# Patient Record
Sex: Female | Born: 1960 | Race: White | Hispanic: No | Smoking: Former smoker
Health system: Southern US, Community
[De-identification: ages and names within clinical notes are randomized; demographics above are authoritative.]

## PROBLEM LIST (undated history)

## (undated) DIAGNOSIS — Z5189 Encounter for other specified aftercare: Secondary | ICD-10-CM

## (undated) DIAGNOSIS — K449 Diaphragmatic hernia without obstruction or gangrene: Secondary | ICD-10-CM

## (undated) DIAGNOSIS — IMO0002 Reserved for concepts with insufficient information to code with codable children: Secondary | ICD-10-CM

## (undated) DIAGNOSIS — F329 Major depressive disorder, single episode, unspecified: Secondary | ICD-10-CM

## (undated) DIAGNOSIS — D689 Coagulation defect, unspecified: Secondary | ICD-10-CM

## (undated) DIAGNOSIS — J90 Pleural effusion, not elsewhere classified: Secondary | ICD-10-CM

## (undated) DIAGNOSIS — K219 Gastro-esophageal reflux disease without esophagitis: Secondary | ICD-10-CM

## (undated) DIAGNOSIS — IMO0001 Reserved for inherently not codable concepts without codable children: Secondary | ICD-10-CM

## (undated) DIAGNOSIS — M1990 Unspecified osteoarthritis, unspecified site: Secondary | ICD-10-CM

## (undated) DIAGNOSIS — K7460 Unspecified cirrhosis of liver: Secondary | ICD-10-CM

## (undated) DIAGNOSIS — R188 Other ascites: Secondary | ICD-10-CM

## (undated) DIAGNOSIS — K222 Esophageal obstruction: Secondary | ICD-10-CM

## (undated) DIAGNOSIS — F0280 Dementia in other diseases classified elsewhere without behavioral disturbance: Secondary | ICD-10-CM

## (undated) DIAGNOSIS — K7290 Hepatic failure, unspecified without coma: Secondary | ICD-10-CM

## (undated) DIAGNOSIS — B182 Chronic viral hepatitis C: Secondary | ICD-10-CM

## (undated) DIAGNOSIS — F1011 Alcohol abuse, in remission: Secondary | ICD-10-CM

## (undated) DIAGNOSIS — S0990XS Unspecified injury of head, sequela: Secondary | ICD-10-CM

## (undated) DIAGNOSIS — K7682 Hepatic encephalopathy: Secondary | ICD-10-CM

## (undated) DIAGNOSIS — I8500 Esophageal varices without bleeding: Secondary | ICD-10-CM

## (undated) DIAGNOSIS — I10 Essential (primary) hypertension: Secondary | ICD-10-CM

## (undated) DIAGNOSIS — Z8781 Personal history of (healed) traumatic fracture: Secondary | ICD-10-CM

## (undated) DIAGNOSIS — F4001 Agoraphobia with panic disorder: Secondary | ICD-10-CM

## (undated) DIAGNOSIS — F419 Anxiety disorder, unspecified: Secondary | ICD-10-CM

## (undated) HISTORY — DX: Unspecified injury of head, sequela: S09.90XS

## (undated) HISTORY — DX: Unspecified osteoarthritis, unspecified site: M19.90

## (undated) HISTORY — DX: Hepatic encephalopathy: K76.82

## (undated) HISTORY — DX: Diaphragmatic hernia without obstruction or gangrene: K44.9

## (undated) HISTORY — DX: Alcohol abuse, in remission: F10.11

## (undated) HISTORY — DX: Esophageal obstruction: K22.2

## (undated) HISTORY — DX: Reserved for concepts with insufficient information to code with codable children: IMO0002

## (undated) HISTORY — PX: NASAL SINUS SURGERY: SHX719

## (undated) HISTORY — DX: Agoraphobia with panic disorder: F40.01

## (undated) HISTORY — DX: Dementia in other diseases classified elsewhere without behavioral disturbance: F02.80

## (undated) HISTORY — DX: Hepatic failure, unspecified without coma: K72.90

## (undated) HISTORY — DX: Gastro-esophageal reflux disease without esophagitis: K21.9

## (undated) HISTORY — DX: Personal history of (healed) traumatic fracture: Z87.81

## (undated) HISTORY — DX: Encounter for other specified aftercare: Z51.89

## (undated) HISTORY — DX: Reserved for inherently not codable concepts without codable children: IMO0001

## (undated) HISTORY — DX: Essential (primary) hypertension: I10

## (undated) HISTORY — PX: SPLENECTOMY: SUR1306

## (undated) HISTORY — DX: Coagulation defect, unspecified: D68.9

---

## 1998-03-18 ENCOUNTER — Inpatient Hospital Stay (HOSPITAL_COMMUNITY): Admission: EM | Admit: 1998-03-18 | Discharge: 1998-03-23 | Payer: Self-pay | Admitting: Gastroenterology

## 2002-08-24 HISTORY — PX: BURR HOLE FOR SUBDURAL HEMATOMA: SHX1275

## 2002-12-11 ENCOUNTER — Emergency Department (HOSPITAL_COMMUNITY): Admission: EM | Admit: 2002-12-11 | Discharge: 2002-12-11 | Payer: Self-pay | Admitting: Emergency Medicine

## 2002-12-11 ENCOUNTER — Encounter: Payer: Self-pay | Admitting: Emergency Medicine

## 2002-12-25 ENCOUNTER — Inpatient Hospital Stay (HOSPITAL_COMMUNITY): Admission: EM | Admit: 2002-12-25 | Discharge: 2002-12-27 | Payer: Self-pay

## 2003-01-08 ENCOUNTER — Encounter: Admission: RE | Admit: 2003-01-08 | Discharge: 2003-01-08 | Payer: Self-pay | Admitting: Internal Medicine

## 2003-02-28 ENCOUNTER — Encounter: Admission: RE | Admit: 2003-02-28 | Discharge: 2003-02-28 | Payer: Self-pay | Admitting: Internal Medicine

## 2003-03-06 ENCOUNTER — Encounter: Admission: RE | Admit: 2003-03-06 | Discharge: 2003-03-06 | Payer: Self-pay | Admitting: Internal Medicine

## 2003-09-27 ENCOUNTER — Encounter: Admission: RE | Admit: 2003-09-27 | Discharge: 2003-09-27 | Payer: Self-pay | Admitting: Sports Medicine

## 2003-09-27 ENCOUNTER — Other Ambulatory Visit: Admission: RE | Admit: 2003-09-27 | Discharge: 2003-09-27 | Payer: Self-pay | Admitting: Family Medicine

## 2003-10-10 ENCOUNTER — Encounter: Admission: RE | Admit: 2003-10-10 | Discharge: 2003-10-10 | Payer: Self-pay | Admitting: Family Medicine

## 2003-10-16 ENCOUNTER — Ambulatory Visit (HOSPITAL_COMMUNITY): Admission: RE | Admit: 2003-10-16 | Discharge: 2003-10-16 | Payer: Self-pay | Admitting: Sports Medicine

## 2003-10-16 ENCOUNTER — Encounter: Admission: RE | Admit: 2003-10-16 | Discharge: 2003-10-16 | Payer: Self-pay | Admitting: Sports Medicine

## 2003-10-18 ENCOUNTER — Encounter: Admission: RE | Admit: 2003-10-18 | Discharge: 2003-10-18 | Payer: Self-pay | Admitting: Sports Medicine

## 2004-03-07 ENCOUNTER — Emergency Department (HOSPITAL_COMMUNITY): Admission: EM | Admit: 2004-03-07 | Discharge: 2004-03-07 | Payer: Self-pay | Admitting: Emergency Medicine

## 2004-03-27 ENCOUNTER — Emergency Department (HOSPITAL_COMMUNITY): Admission: EM | Admit: 2004-03-27 | Discharge: 2004-03-27 | Payer: Self-pay | Admitting: Emergency Medicine

## 2004-12-02 ENCOUNTER — Ambulatory Visit: Payer: Self-pay | Admitting: Family Medicine

## 2004-12-02 ENCOUNTER — Ambulatory Visit (HOSPITAL_COMMUNITY): Admission: RE | Admit: 2004-12-02 | Discharge: 2004-12-02 | Payer: Self-pay | Admitting: Internal Medicine

## 2004-12-11 ENCOUNTER — Ambulatory Visit: Payer: Self-pay | Admitting: Family Medicine

## 2004-12-25 ENCOUNTER — Ambulatory Visit: Payer: Self-pay | Admitting: Family Medicine

## 2005-01-19 ENCOUNTER — Inpatient Hospital Stay (HOSPITAL_COMMUNITY): Admission: EM | Admit: 2005-01-19 | Discharge: 2005-01-22 | Payer: Self-pay | Admitting: Emergency Medicine

## 2005-01-19 ENCOUNTER — Ambulatory Visit: Payer: Self-pay | Admitting: Family Medicine

## 2005-02-18 ENCOUNTER — Ambulatory Visit: Payer: Self-pay | Admitting: Family Medicine

## 2005-02-23 ENCOUNTER — Ambulatory Visit: Payer: Self-pay | Admitting: Family Medicine

## 2005-03-20 ENCOUNTER — Ambulatory Visit: Payer: Self-pay | Admitting: Family Medicine

## 2005-03-25 ENCOUNTER — Encounter: Admission: RE | Admit: 2005-03-25 | Discharge: 2005-03-25 | Payer: Self-pay | Admitting: Family Medicine

## 2005-04-13 ENCOUNTER — Ambulatory Visit: Payer: Self-pay | Admitting: Family Medicine

## 2005-05-26 ENCOUNTER — Ambulatory Visit: Payer: Self-pay | Admitting: Sports Medicine

## 2005-06-10 ENCOUNTER — Ambulatory Visit: Payer: Self-pay | Admitting: Family Medicine

## 2005-07-14 ENCOUNTER — Ambulatory Visit: Payer: Self-pay | Admitting: Family Medicine

## 2005-08-21 ENCOUNTER — Ambulatory Visit: Payer: Self-pay | Admitting: Family Medicine

## 2005-09-30 ENCOUNTER — Encounter (INDEPENDENT_AMBULATORY_CARE_PROVIDER_SITE_OTHER): Payer: Self-pay | Admitting: *Deleted

## 2005-09-30 ENCOUNTER — Ambulatory Visit: Payer: Self-pay | Admitting: Family Medicine

## 2005-09-30 LAB — CONVERTED CEMR LAB

## 2005-10-05 ENCOUNTER — Ambulatory Visit: Payer: Self-pay | Admitting: Family Medicine

## 2005-10-20 ENCOUNTER — Ambulatory Visit: Payer: Self-pay | Admitting: Family Medicine

## 2005-10-21 ENCOUNTER — Encounter: Admission: RE | Admit: 2005-10-21 | Discharge: 2005-10-21 | Payer: Self-pay | Admitting: Sports Medicine

## 2005-11-18 ENCOUNTER — Ambulatory Visit: Payer: Self-pay | Admitting: Family Medicine

## 2005-12-18 ENCOUNTER — Ambulatory Visit: Payer: Self-pay | Admitting: Family Medicine

## 2005-12-24 ENCOUNTER — Ambulatory Visit: Payer: Self-pay | Admitting: Family Medicine

## 2005-12-31 ENCOUNTER — Ambulatory Visit: Payer: Self-pay | Admitting: Psychology

## 2006-01-15 ENCOUNTER — Ambulatory Visit: Payer: Self-pay | Admitting: Family Medicine

## 2006-02-02 ENCOUNTER — Ambulatory Visit: Payer: Self-pay | Admitting: Internal Medicine

## 2006-02-04 ENCOUNTER — Ambulatory Visit: Payer: Self-pay | Admitting: Internal Medicine

## 2006-02-16 ENCOUNTER — Ambulatory Visit: Payer: Self-pay | Admitting: Family Medicine

## 2006-03-02 ENCOUNTER — Ambulatory Visit: Payer: Self-pay | Admitting: Sports Medicine

## 2006-03-09 ENCOUNTER — Encounter (INDEPENDENT_AMBULATORY_CARE_PROVIDER_SITE_OTHER): Payer: Self-pay | Admitting: Specialist

## 2006-03-09 ENCOUNTER — Ambulatory Visit (HOSPITAL_COMMUNITY): Admission: RE | Admit: 2006-03-09 | Discharge: 2006-03-09 | Payer: Self-pay | Admitting: Internal Medicine

## 2006-03-11 ENCOUNTER — Ambulatory Visit: Payer: Self-pay | Admitting: Internal Medicine

## 2006-03-23 ENCOUNTER — Ambulatory Visit: Payer: Self-pay | Admitting: Family Medicine

## 2006-03-25 ENCOUNTER — Emergency Department (HOSPITAL_COMMUNITY): Admission: EM | Admit: 2006-03-25 | Discharge: 2006-03-25 | Payer: Self-pay | Admitting: Emergency Medicine

## 2006-03-31 ENCOUNTER — Ambulatory Visit: Payer: Self-pay | Admitting: Sports Medicine

## 2006-04-06 ENCOUNTER — Ambulatory Visit: Payer: Self-pay | Admitting: Internal Medicine

## 2006-05-14 ENCOUNTER — Ambulatory Visit: Payer: Self-pay | Admitting: Family Medicine

## 2006-06-18 ENCOUNTER — Ambulatory Visit: Payer: Self-pay | Admitting: Family Medicine

## 2006-06-23 ENCOUNTER — Ambulatory Visit: Payer: Self-pay | Admitting: Family Medicine

## 2006-07-26 ENCOUNTER — Ambulatory Visit: Payer: Self-pay | Admitting: Sports Medicine

## 2006-08-06 ENCOUNTER — Ambulatory Visit: Payer: Self-pay

## 2006-09-03 ENCOUNTER — Encounter (INDEPENDENT_AMBULATORY_CARE_PROVIDER_SITE_OTHER): Payer: Self-pay | Admitting: Family Medicine

## 2006-09-03 ENCOUNTER — Ambulatory Visit: Payer: Self-pay | Admitting: Family Medicine

## 2006-10-21 DIAGNOSIS — D696 Thrombocytopenia, unspecified: Secondary | ICD-10-CM

## 2006-10-21 DIAGNOSIS — F331 Major depressive disorder, recurrent, moderate: Secondary | ICD-10-CM

## 2006-10-21 DIAGNOSIS — I10 Essential (primary) hypertension: Secondary | ICD-10-CM | POA: Insufficient documentation

## 2006-10-21 DIAGNOSIS — L299 Pruritus, unspecified: Secondary | ICD-10-CM | POA: Insufficient documentation

## 2006-10-21 DIAGNOSIS — K7460 Unspecified cirrhosis of liver: Secondary | ICD-10-CM

## 2006-10-22 ENCOUNTER — Encounter (INDEPENDENT_AMBULATORY_CARE_PROVIDER_SITE_OTHER): Payer: Self-pay | Admitting: *Deleted

## 2006-10-29 ENCOUNTER — Telehealth (INDEPENDENT_AMBULATORY_CARE_PROVIDER_SITE_OTHER): Payer: Self-pay | Admitting: Family Medicine

## 2006-11-02 ENCOUNTER — Ambulatory Visit: Payer: Self-pay | Admitting: Family Medicine

## 2006-11-02 ENCOUNTER — Encounter (INDEPENDENT_AMBULATORY_CARE_PROVIDER_SITE_OTHER): Payer: Self-pay | Admitting: Family Medicine

## 2006-11-02 LAB — CONVERTED CEMR LAB: Alcohol, Ethyl (B): 9 mg/dL (ref 0–10)

## 2006-11-29 ENCOUNTER — Encounter: Payer: Self-pay | Admitting: Family Medicine

## 2006-12-02 ENCOUNTER — Ambulatory Visit: Payer: Self-pay | Admitting: Family Medicine

## 2006-12-02 ENCOUNTER — Encounter (INDEPENDENT_AMBULATORY_CARE_PROVIDER_SITE_OTHER): Payer: Self-pay | Admitting: Family Medicine

## 2006-12-02 LAB — CONVERTED CEMR LAB: Alcohol, Ethyl (B): 11 mg/dL — ABNORMAL HIGH (ref 0–10)

## 2006-12-07 ENCOUNTER — Ambulatory Visit: Payer: Self-pay | Admitting: Family Medicine

## 2006-12-07 ENCOUNTER — Encounter (INDEPENDENT_AMBULATORY_CARE_PROVIDER_SITE_OTHER): Payer: Self-pay | Admitting: Family Medicine

## 2006-12-07 LAB — CONVERTED CEMR LAB
Amphetamine Screen, Ur: NEGATIVE
Cocaine Metabolites: NEGATIVE
Creatinine,U: 87 mg/dL
Phencyclidine (PCP): NEGATIVE

## 2006-12-11 ENCOUNTER — Encounter (INDEPENDENT_AMBULATORY_CARE_PROVIDER_SITE_OTHER): Payer: Self-pay | Admitting: Family Medicine

## 2007-01-06 ENCOUNTER — Encounter (INDEPENDENT_AMBULATORY_CARE_PROVIDER_SITE_OTHER): Payer: Self-pay | Admitting: *Deleted

## 2007-01-11 ENCOUNTER — Ambulatory Visit: Payer: Self-pay | Admitting: Family Medicine

## 2007-01-11 ENCOUNTER — Encounter (INDEPENDENT_AMBULATORY_CARE_PROVIDER_SITE_OTHER): Payer: Self-pay | Admitting: Family Medicine

## 2007-01-11 LAB — CONVERTED CEMR LAB
Amphetamine Screen, Ur: NEGATIVE
Benzodiazepines.: NEGATIVE
Cocaine Metabolites: NEGATIVE
Phencyclidine (PCP): NEGATIVE

## 2007-02-03 ENCOUNTER — Telehealth (INDEPENDENT_AMBULATORY_CARE_PROVIDER_SITE_OTHER): Payer: Self-pay | Admitting: Family Medicine

## 2007-02-07 ENCOUNTER — Ambulatory Visit: Payer: Self-pay | Admitting: Family Medicine

## 2007-02-07 LAB — CONVERTED CEMR LAB
Bilirubin Urine: NEGATIVE
Blood in Urine, dipstick: NEGATIVE
Glucose, Urine, Semiquant: NEGATIVE
Ketones, urine, test strip: NEGATIVE
Protein, U semiquant: NEGATIVE
pH: 7

## 2007-03-04 ENCOUNTER — Telehealth: Payer: Self-pay | Admitting: *Deleted

## 2007-03-08 ENCOUNTER — Telehealth: Payer: Self-pay | Admitting: *Deleted

## 2007-03-11 ENCOUNTER — Ambulatory Visit: Payer: Self-pay | Admitting: Family Medicine

## 2007-03-11 ENCOUNTER — Encounter: Payer: Self-pay | Admitting: Family Medicine

## 2007-03-11 LAB — CONVERTED CEMR LAB
ALT: 27 units/L (ref 0–35)
Amphetamine Screen, Ur: NEGATIVE
Benzodiazepines.: NEGATIVE
CO2: 23 meq/L (ref 19–32)
Calcium: 8.8 mg/dL (ref 8.4–10.5)
Chloride: 103 meq/L (ref 96–112)
Creatinine, Ser: 0.78 mg/dL (ref 0.40–1.20)
Glucose, Bld: 45 mg/dL — ABNORMAL LOW (ref 70–99)
HCT: 36.4 % (ref 36.0–46.0)
Hemoglobin: 12.6 g/dL (ref 12.0–15.0)
Methadone: NEGATIVE
Platelets: 224 10*3/uL (ref 150–400)
Propoxyphene: NEGATIVE
RBC: 3.37 M/uL — ABNORMAL LOW (ref 3.87–5.11)
WBC: 9.7 10*3/uL (ref 4.0–10.5)

## 2007-04-07 ENCOUNTER — Telehealth (INDEPENDENT_AMBULATORY_CARE_PROVIDER_SITE_OTHER): Payer: Self-pay | Admitting: *Deleted

## 2007-04-08 ENCOUNTER — Encounter (INDEPENDENT_AMBULATORY_CARE_PROVIDER_SITE_OTHER): Payer: Self-pay | Admitting: Family Medicine

## 2007-04-08 ENCOUNTER — Ambulatory Visit: Payer: Self-pay | Admitting: Family Medicine

## 2007-04-08 LAB — CONVERTED CEMR LAB
Barbiturate Quant, Ur: NEGATIVE
Benzodiazepines.: NEGATIVE
Methadone: NEGATIVE
Opiate Screen, Urine: NEGATIVE
Phencyclidine (PCP): NEGATIVE

## 2007-04-14 ENCOUNTER — Telehealth (INDEPENDENT_AMBULATORY_CARE_PROVIDER_SITE_OTHER): Payer: Self-pay | Admitting: Family Medicine

## 2007-04-22 ENCOUNTER — Telehealth: Payer: Self-pay | Admitting: *Deleted

## 2007-05-12 ENCOUNTER — Encounter (INDEPENDENT_AMBULATORY_CARE_PROVIDER_SITE_OTHER): Payer: Self-pay | Admitting: Family Medicine

## 2007-05-12 ENCOUNTER — Ambulatory Visit: Payer: Self-pay | Admitting: Family Medicine

## 2007-05-12 LAB — CONVERTED CEMR LAB
ALT: 24 units/L (ref 0–35)
Ammonia: 25 umol/L (ref 11–35)
Amphetamine Screen, Ur: NEGATIVE
BUN: 13 mg/dL (ref 6–23)
Barbiturate Quant, Ur: NEGATIVE
Bilirubin, Direct: 0.4 mg/dL — ABNORMAL HIGH (ref 0.0–0.3)
Calcium: 8.3 mg/dL — ABNORMAL LOW (ref 8.4–10.5)
Chloride: 102 meq/L (ref 96–112)
Cocaine Metabolites: NEGATIVE
Creatinine, Ser: 0.81 mg/dL (ref 0.40–1.20)
Folate: 18.6 ng/mL
HCT: 35.9 % — ABNORMAL LOW (ref 36.0–46.0)
Indirect Bilirubin: 0.4 mg/dL (ref 0.0–0.9)
MCHC: 34.5 g/dL (ref 30.0–36.0)
MCV: 107.8 fL — ABNORMAL HIGH (ref 78.0–100.0)
Marijuana Metabolite: NEGATIVE
Methadone: NEGATIVE
Opiate Screen, Urine: NEGATIVE
Platelets: 191 10*3/uL (ref 150–400)
RDW: 15.6 % — ABNORMAL HIGH (ref 11.5–14.0)
Sed Rate: 14 mm/hr (ref 0–22)
TSH: 9.02 microintl units/mL — ABNORMAL HIGH (ref 0.350–5.50)
Vitamin B-12: 1168 pg/mL — ABNORMAL HIGH (ref 211–911)
WBC: 8.9 10*3/uL (ref 4.0–10.5)

## 2007-05-18 ENCOUNTER — Encounter (INDEPENDENT_AMBULATORY_CARE_PROVIDER_SITE_OTHER): Payer: Self-pay | Admitting: Family Medicine

## 2007-05-25 ENCOUNTER — Encounter (INDEPENDENT_AMBULATORY_CARE_PROVIDER_SITE_OTHER): Payer: Self-pay | Admitting: Family Medicine

## 2007-06-03 ENCOUNTER — Telehealth (INDEPENDENT_AMBULATORY_CARE_PROVIDER_SITE_OTHER): Payer: Self-pay | Admitting: *Deleted

## 2007-06-06 ENCOUNTER — Encounter (INDEPENDENT_AMBULATORY_CARE_PROVIDER_SITE_OTHER): Payer: Self-pay | Admitting: Family Medicine

## 2007-06-12 ENCOUNTER — Encounter (INDEPENDENT_AMBULATORY_CARE_PROVIDER_SITE_OTHER): Payer: Self-pay | Admitting: Family Medicine

## 2007-06-13 ENCOUNTER — Ambulatory Visit: Payer: Self-pay | Admitting: Family Medicine

## 2007-06-13 ENCOUNTER — Encounter (INDEPENDENT_AMBULATORY_CARE_PROVIDER_SITE_OTHER): Payer: Self-pay | Admitting: Family Medicine

## 2007-06-13 LAB — CONVERTED CEMR LAB: TSH: 3.551 microintl units/mL (ref 0.350–5.50)

## 2007-06-16 ENCOUNTER — Telehealth (INDEPENDENT_AMBULATORY_CARE_PROVIDER_SITE_OTHER): Payer: Self-pay | Admitting: Family Medicine

## 2007-06-29 ENCOUNTER — Telehealth (INDEPENDENT_AMBULATORY_CARE_PROVIDER_SITE_OTHER): Payer: Self-pay | Admitting: *Deleted

## 2007-07-06 ENCOUNTER — Encounter (INDEPENDENT_AMBULATORY_CARE_PROVIDER_SITE_OTHER): Payer: Self-pay | Admitting: Family Medicine

## 2007-07-06 ENCOUNTER — Ambulatory Visit: Payer: Self-pay | Admitting: Family Medicine

## 2007-07-12 LAB — CONVERTED CEMR LAB
ALT: 27 units/L (ref 0–35)
AST: 36 units/L (ref 0–37)
BUN: 14 mg/dL (ref 6–23)
CO2: 24 meq/L (ref 19–32)
Chloride: 109 meq/L (ref 96–112)
Creatinine, Ser: 0.76 mg/dL (ref 0.40–1.20)
Glucose, Bld: 85 mg/dL (ref 70–99)
Potassium: 4.5 meq/L (ref 3.5–5.3)
Total Protein: 6.6 g/dL (ref 6.0–8.3)

## 2007-07-20 ENCOUNTER — Telehealth (INDEPENDENT_AMBULATORY_CARE_PROVIDER_SITE_OTHER): Payer: Self-pay | Admitting: Family Medicine

## 2007-08-03 ENCOUNTER — Ambulatory Visit: Payer: Self-pay | Admitting: Internal Medicine

## 2007-08-04 ENCOUNTER — Ambulatory Visit (HOSPITAL_COMMUNITY): Admission: RE | Admit: 2007-08-04 | Discharge: 2007-08-04 | Payer: Self-pay | Admitting: Internal Medicine

## 2007-08-05 ENCOUNTER — Encounter: Payer: Self-pay | Admitting: Internal Medicine

## 2007-08-05 ENCOUNTER — Ambulatory Visit: Payer: Self-pay | Admitting: Internal Medicine

## 2007-08-05 DIAGNOSIS — K222 Esophageal obstruction: Secondary | ICD-10-CM

## 2007-08-05 HISTORY — PX: UPPER GASTROINTESTINAL ENDOSCOPY: SHX188

## 2007-08-09 ENCOUNTER — Ambulatory Visit: Payer: Self-pay | Admitting: Family Medicine

## 2007-08-10 ENCOUNTER — Telehealth (INDEPENDENT_AMBULATORY_CARE_PROVIDER_SITE_OTHER): Payer: Self-pay | Admitting: *Deleted

## 2007-08-23 ENCOUNTER — Encounter: Admission: RE | Admit: 2007-08-23 | Discharge: 2007-08-24 | Payer: Self-pay | Admitting: Family Medicine

## 2007-08-25 ENCOUNTER — Encounter: Admission: RE | Admit: 2007-08-25 | Discharge: 2007-10-05 | Payer: Self-pay | Admitting: Family Medicine

## 2007-09-02 ENCOUNTER — Encounter (INDEPENDENT_AMBULATORY_CARE_PROVIDER_SITE_OTHER): Payer: Self-pay | Admitting: Family Medicine

## 2007-09-02 ENCOUNTER — Ambulatory Visit: Payer: Self-pay | Admitting: Family Medicine

## 2007-10-11 ENCOUNTER — Ambulatory Visit: Payer: Self-pay | Admitting: Family Medicine

## 2007-10-11 ENCOUNTER — Encounter (INDEPENDENT_AMBULATORY_CARE_PROVIDER_SITE_OTHER): Payer: Self-pay | Admitting: Family Medicine

## 2007-10-11 ENCOUNTER — Other Ambulatory Visit: Admission: RE | Admit: 2007-10-11 | Discharge: 2007-10-11 | Payer: Self-pay | Admitting: Family Medicine

## 2007-10-18 ENCOUNTER — Encounter (INDEPENDENT_AMBULATORY_CARE_PROVIDER_SITE_OTHER): Payer: Self-pay | Admitting: Family Medicine

## 2007-11-14 ENCOUNTER — Encounter (INDEPENDENT_AMBULATORY_CARE_PROVIDER_SITE_OTHER): Payer: Self-pay | Admitting: *Deleted

## 2007-11-25 ENCOUNTER — Ambulatory Visit: Payer: Self-pay | Admitting: Family Medicine

## 2007-12-05 ENCOUNTER — Telehealth (INDEPENDENT_AMBULATORY_CARE_PROVIDER_SITE_OTHER): Payer: Self-pay | Admitting: Family Medicine

## 2007-12-14 ENCOUNTER — Ambulatory Visit: Payer: Self-pay | Admitting: Family Medicine

## 2007-12-20 ENCOUNTER — Encounter: Admission: RE | Admit: 2007-12-20 | Discharge: 2008-03-19 | Payer: Self-pay | Admitting: Family Medicine

## 2007-12-26 ENCOUNTER — Telehealth: Payer: Self-pay | Admitting: *Deleted

## 2007-12-29 ENCOUNTER — Encounter: Payer: Self-pay | Admitting: Internal Medicine

## 2008-01-02 ENCOUNTER — Telehealth: Payer: Self-pay | Admitting: *Deleted

## 2008-01-02 ENCOUNTER — Encounter: Admission: RE | Admit: 2008-01-02 | Discharge: 2008-01-02 | Payer: Self-pay | Admitting: Sports Medicine

## 2008-01-02 ENCOUNTER — Ambulatory Visit: Payer: Self-pay

## 2008-01-05 ENCOUNTER — Telehealth (INDEPENDENT_AMBULATORY_CARE_PROVIDER_SITE_OTHER): Payer: Self-pay | Admitting: Family Medicine

## 2008-01-06 ENCOUNTER — Telehealth: Payer: Self-pay | Admitting: *Deleted

## 2008-01-13 ENCOUNTER — Ambulatory Visit: Payer: Self-pay | Admitting: Family Medicine

## 2008-01-13 ENCOUNTER — Encounter: Payer: Self-pay | Admitting: *Deleted

## 2008-01-15 ENCOUNTER — Emergency Department (HOSPITAL_COMMUNITY): Admission: EM | Admit: 2008-01-15 | Discharge: 2008-01-15 | Payer: Self-pay | Admitting: Family Medicine

## 2008-01-31 ENCOUNTER — Encounter (INDEPENDENT_AMBULATORY_CARE_PROVIDER_SITE_OTHER): Payer: Self-pay | Admitting: Family Medicine

## 2008-01-31 ENCOUNTER — Telehealth: Payer: Self-pay | Admitting: *Deleted

## 2008-01-31 LAB — CONVERTED CEMR LAB
Cholesterol: 110 mg/dL
Glucose, Bld: 90 mg/dL
HDL: 17 mg/dL
Potassium: 4.3 meq/L
Sodium: 140 meq/L
Triglycerides: 99 mg/dL
VLDL: 20 mg/dL

## 2008-02-01 ENCOUNTER — Ambulatory Visit: Payer: Self-pay | Admitting: Family Medicine

## 2008-02-06 ENCOUNTER — Telehealth: Payer: Self-pay | Admitting: *Deleted

## 2008-02-07 ENCOUNTER — Encounter (INDEPENDENT_AMBULATORY_CARE_PROVIDER_SITE_OTHER): Payer: Self-pay | Admitting: Family Medicine

## 2008-02-09 ENCOUNTER — Telehealth: Payer: Self-pay | Admitting: *Deleted

## 2008-02-10 ENCOUNTER — Telehealth: Payer: Self-pay | Admitting: *Deleted

## 2008-02-10 ENCOUNTER — Ambulatory Visit: Payer: Self-pay | Admitting: Family Medicine

## 2008-02-10 ENCOUNTER — Encounter (INDEPENDENT_AMBULATORY_CARE_PROVIDER_SITE_OTHER): Payer: Self-pay | Admitting: Family Medicine

## 2008-02-10 LAB — CONVERTED CEMR LAB
ALT: 39 units/L — ABNORMAL HIGH (ref 0–35)
AST: 48 units/L — ABNORMAL HIGH (ref 0–37)
Albumin: 2.9 g/dL — ABNORMAL LOW (ref 3.5–5.2)
CO2: 21 meq/L (ref 19–32)
Calcium: 8.6 mg/dL (ref 8.4–10.5)
Chloride: 111 meq/L (ref 96–112)
Glucose, Urine, Semiquant: NEGATIVE
Nitrite: NEGATIVE
Potassium: 4.6 meq/L (ref 3.5–5.3)
Protein, U semiquant: NEGATIVE
Specific Gravity, Urine: 1.02
Total Protein: 6.8 g/dL (ref 6.0–8.3)
WBC Urine, dipstick: NEGATIVE
pH: 6.5

## 2008-02-13 ENCOUNTER — Telehealth: Payer: Self-pay | Admitting: *Deleted

## 2008-02-14 ENCOUNTER — Ambulatory Visit: Payer: Self-pay | Admitting: Sports Medicine

## 2008-02-16 ENCOUNTER — Ambulatory Visit: Payer: Self-pay | Admitting: Family Medicine

## 2008-02-28 ENCOUNTER — Encounter (INDEPENDENT_AMBULATORY_CARE_PROVIDER_SITE_OTHER): Payer: Self-pay | Admitting: Family Medicine

## 2008-02-29 ENCOUNTER — Telehealth (INDEPENDENT_AMBULATORY_CARE_PROVIDER_SITE_OTHER): Payer: Self-pay | Admitting: *Deleted

## 2008-03-12 ENCOUNTER — Ambulatory Visit: Payer: Self-pay | Admitting: Family Medicine

## 2008-03-12 ENCOUNTER — Encounter (INDEPENDENT_AMBULATORY_CARE_PROVIDER_SITE_OTHER): Payer: Self-pay | Admitting: Family Medicine

## 2008-03-13 ENCOUNTER — Encounter (INDEPENDENT_AMBULATORY_CARE_PROVIDER_SITE_OTHER): Payer: Self-pay | Admitting: Family Medicine

## 2008-03-13 LAB — CONVERTED CEMR LAB: Ammonia: 44 umol/L — ABNORMAL HIGH (ref 11–35)

## 2008-03-14 ENCOUNTER — Telehealth (INDEPENDENT_AMBULATORY_CARE_PROVIDER_SITE_OTHER): Payer: Self-pay | Admitting: Family Medicine

## 2008-03-19 ENCOUNTER — Telehealth (INDEPENDENT_AMBULATORY_CARE_PROVIDER_SITE_OTHER): Payer: Self-pay | Admitting: Family Medicine

## 2008-03-21 ENCOUNTER — Telehealth (INDEPENDENT_AMBULATORY_CARE_PROVIDER_SITE_OTHER): Payer: Self-pay | Admitting: Family Medicine

## 2008-03-21 ENCOUNTER — Encounter (INDEPENDENT_AMBULATORY_CARE_PROVIDER_SITE_OTHER): Payer: Self-pay | Admitting: Family Medicine

## 2008-03-25 ENCOUNTER — Inpatient Hospital Stay (HOSPITAL_COMMUNITY): Admission: EM | Admit: 2008-03-25 | Discharge: 2008-04-02 | Payer: Self-pay | Admitting: Emergency Medicine

## 2008-03-28 ENCOUNTER — Ambulatory Visit: Payer: Self-pay | Admitting: Physical Medicine & Rehabilitation

## 2008-03-28 ENCOUNTER — Telehealth (INDEPENDENT_AMBULATORY_CARE_PROVIDER_SITE_OTHER): Payer: Self-pay | Admitting: Family Medicine

## 2008-04-04 ENCOUNTER — Telehealth (INDEPENDENT_AMBULATORY_CARE_PROVIDER_SITE_OTHER): Payer: Self-pay | Admitting: *Deleted

## 2008-04-05 ENCOUNTER — Encounter: Payer: Self-pay | Admitting: Family Medicine

## 2008-04-06 ENCOUNTER — Telehealth: Payer: Self-pay | Admitting: *Deleted

## 2008-04-11 ENCOUNTER — Telehealth (INDEPENDENT_AMBULATORY_CARE_PROVIDER_SITE_OTHER): Payer: Self-pay | Admitting: *Deleted

## 2008-04-12 ENCOUNTER — Telehealth (INDEPENDENT_AMBULATORY_CARE_PROVIDER_SITE_OTHER): Payer: Self-pay | Admitting: Family Medicine

## 2008-04-18 ENCOUNTER — Telehealth: Payer: Self-pay | Admitting: *Deleted

## 2008-04-19 ENCOUNTER — Telehealth: Payer: Self-pay | Admitting: *Deleted

## 2008-04-20 ENCOUNTER — Encounter (INDEPENDENT_AMBULATORY_CARE_PROVIDER_SITE_OTHER): Payer: Self-pay | Admitting: Family Medicine

## 2008-04-20 DIAGNOSIS — S72009A Fracture of unspecified part of neck of unspecified femur, initial encounter for closed fracture: Secondary | ICD-10-CM | POA: Insufficient documentation

## 2008-04-26 ENCOUNTER — Ambulatory Visit: Payer: Self-pay | Admitting: Family Medicine

## 2008-04-26 ENCOUNTER — Encounter (INDEPENDENT_AMBULATORY_CARE_PROVIDER_SITE_OTHER): Payer: Self-pay | Admitting: Family Medicine

## 2008-04-26 LAB — CONVERTED CEMR LAB
Barbiturate Quant, Ur: NEGATIVE
Creatinine,U: 25.1 mg/dL
Ethyl Alcohol: <10 mg/dL (ref <10)
Methadone: NEGATIVE
Opiate Screen, Urine: NEGATIVE
Propoxyphene: NEGATIVE

## 2008-05-14 ENCOUNTER — Encounter (INDEPENDENT_AMBULATORY_CARE_PROVIDER_SITE_OTHER): Payer: Self-pay | Admitting: Family Medicine

## 2008-06-05 ENCOUNTER — Encounter: Admission: RE | Admit: 2008-06-05 | Discharge: 2008-07-23 | Payer: Self-pay | Admitting: Orthopedic Surgery

## 2008-06-12 ENCOUNTER — Encounter (INDEPENDENT_AMBULATORY_CARE_PROVIDER_SITE_OTHER): Payer: Self-pay | Admitting: Family Medicine

## 2008-06-12 ENCOUNTER — Ambulatory Visit: Payer: Self-pay | Admitting: Family Medicine

## 2008-07-11 ENCOUNTER — Ambulatory Visit: Payer: Self-pay | Admitting: Family Medicine

## 2008-07-13 ENCOUNTER — Telehealth (INDEPENDENT_AMBULATORY_CARE_PROVIDER_SITE_OTHER): Payer: Self-pay | Admitting: *Deleted

## 2008-07-16 ENCOUNTER — Telehealth (INDEPENDENT_AMBULATORY_CARE_PROVIDER_SITE_OTHER): Payer: Self-pay | Admitting: *Deleted

## 2008-07-27 ENCOUNTER — Ambulatory Visit: Payer: Self-pay | Admitting: Family Medicine

## 2008-07-27 ENCOUNTER — Encounter (INDEPENDENT_AMBULATORY_CARE_PROVIDER_SITE_OTHER): Payer: Self-pay | Admitting: Family Medicine

## 2008-08-13 ENCOUNTER — Telehealth (INDEPENDENT_AMBULATORY_CARE_PROVIDER_SITE_OTHER): Payer: Self-pay | Admitting: Family Medicine

## 2008-08-19 ENCOUNTER — Encounter: Payer: Self-pay | Admitting: Internal Medicine

## 2008-08-21 ENCOUNTER — Encounter (INDEPENDENT_AMBULATORY_CARE_PROVIDER_SITE_OTHER): Payer: Self-pay | Admitting: Family Medicine

## 2008-08-24 HISTORY — PX: ORIF HIP FRACTURE: SHX2125

## 2008-08-27 ENCOUNTER — Encounter (INDEPENDENT_AMBULATORY_CARE_PROVIDER_SITE_OTHER): Payer: Self-pay | Admitting: Family Medicine

## 2008-08-29 ENCOUNTER — Encounter (INDEPENDENT_AMBULATORY_CARE_PROVIDER_SITE_OTHER): Payer: Self-pay | Admitting: Family Medicine

## 2008-09-03 ENCOUNTER — Telehealth: Payer: Self-pay | Admitting: *Deleted

## 2008-09-04 ENCOUNTER — Encounter: Admission: RE | Admit: 2008-09-04 | Discharge: 2008-09-04 | Payer: Self-pay | Admitting: Orthopedic Surgery

## 2008-09-11 ENCOUNTER — Telehealth (INDEPENDENT_AMBULATORY_CARE_PROVIDER_SITE_OTHER): Payer: Self-pay | Admitting: *Deleted

## 2008-09-17 ENCOUNTER — Telehealth: Payer: Self-pay | Admitting: *Deleted

## 2008-09-19 ENCOUNTER — Telehealth: Payer: Self-pay | Admitting: *Deleted

## 2008-09-21 ENCOUNTER — Encounter: Payer: Self-pay | Admitting: *Deleted

## 2008-10-03 ENCOUNTER — Encounter (INDEPENDENT_AMBULATORY_CARE_PROVIDER_SITE_OTHER): Payer: Self-pay | Admitting: Family Medicine

## 2008-10-12 ENCOUNTER — Encounter (INDEPENDENT_AMBULATORY_CARE_PROVIDER_SITE_OTHER): Payer: Self-pay | Admitting: Family Medicine

## 2008-10-12 ENCOUNTER — Ambulatory Visit: Payer: Self-pay | Admitting: Family Medicine

## 2008-10-12 LAB — CONVERTED CEMR LAB
ALT: 28 units/L (ref 0–35)
AST: 40 units/L — ABNORMAL HIGH (ref 0–37)
Basophils Absolute: 0 10*3/uL (ref 0.0–0.1)
Basophils Relative: 0 % (ref 0–1)
Calcium: 8.8 mg/dL (ref 8.4–10.5)
Chloride: 108 meq/L (ref 96–112)
Creatinine, Ser: 0.72 mg/dL (ref 0.40–1.20)
Hemoglobin: 11.7 g/dL — ABNORMAL LOW (ref 12.0–15.0)
MCHC: 33.2 g/dL (ref 30.0–36.0)
Monocytes Absolute: 1.1 10*3/uL — ABNORMAL HIGH (ref 0.1–1.0)
Neutro Abs: 2.7 10*3/uL (ref 1.7–7.7)
Neutrophils Relative %: 37 % — ABNORMAL LOW (ref 43–77)
Platelets: 178 10*3/uL (ref 150–400)
RDW: 14.9 % (ref 11.5–15.5)
Sodium: 137 meq/L (ref 135–145)
Total Protein: 6.4 g/dL (ref 6.0–8.3)

## 2008-10-15 ENCOUNTER — Telehealth (INDEPENDENT_AMBULATORY_CARE_PROVIDER_SITE_OTHER): Payer: Self-pay | Admitting: Family Medicine

## 2008-11-01 ENCOUNTER — Encounter (INDEPENDENT_AMBULATORY_CARE_PROVIDER_SITE_OTHER): Payer: Self-pay | Admitting: Family Medicine

## 2008-11-14 ENCOUNTER — Other Ambulatory Visit: Admission: RE | Admit: 2008-11-14 | Discharge: 2008-11-14 | Payer: Self-pay | Admitting: Family Medicine

## 2008-11-14 ENCOUNTER — Ambulatory Visit: Payer: Self-pay | Admitting: Family Medicine

## 2008-11-14 ENCOUNTER — Encounter (INDEPENDENT_AMBULATORY_CARE_PROVIDER_SITE_OTHER): Payer: Self-pay | Admitting: Family Medicine

## 2008-11-15 LAB — CONVERTED CEMR LAB
Alcohol, Ethyl (B): <10 mg/dL (ref 0–10)
Free T4: 0.78 ng/dL — ABNORMAL LOW (ref 0.89–1.80)

## 2008-11-19 ENCOUNTER — Encounter (INDEPENDENT_AMBULATORY_CARE_PROVIDER_SITE_OTHER): Payer: Self-pay | Admitting: Family Medicine

## 2008-12-05 ENCOUNTER — Encounter (INDEPENDENT_AMBULATORY_CARE_PROVIDER_SITE_OTHER): Payer: Self-pay | Admitting: Family Medicine

## 2008-12-14 ENCOUNTER — Telehealth (INDEPENDENT_AMBULATORY_CARE_PROVIDER_SITE_OTHER): Payer: Self-pay | Admitting: Family Medicine

## 2008-12-17 ENCOUNTER — Telehealth (INDEPENDENT_AMBULATORY_CARE_PROVIDER_SITE_OTHER): Payer: Self-pay | Admitting: Family Medicine

## 2008-12-18 ENCOUNTER — Encounter (INDEPENDENT_AMBULATORY_CARE_PROVIDER_SITE_OTHER): Payer: Self-pay | Admitting: Family Medicine

## 2008-12-20 ENCOUNTER — Encounter (INDEPENDENT_AMBULATORY_CARE_PROVIDER_SITE_OTHER): Payer: Self-pay | Admitting: Family Medicine

## 2008-12-20 ENCOUNTER — Encounter: Payer: Self-pay | Admitting: Internal Medicine

## 2009-01-11 ENCOUNTER — Telehealth (INDEPENDENT_AMBULATORY_CARE_PROVIDER_SITE_OTHER): Payer: Self-pay | Admitting: Family Medicine

## 2009-01-23 ENCOUNTER — Encounter (INDEPENDENT_AMBULATORY_CARE_PROVIDER_SITE_OTHER): Payer: Self-pay | Admitting: Family Medicine

## 2009-01-24 ENCOUNTER — Telehealth (INDEPENDENT_AMBULATORY_CARE_PROVIDER_SITE_OTHER): Payer: Self-pay | Admitting: Family Medicine

## 2009-01-25 ENCOUNTER — Encounter: Admission: RE | Admit: 2009-01-25 | Discharge: 2009-01-25 | Payer: Self-pay | Admitting: Orthopedic Surgery

## 2009-02-04 ENCOUNTER — Encounter (INDEPENDENT_AMBULATORY_CARE_PROVIDER_SITE_OTHER): Payer: Self-pay | Admitting: Family Medicine

## 2009-02-14 ENCOUNTER — Encounter: Payer: Self-pay | Admitting: Family Medicine

## 2009-02-18 ENCOUNTER — Telehealth (INDEPENDENT_AMBULATORY_CARE_PROVIDER_SITE_OTHER): Payer: Self-pay | Admitting: Family Medicine

## 2009-02-28 ENCOUNTER — Encounter: Payer: Self-pay | Admitting: Family Medicine

## 2009-03-05 ENCOUNTER — Inpatient Hospital Stay (HOSPITAL_COMMUNITY): Admission: RE | Admit: 2009-03-05 | Discharge: 2009-03-11 | Payer: Self-pay | Admitting: Orthopedic Surgery

## 2009-03-26 ENCOUNTER — Telehealth: Payer: Self-pay | Admitting: *Deleted

## 2009-04-08 ENCOUNTER — Encounter: Payer: Self-pay | Admitting: Family Medicine

## 2009-04-08 ENCOUNTER — Telehealth: Payer: Self-pay | Admitting: Family Medicine

## 2009-04-19 ENCOUNTER — Encounter: Payer: Self-pay | Admitting: Family Medicine

## 2009-04-19 ENCOUNTER — Ambulatory Visit: Payer: Self-pay | Admitting: Family Medicine

## 2009-04-23 ENCOUNTER — Encounter: Payer: Self-pay | Admitting: Family Medicine

## 2009-05-02 ENCOUNTER — Ambulatory Visit: Payer: Self-pay | Admitting: Internal Medicine

## 2009-05-02 LAB — CONVERTED CEMR LAB
Albumin: 3.1 g/dL — ABNORMAL LOW (ref 3.5–5.2)
Ammonia: 6 umol/L — ABNORMAL LOW (ref 11–35)
Basophils Relative: 0.3 % (ref 0.0–3.0)
CO2: 31 meq/L (ref 19–32)
Calcium: 8.5 mg/dL (ref 8.4–10.5)
Eosinophils Relative: 1 % (ref 0.0–5.0)
GFR calc non Af Amer: 70.63 mL/min (ref >60)
Glucose, Bld: 85 mg/dL (ref 70–99)
Lymphocytes Relative: 43 % (ref 12.0–46.0)
MCV: 116.5 fL — ABNORMAL HIGH (ref 78.0–100.0)
Neutrophils Relative %: 38.5 % — ABNORMAL LOW (ref 43.0–77.0)
Potassium: 4.6 meq/L (ref 3.5–5.1)
RBC: 3.47 M/uL — ABNORMAL LOW (ref 3.87–5.11)
Sodium: 139 meq/L (ref 135–145)
Total Protein: 7.4 g/dL (ref 6.0–8.3)
WBC: 7 10*3/uL (ref 4.5–10.5)

## 2009-05-03 ENCOUNTER — Encounter: Payer: Self-pay | Admitting: Family Medicine

## 2009-05-06 ENCOUNTER — Encounter: Payer: Self-pay | Admitting: Family Medicine

## 2009-05-08 ENCOUNTER — Telehealth: Payer: Self-pay | Admitting: Psychology

## 2009-05-09 ENCOUNTER — Telehealth: Payer: Self-pay | Admitting: Psychology

## 2009-06-06 ENCOUNTER — Telehealth: Payer: Self-pay | Admitting: Psychology

## 2009-06-10 ENCOUNTER — Encounter: Payer: Self-pay | Admitting: Family Medicine

## 2009-06-18 ENCOUNTER — Telehealth: Payer: Self-pay | Admitting: Family Medicine

## 2009-06-25 ENCOUNTER — Ambulatory Visit: Payer: Self-pay | Admitting: Family Medicine

## 2009-06-26 ENCOUNTER — Telehealth: Payer: Self-pay | Admitting: Family Medicine

## 2009-06-30 ENCOUNTER — Encounter: Payer: Self-pay | Admitting: Family Medicine

## 2009-07-11 ENCOUNTER — Encounter: Admission: RE | Admit: 2009-07-11 | Discharge: 2009-08-19 | Payer: Self-pay | Admitting: Orthopedic Surgery

## 2009-07-17 ENCOUNTER — Telehealth: Payer: Self-pay | Admitting: Psychology

## 2009-07-29 ENCOUNTER — Telehealth: Payer: Self-pay | Admitting: Psychology

## 2009-07-30 ENCOUNTER — Telehealth: Payer: Self-pay | Admitting: Psychology

## 2009-08-07 ENCOUNTER — Ambulatory Visit: Payer: Self-pay | Admitting: Psychology

## 2009-08-09 ENCOUNTER — Telehealth: Payer: Self-pay | Admitting: Psychology

## 2009-08-20 ENCOUNTER — Telehealth: Payer: Self-pay | Admitting: Psychology

## 2009-09-30 ENCOUNTER — Telehealth: Payer: Self-pay | Admitting: Family Medicine

## 2009-10-02 ENCOUNTER — Telehealth: Payer: Self-pay | Admitting: Family Medicine

## 2009-10-04 ENCOUNTER — Ambulatory Visit: Payer: Self-pay | Admitting: Family Medicine

## 2009-10-07 ENCOUNTER — Telehealth: Payer: Self-pay | Admitting: Family Medicine

## 2009-10-16 ENCOUNTER — Telehealth: Payer: Self-pay | Admitting: Family Medicine

## 2009-10-16 ENCOUNTER — Ambulatory Visit: Payer: Self-pay | Admitting: Family Medicine

## 2009-10-21 ENCOUNTER — Telehealth: Payer: Self-pay | Admitting: Family Medicine

## 2009-10-28 ENCOUNTER — Telehealth: Payer: Self-pay | Admitting: Family Medicine

## 2009-10-29 ENCOUNTER — Ambulatory Visit: Payer: Self-pay | Admitting: Family Medicine

## 2009-11-15 ENCOUNTER — Ambulatory Visit: Payer: Self-pay | Admitting: Family Medicine

## 2009-12-16 ENCOUNTER — Ambulatory Visit: Payer: Self-pay | Admitting: Psychology

## 2010-01-13 ENCOUNTER — Ambulatory Visit: Payer: Self-pay | Admitting: Psychology

## 2010-02-12 ENCOUNTER — Encounter: Admission: RE | Admit: 2010-02-12 | Discharge: 2010-02-12 | Payer: Self-pay | Admitting: Orthopedic Surgery

## 2010-02-12 ENCOUNTER — Encounter: Payer: Self-pay | Admitting: Family Medicine

## 2010-02-20 ENCOUNTER — Telehealth: Payer: Self-pay | Admitting: Family Medicine

## 2010-03-11 ENCOUNTER — Encounter: Payer: Self-pay | Admitting: Family Medicine

## 2010-03-21 ENCOUNTER — Encounter: Admission: RE | Admit: 2010-03-21 | Discharge: 2010-03-21 | Payer: Self-pay | Admitting: Orthopedic Surgery

## 2010-03-28 ENCOUNTER — Telehealth: Payer: Self-pay | Admitting: Internal Medicine

## 2010-04-02 ENCOUNTER — Telehealth: Payer: Self-pay | Admitting: Family Medicine

## 2010-04-14 ENCOUNTER — Encounter
Admission: RE | Admit: 2010-04-14 | Discharge: 2010-07-13 | Payer: Self-pay | Admitting: Physical Medicine & Rehabilitation

## 2010-04-17 ENCOUNTER — Ambulatory Visit: Payer: Self-pay | Admitting: Family Medicine

## 2010-04-17 DIAGNOSIS — F4001 Agoraphobia with panic disorder: Secondary | ICD-10-CM

## 2010-04-17 DIAGNOSIS — F411 Generalized anxiety disorder: Secondary | ICD-10-CM | POA: Insufficient documentation

## 2010-05-12 ENCOUNTER — Ambulatory Visit: Payer: Self-pay | Admitting: Internal Medicine

## 2010-06-12 ENCOUNTER — Telehealth: Payer: Self-pay | Admitting: Internal Medicine

## 2010-06-16 ENCOUNTER — Telehealth: Payer: Self-pay | Admitting: *Deleted

## 2010-06-23 ENCOUNTER — Telehealth: Payer: Self-pay | Admitting: Internal Medicine

## 2010-08-04 ENCOUNTER — Telehealth: Payer: Self-pay | Admitting: Family Medicine

## 2010-08-27 ENCOUNTER — Telehealth: Payer: Self-pay | Admitting: Family Medicine

## 2010-09-10 ENCOUNTER — Encounter: Payer: Self-pay | Admitting: *Deleted

## 2010-09-13 ENCOUNTER — Encounter: Payer: Self-pay | Admitting: Sports Medicine

## 2010-09-14 ENCOUNTER — Encounter: Payer: Self-pay | Admitting: Orthopedic Surgery

## 2010-09-23 NOTE — Assessment & Plan Note (Signed)
Summary: depression,tcb   Vital Signs:  Patient profile:   50 year old female Height:      62 inches Weight:      183 pounds BMI:     33.59 Temp:     98.1 degrees F oral Pulse rate:   88 / minute BP sitting:   142 / 90  (left arm) Cuff size:   regular  Vitals Entered By: [name] CMA (November 15, 2009 10:55 AM) CC: discuss depreession Is Patient Diabetic? No Pain Assessment Patient in pain? no        Primary Care Provider:  Helane Rima DO  CC:  discuss depreession.  History of Present Illness: [name] comes in today to discuss her depression.  She has a longstanding history of depression.  She has taken bupropion, Effexor, Remeron, and most recently Zoloft in the past.  She stopped zoloft "in the fall" as she didn't feel it was helping.  She had an initial appointment with Dr. Pascal Lux in December but did not follow-up. SHe states she just didn't feel like coming in and states this is typical of her depression.  She doesn't want to leave the house or go out and do anything.  She doesn't find enjoyment in anything.  She has sleep disturbance - sometimes difficulty sleeping and somtiems sleeps too much.  Not happy.  Not hopeful.  Denies any thoughts of suicide or homicide.  Wants to go back on medication and restart CBT with Dr. Pascal Lux.   Habits & Providers  Alcohol-Tobacco-Diet     Tobacco Status: current     Cigarette Packs/Day: <0.25  Allergies: 1)  Codeine Phosphate (Codeine Phosphate)  Social History: Packs/Day:  <0.25  Physical Exam  General:  obese, alert, NAD, cooperative, vitals reviewed.  Psych:  Oriented X3, memory intact for recent and remote, normally interactive, good eye contact, and dysphoric affect.  No SI or HI   Impression & Recommendations:  Problem # 1:  DEPRESSIVE DISORDER, NOS (ICD-311) Assessment Deteriorated  Will try celexa.  Discussed it does not work right away.  Will f/u in 3-4 weeks for assessment and possible titration up.  Advised  to call Dr. Pascal Lux at her earliest convenience to re-establish with her.  She still has her card.  Dr. Pascal Lux was considering referral to [facility].  Discussed if mood worsens or develops SI or HI to stop it and let us known immediately and can always go to ED if she feels she is in immediate danger.  Her updated medication list for this problem includes:    Klonopin 1 Mg Tabs (Clonazepam) .Marland Kitchen... 1 by mouth three times a day as needed for anxiety    Hydroxyzine Hcl 25 Mg Tabs (Hydroxyzine hcl) .Marland Kitchen... Take 1 tablet by mouth four times a day    Celexa 20 Mg Tabs (Citalopram hydrobromide) .Marland Kitchen... 1 tab by mouth daily for depression  Orders: [facility]- Est  Level 4 (99214)  Complete Medication List: 1)  Klonopin 1 Mg Tabs (Clonazepam) .Marland Kitchen.. 1 by mouth three times a day as needed for anxiety 2)  Hydroxyzine Hcl 25 Mg Tabs (Hydroxyzine hcl) .... Take 1 tablet by mouth four times a day 3)  Cetirizine Hcl 10 Mg Tabs (Cetirizine hcl) .... Take one by mouth at bedtime 4)  Spironolactone 25 Mg Tabs (Spironolactone) .... 2 by mouth daily 5)  Pataday 0.2 % Soln (Olopatadine hcl) .Marland Kitchen.. 1 drop in both eyes once daily disp: qs x one month 6)  Ibuprofen 400 Mg Tabs (Ibuprofen) .Marland KitchenMarland KitchenMarland Kitchen  1 by mouth three times a day as needed for pain 7)  Lasix 20 Mg Tabs (Furosemide) .... One tab by mouth qday 8)  Ursodiol 300 Mg Caps (Ursodiol) .Marland Kitchen.. 1 cap by mouth three times a day 9)  Coreg 6.25 Mg Tabs (Carvedilol) .Marland Kitchen.. 1 by mouth two times a day 10)  Celexa 20 Mg Tabs (Citalopram hydrobromide) .Marland Kitchen.. 1 tab by mouth daily for depression 11)  Rifadin 300 Mg Caps (Rifampin) .Marland Kitchen.. 1 by mouth two times a day per liver md 12)  Pepcid 20 Mg Tabs (Famotidine) .... Take 1 tablet by mouth two times a day 13)  Os-cal 500 + D 500-400 Mg-unit Tabs (Calcium carbonate-vitamin d) .... As directed 14)  Boniva 150 Mg Tabs (Ibandronate sodium) .... Once monthly 15)  Oxycodone-acetaminophen 10-325 Mg Tabs (Oxycodone-acetaminophen) .... One tablet by mouth every six hours  as needed for pain 16)  Xifaxan 550 Mg Tabs (Rifaximin) .... Take 1 tablet by mouth two times a day 17)  Naltrexone Hcl 50 Mg Tabs (Naltrexone hcl) .... Take 1 tablet by mouth once a day 18)  Flonase 50 Mcg/act Susp (Fluticasone propionate) .... One puff per nostril qdaily 19)  Tessalon Perles 100 Mg Caps (Benzonatate) .Marland Kitchen.. 1-2 by mouth three times a day as needed cough 20)  Doxycycline Hyclate 100 Mg Tabs (Doxycycline hyclate) .... One tab two times a day for 7 days  Patient Instructions: 1)  It was nice to meet you today.  I'm glad you came back in to discuss your depression. 2)  I have prescribed you a medicine called Celexa, or also called citalopram generically.  Take one tablet everyday.  It will not work overnight.  By 3-4 weeks you may notice SOME improvement, but it can take 8 weeks to notice a full effect, so please don't give up on it until you have at least taken it for 2 months.  However, if it makes you feel worse or you start having thoughts of suicide then stop taking it and let us know right away.' 3)  Please make a follow-up appointment with either myself or Dr. Earlene Plater for 3-4 weeks so we can see how you are doing.  We can possibly increase the dose at that time if indicated. 4)  Please call Dr. Pascal Lux at your earliest convenience to re-establish with her as couseling and medication together work best.  5)  Have a good weekend.  Prescriptions: CELEXA 20 MG TABS (CITALOPRAM HYDROBROMIDE) 1 tab by mouth daily for depression  #30 x 2   Entered and Authorized by:   Ardeen Garland  MD   Signed by:   Ardeen Garland  MD on 11/15/2009   Method used:   Print then Give to Patient   RxID:   [id number]

## 2010-09-23 NOTE — Consult Note (Signed)
Summary: [facility]   Imported By: Clydell Hakim 02/19/2009 15:16:59  _____________________________________________________________________  External Attachment:    Type:   Image     Comment:   External Document

## 2010-09-23 NOTE — Progress Notes (Signed)
Summary: phn msg   Phone Note Call from Patient Call back at Home Phone [phone]   Caller: Patient Summary of Call: still coughing a lot during day and still has a lot of mucus Initial call taken by: De Nurse,  October 21, 2009 1:44 PM  Follow-up for Phone Call        saw Dr. Earlene Plater last wk. Kimberlee Nearing not helping. lots of mucous. wants to know what else she can take.  states it is somewhat better than last week. wants to  know if mucinex is ok with liver & HTN problems.  told her I will send to md & call her with answer. Follow-up by: Golden Circle RN,  October 21, 2009 1:48 PM  Additional Follow-up for Phone Call Additional follow up Details #1::        Per Dr. Leveda Anna, try OTC Robitussin DM. called pt & told her to try that. she agreed with plan Additional Follow-up by: Golden Circle RN,  October 21, 2009 2:03 PM

## 2010-09-23 NOTE — Progress Notes (Signed)
Summary: Triage   Phone Note Call from Patient Call back at Home Phone [phone]   Caller: Patient Call For: Dr. Leone Payor Reason for Call: Talk to Nurse Summary of Call: Requesting to speak directly to nurse Initial call taken by: Karna Christmas,  June 23, 2010 10:31 AM  Follow-up for Phone Call        phone is busy I will continue to try and reach the patient  Darcey Nora RN, [facility]  June 23, 2010 10:35 AM  I spoke with the patient, she states at the last office visit she was told that she only has 10 years to live by Dr Leone Payor.  She wants to talk to speak with Dr Leone Payor about why this was told to her and where this was coming from. She would like to know how this number "was derived".  Patient  is aware that Dr Leone Payor is out of the office until tomorrow. Follow-up by: Darcey Nora RN, CGRN,  June 23, 2010 12:12 PM  Additional Follow-up for Phone Call Additional follow up Details #1::        I don't think i said that to her though she has a chronic illness (liver disease) likely to shorten her life expectancy i do not make those kinds of predictions definitively.  Additional Follow-up by: Iva Boop MD, Clementeen Graham,  June 24, 2010 6:18 AM    Additional Follow-up for Phone Call Additional follow up Details #2::    I relayed Dr Marvell Fuller response .  She thanked me and she says she must have misunderstood.  She will schedule an office visit once she gets her coverage with the [facility] assistance. Follow-up by: Darcey Nora RN, CGRN,  June 24, 2010 8:52 AM

## 2010-09-23 NOTE — Assessment & Plan Note (Signed)
Summary: F/U BP/[facility]   Vital Signs:  Patient profile:   50 year old female Height:      62 inches Weight:      186.25 pounds Temp:     97.2 degrees F oral Pulse rate:   60 / minute BP sitting:   112 / 70  (right arm)  Vitals Entered By: Terese Door (May 03, 2009 10:44 AM) CC: F/U BP/DEPRESSION Is Patient Diabetic? No Pain Assessment Patient in pain? yes     Location: hip Intensity: 2   Primary Care Provider:  Helane Rima DO  CC:  F/U BP/DEPRESSION.  History of Present Illness: 50 yo WF with  1. HTN: Rx Rx coreg, spironolactone, lasix. bp at goal this am. patient states that it goes up at home when she gets stressed. denies CP, SOB, HA, vision changes.   2. Depression: Rx Zoloft. states that she sometimes won't get out of the bed when it is really bad. last "bad" episode was > 2 months ago and lasted for 1 month. "I feel that I would like to [facility] whatever] but I don't have the energy and the want doesn't overwhelm the no energy." previously evaluated at [facility] and they have an evaluation to give to her. she states that she worked with Dr. Pascal Lux in the past and that it helped. she denies having a therapist now. no SI/HI.  3. Anxiety: endorses panic attacks when she goes out of the house. states that it can be very difficult to make herself come to the [facility] because of this. Rx: Klonopin 1 mg three times a day as needed, usually takes 1-2 daily.   Habits & Providers  Alcohol-Tobacco-Diet     Tobacco Status: quit > 6 months  Current Medications (verified): 1)  Klonopin 1 Mg  Tabs (Clonazepam) .Marland Kitchen.. 1 By Mouth Three Times A Day As Needed For Anxiety 2)  Hydroxyzine Hcl 25 Mg Tabs (Hydroxyzine Hcl) .... Take 1 Tablet By Mouth Four Times A Day 3)  Loratadine 10 Mg Tabs (Loratadine) .... Take 1 Tablet By Mouth Once A Day 4)  Spironolactone 25 Mg  Tabs (Spironolactone) .... 2 By Mouth Daily 5)  Pataday 0.2 %  Soln (Olopatadine Hcl) .Marland Kitchen.. 1 Drop in Both Eyes Once Daily Disp: Qs X  One Month 6)  Ibuprofen 400 Mg Tabs (Ibuprofen) .Marland Kitchen.. 1 By Mouth Three Times A Day As Needed For Pain 7)  Lasix 20 Mg  Tabs (Furosemide) .... One Tab By Mouth Qday 8)  Ursodiol 300 Mg  Caps (Ursodiol) .Marland Kitchen.. 1 Cap By Mouth Three Times A Day 9)  Coreg 6.25 Mg Tabs (Carvedilol) .Marland Kitchen.. 1 By Mouth Two Times A Day 10)  Zoloft 25 Mg Tabs (Sertraline Hcl) .... Take 2 By Mouth Daily 11)  Rifadin 300 Mg Caps (Rifampin) .Marland Kitchen.. 1 By Mouth Two Times A Day Per Liver Md 12)  Pepcid 20 Mg Tabs (Famotidine) .... Take 1 Tablet By Mouth Two Times A Day 13)  Os-Cal 500 + D 500-400 Mg-Unit Tabs (Calcium Carbonate-Vitamin D) .... As Directed 14)  Boniva 150 Mg Tabs (Ibandronate Sodium) .... Once Monthly 15)  Oxycodone-Acetaminophen 10-325 Mg Tabs (Oxycodone-Acetaminophen) .... One Tablet By Mouth Every Six Hours As Needed For Pain 16)  Xifaxan 550 Mg Tabs (Rifaximin) .... Take 1 Tablet By Mouth Two Times A Day  Allergies (verified): 1)  Codeine Phosphate (Codeine Phosphate)  Social History: Smoking Status:  quit > 6 months  Review of Systems       The patient  complains of depression.  The patient denies anorexia, fever, weight loss, weight gain, chest pain, syncope, dyspnea on exertion, peripheral edema, prolonged cough, headaches, abdominal pain, severe indigestion/heartburn, and muscle weakness.    Physical Exam  General:  Well-developed, well-nourished, in no acute distress; alert, appropriate and cooperative throughout examination. Obese. Vital signs reviewed. Lungs:  Normal respiratory effort, chest expands symmetrically. Lungs are clear to auscultation, no crackles or wheezes. Heart:  Normal rate and regular rhythm. S1 and S2 normal without gallop, murmur, click, rub or other extra sounds. Pulses:  2+ DP pulses Psych:  Oriented X3, memory intact for recent and remote, normally interactive, good eye contact, not anxious appearing, and not depressed appearing.     Impression & Recommendations:  Problem #  1:  DEPRESSIVE DISORDER, NOS (ICD-311) Assessment Deteriorated Coninue Zoloft. Request evaluation from [facility]. Rec: therapy. Her updated medication list for this problem includes:    Klonopin 1 Mg Tabs (Clonazepam) .Marland Kitchen... 1 by mouth three times a day as needed for anxiety    Hydroxyzine Hcl 25 Mg Tabs (Hydroxyzine hcl) .Marland Kitchen... Take 1 tablet by mouth four times a day    Zoloft 25 Mg Tabs (Sertraline hcl) .Marland Kitchen... Take 2 by mouth daily  Orders: [facility]- Est  Level 4 (16109)  Problem # 2:  ANXIETY (ICD-300.00) Assessment: Deteriorated Continue Klonopin. See #1. Her updated medication list for this problem includes:    Klonopin 1 Mg Tabs (Clonazepam) .Marland Kitchen... 1 by mouth three times a day as needed for anxiety    Hydroxyzine Hcl 25 Mg Tabs (Hydroxyzine hcl) .Marland Kitchen... Take 1 tablet by mouth four times a day    Zoloft 25 Mg Tabs (Sertraline hcl) .Marland Kitchen... Take 2 by mouth daily  Orders: [facility]- Est  Level 4 (60454)  Problem # 3:  HYPERTENSION, BENIGN SYSTEMIC (ICD-401.1) Assessment: Unchanged Continue current treatment. Note: the patient and OT have recorded much higher pressures at home. Since her pressures are always normotensive in the office, I will not change medication at this time.  Her updated medication list for this problem includes:    Spironolactone 25 Mg Tabs (Spironolactone) .Marland Kitchen... 2 by mouth daily    Lasix 20 Mg Tabs (Furosemide) ..... One tab by mouth qday    Coreg 6.25 Mg Tabs (Carvedilol) .Marland Kitchen... 1 by mouth two times a day  Orders: [facility]- Est  Level 4 (99214)  Complete Medication List: 1)  Klonopin 1 Mg Tabs (Clonazepam) .Marland Kitchen.. 1 by mouth three times a day as needed for anxiety 2)  Hydroxyzine Hcl 25 Mg Tabs (Hydroxyzine hcl) .... Take 1 tablet by mouth four times a day 3)  Loratadine 10 Mg Tabs (Loratadine) .... Take 1 tablet by mouth once a day 4)  Spironolactone 25 Mg Tabs (Spironolactone) .... 2 by mouth daily 5)  Pataday 0.2 % Soln (Olopatadine hcl) .Marland Kitchen.. 1 drop in both eyes once daily disp: qs x  one month 6)  Ibuprofen 400 Mg Tabs (Ibuprofen) .Marland Kitchen.. 1 by mouth three times a day as needed for pain 7)  Lasix 20 Mg Tabs (Furosemide) .... One tab by mouth qday 8)  Ursodiol 300 Mg Caps (Ursodiol) .Marland Kitchen.. 1 cap by mouth three times a day 9)  Coreg 6.25 Mg Tabs (Carvedilol) .Marland Kitchen.. 1 by mouth two times a day 10)  Zoloft 25 Mg Tabs (Sertraline hcl) .... Take 2 by mouth daily 11)  Rifadin 300 Mg Caps (Rifampin) .Marland Kitchen.. 1 by mouth two times a day per liver md 12)  Pepcid 20 Mg Tabs (Famotidine) .... Take  1 tablet by mouth two times a day 13)  Os-cal 500 + D 500-400 Mg-unit Tabs (Calcium carbonate-vitamin d) .... As directed 14)  Boniva 150 Mg Tabs (Ibandronate sodium) .... Once monthly 15)  Oxycodone-acetaminophen 10-325 Mg Tabs (Oxycodone-acetaminophen) .... One tablet by mouth every six hours as needed for pain 16)  Xifaxan 550 Mg Tabs (Rifaximin) .... Take 1 tablet by mouth two times a day  Patient Instructions: 1)  It was very nice to see you today! 2)  Please make an appointment with Dr. Pascal Lux. 3)  Please have the [facility] psychologist send records to me. 4)  I will see you soon! 5)  Please let me know if you have any questions or concerns. Records release faxed to [facility]/Jessica Moore 05/03/09 to [phone] .Marland KitchenMarland KitchenMarland KitchenTerese Door  May 03, 2009 11:40 AM  Prevention & Chronic Care Immunizations   Influenza vaccine: Fluvax Non-MCR  (06/12/2008)   Influenza vaccine due: 06/12/2008    Tetanus booster: 09/25/2003: Done.   Tetanus booster due: 09/24/2013    Pneumococcal vaccine: Not documented  Other Screening   Pap smear: NEGATIVE FOR INTRAEPITHELIAL LESIONS OR MALIGNANCY.  (11/14/2008)   Pap smear due: 10/17/2008    Mammogram: Done.  (10/28/2005)   Mammogram due: 10/29/2006   Smoking status: quit > 6 months  (05/03/2009)  Lipids   Total Cholesterol: 110  (01/31/2008)   LDL: 73  (01/31/2008)   LDL Direct: Not documented   HDL: 17  (01/31/2008)   Triglycerides: 99   (01/31/2008)  Hypertension   Last Blood Pressure: 112 / 70  (05/03/2009)   Serum creatinine: 0.9  (05/02/2009)   Serum potassium 4.6  (05/02/2009)    Hypertension flowsheet reviewed?: Yes   Progress toward BP goal: At goal  Self-Management Support :   Personal Goals (by the next clinic visit) :      Personal blood pressure goal: 130/80  (05/03/2009)   Patient will work on the following items until the next clinic visit to reach self-care goals:     Medications and monitoring: take my medicines every day, check my blood pressure  (05/03/2009)    Hypertension self-management support: Written self-care plan  (05/03/2009)   Hypertension self-care plan printed.

## 2010-09-23 NOTE — Assessment & Plan Note (Signed)
Summary: discuss EGD/ sheri    History of Present Illness Visit Type: Follow-up Visit Primary GI MD: Stan Head MD [facility] Primary Provider: Helane Rima DO Requesting Provider: n/a Chief Complaint: GERD History of Present Illness:   50 yo ww with HCV Was seen by Dr. Julieta Gutting and he asked for EGD to screen for varices in context of HR of 100 and use of cervedilol, with possible dose increase. July 18 office visit .  She is having some intermitent dysphagia at times and responded to dilation of the esophagus in the past.    GI Review of Systems    Reports acid reflux, bloating, dysphagia with solids, and  nausea.      Denies abdominal pain, belching, chest pain, dysphagia with liquids, heartburn, loss of appetite, vomiting, vomiting blood, weight loss, and  weight gain.        Denies anal fissure, black tarry stools, change in bowel habit, constipation, diarrhea, diverticulosis, fecal incontinence, heme positive stool, hemorrhoids, irritable bowel syndrome, jaundice, light color stool, liver problems, rectal bleeding, and  rectal pain.    Current Medications (verified): 1)  Klonopin 1 Mg  Tabs (Clonazepam) .Marland Kitchen.. 1 By Mouth Three Times A Day As Needed For Anxiety 2)  Hydroxyzine Hcl 25 Mg Tabs (Hydroxyzine Hcl) .... Take 1 Tablet By Mouth Four Times A Day 3)  Cetirizine Hcl 10 Mg Tabs (Cetirizine Hcl) .... Take One By Mouth At Bedtime 4)  Spironolactone 25 Mg  Tabs (Spironolactone) .... 2 By Mouth Daily 5)  Lasix 20 Mg  Tabs (Furosemide) .... One Tab By Mouth Qday 6)  Coreg 6.25 Mg Tabs (Carvedilol) .Marland Kitchen.. 1 By Mouth Two Times A Day 7)  Celexa 40 Mg Tabs (Citalopram Hydrobromide) .... One By Mouth Daily For Depression 8)  Rifadin 300 Mg Caps (Rifampin) .Marland Kitchen.. 1 By Mouth Two Times A Day Per Liver Md 9)  Pepcid 20 Mg Tabs (Famotidine) .... Take 1 Tablet By Mouth Two Times A Day 10)  Os-Cal 500 + D 500-400 Mg-Unit Tabs (Calcium Carbonate-Vitamin D) .... As Directed 11)  Boniva 150 Mg Tabs  (Ibandronate Sodium) .... Once Monthly 12)  Xifaxan 550 Mg Tabs (Rifaximin) .... Take 1 Tablet By Mouth Two Times A Day 13)  Citalopram Hydrobromide 40 Mg Tabs (Citalopram Hydrobromide) .... Once Daily 14)  Tylenol 325 Mg Tabs (Acetaminophen) .... As Needed 15)  Pre-Natal Formula  Tabs (Prenatal Multivit-Min-Fe-Fa) .... Once Daily 16)  Nature's Pearl .... Take 2 Tablets By Mouth Daily  Allergies (verified): 1)  Codeine Phosphate (Codeine Phosphate)  Past History:  Past Medical History: Reviewed history from 05/02/2009 and no changes required. 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 unknown  Social History: In AA, last drink of EtOH 11/06; widowed; previously married x 16yrs; husband died of heart problems; lives alone;  2 children  (b. 1989, 56); unemployed  on disability; +tobacco  1/2ppd x 25years; ; denies illicit drugs; previously a Engineer, site prior to head trauma in 2004; 5 total sexual  partners in her life; occasionally sexually active currently; daugther with micropthalmia, visual impairment, chromosome abnormality, other daughter recently had baby at age 26, difficult relationship, getting better  Sept 2011 - lost Medicaid after  401K funds discovered and included in assets in school  Review of Systems       no chest pain, dyspnea  Vital Signs:  Patient profile:   50 year old female Height:      62 inches Weight:      187.38 pounds BMI:     34.40 Pulse rate:   68 / minute Pulse rhythm:   regular BP sitting:   128 / 90  (left arm) Cuff size:   regular  Vitals Entered By: June McMurray CMA Duncan Dull) (May 12, 2010 10:57 AM)  Physical Exam  General:  Obese, alert, NAD, cooperative, vitals reviewed.  Eyes:  anicteric Lungs:  Clear throughout to auscultation. Heart:  Regular rate and rhythm; no murmurs, rubs,  or bruits. Abdomen:  Bowel sounds positive,abdomen soft and non-tender without masses, organomegaly or hernias noted. Extremities:  no edema Neurologic:  Alert and  oriented x 3 Skin:  spider angiomata in upper chest Psych:  Alert and cooperative. Normal mood and affect.   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 2)  Please call our office before then if your symptoms worsen. 3)  Copy sent to : Dr. Julieta Gutting; Helane Rima, MD 4)  The medication list was reviewed and reconciled.  All changed / newly prescribed medications were explained.  A complete medication list was provided to the patient / caregiver.

## 2010-09-23 NOTE — Progress Notes (Signed)
Summary: refill   Phone Note Refill Request Call back at Home Phone [phone] Message from:  Patient  Refills Requested: Medication #1:  LASIX 20 MG  TABS one tab by mouth qday pt has been out for a while  Initial call taken by: De Nurse,  February 20, 2010 1:40 PM    Prescriptions: LASIX 20 MG  TABS (FUROSEMIDE) one tab by mouth qday  #31 Tablet x 11   Entered and Authorized by:   Helane Rima DO   Signed by:   Helane Rima DO on 02/20/2010   Method used:   Electronically to        [facility] [phone]* (retail)       [address]       [place], [place]  [address]       Ph: [phone] or [phone]       Fax: [phone]   RxID:   [id number]

## 2010-09-23 NOTE — Assessment & Plan Note (Signed)
Summary: wants antibiotics.see noes/[place]/wallace   Vital Signs:  Patient profile:   50 year old female Height:      62 inches Weight:      181 pounds BMI:     33.22 Temp:     97.7 degrees F oral Pulse rate:   102 / minute BP sitting:   124 / 88  (left arm) Cuff size:   regular  Vitals Entered By: [name] CMA (October 29, 2009 10:28 AM) CC: flu symptoms not any better Is Patient Diabetic? No Pain Assessment Patient in pain? yes     Location: head and chest Intensity: 5   Primary Care Provider:  Helane Rima DO  CC:  flu symptoms not any better.  History of Present Illness: Sick for 2 weeks, feels that she needs antibotics.  Seen early in course and given cough suppressant.  BIggest problem is cough.  She is a smoker.  Denies wheezing at night.  Fever is gone.  However believes she is getting sicker and sputum is darker and thicker.  Habits & Providers  Alcohol-Tobacco-Diet     Tobacco Status: current  Current Medications (verified): 1)  Klonopin 1 Mg  Tabs (Clonazepam) .Marland Kitchen.. 1 By Mouth Three Times A Day As Needed For Anxiety 2)  Hydroxyzine Hcl 25 Mg Tabs (Hydroxyzine Hcl) .... Take 1 Tablet By Mouth Four Times A Day 3)  Cetirizine Hcl 10 Mg Tabs (Cetirizine Hcl) .... Take One By Mouth At Bedtime 4)  Spironolactone 25 Mg  Tabs (Spironolactone) .... 2 By Mouth Daily 5)  Pataday 0.2 %  Soln (Olopatadine Hcl) .Marland Kitchen.. 1 Drop in Both Eyes Once Daily Disp: Qs X One Month 6)  Ibuprofen 400 Mg Tabs (Ibuprofen) .Marland Kitchen.. 1 By Mouth Three Times A Day As Needed For Pain 7)  Lasix 20 Mg  Tabs (Furosemide) .... One Tab By Mouth Qday 8)  Ursodiol 300 Mg  Caps (Ursodiol) .Marland Kitchen.. 1 Cap By Mouth Three Times A Day 9)  Coreg 6.25 Mg Tabs (Carvedilol) .Marland Kitchen.. 1 By Mouth Two Times A Day 10)  Zoloft 25 Mg Tabs (Sertraline Hcl) .... Take 2 By Mouth Daily 11)  Rifadin 300 Mg Caps (Rifampin) .Marland Kitchen.. 1 By Mouth Two Times A Day Per Liver Md 12)  Pepcid 20 Mg Tabs (Famotidine) .... Take 1 Tablet By Mouth Two  Times A Day 13)  Os-Cal 500 + D 500-400 Mg-Unit Tabs (Calcium Carbonate-Vitamin D) .... As Directed 14)  Boniva 150 Mg Tabs (Ibandronate Sodium) .... Once Monthly 15)  Oxycodone-Acetaminophen 10-325 Mg Tabs (Oxycodone-Acetaminophen) .... One Tablet By Mouth Every Six Hours As Needed For Pain 16)  Xifaxan 550 Mg Tabs (Rifaximin) .... Take 1 Tablet By Mouth Two Times A Day 17)  Naltrexone Hcl 50 Mg Tabs (Naltrexone Hcl) .... Take 1 Tablet By Mouth Once A Day 18)  Flonase 50 Mcg/act Susp (Fluticasone Propionate) .... One Puff Per Nostril Qdaily 19)  Tessalon Perles 100 Mg  Caps (Benzonatate) .Marland Kitchen.. 1-2 By Mouth Three Times A Day As Needed Cough 20)  Doxycycline Hyclate 100 Mg Tabs (Doxycycline Hyclate) .... One Tab Two Times A Day For 7 Days  Allergies (verified): 1)  Codeine Phosphate (Codeine Phosphate)  Review of Systems General:  Complains of malaise; denies fever. ENT:  Complains of postnasal drainage. Resp:  Complains of cough; denies shortness of breath.  Physical Exam  General:  in no acute distress Ears:  TM grey with normal landmarks Nose:  not inflammed Mouth:  reddened Lungs:  Course rhonchi Heart:  normal rate and regular rhythm.     Impression & Recommendations:  Problem # 1:  ACUTE BRONCHITIS (ICD-466.0)  in a smoker, sick for 2 weeks, treat with one week of doxy  Her updated medication list for this problem includes:    Rifadin 300 Mg Caps (Rifampin) .Marland Kitchen... 1 by mouth two times a day per liver md    Xifaxan 550 Mg Tabs (Rifaximin) .Marland Kitchen... Take 1 tablet by mouth two times a day    Tessalon Perles 100 Mg Caps (Benzonatate) .Marland Kitchen... 1-2 by mouth three times a day as needed cough    Doxycycline Hyclate 100 Mg Tabs (Doxycycline hyclate) ..... One tab two times a day for 7 days  Orders: [facility]- Est Level  3 ([address])  Complete Medication List: 1)  Klonopin 1 Mg Tabs (Clonazepam) .Marland Kitchen.. 1 by mouth three times a day as needed for anxiety 2)  Hydroxyzine Hcl 25 Mg Tabs (Hydroxyzine  hcl) .... Take 1 tablet by mouth four times a day 3)  Cetirizine Hcl 10 Mg Tabs (Cetirizine hcl) .... Take one by mouth at bedtime 4)  Spironolactone 25 Mg Tabs (Spironolactone) .... 2 by mouth daily 5)  Pataday 0.2 % Soln (Olopatadine hcl) .Marland Kitchen.. 1 drop in both eyes once daily disp: qs x one month 6)  Ibuprofen 400 Mg Tabs (Ibuprofen) .Marland Kitchen.. 1 by mouth three times a day as needed for pain 7)  Lasix 20 Mg Tabs (Furosemide) .... One tab by mouth qday 8)  Ursodiol 300 Mg Caps (Ursodiol) .Marland Kitchen.. 1 cap by mouth three times a day 9)  Coreg 6.25 Mg Tabs (Carvedilol) .Marland Kitchen.. 1 by mouth two times a day 10)  Zoloft 25 Mg Tabs (Sertraline hcl) .... Take 2 by mouth daily 11)  Rifadin 300 Mg Caps (Rifampin) .Marland Kitchen.. 1 by mouth two times a day per liver md 12)  Pepcid 20 Mg Tabs (Famotidine) .... Take 1 tablet by mouth two times a day 13)  Os-cal 500 + D 500-400 Mg-unit Tabs (Calcium carbonate-vitamin d) .... As directed 14)  Boniva 150 Mg Tabs (Ibandronate sodium) .... Once monthly 15)  Oxycodone-acetaminophen 10-325 Mg Tabs (Oxycodone-acetaminophen) .... One tablet by mouth every six hours as needed for pain 16)  Xifaxan 550 Mg Tabs (Rifaximin) .... Take 1 tablet by mouth two times a day 17)  Naltrexone Hcl 50 Mg Tabs (Naltrexone hcl) .... Take 1 tablet by mouth once a day 18)  Flonase 50 Mcg/act Susp (Fluticasone propionate) .... One puff per nostril qdaily 19)  Tessalon Perles 100 Mg Caps (Benzonatate) .Marland Kitchen.. 1-2 by mouth three times a day as needed cough 20)  Doxycycline Hyclate 100 Mg Tabs (Doxycycline hyclate) .... One tab two times a day for 7 days  Patient Instructions: 1)  Drink a lot of fluids 2)  Take all of antibiotic with food 3)  Begin nasal spray and use at least for 2 weeks Prescriptions: DOXYCYCLINE HYCLATE 100 MG TABS (DOXYCYCLINE HYCLATE) one tab two times a day for 7 days  #14 x 0   Entered and Authorized by:   Luretha Murphy NP   Signed by:   Luretha Murphy NP on 10/29/2009   Method used:    [facility]        [facility] [phone]* (retail)       [address]       [place], [place]  [address]       Ph: [phone] or [phone]       Fax: [phone]   RxID:  [id number]  

## 2010-09-23 NOTE — Progress Notes (Signed)
Summary: refill   Phone Note Refill Request Call back at Home Phone [phone] Message from:  Patient  Refills Requested: Medication #1:  KLONOPIN 1 MG  TABS 1 by mouth three times a day as needed for anxiety Next Appointment Scheduled: 04/10/10 Initial call taken by: De Nurse,  April 02, 2010 11:29 AM  Follow-up for Phone Call        Rx for one month until appt. Follow-up by: Helane Rima DO,  April 02, 2010 1:31 PM    Prescriptions: KLONOPIN 1 MG  TABS (CLONAZEPAM) 1 by mouth three times a day as needed for anxiety  #90 x 0   Entered and Authorized by:   Helane Rima DO   Signed by:   Helane Rima DO on 04/02/2010   Method used:   Print then Give to Patient   RxID:   [id number]

## 2010-09-23 NOTE — Miscellaneous (Signed)
Summary: ROI  ROI   Imported By: Clydell Hakim 05/08/2009 11:44:12  _____________________________________________________________________  External Attachment:    Type:   Image     Comment:   External Document

## 2010-09-23 NOTE — Consult Note (Signed)
Summary: Scientist, clinical (histocompatibility and immunogenetics) Program  [facility] Program   Imported By: Clydell Hakim 03/19/2010 12:12:01  _____________________________________________________________________  External Attachment:    Type:   Image     Comment:   External Document  Appended Document: [facility] Program she needs an REV me to arrange for EGD - varices screening she may need propofol so I want to screen her  Appended Document: [facility] Program Left message for patient to call back    Appended Document: [facility] Program patient aware.  REV scheduled for 05/12/10 10:45

## 2010-09-23 NOTE — Progress Notes (Signed)
Summary: PT NEEDS OV WITH DR.WALLACE/TS   Phone Note Call from Patient Call back at Home Phone [phone]   Caller: Patient Summary of Call: pt is needing to talk to Dr Earlene Plater about meds for her allergy behind her eye - needs it to be OTC also has another issue that she needs to talk to dr about (did not want to share) Initial call taken by: De Nurse,  June 16, 2010 11:40 AM  Follow-up for Phone Call        patient needs an appt Follow-up by: Helane Rima DO,  June 16, 2010 12:02 PM  Additional Follow-up for Phone Call Additional follow up Details #1::        called pt. no answer. no answering machine. PLEASE TELL PT TO SCHED. OV WITH DR.WALLACE. Additional Follow-up by: Arlyss Repress CMA,,  June 16, 2010 12:19 PM

## 2010-09-23 NOTE — Progress Notes (Signed)
Summary: resch   Phone Note Call from Patient Call back at [phone]   Caller: Patient Summary of Call: pt has been waiting on ride and they are not here yet and they come from [facility].  She is sorry that she had to resch.  Initial call taken by: De Nurse,  June 18, 2009 8:46 AM

## 2010-09-23 NOTE — Assessment & Plan Note (Signed)
Summary: Behavioral Medicine Follow-up   Primary Care Provider:  Helane Rima DO   History of Present Illness: Wendy [name] attended one previous appt and that was in December, 2010.  She presents because she was told it is better to get therapy with medication when treating depression.  She has several concerns that are mostly about her memory and ability to attend and concentrate.  She also says she has trouble "dwelling" on things.  She isn't too specific about this but hints at thoughts about her late husband and also about her relationships with her daughters.  She continues with what sounds like a strong social support network.  She is having significant financial difficulties and may not be getting the best advice.  Allergies: 1)  Codeine Phosphate (Codeine Phosphate)   Impression & Recommendations:  Problem # 1:  DEPRESSIVE DISORDER, NOS (ICD-311)  Wendy [name] is neatly groomed and appropriately dressed.  She maintains good eye contact and is cooperative and attentive.  Her speech is normal in  rate.  Mood is euthuymic with a consistent affect.  She laughs on occasion.  Thought process is logical and goal directed.  No evidence of suicidal or homicidal ideation.  Does not appear to be responding to any internal stimuli.  She is able to maintain train of thought and concentrate on our conversation.  Judgment and insight are within normal limits.  PHQ-9 is 22 which puts her int he severe range of depression.  She endorses most symptoms as "nearly every day" and marks "not at all" for question 9 (suicidal thoughts).  She indicates her symptoms make it very difficult for her in terms of function.  I asked Wendy [name] to consider what would need to change in her life in order to create a better experience for herself.  She plans to write some things down.  We can still consider neuropsych testing if she is interested.  I can't remember if Dr. Leonides Cave accepts Medicaid.  We agreed to follow-up on May 23rd at 11:00  or as needed.      Her updated medication list for this problem includes:    Klonopin 1 Mg Tabs (Clonazepam) .Marland Kitchen... 1 by mouth three times a day as needed for anxiety    Hydroxyzine Hcl 25 Mg Tabs (Hydroxyzine hcl) .Marland Kitchen... Take 1 tablet by mouth four times a day    Celexa 20 Mg Tabs (Citalopram hydrobromide) .Marland Kitchen... 1 tab by mouth daily for depression  Orders: Therapy 40-50- min- [facility] ([address])  Complete Medication List: 1)  Klonopin 1 Mg Tabs (Clonazepam) .Marland Kitchen.. 1 by mouth three times a day as needed for anxiety 2)  Hydroxyzine Hcl 25 Mg Tabs (Hydroxyzine hcl) .... Take 1 tablet by mouth four times a day 3)  Cetirizine Hcl 10 Mg Tabs (Cetirizine hcl) .... Take one by mouth at bedtime 4)  Spironolactone 25 Mg Tabs (Spironolactone) .... 2 by mouth daily 5)  Pataday 0.2 % Soln (Olopatadine hcl) .Marland Kitchen.. 1 drop in both eyes once daily disp: qs x one month 6)  Ibuprofen 400 Mg Tabs (Ibuprofen) .Marland Kitchen.. 1 by mouth three times a day as needed for pain 7)  Lasix 20 Mg Tabs (Furosemide) .... One tab by mouth qday 8)  Ursodiol 300 Mg Caps (Ursodiol) .Marland Kitchen.. 1 cap by mouth three times a day 9)  Coreg 6.25 Mg Tabs (Carvedilol) .Marland Kitchen.. 1 by mouth two times a day 10)  Celexa 20 Mg Tabs (Citalopram hydrobromide) .Marland Kitchen.. 1 tab by mouth daily for depression 11)  Rifadin 300 Mg  Caps (Rifampin) .Marland Kitchen.. 1 by mouth two times a day per liver md 12)  Pepcid 20 Mg Tabs (Famotidine) .... Take 1 tablet by mouth two times a day 13)  Os-cal 500 + D 500-400 Mg-unit Tabs (Calcium carbonate-vitamin d) .... As directed 14)  Boniva 150 Mg Tabs (Ibandronate sodium) .... Once monthly 15)  Oxycodone-acetaminophen 10-325 Mg Tabs (Oxycodone-acetaminophen) .... One tablet by mouth every six hours as needed for pain 16)  Xifaxan 550 Mg Tabs (Rifaximin) .... Take 1 tablet by mouth two times a day 17)  Naltrexone Hcl 50 Mg Tabs (Naltrexone hcl) .... Take 1 tablet by mouth once a day 18)  Flonase 50 Mcg/act Susp (Fluticasone propionate) .... One puff per  nostril qdaily 19)  Tessalon Perles 100 Mg Caps (Benzonatate) .Marland Kitchen.. 1-2 by mouth three times a day as needed cough 20)  Doxycycline Hyclate 100 Mg Tabs (Doxycycline hyclate) .... One tab two times a day for 7 days

## 2010-09-23 NOTE — Assessment & Plan Note (Signed)
Summary: nausea/vomiting/diarrhea/cough   Vital Signs:  Patient profile:   50 year old female Height:      62 inches Weight:      178 pounds BMI:     32.67 Temp:     97.7 degrees F oral Pulse rate:   109 / minute BP sitting:   151 / 114  (right arm) Cuff size:   regular  Vitals Entered By: [name] CMA (October 16, 2009 8:38 AM)  Serial Vital Signs/Assessments:  Time      Position  BP       Pulse  Resp  Temp     By                     150/108                        Helane Rima DO  CC: nausea, vomiting, diarrhea, cough and sore throat x 1 week Is Patient Diabetic? No Pain Assessment Patient in pain? yes     Location: right leg Intensity: 6   Primary Care Provider:  Helane Rima DO  CC:  nausea, vomiting, diarrhea, and cough and sore throat x 1 week.  History of Present Illness: 50 yo F with:  1. Viral Illness: started with nausea and vomiting x 3 (NBNB) 1 week ago, vomiting lessened quickly, no vomiting since yesterday am. she has been able to eat bland food and drink plenty of fluids. she endorses intermittent loose stools throughout the past week (no blood). she endorses runny nose, PND, sore throat, ear fullness both ears, productive cough, and body aches. she denies fever/chills, HA, dizziness, neck pain, trouble swallowing, dyspnea, chest pain, abdominal pain, LE edema. she recently began taking Zyrtec and Flonase without significant change. + smoker.  2. HTN: uncontrolled today. patient states that she has not taken her medications today.  Habits & Providers  Alcohol-Tobacco-Diet     Tobacco Status: current  Current Medications (verified): 1)  Klonopin 1 Mg  Tabs (Clonazepam) .Marland Kitchen.. 1 By Mouth Three Times A Day As Needed For Anxiety 2)  Hydroxyzine Hcl 25 Mg Tabs (Hydroxyzine Hcl) .... Take 1 Tablet By Mouth Four Times A Day 3)  Cetirizine Hcl 10 Mg Tabs (Cetirizine Hcl) .... Take One By Mouth At Bedtime 4)  Spironolactone 25 Mg  Tabs (Spironolactone)  .... 2 By Mouth Daily 5)  Pataday 0.2 %  Soln (Olopatadine Hcl) .Marland Kitchen.. 1 Drop in Both Eyes Once Daily Disp: Qs X One Month 6)  Ibuprofen 400 Mg Tabs (Ibuprofen) .Marland Kitchen.. 1 By Mouth Three Times A Day As Needed For Pain 7)  Lasix 20 Mg  Tabs (Furosemide) .... One Tab By Mouth Qday 8)  Ursodiol 300 Mg  Caps (Ursodiol) .Marland Kitchen.. 1 Cap By Mouth Three Times A Day 9)  Coreg 6.25 Mg Tabs (Carvedilol) .Marland Kitchen.. 1 By Mouth Two Times A Day 10)  Zoloft 25 Mg Tabs (Sertraline Hcl) .... Take 2 By Mouth Daily 11)  Rifadin 300 Mg Caps (Rifampin) .Marland Kitchen.. 1 By Mouth Two Times A Day Per Liver Md 12)  Pepcid 20 Mg Tabs (Famotidine) .... Take 1 Tablet By Mouth Two Times A Day 13)  Os-Cal 500 + D 500-400 Mg-Unit Tabs (Calcium Carbonate-Vitamin D) .... As Directed 14)  Boniva 150 Mg Tabs (Ibandronate Sodium) .... Once Monthly 15)  Oxycodone-Acetaminophen 10-325 Mg Tabs (Oxycodone-Acetaminophen) .... One Tablet By Mouth Every Six Hours As Needed For Pain 16)  Xifaxan 550 Mg Tabs (Rifaximin) .Marland KitchenMarland KitchenMarland Kitchen  Take 1 Tablet By Mouth Two Times A Day 17)  Naltrexone Hcl 50 Mg Tabs (Naltrexone Hcl) .... Take 1 Tablet By Mouth Once A Day 18)  Flonase 50 Mcg/act Susp (Fluticasone Propionate) .... One Puff Per Nostril Qdaily 19)  Tessalon Perles 100 Mg  Caps (Benzonatate) .Marland Kitchen.. 1-2 By Mouth Three Times A Day As Needed Cough  Allergies (verified): 1)  Codeine Phosphate (Codeine Phosphate)  Past History:  Past Medical History: Reviewed history from 05/02/2009 and no changes required. 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 Derm:  Complains of lesion(s).  Physical Exam  General:  Well-developed, well-nourished, in no acute distress; alert, appropriate and cooperative throughout examination. Obese. Vital signs reviewed, hypertensive. Head:  Normocephalic and atraumatic.  no sinus tenderness, no TMJ tenderness. Eyes:  No scleral icterus. PERRLA, EOMI vision grossly intact.   Ears:  External ear exam shows no significant lesions or deformities.  Otoscopic examination reveals clear canals, tympanic membranes are intact bilaterally without bulging, retraction, inflammation or discharge.  Nose:  Clear rhinorrhea, pale dry turbinates. Mouth:  Pharynx pink and moist, no exudates, no lesions, no petechiae, pharyngeal erythema, and postnasal drip.   Neck:  No deformities, masses, or tenderness noted.  no LAD. Lungs:  Normal respiratory effort, chest expands symmetrically. Lungs are clear to auscultation, no crackles or wheezes. Heart:  Tachycardic. Reg Rhythm. No m/r/g. Abdomen:  Bowel sounds positive,abdomen soft and non-tender without masses, organomegaly or hernias noted. Pulses:  2+ DP pulses Extremities:  No clubbing, cyanosis, edema.   Impression & Recommendations:  Problem # 1:  VIRAL INFECTION, ACUTE (ICD-079.99) Assessment New  Discussed symptomatic treatment. Rx Tessalon for cough. Patient to call in 4-7 days if not improving. Her updated medication list for this problem includes:    Ibuprofen 400 Mg Tabs (Ibuprofen) .Marland Kitchen... 1 by mouth three times a day as needed for pain    Tessalon Perles 100 Mg Caps (Benzonatate) .Marland Kitchen... 1-2 by mouth three times a day as needed cough  Orders: [facility]- Est  Level 4 ([address])  Problem # 2:  HYPERTENSION, BENIGN SYSTEMIC (ICD-401.1) Assessment: Deteriorated  Advised patient to take  medications as soon as she gets home and to continue to take them as prescribed. Her updated medication list for this problem includes:    Spironolactone 25 Mg Tabs (Spironolactone) .Marland Kitchen... 2 by mouth daily    Lasix 20 Mg Tabs (Furosemide) ..... One tab by mouth qday    Coreg 6.25 Mg Tabs (Carvedilol) .Marland Kitchen... 1 by mouth two times a day  Orders: [facility]- Est  Level 4 ([address])  Problem # 3:  TOBACCO DEPENDENCE (ICD-305.1) Assessment: Unchanged  Advised to QUIT.  Orders: [facility]- Est  Level 4 ([address])  Complete Medication List: 1)  Klonopin 1 Mg Tabs (Clonazepam) .Marland Kitchen.. 1 by mouth three times a day as needed for anxiety 2)  Hydroxyzine Hcl 25 Mg Tabs (Hydroxyzine hcl) .... Take 1 tablet by mouth four times a day 3)  Cetirizine Hcl 10 Mg Tabs (Cetirizine hcl) .... Take one by mouth at bedtime 4)  Spironolactone 25 Mg Tabs (Spironolactone) .Marland KitchenMarland KitchenMarland Kitchen  2 by mouth daily 5)  Pataday 0.2 % Soln (Olopatadine hcl) .Marland Kitchen.. 1 drop in both eyes once daily disp: qs x one month 6)  Ibuprofen 400 Mg Tabs (Ibuprofen) .Marland Kitchen.. 1 by mouth three times a day as needed for pain 7)  Lasix 20 Mg Tabs (Furosemide) .... One tab by mouth qday 8)  Ursodiol 300 Mg Caps (Ursodiol) .Marland Kitchen.. 1 cap by mouth three times a day 9)  Coreg 6.25 Mg Tabs (Carvedilol) .Marland Kitchen.. 1 by mouth two times a day 10)  Zoloft 25 Mg Tabs (Sertraline hcl) .... Take 2 by mouth daily 11)  Rifadin 300 Mg Caps (Rifampin) .Marland Kitchen.. 1 by mouth two times a day per liver md 12)  Pepcid 20 Mg Tabs (Famotidine) .... Take 1 tablet by mouth two times a day 13)  Os-cal 500 + D 500-400 Mg-unit Tabs (Calcium carbonate-vitamin d) .... As directed 14)  Boniva 150 Mg Tabs (Ibandronate sodium) .... Once monthly 15)  Oxycodone-acetaminophen 10-325 Mg Tabs (Oxycodone-acetaminophen) .... One tablet by mouth every six hours as needed for pain 16)  Xifaxan 550 Mg Tabs (Rifaximin) .... Take 1 tablet by mouth two times a day 17)  Naltrexone Hcl 50 Mg Tabs (Naltrexone hcl) .... Take 1 tablet by mouth once  a day 18)  Flonase 50 Mcg/act Susp (Fluticasone propionate) .... One puff per nostril qdaily 19)  Tessalon Perles 100 Mg Caps (Benzonatate) .Marland Kitchen.. 1-2 by mouth three times a day as needed cough  Patient Instructions: 1)  It was so nice to see you today! 2)  You have a viral illness that should improve over the next week. If it does not, or if it worsens, let me know. 3)  Continue the symptomatic treatment that we discussed. 4)  I sent your prescription for Tessalon to the pharmacy. Prescriptions: TESSALON PERLES 100 MG  CAPS (BENZONATATE) 1-2 by mouth three times a day as needed cough  #60 x 0   Entered and Authorized by:   Helane Rima DO   Signed by:   Helane Rima DO on 10/16/2009   Method used:   [facility]        [facility] [phone][facility] (retail[facility]       [address]       [place], [place]  [address]       Ph: [phone] or [phone]       Fax: [phone]   RxID:   [id number]   Prevention & Chronic Care Immunizations   Influenza vaccine: Fluvax Non-MCR  (06/25/2009)   Influenza vaccine due: 06/12/2008    Tetanus booster: 09/25/2003: Done.   Tetanus booster due: 09/24/2013    Pneumococcal vaccine: Not documented  Other Screening   Pap smear: NEGATIVE FOR INTRAEPITHELIAL LESIONS OR MALIGNANCY.  (11/14/2008)   Pap smear due: 10/17/2008    Mammogram: Done.  (10/28/2005)   Mammogram due: 10/29/2006   Smoking status: current  (10/16/2009)   Smoking cessation counseling: yes  (07/27/2008)  Lipids   Total Cholesterol: 110  (01/31/2008)   LDL: 73  (01/31/2008)   LDL Direct: Not documented   HDL: 17  (01/31/2008)   Triglycerides: 99  (01/31/2008)  Hypertension   Last Blood Pressure: 151 / 114  (10/16/2009)   Serum creatinine: 0.9  (05/02/2009)   Serum potassium 4.6  (05/02/2009)    Hypertension flowsheet reviewed?: Yes   Progress toward BP goal: Unchanged  Self-Management Support :   Personal Goals (by the next clinic visit) :      Personal blood  pressure goal: 130/80  (05/03/2009)   Patient will work on the following items until the next clinic visit to reach self-care goals:     Medications and monitoring: take my medicines every day, bring all of my medications to every visit  (10/16/2009)     Eating: drink diet soda or water instead of juice or soda, eat more vegetables, use fresh or frozen vegetables, eat foods that are low in salt, eat baked foods instead of fried foods, eat fruit for snacks and desserts  (10/16/2009)    Hypertension self-management support: Written self-care plan  (10/16/2009)   Hypertension self-care plan printed.

## 2010-09-23 NOTE — Consult Note (Signed)
Summary: [facility]   Imported By: De Nurse 03/01/2009 16:45:48  _____________________________________________________________________  External Attachment:    Type:   Image     Comment:   External Document

## 2010-09-23 NOTE — Assessment & Plan Note (Signed)
Summary: sinus problems x "weeks"/[place]/[facility]   Vital Signs:  Patient profile:   50 year old female Height:      62 inches Weight:      180 pounds BMI:     33.04 Temp:     98.7 degrees F oral Pulse rate:   89 / minute BP sitting:   150 / 108  (right arm) Cuff size:   regular  Vitals Entered By: [name] CMA (October 04, 2009 8:52 AM) CC: sinus pressure x 1 month Is Patient Diabetic? No   Primary Care Provider:  Helane Rima DO  CC:  sinus pressure x 1 month.  History of Present Illness: CC:54mo h/o sinus pressure and rash  1. h/o sinus pressure and pain in eyes, went to eye doctor and told eyes looked fine.  Pressure started 1 mo ago.  In AM very congested, when blows nose, normally white mucous.  No fevers/chills, + ST.  + PND worse in AM.  + h/o allergies.  Takes loratadine for itching from liver.  Not worse pain with bending forward.  + HA with pressure in front.  + cough in am.  2. rash - bumps that started a few weeks ago, itchy, scratches at night and notes bleeding in am. no new meds, lotions, shampoos, creams, soaps, detergents.  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  Habits & Providers  Alcohol-Tobacco-Diet     Tobacco Status: current     Cigarette Packs/Day: 0.25  Current Medications (verified): 1)  Klonopin 1 Mg  Tabs (Clonazepam) .Marland Kitchen.. 1 By Mouth Three Times A Day As Needed For Anxiety 2)  Hydroxyzine Hcl 25 Mg Tabs (Hydroxyzine Hcl) .... Take 1 Tablet By Mouth Four Times A Day 3)  Cetirizine Hcl 10 Mg Tabs (Cetirizine Hcl) .... Take One By Mouth At Bedtime 4)  Spironolactone 25 Mg  Tabs (Spironolactone) .... 2 By Mouth Daily 5)  Pataday 0.2 %  Soln (Olopatadine Hcl) .Marland Kitchen.. 1 Drop in Both Eyes Once Daily Disp: Qs X One Month 6)  Ibuprofen 400 Mg Tabs (Ibuprofen) .Marland Kitchen.. 1 By Mouth Three Times A Day As Needed For Pain 7)  Lasix 20 Mg  Tabs (Furosemide) .... One Tab By Mouth  Qday 8)  Ursodiol 300 Mg  Caps (Ursodiol) .Marland Kitchen.. 1 Cap By Mouth Three Times A Day 9)  Coreg 6.25 Mg Tabs (Carvedilol) .Marland Kitchen.. 1 By Mouth Two Times A Day 10)  Zoloft 25 Mg Tabs (Sertraline Hcl) .... Take 2 By Mouth Daily 11)  Rifadin 300 Mg Caps (Rifampin) .Marland Kitchen.. 1 By Mouth Two Times A Day Per Liver Md 12)  Pepcid 20 Mg Tabs (Famotidine) .... Take 1 Tablet By Mouth Two Times A Day 13)  Os-Cal 500 + D 500-400 Mg-Unit Tabs (Calcium Carbonate-Vitamin D) .... As Directed 14)  Boniva 150 Mg Tabs (Ibandronate Sodium) .... Once Monthly 15)  Oxycodone-Acetaminophen 10-325 Mg Tabs (Oxycodone-Acetaminophen) .... One Tablet By Mouth Every Six Hours As Needed For Pain 16)  Xifaxan 550 Mg Tabs (Rifaximin) .... Take 1 Tablet By Mouth Two Times A Day 17)  Nystatin 100000 Unit/gm Powd (Nystatin) .... Apply To Affected Area Bid 18)  Naltrexone Hcl 50 Mg Tabs (Naltrexone Hcl) .... Take 1 Tablet By Mouth Once A Day 19)  Flonase 50 Mcg/act Susp (Fluticasone Propionate) .... One Puff Per Nostril Qdaily  Allergies (verified): 1)  Codeine Phosphate (Codeine Phosphate)  Past History:  Past medical history reviewed for relevance  to current acute and chronic problems.  Past Medical History: Reviewed history from 05/02/2009 and no changes required. 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 Obese. Vital signs reviewed, hypertensive Head:  normocephalic and atraumatic.  no sinus tenderness, no TMJ tenderness Eyes:  no scleral icterus. PERRLA, EOMI vision grossly intact.   Ears:  External ear exam shows no significant lesions or deformities.  Otoscopic examination reveals clear canals, tympanic membranes are intact bilaterally without bulging, retraction, inflammation or discharge.  failed hearing screen Nose:  clear rhinorrhea, pale dry turbinates Mouth:  Oral mucosa and oropharynx without lesions or exudates.  Teeth in good repair. Neck:  No deformities, masses, or tenderness noted.  no LAD Lungs:  Normal respiratory effort, chest expands symmetrically. Lungs are clear to auscultation, no crackles or wheezes. Heart:  Normal rate and regular rhythm. S1 and S2 normal without gallop, murmur, click, rub or other extra sounds. Skin:  + spider angiomas throughout chest/back.  + dry skin, and erythematous papular diffuse rash throughout UE, LE and trunk.  Very pruritic.   Impression & Recommendations:  Problem # 1:  OTHER DISEASES OF NASAL CAVITY AND SINUSES (ICD-478.19) chronic sinusitis vs allergies.  Pt states previously on flonase, has been on loratadine long term.  Will treat as allergies and have pt f/u with PCP.  Not impressed for acute sinusitis - no fevers, no sig sinus tenderness on exam.  Change loratadine to cetirizine and use nightly as sedating and worst part is in am so hopefully will help with this as well. Orders: [facility]- Est Level  3 ([id number])  Problem # 2:  SKIN RASH (ICD-782.1) winter itch vs dry skin vs scabies.  discussed to have dog checked by vet to see if he has mites, if so call us and we will treat with permetrhin.  otherwise just try to encourage hydration of skin and discussed dry skin care.  To use humidifier to help skin and sinuses.  if continued complaint when returns with PCP, consider CMP and lab work up to r/o liver  causing this.  The following medications were removed from the medication list:    Nystatin 100000 Unit/gm Powd (Nystatin) .Marland Kitchen... Apply to affected area bid  Orders: [facility]- Est Level  3 ([id number])  Complete Medication List: 1)  Klonopin 1 Mg Tabs (Clonazepam) .Marland Kitchen.. 1 by mouth three times a day as needed for anxiety 2)  Hydroxyzine Hcl 25 Mg Tabs (Hydroxyzine hcl) .... Take 1 tablet by mouth four times a day 3)  Cetirizine Hcl 10 Mg Tabs (Cetirizine hcl) .... Take one by mouth at bedtime 4)  Spironolactone 25 Mg Tabs (Spironolactone) .... 2 by mouth daily 5)  Pataday 0.2 % Soln (Olopatadine hcl) .Marland Kitchen.. 1 drop in both eyes once daily disp: qs x one month 6)  Ibuprofen 400 Mg Tabs (Ibuprofen) .Marland Kitchen.. 1 by mouth three times a day as needed for pain 7)  Lasix 20 Mg Tabs (Furosemide) .... One tab by mouth qday 8)  Ursodiol 300 Mg Caps (Ursodiol) .Marland Kitchen.. 1 cap by mouth three times a day 9)  Coreg 6.25 Mg Tabs (Carvedilol) .Marland Kitchen.. 1 by mouth two times a day 10)  Zoloft 25 Mg Tabs (Sertraline hcl) .... Take 2 by mouth daily 11)  Rifadin 300 Mg Caps (Rifampin) .Marland Kitchen.. 1 by mouth two times a day per liver md 12)  Pepcid 20 Mg Tabs (Famotidine) .... Take 1 tablet by mouth two times a day 13)  Os-cal 500 + D 500-400 Mg-unit Tabs (Calcium carbonate-vitamin d) .... As directed 14)  Boniva 150 Mg Tabs (Ibandronate sodium) .... Once monthly 15)  Oxycodone-acetaminophen 10-325 Mg Tabs (Oxycodone-acetaminophen) .... One tablet by mouth every six hours as needed for pain 16)  Xifaxan 550 Mg Tabs (Rifaximin) .... Take 1 tablet by mouth two times a day 17)  Naltrexone Hcl 50 Mg Tabs (Naltrexone hcl) .... Take 1 tablet by mouth once a day 18)  Flonase 50 Mcg/act Susp (Fluticasone propionate) .... One puff per nostril qdaily  Patient Instructions: 1)  Take your dog to the vet to see if she could have mites.  If so, call us back and we will treat you at the same time.  Liikely this is just rashing from itching from dry skin.  Keep  working on moisturizing your skin - hydrating lotions, turn on humidifier, moisturize skin as much as you can. 2)  STOP loratadine 3)  START cetirizine in am and flonase for allergies. 4)  If not improving, return in 2 wks. Prescriptions: FLONASE 50 MCG/ACT SUSP (FLUTICASONE PROPIONATE) one puff per nostril qdaily  #1 x 1   Entered and Authorized by:   Eustaquio Boyden  MD   Signed by:   Eustaquio Boyden  MD on 10/04/2009   Method used:   Electronically to        [facility] [phone][facility] (retail)       [address]       [place], [place]  [address]       Ph: [phone] or [phone]       Fax: [phone]   RxID:   [id number] CETIRIZINE HCL 10 MG TABS (CETIRIZINE HCL) take one by mouth at bedtime  #30 x 3   Entered and Authorized by:   Eustaquio Boyden  MD   Signed by:   Eustaquio Boyden  MD on 10/04/2009   Method used:   Electronically to        [facility] [phone][facility] (retail)       [address]       [place], [place]  [address]       Ph: [phone] or [phone]       Fax: [phone]   RxID:   [id number]

## 2010-09-23 NOTE — Progress Notes (Signed)
Summary: phn msg   Phone Note Call from Patient Call back at [facility] Phone [phone]   Caller: Patient Summary of Call: Pt is returning Dr. Philis Pique phone call.  Would also like something called in something for sinues.  Her eye told her that the pressure behind her eyes was sinuses. Initial call taken by: Clydell Hakim,  October 02, 2009 2:05 PM  Follow-up for Phone Call        Please call patient and tell her that she must be seen if she thinks that she needs meds. It is our policy - we do not call in medications. Follow-up by: Helane Rima DO,  October 02, 2009 3:01 PM  Additional Follow-up for Phone Call Additional follow up Details #1::        states she has an appt on 23rd. states she has had this for "months" her eye dr told her sudafed. pharmacy told her no due to HTN.  she accepted an appt fri pm with [place]. unable to come tomorrow Additional Follow-up by: Golden Circle RN,  October 02, 2009 4:40 PM    Additional Follow-up for Phone Call Additional follow up Details #2::    wants to know what she can take for sinus pressure - has high BP and cannot come tomorrow - wants something she can get OTC Follow-up by: De Nurse,  October 03, 2009 12:08 PM  Additional Follow-up for Phone Call Additional follow up Details #3:: Details for Additional Follow-up Action Taken: to pcp. will call pt when md responds Additional Follow-up by: Golden Circle RN,  October 03, 2009 12:13 PM  Continue Ibuprofen and Claritin. Nasal irrigation may also help. Coricidin is the only decongestant recommended for people with HTN BUT it may worsen her liver, so I don't recommend any OTC meds for her. She must be seen so that we can evaluate her in order to recommend other medications. Helane Rima DO  October 03, 2009 4:17 PM   she did not want to come in & had cancelled her appt. told her she should be evaluated as it has been weeks & she is miserable with this. recheduled for 8:30 work  in.Golden Circle RN  October 03, 2009 4:31 PM

## 2010-09-23 NOTE — Consult Note (Signed)
Summary: [facility]   Imported By: Clydell Hakim 05/08/2009 11:43:25  _____________________________________________________________________  External Attachment:    Type:   Image     Comment:   External Document

## 2010-09-23 NOTE — Letter (Signed)
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 All vaccines (including live vaccines) can be given safely to children or adults with an absent or dysfunctional spleen due to trauma.   Sincerely,   Helane Rima DO   Appended Document: Vaccination Letter mailed.

## 2010-09-23 NOTE — Progress Notes (Signed)
Summary: Triage   Phone Note Call from Patient Call back at Home Phone [phone]   Caller: Patient Call For: Dr. Leone Payor Reason for Call: Talk to Nurse Summary of Call: Requesting to speak directly to nurse....wants to know if Dr. Leone Payor would take over her care so she will not have to go to [facility] Initial call taken by: Karna Christmas,  June 12, 2010 12:56 PM  Follow-up for Phone Call        Dr Leone Payor please advise Darcey Nora RN, [facility]  June 12, 2010 1:02 PM  Additional Follow-up for Phone Call Additional follow up Details #1::        I am her [facility] GI doctor and will and do see her. If she needs hepatitis C Tx would be from Dr. Julieta Gutting - other [facility] doctors but there is a clinic here in [place] for that. She can likely follow with me and see them if needed. She should find out what intentions there are for treating HCV as last note indicated the new therapies might be appropriate for her - she needs to find this out but I will see her for her liver problems otherwise. Additional Follow-up by: Iva Boop MD, Clementeen Graham,  June 13, 2010 9:07 AM    Additional Follow-up for Phone Call Additional follow up Details #2::    Patient  aware.  She is waiting for her [facility] assistance decision.  She is advised that we will see her for her GI care, but can't treat for HCV.  I have asked her to contact [facility] about transfereing to the [facility].  She will call back to schedule an office visit once she has heard from [facility] about her assistance.  Patient  needs a EGD/colon. Follow-up by: Darcey Nora RN, CGRN,  June 13, 2010 9:24 AM

## 2010-09-23 NOTE — Assessment & Plan Note (Signed)
Summary: Behavioral Medicine Followup   Primary Care Provider:  Helane Rima DO   History of Present Illness: Wendy [name] presented with nothing for the agenda.  She did not write anything down per my request last visit.  I gave her feedback on the PHQ-9 (indicating significant symptoms of depression nearly every day of the week but no SI) and asked the question again, "what needs to happen to craft a better life for herself?"  She named the following:  1) Financial issues resolved. 2) Health issues resolved. 3) More stable relationships with her children. 4) A social life of some sort.  These are issues discussed before and so we looked more at things that she can and can't control.  Allergies: 1)  Codeine Phosphate (Codeine Phosphate)   Impression & Recommendations:  Problem # 1:  DEPRESSIVE DISORDER, NOS (ICD-311)  Dressed appropriately.  Mood is reported as depressed.  Affect is wnl.  She laughs on occasion.  No SI / HI reported.  Of note:  She said the celexa seemed to work after about 2-3 weeks but she is no longer noticing a benefit.  Reviewed record.  Would likely recommend a referral to [facility] for thorough review of her medication.  As far as I can tell, she has not taking anything that has consistently improved her mood.  Also of note:  Wendy [name] reported to me today that she is NOT SMOKING.  This is not reflected in the medical record and I did not follow-up on it.  Would recommend PCP follow-up to determine smoking status.    The financial issues are complex and very stressful.  It sounds as though she is working actively on it but has yet to break down the bureaucratic red tape to get the answers she needs.  Not sure how to help with this.  The Facilities manager at [facility] does NOT sound like a good match in this regard.  Determined that the easiest way to impact her mental health in a positive fashion may be getting her up out of bed (where she  lays and watches television most days) and interacting with people.  She is engaging and laughs regularly in our appts.  She has tried to volunteer in the past and has an interest in this.  She committed to reconnecting with this.  Additionally, as she was walking out, she identified that getting more involved in the church "that has never turned their back on [her]" would be helpful.  Offered a follow-up appt given the severity of her depression.  She elected to call to schedule as needed.   Her updated medication list for this problem includes:    Klonopin 1 Mg Tabs (Clonazepam) .Marland Kitchen... 1 by mouth three times a day as needed for anxiety    Hydroxyzine Hcl 25 Mg Tabs (Hydroxyzine hcl) .Marland Kitchen... Take 1 tablet by mouth four times a day    Celexa 20 Mg Tabs (Citalopram hydrobromide) .Marland Kitchen... 1 tab by mouth daily for depression  Orders: Therapy 40-50- min- [facility] ([address])  Complete Medication List: 1)  Klonopin 1 Mg Tabs (Clonazepam) .Marland Kitchen.. 1 by mouth three times a day as needed for anxiety 2)  Hydroxyzine Hcl 25 Mg Tabs (Hydroxyzine hcl) .... Take 1 tablet by mouth four times a day 3)  Cetirizine Hcl 10 Mg Tabs (Cetirizine hcl) .... Take one by mouth at bedtime 4)  Spironolactone 25 Mg Tabs (Spironolactone) .... 2 by mouth daily 5)  Pataday 0.2 % Soln (Olopatadine  hcl) .... 1 drop in both eyes once daily disp: qs x one month 6)  Ibuprofen 400 Mg Tabs (Ibuprofen) .Marland Kitchen.. 1 by mouth three times a day as needed for pain 7)  Lasix 20 Mg Tabs (Furosemide) .... One tab by mouth qday 8)  Ursodiol 300 Mg Caps (Ursodiol) .Marland Kitchen.. 1 cap by mouth three times a day 9)  Coreg 6.25 Mg Tabs (Carvedilol) .Marland Kitchen.. 1 by mouth two times a day 10)  Celexa 20 Mg Tabs (Citalopram hydrobromide) .Marland Kitchen.. 1 tab by mouth daily for depression 11)  Rifadin 300 Mg Caps (Rifampin) .Marland Kitchen.. 1 by mouth two times a day per liver md 12)  Pepcid 20 Mg Tabs (Famotidine) .... Take 1 tablet by mouth two times a day 13)  Os-cal 500 + D 500-400 Mg-unit Tabs (Calcium  carbonate-vitamin d) .... As directed 14)  Boniva 150 Mg Tabs (Ibandronate sodium) .... Once monthly 15)  Oxycodone-acetaminophen 10-325 Mg Tabs (Oxycodone-acetaminophen) .... One tablet by mouth every six hours as needed for pain 16)  Xifaxan 550 Mg Tabs (Rifaximin) .... Take 1 tablet by mouth two times a day 17)  Naltrexone Hcl 50 Mg Tabs (Naltrexone hcl) .... Take 1 tablet by mouth once a day 18)  Flonase 50 Mcg/act Susp (Fluticasone propionate) .... One puff per nostril qdaily 19)  Tessalon Perles 100 Mg Caps (Benzonatate) .Marland Kitchen.. 1-2 by mouth three times a day as needed cough 20)  Doxycycline Hyclate 100 Mg Tabs (Doxycycline hyclate) .... One tab two times a day for 7 days

## 2010-09-23 NOTE — Progress Notes (Signed)
Summary: triage   Phone Note Call from Patient Call back at Home Phone [phone]   Caller: Patient Summary of Call: nothing has worked so far and would like antibiotic [facility] in - [facility] Initial call taken by: De Nurse,  October 28, 2009 11:55 AM  Follow-up for Phone Call        explained it is the clinic's policy that no antibiotics are called in. must be seen. she wanted an appt tomorrow am. will see Saxon at 10:30 Follow-up by: Golden Circle RN,  October 28, 2009 11:57 AM

## 2010-09-23 NOTE — Progress Notes (Signed)
Summary: refill  Medications Added CHANTIX STARTING MONTH PAK 0.5 MG X 11 & 1 MG X 42  MISC (VARENICLINE TARTRATE) 0.5mg  by mouth once daily for 3 days, then twice daily for 4 days and then 1mg  by mouth 2 times daily       Phone Note Refill Request Call back at Home Phone [phone] Message from:  Patient  Chantix  Initial call taken by: De Nurse,  October 07, 2009 1:49 PM  Follow-up for Phone Call        to pcp Follow-up by: Golden Circle RN,  October 07, 2009 1:52 PM    New/Updated Medications: CHANTIX STARTING MONTH PAK 0.5 MG X 11 & 1 MG X 42  MISC (VARENICLINE TARTRATE) 0.5mg  by mouth once daily for 3 days, then twice daily for 4 days and then 1mg  by mouth 2 times daily Prescriptions: CHANTIX STARTING MONTH PAK 0.5 MG X 11 & 1 MG X 42  MISC (VARENICLINE TARTRATE) 0.5mg  by mouth once daily for 3 days, then twice daily for 4 days and then 1mg  by mouth 2 times daily  #1 pack x 0   Entered and Authorized by:   Helane Rima DO   Signed by:   Helane Rima DO on 10/07/2009   Method used:   [facility]        [facility] [phone][facility] (retail[facility]       [address]       [place], [place]  [address]       Ph: [phone] or [phone]       Fax: [phone]   RxID:   [id number]

## 2010-09-23 NOTE — Progress Notes (Signed)
Summary: pls call   Phone Note Call from Patient Call back at Home Phone [phone]   Caller: Patient Summary of Call: needs to talk to Dr Earlene Plater about a personal matter Initial call taken by: De Nurse,  September 30, 2009 1:34 PM  Follow-up for Phone Call        called patient. no answer. left message. Follow-up by: Helane Rima DO,  October 02, 2009 11:48 AM

## 2010-09-23 NOTE — Consult Note (Signed)
Summary: [name]   Imported By: De Nurse 02/17/2010 14:13:17  _____________________________________________________________________  External Attachment:    Type:   Image     Comment:   External Document

## 2010-09-23 NOTE — Progress Notes (Signed)
Summary: phn msg   Phone Note Call from Patient Call back at Home Phone [phone]   Caller: Patient Summary of Call: Wendy [name] - antioxident wants to make sure it's OK to  start taking is vitamin again.  made from grape seeds  Initial call taken by: De Nurse,  April 08, 2009 11:44 AM  Follow-up for Phone Call        fwd. to dr.Terrisa Curfman for review. Follow-up by: Arlyss Repress CMA,,  April 08, 2009 11:46 AM  Additional Follow-up for Phone Call Additional follow up Details #1::        grape seed extract can increase bleeding time and this patient has a history of low platelets which also can increase bleeding. i would prefer that she okay this with her liver doctor.

## 2010-09-23 NOTE — Assessment & Plan Note (Signed)
Summary: f/u meds,df   Vital Signs:  Patient profile:   50 year old female Height:      62 inches Weight:      188 pounds BMI:     34.51 Temp:     98.3 degrees F oral BP sitting:   130 / 80  (left arm) Cuff size:   regular  Vitals Entered By: Jimmy Footman, CMA (April 17, 2010 10:35 AM) CC: med f/u Is Patient Diabetic? No   Primary Care Provider:  Helane Rima DO  CC:  med f/u.  History of Present Illness: 50 yo F:  1. Depression/Anxiety/Panic Attacks: Rx Celexa and Klonopin. She did see Dr. Pascal Lux a few times since our last visit, but feels that Dr. Pascal Lux did not think that they needed to meet anymore. She is still having issues with anhedonia. Her anxiety is still severe but controlled fairly well with the Rx Klonopin. She states that she missed her last visit [facility] last week) because she become so nervous as she was walking out of the door that she vomited once. No SI/HI. She has had many appointments lately, including Research officer, trade union, Dentist, Optho.   Habits & Providers  Alcohol-Tobacco-Diet     Tobacco Status: quit  Allergies (verified): 1)  Codeine Phosphate (Codeine Phosphate) PMH-FH-SH reviewed for relevance  Social History: Smoking Status:  quit  Review of Systems General:  Denies sleep disorder. Psych:  Complains of anxiety, depression, and panic attacks; denies suicidal thoughts/plans and thoughts /plans of harming others.  Physical Exam  General:  Obese, alert, NAD, cooperative, vitals reviewed.  Psych:  Oriented X3, memory intact for recent and remote, normally interactive, good eye contact, and dysphoric affect.  No SI or HI.   Impression & Recommendations:  Problem # 1:  DEPRESSIVE DISORDER, NOS (ICD-311) Assessment Unchanged  Increased Celexa. Rec f/u with Dr. Pascal Lux (she must call) or therapist as she seems to want more therapy and because Dr. Carola Rhine OV note did not state that the patient was not in need of further sessions. Her updated medication  list for this problem includes:    Klonopin 1 Mg Tabs (Clonazepam) .Marland Kitchen... 1 by mouth three times a day as needed for anxiety    Hydroxyzine Hcl 25 Mg Tabs (Hydroxyzine hcl) .Marland Kitchen... Take 1 tablet by mouth four times a day    Celexa 40 Mg Tabs (Citalopram hydrobromide) ..... One by mouth daily for depression  Orders: [facility]- Est Level  3 (95188)  Problem # 2:  ANXIETY (ICD-300.00) Assessment: Unchanged  Continue current treatment. See #1. Her updated medication list for this problem includes:    Klonopin 1 Mg Tabs (Clonazepam) .Marland Kitchen... 1 by mouth three times a day as needed for anxiety    Hydroxyzine Hcl 25 Mg Tabs (Hydroxyzine hcl) .Marland Kitchen... Take 1 tablet by mouth four times a day    Celexa 40 Mg Tabs (Citalopram hydrobromide) ..... One by mouth daily for depression  Orders: [facility]- Est Level  3 (41660)  Complete Medication List: 1)  Klonopin 1 Mg Tabs (Clonazepam) .Marland Kitchen.. 1 by mouth three times a day as needed for anxiety 2)  Hydroxyzine Hcl 25 Mg Tabs (Hydroxyzine hcl) .... Take 1 tablet by mouth four times a day 3)  Cetirizine Hcl 10 Mg Tabs (Cetirizine hcl) .... Take one by mouth at bedtime 4)  Spironolactone 25 Mg Tabs (Spironolactone) .... 2 by mouth daily 5)  Lasix 20 Mg Tabs (Furosemide) .... One tab by mouth qday 6)  Coreg 6.25 Mg Tabs (  Carvedilol) .Marland Kitchen.. 1 by mouth two times a day 7)  Celexa 40 Mg Tabs (Citalopram hydrobromide) .... One by mouth daily for depression 8)  Rifadin 300 Mg Caps (Rifampin) .Marland Kitchen.. 1 by mouth two times a day per liver md 9)  Pepcid 20 Mg Tabs (Famotidine) .... Take 1 tablet by mouth two times a day 10)  Os-cal 500 + D 500-400 Mg-unit Tabs (Calcium carbonate-vitamin d) .... As directed 11)  Boniva 150 Mg Tabs (Ibandronate sodium) .... Once monthly 12)  Oxycodone-acetaminophen 10-325 Mg Tabs (Oxycodone-acetaminophen) .... One tablet by mouth every six hours as needed for pain 13)  Xifaxan 550 Mg Tabs (Rifaximin) .... Take 1 tablet by mouth two times a day  Patient  Instructions: 1)  It was nice to see you again.  2)  I am increasing your Celexa and refilling your Klonopin. Prescriptions: KLONOPIN 1 MG  TABS (CLONAZEPAM) 1 by mouth three times a day as needed for anxiety  #90 x 0   Entered and Authorized by:   Helane Rima DO   Signed by:   Helane Rima DO on 04/17/2010   Method used:   Print then Give to Patient   RxID:   [id number] CELEXA 40 MG TABS (CITALOPRAM HYDROBROMIDE) one by mouth daily for depression  #30 x 3   Entered and Authorized by:   Helane Rima DO   Signed by:   Helane Rima DO on 04/17/2010   Method used:   Electronically to        [facility] [phone]* (retail)       [address]       [place], [place]  [address]       Ph: [phone] or [phone]       Fax: [phone]   RxID:   [id number]

## 2010-09-23 NOTE — Assessment & Plan Note (Signed)
Vital Signs:  Patient profile:   50 year old female Weight:      185.1 pounds Temp:     97.2 degrees F oral Pulse rate:   72 / minute BP sitting:   110 / 72  (left arm)  Vitals Entered By: Alphia Kava (April 19, 2009 8:55 AM) CC: f/u BP Is Patient Diabetic? No   Primary Care Provider:  Helane Rima DO  CC:  f/u BP.  History of Present Illness: 50 year old female with:  1. HTN: Rx coreg, spironolactone, lasix. admits that she has not been taking the lasix since it makes her urinate too much. today, bp at goal. patient states that it goes up at home when she gets stressed. denies CP, SOB, HA, vision changes. Endoreses LE edema x 1 day last week that resolved with lasix.  2. AK: on face, above lip, was frozen 07/27/08 but now back. wants frozen again.  3. Cirrhosis: will see specialist in September. no concerns re: this issue today.  Habits & Providers  Alcohol-Tobacco-Diet     Tobacco Status: never  Allergies: 1)  Codeine Phosphate (Codeine Phosphate)  Social History: Smoking Status:  never  Review of Systems       per HPI, otherwise negative  Physical Exam  General:  Well-developed, well-nourished, in no acute distress; alert, appropriate and cooperative throughout examination. Obese. Vital signs reviewed. Lungs:  Normal respiratory effort, chest expands symmetrically. Lungs are clear to auscultation, no crackles or wheezes. Heart:  Normal rate and regular rhythm. S1 and S2 normal without gallop, murmur, click, rub or other extra sounds. Pulses:  2+ DP pulses Extremities:  No clubbing, cyanosis, edema, or deformity noted  Psych:  Oriented X3, memory intact for recent and remote, normally interactive, good eye contact, not anxious appearing, and not depressed appearing.     Impression & Recommendations:  Problem # 1:  HYPERTENSION, BENIGN SYSTEMIC (ICD-401.1) Assessment Unchanged Controlled today. Educated patient re: use of LASIX for (1) BP, (2) fluid  overload, (3) intended use in conjunction with spironolactone in setting of cirrhosis, and (4) potassium balance with these medications. Will check BMP today since she has not been taking LAsix regularly. Encourage patient to continue to check BP at home. Will have nurses check her BP cuff today.  Her updated medication list for this problem includes:    Spironolactone 25 Mg Tabs (Spironolactone) .Marland Kitchen... 2 by mouth daily    Lasix 20 Mg Tabs (Furosemide) ..... One tab by mouth qday    Coreg 6.25 Mg Tabs (Carvedilol) .Marland Kitchen... 1 by mouth two times a day  Orders: [facility]- Est Level  3 (99213)Future Orders: Basic Met-[facility] ([id number]) ... 05/16/2009  Problem # 2:  ACTINIC KERATOSIS (ICD-702.0) Assessment: Deteriorated  Lesion on upper lip consistent with actinic keratosis.  Lesion frozen with liquid nitrogen x 3 without complications.  Orders: Cryo (1st lesion) benign - [facility] (17000)  Complete Medication List: 1)  Klonopin 1 Mg Tabs (Clonazepam) .Marland Kitchen.. 1 by mouth three times a day as needed for anxiety 2)  Hydroxyzine Hcl 25 Mg Tabs (Hydroxyzine hcl) .... Take 1 tablet by mouth four times a day 3)  Lactulose 10 Gm/62ml Soln (Lactulose) .... Take one to two teaspoonfuls up to five times daily to produce 2-3 soft stools a day disp: 2000 ml 4)  Loratadine 10 Mg Tabs (Loratadine) .... Take 1 tablet by mouth once a day 5)  Spironolactone 25 Mg Tabs (Spironolactone) .... 2 by mouth daily 6)  Remeron 15 Mg  Tabs (Mirtazapine) .... Take 3 tablets by mouth nightly 7)  Pataday 0.2 % Soln (Olopatadine hcl) .Marland Kitchen.. 1 drop in both eyes once daily disp: qs x one month 8)  Ibuprofen 400 Mg Tabs (Ibuprofen) .Marland Kitchen.. 1 by mouth three times a day as needed for pain 9)  Lasix 20 Mg Tabs (Furosemide) .... One tab by mouth qday 10)  Ursodiol 300 Mg Caps (Ursodiol) .Marland Kitchen.. 1 cap by mouth three times a day 11)  Coreg 6.25 Mg Tabs (Carvedilol) .Marland Kitchen.. 1 by mouth two times a day 12)  Zoloft 25 Mg Tabs (Sertraline hcl) .... Take 2 by mouth  daily 13)  Rifadin 300 Mg Caps (Rifampin) .Marland Kitchen.. 1 by mouth two times a day per liver md 14)  Pepcid 20 Mg Tabs (Famotidine) .... Take 1 tablet by mouth two times a day 15)  Os-cal 500 + D 500-400 Mg-unit Tabs (Calcium carbonate-vitamin d) .... As directed 16)  Ferrous Sulfate 325 (65 Fe) Mg Tbec (Ferrous sulfate) .... Per liver md 17)  Boniva 150 Mg Tabs (Ibandronate sodium) .... Once monthly 18)  Xifaxan 200 Mg Tabs (Rifaximin) .... Take 2 tablets by mouth three times a day  Patient Instructions: 1)  It was very nice to meet you today! 2)  I will let you know if your labs are abnormal. 3)  I look forward to seeing you in September!  Appended Document:  Patient stated that she did not have time to wait for labs to be drawn and left. Made future order.

## 2010-09-23 NOTE — Progress Notes (Signed)
Summary: triage   Phone Note Call from Patient Call back at Home Phone [phone]   Caller: Patient Summary of Call: Wondering if she can take ibophren and pain med at the same time for headache. Initial call taken by: Clydell Hakim,  October 16, 2009 2:42 PM  Follow-up for Phone Call        told her ibu is on her med list so ok to take. told her to make sure she takes it with food as it can be very irritating to stomach. she agreed. states she has a HA behind her eyes. advised to take & go lay down. call for another pappt if worse or ibu does not stop the HA. states her pain meds do not have tylenol in them due to liver issues Follow-up by: Golden Circle RN,  October 16, 2009 3:52 PM

## 2010-09-23 NOTE — Progress Notes (Signed)
Summary: TAlk to nurse   Phone Note Call from Patient Call back at Home Phone [phone]   Call For: Dr Leone Payor Summary of Call: Wants to talk to you about a call you made to her in the past. Initial call taken by: Leanor Kail [facility],  March 28, 2010 12:25 PM  Follow-up for Phone Call        Patient  has an appointment coming up in September to set up EGD, she would like to move it up because her Medicaid will run out August 31.  I have provided her the number to [facility] billing to set up discount billing through [facility].  We will leave her appointment as it is and she will contact me is she has any other questions or problems. Follow-up by: Darcey Nora RN, CGRN,  March 28, 2010 1:18 PM

## 2010-09-23 NOTE — Progress Notes (Signed)
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 needs a letter saying this mailed to her home. told her I will send this to her pcp Follow-up by: Golden Circle RN,  June 26, 2009 2:39 PM  Additional Follow-up for Phone Call Additional follow up Details #1::        I assume this question is due to her asplenia. I will precept this before sending the letter on Tuesday when I am in clinic. Additional Follow-up by: Helane Rima DO,  June 30, 2009 7:58 PM

## 2010-09-23 NOTE — Assessment & Plan Note (Signed)
Summary: f/u & flu shot & immunization,df   Vital Signs:  Patient profile:   50 year old female Height:      62 inches Weight:      188.31 pounds Temp:     97.6 degrees F oral Pulse rate:   96 / minute BP sitting:   170 / 105  (left arm)  Vitals Entered By: Terese Door (June 25, 2009 3:06 PM)  Serial Vital Signs/Assessments:  Time      Position  BP       Pulse  Resp  Temp     By 3:57 PM             162/110                        Helane Rima DO  Comments: BP taken with large cuff. Terese Door, CMA  June 25, 2009 4:04 PM   By: Helane Rima DO   CC: HTN, edema, depression, anxiety, rash Is Patient Diabetic? No Pain Assessment Patient in pain? yes     Location: hip Intensity: 4 Type: dull   Primary Care Provider:  Helane Rima DO  CC:  HTN, edema, depression, anxiety, and rash.  History of Present Illness: 50 yo WF with  1. HTN: Rx Rx coreg, spironolactone, lasix. bp elevated today. patient admits that she has not taken lasix in 4 days, usually take twice weekly. patient states that BP goes up at home when she gets stressed and when she is in pain. she took her BP at home last night and states that it was "much lower" but cannot give me any numbers. denies CP, SOB, HA, vision changes.   2. R LE Edema: increased x 1 week, hip surgery < 3 months ago. denies leg pain, skin changes, dyspnea, cough, CP.   3. Depression: Rx Zoloft. brought [facility] evaluation in today for review. will reschedule with Dr. Pascal Lux. still experiencing lack of interest in most activities. no SI/HI.  4. Anxiety: endorses panic attacks when she goes out of the house. states that it can be very difficult to make herself come to the [facility] because of this. Rx: Klonopin 1 mg three times a day as needed, usually takes 1-2 daily.  5. Rash: left groin, intermittent x several months, itchy, odor.   Habits & Providers  Alcohol-Tobacco-Diet     Tobacco Status: quit > 6 months  Current  Medications (verified): 1)  Klonopin 1 Mg  Tabs (Clonazepam) .Marland Kitchen.. 1 By Mouth Three Times A Day As Needed For Anxiety 2)  Hydroxyzine Hcl 25 Mg Tabs (Hydroxyzine Hcl) .... Take 1 Tablet By Mouth Four Times A Day 3)  Loratadine 10 Mg Tabs (Loratadine) .... Take 1 Tablet By Mouth Once A Day 4)  Spironolactone 25 Mg  Tabs (Spironolactone) .... 2 By Mouth Daily 5)  Pataday 0.2 %  Soln (Olopatadine Hcl) .Marland Kitchen.. 1 Drop in Both Eyes Once Daily Disp: Qs X One Month 6)  Ibuprofen 400 Mg Tabs (Ibuprofen) .Marland Kitchen.. 1 By Mouth Three Times A Day As Needed For Pain 7)  Lasix 20 Mg  Tabs (Furosemide) .... One Tab By Mouth Qday 8)  Ursodiol 300 Mg  Caps (Ursodiol) .Marland Kitchen.. 1 Cap By Mouth Three Times A Day 9)  Coreg 6.25 Mg Tabs (Carvedilol) .Marland Kitchen.. 1 By Mouth Two Times A Day 10)  Zoloft 25 Mg Tabs (Sertraline Hcl) .... Take 2 By Mouth Daily 11)  Rifadin 300 Mg Caps (Rifampin) .Marland KitchenMarland KitchenMarland Kitchen  1 By Mouth Two Times A Day Per Liver Md 12)  Pepcid 20 Mg Tabs (Famotidine) .... Take 1 Tablet By Mouth Two Times A Day 13)  Os-Cal 500 + D 500-400 Mg-Unit Tabs (Calcium Carbonate-Vitamin D) .... As Directed 14)  Boniva 150 Mg Tabs (Ibandronate Sodium) .... Once Monthly 15)  Oxycodone-Acetaminophen 10-325 Mg Tabs (Oxycodone-Acetaminophen) .... One Tablet By Mouth Every Six Hours As Needed For Pain 16)  Xifaxan 550 Mg Tabs (Rifaximin) .... Take 1 Tablet By Mouth Two Times A Day 17)  Nystatin 100000 Unit/gm Powd (Nystatin) .... Apply To Affected Area Bid  Allergies (verified): 1)  Codeine Phosphate (Codeine Phosphate)  Past History:  Past medical, surgical, family and social histories (including risk factors) reviewed, and no changes noted (except as noted below).  Past Medical History: Reviewed history from 05/02/2009 and no changes required. 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 unknown  Social History: Reviewed history from 02/04/2009 and no changes required. In AA, last drink of EtOH 11/06; widowed; previously married x 64yrs; husband died of heart problems; lives alone;  2 children  (b. 1989, 45); unemployed  on disability; +tobacco  1/2ppd x 25years; ; denies ellicit drugs; previously a Engineer, site prior to head trauma in 2004; 5 total sexual partners in her life; occasionally sexually active currently; daugther with micropthalmia, visual impairment, chromosome abnormality, other daughter recently had baby at age 82, difficult relationship, getting better in school  Review of Systems       The patient complains of peripheral edema, suspicious skin lesions, and depression.  The patient denies fever, weight loss, weight gain, chest pain, dyspnea on exertion, prolonged cough, headaches, and abdominal pain.    Physical Exam  General:  Well-developed, well-nourished, in no acute distress; alert, appropriate and cooperative throughout examination. Obese. Vital signs reviewed. Lungs:  Normal respiratory effort, chest expands symmetrically. Lungs are clear to auscultation, no crackles or wheezes. Heart:  Normal rate and regular rhythm. S1 and S2 normal without gallop, murmur, click, rub or other extra sounds. Pulses:  2+ DP pulses Extremities:  trace right pedal edema.   Neurologic:  alert & oriented X3.   Skin:   erythematous area with satellite lesions left groin. no open areas. Psych:  Oriented X3, memory intact for recent and remote, normally interactive, good eye contact, and not anxious appearing.     Impression & Recommendations:  Problem # 1:  HYPERTENSION, BENIGN SYSTEMIC (ICD-401.1) Assessment Deteriorated  Unusually high today. No signs of end-organ damage. Advised patient to restart Lasix daily. I hesitate to add or increase any medications today since she is usually 110s/70s. She will continue to monitor at home and call with high pressures. Her updated medication list for this problem includes:    Spironolactone 25 Mg Tabs (Spironolactone) .Marland Kitchen... 2 by mouth daily    Lasix 20 Mg Tabs (Furosemide) ..... One tab by mouth qday    Coreg 6.25 Mg Tabs (Carvedilol) .Marland Kitchen... 1 by mouth two times a day  Orders: [facility]- Est  Level 4 (16109)  Problem # 2:  LEG EDEMA, RIGHT (ICD-782.3) Assessment: New  No red flags. Likely due to relatively recent right hip replacement. Will monitor. Her updated medication list for this problem includes:    Spironolactone 25 Mg Tabs (Spironolactone) .Marland Kitchen... 2 by mouth daily    Lasix 20 Mg Tabs (Furosemide) ..... One tab by mouth qday  Orders: [facility]- Est  Level 4 (16109)  Problem # 3:  DEPRESSIVE DISORDER, NOS (ICD-311) Assessment: Unchanged  Patient to make appointment with Dr. Pascal Lux. Will review psychiatric evaluation from [facility].  Her updated medication list for this problem includes:    Klonopin 1 Mg Tabs (Clonazepam) .Marland Kitchen... 1 by mouth three times a day as needed for anxiety    Hydroxyzine Hcl 25 Mg Tabs (Hydroxyzine hcl) .Marland Kitchen... Take 1 tablet by mouth four times a day    Zoloft 25 Mg Tabs (Sertraline hcl) .Marland Kitchen... Take 2 by mouth daily  Orders: [facility]- Est  Level 4 (60454)  Problem # 4:  ANXIETY (ICD-300.00) Assessment: Unchanged  See #3.  Her updated medication list for this problem includes:    Klonopin 1 Mg Tabs (Clonazepam) .Marland Kitchen... 1 by mouth three times a day as  needed for anxiety    Hydroxyzine Hcl 25 Mg Tabs (Hydroxyzine hcl) .Marland Kitchen... Take 1 tablet by mouth four times a day    Zoloft 25 Mg Tabs (Sertraline hcl) .Marland Kitchen... Take 2 by mouth daily  Orders: [facility]- Est  Level 4 (09811)  Problem # 5:  INTERTRIGO, CANDIDAL (BJY-782.95) Assessment: New  Rx Nystatin.  Orders: [facility]- Est  Level 4 (99214)  Complete Medication List: 1)  Klonopin 1 Mg Tabs (Clonazepam) .Marland Kitchen.. 1 by mouth three times a day as needed for anxiety 2)  Hydroxyzine Hcl 25 Mg Tabs (Hydroxyzine hcl) .... Take 1 tablet by mouth four times a day 3)  Loratadine 10 Mg Tabs (Loratadine) .... Take 1 tablet by mouth once a day 4)  Spironolactone 25 Mg Tabs (Spironolactone) .... 2 by mouth daily 5)  Pataday 0.2 % Soln (Olopatadine hcl) .Marland Kitchen.. 1 drop in both eyes once daily disp: qs x one month 6)  Ibuprofen 400 Mg Tabs (Ibuprofen) .Marland Kitchen.. 1 by mouth three times a day as needed for pain 7)  Lasix 20 Mg Tabs (Furosemide) .... One tab by mouth qday 8)  Ursodiol 300 Mg Caps (Ursodiol) .Marland Kitchen.. 1 cap by mouth three times a day 9)  Coreg 6.25 Mg Tabs (Carvedilol) .Marland Kitchen.. 1 by mouth two times a day 10)  Zoloft 25 Mg Tabs (Sertraline hcl) .... Take 2 by mouth daily 11)  Rifadin 300 Mg Caps (Rifampin) .Marland Kitchen.. 1 by mouth two times a day per liver md 12)  Pepcid 20 Mg Tabs (Famotidine) .... Take 1 tablet by mouth two times a day 13)  Os-cal 500 + D 500-400 Mg-unit Tabs (Calcium carbonate-vitamin d) .... As directed 14)  Boniva 150 Mg Tabs (Ibandronate sodium) .... Once monthly 15)  Oxycodone-acetaminophen 10-325 Mg Tabs (Oxycodone-acetaminophen) .... One tablet by mouth every six hours as needed for pain 16)  Xifaxan 550 Mg Tabs (Rifaximin) .... Take 1 tablet by mouth two times a day 17)  Nystatin 100000 Unit/gm Powd (Nystatin) .... Apply to affected area bid  Other Orders: Influenza Vaccine NON MCR (62130)  Patient Instructions: 1)  It was very nice to see you today! 2)  I will give Dr. Pascal Lux the [facility] evaluation.  3)   I have sent your Nystatin to your pharmacy. 4)  Restart the Lasix and take it every  day! Continue to check your blood pressure at home. 5)  Please let me know if you have any questions or concerns. Prescriptions: NYSTATIN 100000 UNIT/GM POWD (NYSTATIN) apply to affected area bid  #1 bottle x 1   Entered and Authorized by:   Helane Rima DO   Signed by:   Helane Rima DO on 06/25/2009   Method used:   Electronically to        [facility] [phone]* (retail)       [address]       [place], [place]  [address]       Ph: [phone] or [phone]       Fax: [phone]   RxID:   [id number]   Prevention & Chronic Care Immunizations   Influenza vaccine: Fluvax Non-MCR  (06/25/2009)   Influenza vaccine due: 06/12/2008    Tetanus booster: 09/25/2003: Done.   Tetanus booster due: 09/24/2013    Pneumococcal vaccine: Not documented  Other Screening   Pap smear: NEGATIVE FOR INTRAEPITHELIAL LESIONS OR MALIGNANCY.  (11/14/2008)   Pap smear due: 10/17/2008    Mammogram: Done.  (10/28/2005)   Mammogram due: 10/29/2006   Smoking status: quit > 6 months  (06/25/2009)  Lipids   Total Cholesterol: 110  (01/31/2008)   LDL: 73  (01/31/2008)   LDL Direct: Not documented   HDL: 17  (01/31/2008)   Triglycerides: 99  (01/31/2008)  Hypertension   Last Blood Pressure: 170 / 105  (06/25/2009)   Serum creatinine: 0.9  (05/02/2009)   Serum potassium 4.6  (05/02/2009)    Hypertension flowsheet reviewed?: Yes   Progress toward BP goal: Deteriorated  Self-Management Support :   Personal Goals (by the next clinic visit) :      Personal blood pressure goal: 130/80  (05/03/2009)   Patient will work on the following items until the next clinic visit to reach self-care goals:     Medications and monitoring: take my medicines every day, bring all of my medications to every visit  (06/25/2009)     Eating: drink diet soda or water instead of juice or soda, eat more vegetables, use fresh or  frozen vegetables, eat foods that are low in salt, eat baked foods instead of fried foods, eat fruit for snacks and desserts, limit or avoid alcohol  (06/25/2009)    Hypertension self-management support: Written self-care plan  (06/25/2009)   Hypertension self-care plan printed.   Nursing Instructions: Give Flu vaccine today    Influenza Vaccine    Vaccine Type: Fluvax Non-MCR    Site: left deltoid    Mfr: GlaxoSmithKline    Dose: 0.5 ml    Route: IM    Given by: Terese Door    Exp. Date: 06/302/011    Lot #: [id number]    VIS given: 04/02/2009  Flu Vaccine Consent Questions    Do you have a history of severe allergic reactions to this vaccine? no    Any prior history of allergic reactions to egg and/or gelatin? no    Do you have a sensitivity to the preservative Thimersol? no    Do you have a past history of Guillan-Barre Syndrome? no    Do you currently have an acute febrile illness? no    Have you ever had a severe reaction to latex? no    Vaccine information given and explained to patient? yes    Are you currently pregnant? no

## 2010-09-23 NOTE — Consult Note (Signed)
Summary: [facility]   Imported By: Clydell Hakim 04/09/2009 12:20:10  _____________________________________________________________________  External Attachment:    Type:   Image     Comment:   External Document

## 2010-09-23 NOTE — Consult Note (Signed)
Summary: [facility]   Imported By: Clydell Hakim 06/12/2009 12:17:13  _____________________________________________________________________  External Attachment:    Type:   Image     Comment:   External Document

## 2010-09-25 NOTE — Progress Notes (Signed)
   Phone Note Call from Patient   Caller: Patient Call For: [phone] Summary of Call: Pt have been terminated off disability and need carvediol 5.25 mg.  Was change to Neomyacin at [facility].  Want to know if provider can tell her if there is a way she can get a discount for these.  Pt will contact Jaynee Eagles to get assistance to obtain meds from [facility].  Want to know if [facility] will be able to fill rx for those meds.   Initial call taken by: Abundio Miu,  August 27, 2010 3:20 PM  Follow-up for Phone Call        Unfortunately, patient must contact [facility] about those questions. Carvedilol is generic. I don't know of another way to obtain medications. Follow-up by: Helane Rima DO,  August 27, 2010 3:47 PM  Additional Follow-up for Phone Call Additional follow up Details #1::        LVM for patient to call back regarding meds Additional Follow-up by: Abundio Miu,  September 03, 2010 4:45 PM

## 2010-09-25 NOTE — Progress Notes (Signed)
Summary: UPDATE PROBLEM LIST   

## 2010-09-26 NOTE — Consult Note (Signed)
Summary: [facility] Psychology Assessment  Diagnostic Impressions   Imported By: De Nurse 06/27/2009 11:37:07  _____________________________________________________________________  External Attachment:    Type:   Image     Comment:   External Document

## 2010-10-02 ENCOUNTER — Other Ambulatory Visit: Payer: Self-pay | Admitting: Family Medicine

## 2010-10-02 ENCOUNTER — Telehealth (INDEPENDENT_AMBULATORY_CARE_PROVIDER_SITE_OTHER): Payer: Self-pay | Admitting: *Deleted

## 2010-10-02 DIAGNOSIS — F419 Anxiety disorder, unspecified: Secondary | ICD-10-CM

## 2010-10-02 MED ORDER — CLONAZEPAM 1 MG PO TABS: 1.0000 mg | ORAL_TABLET | Freq: Three times a day (TID) | ORAL | Status: DC | PRN

## 2010-10-06 ENCOUNTER — Other Ambulatory Visit: Payer: Self-pay | Admitting: Family Medicine

## 2010-10-06 DIAGNOSIS — L299 Pruritus, unspecified: Secondary | ICD-10-CM

## 2010-10-06 MED ORDER — HYDROXYZINE HCL 25 MG PO TABS
25.0000 mg | ORAL_TABLET | Freq: Four times a day (QID) | ORAL | Status: DC
Start: 2010-10-06 — End: 2011-05-25

## 2010-10-09 NOTE — Progress Notes (Signed)
  Phone Note Other Incoming   Request: Send information Summary of Call: Request for records received from [facility]. Request forwarded to [facility].  08/2007-Gessner

## 2010-11-30 LAB — COMPREHENSIVE METABOLIC PANEL
ALT: 46 U/L — ABNORMAL HIGH (ref 0–35)
AST: 41 U/L — ABNORMAL HIGH (ref 0–37)
AST: 67 U/L — ABNORMAL HIGH (ref 0–37)
AST: 80 U/L — ABNORMAL HIGH (ref 0–37)
Albumin: 1.7 g/dL — ABNORMAL LOW (ref 3.5–5.2)
Albumin: 1.7 g/dL — ABNORMAL LOW (ref 3.5–5.2)
Albumin: 2.9 g/dL — ABNORMAL LOW (ref 3.5–5.2)
Alkaline Phosphatase: 120 U/L — ABNORMAL HIGH (ref 39–117)
Alkaline Phosphatase: 170 U/L — ABNORMAL HIGH (ref 39–117)
BUN: 13 mg/dL (ref 6–23)
BUN: 16 mg/dL (ref 6–23)
BUN: 18 mg/dL (ref 6–23)
CO2: 30 mEq/L (ref 19–32)
CO2: 27 mEq/L (ref 19–32)
Calcium: 7.5 mg/dL — ABNORMAL LOW (ref 8.4–10.5)
Chloride: 105 mEq/L (ref 96–112)
Chloride: 106 mEq/L (ref 96–112)
Chloride: 107 mEq/L (ref 96–112)
Creatinine, Ser: 0.8 mg/dL (ref 0.4–1.2)
Creatinine, Ser: 0.83 mg/dL (ref 0.4–1.2)
Creatinine, Ser: 0.87 mg/dL (ref 0.4–1.2)
Creatinine, Ser: 0.74 mg/dL (ref 0.4–1.2)
GFR calc Af Amer: >60 mL/min (ref >60)
GFR calc Af Amer: >60 mL/min (ref >60)
GFR calc Af Amer: >60 mL/min (ref >60)
GFR calc non Af Amer: >60 mL/min (ref >60)
GFR calc non Af Amer: >60 mL/min (ref >60)
Glucose, Bld: 104 mg/dL — ABNORMAL HIGH (ref 70–99)
Potassium: 4.2 mEq/L (ref 3.5–5.1)
Potassium: 4.4 mEq/L (ref 3.5–5.1)
Potassium: 3.9 mEq/L (ref 3.5–5.1)
Sodium: 138 mEq/L (ref 135–145)
Total Bilirubin: 0.8 mg/dL (ref 0.3–1.2)
Total Bilirubin: 0.9 mg/dL (ref 0.3–1.2)
Total Bilirubin: 1.1 mg/dL (ref 0.3–1.2)
Total Protein: 4.4 g/dL — ABNORMAL LOW (ref 6.0–8.3)
Total Protein: 4.4 g/dL — ABNORMAL LOW (ref 6.0–8.3)

## 2010-11-30 LAB — CULTURE, BLOOD (ROUTINE X 2): Culture: NO GROWTH

## 2010-11-30 LAB — BASIC METABOLIC PANEL
BUN: 17 mg/dL (ref 6–23)
CO2: 30 mEq/L (ref 19–32)
Calcium: 7.7 mg/dL — ABNORMAL LOW (ref 8.4–10.5)
Chloride: 110 mEq/L (ref 96–112)
Chloride: 103 mEq/L (ref 96–112)
Chloride: 110 mEq/L (ref 96–112)
Creatinine, Ser: 0.94 mg/dL (ref 0.4–1.2)
Creatinine, Ser: 1.02 mg/dL (ref 0.4–1.2)
GFR calc Af Amer: >60 mL/min (ref >60)
GFR calc non Af Amer: >60 mL/min (ref >60)
GFR calc non Af Amer: >60 mL/min (ref >60)
Glucose, Bld: 108 mg/dL — ABNORMAL HIGH (ref 70–99)
Glucose, Bld: 126 mg/dL — ABNORMAL HIGH (ref 70–99)
Potassium: 4.4 mEq/L (ref 3.5–5.1)
Sodium: 134 mEq/L — ABNORMAL LOW (ref 135–145)

## 2010-11-30 LAB — PROTIME-INR
INR: 1.3 (ref 0.00–1.49)
INR: 1.6 — ABNORMAL HIGH (ref 0.00–1.49)
INR: 1.8 — ABNORMAL HIGH (ref 0.00–1.49)
INR: 5.3 (ref 0.00–1.49)
INR: 6 (ref 0.00–1.49)
INR: 1 (ref 0.00–1.49)
Prothrombin Time: 21.6 seconds — ABNORMAL HIGH (ref 11.6–15.2)
Prothrombin Time: 54.1 seconds — ABNORMAL HIGH (ref 11.6–15.2)
Prothrombin Time: 59.4 seconds — ABNORMAL HIGH (ref 11.6–15.2)
Prothrombin Time: 32.6 seconds — ABNORMAL HIGH (ref 11.6–15.2)

## 2010-11-30 LAB — CBC
HCT: 28.1 % — ABNORMAL LOW (ref 36.0–46.0)
HCT: 28.9 % — ABNORMAL LOW (ref 36.0–46.0)
HCT: 21.7 % — ABNORMAL LOW (ref 36.0–46.0)
Hemoglobin: 10 g/dL — ABNORMAL LOW (ref 12.0–15.0)
Hemoglobin: 7.5 g/dL — CL (ref 12.0–15.0)
MCHC: 34.9 g/dL (ref 30.0–36.0)
MCV: 106.1 fL — ABNORMAL HIGH (ref 78.0–100.0)
MCV: 107.2 fL — ABNORMAL HIGH (ref 78.0–100.0)
MCV: 108.3 fL — ABNORMAL HIGH (ref 78.0–100.0)
MCV: 108.3 fL — ABNORMAL HIGH (ref 78.0–100.0)
MCV: 105.1 fL — ABNORMAL HIGH (ref 78.0–100.0)
MCV: 118.7 fL — ABNORMAL HIGH (ref 78.0–100.0)
Platelets: 112 10*3/uL — ABNORMAL LOW (ref 150–400)
Platelets: 162 10*3/uL (ref 150–400)
Platelets: 130 10*3/uL — ABNORMAL LOW (ref 150–400)
RBC: 2.59 MIL/uL — ABNORMAL LOW (ref 3.87–5.11)
RBC: 2.67 MIL/uL — ABNORMAL LOW (ref 3.87–5.11)
RBC: 2.68 MIL/uL — ABNORMAL LOW (ref 3.87–5.11)
RBC: 2.68 MIL/uL — ABNORMAL LOW (ref 3.87–5.11)
RBC: 3.49 MIL/uL — ABNORMAL LOW (ref 3.87–5.11)
RDW: 24.7 % — ABNORMAL HIGH (ref 11.5–15.5)
RDW: 15.8 % — ABNORMAL HIGH (ref 11.5–15.5)
RDW: 25.8 % — ABNORMAL HIGH (ref 11.5–15.5)
WBC: 10.6 10*3/uL — ABNORMAL HIGH (ref 4.0–10.5)
WBC: 11.7 10*3/uL — ABNORMAL HIGH (ref 4.0–10.5)
WBC: 13.2 10*3/uL — ABNORMAL HIGH (ref 4.0–10.5)
WBC: 9.5 10*3/uL (ref 4.0–10.5)
WBC: 5.7 10*3/uL (ref 4.0–10.5)
WBC: 9.5 10*3/uL (ref 4.0–10.5)

## 2010-11-30 LAB — URINALYSIS, ROUTINE W REFLEX MICROSCOPIC
Glucose, UA: NEGATIVE mg/dL
Nitrite: NEGATIVE
Specific Gravity, Urine: 1.002 — ABNORMAL LOW (ref 1.005–1.030)
pH: 6.5 (ref 5.0–8.0)

## 2010-11-30 LAB — TYPE AND SCREEN: ABO/RH(D): B POS

## 2010-11-30 LAB — ABO/RH: ABO/RH(D): B POS

## 2010-11-30 LAB — URINE MICROSCOPIC-ADD ON

## 2010-11-30 LAB — PREPARE RBC (CROSSMATCH)

## 2010-12-22 ENCOUNTER — Other Ambulatory Visit: Payer: Self-pay | Admitting: Family Medicine

## 2010-12-22 DIAGNOSIS — F419 Anxiety disorder, unspecified: Secondary | ICD-10-CM

## 2010-12-22 MED ORDER — CLONAZEPAM 1 MG PO TABS: 1.0000 mg | ORAL_TABLET | Freq: Three times a day (TID) | ORAL | Status: DC | PRN

## 2011-01-06 NOTE — H&P (Signed)
Wendy [name], Wendy [name]            ACCOUNT NO.:  [id number]   MEDICAL RECORD NO.:  [id number]          PATIENT TYPE:  INP   LOCATION:  1613                         FACILITY:  [facility]   PHYSICIAN:  Madlyn Frankel. Charlann Boxer, M.D.  DATE OF BIRTH:  March 10, 1961   DATE OF ADMISSION:  03/25/2008  DATE OF DISCHARGE:                              HISTORY & PHYSICAL   CHIEF COMPLAINTS:  Right hip pain.   HISTORY OF PRESENT ILLNESS:  This is a 50 year old female who fell in a  grocery store and had immediate moderate right hip pain.  Evaluation and  x-ray in the emergency department revealed a right basocervical  fracture.  Care was transferred to orthopedic services to Dr. Charlann Boxer for  definitive treatment.   PAST MEDICAL HISTORY:  Includes hypertension, hepatitis C cirrhosis.   FAMILY HISTORY:  Noncontributory.   SOCIAL HISTORY:  Nonsmoker and nondrinker.   DRUG ALLERGIES:  CODEINE.   MEDICATIONS:  1. Klonopin 1 mg p.o. t.i.d. p.r.n.  2. Hydroxyzine 25 mg p.o. q.i.d.  3. Lactulose 10 grams per 15 mL one to two teaspoons up to five times      a day.  4. Loratadine 10 mg p.o. daily.  5. Naltrexone 50 mg p.o. daily.  6. Spironolactone 50 mg one p.o. b.i.d.  7. Remeron 50 mg two tabs p.o. q.h.s.  8. Ibuprofen 800 mg p.o. t.i.d. p.r.n.  9. Lasix 20 mg p.o. daily.  10.Pantoprazole 40 mg p.o. every 30 minutes before a meal.   REVIEW OF SYSTEMS:  RESPIRATORY:  A recent bout of pneumonia which has  cleared.  Otherwise see HPI.   PHYSICAL EXAMINATION:  VITAL SIGNS:  Pulse 84, respirations 16, blood  pressure 110/73.  GENERAL:  Awake, alert, and oriented, well developed, well nourished.  NECK:  Supple.  No carotid bruits.  CHEST:  Lungs are clear to auscultation bilaterally.  BREASTS:  Deferred.  HEART:  Regular rate and rhythm.  S1-S2 distinct.  ABDOMEN:  Soft.  Bowel sounds present.  GENITOURINARY:  Deferred.  EXTREMITIES:  Dorsalis pedis pulse positive.  Right hip has increased  pain with range of  motion.  SKIN:  No cellulitis.  No skin breakdown.  NEUROLOGIC:  Intact distal sensibilities.   LABORATORY DATA:  Labs:  CBC:  White blood cells 9.3, hemoglobin 12.5,  hematocrit 36.9, platelets 168.  Metabolic:  Sodium 141, potassium 4,  creatinine 0.79, glucose 79.  All others pending.   RADIOLOGY:  X-rays:  1. Right femur shows a basocervical fracture.  2. Chest x-ray:  No active disease.   IMPRESSION:  Right basocervical right femur fracture.   PLAN OF ACTION:  Percutaneous screw versus open reduction internal  fixation by surgeon, Dr. Durene Romans.  The risks and complications were  discussed.   PLANNED COURSE OF STAY:  Two to three days.     ______________________________  Yetta Glassman [address], [place]      Madlyn Frankel. Charlann Boxer, M.D.  Electronically Signed    BLM/MEDQ  D:  03/26/2008  T:  03/26/2008  Job:  [id number]

## 2011-01-06 NOTE — Discharge Summary (Signed)
Wendy [name], [name]            ACCOUNT NO.:  [id number]   MEDICAL RECORD NO.:  [id number]          PATIENT TYPE:  INP   LOCATION:  1613                         [facility]ACILITY:  [facility]   PHYSICIAN:  Madlyn Frankel. Charlann Boxer, M.D.  DATE OF BIRTH:  1961-06-27   DATE OF ADMISSION:  03/25/2008  DATE OF DISCHARGE:  03/30/2008                               DISCHARGE SUMMARY   ADMISSION DIAGNOSES:  1. Hypertension.  2. Hepatitis C with cirrhosis.   DISCHARGE DIAGNOSES:  1. Osteopenia status post right femur fracture.  2. Hypertension.  3. Hepatitis C cirrhosis.   HISTORY OF PRESENT ILLNESS:  A 50 year old female who fell in a grocery  store and immediate to moderate right hip pain. X-ray in the emergency  department revealed right basocervical fracture, transferred to  orthopedic service for definitive surgical management.   CONSULTS:  None.   PROCEDURE:  Open reduction internal fixation of right femur basocervical  neck fracture.   SURGEON:  Madlyn Frankel. Charlann Boxer, M.D.   ASSISTANT:  Yetta Glassman. Mann, P.A.-C.   LABORATORY DATA:  Upon admission, her CBC was:  Hematocrit 36.9,  platelets 168; at the time of discharge, hematocrit 29.5 and platelets  140. Routine chemistry upon admission:  Sodium 141, potassium 4,  creatinine 0.79, glucose 79; at the time of discharge after  replenishment of potassium, her potassium was 3.3, sodium 141,  creatinine 0.81, glucose 109. UA performed in the emergency department  negative for infection.   Cardiology:  EKG - normal sinus rhythm.   Radiology:  Chest, 1 view - no active cardiopulmonary disease.  Postoperative right hip showed satisfactory appearance of right  intertrochanteric femur fracture with compression screw.   HOSPITAL COURSE:  The patient admitted to the hospital through the  emergency department to orthopedic surgery. She underwent surgery on  August 3. She made limited progress with physical therapy in the  hospital. She does not have any help  at home. Did not qualify for [facility]  rehabilitation. Her dressing was changed on a daily basis. No  significant drainage from the wound. She is neurovascularly intact in  the right lower extremity. She was partial weight bearing 50%. She  remained afebrile, hemodynamically stable throughout. When we saw her on  Thursday, there was potential she would go home. However, she did not  have help. Therefore, skilled nursing facility was necessary. She did  have a little bit of low potassium on August 6. I gave her some K-Dur to  replenish. She again made limited progress. No further events. When I  saw her Friday morning, changed her dressing, discussed SNF placement  which she was agreeable to either in [facility] or [place].   DISCHARGE DISPOSITION:  Discharged to skilled nursing facility rehab  stable in improved condition.   DISCHARGE DIET:  Regular as tolerated.   DISCHARGE WOUND CARE:  Keep wound dry.   DISCHARGE MEDICATIONS:  1. Klonopin 1 mg p.o. t.i.d. p.r.n.  2. Hydroxyzine 25 mg p.o. q.i.d. with actually 1 tablet in a.m., 1 in      the afternoon, and 2 tablets at bedtime.  3. Lactulose in  a solution of 10 g/15 mL. She takes 1 to 2 teaspoons      up to 5 times daily.  4. Loratadine 10 mg p.o. daily.  5. Naltrexone 50 mg p.o. daily.  6. Spironolactone 25 mg p.o. b.i.d.  7. Remeron 50 mg 2 tablets at bedtime.  8. Lasix 20 mg p.o. daily.  9. Pantoprazole 40 mg p.o. 30 minutes before meal.  10.Enteric-coated aspirin 325 mg p.o. daily for 6 weeks.  11.Robaxin 500 mg p.o. q.6h. muscle spasm pain.  12.Colace 100 mg p.o. daily while on narcotics.  13.MiraLax 17 g p.o. daily while on narcotics.  14.Iron 325 mg p.o. up to t.i.d. for 2 weeks.  15.Vicodin 7.5/325 one to two p.o. q.4-6h. p.r.n. pain.   DISCHARGE PHYSICAL THERAPY:  She is partial weight bearing 50% with the  use of a rolling walker. Want to increase strength and balance and  encouraged independence of activities  of daily living.   DISCHARGE FOLLOWUP:  Follow up with Dr. Charlann Boxer at phone number [phone] in  2 weeks for wound check and staple removal.     ______________________________  Yetta Glassman. [address], [place]      Madlyn Frankel. Charlann Boxer, M.D.  Electronically Signed    BLM/MEDQ  D:  03/30/2008  T:  03/30/2008  Job:  [id number]

## 2011-01-06 NOTE — Op Note (Signed)
NAMEMARCUS, GROLL            ACCOUNT NO.:  [id number]   MEDICAL RECORD NO.:  [id number]          PATIENT TYPE:  INP   LOCATION:  1613                         FACILITY:  [facility]   PHYSICIAN:  Madlyn Frankel. Charlann Boxer, M.D.  DATE OF BIRTH:  02/12/1961   DATE OF PROCEDURE:  03/26/2008  DATE OF DISCHARGE:                               OPERATIVE REPORT   PREOPERATIVE DIAGNOSIS:  Comminuted right basocervical/intertrochanteric  femur fracture.   POSTOPERATIVE DIAGNOSIS:  Comminuted right  basocervical/intertrochanteric femur fracture.   FINDINGS:  Hip fracture with findings more consistent with an  intertrochanteric femur fracture with comminution to the inferior aspect  of the femoral neck into the lesser trochanteric region as well as some  comminution of the greater trochanter. Very unstable fracture pattern  with a tendency to slide with the apex posterior.   PROCEDURE:  Open reduction internal fixation of right intertrochanteric  femur fracture utilizing a DePuy TK-2 plate, 4-hole side plate at 469  degrees with a 90-mm lag screw.   SURGEON:  Madlyn Frankel. Charlann Boxer, M.D.   ASSISTANT:  Yetta Glassman. Mann, P.A.-C.   ANESTHESIA:  General.   BLOOD LOSS:  200.   COMPLICATIONS:  None.   INDICATIONS FOR PROCEDURE:  [name] is a 50 year old female who  unfortunately fell in the grocery store  on March 25, 2008. She was  admitted to the hospital for fracture. She was admitted to the hospital,  medical history reviewed, and consent obtained for open reduction  internal fixation.   Based on her age, initial evaluation had raised some concern about a  basocervical fracture. The preoperative plan consisted of a discussion  of percutaneous screw fixation versus side plate as I did not want to  use an intramedullary device in this young female.   Consent was obtained for the benefit of pain relief and fracture healing  with the risks including nonunion, malunion, infection, DVT. Consent was  obtained.   PROCEDURE IN DETAIL:  The patient was brought to the operative theater.  Once adequate anesthesia and preoperative antibiotics, Ancef, were  administered, 2 g, the patient was positioned supine on the fracture  table. The left lower extremity, the nonaffected extremity, was flexed  and abducted and all bony prominences padded. The right foot was placed  in traction. Under fluoroscopic imaging, I identified fracture reduction  maneuvers, identifying the true nature of the fracture. It did require  the use of a constant pressure from the posterior buttock area to get  her out of this apex posterior. I used a nonsterile crutch outside the  shower curtain technique to accomplish this.   At this point, the right hip was prepped and draped in a sterile fashion  with shower curtain technique. Fluoroscopic images were used to identify  landmarks. Lateral incision was made basically from the vastus lateralis  to slightly proximal extending distally along the shaft of the femur.  The iliotibial band was opened, and the vastus lateralis elevated from  the posterior aspect of the femur intermuscular septum, and a retractor  was placed anterior to the femur. With the lateral femur exposed, the  guide was positioned into the extent of the shaft and into the center of  the femoral head in the AP and lateral planes.   I then measured the depth and determined to use a 90-mm lag screw,  drilled and then tapped this area. I then chose a 135-degree angle 4-  hole plate. The 90-mm lag screw with a4-hole side plate was then  inserted and tapped into position on the bone. I confirmed the location  of the plate using a turkey-claw clamp and found it was directly along  the shaft of the femur. At this point, after tapping the plate into the  bone, I placed 3 bicortical screws from distal to proximal. I then used  a compression screw to tighten down the lag screw.   With the fracture reduced nearly  anatomically, the comminution  immediately noted, and appreciated that there was gap present. The  remainder of the bone appeared to be anatomic in position.   Final radiographs in AP and lateral planes showed confirmation of  reduction. The wound was irrigated. I then reapproximated the iliotibial  band over the vastus lateralis with a #1 Vicryl in a running fashion.  The remainder of the wound was closed with 2-0 Vicryl and staples in the  skin. The skin was cleaned, dried and dressed sterilely with a sterile  dressing. She was brought to the recovery room in stable condition,  tolerated the procedure well.      Madlyn Frankel Charlann Boxer, M.D.  Electronically Signed     MDO/MEDQ  D:  03/26/2008  T:  03/26/2008  Job:  [id number]

## 2011-01-06 NOTE — Op Note (Signed)
NAMECHASELYNN, KEPPLE            ACCOUNT NO.:  [id number]   MEDICAL RECORD NO.:  [id number]          PATIENT TYPE:  INP   LOCATION:  2550                         FACILITY:  [facility]   PHYSICIAN:  Eulas Post, MD    DATE OF BIRTH:  1960/12/25   DATE OF PROCEDURE:  03/05/2009  DATE OF DISCHARGE:                               OPERATIVE REPORT   PREOPERATIVE DIAGNOSIS:  Right basicervical femoral neck nonunion.   POSTOPERATIVE DIAGNOSES:  1. Right basicervical femoral neck nonunion.  2. 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   OPERATIVE PROCEDURE:  1. Treatment of right basicervical femoral neck nonunion with      hemiarthroplasty.  2. Removal of hardware, deep.   FIRST ASSISTANT:  Erskine Squibb B. Su Hilt, PA-C   PREOPERATIVE INDICATIONS:  Ms. Karelly Dewalt is a 50 year old woman  who has hepatitis C and cirrhosis and had a hip fracture approximately 1  year ago.  She has had ongoing pain, disability, and inability to walk  without severe groin pain as well as pain located laterally.  She had a  CAT scan, which demonstrated evidence for ongoing nonunion.  She also  had prominent hardware.  She elected to undergo the above-named  procedures.  The risks, benefits, and alternatives to operative  intervention were discussed with her preoperatively including but not  limited to the risks of infection, bleeding, nerve injury,  periprosthetic fracture, need for conversion to total hip arthroplasty,  leg length discrepancy, cardiopulmonary complications, blood clots,  stiffness, among others, and she was willing to proceed.  She had a  history of a tibia fracture that healed with a leg length discrepancy,  and she also wished to have her right leg lengthened as much as  possible.   OPERATIVE IMPLANTS:  We removed a DePuy TK2 sliding hip screw with a  total of 3 interlocking screws.  We implanted a DePuy noncemented AML 6-  inch small stature size 12 mm with a -3 tapered spacer and a size  44-mm  head hip ball.   OPERATIVE PROCEDURE:  The patient was brought to the operating room  placed in supine position.  General anesthesia was administered.  Intravenous Ancef 1 g was given.  She was turned into lateral decubitus  position, and the right lower extremity was prepped and draped in the  usual sterile fashion.  Incision was made through her previous incision  and also somewhat proximally.  Posterior lateral approach to the hip was  performed.  The external rotators along with the capsules was tagged  with fiber wire.  The hip was dislocated.  The distal interlocking  screws for the TK2 device were removed.  The proximal screw was not able  to be removed easily, and was stripping.  Therefore, the plate was  actually used as the wrench to remove the screw.  This was achieved with  some difficulty.  The overall bone quality was extremely sclerotic, and  quite hard.   Once the hardware had been removed, we then examined the femoral neck.  The site of nonunion was not immediately obvious.  The femoral head had  area that  appeared to be avascular necrosis at the apex of the femoral  head.  I actually performed a femoral neck cut in the standard fashion,  given that I could not appreciate the nonunion.  Nevertheless during the  preparation of the proximal femur, the nonunion site became clear.  The  nonunion extended directly to the level of the lesser troch, just  superiorly.  Thus the calcar was removed.  A sequential lateralizing  reamers were utilized in order to ensure lateralization of the broach.  This was extremely difficult, and took multiple rounds of effort, given  the severe sclerotic bone.  In fact I also used a bur to open the  proximal femur because the broaches were not effective given the severe  sclerosis.  Once I had achieved lateralization and opening of the  proximal femur, we sequentially broached.  I gave consideration to using  a cemented stem, but I was  able to get the AML broach into an  appropriate position and ultimately get it seated.  I had excellent  distal fixation.  The degree of difficulty with which it took to get the  broach down caused me to decide to ream line-to-line in order to  accommodate and ensure that I was not going to be too proud.   Therefore we elected to use the AML System, and the overall bone quality  was actually reasonably good, and was fairly difficult to broach, and  therefore I was more confident with a press-fit technique.  The broach  was seated and we initially trailed with a -3.  This was fairly tight,  and so this was removed, and we then re-broached and tried to seat the  prosthesis somewhat further down.  Ultimately, we were able to get it  further down, and the -3 was easily reducible.  This provided excellent  restoration of length.  I was hesitant to want to overlengthen her,  given that as I placed the -3 into position, the hip was fairly tight in  extension.  I did try once with the 0, but this was even more tight.  I  did also take an intraoperative x-ray to make sure that I was not  getting hung up either proximally or distally on cortical bone, and to  confirm the overall alignment and length.  This appeared to be  appropriate.  Therefore, we malleted the real prosthesis  into place.  Again, we trailed with the 0 as well as the -3, and 0 was exceedingly  tight.  Therefore, the -3 was selected.  This had excellent reduction  and excellent stability throughout functional range of motion.  I was  also concerned not to place undue stretch on the sciatic nerve.  I  suspected I did not achieve all of the restoration of leg length  overcoming her previous tibial malunion, but nevertheless her hip  musculature balance was appropriate.  Therefore, the -3 was impacted and  we then prepared a small wedge bone graft to be placed into the defect  on the greater trochanter from the cephalo-medullary  screw.  This was  impacted with the tamp and fit quite well.   We then irrigated the wound copiously and passed the FiberWire through  the greater trochanter for repair of the capsule and external rotators,  and Ethibond stitches also used in a capsule to close the T.   We then irrigated copiously again and closed the fascia with 0 Vicryl  followed by 2-0 Vicryl for the  subcutaneous tissue and 4-0 Monocryl for  the skin.  Steri-Strips and sterile gauze were applied.  Sterile  dressing was applied and she was turned supine and returned to the PACU  in stable and satisfactory condition.  Estimated blood loss is  approximately at 1100 mL.  There were no complications, and she  tolerated the procedure well.      Eulas Post, MD  Electronically Signed     JPL/MEDQ  D:  03/05/2009  T:  03/06/2009  Job:  [id number]

## 2011-01-06 NOTE — Discharge Summary (Signed)
Wendy [name], Wendy [name]            ACCOUNT NO.:  [id number]   MEDICAL RECORD NO.:  [id number]          PATIENT TYPE:  INP   LOCATION:  5031                         FACILITY:  [facility]   PHYSICIAN:  Eulas Post, MD    DATE OF BIRTH:  December 19, 1960   DATE OF ADMISSION:  03/05/2009  DATE OF DISCHARGE:  03/11/2009                               DISCHARGE SUMMARY   ADMISSION DIAGNOSES:  1. 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  2. Hepatitis C with cirrhosis.   DISCHARGE DIAGNOSES:  1. 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  2. Hepatitis C with cirrhosis.  3. Acute postoperative blood loss.  4. Hypersensitivity to Coumadin.   DISCHARGE MEDICATIONS:  1. Lovenox 40 mg subcutaneously daily until April 05, 2009.  2. Percocet 10/325 one to two tablets p.o. q.6 h. p.r.n. pain,      dispensing 100 tablets.  3. Valium 5 mg p.o. q.6 h. p.r.n. spasm, dispensing 50 tablets.  4. Phenergan as needed.  5. Resume home medications with the exception of the hydrocodone and      oxycodone.   HOSPITAL COURSE:  [name] is a 50 year old woman who has  hepatitis C and cirrhosis who previously had a basicervical femoral neck  fracture who had symptomatic hardware on the lateral side of her hip as  well as groin pain with a CT scan demonstrated nonunion of her beta  cervical femoral neck fracture.  She elected to undergo the above-named  procedures.  She went to the operating room on March 05, 2009, and  tolerated the procedure well.  She was placed initially on Coumadin, but  after only 2 days her INR was up in the 6 level, and was given vitamin  K.  She was subsequently started on Lovenox.  She was also given  sequential compression devices.  She had difficulty with ambulating  early on, but did finally make adequate progress.  She is planning to be  discharged home with followup with me in approximately 2 weeks.  Her  wounds were  clean and dry throughout her hospital stay.  She did have  acute postoperative blood loss anemia and was given 2 units of packed  red blood cells.  Her pulse was 111 and her hematocrit in approximately  be 21 range.  She was instructed on posterior hip precautions and  planned followup with me in 2 weeks.      Eulas Post, MD  Electronically Signed     JPL/MEDQ  D:  03/11/2009  T:  03/11/2009  Job:  [id number]

## 2011-01-06 NOTE — Assessment & Plan Note (Signed)
[facility]                         GASTROENTEROLOGY OFFICE NOTE   Wendy [name]                   MRN:          [id number]  DATE:08/03/2007                            DOB:          May 30, 1961    CHIEF COMPLAINT:  Dysphagia and reflux.   PROBLEMS:  1. Hepatitis C and alcoholism leading to cirrhosis, followed at [facility] by Dr. Julieta Gutting.  She has had problems with pruritus.  2. Prior splenectomy.  3. Hypertension.  4. Depression.  5. Allergic rhinosinusitis.  6. 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  She had benefit.      Question some dysmotility as well.  7. Erosive gastritis seen at same EGD, negative for H. pylori.   MEDICATIONS:  1. Cholestyramine daily.  2. Ursodiol 300 mg three times a day.  3. Hydroxyzine 25 mg daily.  4. Lactulose 10 mg daily.  5. Naltrexone 50 mg daily.  6. Protonix 40 mg daily.  7. Famotidine 20 mg b.i.d.  8. Loratadine 10 mg daily.  9. Tramadol 50 mg daily.  10.Chantix daily.  11.Rifampin 150 mg b.i.d.   DRUG ALLERGIES:  CODEINE.   HISTORY:  Wendy [name] is experiencing some intermittent solid food dysphagia  that is getting somewhat worse.  She did seem to have dilation effect at  her last EGD.  She is taking Famotidine on top of her Protonix.  She has  not had an EGD in a year at this time.  Her Meld score is only 7  fortunately, so she is not listed for transplant.  She is having  persistent pruritus which is tolerable but not resolved on the  medication she is on.  She is having a flare of low back pain as well.  Her weight has increased with time.  She is up 10 pounds from when I saw  her in August 2007.   PHYSICAL EXAMINATION:  VITAL SIGNS:  Height 5 feet 1 inches, weight 176  pounds, pulse 80, blood pressure 102/70.  EYES:  Appear anicteric at this time.  LUNGS:  Clear.  BACK:  There is no significant CVA  tenderness, though she is tender over  the lower thoracic and upper lumbar spine area.  This is mild.  HEART:  S1 S2.  No rubs, murmurs, or gallops.  ABDOMEN:  Obese, soft, nontender.  I do not detect hepatosplenomegaly.  NEUROLOGIC:  She is alert and oriented  x3.   ASSESSMENT:  1. Recurrent dysphagia, question from reflux or dysmotility or both,      i.e., question stenosis or stricture.  2. Hepatitis C, history of alcoholism with apparent cirrhosis followed      at [facility], main problem has been pruritus.   PLAN:  1. Barium swallow to evaluate motility.  2. EGD with possible repeat Maloney dilation as it helped her.  3. We will also screen for varices at that time.     Iva Boop, MD,FACG  Electronically Signed    CEG/MedQ  DD: 08/03/2007  DT: 08/03/2007  Job #:  [id number]   cc:   Ursula Beath, MD  Jason Coop, Dr.

## 2011-01-28 ENCOUNTER — Ambulatory Visit: Payer: Self-pay | Admitting: Family Medicine

## 2011-03-05 ENCOUNTER — Other Ambulatory Visit: Payer: Self-pay | Admitting: Family Medicine

## 2011-03-05 ENCOUNTER — Telehealth: Payer: Self-pay | Admitting: Family Medicine

## 2011-03-05 DIAGNOSIS — F419 Anxiety disorder, unspecified: Secondary | ICD-10-CM

## 2011-03-05 MED ORDER — CLONAZEPAM 1 MG PO TABS: 1.0000 mg | ORAL_TABLET | Freq: Three times a day (TID) | ORAL | Status: DC | PRN

## 2011-03-05 NOTE — Telephone Encounter (Signed)
Called and left a message. Pt will need a visit with me to get more than 1 refill of Clonazepam. Will f/u as needed.

## 2011-03-20 ENCOUNTER — Other Ambulatory Visit: Payer: Self-pay | Admitting: Family Medicine

## 2011-03-20 NOTE — Telephone Encounter (Signed)
Refill request

## 2011-04-07 ENCOUNTER — Other Ambulatory Visit: Payer: Self-pay | Admitting: Family Medicine

## 2011-04-07 ENCOUNTER — Encounter: Payer: Self-pay | Admitting: Family Medicine

## 2011-04-07 ENCOUNTER — Ambulatory Visit (INDEPENDENT_AMBULATORY_CARE_PROVIDER_SITE_OTHER): Payer: Self-pay | Admitting: Family Medicine

## 2011-04-07 VITALS — BP 144/82 | HR 84 | Temp 98.2°F | Ht 62.0 in | Wt 220.0 lb

## 2011-04-07 DIAGNOSIS — R609 Edema, unspecified: Secondary | ICD-10-CM

## 2011-04-07 DIAGNOSIS — F4001 Agoraphobia with panic disorder: Secondary | ICD-10-CM

## 2011-04-07 DIAGNOSIS — R600 Localized edema: Secondary | ICD-10-CM

## 2011-04-07 DIAGNOSIS — M549 Dorsalgia, unspecified: Secondary | ICD-10-CM

## 2011-04-07 DIAGNOSIS — F419 Anxiety disorder, unspecified: Secondary | ICD-10-CM

## 2011-04-07 MED ORDER — CLONAZEPAM 1 MG PO TABS: 1.0000 mg | ORAL_TABLET | Freq: Three times a day (TID) | ORAL | Status: DC | PRN

## 2011-04-07 MED ORDER — OXYCODONE HCL 5 MG PO TABS: 5.0000 mg | ORAL_TABLET | Freq: Three times a day (TID) | ORAL | Status: DC | PRN

## 2011-04-07 NOTE — Progress Notes (Signed)
1) Back Pain: Chronic x years. Notes pain in back with walking. Notes that when she goes to the grocery store she can walk further when draped over the cart. Additionally she has less pain when going up stairs than going down the stairs.  She denies any radicular pain, leg weakness, numbness or bowel or bladder dysfunction. She has been through PT for this pain. Additionally her hepatologist states that she cannot take tylenol or NSAIDS and she notes that tramadol does not work well.   2) Lymphedema: Chronic in both legs. This started after a hip surgery. She has been advised to keep her feet up and use compression stockings. She does not like the compression stockings as she thinks they are too tight. She is also seeing Dr. Dion Saucier for this issue. She denies any fevers or chills. Is also taking lasix for this problem.   3) Anxiety: Taking klonopin 0.5 up to 3x daily as needed and Celexa 40. She takes the klonopin when she feels like she is having a panic attack. Has been on this protocol for years now.   PMH reviewed.  ROS as above otherwise neg  Exam:  BP 144/82  Pulse 84  Temp(Src) 98.2 F (36.8 C) (Oral)  Ht 5\' 2"  (1.575 m)  Wt 220 lb (99.791 kg)  BMI 40.24 kg/m2 Gen: Well NAD HEENT: EOMI,  MMM Lungs: CTABL Nl WOB Heart: RRR no MRG Abd: NABS, NT, ND Exts: 2-3+ edema Bl legs. Mild erythremia on right leg. Not hot or ulcerated or painful to the touch. PT/DP intact BL. Sensation intact.  MSK: No TTP over spinal midline or parapsinal groups. Straight leg raise and FABER WNL BL. Pain on pack extension. Normal back flexion.  Neuro: Reflex 1+ BL LEs knees and ankles = BL Gait: Normal.

## 2011-04-07 NOTE — Patient Instructions (Signed)
Come back before then 14th of next month for more pain medicine refills.  We will be stopping klonipin in the next year and dealing with your anxiety in a different manner.  Wear compression stockings. Dr. Dion Saucier will prescribe them.

## 2011-04-08 ENCOUNTER — Encounter: Payer: Self-pay | Admitting: Family Medicine

## 2011-04-08 NOTE — Assessment & Plan Note (Addendum)
Has had multiple therapies to treat this issue. I suspect that she has lumbar spinal stenosis based on her history and exam.  Not much therapy alternatives at this point. She is unable to take tylenol or NSAIDS per her liver failure. Additionally she

## 2011-04-08 NOTE — Assessment & Plan Note (Signed)
On celexa and klonipin.  I plan to change this anxiety regimen.  I plan to switch to zoloft +/- an additional agent (perhaps buspar) for control. I also plan to wean klonipin down to <30 a month. If her panic disorder is so poorly controlled that she needs to take 90 klonipin a month we need better control.  Patient's goals: Over the next month she will wean to 60 pills a month ( 2 a day). We will f/u in 1 month, and at that time we will change SSRIs.  Will follow.

## 2011-04-09 ENCOUNTER — Encounter: Payer: Self-pay | Admitting: Family Medicine

## 2011-04-09 DIAGNOSIS — R600 Localized edema: Secondary | ICD-10-CM | POA: Insufficient documentation

## 2011-04-09 NOTE — Assessment & Plan Note (Signed)
Seem to be a chronic issue that is not much changed today.  I advised to use compression stockings, keep feet elevated and to continue oral diuretics.  Will f/u and increase diuresis if not improving.

## 2011-04-09 NOTE — Assessment & Plan Note (Signed)
Difficult to adequately control on less than narcotics as she cannot take tylenol or NSAIDS. She has been to PT and had injections in the past.  Plan to start on oxycodone with out APAP #60 a month and follow.  Will try to get into pain management clinic for the possibility of facet injections.

## 2011-05-06 ENCOUNTER — Ambulatory Visit: Payer: Self-pay | Admitting: Family Medicine

## 2011-05-07 ENCOUNTER — Ambulatory Visit: Payer: Self-pay | Admitting: Family Medicine

## 2011-05-08 ENCOUNTER — Telehealth: Payer: Self-pay | Admitting: Family Medicine

## 2011-05-08 NOTE — Telephone Encounter (Signed)
Fwd. To PCP to address .Wendy [name]  

## 2011-05-08 NOTE — Telephone Encounter (Signed)
Pt has an appt on 9/20 and needs to know if she can get enough pain meds to last her until that appt.  Has an appt for her lymphodema on Tues., that is the reason she couldn't get to see him at last appt. pls call when ready

## 2011-05-13 NOTE — Telephone Encounter (Signed)
Our policy is to no leave Rx at the front desk for patients.  She will need a doctor visit.

## 2011-05-14 ENCOUNTER — Ambulatory Visit (INDEPENDENT_AMBULATORY_CARE_PROVIDER_SITE_OTHER): Payer: Self-pay | Admitting: Family Medicine

## 2011-05-14 ENCOUNTER — Telehealth: Payer: Self-pay | Admitting: Family Medicine

## 2011-05-14 ENCOUNTER — Encounter: Payer: Self-pay | Admitting: Family Medicine

## 2011-05-14 VITALS — BP 170/108 | HR 75 | Temp 98.0°F | Ht 62.0 in | Wt 213.0 lb

## 2011-05-14 DIAGNOSIS — K7460 Unspecified cirrhosis of liver: Secondary | ICD-10-CM

## 2011-05-14 DIAGNOSIS — R600 Localized edema: Secondary | ICD-10-CM

## 2011-05-14 DIAGNOSIS — F4001 Agoraphobia with panic disorder: Secondary | ICD-10-CM

## 2011-05-14 DIAGNOSIS — M549 Dorsalgia, unspecified: Secondary | ICD-10-CM

## 2011-05-14 DIAGNOSIS — I10 Essential (primary) hypertension: Secondary | ICD-10-CM

## 2011-05-14 DIAGNOSIS — R609 Edema, unspecified: Secondary | ICD-10-CM

## 2011-05-14 LAB — COMPLETE METABOLIC PANEL WITH GFR
AST: 85 U/L — ABNORMAL HIGH (ref 0–37)
BUN: 9 mg/dL (ref 6–23)
Calcium: 8.4 mg/dL (ref 8.4–10.5)
Chloride: 107 mEq/L (ref 96–112)
Creat: 0.71 mg/dL (ref 0.50–1.10)
GFR, Est African American: >60 mL/min (ref >60)
Total Bilirubin: 0.7 mg/dL (ref 0.3–1.2)

## 2011-05-14 LAB — CBC
HCT: 38.8 % (ref 36.0–46.0)
Platelets: 133 10*3/uL — ABNORMAL LOW (ref 150–400)
RBC: 3.47 MIL/uL — ABNORMAL LOW (ref 3.87–5.11)
RDW: 15.7 % — ABNORMAL HIGH (ref 11.5–15.5)
WBC: 5.9 10*3/uL (ref 4.0–10.5)

## 2011-05-14 MED ORDER — OXYCODONE HCL 5 MG PO TABS: 5.0000 mg | ORAL_TABLET | Freq: Three times a day (TID) | ORAL | Status: DC | PRN

## 2011-05-14 NOTE — Telephone Encounter (Signed)
Sensa is the dietary supplement she is taking.  She said Dr. Denyse Amass wanted her to call back and let him know what it was to determine whether or not she should continue taking it.

## 2011-05-14 NOTE — Assessment & Plan Note (Signed)
Chronic in nature. No real medical alternatives than opiates at this time. We'll refill oxycodone 60 tablets.  Followup in one month

## 2011-05-14 NOTE — Patient Instructions (Signed)
Thank you for coming in today. I will call you with the results of her lab. If her potassium is okay we are going to restart Aldactone. If we restarted it we are going to have to check her labs pretty frequently.

## 2011-05-14 NOTE — Assessment & Plan Note (Signed)
Blood pressures not in range today even on recheck. As noted in edema section I would like to resume spironolactone. We'll check basic metabolic panel as above restart spironolactone and potassium okay if not we'll increase Coreg. Followup in one month

## 2011-05-14 NOTE — Assessment & Plan Note (Signed)
Anxiety is well-controlled last month. As patient is hardly using her Klonopin and I don't feel that changing her therapy drastically is necessary at this time. However if she is using her Klonopin frequently will need to have better baseline control. followup in one month

## 2011-05-14 NOTE — Telephone Encounter (Signed)
Forward to Dr Denyse Amass for review

## 2011-05-14 NOTE — Assessment & Plan Note (Signed)
Being managed with limb wrapping. Doing pretty well. Also on Lasix, not on spironolactone. I would like to resume spironolactone, however I don't know what her potassium is. Plan to obtain a basic metabolic panel today. If potassium is within range we'll restart spironolactone and have patient come in for followup.

## 2011-05-14 NOTE — Progress Notes (Signed)
1) Leg edema: Due to lymphedema.  Is seeing a physical therapist who is rapid the legs.  This is working and it has reduced swelling.  [name] still has some swelling, but is overall better. She continues to experience pain in her legs.  She seen Dr. Dion Saucier for this.  2) back pain: Doing better last month with oxycodone without Tylenol.  She ran out in time for her previously scheduled visit that she was unable to keep.  She denies any fatigue or nausea with oxycodone.  No new weakness or numbness.   3) blood pressure.  Not checking at home. Not been taken spironolactone for several months as it was stopped she thinks by her liver doctor.  She is not 100% sure. She denies any chest pain syncope palpitations or dyspnea on exertion.  4) anxiety: Doing well only had to take very few Klonopin this month.  She thinks her anxiety is pretty well controlled her pill bottle confirms her story.   PMH reviewed.  ROS as above otherwise neg Medications reviewed.  Exam:  BP 170/108  Pulse 75  Temp(Src) 98 F (36.7 C) (Oral)  Ht 5\' 2"  (1.575 m)  Wt 213 lb (96.616 kg)  BMI 38.96 kg/m2 Gen: Well NAD HEENT: EOMI,  MMM Lungs: CTABL Nl WOB Heart: RRR no MRG Abd: NABS, NT, ND Exts: Chronic venous stasis evidence of bilateral lower extremity area and one to trace pitting edema bilateral pretibial area. MSK: No midline point tenderness over the spine. Bilateral lumbar paraspinal tenderness. Gait is painful, the patient is able to get up on the exam table but can walk in and out of the office.

## 2011-05-15 LAB — VITAMIN D 25 HYDROXY (VIT D DEFICIENCY, FRACTURES): Vit D, 25-Hydroxy: 31 ng/mL (ref 30–89)

## 2011-05-15 MED ORDER — SPIRONOLACTONE 25 MG PO TABS: 25.0000 mg | ORAL_TABLET | Freq: Every day | ORAL | Status: DC

## 2011-05-15 NOTE — Telephone Encounter (Signed)
Called patient back about #1 potassium level is 4.3 we'll start spironolactone at 25 mg we will check potassium on Monday for lab visit BMP.  #2 called patient back about weight loss medication it is Sensa.  I advised that it probably wouldn't cause harm but would likely not work very well. If her kidneys don't work well I would not recommend this product.  I advised her to try to get a refund

## 2011-05-18 ENCOUNTER — Other Ambulatory Visit: Payer: Self-pay

## 2011-05-18 ENCOUNTER — Telehealth: Payer: Self-pay | Admitting: Family Medicine

## 2011-05-18 NOTE — Telephone Encounter (Signed)
Fwd. To Dr.Corey to address. .Wendy [name]  

## 2011-05-18 NOTE — Telephone Encounter (Signed)
Wendy [name] is canceling her appt for labs today because the pharmacy was not able to fill the medication that she needed to take before that appt, but the directions on the meds were confusing and they needed more direction.  She will wait to reschedule her lab appt until she hears back from Dr. Denyse Amass.  She also is wanting to know what her Vitamin D level is.

## 2011-05-21 NOTE — Telephone Encounter (Signed)
Fwd. To Dr.Corey. .Wendy [name]  

## 2011-05-21 NOTE — Telephone Encounter (Signed)
Wendy [name] is calling back because she has not heard back from Dr. Denyse Amass just yet.  She also want the lab results sent to Dr. Laurene Footman in Hepatology in [place].

## 2011-05-22 LAB — BASIC METABOLIC PANEL
BUN: 10
BUN: 9
Chloride: 110
GFR calc Af Amer: >60
GFR calc non Af Amer: >60
Potassium: 3.3 — ABNORMAL LOW
Potassium: 3.5

## 2011-05-22 LAB — CBC
HCT: 29.5 — ABNORMAL LOW
HCT: 31 — ABNORMAL LOW
Hemoglobin: 12.5
Platelets: 140 — ABNORMAL LOW
Platelets: 149 — ABNORMAL LOW
RBC: 3.11 — ABNORMAL LOW
RDW: 15.2
WBC: 14.8 — ABNORMAL HIGH

## 2011-05-22 LAB — COMPREHENSIVE METABOLIC PANEL
ALT: 52 — ABNORMAL HIGH
Alkaline Phosphatase: 320 — ABNORMAL HIGH
CO2: 27
GFR calc non Af Amer: >60
Glucose, Bld: 79
Potassium: 4
Sodium: 141
Total Bilirubin: 1

## 2011-05-22 LAB — DIFFERENTIAL
Basophils Absolute: 0.1
Eosinophils Absolute: 0.2
Lymphs Abs: 4.4 — ABNORMAL HIGH
Neutro Abs: 3.4

## 2011-05-22 LAB — APTT: aPTT: 30

## 2011-05-22 LAB — PROTIME-INR
INR: 1.1
Prothrombin Time: 14.9

## 2011-05-23 ENCOUNTER — Encounter: Payer: Self-pay | Admitting: Family Medicine

## 2011-05-23 NOTE — Telephone Encounter (Signed)
Letter sent.

## 2011-05-24 ENCOUNTER — Other Ambulatory Visit: Payer: Self-pay | Admitting: Family Medicine

## 2011-05-25 ENCOUNTER — Other Ambulatory Visit: Payer: Self-pay | Admitting: Family Medicine

## 2011-05-25 NOTE — Telephone Encounter (Signed)
Refill request

## 2011-05-28 ENCOUNTER — Telehealth: Payer: Self-pay | Admitting: Family Medicine

## 2011-05-28 DIAGNOSIS — K7460 Unspecified cirrhosis of liver: Secondary | ICD-10-CM

## 2011-05-28 NOTE — Telephone Encounter (Addendum)
Ms. Bicknell is confused about what she need to do regarding having labwork.  Have not taken new med just prescribed because she want confirmation of whether she need to have labs after starting meds or before.  Please call her back with this.  Also wanted to know if refill was sent for her hydroxyZine for her liver.

## 2011-05-29 MED ORDER — HYDROXYZINE HCL 25 MG PO TABS: 25.0000 mg | ORAL_TABLET | Freq: Four times a day (QID) | ORAL | Status: DC | PRN

## 2011-05-29 NOTE — Telephone Encounter (Signed)
Called Ms. Deangelo back. She has filled but not yet started taking her spironolactone. Plan to start it today and come in Monday for lab visit.  Refilled hydroxyzine due to itching from cirrhosis.

## 2011-06-05 ENCOUNTER — Other Ambulatory Visit: Payer: Self-pay

## 2011-06-05 ENCOUNTER — Telehealth: Payer: Self-pay | Admitting: Family Medicine

## 2011-06-05 NOTE — Telephone Encounter (Signed)
[name] was unable to keep her lab appt due to transportation.   Want to let you know also that she has taken 3 spironolactone and if you needed to discuss with her what she should do next(?).  Will keep appt next Friday with you.

## 2011-06-08 NOTE — Telephone Encounter (Signed)
Advise coming to clinic today to get that lab checked

## 2011-06-09 ENCOUNTER — Other Ambulatory Visit: Payer: Self-pay

## 2011-06-09 ENCOUNTER — Telehealth: Payer: Self-pay | Admitting: *Deleted

## 2011-06-09 NOTE — Telephone Encounter (Signed)
Pt unable to make appt for labs today. I told her that the labs that he placed were the ones that he was wanting to discuss with her on Friday. Pt stated that she is accumulating too many bills and will have to come in on Friday to see him and have her labs drawn then.Wendy [name]

## 2011-06-09 NOTE — Telephone Encounter (Signed)
Told patient to keep appointment on Friday with Dr Denyse Amass and we will draw labs at that time since she cannot make it before then.Wendy [name], [name]

## 2011-06-12 ENCOUNTER — Encounter: Payer: Self-pay | Admitting: Family Medicine

## 2011-06-12 ENCOUNTER — Ambulatory Visit (INDEPENDENT_AMBULATORY_CARE_PROVIDER_SITE_OTHER): Payer: Self-pay | Admitting: Family Medicine

## 2011-06-12 VITALS — BP 124/80 | Temp 98.3°F | Ht 62.0 in | Wt 197.0 lb

## 2011-06-12 DIAGNOSIS — R600 Localized edema: Secondary | ICD-10-CM

## 2011-06-12 DIAGNOSIS — R609 Edema, unspecified: Secondary | ICD-10-CM

## 2011-06-12 DIAGNOSIS — I10 Essential (primary) hypertension: Secondary | ICD-10-CM

## 2011-06-12 DIAGNOSIS — M549 Dorsalgia, unspecified: Secondary | ICD-10-CM

## 2011-06-12 LAB — BASIC METABOLIC PANEL
Chloride: 104 mEq/L (ref 96–112)
Potassium: 4.3 mEq/L (ref 3.5–5.3)
Sodium: 137 mEq/L (ref 135–145)

## 2011-06-12 MED ORDER — OXYCODONE HCL 5 MG PO TABS: 5.0000 mg | ORAL_TABLET | Freq: Three times a day (TID) | ORAL | Status: DC | PRN

## 2011-06-12 NOTE — Assessment & Plan Note (Signed)
Stable today. No red flag signs or symptoms. Plan to refill oxycodone and to check urine drug screen. Followup in one month

## 2011-06-12 NOTE — Patient Instructions (Signed)
Thank you for coming in today. I will call you with your lab results.  Make sure to get that [employer] Card: Please see Rudell Cobb to qualify for reduced or free medical services within the [facility].  Call her at [phone] today. Come back and see me in 1 month.  Stay active.

## 2011-06-12 NOTE — Progress Notes (Signed)
Wendy [name] presents to clinic today to followup her lower extremity swelling and low back pain.  1) swelling: Started taking spironolactone one pill a day 2 weeks ago. She did not come in for her labs as per scheduled. She feels well. Her swelling has gone down a lot. No pain fevers chills.  2) low back pain: Continues to be quite bothersome. Is taking oxycodone as needed. She estimates that she has about 10 pills left. She last felt oxycodone this morning.  No weakness numbness bowel or bladder dysfunction.    PMH reviewed.  ROS as above otherwise neg Medications reviewed.  Exam:  BP 124/80  Temp(Src) 98.3 F (36.8 C) (Oral)  Ht 5\' 2"  (1.575 m)  Wt 197 lb (89.359 kg)  BMI 36.03 kg/m2 Gen: Well NAD HEENT: EOMI,  MMM Lungs: CTABL Nl WOB Heart: RRR no MRG Abd: NABS, NT, ND Exts: Non edematous BL  LE, warm and well perfused.  MSK: Entire spine nontender over midline. Bilateral lumbar paraspinal tenderness. Normal gait normal reflexes normal sensation and strength

## 2011-06-12 NOTE — Assessment & Plan Note (Signed)
Much improved on spironolactone. Plan to continue at current dose and recheck BMP today. Followup in one month

## 2011-06-13 LAB — DRUG SCREEN, URINE
Benzodiazepines.: NEGATIVE
Creatinine,U: 136.8 mg/dL
Phencyclidine (PCP): NEGATIVE
Propoxyphene: NEGATIVE

## 2011-06-16 LAB — OPIATE, QUANTITATIVE, URINE
Hydrocodone: NEGATIVE NG/ML
Morphine Urine: NEGATIVE NG/ML
Oxycodone - Total: 4983 NG/ML — ABNORMAL HIGH
Oxymorphone: 165 NG/ML — ABNORMAL HIGH

## 2011-06-17 ENCOUNTER — Ambulatory Visit: Payer: Self-pay | Admitting: Family Medicine

## 2011-06-24 ENCOUNTER — Other Ambulatory Visit: Payer: Self-pay | Admitting: Family Medicine

## 2011-06-24 DIAGNOSIS — F419 Anxiety disorder, unspecified: Secondary | ICD-10-CM

## 2011-06-24 MED ORDER — CLONAZEPAM 1 MG PO TABS: 1.0000 mg | ORAL_TABLET | Freq: Three times a day (TID) | ORAL | Status: DC | PRN

## 2011-07-03 ENCOUNTER — Other Ambulatory Visit: Payer: Self-pay | Admitting: *Deleted

## 2011-07-03 ENCOUNTER — Telehealth: Payer: Self-pay | Admitting: Family Medicine

## 2011-07-03 NOTE — Telephone Encounter (Signed)
Wendy [name] now has the [name] and she doesn't know how her prescriptions need to be handled.  She has several questions for Dr. Denyse Amass as well.  She is out of several of her mediciations.

## 2011-07-03 NOTE — Telephone Encounter (Signed)
Wendy [name] has called back because she thought someone tried to call her back.  She would like to speak to someone so that she can get her Rx's by Monday.

## 2011-07-03 NOTE — Telephone Encounter (Signed)
Called pt. Advised that her meds won't be filled today. I will send message to Dr.Corey with the refill request. Pt needs: Aldactone, Coreg and Neomycin refilled. Changed pt's pharmacy. Fwd. To Dr.Corey.Arlyss Repress

## 2011-07-04 MED ORDER — CARVEDILOL 6.25 MG PO TABS: 6.2500 mg | ORAL_TABLET | Freq: Two times a day (BID) | ORAL | Status: DC

## 2011-07-04 MED ORDER — NEOMYCIN SULFATE 500 MG PO TABS: 500.0000 mg | ORAL_TABLET | Freq: Two times a day (BID) | ORAL | Status: DC

## 2011-07-04 MED ORDER — SPIRONOLACTONE 25 MG PO TABS: 25.0000 mg | ORAL_TABLET | Freq: Every day | ORAL | Status: DC

## 2011-07-04 NOTE — Telephone Encounter (Signed)
Addended by: Rodolph Bong on: 07/04/2011 07:50 AM   Modules accepted: Orders

## 2011-07-04 NOTE — Telephone Encounter (Signed)
Called and will refill medications and see pt on the 19th

## 2011-07-06 ENCOUNTER — Telehealth: Payer: Self-pay | Admitting: Family Medicine

## 2011-07-06 NOTE — Telephone Encounter (Signed)
Called. Pt in bath. She will call back.

## 2011-07-06 NOTE — Telephone Encounter (Signed)
States that the    neomycin (MYCIFRADIN) 500 MG tablet   Cost over $200 at [facility] and thinks she needs to try going to the [facility].  Not sure what she should do.  Needs it down to $4 plan.

## 2011-07-06 NOTE — Telephone Encounter (Signed)
Wendy [name] called Dr. Denyse Amass back.  She will be home the rest of the day so he can call her back at his own convenience.

## 2011-07-06 NOTE — Telephone Encounter (Signed)
Fwd. To Dr.Corey 

## 2011-07-06 NOTE — Telephone Encounter (Signed)
Will forward to dr Denyse Amass

## 2011-07-08 NOTE — Telephone Encounter (Signed)
Patient calling back, says the health dept does not carry it and needs a cheaper alternate med called in.

## 2011-07-08 NOTE — Telephone Encounter (Signed)
Called back. Asked her to get [facility] to transfer Rx to the health department.

## 2011-07-08 NOTE — Telephone Encounter (Signed)
Forward to PCP for alternative Rx

## 2011-07-10 NOTE — Telephone Encounter (Signed)
Called back. She will investigate which exactly medicine she was taking that was cheaper. Will fill that one. She will call back with the specifics.

## 2011-07-10 NOTE — Telephone Encounter (Signed)
Pt is calling back about needing meds - still hasn't heard anything Also asking about checking with pharmaceutical about this

## 2011-07-13 ENCOUNTER — Ambulatory Visit: Payer: Self-pay | Admitting: Family Medicine

## 2011-07-13 ENCOUNTER — Telehealth: Payer: Self-pay | Admitting: Family Medicine

## 2011-07-13 DIAGNOSIS — G8929 Other chronic pain: Secondary | ICD-10-CM

## 2011-07-13 NOTE — Telephone Encounter (Signed)
Fwd. To PCP .Wendy [name]  

## 2011-07-13 NOTE — Telephone Encounter (Signed)
Wendy [name] canceled her appt for this morning but rescheduled for 12/3.  She also faxed over the list of medications and would like for the Referral for the Pain Clinic to be set up.

## 2011-07-15 NOTE — Telephone Encounter (Signed)
Referral in and routed to red team.

## 2011-07-22 ENCOUNTER — Telehealth: Payer: Self-pay | Admitting: Family Medicine

## 2011-07-22 NOTE — Telephone Encounter (Signed)
LMOM for pt to rt call [id number]. Appt is sched for 11/23/2011 at 9:00. Dr Wynn Banker [address]. Pain Clinic.

## 2011-07-23 ENCOUNTER — Encounter: Payer: Self-pay | Admitting: Family Medicine

## 2011-07-23 NOTE — Telephone Encounter (Signed)
Gave pt info regarding appt with Pain clinic.  Pt says that is too far off.  Need to have something earlier.  Also want to let Dr. Denyse Amass know that she is still having back pain.  Patient want to know what she's suppose to take instead while she's waiting for that appt.

## 2011-07-27 ENCOUNTER — Ambulatory Visit: Payer: Self-pay | Admitting: Family Medicine

## 2011-07-27 NOTE — Telephone Encounter (Signed)
This encounter was created in error - please disregard.

## 2011-07-27 NOTE — Telephone Encounter (Signed)
Error

## 2011-07-28 ENCOUNTER — Ambulatory Visit (INDEPENDENT_AMBULATORY_CARE_PROVIDER_SITE_OTHER): Payer: Self-pay | Admitting: Family Medicine

## 2011-07-28 ENCOUNTER — Encounter: Payer: Self-pay | Admitting: Family Medicine

## 2011-07-28 VITALS — BP 130/90 | HR 85 | Ht 62.0 in | Wt 196.2 lb

## 2011-07-28 DIAGNOSIS — K7682 Hepatic encephalopathy: Secondary | ICD-10-CM

## 2011-07-28 DIAGNOSIS — Z23 Encounter for immunization: Secondary | ICD-10-CM

## 2011-07-28 DIAGNOSIS — M549 Dorsalgia, unspecified: Secondary | ICD-10-CM

## 2011-07-28 DIAGNOSIS — K7290 Hepatic failure, unspecified without coma: Secondary | ICD-10-CM | POA: Insufficient documentation

## 2011-07-28 MED ORDER — METHOCARBAMOL 500 MG PO TABS: 500.0000 mg | ORAL_TABLET | Freq: Three times a day (TID) | ORAL | Status: AC

## 2011-07-28 NOTE — Patient Instructions (Signed)
Thank you for coming in today. Call you GI doctor about alternative to Neomycin.  We will switch to Methocarbamol from Oxycodone.  I provided 90 tablets with 1 refil.  See me in 1-2 months.  Take care and avoid the flu If you can.

## 2011-07-28 NOTE — Assessment & Plan Note (Signed)
Managed with lactulose and neomycin. I recommend that she contact her gastroenterologist to see if there's any alternatives to neomycin.  I suspect she'll have to increase her lactulose dose. Followup in one month

## 2011-07-28 NOTE — Progress Notes (Signed)
Wendy [name] presents to clinic today to followup her back pain and to discuss her hepatic encephalopathy.  1) chronic back pain: Currently on oxycodone 5 mg 60 tablets a month. She states that this does not control her pain very well but does cause significant constipation. She notes in the past that she had been on Robaxin and which did seem to control her pain pretty well. She wonders if she can switch to that today. She denies any weakness numbness bladder or bowel dysfunction.  2)  hepatic encephalopathy: Being managed by her gastroenterologist with neomycin and lactulose. However neomycin constantly $70 a month and she is having trouble affording it. Previously she was on rifampin but that was also extremely expensive.  She wonders if there is any alternative besides lactulose.   PMH reviewed.  ROS as above otherwise neg Medications reviewed. Current Outpatient Prescriptions  Medication Sig Dispense Refill  . calcium-vitamin D (OSCAL-500) 500-400 MG-UNIT per tablet as directed.       . carvedilol (COREG) 6.25 MG tablet Take 1 tablet (6.25 mg total) by mouth 2 (two) times daily with a meal.  60 tablet  6  . citalopram (CELEXA) 40 MG tablet TAKE 1 TABLET EVERY DAY  30 tablet  3  . clonazePAM (KLONOPIN) 1 MG tablet Take 1 tablet (1 mg total) by mouth 3 (three) times daily as needed.  90 tablet  2  . famotidine (PEPCID) 20 MG tablet Take 20 mg by mouth 2 (two) times daily.        . furosemide (LASIX) 20 MG tablet Take 20 mg by mouth daily.        Marland Kitchen lactulose (CHRONULAC) 10 GM/15ML solution Take 20 g by mouth 3 (three) times daily. Cannot remember dose managed by gastroenterology       . neomycin (MYCIFRADIN) 500 MG tablet Take 1 tablet (500 mg total) by mouth 2 (two) times daily.  60 tablet  3  . Prenatal Vit-Fe Fumarate-FA (PRENATAL FORMULA) 28-0.8 MG TABS Take 1 tablet by mouth daily.        Marland Kitchen spironolactone (ALDACTONE) 25 MG tablet Take 1 tablet (25 mg total) by mouth daily.  30 tablet  1  .  methocarbamol (ROBAXIN) 500 MG tablet Take 1 tablet (500 mg total) by mouth 3 (three) times daily.  90 tablet  1    Exam:  BP 130/90  Pulse 85  Ht 5\' 2"  (1.575 m)  Wt 196 lb 3.2 oz (88.996 kg)  BMI 35.89 kg/m2 Gen: Well NAD HEENT: EOMI,  MMM Lungs: CTABL Nl WOB Heart: RRR no MRG Abd: NABS, NT, mild distention Exts: Non edematous BL  LE, warm and well perfused. MSK: Nontender spinal midline. Bilateral lumbar paraspinal tenderness to palpation. Normal gait able to get on exam table by herself.

## 2011-07-28 NOTE — Assessment & Plan Note (Signed)
It seems that oxycodone is not a great medication for her back pain, as it never really seemed to control her pain well he did start causing side effects.  She is interested in transitioning to Robaxin, which I agree with. Plan to write for 90 tablets of 500 mg of Robaxin and watch for worsening mental status changes with this medication. Plan to followup in one month.

## 2011-08-13 ENCOUNTER — Telehealth: Payer: Self-pay | Admitting: Family Medicine

## 2011-08-13 NOTE — Telephone Encounter (Signed)
Pt asking to speak with MD re: her dental appt yesterday, dentist is wanting to talk with MD about prescribing pain meds but needs to discuss with Dr. Denyse Amass first.

## 2011-08-13 NOTE — Telephone Encounter (Signed)
Dr Denyse Amass called and left message with dental office saying it is ok for patient to have pain meds without tylenol because of her liver condition. Called patient and informed her that Dr Denyse Amass was calling to speak with the Dentist.Busick, [name]

## 2011-08-14 NOTE — Telephone Encounter (Signed)
Forward to Dr Corey 

## 2011-08-14 NOTE — Telephone Encounter (Signed)
Oxycodone without tylenol OK.

## 2011-08-14 NOTE — Telephone Encounter (Signed)
Pt called to say that the dentist hesitates to give her anything because of the liver disease and wants Dr Denyse Amass to prescribe something for her.

## 2011-08-19 ENCOUNTER — Telehealth: Payer: Self-pay | Admitting: Family Medicine

## 2011-08-19 ENCOUNTER — Other Ambulatory Visit: Payer: Self-pay | Admitting: Family Medicine

## 2011-08-19 NOTE — Telephone Encounter (Signed)
Will fwd. To Dr.Corey for advise. Lorenda Hatchet, Renato Battles

## 2011-08-19 NOTE — Telephone Encounter (Signed)
Ms. Vanamburg is calling because she said her dentist feels that Dr. Denyse Amass should prescribe pain medicine for the tooth pain she has.  Please give her a call.

## 2011-08-20 ENCOUNTER — Ambulatory Visit: Payer: Self-pay | Admitting: Family Medicine

## 2011-08-20 NOTE — Telephone Encounter (Signed)
Refill request

## 2011-08-20 NOTE — Telephone Encounter (Signed)
She will need to come in for a visit as her class of medications cannot be called in. Please call patient and let her know.

## 2011-08-20 NOTE — Telephone Encounter (Signed)
Pt cried on the phone. Double booked at 1:45 pm with Dr.Corey. One problem visit. Lorenda Hatchet, Renato Battles

## 2011-08-28 ENCOUNTER — Telehealth: Payer: Self-pay | Admitting: Internal Medicine

## 2011-08-28 ENCOUNTER — Ambulatory Visit: Payer: Self-pay | Admitting: Internal Medicine

## 2011-08-28 NOTE — Telephone Encounter (Signed)
Per Dr. Radene Knee charge

## 2011-09-20 ENCOUNTER — Other Ambulatory Visit: Payer: Self-pay | Admitting: Family Medicine

## 2011-09-20 NOTE — Telephone Encounter (Signed)
Refill request

## 2011-09-22 ENCOUNTER — Ambulatory Visit: Payer: Self-pay | Admitting: Internal Medicine

## 2011-09-22 ENCOUNTER — Encounter: Payer: Self-pay | Admitting: Internal Medicine

## 2011-10-06 ENCOUNTER — Other Ambulatory Visit (INDEPENDENT_AMBULATORY_CARE_PROVIDER_SITE_OTHER): Payer: Medicaid Other

## 2011-10-06 ENCOUNTER — Encounter: Payer: Self-pay | Admitting: Internal Medicine

## 2011-10-06 ENCOUNTER — Ambulatory Visit (INDEPENDENT_AMBULATORY_CARE_PROVIDER_SITE_OTHER): Payer: Medicaid Other | Admitting: Internal Medicine

## 2011-10-06 VITALS — BP 126/80 | HR 70 | Ht 62.0 in | Wt 198.0 lb

## 2011-10-06 DIAGNOSIS — R1310 Dysphagia, unspecified: Secondary | ICD-10-CM

## 2011-10-06 DIAGNOSIS — B182 Chronic viral hepatitis C: Secondary | ICD-10-CM | POA: Diagnosis not present

## 2011-10-06 DIAGNOSIS — K219 Gastro-esophageal reflux disease without esophagitis: Secondary | ICD-10-CM

## 2011-10-06 DIAGNOSIS — K7682 Hepatic encephalopathy: Secondary | ICD-10-CM

## 2011-10-06 DIAGNOSIS — K7460 Unspecified cirrhosis of liver: Secondary | ICD-10-CM

## 2011-10-06 DIAGNOSIS — K7290 Hepatic failure, unspecified without coma: Secondary | ICD-10-CM

## 2011-10-06 DIAGNOSIS — R1314 Dysphagia, pharyngoesophageal phase: Secondary | ICD-10-CM

## 2011-10-06 LAB — CBC WITH DIFFERENTIAL/PLATELET
Basophils Relative: 0.9 % (ref 0.0–3.0)
Eosinophils Absolute: 0.1 10*3/uL (ref 0.0–0.7)
MCHC: 32.8 g/dL (ref 30.0–36.0)
MCV: 120 fl — ABNORMAL HIGH (ref 78.0–100.0)
Monocytes Absolute: 0.9 10*3/uL (ref 0.1–1.0)
Neutrophils Relative %: 55.8 % (ref 43.0–77.0)
Platelets: 141 10*3/uL — ABNORMAL LOW (ref 150.0–400.0)

## 2011-10-06 LAB — PROTIME-INR: Prothrombin Time: 12.9 s — ABNORMAL HIGH (ref 10.2–12.4)

## 2011-10-06 MED ORDER — RIFAXIMIN 550 MG PO TABS: 550.0000 mg | ORAL_TABLET | Freq: Two times a day (BID) | ORAL | Status: DC

## 2011-10-06 MED ORDER — OMEPRAZOLE 20 MG PO CPDR: 20.0000 mg | DELAYED_RELEASE_CAPSULE | Freq: Every day | ORAL | Status: DC

## 2011-10-06 NOTE — Patient Instructions (Signed)
Please go to the basement upon leaving today to have your labs done. You have been scheduled for an Endoscopy with separate instructions given. Your prescription(s) has(have) been sent to your pharmacy for you to pick up (Omeprazole, Xifaxin).

## 2011-10-06 NOTE — Progress Notes (Signed)
Subjective:    Patient ID: Wendy [name], female    DOB: 26-Sep-1960, 51 y.o.   MRN: [id number]  HPI This middle-aged white woman presents today for followup of cirrhosis. There is a history of chronic hepatitis C, untreated and alcohol. She is abstinent from alcohol at this time. She also has heartburn and dysphagia. She was last seen in 2011. At that point her hepatologist had asked me to see her about arranging for a repeat upper endoscopy to look for varices in the setting of cirrhosis. She was on ensured and did not follow through. She has regained insurance, she now has Medicaid.  She is having heartburn problems, and some intermittent vague dysphagia to solid foods. This is a chronic recurrent situation. She is on Pepcid 20 mg twice a day at this time and not a PPI.  She has been on lactulose and neomycin for hepatic encephalopathy. She feels like she is forgetful at times but has not been confused. She has not gotten lost. She is tired and sleeps a fair amount. She was on Xifaxan at 1.1 she was insurance coverage moved over to lactulose and neomycin. He is not currently driving.  She has been obese, but has been losing some weight. She is not certain whether this is related to fluid losses, on diuretics, or other causes. It sounds like she has been trying to lose weight somewhat but is frustrated by her inability to exercise due to chronic low back pain and right hip pain. She is due to see her hepatologist, Dr. Julieta Gutting in March, 2013.  Allergies  Allergen Reactions  . Codeine Phosphate     REACTION: unspecified   Outpatient Prescriptions Prior to Visit  Medication Sig Dispense Refill  . calcium-vitamin D (OSCAL-500) 500-400 MG-UNIT per tablet 2 tablets 2 (two) times daily.       . carvedilol (COREG) 6.25 MG tablet Take 1 tablet (6.25 mg total) by mouth 2 (two) times daily with a meal.  60 tablet  6  . citalopram (CELEXA) 40 MG tablet TAKE 1 TABLET EVERY DAY  30 tablet  3  .  clonazePAM (KLONOPIN) 1 MG tablet Take 1 tablet (1 mg total) by mouth 3 (three) times daily as needed.  90 tablet  2  . furosemide (LASIX) 20 MG tablet Take 20 mg by mouth daily.        Marland Kitchen lactulose (CHRONULAC) 10 GM/15ML solution Take 20 g by mouth 3 (three) times daily. Cannot remember dose managed by gastroenterology       . Prenatal Vit-Fe Fumarate-FA (PRENATAL FORMULA) 28-0.8 MG TABS Take 1 tablet by mouth daily.        Marland Kitchen spironolactone (ALDACTONE) 25 MG tablet Take 1 tablet (25 mg total) by mouth daily.  30 tablet  3  . famotidine (PEPCID) 20 MG tablet Take 20 mg by mouth 2 (two) times daily.       Marland Kitchen neomycin (MYCIFRADIN) 500 MG tablet Take 1 tablet (500 mg total) by mouth 2 (two) times daily.  60 tablet  3   Past Medical History  Diagnosis Date  . Cirrhosis   . Hiatal hernia   . Esophageal stricture   . Anxiety disorder   . 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 Hepatitis c, chronic   . History of alcohol abuse   . Depression   . History of hip fracture   . Allergic rhinitis   . HTN (hypertension)    Past Surgical History  Procedure Date  . Orif hip fracture 2010    right  . Upper gastrointestinal endoscopy 08/05/2007    esophageal ring, hiatal hernia, portal gastropathy  . Splenectomy     age 25   History   Social History  . Marital Status: Widowed            .     Social History Main Topics  . Smoking status: Former Games developer  . Smokeless tobacco: None  . Alcohol Use: No  . Drug Use: No  . Sexually Active: No          Social History Narrative   Just before her husband died, she had a miscarriage and started to drink beer heavily.  She quit secondary to her liver disease and has been quit for several years.  She denies the use of drugs - prescription or otherwise.     Family History  Problem Relation Age of Onset  . Heart disease Mother   . Colon cancer Neg Hx        Review of Systems As per history of present illness. All other  systems negative at this time.    Objective:   Physical Exam General:  Well-developed, well-nourished and in no acute distress - obese Eyes:  anicteric. ENT:   Mouth and posterior pharynx free of lesions.  Neck:   supple w/o thyromegaly or mass.  Lungs: Clear to auscultation bilaterally. Heart:  S1S2, no rubs, murmurs, gallops. Abdomen:  soft, non-tender, no hepatosplenomegaly, hernia, or mass and BS+. Obese. Some lower abdominal striae, no prominent veins. No obvious fluid wave or ascites. Lymph:  no cervical or supraclavicular adenopathy. Extremities:   no edema Skin   no rash. + spider angiomata on upper anterior trunk Neuro:  A&O x 3. No asterixis Psych:  appropriate mood and  Affect.   Data Reviewed:  2011 hepatology note. Primary care notes. 2012 laboratory testing. As flowed in the EMR.  Lab Results  Component Value Date   WBC 5.9 05/14/2011   HGB 13.6 05/14/2011   HCT 38.8 05/14/2011   MCV 111.8* 05/14/2011   PLT 133* 05/14/2011     Chemistry      Component Value Date/Time   NA 137 06/12/2011 1029   K 4.3 06/12/2011 1029   CL 104 06/12/2011 1029   CO2 26 06/12/2011 1029   BUN 16 06/12/2011 1029   CREATININE 0.94 06/12/2011 1029   CREATININE 0.9 05/02/2009 1436      Component Value Date/Time   CALCIUM 9.1 06/12/2011 1029   ALKPHOS 155* 05/14/2011 1028   AST 85* 05/14/2011 1028   ALT 45* 05/14/2011 1028   BILITOT 0.7 05/14/2011 1028          Assessment & Plan:   1. Cirrhosis   2. Chronic hepatitis C   3. Hepatic encephalopathy   4. GERD (gastroesophageal reflux disease)   5. Esophageal dysphagia    Her cirrhosis from chronic hepatitis C and prior alcohol appears stable as best I can tell. Will reassess with labs including CBC, metabolic panel, alpha-fetoprotein to screen for liver cancer, and a ProTime INR. She will follow up with Dr. Julieta Gutting in March. I will perform an upper endoscopy to screen for varices, she has not had these in the past though it looked like  she's had portal gastropathy. She does not appear to be volume overloaded. Dr. Julieta Gutting has been performing MRI of the liver in [facility], and I will defer to him for further imaging of  the abdomen and liver.  Her hepatic encephalopathy seems reasonably controlled to me at this time. She may have some mild manifestations that could be uncovered more formal testing, we'll defer to hepatology if needed. I'm going to change her back as Xifaxan since that to say for drug and neomycin, she may be a what her reduce lactulose over time as well. I did not think an ammonia level would change anything so I did not order 1 today.  She has GERD and dysphagia. This is been chronic and recurrent. She's had a relatively patent ring in the past. I will discontinue Pepcid and start omeprazole 20 mg daily. Upper endoscopy will assess this as well, she could required dilation depending upon what is seen, versus disruption of the ring. She may respond to PPI alone with respect to dysphagia.  We have discussed colorectal cancer screening with colonoscopy. I have elected to defer that at this time given her chronic liver disease. As long as her followup with Dr. Julieta Gutting is okay and there were no concerning problems related to the liver with respect to cancer screening, I think it could be reasonable to proceed with a colonoscopy later this year.

## 2011-10-07 LAB — COMPREHENSIVE METABOLIC PANEL
AST: 67 U/L — ABNORMAL HIGH (ref 0–37)
Albumin: 3 g/dL — ABNORMAL LOW (ref 3.5–5.2)
Alkaline Phosphatase: 129 U/L — ABNORMAL HIGH (ref 39–117)
BUN: 15 mg/dL (ref 6–23)
Potassium: 4.9 mEq/L (ref 3.5–5.1)
Sodium: 138 mEq/L (ref 135–145)

## 2011-10-14 ENCOUNTER — Ambulatory Visit (AMBULATORY_SURGERY_CENTER): Payer: Medicaid Other | Admitting: Internal Medicine

## 2011-10-14 ENCOUNTER — Encounter: Payer: Self-pay | Admitting: Internal Medicine

## 2011-10-14 VITALS — BP 128/75 | HR 69 | Temp 96.7°F | Resp 14 | Ht 62.0 in | Wt 198.0 lb

## 2011-10-14 DIAGNOSIS — K219 Gastro-esophageal reflux disease without esophagitis: Secondary | ICD-10-CM

## 2011-10-14 DIAGNOSIS — K7460 Unspecified cirrhosis of liver: Secondary | ICD-10-CM

## 2011-10-14 DIAGNOSIS — R1314 Dysphagia, pharyngoesophageal phase: Secondary | ICD-10-CM

## 2011-10-14 DIAGNOSIS — K449 Diaphragmatic hernia without obstruction or gangrene: Secondary | ICD-10-CM

## 2011-10-14 HISTORY — PX: UPPER GASTROINTESTINAL ENDOSCOPY: SHX188

## 2011-10-14 MED ORDER — SODIUM CHLORIDE 0.9 % IV SOLN: 500.0000 mL | INTRAVENOUS | Status: DC

## 2011-10-14 NOTE — Progress Notes (Signed)
Patient did not experience any of the following events: a burn prior to discharge; a fall within the facility; wrong site/side/patient/procedure/implant event; or a hospital transfer or hospital admission upon discharge from the facility. (G8907) Patient did not have preoperative order for IV antibiotic SSI prophylaxis. (G8918)  

## 2011-10-14 NOTE — Op Note (Signed)
[facility] [address] [place], [place]  [address]  ENDOSCOPY PROCEDURE REPORT  PATIENT:  Wendy, [name]  MR#:  [id number] BIRTHDATE:  1961/08/22, 50 yrs. old  GENDER:  female  ENDOSCOPIST:  Iva Boop, MD, [facility]  PROCEDURE DATE:  10/14/2011 PROCEDURE:  EGD with balloon dilatation ASA CLASS:  Class III INDICATIONS:  cirrhosis screen for varices, dysphagia  MEDICATIONS:   These medications were titrated to patient response per physician's verbal order, Fentanyl 75 mcg IV, Versed 9 mg IV, Benadryl 25 mg IV TOPICAL ANESTHETIC:  Cetacaine Spray  DESCRIPTION OF PROCEDURE:   After the risks benefits and alternatives of the procedure were thoroughly explained, informed consent was obtained.  The LB [id number] endoscope was introduced through the mouth and advanced to the second portion of the duodenum, without limitations.  The instrument was slowly withdrawn as the mucosa was fully examined. <<PROCEDUREIMAGES>>  A stricture was found in the distal esophagus. Ring-like stricture dilated to 18 mm with 15-16.6-18 mm balloon. No heme seen. Tortuous in the total esophagus. Looks like dysmotility.  A hiatal hernia was found. 5 cm - 35-40 cm.  Abnormal appearing mucosa in the total stomach. Erythema and mosaic mucosal pattern suggestive of portal gastropathy vs. gastritis.  Otherwise the examination was normal.    Retroflexed views revealed a hiatal hernia.    The scope was then withdrawn from the patient and the procedure completed.  COMPLICATIONS:  None  ENDOSCOPIC IMPRESSION: 1) Stricture in the distal esophagus - dilated to 18 mm 2) Tortuous in the total esophagus - suggestive of dysmotility - spasm seen 3) Hiatal hernia - 5 cm 4) Abnormal mucosa in the total stomach - portal gastropathy suspected 5) Otherwise normal examination RECOMMENDATIONS: 1) post-dilation diet today 2) start omeprazole 3) consider repeat EGD in 2-3 years (per Dr. Julieta Gutting - to  screen for varices)  Iva Boop, MD, Clement[facility] Graham  CCJason Coop MD and The Patient  n. eSIGNED:   Iva Boop at 10/14/2011 03:37 PM  Dan Maker, [id number]

## 2011-10-14 NOTE — Patient Instructions (Addendum)
The esophagus was dilated today. There was a narrow area where it joins the stomach and it was also spastic. Please start your omeprazole. Iva Boop, MD, FACG  YOU HAD AN ENDOSCOPIC PROCEDURE TODAY AT THE [facility]: Refer to the procedure report that was given to you for any specific questions about what was found during the examination.  If the procedure report does not answer your questions, please call your gastroenterologist to clarify.  If you requested that your care partner not be given the details of your procedure findings, then the procedure report has been included in a sealed envelope for you to review at your convenience later.  YOU SHOULD EXPECT: Some feelings of bloating in the abdomen. Passage of more gas than usual.  Walking can help get rid of the air that was put into your GI tract during the procedure and reduce the bloating. If you had a lower endoscopy (such as a colonoscopy or flexible sigmoidoscopy) you may notice spotting of blood in your stool or on the toilet paper. If you underwent a bowel prep for your procedure, then you may not have a normal bowel movement for a few days.   Drink plenty FLUIDS Move slowly & carefully for the rest of the day.  You can resume normal activity the day after the proc: Mon Friday, call [phone].  After hours and on weekends, please call the GI answering service at [phone] who will take a message and have the physician on call contact you. :        Following upper endoscopy (EGD)  Vomiting of blood or coffee ground material  New chest pain or pain under the shoulder blades  Painful or persistently difficult swallowing  New shortness of breath  Fever of 100F or higher  Black, tarry-looking stools  FOLLOW UP: If any biopsies were taken you will be contacted by phone or by letter within the next 1-3 weeks.  Call your gastroenterologist if you have not heard about the biopsies in 3 weeks.  Our staff will  call the home number listed on your records the next business day following your procedure to check on you and address any questions or concerns that you may have at that time regarding the information given to you following your procedure. This is a courtesy call and so if there is no answer at the home number and we have not heard from you through the emergency physician on call, we will assume that you have returned to your regular daily activities without incident.  SIGNATURES/CONFIDENTIALITY: You and/or your care partner have signed paperwork which will be entered into your electronic medical record.  These signatures attest to the fact that that the information above on your After Visit Summary has been reviewed and is understood.  Full responsibility of the confidentiality of this discharge information lies with you and/or your care-partner.    FOLLOW POST DILATATION DIET TODAY ( GIVEN TO YOU) LIQUIDS ONLY UNTIL 4: 30 PM THEN SOFT FOODS REST OF TODAY .

## 2011-10-15 ENCOUNTER — Telehealth: Payer: Self-pay | Admitting: *Deleted

## 2011-10-15 NOTE — Telephone Encounter (Signed)
  Follow up Call-  Call back number 10/14/2011  Post procedure Call Back phone  # [phone]  Permission to leave phone message Yes     Patient questions:  Phone machine picked up, but then hung up on me immediately.  No message left.

## 2011-10-16 ENCOUNTER — Telehealth: Payer: Self-pay | Admitting: *Deleted

## 2011-10-16 DIAGNOSIS — F419 Anxiety disorder, unspecified: Secondary | ICD-10-CM

## 2011-10-16 MED ORDER — CLONAZEPAM 1 MG PO TABS: 1.0000 mg | ORAL_TABLET | Freq: Three times a day (TID) | ORAL | Status: DC | PRN

## 2011-10-16 NOTE — Telephone Encounter (Signed)
[facility] calling.  Patient's refill request for Klonopin was denied.  Patient has made an appt for 10/28/11 and wants to know if she can have enough med refilled until her appt.  Will route note to Dr. Denyse Amass and call pharmacy back.  Gaylene Brooks, RN

## 2011-10-16 NOTE — Telephone Encounter (Signed)
Called in Rx for 36 tablets which should be enough until March 6th.

## 2011-10-20 NOTE — Telephone Encounter (Signed)
Returned call to patient and informed Rx refilled to her pharmacy.  Gaylene Brooks, RN

## 2011-10-28 ENCOUNTER — Encounter: Payer: Self-pay | Admitting: Family Medicine

## 2011-10-28 ENCOUNTER — Ambulatory Visit (INDEPENDENT_AMBULATORY_CARE_PROVIDER_SITE_OTHER): Payer: Medicaid Other | Admitting: Family Medicine

## 2011-10-28 VITALS — BP 138/88 | HR 76 | Ht 62.0 in | Wt 197.0 lb

## 2011-10-28 DIAGNOSIS — B372 Candidiasis of skin and nail: Secondary | ICD-10-CM

## 2011-10-28 DIAGNOSIS — F419 Anxiety disorder, unspecified: Secondary | ICD-10-CM

## 2011-10-28 DIAGNOSIS — F4001 Agoraphobia with panic disorder: Secondary | ICD-10-CM

## 2011-10-28 DIAGNOSIS — F411 Generalized anxiety disorder: Secondary | ICD-10-CM

## 2011-10-28 DIAGNOSIS — M549 Dorsalgia, unspecified: Secondary | ICD-10-CM

## 2011-10-28 MED ORDER — CLONAZEPAM 1 MG PO TABS: 1.0000 mg | ORAL_TABLET | Freq: Three times a day (TID) | ORAL | Status: DC | PRN

## 2011-10-28 MED ORDER — OXYCODONE HCL 5 MG PO TABS: 5.0000 mg | ORAL_TABLET | ORAL | Status: DC | PRN

## 2011-10-28 MED ORDER — MICONAZOLE NITRATE 2 % EX CREA: TOPICAL_CREAM | Freq: Two times a day (BID) | CUTANEOUS | Status: DC

## 2011-10-28 NOTE — Assessment & Plan Note (Signed)
Based on appearance.  Will treat with miconazole cream as nystatin powder not very effective currently.  Return in one month or sooner if needed.

## 2011-10-28 NOTE — Assessment & Plan Note (Signed)
Anxiety treated with clonazepam. Ideally I would much with her switch to SSRI therapy. Initially she was using this medicine very infrequently however she has increased recently. Plan to refill for one month. If continuing to use it 3 tablets a day will initiate benzodiazepine wean and initiate SSRI therapy.

## 2011-10-28 NOTE — Progress Notes (Signed)
Patient ID: Wendy [name], female   DOB: 1961/06/26, 51 y.o.   MRN: [id number] Wendy [name] is a 51 y.o. female who presents to [facility] today for   1) followup back and hip pain. In the past has been treated with oxycodone without Tylenol which did not work well. However one of the medicines that was prescribed by the [facility] hepatologist nalaxone which was used to prevent itching. This would explain why her pain was not well controlled.  Robaxin and also did not help the pain.  She has her first appointment with the pain management doctors April 15.  2) anxiety: Treated with clonazepam 1 mg up to 3 times a day. We've discussed this in the past that slowly benzodiazepines is not an ideal regimen for anxiety and however she was using them very infrequently.  She has run out of them.   She feels well without any hallucinations or delusions confusion or falls.    3) rash in groin:  Off and on for years. Previously has been treated with nystatin powder however this is not working currently.  No fevers or chills  PMH reviewed. Significant for cirrhosis and liver failure ROS as above otherwise neg Medications reviewed. Current Outpatient Prescriptions  Medication Sig Dispense Refill  . calcium-vitamin D (OSCAL-500) 500-400 MG-UNIT per tablet 2 tablets 2 (two) times daily.       . carvedilol (COREG) 6.25 MG tablet Take 1 tablet (6.25 mg total) by mouth 2 (two) times daily with a meal.  60 tablet  6  . citalopram (CELEXA) 40 MG tablet TAKE 1 TABLET EVERY DAY  30 tablet  3  . clonazePAM (KLONOPIN) 1 MG tablet Take 1 tablet (1 mg total) by mouth 3 (three) times daily as needed.  90 tablet  0  . furosemide (LASIX) 20 MG tablet Take 20 mg by mouth daily.        Marland Kitchen lactulose (CHRONULAC) 10 GM/15ML solution Take 20 g by mouth 3 (three) times daily. Cannot remember dose managed by gastroenterology       . omeprazole (PRILOSEC) 20 MG capsule Take 1 capsule (20 mg total) by mouth daily. Take 30-60 minutes before  breakfast  30 capsule  11  . Prenatal Vit-Fe Fumarate-FA (PRENATAL FORMULA) 28-0.8 MG TABS Take 1 tablet by mouth daily.        . rifaximin (XIFAXAN) 550 MG TABS Take 1 tablet (550 mg total) by mouth 2 (two) times daily.  60 tablet  11  . spironolactone (ALDACTONE) 25 MG tablet Take 1 tablet (25 mg total) by mouth daily.  30 tablet  3  . DISCONTD: clonazePAM (KLONOPIN) 1 MG tablet Take 1 tablet (1 mg total) by mouth 3 (three) times daily as needed.  36 tablet  0  . miconazole (MICATIN) 2 % cream Apply topically 2 (two) times daily.  198 g  1  . oxyCODONE (ROXICODONE) 5 MG immediate release tablet Take 1 tablet (5 mg total) by mouth every 4 (four) hours as needed for pain.  30 tablet  0    Exam:  BP 138/88  Pulse 76  Ht 5\' 2"  (1.575 m)  Wt 197 lb (89.359 kg)  BMI 36.03 kg/m2 Gen: Well NAD, obese HEENT: EOMI,  MMM Lungs: CTABL Nl WOB Heart: RRR no MRG Abd: NABS, NT, mildly distended Exts: Non edematous BL  LE, warm and well perfused.  Skin: Beefy red intertriginous areas in the groin bilateral.

## 2011-10-28 NOTE — Patient Instructions (Signed)
Thank you for coming in today. Please see me in 1 month.  We will re-evaluate your pain and your anxiety then.  If you have to use 90 pills of klonopin in 1 month that is too much.  Lets see how your pain goes.  Follow up with the liver doctor.  The pain clinic should take over your pain needs.

## 2011-10-28 NOTE — Assessment & Plan Note (Signed)
Chronic back pain, patient unable to take Tylenol or NSAIDs at high doses.  Has an appointment with pain management April 15.  Will prescribe oxycodone without Tylenol and followup in one month.

## 2011-10-29 ENCOUNTER — Telehealth: Payer: Self-pay | Admitting: Family Medicine

## 2011-10-29 NOTE — Telephone Encounter (Signed)
Will fwd. To Dr.Corey .Asir Bingley  

## 2011-10-29 NOTE — Telephone Encounter (Signed)
Patient is calling to let Dr. Denyse Amass know the name of some medications that she is on Omeprazole DR 20 mg  1 tab twice a day, Boniva these need to be added to her med list.  Also, the Micatin cream is not covered so she needs something that is covered.  The pain medication is not working.  She is going to try them through the weekend and see if they work.  She will call back on Monday about the pain meds.

## 2011-10-29 NOTE — Telephone Encounter (Signed)
Called patient back about the miconazole cream. It is available over the counter. She may use that cream or any athlete's foot cream. Will follow in 1 month.

## 2011-11-05 ENCOUNTER — Telehealth: Payer: Self-pay | Admitting: Family Medicine

## 2011-11-05 NOTE — Telephone Encounter (Signed)
Called pt and informed, that Dr.Corey will call her tomorrow. Wendy [name], Renato Battles

## 2011-11-05 NOTE — Telephone Encounter (Signed)
Pt is asking to speak with Dr Denyse Amass about her visit to doctor in [place] today

## 2011-11-09 ENCOUNTER — Telehealth: Payer: Self-pay

## 2011-11-09 NOTE — Telephone Encounter (Signed)
Message copied by Annett Fabian on Mon Nov 09, 2011  3:28 PM ------      Message from: Iva Boop      Created: Sun Nov 08, 2011 11:21 AM      Regarding: testing needs       I have reviewed Dr. Luisa Dago note            1) Schedule ultrasound of liver re: cirrhosis - he wants done every 6 months      2) schedule a screening colonoscopy - I would like her to do with propofol given cirrhosis, anxiety meds            Have copied her pcp

## 2011-11-09 NOTE — Telephone Encounter (Signed)
Left message for patient to call back  

## 2011-11-10 NOTE — Telephone Encounter (Signed)
Called Ms Samad back. Left a message.

## 2011-11-10 NOTE — Telephone Encounter (Signed)
Patient advised.  She is scheduled for 11/17/11

## 2011-11-10 NOTE — Telephone Encounter (Signed)
Wendy [name] called back to ask that she get a call back today because she doesn't want to wait until her appt in April before to discuss her visit to [facility].

## 2011-11-10 NOTE — Telephone Encounter (Signed)
Patient returned Dr. Truitt Leep call.

## 2011-11-10 NOTE — Telephone Encounter (Signed)
Patient was upset by the visit with Dr Brunilda Payor.  He told her that she will be in total liver failure in 2 years.  He asked her how her faith was and to get her stuff in order.  To her he seemed very ill prepared for the appt and didn't have her records.  He did find them while she was there.  She wants to wait to schedule any appts until she can speak with Dr Leone Payor.  She would like you to call her.  Do you want to call or have me schedule an office visit?

## 2011-11-10 NOTE — Telephone Encounter (Signed)
Office visit

## 2011-11-11 ENCOUNTER — Telehealth: Payer: Self-pay | Admitting: Family Medicine

## 2011-11-11 NOTE — Telephone Encounter (Signed)
Will forward to Dr Corey 

## 2011-11-11 NOTE — Telephone Encounter (Signed)
Patient is calling because she said she was told by Dr. Denyse Amass that [facility] would [facility] [address], but it isn't there yet.

## 2011-11-11 NOTE — Telephone Encounter (Signed)
Had long discussion with Wendy [name] about her recent visit to her hepatologist  Regarding her liver failure. He said that she will die in a year or two unless she takes better care of herself. Specifically 1) Low Salt 2) Off Klonipin 3) Off opiates.  She is feeling shocked about her visit and motivitated to change. She will start tapering klonipin and follow up with me on April 5th.

## 2011-11-14 MED ORDER — TRAMADOL HCL 50 MG PO TABS: 50.0000 mg | ORAL_TABLET | Freq: Three times a day (TID) | ORAL | Status: AC | PRN

## 2011-11-14 NOTE — Telephone Encounter (Signed)
Rx sent in.  Please let pt know.

## 2011-11-16 ENCOUNTER — Encounter: Payer: Self-pay | Admitting: *Deleted

## 2011-11-17 ENCOUNTER — Ambulatory Visit: Payer: Medicaid Other | Admitting: Internal Medicine

## 2011-11-23 ENCOUNTER — Ambulatory Visit: Payer: Self-pay | Admitting: Physical Medicine & Rehabilitation

## 2011-11-27 ENCOUNTER — Telehealth: Payer: Self-pay | Admitting: Family Medicine

## 2011-11-27 ENCOUNTER — Encounter: Payer: Self-pay | Admitting: Family Medicine

## 2011-11-27 ENCOUNTER — Ambulatory Visit (INDEPENDENT_AMBULATORY_CARE_PROVIDER_SITE_OTHER): Payer: Medicaid Other | Admitting: Family Medicine

## 2011-11-27 VITALS — BP 126/86 | HR 65 | Ht 62.0 in | Wt 193.0 lb

## 2011-11-27 DIAGNOSIS — L918 Other hypertrophic disorders of the skin: Secondary | ICD-10-CM

## 2011-11-27 DIAGNOSIS — L909 Atrophic disorder of skin, unspecified: Secondary | ICD-10-CM

## 2011-11-27 DIAGNOSIS — K7460 Unspecified cirrhosis of liver: Secondary | ICD-10-CM

## 2011-11-27 DIAGNOSIS — F4001 Agoraphobia with panic disorder: Secondary | ICD-10-CM

## 2011-11-27 MED ORDER — SERTRALINE HCL 50 MG PO TABS: 50.0000 mg | ORAL_TABLET | Freq: Every day | ORAL | Status: DC

## 2011-11-27 NOTE — Patient Instructions (Signed)
Thank you for coming in today. Please continue to taper off the klonipin.  Keep a daily log with how bad your anxiety is and how much klonipin you are taking.  We will start zoloft.  Take 1/2 pill of celexa daily for 3 days. Then stop. The same day start 1/2 pill of Zoloft. (Sertraline) Take 1/2 pill of zoloft daily for 3 days then increase to a full pill.  See me in 1 month.  Follow up with Dr. Gerilyn Pilgrim (nutrition). Make an appointment at the front.

## 2011-11-27 NOTE — Telephone Encounter (Signed)
Pt stated that she forgot to tell Dr. Denyse Amass that she was taking melatonin, and a "little yellow pill" she stated that the information for this can be looked up @ little yellow [employer]. Milk thistle with dandelion. Laureen Ochs, Viann Shove

## 2011-11-29 ENCOUNTER — Encounter: Payer: Self-pay | Admitting: Family Medicine

## 2011-11-29 DIAGNOSIS — L918 Other hypertrophic disorders of the skin: Secondary | ICD-10-CM | POA: Insufficient documentation

## 2011-11-29 NOTE — Assessment & Plan Note (Signed)
Anxiety currently treated with celexa and klonopin. Will switch to Zoloft with 6 day 1/2 dose crossover. Continue to wean klonopin. F/u in 1 month.

## 2011-11-29 NOTE — Progress Notes (Signed)
Wendy [name] is a 51 y.o. female who presents to [facility] today for   1) Discuss recent visit with hepatologist at [facility]. Was recently seen by Dr. Brunilda Payor [facility] see scanned notes). He discussed that her current period of good health will likely end in 2 years or so and she should be getting prepared for the possibility for a liver transplant. Specifically she was asked to Stop klonopin. She is anxious about this news but very motivated to be healthier to prepare for liver failure in the future.   2) Anxiety: Has weaned Klonopin to 0-2 tabs per day. Is now taking 1/2 or 1/4 tabs at a time. Remains mildly anxious. Also on Celexa. Feels well otherwise. No SIHI.   3) Bumps on external genitalia: Present for months. No discharge, abd pain, or pain in genitals.   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  ROS as above otherwise neg. No Chest pain, palpitations, SOB, Fever, Chills, Abd pain, N/V/D.  Medications reviewed. Current Outpatient Prescriptions  Medication Sig Dispense Refill  . calcium-vitamin D (OSCAL-500) 500-400 MG-UNIT per tablet 2 tablets 2 (two) times daily.       . carvedilol (COREG) 6.25 MG tablet Take 1 tablet (6.25 mg total) by mouth 2 (two) times daily with a meal.  60 tablet  6  . citalopram (CELEXA) 40 MG tablet TAKE 1 TABLET EVERY DAY  30 tablet  3  . clonazePAM (KLONOPIN) 1 MG tablet Take 1 tablet (1 mg total) by mouth 3 (three) times daily as needed.  90 tablet  0  . furosemide (LASIX) 20 MG tablet Take 20 mg by mouth daily.        Marland Kitchen ibandronate (BONIVA) 150 MG tablet Take 150 mg by mouth every 30 (thirty) days. Take in the morning with a full glass of water, on an empty stomach, and do not take anything else by mouth or lie down for the next 30 min.      . lactulose (CHRONULAC) 10 GM/15ML solution Take 20 g by mouth 3 (three) times daily. Cannot remember dose managed by gastroenterology       . miconazole (MICATIN) 2 % cream Apply topically 2 (two) times daily.   198 g  1  . omeprazole (PRILOSEC) 20 MG capsule Take 1 capsule (20 mg total) by mouth daily. Take 30-60 minutes before breakfast  30 capsule  11  . Prenatal Vit-Fe Fumarate-FA (PRENATAL FORMULA) 28-0.8 MG TABS Take 1 tablet by mouth daily.        . rifaximin (XIFAXAN) 550 MG TABS Take 1 tablet (550 mg total) by mouth 2 (two) times daily.  60 tablet  11  . sertraline (ZOLOFT) 50 MG tablet Take 1 tablet (50 mg total) by mouth daily.  30 tablet  3  . spironolactone (ALDACTONE) 25 MG tablet Take 1 tablet (25 mg total) by mouth daily.  30 tablet  3  . oxyCODONE (ROXICODONE) 5 MG immediate release tablet Take 1 tablet (5 mg total) by mouth every 4 (four) hours as needed for pain.  30 tablet  0    Exam:  BP 126/86  Pulse 65  Ht 5\' 2"  (1.575 m)  Wt 193 lb (87.544 kg)  BMI 35.30 kg/m2 Gen: Well NAD HEENT: EOMI,  MMM Lungs: CTABL Nl WOB Heart: RRR no MRG Abd: NABS, NT, ND Exts: Non edematous BL  LE, warm and well perfused.  Genitals: Small skin tab on BL labia. Otherwise normal.  Psych: Not anxious appearing. Normal  speech affect and though process.   No results found for this or any previous visit (from the past 72 hour(s)).

## 2011-11-29 NOTE — Assessment & Plan Note (Signed)
Currently well with MELD score <8. Will likely need transplant in the future. Plan to wean off Benzos and adhere to 2000mg  salt diet and <1800 calorie diet. Will refer to nutrition for help with this in the near future. F/u in 1 month.

## 2011-11-29 NOTE — Assessment & Plan Note (Signed)
Near the vagina. Reassured pt. F/u PRN.

## 2011-11-30 ENCOUNTER — Telehealth: Payer: Self-pay | Admitting: Family Medicine

## 2011-11-30 NOTE — Telephone Encounter (Signed)
Need to have this medicine refilled.  Dr. Denyse Amass is out of office until 4/15.  Refill request from pharmacy placed in your box.  Please fill for patient. Out of medicine altogether.

## 2011-12-01 ENCOUNTER — Ambulatory Visit (HOSPITAL_BASED_OUTPATIENT_CLINIC_OR_DEPARTMENT_OTHER): Payer: Medicaid Other | Admitting: Physical Medicine & Rehabilitation

## 2011-12-01 ENCOUNTER — Other Ambulatory Visit: Payer: Self-pay | Admitting: Sports Medicine

## 2011-12-01 ENCOUNTER — Encounter: Payer: Self-pay | Admitting: Physical Medicine & Rehabilitation

## 2011-12-01 ENCOUNTER — Encounter: Payer: Medicaid Other | Attending: Physical Medicine & Rehabilitation

## 2011-12-01 VITALS — BP 115/72 | HR 71 | Resp 18 | Ht 62.0 in | Wt 188.0 lb

## 2011-12-01 DIAGNOSIS — M7061 Trochanteric bursitis, right hip: Secondary | ICD-10-CM

## 2011-12-01 DIAGNOSIS — M545 Low back pain, unspecified: Secondary | ICD-10-CM | POA: Insufficient documentation

## 2011-12-01 DIAGNOSIS — M76899 Other specified enthesopathies of unspecified lower limb, excluding foot: Secondary | ICD-10-CM

## 2011-12-01 DIAGNOSIS — M79609 Pain in unspecified limb: Secondary | ICD-10-CM | POA: Insufficient documentation

## 2011-12-01 DIAGNOSIS — M25551 Pain in right hip: Secondary | ICD-10-CM | POA: Insufficient documentation

## 2011-12-01 MED ORDER — METHOCARBAMOL 500 MG PO TABS: 500.0000 mg | ORAL_TABLET | Freq: Three times a day (TID) | ORAL | Status: DC | PRN

## 2011-12-01 NOTE — Progress Notes (Signed)
Subjective:    Patient ID: Wendy [name], female    DOB: 1960-09-18, 51 y.o.   MRN: [id number]  HPI    2 year history of low back pain that does not go into the legs. Was told by primary care physician that she has arthritis and spinal stenosis. No x-rays in the [facility] system. Also has right-sided groin pain. Has had hemiarthroplasty following a right hip fracture in 2009. Has tried many different medications for her pain including oxycodone, tramadol, hydrocodone , none of which have had a positive effect. Has not tried injections for her back yet. Pain Inventory Average Pain 8 Pain Right Now 10 My pain is sharp, burning and aching  In the last 24 hours, has pain interfered with the following? General activity 9 Relation with others 10 Enjoyment of life 10 What TIME of day is your pain at its worst? All Day Sleep (in general) Fair  Pain is worse with: walking, bending, sitting, inactivity and standing Pain improves with: rest and medication Relief from Meds: 4  Mobility walk with assistance use a cane ability to climb steps?  no do you drive?  no Do you have any goals in this area?  no  Function disabled: date disabled  I need assistance with the following:  dressing, bathing, meal prep, household duties and shopping  Neuro/Psych bowel control problems weakness trouble walking spasms dizziness confusion depression anxiety  Prior Studies Any changes since last visit?  no  Physicians involved in your care Any changes since last visit?  no  Review of Systems  HENT: Negative.   Eyes: Negative.   Respiratory: Negative.   Cardiovascular: Negative.   Gastrointestinal: Negative.   Genitourinary: Negative.   Musculoskeletal: Negative.   Skin: Negative.   Neurological: Positive for dizziness and weakness.  Hematological: Negative.   Psychiatric/Behavioral: Negative.        Objective:   Physical Exam  Constitutional: She is oriented to person, place,  and time. She appears well-developed.  Musculoskeletal:       Right hip: She exhibits tenderness.       Lumbar back: She exhibits decreased range of motion. She exhibits no tenderness, no swelling, no deformity and no spasm.        Right hip tenderness over trochanteric bursa  Neurological: She is alert and oriented to person, place, and time. She has normal strength and normal reflexes. No sensory deficit.  Skin: Skin is warm and dry.  Psychiatric: She has a normal mood and affect. Her behavior is normal. Judgment and thought content normal.       Assessment & Plan:   1. Lumbar axial back pain which is mainly in the L4-L5 area. She has her best position of comfort leaning forward. This suggests lumbar facet pain.Will order x-rays and scheduled for lumbar medial branch blocks  Patient feels she has not had good relief with opioids. I really don't think another problem opioid would be helpful in this situation. We discussed other nonnarcotic options.  2. Right trochanteric bursitis Will inject today. Stretching program with physical therapy   right trochanteric bursa injection   informed consent was obtained after describing risks and benefits of the procedure the patient she  Would like to proceed with this.   patient placed in the left lateral decubitus position. Area marked and prepped with Betadine. A  21-gauge 2 inch needle was inserted to bone contact. 1 cc of  Depo-Medrol 40 mg per cc and 4 cc of 1% lidocaine  were injected. Patient tolerated procedure well.  Post procedure instructions given.

## 2011-12-01 NOTE — Telephone Encounter (Signed)
meds have been refilled.Wendy [name] [place]

## 2011-12-01 NOTE — Patient Instructions (Signed)
Facet Block A facet block is an injection procedure used to numb nerves near a spinal joint (facet). The injection usually includes a medicine like Novacaine (anesthetic) and a steroid medicine (similar to cortisone). The injections are made directly into the facet joint of the back. They are used for patients with several types of neck or back pain problems (such as worsening arthritis or persistent pain after surgery) that have not been helped with anti-inflammatory medications, exercise programs, physical therapy, and other forms of pain management. Multiple injections may be needed depending on how many joints are involved.  A facet block procedure can be helpful with diagnosis as well as providing therapeutic pain relief. One of three things may happen after the procedure:  The pain does not go away. This can mean that the pain is probably not coming from blocked facet joints. This information is helpful with diagnosis.   The pain goes away and stays away for a few hours but the original pain comes back and does not get better again. This information is also helpful with diagnosis. It can mean that pain is probably coming from the joints; but the steroid was not helpful for longer term pain control.   The pain goes away after the block, then returns later that day, and then gets better again over the next few days. This can mean that the block was helpful for pain control and the steroid had a longer lasting effect.  If there is good, lasting benefit from the injections, the block may be repeated from 3 to 5 times. If there is good relief but it is only of short-term benefit, other procedures (such as radiofrequency lesioning) may be considered.  Note: The procedure cannot be performed if you have an active infection, a lesion on or near the area of injection, flu, cold, fever, very high blood pressure or if you are on blood thinners. Please make your doctor aware of any of these conditions. This is  for your safety!  LET YOUR CAREGIVER KNOW ABOUT:   Allergies.   Medications taken including herbs, eye drops, over the counter medications, and creams   Use of steroids (by mouth or creams).   Possible pregnancy, if applicable.   Previous problems with anesthetics or Novocaine.   History of blood clots.   History of bleeding or blood problems.   Previous surgery, particularly of the neck and/or back   Other health problems.  RISKS AND COMPLICATIONS These are very uncommon but include:  Bleeding.   Injury to a nerve near the injection site.   Weakness or numbness in areas controlled by nerves near the injection site.   Infection.   Pain at the site of the injection.   Temporary fluid retention in those who are prone to this problem.   Allergic reaction to anesthetics or medicines used during the procedure.  Diabetics may have a temporary increase in their blood sugar after any surgical procedure, especially if steroids are used. Stinging/burning of the numbing medicine is the most uncomfortable part of the procedure; however every person's response to any procedure is individual.  BEFORE THE PROCEDURE   Your caregiver will provide instructions about stopping any medication before the procedure.   Unless advised otherwise, if the injections are in your neck, you may take your medications as usual with a sip of water but do not eat or drink for 6 hours before the procedure.   Unless advised otherwise, you may eat, drink and take your medications as   usual on the day of the procedure (both before and after) if the injections are to be in your lower back.   There is no other specific preparation necessary unless advised otherwise.  PROCEDURE After checking your blood pressure, the procedure will be done in the x-ray (fluoroscopy) room while lying on your stomach. For procedures in the neck, an intravenous line is usually started. The back is then cleansed with an antiseptic  soap. Sterile drapes are placed in this area. The skin is numbed with a local anesthetic. This is felt as a stinging or burning sensation. Using x-ray guidance, needles are then advanced to the appropriate locations. Once the needles are in the proper location, the anesthetic and steroid is injected through the needles and the needles are removed. The skin is then cleansed and bandages are applied. Blood pressure will be checked again, and you will be discharged to leave with your ride after your caregiver says it is okay to go.  AFTER THE PROCEDURE  You may not drive for the remainder of the day after your procedure. An adult must be present to drive you home or to go with you in a taxi or on public transportation. The procedure will be canceled if you do not have a responsible adult with you! This is for your safety.  HOME CARE INSTRUCTIONS   The bandages noted above can be removed on the morning after the procedure.   Resume medications according to your caregiver's instructions.   No heat is to be used near or over the injected area(s) for the remainder of the day.   No tub bath or soaking in water (such as a pool, jacuzzi, etc.) for the remainder of the day.   Some local tenderness may be experienced for a couple of days after the injection. Using an ice pack three or four times a day will help this.   Keep track of the amount of pain relief as well as how long the pain relief lasted.  SEEK MEDICAL CARE IF:   There is drainage from the injection site.   Pain is not controlled with medications prescribed.   There is significant bleeding or swelling.  SEEK IMMEDIATE MEDICAL CARE IF:   You develop a fever of 101 F (38.3 C) or greater.   Worsening pain, swelling, and/or red streaking develops in the skin around the injection site.   Severe pain develops and cannot be controlled with medications prescribed.   You develop any headache, stiff neck, nausea, vomiting, or your eyes become  very sensitive to light.   Weakness or paralysis develops in arms or legs not present before the procedure.   You develop difficulty urinating or difficulty breathing.  Document Released: 12/30/2006 Document Revised: 07/30/2011 Document Reviewed: 12/20/2008 Exit[employer] Patient Information 2012 [employer]. 

## 2011-12-08 ENCOUNTER — Ambulatory Visit (INDEPENDENT_AMBULATORY_CARE_PROVIDER_SITE_OTHER): Payer: Medicaid Other | Admitting: Family Medicine

## 2011-12-08 ENCOUNTER — Encounter: Payer: Self-pay | Admitting: Family Medicine

## 2011-12-08 VITALS — Ht 62.0 in | Wt 193.9 lb

## 2011-12-08 DIAGNOSIS — K7460 Unspecified cirrhosis of liver: Secondary | ICD-10-CM

## 2011-12-08 DIAGNOSIS — E669 Obesity, unspecified: Secondary | ICD-10-CM

## 2011-12-08 NOTE — Patient Instructions (Addendum)
-   Continue eating at least 3 meals and 1-2 snacks per day.    - Aim for no more than 5 hours between eating.  - If you want to lose weight, you want to limit the amount of sugar and fat in your diet.   - Breakfast:    - Include protein, i.e., eggs, yogurt, milk.  Include at least a full cup of milk with your cereal.    - Include a lot of fiber.  If you use cereal, choose one with at least 5 grams of fiber per serving.  Consider adding a high-fiber cereal to other cereals, i.e., All Bran or Bran Buds or Shredded Wheat 'n Bran. - Limit fat intake to 35 grams a day.   - This means reading labels for total fat.    - If you don't have a label, check out Calorieking.com for information on nutrient contents.    - Switch to 1% milk, then eventually to fat-free (skim) milk. - Taste preferences are learned:  You can learn to PREFER fat-free milk and WATER! - Google "Lafonda Mosses and sugar." - Google "Salt Sugar Fat" by Joan Flores.  - AT YOUR FOLLOW-UP APPT, WE'LL ADDRESS GETTING ADEQUATE PROTEIN, AND MORE FRUITS AND VEGETABLES.

## 2011-12-08 NOTE — Progress Notes (Signed)
Medical Nutrition Therapy:  Appt start time: 1000 end time:  1100.  Assessment:  Primary concerns today: cirrhosis.  Ms. Rosales has made many dietary changes including cutting back on salt and fat, but she has been unable to lose weight.  Usual eating pattern includes 3 meals and 3 snacks per day. Everyday foods include 48 oz diet decaf Dr Reino Kent, Special K cereal.  Avoided foods include high-Na & high-fat foods, most restaurant foods, and water.   Ms. Novinger seems very motivated to make recommended changes, but needs some guidance, i.e., she stopped eating Kashi GoLean cereal b/c a friend told her it was full of preservatives.  She would like to learn to like water, which she currently does not drink.    24-hr recall suggests an intake of ~1400 kcal: B (8 AM)-   1 1/2 c Raisin Bran Crunch, 1 c 2% milk, 1 banana, 6 oz orange juice   (470 kcal) Snk ( AM)-   none L (12 PM)-  1 c frzn veg's, 2 small wedges cantaloupe, diet Dr Reino Kent   (110 kcal) Snk (2 PM)-  1/2 c Lucendia Herrlich, Ovaltine in 1 c 2% milk, 1 tangerine   (330 kcal) D (8 PM)-  6" sub:  Chx, let, tom, onion, cheese, diet Dr Reino Kent   (320 kcal)  Snk (9 PM)-  1/2 c Lucendia Herrlich, Ovaltine in 1 c 2% milk    (170 kcal)  Usual physical activity includes none.  Progress Towards Goal(s):  In progress.   Nutritional Diagnosis:  NB-2.1 Physical inactivity As related to back pain and low energy.  As evidenced by no regular exercise of any kind. NI-5.8.5 Inadeqate fiber intake As related to breakfast, fruits, & veg's.  As evidenced by low-fiber cereal usually consumed, and fruits & veg's each only once a day.    Intervention:  Nutrition education.  Monitoring/Evaluation:  Dietary intake, exercise, and body weight in 1 month.

## 2011-12-10 ENCOUNTER — Encounter: Payer: Self-pay | Admitting: Internal Medicine

## 2011-12-10 ENCOUNTER — Ambulatory Visit (INDEPENDENT_AMBULATORY_CARE_PROVIDER_SITE_OTHER): Payer: Medicaid Other | Admitting: Internal Medicine

## 2011-12-10 VITALS — BP 132/74 | HR 64 | Ht 62.0 in | Wt 193.0 lb

## 2011-12-10 DIAGNOSIS — Z9283 Personal history of failed moderate sedation: Secondary | ICD-10-CM

## 2011-12-10 DIAGNOSIS — K7460 Unspecified cirrhosis of liver: Secondary | ICD-10-CM

## 2011-12-10 DIAGNOSIS — Z1211 Encounter for screening for malignant neoplasm of colon: Secondary | ICD-10-CM

## 2011-12-10 MED ORDER — PEG-KCL-NACL-NASULF-NA ASC-C 100 G PO SOLR: 1.0000 | Freq: Once | ORAL | Status: DC

## 2011-12-10 NOTE — Assessment & Plan Note (Signed)
Stable Nodules on MR at [facility] - for repeat MRI 04/2012

## 2011-12-10 NOTE — Patient Instructions (Signed)
You have been scheduled for a colonoscopy with propofol. Please follow written instructions given to you at your visit today.  Please pick up your prep kit at the pharmacy within the next 1-3 days.  

## 2011-12-10 NOTE — Progress Notes (Signed)
  Subjective:    Patient ID: Wendy [name], female    DOB: Jul 06, 1961, 51 y.o.   MRN: [id number]  HPI Patient returns for followup. She has hepatitis C and cirrhosis. She was recently seen at [facility]. She tells me that she had an MRI which demonstrated 2 nodules in the liver and she needs a followup MRI in September. Otherwise she has no active complaints at this time. We had to for screening colonoscopy until her visit to [facility]. Dr. Julieta Gutting has indicated that it is reasonable to pursue screening colonoscopy.  Medications, allergies, past medical history, past surgical history, family history and social history are reviewed and updated in the EMR.   Review of Systems As above    Objective:   Physical Exam Well-developed well-nourished, overweight or obese, no acute distress       Assessment & Plan:   1. Special screening for malignant neoplasms, colon   2. HEPATIC CIRRHOSIS WITH HCV AND HX OF ALCOHOL   3.  Personal history of failed moderate sedation  Overall she seems stable. Question whether she has regenerative nodules or something more sinister on her MRI, deferred to Dr. Julieta Gutting for followup of this.  Will proceed with a screening colonoscopy, propofol sedation we used due to previous intolerance to moderate sedation.The risks and benefits as well as alternatives of endoscopic procedure(s) have been discussed and reviewed. All questions answered. The patient agrees to proceed.

## 2011-12-14 ENCOUNTER — Telehealth: Payer: Self-pay | Admitting: Family Medicine

## 2011-12-14 NOTE — Telephone Encounter (Signed)
Fwd. To Dr.Corey. Please see messages. Thanks .Arlyss Repress

## 2011-12-14 NOTE — Telephone Encounter (Signed)
Patient is calling to let Dr. Denyse Amass know that her hair is coming out.  Not in huge clumps and has a bald spot.  She has checked with the pharmacy and they did not see any medications that could cause that.  She needs some direction for that.  Also, she is constipated more often than not, she also called the pharmacy about that and he said the Coreg or Tramadol could be causing that.  The Pharmacist said that Tramadol is most likely the cause.  Also, she had gone to the dentist and the dentist wanted her to take Vicodin or some kind of pain medication that she can take.  She also wants to know if she needs to be premedicated before going to the dentist.

## 2011-12-14 NOTE — Telephone Encounter (Signed)
Pt took Lodine for itching for her liver disease and allergies.  Wants to know if she can get this called in. [facility] [address] - [phone]

## 2011-12-14 NOTE — Telephone Encounter (Signed)
Patient is calling back because she is scheduled to do [employer].  She has missed the 10 day mark for getting out of it.  So, if she is to be picked, she would like a note saying that she is not able to do the [employer] do to all of her appt that she has and because of health in general.  Please call her when it is ready to be picked up.

## 2011-12-15 ENCOUNTER — Telehealth: Payer: Self-pay | Admitting: Family Medicine

## 2011-12-15 MED ORDER — AMOXICILLIN 500 MG PO CAPS: ORAL_CAPSULE | ORAL | Status: DC

## 2011-12-15 MED ORDER — POLYETHYLENE GLYCOL 3350 17 GM/SCOOP PO POWD: 17.0000 g | Freq: Every day | ORAL | Status: AC

## 2011-12-15 MED ORDER — LORATADINE 10 MG PO TABS: 10.0000 mg | ORAL_TABLET | Freq: Every day | ORAL | Status: DC

## 2011-12-15 NOTE — Telephone Encounter (Signed)
Will forward message to Dr. Corey 

## 2011-12-15 NOTE — Telephone Encounter (Signed)
Needs amox for dental work tomorrow - needs to be premeditated for this [phone]

## 2011-12-15 NOTE — Telephone Encounter (Signed)
Called back: 1) Hair loss: Not sure of cause. Could be poor health or alopecia aerata. Will need visit  2) Jury Duty: Will write letter.  3) Bowel Movement: Constipation. Decreased tramadol dose 4) Itching: Calling in claritin.  Will follow up this on the 29th.

## 2011-12-15 NOTE — Telephone Encounter (Signed)
Paged Dr. Denyse Amass and he verbally orders Amoxicillin 500 mg , 4 capsules at once 30-60 minutes prior to procedure.Marland Kitchen RX sent to pharmacy electronically.

## 2011-12-22 ENCOUNTER — Telehealth: Payer: Self-pay | Admitting: Physical Medicine & Rehabilitation

## 2011-12-22 NOTE — Telephone Encounter (Signed)
Wendy [name] was wanting to know if Dr Dion Saucier had talked to Dr Wynn Banker and was he going to do a procedure he wanted him to do at the same time as the back injection.  Dr Wynn Banker has not spoken with Dr Dion Saucier. I reviewed the upcoming MBB instructions and she said she would talk with Dr Wynn Banker on Thursday at her appt.

## 2011-12-22 NOTE — Telephone Encounter (Signed)
Please call concerning upcoming procedure

## 2011-12-24 ENCOUNTER — Ambulatory Visit (HOSPITAL_BASED_OUTPATIENT_CLINIC_OR_DEPARTMENT_OTHER): Payer: Medicaid Other | Admitting: Physical Medicine & Rehabilitation

## 2011-12-24 ENCOUNTER — Encounter: Payer: Medicare Other | Attending: Physical Medicine & Rehabilitation

## 2011-12-24 ENCOUNTER — Encounter: Payer: Self-pay | Admitting: Physical Medicine & Rehabilitation

## 2011-12-24 VITALS — BP 148/71 | HR 63 | Resp 16 | Ht 62.0 in | Wt 190.0 lb

## 2011-12-24 DIAGNOSIS — M545 Low back pain, unspecified: Secondary | ICD-10-CM | POA: Insufficient documentation

## 2011-12-24 DIAGNOSIS — M76899 Other specified enthesopathies of unspecified lower limb, excluding foot: Secondary | ICD-10-CM | POA: Diagnosis not present

## 2011-12-24 DIAGNOSIS — M47816 Spondylosis without myelopathy or radiculopathy, lumbar region: Secondary | ICD-10-CM

## 2011-12-24 DIAGNOSIS — M25559 Pain in unspecified hip: Secondary | ICD-10-CM

## 2011-12-24 DIAGNOSIS — M47817 Spondylosis without myelopathy or radiculopathy, lumbosacral region: Secondary | ICD-10-CM

## 2011-12-24 DIAGNOSIS — M79609 Pain in unspecified limb: Secondary | ICD-10-CM | POA: Insufficient documentation

## 2011-12-24 DIAGNOSIS — G8929 Other chronic pain: Secondary | ICD-10-CM

## 2011-12-24 NOTE — Progress Notes (Signed)
  PROCEDURE RECORD [facility]   Name: Wendy [name] DOB:05/19/1961 MRN: [id number]  Date:12/24/2011  Physician: Claudette Laws, MD    Nurse/CMA:Latana Colin RN  Allergies:  Allergies  Allergen Reactions  . Codeine Phosphate Itching    REACTION: unspecified    Consent Signed: yes  Is patient diabetic? no  CBG today?  Pregnant: no LMP: No LMP recorded. Patient is postmenopausal. (age [age])  Anticoagulants: no Anti-inflammatory: toradol po 2-3 days ago Antibiotics: yes (Amoxil for dental infection-has been on it over a week)  Procedure: Bilateral MBB L3-4-5 Position: Prone Start Time: 11:40 AM End Time: 11:49 Fluoro Time: 27 sec  RN/CMA Oleva Koo Rudi Bunyard    Time 10:29 11:54    BP 148/71 151/98    Pulse 63 68    Respirations 16 16    O2 Sat 98% 96%    S/S 6 6    Pain Level 8/10 6/10     D/C home with Ma Rings, patient A & O X 3, D/C instructions reviewed, and sits independently.

## 2011-12-24 NOTE — Progress Notes (Signed)
Bilateral Lumbar L3, L4  medial branch blocks and L 5 dorsal ramus injection under fluoroscopic guidance  Indication: Lumbar pain which is not relieved by medication management or other conservative care and interfering with self-care and mobility.  Informed consent was obtained after describing risks and benefits of the procedure with the patient, this includes bleeding, infection, paralysis and medication side effects.  The patient wishes to proceed and has given written consent.  The patient was placed in prone position.  The lumbar area was marked and prepped with Betadine.  One mL of 1% lidocaine was injected into each of 6 areas into the skin and subcutaneous tissue.  Then a 22-gauge 5in spinal needle was inserted targeting the junction of the left S1 superior articular process and sacral ala junction. Needle was advanced under fluoroscopic guidance.  Bone contact was made.  Omnipaque 180 was injected x 0.5 mL demonstrating no intravascular uptake.  Then a solution containing one mL of 4 mg per mL dexamethasone and 3 mL of 2% MPF lidocaine was injected x 0.5 mL.  Then the left L5 superior articular process in transverse process junction was targeted.  Bone contact was made.  Omnipaque 180 was injected x 0.5 mL demonstrating no intravascular uptake. Then a solution containing one mL of 4 mg per mL dexamethasone and 3 mL of 2% MPF lidocaine was injected x 0.5 mL.  Then the left L4 superior articular process in transverse process junction was targeted.  Bone contact was made.  Omnipaque 180 was injected x 0.5 mL demonstrating no intravascular uptake.  Then a solution containing one mL of 4 mg per mL dexamethasone and 3 mL if 2% MPF lidocaine was injected x 0.5 mL.  This same procedure was performed on the right side using the same needle, technique and injectate.  Patient tolerated procedure well.  Post procedure instructions were given. 

## 2011-12-24 NOTE — Patient Instructions (Addendum)
Facet Block A facet block is an injection procedure used to numb nerves near a spinal joint (facet). The injection usually includes a medicine like Novacaine (anesthetic) and a steroid medicine (similar to cortisone). The injections are made directly into the facet joint of the back. They are used for patients with several types of neck or back pain problems (such as worsening arthritis or persistent pain after surgery) that have not been helped with anti-inflammatory medications, exercise programs, physical therapy, and other forms of pain management. Multiple injections may be needed depending on how many joints are involved.  A facet block procedure can be helpful with diagnosis as well as providing therapeutic pain relief. One of three things may happen after the procedure:  The pain does not go away. This can mean that the pain is probably not coming from blocked facet joints. This information is helpful with diagnosis.   The pain goes away and stays away for a few hours but the original pain comes back and does not get better again. This information is also helpful with diagnosis. It can mean that pain is probably coming from the joints; but the steroid was not helpful for longer term pain control.   The pain goes away after the block, then returns later that day, and then gets better again over the next few days. This can mean that the block was helpful for pain control and the steroid had a longer lasting effect.  If there is good, lasting benefit from the injections, the block may be repeated from 3 to 5 times. If there is good relief but it is only of short-term benefit, other procedures (such as radiofrequency lesioning) may be considered.  Note: The procedure cannot be performed if you have an active infection, a lesion on or near the area of injection, flu, cold, fever, very high blood pressure or if you are on blood thinners. Please make your doctor aware of any of these conditions. This is  for your safety!  LET YOUR CAREGIVER KNOW ABOUT:   Allergies.   Medications taken including herbs, eye drops, over the counter medications, and creams   Use of steroids (by mouth or creams).   Possible pregnancy, if applicable.   Previous problems with anesthetics or Novocaine.   History of blood clots.   History of bleeding or blood problems.   Previous surgery, particularly of the neck and/or back   Other health problems.  RISKS AND COMPLICATIONS These are very uncommon but include:  Bleeding.   Injury to a nerve near the injection site.   Weakness or numbness in areas controlled by nerves near the injection site.   Infection.   Pain at the site of the injection.   Temporary fluid retention in those who are prone to this problem.   Allergic reaction to anesthetics or medicines used during the procedure.  Diabetics may have a temporary increase in their blood sugar after any surgical procedure, especially if steroids are used. Stinging/burning of the numbing medicine is the most uncomfortable part of the procedure; however every person's response to any procedure is individual.  BEFORE THE PROCEDURE   Your caregiver will provide instructions about stopping any medication before the procedure.   Unless advised otherwise, if the injections are in your neck, you may take your medications as usual with a sip of water but do not eat or drink for 6 hours before the procedure.   Unless advised otherwise, you may eat, drink and take your medications as   usual on the day of the procedure (both before and after) if the injections are to be in your lower back.   There is no other specific preparation necessary unless advised otherwise.  PROCEDURE After checking your blood pressure, the procedure will be done in the x-ray (fluoroscopy) room while lying on your stomach. For procedures in the neck, an intravenous line is usually started. The back is then cleansed with an antiseptic  soap. Sterile drapes are placed in this area. The skin is numbed with a local anesthetic. This is felt as a stinging or burning sensation. Using x-ray guidance, needles are then advanced to the appropriate locations. Once the needles are in the proper location, the anesthetic and steroid is injected through the needles and the needles are removed. The skin is then cleansed and bandages are applied. Blood pressure will be checked again, and you will be discharged to leave with your ride after your caregiver says it is okay to go.  AFTER THE PROCEDURE  You may not drive for the remainder of the day after your procedure. An adult must be present to drive you home or to go with you in a taxi or on public transportation. The procedure will be canceled if you do not have a responsible adult with you! This is for your safety.  HOME CARE INSTRUCTIONS   The bandages noted above can be removed on the morning after the procedure.   Resume medications according to your caregiver's instructions.   No heat is to be used near or over the injected area(s) for the remainder of the day.   No tub bath or soaking in water (such as a pool, jacuzzi, etc.) for the remainder of the day.   Some local tenderness may be experienced for a couple of days after the injection. Using an ice pack three or four times a day will help this.   Keep track of the amount of pain relief as well as how long the pain relief lasted.  SEEK MEDICAL CARE IF:   There is drainage from the injection site.   Pain is not controlled with medications prescribed.   There is significant bleeding or swelling.  SEEK IMMEDIATE MEDICAL CARE IF:   You develop a fever of 101 F (38.3 C) or greater.   Worsening pain, swelling, and/or red streaking develops in the skin around the injection site.   Severe pain develops and cannot be controlled with medications prescribed.   You develop any headache, stiff neck, nausea, vomiting, or your eyes become  very sensitive to light.   Weakness or paralysis develops in arms or legs not present before the procedure.   You develop difficulty urinating or difficulty breathing.  Document Released: 12/30/2006 Document Revised: 07/30/2011 Document Reviewed: 12/20/2008 [employer] Patient Information 2012 [employer]. 

## 2011-12-25 ENCOUNTER — Encounter: Payer: Self-pay | Admitting: Family Medicine

## 2011-12-25 ENCOUNTER — Ambulatory Visit (INDEPENDENT_AMBULATORY_CARE_PROVIDER_SITE_OTHER): Payer: Medicaid Other | Admitting: Family Medicine

## 2011-12-25 VITALS — BP 170/108 | HR 77 | Ht 62.0 in | Wt 190.0 lb

## 2011-12-25 DIAGNOSIS — F4001 Agoraphobia with panic disorder: Secondary | ICD-10-CM

## 2011-12-25 DIAGNOSIS — I10 Essential (primary) hypertension: Secondary | ICD-10-CM | POA: Diagnosis not present

## 2011-12-25 DIAGNOSIS — L659 Nonscarring hair loss, unspecified: Secondary | ICD-10-CM | POA: Diagnosis not present

## 2011-12-25 MED ORDER — CLONAZEPAM 0.25 MG PO TBDP: 0.2500 mg | ORAL_TABLET | Freq: Three times a day (TID) | ORAL | Status: DC | PRN

## 2011-12-25 MED ORDER — CARVEDILOL 12.5 MG PO TABS: 12.5000 mg | ORAL_TABLET | Freq: Two times a day (BID) | ORAL | Status: DC

## 2011-12-25 MED ORDER — SERTRALINE HCL 100 MG PO TABS: 100.0000 mg | ORAL_TABLET | Freq: Every day | ORAL | Status: DC

## 2011-12-25 NOTE — Patient Instructions (Signed)
Thank you for coming in today. 1) Blood pressure: Increase coreg to 12.5 mg twice a day.  2) Hair loss: Checking thyroid today.  3) Anxiety: Increasing zoloft to 100mg  daily.   Decrease klonipin to 0.25mg  tabs 1-2 every 8 hours as needed. Goal to use only 1 or 2 pills a day over the next month.  4) Return in about 1 month.  5) Go to water aerobics.  Call or go to the emergency room if you get worse, have trouble breathing, have chest pains, or palpitations.

## 2011-12-25 NOTE — Progress Notes (Signed)
Wendy [name] is a 51 y.o. female who presents to [facility] today for   1) hypertension: Currently using Coreg, Lasix, spironolactone.  Denies any chest pains or palpitations. Additionally denies any lower extremity edema.  2) anxiety: Has weaned herself down to half a Klonopin daily or every other day. Is currently taking Zoloft. Feels better. No SI/HI.  3) Hair: Patient has noted hair loss recently.  She's not sure what could be causing it. She feels well otherwise. No fevers chills dry skin feeling hot or cold  4) weight: Has been nutrition therapy. Is currently starting water aerobics.   PMH: Reviewed significant for cirrhosis secondary to hepatitis C and former alcohol use. History  Substance Use Topics  . Smoking status: Former Smoker    Quit date: 12/23/2008  . Smokeless tobacco: Never Used  . Alcohol Use: No     goes to AA   ROS as above  Medications reviewed. Current Outpatient Prescriptions  Medication Sig Dispense Refill  . alendronate (FOSAMAX) 70 MG tablet Take 70 mg by mouth every 7 (seven) days. Take with a full glass of water on an empty stomach.      . calcium-vitamin D (OSCAL-500) 500-400 MG-UNIT per tablet 2 tablets 2 (two) times daily.       . furosemide (LASIX) 20 MG tablet Take 20 mg by mouth daily.        Marland Kitchen lactulose (CHRONULAC) 10 GM/15ML solution Take 20 g by mouth 3 (three) times daily. Cannot remember dose managed by gastroenterology       . loratadine (CLARITIN) 10 MG tablet Take 1 tablet (10 mg total) by mouth daily.  30 tablet  11  . omeprazole (PRILOSEC) 20 MG capsule Take 1 capsule (20 mg total) by mouth daily. Take 30-60 minutes before breakfast  30 capsule  11  . Prenatal Vit-Fe Fumarate-FA (PRENATAL FORMULA) 28-0.8 MG TABS Take 1 tablet by mouth daily.        . rifaximin (XIFAXAN) 550 MG TABS Take 1 tablet (550 mg total) by mouth 2 (two) times daily.  60 tablet  11  . spironolactone (ALDACTONE) 25 MG tablet Take 1 tablet (25 mg total) by mouth daily.   30 tablet  3  . DISCONTD: carvedilol (COREG) 6.25 MG tablet Take 1 tablet (6.25 mg total) by mouth 2 (two) times daily with a meal.  60 tablet  6  . DISCONTD: clonazePAM (KLONOPIN) 1 MG tablet Take 1 tablet (1 mg total) by mouth 3 (three) times daily as needed.  90 tablet  0  . DISCONTD: sertraline (ZOLOFT) 50 MG tablet Take 50 mg by mouth daily. Will start Zoloft tomorrow after finishing celexa today.      Marland Kitchen amoxicillin (AMOXIL) 500 MG capsule Take 4 capsules once  30-60 minutes prior to procedure.  4 capsule  0  . carvedilol (COREG) 12.5 MG tablet Take 1 tablet (12.5 mg total) by mouth 2 (two) times daily with a meal.  60 tablet  3  . clonazePAM (KLONOPIN) 0.25 MG disintegrating tablet Take 1-2 tablets (0.25-0.5 mg total) by mouth 3 (three) times daily as needed.  120 tablet  0  . oxyCODONE (ROXICODONE) 5 MG immediate release tablet Take 1 tablet (5 mg total) by mouth every 4 (four) hours as needed for pain.  30 tablet  0  . peg 3350 powder (MOVIPREP) SOLR Take 1 kit (100 g total) by mouth once.  1 kit  0  . sertraline (ZOLOFT) 100 MG tablet Take 1 tablet (100  mg total) by mouth daily.  30 tablet  3    Exam:  BP 170/108  Pulse 77  Ht 5\' 2"  (1.575 m)  Wt 190 lb (86.183 kg)  BMI 34.75 kg/m2 Gen: Well NAD HEENT: EOMI,  MMM Lungs: CTABL Nl WOB Heart: RRR no MRG Abd: NABS, NT, ND Exts: Non edematous BL  LE, warm and well perfused.  Hair: Small scar on the back left corner of the scalp otherwise normal appearing scalp.  No results found for this or any previous visit (from the past 72 hour(s)).

## 2011-12-28 ENCOUNTER — Telehealth: Payer: Self-pay

## 2011-12-28 ENCOUNTER — Encounter: Payer: Self-pay | Admitting: Family Medicine

## 2011-12-28 NOTE — Assessment & Plan Note (Signed)
Anxiety seems to be doing well with Zoloft and intermittent Klonopin. Goal to wean off Klonopin completely.  Patient currently doing well.  We'll decrease to 0.25 mg tablets and followup in one month. Additional increase Zoloft to 100 mg daily

## 2011-12-28 NOTE — Telephone Encounter (Signed)
Pt had a missed call from [place] and was wondering if it was our office.

## 2011-12-28 NOTE — Telephone Encounter (Signed)
LM with pt letting her know that from looking in her chart I don't see where anyone from this office may have called her.

## 2011-12-28 NOTE — Assessment & Plan Note (Signed)
Unsure of the cause of her hair loss.  Seems to be occurring all at once without any patches.  I think the scar discovered on the scalp is an old wound and not related to hair loss.   Will investigate with TSH. Followup in one month

## 2011-12-28 NOTE — Assessment & Plan Note (Signed)
Blood pressure is elevated today. Patient is asymptomatic and her heart rate is in the 70s. I feel that she has room to increase her Coreg dose. Plan increase from 6.25 to 12.5 and followup in one month. Patient expresses understanding

## 2011-12-29 ENCOUNTER — Telehealth: Payer: Self-pay | Admitting: Internal Medicine

## 2011-12-29 NOTE — Telephone Encounter (Signed)
Patient advised to take her meds when she gets up in the morning.  Advised to take at least 30 minutes prior to the appt

## 2012-01-03 ENCOUNTER — Ambulatory Visit (HOSPITAL_COMMUNITY)
Admission: RE | Admit: 2012-01-03 | Discharge: 2012-01-03 | Disposition: A | Discharge status: Home / Self Care | Payer: Medicare Other | Source: Ambulatory Visit | Attending: Physical Medicine & Rehabilitation | Admitting: Physical Medicine & Rehabilitation

## 2012-01-03 DIAGNOSIS — M1610 Unilateral primary osteoarthritis, unspecified hip: Secondary | ICD-10-CM | POA: Insufficient documentation

## 2012-01-03 DIAGNOSIS — Z96649 Presence of unspecified artificial hip joint: Secondary | ICD-10-CM | POA: Diagnosis not present

## 2012-01-03 DIAGNOSIS — G8929 Other chronic pain: Secondary | ICD-10-CM | POA: Insufficient documentation

## 2012-01-03 DIAGNOSIS — M25551 Pain in right hip: Secondary | ICD-10-CM

## 2012-01-03 DIAGNOSIS — M169 Osteoarthritis of hip, unspecified: Secondary | ICD-10-CM | POA: Insufficient documentation

## 2012-01-03 DIAGNOSIS — M25559 Pain in unspecified hip: Secondary | ICD-10-CM | POA: Insufficient documentation

## 2012-01-04 ENCOUNTER — Ambulatory Visit: Payer: Medicaid Other | Admitting: Physical Medicine & Rehabilitation

## 2012-01-05 ENCOUNTER — Ambulatory Visit: Payer: Medicaid Other | Admitting: Family Medicine

## 2012-01-07 ENCOUNTER — Ambulatory Visit: Payer: Medicaid Other | Admitting: Physical Medicine & Rehabilitation

## 2012-01-07 ENCOUNTER — Telehealth: Payer: Self-pay | Admitting: *Deleted

## 2012-01-07 ENCOUNTER — Ambulatory Visit: Payer: Medicaid Other | Admitting: Family Medicine

## 2012-01-07 ENCOUNTER — Telehealth: Payer: Self-pay | Admitting: Family Medicine

## 2012-01-07 NOTE — Telephone Encounter (Signed)
Pt aware of normal xray results. 

## 2012-01-07 NOTE — Telephone Encounter (Signed)
Patient is calling to find out if she can take RKG Extreme - Raspberry Ketone and Green Coffee Bean.

## 2012-01-07 NOTE — Telephone Encounter (Signed)
Message copied by Caryl Ada on Thu Jan 07, 2012  3:56 PM ------      Message from: Ricarda Frame E      Created: Thu Jan 07, 2012  3:52 PM       Looks ok

## 2012-01-08 NOTE — Telephone Encounter (Signed)
Called and discussed with pt. Advised against.

## 2012-01-11 ENCOUNTER — Telehealth: Payer: Self-pay

## 2012-01-11 NOTE — Telephone Encounter (Signed)
Pt request Hip xray results.

## 2012-01-11 NOTE — Telephone Encounter (Signed)
Hip Xray without new problems, hardware is in good position.   Can discuss further at next visit.  No ortho referral needed

## 2012-01-11 NOTE — Telephone Encounter (Signed)
Please advise 

## 2012-01-11 NOTE — Telephone Encounter (Signed)
Pt informed of results.

## 2012-01-12 ENCOUNTER — Telehealth: Payer: Self-pay | Admitting: Internal Medicine

## 2012-01-12 NOTE — Telephone Encounter (Signed)
No charge. 

## 2012-01-14 ENCOUNTER — Encounter: Payer: Medicaid Other | Admitting: Internal Medicine

## 2012-01-25 ENCOUNTER — Other Ambulatory Visit: Payer: Self-pay | Admitting: Family Medicine

## 2012-01-25 ENCOUNTER — Encounter: Payer: Medicare Other | Attending: Physical Medicine & Rehabilitation

## 2012-01-25 ENCOUNTER — Ambulatory Visit (HOSPITAL_BASED_OUTPATIENT_CLINIC_OR_DEPARTMENT_OTHER): Payer: Medicaid Other | Admitting: Physical Medicine & Rehabilitation

## 2012-01-25 ENCOUNTER — Encounter: Payer: Self-pay | Admitting: Physical Medicine & Rehabilitation

## 2012-01-25 VITALS — BP 114/64 | HR 68 | Ht 62.0 in | Wt 192.0 lb

## 2012-01-25 DIAGNOSIS — M545 Low back pain, unspecified: Secondary | ICD-10-CM | POA: Insufficient documentation

## 2012-01-25 DIAGNOSIS — M76899 Other specified enthesopathies of unspecified lower limb, excluding foot: Secondary | ICD-10-CM

## 2012-01-25 DIAGNOSIS — M47817 Spondylosis without myelopathy or radiculopathy, lumbosacral region: Secondary | ICD-10-CM

## 2012-01-25 DIAGNOSIS — M79609 Pain in unspecified limb: Secondary | ICD-10-CM | POA: Diagnosis not present

## 2012-01-25 DIAGNOSIS — M47816 Spondylosis without myelopathy or radiculopathy, lumbar region: Secondary | ICD-10-CM

## 2012-01-25 DIAGNOSIS — M7061 Trochanteric bursitis, right hip: Secondary | ICD-10-CM

## 2012-01-25 NOTE — Patient Instructions (Signed)
You will have another injection in the lumbar area. You will need a driver If you respond favorably to this injection we will do a radiofrequency procedure You will need to see Dr. Dion Saucier to look at the right hip once again.

## 2012-01-25 NOTE — Progress Notes (Signed)
Subjective:    Patient ID: RAELEE [name], female    DOB: 12-28-1960, 51 y.o.   MRN: [id number]  HPI More than half the patient's pain went away for one or 2 days after the lumbar medial branch block at the L3-L4-L5 levels. Pain Inventory Average Pain 9 Pain Right Now 8 My pain is burning, stabbing and aching  In the last 24 hours, has pain interfered with the following? General activity 9 Relation with others 9 Enjoyment of life 10 What TIME of day is your pain at its worst? morning, daytime, evening and night Sleep (in general) Fair  Pain is worse with: walking, bending, sitting, inactivity and standing Pain improves with: rest Relief from Meds: 10  Mobility walk without assistance walk with assistance do you drive?  no Do you have any goals in this area?  yes  Function disabled: date disabled  I need assistance with the following:  bathing, meal prep, household duties and shopping  Neuro/Psych weakness trouble walking dizziness confusion depression anxiety  Prior Studies CT/MRI  Physicians involved in your care Roe Coombs, [place], Bethanie Dicker   Family History  Problem Relation Age of Onset  . Heart disease Mother   . Colon cancer Neg Hx    History   Social History  . Marital Status: Widowed    Spouse Name: N/A    Number of Children: N/A  . Years of Education: N/A   Social History Main Topics  . Smoking status: Current Some Day Smoker    Last Attempt to Quit: 12/23/2008  . Smokeless tobacco: Never Used  . Alcohol Use: No     goes to AA  . Drug Use: No  . Sexually Active: No   Other Topics Concern  . None   Social History Narrative   Just before her husband died, she had a miscarriage and started to drink beer heavily.  She quit secondary to her liver disease and has been quit for several years.  She denies the use of drugs - prescription or otherwise.     Past Surgical History  Procedure Date  . Orif hip fracture 2010    right  .  Upper gastrointestinal endoscopy 08/05/2007    esophageal ring, hiatal hernia, portal gastropathy  . Splenectomy     age 73  . Burr hole for subdural hematoma   . Upper gastrointestinal endoscopy 10/14/2011   Past Medical History  Diagnosis Date  . Cirrhosis   . Hiatal hernia   . Esophageal stricture   . Anxiety disorder   . 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 Hepatitis C, chronic   . History of alcohol abuse   . Depression   . History of hip fracture   . Allergic rhinitis   . HTN (hypertension)   . Anxiety   . Blood transfusion   . Cataract     MILD  . GERD (gastroesophageal reflux disease)   . Osteoporosis   . Ulcer    BP 114/64  Pulse 68  Ht 5\' 2"  (1.575 m)  Wt 192 lb (87.091 kg)  BMI 35.12 kg/m2  SpO2 97%     Review of Systems  Constitutional: Positive for diaphoresis and unexpected weight change.  Gastrointestinal: Positive for constipation.  Musculoskeletal: Positive for joint swelling.  Neurological: Positive for dizziness and weakness.  All other systems reviewed and are negative.       Objective:   Physical Exam  Musculoskeletal:       Lumbar  back: She exhibits decreased range of motion and tenderness.       Pain at the L5-S1 region in the paraspinal muscles.  Neurological: She has normal strength. No sensory deficit. Gait abnormal.          Assessment & Plan:  1. Lumbar spondylosis which responded for one to 2 days to medial branch blocks at L3-L4-L5 . We'll repeat this injection and if we did a similar response of greater than 50% relief, we will then schedule for lumbar radiofrequency procedure. I will give the patient a pamphlet on this. We spoke about the spine and used a spine model to demonstrate anatomy. Questions were answered.

## 2012-01-26 ENCOUNTER — Ambulatory Visit: Payer: Medicaid Other | Admitting: Family Medicine

## 2012-01-27 ENCOUNTER — Other Ambulatory Visit: Payer: Self-pay | Admitting: Physical Medicine & Rehabilitation

## 2012-01-27 DIAGNOSIS — R1030 Lower abdominal pain, unspecified: Secondary | ICD-10-CM

## 2012-01-27 DIAGNOSIS — M545 Low back pain: Secondary | ICD-10-CM

## 2012-01-27 DIAGNOSIS — M5416 Radiculopathy, lumbar region: Secondary | ICD-10-CM

## 2012-01-28 DIAGNOSIS — M545 Low back pain: Secondary | ICD-10-CM | POA: Diagnosis not present

## 2012-02-01 ENCOUNTER — Ambulatory Visit: Payer: Medicaid Other | Admitting: Family Medicine

## 2012-02-04 DIAGNOSIS — M5137 Other intervertebral disc degeneration, lumbosacral region: Secondary | ICD-10-CM | POA: Diagnosis not present

## 2012-02-05 ENCOUNTER — Ambulatory Visit (INDEPENDENT_AMBULATORY_CARE_PROVIDER_SITE_OTHER): Payer: Medicare Other | Admitting: Family Medicine

## 2012-02-05 ENCOUNTER — Encounter: Payer: Self-pay | Admitting: Family Medicine

## 2012-02-05 VITALS — BP 129/86 | HR 78 | Ht 62.0 in | Wt 191.0 lb

## 2012-02-05 DIAGNOSIS — L659 Nonscarring hair loss, unspecified: Secondary | ICD-10-CM

## 2012-02-05 DIAGNOSIS — R350 Frequency of micturition: Secondary | ICD-10-CM | POA: Diagnosis not present

## 2012-02-05 DIAGNOSIS — R300 Dysuria: Secondary | ICD-10-CM | POA: Diagnosis not present

## 2012-02-05 DIAGNOSIS — M549 Dorsalgia, unspecified: Secondary | ICD-10-CM

## 2012-02-05 LAB — POCT URINALYSIS DIPSTICK
Bilirubin, UA: NEGATIVE
Blood, UA: NEGATIVE
Ketones, UA: NEGATIVE
Leukocytes, UA: NEGATIVE
pH, UA: 6.5

## 2012-02-05 MED ORDER — OXYCODONE HCL 5 MG PO TABS: 5.0000 mg | ORAL_TABLET | ORAL | Status: DC | PRN

## 2012-02-05 NOTE — Patient Instructions (Addendum)
Thank you for coming in today. We will follow up the urine and I will call you if we need any antibiotics.  Please try Rogaine for hair loss.  For pain use oxycodone 1 pill a day to allow you to exercise.  Let me know if any other doctors want to prescribe pain medicine.  Return in 1-2 months.

## 2012-02-05 NOTE — Assessment & Plan Note (Signed)
Will start low-dose oxycodone so that patient may exercise and lose weight. Followup in one month

## 2012-02-05 NOTE — Addendum Note (Signed)
Addended by: Jennette Bill on: 02/05/2012 12:02 PM   Modules accepted: Orders

## 2012-02-05 NOTE — Assessment & Plan Note (Signed)
I'm not sure the cause of her leg or loss. It may be sex hormone imbalance because of liver failure, or some other cause. It is not appear to be scarring alopecia or alopecia areata.  Recommend trying over-the-counter Rogaine

## 2012-02-05 NOTE — Progress Notes (Signed)
Wendy [name] is a 51 y.o. female who presents to [facility] today for   1) back pain: Patient continues to have lumbar back and bilateral hip pain. She is under the management of pain management doctor who is doing facet injections and considering nerve ablation.  She is having difficulty exercising because of pain. She would like to resume pain medications to lower exercising continue losing weight. She denies any radiating pain numbness weakness bowel or bladder difficulty.  2) hair loss: Patient continues to have hair loss.  She wonders what she can do. She denies any hot or cold intolerance fevers or chills.   3) patient notes about one month of urinary frequency without pain fevers chills abdominal pain back pain nausea vomiting or diarrhea.     PMH: Reviewed significant for liver failure History  Substance Use Topics  . Smoking status: Current Everyday Smoker -- 0.5 packs/day    Types: Cigarettes    Last Attempt to Quit: 12/23/2008  . Smokeless tobacco: Never Used  . Alcohol Use: No     goes to AA   ROS as above  Medications reviewed. Current Outpatient Prescriptions  Medication Sig Dispense Refill  . alendronate (FOSAMAX) 70 MG tablet Take 70 mg by mouth every 7 (seven) days. Take with a full glass of water on an empty stomach.      Marland Kitchen amoxicillin (AMOXIL) 500 MG capsule Take 4 capsules once  30-60 minutes prior to procedure.  4 capsule  0  . calcium-vitamin D (OSCAL-500) 500-400 MG-UNIT per tablet 2 tablets 2 (two) times daily.       . carvedilol (COREG) 12.5 MG tablet Take 1 tablet (12.5 mg total) by mouth 2 (two) times daily with a meal.  60 tablet  3  . clonazePAM (KLONOPIN) 0.25 MG disintegrating tablet Take 1-2 tablets (0.25-0.5 mg total) by mouth 3 (three) times daily as needed.  120 tablet  0  . furosemide (LASIX) 20 MG tablet Take 20 mg by mouth daily.        Marland Kitchen lactulose (CHRONULAC) 10 GM/15ML solution Take 20 g by mouth 3 (three) times daily. Cannot remember dose managed  by gastroenterology       . loratadine (CLARITIN) 10 MG tablet Take 1 tablet (10 mg total) by mouth daily.  30 tablet  11  . omeprazole (PRILOSEC) 20 MG capsule Take 1 capsule (20 mg total) by mouth daily. Take 30-60 minutes before breakfast  30 capsule  11  . oxyCODONE (ROXICODONE) 5 MG immediate release tablet Take 1 tablet (5 mg total) by mouth every 4 (four) hours as needed for pain.  30 tablet  0  . peg 3350 powder (MOVIPREP) SOLR Take 1 kit (100 g total) by mouth once.  1 kit  0  . Prenatal Vit-Fe Fumarate-FA (PRENATAL FORMULA) 28-0.8 MG TABS Take 1 tablet by mouth daily.        . rifaximin (XIFAXAN) 550 MG TABS Take 1 tablet (550 mg total) by mouth 2 (two) times daily.  60 tablet  11  . sertraline (ZOLOFT) 100 MG tablet Take 1 tablet (100 mg total) by mouth daily.  30 tablet  3  . spironolactone (ALDACTONE) 25 MG tablet TAKE 1 TABLET BY MOUTH EVERY DAY  30 tablet  3  . DISCONTD: oxyCODONE (ROXICODONE) 5 MG immediate release tablet Take 1 tablet (5 mg total) by mouth every 4 (four) hours as needed for pain.  30 tablet  0    Exam:  BP 129/86  Pulse  78  Ht 5\' 2"  (1.575 m)  Wt 191 lb (86.637 kg)  BMI 34.93 kg/m2 Gen: Well NAD HEENT: EOMI,  MMM Lungs: CTABL Nl WOB Heart: RRR no MRG Abd: NABS, NT, ND Exts: Non edematous BL  LE, warm and well perfused.  Scalp: No scarring. Thinning hair but no patches of hair loss.  No results found for this or any previous visit (from the past 72 hour(s)).

## 2012-02-05 NOTE — Assessment & Plan Note (Signed)
Attempted to obtain a urinalysis today. Patient was unable to void. I gave her urine cup which she can bring back for urinalysis some point in the next few days.

## 2012-02-06 ENCOUNTER — Other Ambulatory Visit: Payer: Self-pay | Admitting: Internal Medicine

## 2012-02-06 ENCOUNTER — Other Ambulatory Visit: Payer: Self-pay | Admitting: Family Medicine

## 2012-02-09 ENCOUNTER — Telehealth: Payer: Self-pay | Admitting: Family Medicine

## 2012-02-09 DIAGNOSIS — K7460 Unspecified cirrhosis of liver: Secondary | ICD-10-CM

## 2012-02-09 NOTE — Telephone Encounter (Signed)
Patient is calling wanting to speak to Dr. Denyse Amass about an upcoming surgery.  She wanted to schedule to see Dr. Denyse Amass one last time, but he did not have any openings so she asked to leave him a message.

## 2012-02-09 NOTE — Telephone Encounter (Signed)
Wendy [name] also calling for results of last labs taken.  Need to have this info before her surgery.  She really need to talk to provider.

## 2012-02-10 ENCOUNTER — Telehealth: Payer: Self-pay | Admitting: Physical Medicine & Rehabilitation

## 2012-02-10 NOTE — Telephone Encounter (Signed)
Pt is in process of having another procedure done and is not able to make it in the office.  She will have all records sent.

## 2012-02-10 NOTE — Telephone Encounter (Signed)
Pt is calling again today and is asking to speak with Dr Denyse Amass

## 2012-02-10 NOTE — Telephone Encounter (Signed)
Called patient back about labs.   Urine looks good.   Will need surgery for hip pain. Will get CBC, CMP. Will route results to [facility] at [facility] orth[facility]ics.   Orders in.   F/u in 1 month.

## 2012-02-10 NOTE — Telephone Encounter (Signed)
Having to cx 02/11/12 appt.  Please call so she can explain why, has to do with Dr Dion Saucier as well

## 2012-02-11 ENCOUNTER — Ambulatory Visit: Payer: Medicaid Other | Admitting: Physical Medicine & Rehabilitation

## 2012-02-17 ENCOUNTER — Other Ambulatory Visit: Payer: Medicare Other

## 2012-02-17 DIAGNOSIS — M5137 Other intervertebral disc degeneration, lumbosacral region: Secondary | ICD-10-CM | POA: Diagnosis not present

## 2012-02-18 ENCOUNTER — Other Ambulatory Visit: Payer: Medicare Other

## 2012-02-18 ENCOUNTER — Other Ambulatory Visit: Payer: Self-pay | Admitting: Internal Medicine

## 2012-02-18 ENCOUNTER — Telehealth: Payer: Self-pay | Admitting: Family Medicine

## 2012-02-18 DIAGNOSIS — M25559 Pain in unspecified hip: Secondary | ICD-10-CM | POA: Diagnosis not present

## 2012-02-18 NOTE — Telephone Encounter (Signed)
Pain meds are not working and needs to increase amount.  Needs to talk to nurse about what she can do.

## 2012-02-18 NOTE — Telephone Encounter (Signed)
No controlled medication management can be done unless pt is in the office at an appt.  Please have pt make appt.

## 2012-02-18 NOTE — Telephone Encounter (Signed)
Returned call to patient.  Wants to know if she can have enough Klonopin refilled to last until her appt with new PCP (Dr. Gwenlyn Saran) on 03/01/12.  Also, c/o current dose of oxycodone not helping with pain.  Patient states pain is worsening and she can hardly sit up.   Would like to see if oxycodone dose can be increased--had discussed this with Dr. Denyse Amass at last office visit.  Has appt today with Dr. Dion Saucier to discuss possible surgery and may get injection.  Dr. Denyse Amass out of office and will route note to Dr. Hulen Luster.  Gaylene Brooks, RN

## 2012-02-18 NOTE — Telephone Encounter (Signed)
Returned call to patient and informed she will need office visit to discuss pain medication.  Patient has appt on 03/01/12 and will discuss at that time.  Gaylene Brooks, RN

## 2012-02-22 ENCOUNTER — Telehealth: Payer: Self-pay | Admitting: Family Medicine

## 2012-02-22 NOTE — Telephone Encounter (Signed)
Pt need rx refill on her clonazepam.  Patient will keep her appt on 7/9.  Will be able to wait till her appt for her pain med, but the clonazepam is needed now.  Please contact patient to inform when she get it.

## 2012-02-23 ENCOUNTER — Ambulatory Visit: Payer: Medicaid Other | Admitting: Family Medicine

## 2012-02-24 ENCOUNTER — Other Ambulatory Visit: Payer: Self-pay | Admitting: Orthopedic Surgery

## 2012-02-24 ENCOUNTER — Other Ambulatory Visit: Payer: Self-pay | Admitting: Family Medicine

## 2012-02-24 DIAGNOSIS — T8484XA Pain due to internal orthopedic prosthetic devices, implants and grafts, initial encounter: Secondary | ICD-10-CM

## 2012-02-24 NOTE — Telephone Encounter (Signed)
Patient is calling for a refill on her [facility]opin [facility] to [facility] [address].  She has an appt on 7/9 and would like enough to last until that appt at least.  She only has enough for today.

## 2012-02-24 NOTE — Telephone Encounter (Signed)
Called the pharmacy and clonazepam Rx was ready for patient to pick up.

## 2012-02-26 ENCOUNTER — Other Ambulatory Visit: Payer: Self-pay | Admitting: *Deleted

## 2012-02-26 DIAGNOSIS — F4001 Agoraphobia with panic disorder: Secondary | ICD-10-CM

## 2012-02-29 ENCOUNTER — Other Ambulatory Visit: Payer: Medicare Other

## 2012-03-01 ENCOUNTER — Ambulatory Visit: Payer: Medicare Other | Admitting: Family Medicine

## 2012-03-01 ENCOUNTER — Inpatient Hospital Stay
Admission: RE | Admit: 2012-03-01 | Discharge: 2012-03-01 | Payer: Medicare Other | Source: Ambulatory Visit | Attending: Orthopedic Surgery | Admitting: Orthopedic Surgery

## 2012-03-03 ENCOUNTER — Other Ambulatory Visit: Payer: Medicare Other

## 2012-03-04 ENCOUNTER — Other Ambulatory Visit: Payer: Self-pay | Admitting: Family Medicine

## 2012-03-04 ENCOUNTER — Encounter: Payer: Medicaid Other | Admitting: Internal Medicine

## 2012-03-04 DIAGNOSIS — M25551 Pain in right hip: Secondary | ICD-10-CM

## 2012-03-07 ENCOUNTER — Other Ambulatory Visit: Payer: Self-pay | Admitting: Internal Medicine

## 2012-03-07 NOTE — Telephone Encounter (Signed)
Would you like me to refill her Robaxin Sir?

## 2012-03-08 ENCOUNTER — Ambulatory Visit
Admission: RE | Admit: 2012-03-08 | Discharge: 2012-03-08 | Disposition: A | Discharge status: Home / Self Care | Payer: Medicare Other | Source: Ambulatory Visit | Attending: Orthopedic Surgery | Admitting: Orthopedic Surgery

## 2012-03-08 DIAGNOSIS — M25559 Pain in unspecified hip: Secondary | ICD-10-CM | POA: Diagnosis not present

## 2012-03-08 DIAGNOSIS — K7460 Unspecified cirrhosis of liver: Secondary | ICD-10-CM | POA: Diagnosis not present

## 2012-03-08 DIAGNOSIS — T8484XA Pain due to internal orthopedic prosthetic devices, implants and grafts, initial encounter: Secondary | ICD-10-CM

## 2012-03-08 DIAGNOSIS — K5730 Diverticulosis of large intestine without perforation or abscess without bleeding: Secondary | ICD-10-CM | POA: Diagnosis not present

## 2012-03-08 MED ORDER — IOHEXOL 300 MG/ML  SOLN
100.0000 mL | Freq: Once | INTRAMUSCULAR | Status: AC | PRN
  Administered 2012-03-08: 100 mL via INTRAVENOUS

## 2012-03-08 NOTE — Telephone Encounter (Signed)
I believe she should be obtaining this from PCP or other MD

## 2012-03-09 ENCOUNTER — Telehealth: Payer: Self-pay | Admitting: Internal Medicine

## 2012-03-09 ENCOUNTER — Telehealth: Payer: Self-pay | Admitting: Family Medicine

## 2012-03-09 NOTE — Telephone Encounter (Signed)
Is needing to talk to nurse about getting pain meds before her appt with Dr Gwenlyn Saran on 7/24

## 2012-03-09 NOTE — Telephone Encounter (Signed)
Patient has been referred to [facility] for recurrent RLQ pain and hip pain, she has an appt with a orthopedic surgeon on 03/23/12.  She did have a CT scan yesterday and was told to follow up with GI.  She was instructed to call us about the diverticulosis that was noted on the CT and see if this could be a cause for her pain.  I did explain that the sigmoid is on the left side, but I will have Dr. Leone Payor review and advise.

## 2012-03-09 NOTE — Telephone Encounter (Signed)
Patient takes oxycodone and got last rx from Dr. Denyse Amass on 02/05/12.  Is due to get med refilled, but does not have an appt until 03/16/12.  Will route note to Dr. Aviva Signs since Dr. Gwenlyn Saran not in office and call patient back.  Gaylene Brooks, RN

## 2012-03-10 NOTE — Telephone Encounter (Signed)
Pt called today to find out what the doctor was going to do,

## 2012-03-10 NOTE — Telephone Encounter (Signed)
CT reviewed - T 12 compression fracture and spine problems could be cause from pinched nerves  Do not think diverticulosis is cause  She may schedule an office visit

## 2012-03-10 NOTE — Telephone Encounter (Signed)
Patient advised of Dr. Gessner's recommendations 

## 2012-03-10 NOTE — Telephone Encounter (Signed)
Left message for patient to call back  

## 2012-03-11 ENCOUNTER — Other Ambulatory Visit: Payer: Self-pay | Admitting: Family Medicine

## 2012-03-11 DIAGNOSIS — M549 Dorsalgia, unspecified: Secondary | ICD-10-CM

## 2012-03-11 MED ORDER — OXYCODONE HCL 5 MG PO TABS: 5.0000 mg | ORAL_TABLET | ORAL | Status: DC | PRN

## 2012-03-11 NOTE — Telephone Encounter (Signed)
Pt still waiting for call regarding pain medication.  Have not heard anything back since original call made.  Please call her before end of business day.

## 2012-03-11 NOTE — Telephone Encounter (Signed)
I haven't received pharmacy request. Will await for it.

## 2012-03-11 NOTE — Telephone Encounter (Signed)
Will send message to Dr. Aviva Signs

## 2012-03-11 NOTE — Telephone Encounter (Signed)
Called patient to advise that RX is ready to pick up. States she is unable to ride in car. Her housemate  Lowella Dandy will pick up.   Now states she needs Klonopin refilled . States pharmacy  was suppose to have sent [place] request.  Unable to find a fax from pharmacy . Advised her to contact pharmacy again .  Will forward back to Dr. Aviva Signs.

## 2012-03-14 NOTE — Telephone Encounter (Signed)
Request received and placed in  Dr. Willaim Rayas box.

## 2012-03-15 ENCOUNTER — Other Ambulatory Visit: Payer: Self-pay | Admitting: Family Medicine

## 2012-03-15 DIAGNOSIS — F4001 Agoraphobia with panic disorder: Secondary | ICD-10-CM

## 2012-03-15 MED ORDER — CLONAZEPAM 0.25 MG PO TBDP: 0.2500 mg | ORAL_TABLET | Freq: Three times a day (TID) | ORAL | Status: DC | PRN

## 2012-03-16 MED ORDER — CLONAZEPAM 0.25 MG PO TBDP: 0.2500 mg | ORAL_TABLET | Freq: Three times a day (TID) | ORAL | Status: DC | PRN

## 2012-03-16 NOTE — Telephone Encounter (Signed)
I will place the order today in the afternoon since I am in a rotation outside of the office and I don't have capability to print the prescription.

## 2012-03-16 NOTE — Telephone Encounter (Signed)
Dr. Aviva Signs printed out RX for klonopin and I faxed to pharmacy.

## 2012-03-17 ENCOUNTER — Ambulatory Visit (INDEPENDENT_AMBULATORY_CARE_PROVIDER_SITE_OTHER): Payer: Medicare Other | Admitting: Family Medicine

## 2012-03-17 ENCOUNTER — Encounter: Payer: Self-pay | Admitting: Family Medicine

## 2012-03-17 VITALS — BP 122/80 | HR 68 | Temp 98.4°F | Ht 62.0 in | Wt 181.0 lb

## 2012-03-17 DIAGNOSIS — M549 Dorsalgia, unspecified: Secondary | ICD-10-CM

## 2012-03-17 DIAGNOSIS — M76899 Other specified enthesopathies of unspecified lower limb, excluding foot: Secondary | ICD-10-CM | POA: Diagnosis not present

## 2012-03-17 DIAGNOSIS — F4001 Agoraphobia with panic disorder: Secondary | ICD-10-CM

## 2012-03-17 DIAGNOSIS — M7061 Trochanteric bursitis, right hip: Secondary | ICD-10-CM

## 2012-03-17 MED ORDER — METHOCARBAMOL 500 MG PO TABS: 500.0000 mg | ORAL_TABLET | Freq: Three times a day (TID) | ORAL | Status: DC | PRN

## 2012-03-17 MED ORDER — OXYCODONE HCL 5 MG PO TABS: 10.0000 mg | ORAL_TABLET | ORAL | Status: DC | PRN

## 2012-03-17 MED ORDER — TRAMADOL HCL 50 MG PO TABS: 50.0000 mg | ORAL_TABLET | Freq: Two times a day (BID) | ORAL | Status: DC

## 2012-03-17 MED ORDER — CLONAZEPAM 0.5 MG PO TABS: 0.5000 mg | ORAL_TABLET | Freq: Three times a day (TID) | ORAL | Status: DC | PRN

## 2012-03-17 NOTE — Assessment & Plan Note (Signed)
Currently on zoloft 100mg  daily. At next visit will increase to zoloft 200mg  daily. Also will adjust klonopin to 0.5mg  tid, with hope to decrease to bid at next visit. Patient agreed with this.

## 2012-03-17 NOTE — Patient Instructions (Addendum)
It was great meeting you! Please let us know who you will be seeing for the second referral for the hip. I'll see you back in 1 month.

## 2012-03-17 NOTE — Telephone Encounter (Signed)
Patient is calling back with info for Dr. Gwenlyn Saran.  Her appt is with Dr. Cleda Clarks at [facility].

## 2012-03-17 NOTE — Progress Notes (Signed)
Patient ID: Wendy [name], female   DOB: 08-30-1960, 51 y.o.   MRN: [id number] Patient ID: Wendy [name]    DOB: September 27, 1960, 51 y.o.   MRN: [id number] --- Subjective:  Wendy [name] is a 51 y.o.female who presents to meet new PCP and to talk about medication issue. Medications:  Was started in June on low dose oxycodone to help with right hip and groin pain to allow her to move and exercise. This has only mildly helped. 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 Orthopedist has recommended second opinion for possible hip replacement.  Pain starts in right hip and radiates to right groin.   Anxiety: on zoloft 100mg  daily, which was started last month. Also on klonopin for anxiety attacks associated with shortness of breath and feeling of doom.    ROS: see HPI Past Medical History: reviewed and updated medications and allergies. Social History: Tobacco: current smoker Objective: Filed Vitals:   03/17/12 1113  BP: 122/80  Pulse: 68  Temp: 98.4 F (36.9 C)    Physical Examination:   General appearance - alert, well appearing, and in no distress Chest - clear to auscultation, no wheezes, rales or rhonchi, symmetric air entry Heart - normal rate, regular rhythm, normal S1, S2, no murmurs, rubs, clicks or gallops Abdomen - soft, nontender, nondistended, no masses or organomegaly Right hip: difficult range of motion secondary to pain, normal knee extension bilaterally, tenderness to palpation along hip joint.

## 2012-03-17 NOTE — Assessment & Plan Note (Addendum)
Plan for second opinion by ortho for hip repair. Will increase to oxycodone 10 as needed for pain. Patient signed contract with understanding that this would not be a permanent medication and that she will be weaned after possible surgical intervention.  refilled ribaxin for muscle spasms

## 2012-03-18 NOTE — Telephone Encounter (Signed)
Patient is calling because she was supposed to have labs drawn, but Dr. Truitt Leep orders had expired so she needs them renewed so she can have the labs drawn when she comes on 8/6.

## 2012-03-20 ENCOUNTER — Other Ambulatory Visit: Payer: Self-pay | Admitting: Family Medicine

## 2012-03-28 ENCOUNTER — Telehealth: Payer: Self-pay | Admitting: Family Medicine

## 2012-03-28 NOTE — Telephone Encounter (Signed)
Patient is calling because she thought Methocarbamol instead of [facility]ad[facility] [address].

## 2012-03-29 ENCOUNTER — Encounter: Payer: Medicare Other | Admitting: Home Health Services

## 2012-03-29 ENCOUNTER — Other Ambulatory Visit: Payer: Self-pay | Admitting: Family Medicine

## 2012-03-29 MED ORDER — TRAMADOL HCL 50 MG PO TABS: 50.0000 mg | ORAL_TABLET | Freq: Three times a day (TID) | ORAL | Status: AC | PRN

## 2012-03-29 NOTE — Telephone Encounter (Signed)
Sent Rx for tramadol 50mg  q6/prn pain

## 2012-03-29 NOTE — Telephone Encounter (Signed)
Fwd. To Dr. Gwenlyn Saran for review. Lorenda Hatchet, Renato Battles

## 2012-04-01 NOTE — Telephone Encounter (Signed)
Patient is calling because she says it isn't the Tramadol she needs, because she has never taken it, but what she needs is Methocarbomol.  She said that she wasn't given an Rx for Methocarbamol when she last came in.

## 2012-04-01 NOTE — Telephone Encounter (Signed)
Fwd. To Dr.Losq. .Tokiko Diefenderfer  

## 2012-04-02 ENCOUNTER — Other Ambulatory Visit: Payer: Self-pay | Admitting: Family Medicine

## 2012-04-02 MED ORDER — METHOCARBAMOL 500 MG PO TABS: 500.0000 mg | ORAL_TABLET | Freq: Four times a day (QID) | ORAL | Status: DC

## 2012-04-02 NOTE — Telephone Encounter (Signed)
Sent Rx for robaxin 500mg  #30 pills.

## 2012-04-04 ENCOUNTER — Telehealth: Payer: Self-pay | Admitting: *Deleted

## 2012-04-04 DIAGNOSIS — F329 Major depressive disorder, single episode, unspecified: Secondary | ICD-10-CM | POA: Diagnosis not present

## 2012-04-04 DIAGNOSIS — M25559 Pain in unspecified hip: Secondary | ICD-10-CM | POA: Diagnosis not present

## 2012-04-04 DIAGNOSIS — K7460 Unspecified cirrhosis of liver: Secondary | ICD-10-CM | POA: Diagnosis not present

## 2012-04-04 DIAGNOSIS — K219 Gastro-esophageal reflux disease without esophagitis: Secondary | ICD-10-CM | POA: Diagnosis not present

## 2012-04-04 DIAGNOSIS — M1610 Unilateral primary osteoarthritis, unspecified hip: Secondary | ICD-10-CM | POA: Diagnosis not present

## 2012-04-04 DIAGNOSIS — Z79899 Other long term (current) drug therapy: Secondary | ICD-10-CM | POA: Diagnosis not present

## 2012-04-04 DIAGNOSIS — I10 Essential (primary) hypertension: Secondary | ICD-10-CM | POA: Diagnosis not present

## 2012-04-04 DIAGNOSIS — B1920 Unspecified viral hepatitis C without hepatic coma: Secondary | ICD-10-CM | POA: Diagnosis not present

## 2012-04-04 NOTE — Telephone Encounter (Signed)
Pt will run out of pain meds today and doesn't have an appt until 8.27 would like to have something called in or Rx left for her to p/u.Wendy [name] Dwight

## 2012-04-05 ENCOUNTER — Telehealth: Payer: Self-pay | Admitting: Family Medicine

## 2012-04-05 DIAGNOSIS — M549 Dorsalgia, unspecified: Secondary | ICD-10-CM

## 2012-04-05 MED ORDER — OXYCODONE HCL 5 MG PO TABS: 10.0000 mg | ORAL_TABLET | ORAL | Status: DC | PRN

## 2012-04-05 NOTE — Telephone Encounter (Signed)
Called patient and left message on phone. Will refill medicine just this once. I was clear on the phone that this is not something I will do again and that I need to see my patients before they run out. This will be a one time occasion. Wrote for oxycodone 5mg  #40. Rx at front for pick up.

## 2012-04-06 ENCOUNTER — Telehealth: Payer: Self-pay | Admitting: Family Medicine

## 2012-04-06 NOTE — Telephone Encounter (Signed)
Patient calls because she is concerned about the message that Dr. Gwenlyn Saran left on her voicemail.  She doesn't want MD to think she is angry but she does not feel she has taken medication more than prescribed.  She is concerned because the RX given on 08/13 for # 40 tabs will only last her about 6 days. Directions are 2 tabs every four hours as needed for pain. States she takes 6-8 tabs a day . Her appointment is August 27 and she will run out before then.   Advised will send message to MD .

## 2012-04-06 NOTE — Telephone Encounter (Signed)
Pt is wanting to speak w/ nurse about the pain meds that she rec'd

## 2012-04-07 ENCOUNTER — Other Ambulatory Visit: Payer: Self-pay | Admitting: Family Medicine

## 2012-04-07 DIAGNOSIS — R1310 Dysphagia, unspecified: Secondary | ICD-10-CM

## 2012-04-07 DIAGNOSIS — M549 Dorsalgia, unspecified: Secondary | ICD-10-CM

## 2012-04-07 DIAGNOSIS — K219 Gastro-esophageal reflux disease without esophagitis: Secondary | ICD-10-CM

## 2012-04-07 MED ORDER — OXYCODONE HCL 5 MG PO TABS: 10.0000 mg | ORAL_TABLET | ORAL | Status: DC | PRN

## 2012-04-07 NOTE — Telephone Encounter (Signed)
Dr. Gwenlyn Saran has printed out an Rx. Patient notified may pick up.

## 2012-04-07 NOTE — Telephone Encounter (Signed)
Refilled oxycodone 5mg  enough to hold patient over to next visit. SHe states she takes 6-8 pills per day. Will reevaluate dose and amount at next visit. Rx placed at front office for pickup.

## 2012-04-11 ENCOUNTER — Ambulatory Visit: Payer: Medicaid Other | Admitting: Family Medicine

## 2012-04-11 ENCOUNTER — Telehealth: Payer: Self-pay | Admitting: Family Medicine

## 2012-04-11 NOTE — Telephone Encounter (Signed)
Had to resch appt w/ [facility] for 9/5 due to her having to be in [place] at an appt there.  She is concerned about her meds and wants to know if she should see someone else sooner or can she get her meds?  Would like to speak with Dr Gwenlyn Saran

## 2012-04-11 NOTE — Telephone Encounter (Signed)
Called patient she is unable to keep her original appointment due to appointments at [facility] but has rescheduled for 04/28/12 and is wanting to know if Dr Gwenlyn Saran will fill enough of the oxycodone to make it to that appointment.Wendy [name], [name]

## 2012-04-14 ENCOUNTER — Other Ambulatory Visit: Payer: Self-pay | Admitting: Family Medicine

## 2012-04-14 DIAGNOSIS — M549 Dorsalgia, unspecified: Secondary | ICD-10-CM

## 2012-04-14 MED ORDER — OXYCODONE HCL 5 MG PO TABS: 10.0000 mg | ORAL_TABLET | ORAL | Status: DC | PRN

## 2012-04-14 NOTE — Telephone Encounter (Signed)
Will not refill medication over the phone after this time.  Asked [name] to call patient to let her know that this is the very last time.

## 2012-04-15 NOTE — Telephone Encounter (Signed)
Called patient and told her that Rx was at front desk ready for her to pick up. Patient said she would come pick it up some time next week. Also told her that this is the last time the pain med would be refilled without being seen in the office.Busick, [name]

## 2012-04-19 ENCOUNTER — Telehealth: Payer: Self-pay | Admitting: Physical Medicine & Rehabilitation

## 2012-04-19 ENCOUNTER — Ambulatory Visit: Payer: Medicare Other | Admitting: Family Medicine

## 2012-04-19 DIAGNOSIS — M25559 Pain in unspecified hip: Secondary | ICD-10-CM | POA: Diagnosis not present

## 2012-04-19 DIAGNOSIS — Z79899 Other long term (current) drug therapy: Secondary | ICD-10-CM | POA: Diagnosis not present

## 2012-04-19 DIAGNOSIS — K7460 Unspecified cirrhosis of liver: Secondary | ICD-10-CM | POA: Diagnosis not present

## 2012-04-19 DIAGNOSIS — M169 Osteoarthritis of hip, unspecified: Secondary | ICD-10-CM | POA: Diagnosis not present

## 2012-04-19 DIAGNOSIS — M545 Low back pain, unspecified: Secondary | ICD-10-CM | POA: Diagnosis not present

## 2012-04-19 NOTE — Telephone Encounter (Signed)
Please call about her pain.

## 2012-04-19 NOTE — Telephone Encounter (Signed)
LM for pt to call back.

## 2012-04-20 NOTE — Telephone Encounter (Signed)
I spoke to pt and she didn't make any sense. She started talking about pocketbooks and shopping for her granddaughter. I asked her if she called [place] about her pain and she had no clue what I was talking about. She then started talking about going to aquatic therapy. I'm not sure if she was just letting us know that she was going or what.

## 2012-04-28 ENCOUNTER — Ambulatory Visit: Payer: Medicare Other | Admitting: Family Medicine

## 2012-05-02 ENCOUNTER — Telehealth: Payer: Self-pay | Admitting: Family Medicine

## 2012-05-02 NOTE — Telephone Encounter (Signed)
Spoke with patient for several minutes most of which did not make any sense but basically she wanted to let Dr Gwenlyn Saran know that per patient Dr Dion Saucier suggest she have a complete check up done to try and determine what is causing her leg and back pain. I told patient we would try and get the records from their office but she may have to sign a ROI when she comes to her appointment tomorrow. Also told patient that Dr Gwenlyn Saran would most likely not have time to call her before her appointment today as she is seeing patients.Synia Douglass, [name]

## 2012-05-02 NOTE — Telephone Encounter (Signed)
Needs to talk to Dr Gwenlyn Saran prior to her appt tomorrow - has something to do with her appt at [facility]

## 2012-05-03 ENCOUNTER — Telehealth: Payer: Self-pay | Admitting: Family Medicine

## 2012-05-03 ENCOUNTER — Ambulatory Visit: Payer: Medicare Other | Admitting: Family Medicine

## 2012-05-03 NOTE — Telephone Encounter (Signed)
Patient is calling to let Dr. Gwenlyn Saran know that the Medical Records should be on there way.  She called yesterday and found out that the Medical Records have not been sent yet, so she got that ball rolling.  She just wants to make sure that they get here before her extensive Pap Smear on 9/24.  Dr. Dion Saucier has said that he thinks the pain she is having is a female issue.  She wants to know if the Medical Records do not get here before her in time for that appt.

## 2012-05-03 NOTE — Telephone Encounter (Signed)
Please call Ms. Jessop back.  Not able to keep appt today.

## 2012-05-03 NOTE — Telephone Encounter (Signed)
Will notify Dr Gwenlyn Saran.Shalen Petrak, [name]

## 2012-05-10 DIAGNOSIS — M545 Low back pain: Secondary | ICD-10-CM | POA: Diagnosis not present

## 2012-05-10 DIAGNOSIS — K7460 Unspecified cirrhosis of liver: Secondary | ICD-10-CM | POA: Diagnosis not present

## 2012-05-10 DIAGNOSIS — M25559 Pain in unspecified hip: Secondary | ICD-10-CM | POA: Diagnosis not present

## 2012-05-10 DIAGNOSIS — I10 Essential (primary) hypertension: Secondary | ICD-10-CM | POA: Diagnosis not present

## 2012-05-10 DIAGNOSIS — IMO0001 Reserved for inherently not codable concepts without codable children: Secondary | ICD-10-CM | POA: Diagnosis not present

## 2012-05-13 ENCOUNTER — Ambulatory Visit: Payer: Medicare Other | Admitting: Internal Medicine

## 2012-05-17 ENCOUNTER — Other Ambulatory Visit: Payer: Self-pay | Admitting: Family Medicine

## 2012-05-17 ENCOUNTER — Ambulatory Visit: Payer: Medicare Other | Admitting: Family Medicine

## 2012-05-23 ENCOUNTER — Other Ambulatory Visit (HOSPITAL_COMMUNITY)
Admission: RE | Admit: 2012-05-23 | Discharge: 2012-05-23 | Disposition: A | Discharge status: Home / Self Care | Payer: Medicare Other | Source: Ambulatory Visit | Attending: Family Medicine | Admitting: Family Medicine

## 2012-05-23 ENCOUNTER — Encounter: Payer: Self-pay | Admitting: Family Medicine

## 2012-05-23 ENCOUNTER — Ambulatory Visit (INDEPENDENT_AMBULATORY_CARE_PROVIDER_SITE_OTHER): Payer: Medicare Other | Admitting: Family Medicine

## 2012-05-23 VITALS — BP 114/76 | HR 89 | Temp 98.2°F | Ht 62.0 in | Wt 180.7 lb

## 2012-05-23 DIAGNOSIS — Z124 Encounter for screening for malignant neoplasm of cervix: Secondary | ICD-10-CM | POA: Insufficient documentation

## 2012-05-23 DIAGNOSIS — M549 Dorsalgia, unspecified: Secondary | ICD-10-CM | POA: Diagnosis not present

## 2012-05-23 DIAGNOSIS — F411 Generalized anxiety disorder: Secondary | ICD-10-CM | POA: Diagnosis not present

## 2012-05-23 DIAGNOSIS — M25559 Pain in unspecified hip: Secondary | ICD-10-CM | POA: Diagnosis not present

## 2012-05-23 DIAGNOSIS — N898 Other specified noninflammatory disorders of vagina: Secondary | ICD-10-CM | POA: Diagnosis not present

## 2012-05-23 DIAGNOSIS — F419 Anxiety disorder, unspecified: Secondary | ICD-10-CM

## 2012-05-23 DIAGNOSIS — R300 Dysuria: Secondary | ICD-10-CM | POA: Diagnosis not present

## 2012-05-23 DIAGNOSIS — M25551 Pain in right hip: Secondary | ICD-10-CM

## 2012-05-23 LAB — POCT WET PREP (WET MOUNT)

## 2012-05-23 MED ORDER — OXYCODONE HCL 5 MG PO TABS: 10.0000 mg | ORAL_TABLET | Freq: Three times a day (TID) | ORAL | Status: DC | PRN

## 2012-05-23 MED ORDER — METHOCARBAMOL 500 MG PO TABS: 500.0000 mg | ORAL_TABLET | Freq: Three times a day (TID) | ORAL | Status: DC | PRN

## 2012-05-23 MED ORDER — CLONAZEPAM 1 MG PO TABS: 1.0000 mg | ORAL_TABLET | Freq: Three times a day (TID) | ORAL | Status: DC | PRN

## 2012-05-23 NOTE — Progress Notes (Signed)
Patient ID: Wendy [name], female   DOB: 13-Mar-1961, 51 y.o.   MRN: [id number] Patient ID: Wendy [name]    DOB: 04-18-1961, 51 y.o.   MRN: [id number] --- Subjective:  Wendy [name] is a 51 y.o.female who presents for follow up on right hip pain. Patient has been seen by Dr. Dion Saucier with orthopedics, who doesn't think this is necessarily related to her hip. Right hip injection had not significantly reduced the pain. It appears that a second opinion at [facility] also confirms Dr. 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 Dr. Dion Saucier had recommended that patient be seen for possible GYN etiology.    Right hip Pain: occurs in the groin area and hip, feels like muscle twisting feeling in inner thigh (groin) and lateral hip. Trouble getting out of bed from pain. 9/10. Constant. Severity comes and goes. Worst at night. Able to walk some with the cane up the hall and back. Grocery shopping is difficult. Worst with sitting for a long time.   Had physical therapy but could not continue because of increased pain. She thinks physical therapy might have helped a little bit. She denies any leg weakness or numbness.   Gyn History:  No h/o abnormal PAP, no h/o STD's. No pelvic surgery. No gyn cancers in the family.  2 vaginal births. No history of pelvic surgery. Last intercourse: 10 or so years ago  ROS: see HPI Past Medical History: reviewed and updated medications and allergies. Social History: Tobacco: denies   Objective: Filed Vitals:   05/23/12 1601  BP: 114/76  Pulse: 89  Temp: 98.2 F (36.8 C)    Physical Examination:   General appearance - alert, well appearing, and in no distress Abdomen - soft, nontender, nondistended, no masses or organomegaly Extremities - peripheral pulses normal, no pedal edema, no clubbing or cyanosis Right hip - mild tenderness to palpation along right greater trochanter. 4+/5 strength in hip flexion bilaterally, 4+/5  strength knee extension and flexion bilaterally. Negative straight leg.  Pelvic - normal appearing external genitalia,   Pelvic exam: VULVA: normal appearing vulva with no masses, tenderness or lesions, VAGINA: atrophic, mild white discharge present, CERVIX: normal appearing cervix without discharge, UTERUS: uterus is normal size, shape, consistency and nontender, ADNEXA: adnexa difficult to palpate secondary to patient's habitus, no cervical motion tenderness.

## 2012-05-23 NOTE — Patient Instructions (Signed)
It is ok for you take the volteran gel

## 2012-05-24 ENCOUNTER — Telehealth: Payer: Self-pay | Admitting: Family Medicine

## 2012-05-24 NOTE — Assessment & Plan Note (Signed)
Etiology of right hip unclear at the time. Patient has been seen by Dr. Dion Saucier with orthopedics, who doesn't think this is necessarily related to her hip. Right hip injection had not significantly reduced the pain. It appears that a second opinion at [facility] also confirms Dr. 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 Dr. Dion Saucier had recommended that patient be seen for possible GYN etiology.  Pelvic exam was unremarkable. Patient did not show any discomfort with pelvic and abdominal exams. Follow up on PAP and wet prep. Would consider pelvic US to rule out any possible uterine etiology that was missed on physical exam.  Will also refer patient back to [facility] for re-evaluation and management of her pain. Also encouraged patient to continue going to PT. Refilled oxycodone for acute pain, volteran gel (OK on creatinine given local action) and robaxin. PAtient knows to come back in 1 month for refills.

## 2012-05-24 NOTE — Telephone Encounter (Signed)
Returned call to patient.  Patient has some questions about yesterday's visit:  1.  Wants to know what exam showed 2.  Need U/S or GYN referral? 3.  Labs needed? 4.  [employer] paperwork for back brace 5.  Pain clinic referral?  Informed patient that Dr. Gwenlyn Saran will be in office tomorrow.  Message routed to Dr. Gwenlyn Saran.  Gaylene Brooks, RN

## 2012-05-24 NOTE — Telephone Encounter (Signed)
Pt is asking to speak to nurse about her visit yesterday - has several questions

## 2012-05-25 ENCOUNTER — Telehealth: Payer: Self-pay | Admitting: Family Medicine

## 2012-05-25 DIAGNOSIS — E669 Obesity, unspecified: Secondary | ICD-10-CM

## 2012-05-25 NOTE — Telephone Encounter (Signed)
Calling back about the following and talked directly with patient:  1. Wants to know what exam showed:  - reviewed normal wet prep results 2. Need U/S or GYN referral?  - will hold on gyn referral for now. Since I could not fully assess any ovarian enlargement on exam due to body habitus, will get transvaginal and pelvic US.  3. Labs needed? - will order fasting lipid, CBC and CMP  4. [employer] paperwork for back brace: - will not send for back brace because I would like for her to strengthen her core muscles and work with physical therapy. She says that she will be seeing PT next week.  5. Pain clinic referral? - sent in referral with Dr. Tyron Russell

## 2012-05-27 DIAGNOSIS — I10 Essential (primary) hypertension: Secondary | ICD-10-CM | POA: Diagnosis not present

## 2012-05-27 DIAGNOSIS — K7460 Unspecified cirrhosis of liver: Secondary | ICD-10-CM | POA: Diagnosis not present

## 2012-05-27 DIAGNOSIS — IMO0001 Reserved for inherently not codable concepts without codable children: Secondary | ICD-10-CM | POA: Diagnosis not present

## 2012-05-27 DIAGNOSIS — M25559 Pain in unspecified hip: Secondary | ICD-10-CM | POA: Diagnosis not present

## 2012-05-27 DIAGNOSIS — M545 Low back pain: Secondary | ICD-10-CM | POA: Diagnosis not present

## 2012-05-27 LAB — POCT URINALYSIS DIPSTICK
Bilirubin, UA: NEGATIVE
Leukocytes, UA: NEGATIVE
Nitrite, UA: NEGATIVE
pH, UA: 7

## 2012-05-27 NOTE — Addendum Note (Signed)
Addended by: Jennette Bill on: 05/27/2012 12:05 PM   Modules accepted: Orders

## 2012-05-29 ENCOUNTER — Other Ambulatory Visit: Payer: Self-pay | Admitting: Family Medicine

## 2012-05-31 ENCOUNTER — Encounter: Payer: Self-pay | Admitting: Family Medicine

## 2012-06-01 ENCOUNTER — Ambulatory Visit (HOSPITAL_COMMUNITY): Admission: RE | Admit: 2012-06-01 | Payer: Medicare Other | Source: Ambulatory Visit

## 2012-06-01 ENCOUNTER — Telehealth: Payer: Self-pay | Admitting: Family Medicine

## 2012-06-01 NOTE — Telephone Encounter (Signed)
Called patient to let her know that she can make the appointment with Dr. Ronney Lion on her own. Also let her know that her pap smear and ua were normal.

## 2012-06-02 DIAGNOSIS — M545 Low back pain: Secondary | ICD-10-CM | POA: Diagnosis not present

## 2012-06-02 DIAGNOSIS — M25559 Pain in unspecified hip: Secondary | ICD-10-CM | POA: Diagnosis not present

## 2012-06-02 DIAGNOSIS — IMO0001 Reserved for inherently not codable concepts without codable children: Secondary | ICD-10-CM | POA: Diagnosis not present

## 2012-06-02 DIAGNOSIS — K7460 Unspecified cirrhosis of liver: Secondary | ICD-10-CM | POA: Diagnosis not present

## 2012-06-02 DIAGNOSIS — I10 Essential (primary) hypertension: Secondary | ICD-10-CM | POA: Diagnosis not present

## 2012-06-03 ENCOUNTER — Ambulatory Visit (HOSPITAL_COMMUNITY)
Admission: RE | Admit: 2012-06-03 | Discharge: 2012-06-03 | Disposition: A | Discharge status: Home / Self Care | Payer: Medicare Other | Source: Ambulatory Visit | Attending: Family Medicine | Admitting: Family Medicine

## 2012-06-03 DIAGNOSIS — R109 Unspecified abdominal pain: Secondary | ICD-10-CM | POA: Insufficient documentation

## 2012-06-03 DIAGNOSIS — N949 Unspecified condition associated with female genital organs and menstrual cycle: Secondary | ICD-10-CM | POA: Diagnosis not present

## 2012-06-03 DIAGNOSIS — E669 Obesity, unspecified: Secondary | ICD-10-CM

## 2012-06-08 ENCOUNTER — Telehealth: Payer: Self-pay | Admitting: Family Medicine

## 2012-06-08 NOTE — Telephone Encounter (Signed)
Patient is calling because [facility] [address] did not receive the refill on her Methocarbamol.  Also, she would like the results from her test from her last appt.

## 2012-06-08 NOTE — Telephone Encounter (Signed)
Called and gave normal results of [place] to patient.Busick, [name]

## 2012-06-09 ENCOUNTER — Telehealth: Payer: Self-pay | Admitting: *Deleted

## 2012-06-09 NOTE — Telephone Encounter (Signed)
PA required for xifaxan. Form placed in MD box.

## 2012-06-09 NOTE — Telephone Encounter (Signed)
Called pt and informed of normal [place] results. Lorenda Hatchet, Renato Battles

## 2012-06-10 ENCOUNTER — Telehealth: Payer: Self-pay | Admitting: Family Medicine

## 2012-06-10 NOTE — Telephone Encounter (Signed)
They are calling for her to be referred to another place - they will be faxing referral (it's pretty long) today

## 2012-06-10 NOTE — Telephone Encounter (Signed)
Approval received from insurance company. Pharmacy notified. 

## 2012-06-12 ENCOUNTER — Other Ambulatory Visit: Payer: Self-pay | Admitting: Family Medicine

## 2012-06-13 ENCOUNTER — Telehealth: Payer: Self-pay | Admitting: *Deleted

## 2012-06-13 NOTE — Telephone Encounter (Signed)
[facility] from [facility] called. Atarax (Hydroxyzine) is not covered under pt's insurance plan anymore. [facility] request for doctor to call for PA [phone]. Will fwd. To PCP. Marland KitchenArlyss Repress

## 2012-06-15 ENCOUNTER — Telehealth: Payer: Self-pay | Admitting: *Deleted

## 2012-06-15 ENCOUNTER — Ambulatory Visit (INDEPENDENT_AMBULATORY_CARE_PROVIDER_SITE_OTHER): Payer: Medicare Other | Admitting: Family Medicine

## 2012-06-15 ENCOUNTER — Encounter: Payer: Self-pay | Admitting: Family Medicine

## 2012-06-15 VITALS — BP 116/78 | HR 80 | Ht 62.0 in | Wt 188.0 lb

## 2012-06-15 DIAGNOSIS — Z23 Encounter for immunization: Secondary | ICD-10-CM | POA: Diagnosis not present

## 2012-06-15 DIAGNOSIS — M549 Dorsalgia, unspecified: Secondary | ICD-10-CM

## 2012-06-15 DIAGNOSIS — F411 Generalized anxiety disorder: Secondary | ICD-10-CM | POA: Diagnosis not present

## 2012-06-15 DIAGNOSIS — M25551 Pain in right hip: Secondary | ICD-10-CM

## 2012-06-15 DIAGNOSIS — H1045 Other chronic allergic conjunctivitis: Secondary | ICD-10-CM

## 2012-06-15 DIAGNOSIS — F4001 Agoraphobia with panic disorder: Secondary | ICD-10-CM | POA: Diagnosis not present

## 2012-06-15 DIAGNOSIS — L738 Other specified follicular disorders: Secondary | ICD-10-CM

## 2012-06-15 DIAGNOSIS — M25559 Pain in unspecified hip: Secondary | ICD-10-CM | POA: Diagnosis not present

## 2012-06-15 DIAGNOSIS — L853 Xerosis cutis: Secondary | ICD-10-CM

## 2012-06-15 DIAGNOSIS — L989 Disorder of the skin and subcutaneous tissue, unspecified: Secondary | ICD-10-CM | POA: Diagnosis not present

## 2012-06-15 DIAGNOSIS — F419 Anxiety disorder, unspecified: Secondary | ICD-10-CM

## 2012-06-15 DIAGNOSIS — H1010 Acute atopic conjunctivitis, unspecified eye: Secondary | ICD-10-CM

## 2012-06-15 MED ORDER — CLONAZEPAM 1 MG PO TABS: 1.0000 mg | ORAL_TABLET | Freq: Three times a day (TID) | ORAL | Status: DC | PRN

## 2012-06-15 MED ORDER — OXYCODONE HCL 5 MG PO TABS: 10.0000 mg | ORAL_TABLET | Freq: Three times a day (TID) | ORAL | Status: DC | PRN

## 2012-06-15 MED ORDER — OLOPATADINE HCL 0.2 % OP SOLN: 1.0000 [drp] | Freq: Every day | OPHTHALMIC | Status: DC

## 2012-06-15 NOTE — Telephone Encounter (Signed)
[facility] calling to request refill of Klonopin 1mg  #90---last refilled on 05/23/12.  Will route request to Dr. Gwenlyn Saran.  Gaylene Brooks, RN

## 2012-06-16 NOTE — Progress Notes (Signed)
Patient ID: Wendy [name]    DOB: 02/03/1961, 51 y.o.   MRN: [id number] --- Subjective:  Wendy [name] is a 51 y.o.female who presents for follow up on HTN and right hip/groin pain. She has the following concerns: - left eye, clear film that occurs mostly in the morning, has been present for weeks, not associated with change in vision, no pain, some itching. She has been using clear eyes and saline which has not really helped.  - right hip pain: 6/10 with pain medicine. Pain is the same in nature: located in the groin, deep pain, not worst with palpation. No leg weakness, no loss of sensation, no tingling. She has been to physical therapy which has helped some but sessions are now over. She has exercises at home that she is trying to do.  - skin spot on right eye: present for several months, has not changed in color, size or shape. She states that she had a basal cell carcinoma removed but cannot remember where it was located. She is concerned that this is a new basal cell. No itching, no pain.    ROS: see HPI Past Medical History: reviewed and updated medications and allergies. Social History: Tobacco: denies  Objective: Filed Vitals:   06/15/12 1349  BP: 116/78  Pulse: 80    Physical Examination:   General appearance - alert, well appearing, and in no distress Eyes - no erythema, no exudate or drainage bilaterally Skin - right eyelid: 0.5cm diameter dry plaque, similar in color to patient's skin, along eye crease Chest - clear to auscultation, no wheezes, rales or rhonchi, symmetric air entry Heart - normal rate, regular rhythm, normal S1, S2, no murmurs, rubs, clicks or gallops Abdomen - soft, nontender, nondistended, no masses or organomegaly Extremities - peripheral pulses normal, no pedal edema, minimal tenderness to palpation along greater trochanter, 4+/5 strength in lower extremities bilaterally.

## 2012-06-19 DIAGNOSIS — L989 Disorder of the skin and subcutaneous tissue, unspecified: Secondary | ICD-10-CM | POA: Insufficient documentation

## 2012-06-19 DIAGNOSIS — H1010 Acute atopic conjunctivitis, unspecified eye: Secondary | ICD-10-CM | POA: Insufficient documentation

## 2012-06-19 DIAGNOSIS — L853 Xerosis cutis: Secondary | ICD-10-CM | POA: Insufficient documentation

## 2012-06-19 NOTE — Assessment & Plan Note (Signed)
No evidence of bacterial infection, no evidence of change in vision. Symptoms likely from allergic conjunctivitis. Will prescribe trial of pataday eye drops.

## 2012-06-19 NOTE — Assessment & Plan Note (Signed)
Has had further evaluation to rule out GYN etiology: pelvic US did not show any acute abnormality. Patient's orthopedist Dr. Dion Saucier would like referral sent to Dr. Turner Daniels at [facility] for consideration of conversion of hemiarthoscopy of total hip. Will send referral. In the meantime, refilled oxycodone. Patient to return in 1 month.

## 2012-06-19 NOTE — Assessment & Plan Note (Signed)
Refilled klonopin. Will work on weaning dose.

## 2012-06-19 NOTE — Assessment & Plan Note (Signed)
Appears benign on exam: likely dry skin. No evidence of discoloration, ulceration, change in shape or contour. Will continue to observe. Patient knows to return for evaluation if change in shape, color, size. 0.5cm in diameter today. Will measure at next visit to monitor size.

## 2012-06-20 ENCOUNTER — Telehealth: Payer: Self-pay | Admitting: *Deleted

## 2012-06-20 DIAGNOSIS — K7290 Hepatic failure, unspecified without coma: Secondary | ICD-10-CM | POA: Diagnosis not present

## 2012-06-20 DIAGNOSIS — M549 Dorsalgia, unspecified: Secondary | ICD-10-CM | POA: Diagnosis not present

## 2012-06-20 DIAGNOSIS — R945 Abnormal results of liver function studies: Secondary | ICD-10-CM | POA: Diagnosis not present

## 2012-06-20 DIAGNOSIS — B182 Chronic viral hepatitis C: Secondary | ICD-10-CM | POA: Diagnosis not present

## 2012-06-20 DIAGNOSIS — I10 Essential (primary) hypertension: Secondary | ICD-10-CM | POA: Diagnosis not present

## 2012-06-20 DIAGNOSIS — K766 Portal hypertension: Secondary | ICD-10-CM | POA: Diagnosis not present

## 2012-06-20 DIAGNOSIS — M25559 Pain in unspecified hip: Secondary | ICD-10-CM | POA: Diagnosis not present

## 2012-06-20 DIAGNOSIS — G8929 Other chronic pain: Secondary | ICD-10-CM | POA: Diagnosis not present

## 2012-06-20 DIAGNOSIS — K769 Liver disease, unspecified: Secondary | ICD-10-CM | POA: Diagnosis not present

## 2012-06-20 DIAGNOSIS — M810 Age-related osteoporosis without current pathological fracture: Secondary | ICD-10-CM | POA: Diagnosis not present

## 2012-06-20 DIAGNOSIS — K7460 Unspecified cirrhosis of liver: Secondary | ICD-10-CM | POA: Diagnosis not present

## 2012-06-20 NOTE — Telephone Encounter (Signed)
Attempted to call patient but number listed is a fax number. Patient has an appointment scheduled with Dr frank Turner Daniels on 06/23/2012 at 11:00.Busick, [name]

## 2012-06-23 DIAGNOSIS — M25559 Pain in unspecified hip: Secondary | ICD-10-CM | POA: Diagnosis not present

## 2012-06-24 ENCOUNTER — Other Ambulatory Visit: Payer: Self-pay | Admitting: Family Medicine

## 2012-06-27 ENCOUNTER — Encounter: Payer: Self-pay | Admitting: Internal Medicine

## 2012-06-27 ENCOUNTER — Ambulatory Visit (INDEPENDENT_AMBULATORY_CARE_PROVIDER_SITE_OTHER): Payer: Medicare Other | Admitting: Internal Medicine

## 2012-06-27 VITALS — BP 100/60 | HR 64 | Ht 62.0 in | Wt 193.0 lb

## 2012-06-27 DIAGNOSIS — B182 Chronic viral hepatitis C: Secondary | ICD-10-CM

## 2012-06-27 DIAGNOSIS — Z1211 Encounter for screening for malignant neoplasm of colon: Secondary | ICD-10-CM | POA: Diagnosis not present

## 2012-06-27 DIAGNOSIS — K7460 Unspecified cirrhosis of liver: Secondary | ICD-10-CM | POA: Diagnosis not present

## 2012-06-27 NOTE — Progress Notes (Signed)
Subjective:    Patient ID: Wendy [name], female    DOB: 05/05/1961, 51 y.o.   MRN: [id number]  HPI Patient returns today with her daughter, for followup. She has hepatitis C and cirrhosis and is followed at [facility]. She has had stable disease and a low MELD more. She was to have a screening colonoscopy last spring but was unable to schedule that is here to do that today. Her main problems are a lot of right hip pain. She is status post prior surgery in this area but is having persistent problems and was referred up to [facility] by her local orthopedist as well. She is waiting to hear from her liver specialist about whether or not she could have the surgery or least be cleared for that from a liver standpoint I suppose. She tells me that Dr. Dion Saucier, her local orthopedist, has wondered if the diverticulosis she has as seen on previous CT could be related to her pain. Her pain is really in the right groin area and in her thigh. It is not abdominal. Allergies  Allergen Reactions  . Codeine Phosphate Itching    REACTION: unspecified   Outpatient Prescriptions Prior to Visit  Medication Sig Dispense Refill  . alendronate (FOSAMAX) 70 MG tablet Take 70 mg by mouth every 7 (seven) days. Take with a full glass of water on an empty stomach.      . calcium-vitamin D (OSCAL-500) 500-400 MG-UNIT per tablet 2 tablets 2 (two) times daily.       . carvedilol (COREG) 12.5 MG tablet Take 1 tablet (12.5 mg total) by mouth 2 (two) times daily with a meal.  60 tablet  3  . clonazePAM (KLONOPIN) 1 MG tablet Take 1 tablet (1 mg total) by mouth 3 (three) times daily as needed.  90 tablet  0  . hydrOXYzine (ATARAX/VISTARIL) 25 MG tablet TAKE 1 TABLET BY MOUTH 4 TIMES A DAY  120 tablet  3  . lactulose (CHRONULAC) 10 GM/15ML solution Take 20 g by mouth 3 (three) times daily. Cannot remember dose managed by gastroenterology       . loratadine (CLARITIN) 10 MG tablet Take 1 tablet (10 mg total) by mouth daily.  30 tablet  11  .  methocarbamol (ROBAXIN) 500 MG tablet Take 1 tablet (500 mg total) by mouth 3 (three) times daily as needed.  1 tablet  0  . methocarbamol (ROBAXIN) 500 MG tablet TAKE 1 TABLET BY MOUTH 4 TIMES A DAY  30 tablet  0  . Olopatadine HCl (PATADAY) 0.2 % SOLN Apply 1 drop to eye daily.  1 Bottle  5  . omeprazole (PRILOSEC) 20 MG capsule Take 1 capsule (20 mg total) by mouth daily. Take 30-60 minutes before breakfast  30 capsule  11  . peg 3350 powder (MOVIPREP) SOLR Take 1 kit (100 g total) by mouth once.  1 kit  0  . Prenatal Vit-Fe Fumarate-FA (PRENATAL FORMULA) 28-0.8 MG TABS Take 1 tablet by mouth daily.        . rifaximin (XIFAXAN) 550 MG TABS Take 1 tablet (550 mg total) by mouth 2 (two) times daily.  60 tablet  11  . sertraline (ZOLOFT) 100 MG tablet Take 1 tablet (100 mg total) by mouth daily.  30 tablet  3  . spironolactone (ALDACTONE) 25 MG tablet TAKE 1 TABLET BY MOUTH EVERY DAY  30 tablet  3  . clonazePAM (KLONOPIN) 0.25 MG disintegrating tablet Take 1-2 tablets (0.25-0.5 mg total) by mouth 3 (three)  times daily as needed.  60 tablet  0  . clonazePAM (KLONOPIN) 0.5 MG tablet Take 1 tablet (0.5 mg total) by mouth 3 (three) times daily as needed for anxiety.  90 tablet  1  . oxyCODONE (ROXICODONE) 5 MG immediate release tablet Take 2 tablets (10 mg total) by mouth every 8 (eight) hours as needed for pain.  90 tablet  0   Last reviewed on 06/27/2012  2:43 PM by Iva Boop, MD Past Medical History  Diagnosis Date  . Cirrhosis   . Hiatal hernia   . Esophageal stricture   . Anxiety disorder   . 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 Hepatitis C, chronic   . History of alcohol abuse   . Depression   . History of hip fracture   . Allergic rhinitis   . HTN (hypertension)   . Anxiety   . Blood transfusion   . Cataract     MILD  . GERD (gastroesophageal reflux disease)   . Osteoporosis   . Ulcer    Past Surgical History  Procedure Date  . Orif hip fracture  2010    right  . Upper gastrointestinal endoscopy 08/05/2007    esophageal ring, hiatal hernia, portal gastropathy  . Splenectomy     age 76  . Burr hole for subdural hematoma   . Upper gastrointestinal endoscopy 10/14/2011   History   Social History  . Marital Status: Widowed    Spouse Name: N/A    Number of Children: N/A  . Years of Education: N/A   Social History Main Topics  . Smoking status: Former Smoker -- 0.5 packs/day    Types: Cigarettes    Quit date: 12/23/2008  . Smokeless tobacco: Never Used  . Alcohol Use: No     Comment: goes to AA  . Drug Use: No  . Sexually Active: No   Other Topics Concern  . None   Social History Narrative   Just before her husband died, she had a miscarriage and started to drink beer heavily.  She quit secondary to her liver disease and has been quit for several years.  She denies the use of drugs - prescription or otherwise.     Family History  Problem Relation Age of Onset  . Heart disease Mother   . Colon cancer Neg Hx   . Other Daughter     chromosome abnormalit  . Other Daughter     micropthalmia    Review of Systems As above.    Objective:   Physical Exam General:  NAD Eyes:   anicteric Lungs:  clear Heart:  S1S2 no rubs, murmurs or gallops Abdomen:  soft and nontender, BS+ Ext:   no edema     Assessment & Plan:   1. Cirrhosis of liver without mention of alcohol   2. Chronic hepatitis C   3. Special screening for malignant neoplasms, colon    1. Numbers 1 and 2 are stable and followed by [facility] liver clinic. 2. Go ahead and schedule a screening colonoscopy The risks and benefits as well as alternatives of endoscopic procedure(s) have been discussed and reviewed. All questions answered. The patient agrees to proceed. I do not think diverticulosis is causing any of her right groin symptoms. I suspect that is from her hip.  CC: Marena Chancy, MD and Teryl Lucy, MD

## 2012-06-27 NOTE — Patient Instructions (Addendum)
You have been scheduled for a colonoscopy with propofol. Please follow written instructions given to you at your visit today.  Please use the moviprep kit you have at home to prep. If you use inhalers (even only as needed) or a CPAP machine, please bring them with you on the day of your procedure.  Thank you for choosing me and [facility].  Iva Boop, M.D., [facility]

## 2012-06-28 ENCOUNTER — Other Ambulatory Visit: Payer: Self-pay | Admitting: *Deleted

## 2012-06-28 ENCOUNTER — Ambulatory Visit: Payer: Medicare Other | Admitting: Physical Medicine & Rehabilitation

## 2012-06-30 ENCOUNTER — Telehealth: Payer: Self-pay

## 2012-06-30 ENCOUNTER — Encounter: Payer: Medicare Other | Admitting: Internal Medicine

## 2012-06-30 NOTE — Telephone Encounter (Signed)
Patient is calling about her appt.

## 2012-07-01 ENCOUNTER — Encounter: Payer: Medicare Other | Attending: Physical Medicine & Rehabilitation

## 2012-07-01 ENCOUNTER — Ambulatory Visit: Payer: Medicare Other | Admitting: Physical Medicine & Rehabilitation

## 2012-07-01 ENCOUNTER — Other Ambulatory Visit: Payer: Self-pay

## 2012-07-01 DIAGNOSIS — K219 Gastro-esophageal reflux disease without esophagitis: Secondary | ICD-10-CM

## 2012-07-01 DIAGNOSIS — R1310 Dysphagia, unspecified: Secondary | ICD-10-CM

## 2012-07-01 MED ORDER — OMEPRAZOLE 20 MG PO CPDR: 20.0000 mg | DELAYED_RELEASE_CAPSULE | Freq: Every day | ORAL | Status: DC

## 2012-07-01 NOTE — Telephone Encounter (Signed)
Sent omeprazole refill to [facility].

## 2012-07-05 ENCOUNTER — Encounter: Payer: Self-pay | Admitting: Physical Medicine and Rehabilitation

## 2012-07-05 ENCOUNTER — Ambulatory Visit: Payer: Medicare Other | Admitting: Physical Medicine & Rehabilitation

## 2012-07-05 ENCOUNTER — Encounter
Payer: Medicare Other | Attending: Physical Medicine and Rehabilitation | Admitting: Physical Medicine and Rehabilitation

## 2012-07-05 VITALS — BP 113/56 | HR 71 | Resp 14 | Ht 62.0 in | Wt 194.0 lb

## 2012-07-05 DIAGNOSIS — M47817 Spondylosis without myelopathy or radiculopathy, lumbosacral region: Secondary | ICD-10-CM | POA: Insufficient documentation

## 2012-07-05 DIAGNOSIS — K219 Gastro-esophageal reflux disease without esophagitis: Secondary | ICD-10-CM | POA: Insufficient documentation

## 2012-07-05 DIAGNOSIS — M545 Low back pain, unspecified: Secondary | ICD-10-CM | POA: Insufficient documentation

## 2012-07-05 DIAGNOSIS — I10 Essential (primary) hypertension: Secondary | ICD-10-CM | POA: Diagnosis not present

## 2012-07-05 DIAGNOSIS — G8929 Other chronic pain: Secondary | ICD-10-CM | POA: Insufficient documentation

## 2012-07-05 DIAGNOSIS — M810 Age-related osteoporosis without current pathological fracture: Secondary | ICD-10-CM | POA: Diagnosis not present

## 2012-07-05 NOTE — Patient Instructions (Addendum)
Continue with exercises as pain allows. You could try Arnica cream for joint and muscle cream.

## 2012-07-05 NOTE — Progress Notes (Signed)
Subjective:    Patient ID: Wendy [name], female    DOB: 1960-11-03, 51 y.o.   MRN: [id number]  HPI The patient complains about chronic low back pain which radiates into her sacrum. The patient denies any further radiation.  The problem has gotten a little worse.  Pain Inventory Average Pain 8 Pain Right Now 8 My pain is sharp, dull, stabbing and aching  In the last 24 hours, has pain interfered with the following? General activity 10 Relation with others 8 Enjoyment of life 10 What TIME of day is your pain at its worst? all of the time Sleep (in general) Fair  Pain is worse with: walking, bending, sitting, standing and some activites Pain improves with: rest, heat/ice, pacing activities and medication Relief from Meds: 5  Mobility walk with assistance ability to climb steps?  no do you drive?  no  Function I need assistance with the following:  dressing, bathing, meal prep, household duties and shopping  Neuro/Psych bladder control problems weakness numbness tingling trouble walking spasms dizziness confusion depression anxiety  Prior Studies Any changes since last visit?  no  Physicians involved in your care Any changes since last visit?  no   Family History  Problem Relation Age of Onset  . Heart disease Mother   . Colon cancer Neg Hx   . Other Daughter     chromosome abnormalit  . Other Daughter     micropthalmia   History   Social History  . Marital Status: Widowed    Spouse Name: N/A    Number of Children: N/A  . Years of Education: N/A   Social History Main Topics  . Smoking status: Former Smoker -- 0.5 packs/day    Types: Cigarettes    Quit date: 12/23/2008  . Smokeless tobacco: Never Used  . Alcohol Use: No     Comment: goes to AA  . Drug Use: No  . Sexually Active: No   Other Topics Concern  . None   Social History Narrative   Just before her husband died, she had a miscarriage and started to drink beer heavily.  She quit  secondary to her liver disease and has been quit for several years.  She denies the use of drugs - prescription or otherwise.     Past Surgical History  Procedure Date  . Orif hip fracture 2010    right  . Upper gastrointestinal endoscopy 08/05/2007    esophageal ring, hiatal hernia, portal gastropathy  . Splenectomy     age 7  . Burr hole for subdural hematoma   . Upper gastrointestinal endoscopy 10/14/2011   Past Medical History  Diagnosis Date  . Cirrhosis   . Hiatal hernia   . Esophageal stricture   . Anxiety disorder   . 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 Hepatitis C, chronic   . History of alcohol abuse   . Depression   . History of hip fracture   . Allergic rhinitis   . HTN (hypertension)   . Anxiety   . Blood transfusion   . Cataract     MILD  . GERD (gastroesophageal reflux disease)   . Osteoporosis   . Ulcer    BP 113/56  Pulse 71  Resp 14  Ht 5\' 2"  (1.575 m)  Wt 194 lb (87.998 kg)  BMI 35.48 kg/m2  SpO2 94%    Review of Systems  Constitutional: Positive for diaphoresis and unexpected weight change.  Gastrointestinal: Positive for  abdominal pain.  Genitourinary:       Bladder control issues  Musculoskeletal: Positive for back pain and gait problem.       Spasms  Neurological: Positive for weakness and numbness.       Tingling  Hematological: Bruises/bleeds easily.  Psychiatric/Behavioral: Positive for confusion and dysphoric mood. The patient is nervous/anxious.   All other systems reviewed and are negative.       Objective:   Physical Exam  Constitutional: She is oriented to person, place, and time. She appears well-developed and well-nourished.       obese  HENT:  Head: Normocephalic.  Neck: Neck supple.  Musculoskeletal: She exhibits tenderness.  Neurological: She is alert and oriented to person, place, and time.  Skin: Skin is warm and dry.  Psychiatric: She has a normal mood and affect.    Symmetric  normal motor tone is noted throughout. Normal muscle bulk. Muscle testing reveals 5/5 muscle strength of the upper extremity, and 5/5 of the lower extremity. Full range of motion in upper and lower extremities. ROM of spine is restricted. Fine motor movements are normal in both hands. Sensory is intact and symmetric to light touch, pinprick and proprioception. DTR in the upper and lower extremity are present and symmetric 3+. No clonus is noted.  Patient arises from chair with difficulty. Wide based gait with normal arm swing bilateral , able to stand on heels and toes .    MRI of L-spine did not show any significant acute findings, image of right hip, did not show any new significant findings.      Assessment & Plan:  1. Lumbar spondylosis which responded for one to 2 days to medial branch blocks at L3-L4-L5 .  Ordered aquatic PT,consider repeating  injections and if we did a similar response of greater than 50% relief, we will then schedule for lumbar radiofrequency procedure, if patient does not get relief from PT.  Questions were answered. Follow up in one month.

## 2012-07-07 ENCOUNTER — Encounter: Payer: Medicare Other | Admitting: Internal Medicine

## 2012-07-07 ENCOUNTER — Ambulatory Visit: Payer: Medicare Other | Admitting: Family Medicine

## 2012-07-08 ENCOUNTER — Ambulatory Visit (INDEPENDENT_AMBULATORY_CARE_PROVIDER_SITE_OTHER): Payer: Medicare Other | Admitting: Family Medicine

## 2012-07-08 VITALS — BP 144/101 | HR 84 | Temp 98.8°F | Ht 62.0 in | Wt 189.0 lb

## 2012-07-08 DIAGNOSIS — E669 Obesity, unspecified: Secondary | ICD-10-CM | POA: Diagnosis not present

## 2012-07-08 DIAGNOSIS — M25559 Pain in unspecified hip: Secondary | ICD-10-CM | POA: Diagnosis not present

## 2012-07-08 DIAGNOSIS — M549 Dorsalgia, unspecified: Secondary | ICD-10-CM | POA: Diagnosis not present

## 2012-07-08 DIAGNOSIS — M25551 Pain in right hip: Secondary | ICD-10-CM

## 2012-07-08 LAB — LIPID PANEL
HDL: 12 mg/dL — ABNORMAL LOW (ref >39)
LDL Cholesterol: 74 mg/dL (ref 0–99)
Total CHOL/HDL Ratio: 9.2 Ratio
Triglycerides: 121 mg/dL (ref <150)

## 2012-07-08 LAB — CBC WITH DIFFERENTIAL/PLATELET
Basophils Relative: 1 % (ref 0–1)
HCT: 41.3 % (ref 36.0–46.0)
Hemoglobin: 14.4 g/dL (ref 12.0–15.0)
Lymphs Abs: 3.5 10*3/uL (ref 0.7–4.0)
MCH: 38.6 pg — ABNORMAL HIGH (ref 26.0–34.0)
MCHC: 34.9 g/dL (ref 30.0–36.0)
Monocytes Absolute: 1.1 10*3/uL — ABNORMAL HIGH (ref 0.1–1.0)
Monocytes Relative: 14 % — ABNORMAL HIGH (ref 3–12)
Neutro Abs: 3.4 10*3/uL (ref 1.7–7.7)
Neutrophils Relative %: 40 % — ABNORMAL LOW (ref 43–77)
RBC: 3.73 MIL/uL — ABNORMAL LOW (ref 3.87–5.11)

## 2012-07-08 MED ORDER — OXYCODONE HCL 5 MG PO TABS: 10.0000 mg | ORAL_TABLET | Freq: Three times a day (TID) | ORAL | Status: DC | PRN

## 2012-07-08 NOTE — Progress Notes (Signed)
  Subjective:    Patient ID: Wendy [name], female    DOB: 22-Jul-1961, 51 y.o.   MRN: [id number]  HPI  Patient presents to same day appointment for medication refill.  She has run out of Oxycodone.  Patient says she takes 2 tablets three times per day.  Last Rx she was given 90 tablets which was not enough to last for one month.  Patient has a hx of chronic hip pain and back pain.  Plan was to be seen at [facility].  She states that surgeon told her she was a poor candidate for surgery.  Patient says pain is unchanged.  Worse with ambulation.  Denies any numbness/tingling or extremities or loss of bladder or bowel function.  Addendum: It appears that patient was seen at Pain Clinic, but I was not aware of this prior to the appointment.  It will be up to the discretion of PCP if she wants to continue prescribing narcotics or will defer to Pain Clinic.  Review of Systems Per HPI    Objective:   Physical Exam  Constitutional: She appears well-nourished. No distress.  Musculoskeletal:       Patient walks slowly, wide-stance, and limps; she is unable to flex or bend over to touch toes; non-tender on palpation of lumbar spine or paraspinal muscles; tenderness on palpation of bilateral hip bone; no skin changes          Assessment & Plan:

## 2012-07-08 NOTE — Assessment & Plan Note (Signed)
Patient ran out of Oxycodone.  Last Rx was for #90 tablets.  Patient has been taking 2 tablets every 8 hours for chronic pain.  Discussed with patient that I can give her enough tablets to last until next appointment with PCP.  Patient understood. - Gave 5 day supply (30 tablets) today - Next appointment with PCP on Wednesday

## 2012-07-09 LAB — COMPLETE METABOLIC PANEL WITH GFR
ALT: 54 U/L — ABNORMAL HIGH (ref 0–35)
Alkaline Phosphatase: 144 U/L — ABNORMAL HIGH (ref 39–117)
Creat: 0.64 mg/dL (ref 0.50–1.10)
GFR, Est African American: >89 mL/min
GFR, Est Non African American: >89 mL/min
Sodium: 140 mEq/L (ref 135–145)
Total Bilirubin: 0.9 mg/dL (ref 0.3–1.2)
Total Protein: 6.6 g/dL (ref 6.0–8.3)

## 2012-07-13 ENCOUNTER — Ambulatory Visit (INDEPENDENT_AMBULATORY_CARE_PROVIDER_SITE_OTHER): Payer: Medicare Other | Admitting: Family Medicine

## 2012-07-13 ENCOUNTER — Ambulatory Visit: Payer: Medicare Other | Admitting: Family Medicine

## 2012-07-13 ENCOUNTER — Encounter: Payer: Self-pay | Admitting: Family Medicine

## 2012-07-13 VITALS — BP 116/76 | HR 77 | Temp 98.7°F | Ht 62.0 in | Wt 192.0 lb

## 2012-07-13 DIAGNOSIS — F4001 Agoraphobia with panic disorder: Secondary | ICD-10-CM | POA: Diagnosis not present

## 2012-07-13 DIAGNOSIS — F411 Generalized anxiety disorder: Secondary | ICD-10-CM

## 2012-07-13 DIAGNOSIS — M549 Dorsalgia, unspecified: Secondary | ICD-10-CM | POA: Diagnosis not present

## 2012-07-13 DIAGNOSIS — M25559 Pain in unspecified hip: Secondary | ICD-10-CM | POA: Diagnosis not present

## 2012-07-13 DIAGNOSIS — F419 Anxiety disorder, unspecified: Secondary | ICD-10-CM

## 2012-07-13 DIAGNOSIS — M25551 Pain in right hip: Secondary | ICD-10-CM

## 2012-07-13 MED ORDER — OXYCODONE HCL 10 MG PO TABS: 10.0000 mg | ORAL_TABLET | Freq: Three times a day (TID) | ORAL | Status: DC | PRN

## 2012-07-13 MED ORDER — CLONAZEPAM 1 MG PO TABS: 1.0000 mg | ORAL_TABLET | Freq: Three times a day (TID) | ORAL | Status: DC | PRN

## 2012-07-13 NOTE — Progress Notes (Signed)
Patient ID: MATISEN SARVER    DOB: 08-12-1961, 51 y.o.   MRN: [id number] --- Subjective:  Jaquette is a 51 y.o.female who presents for follow up on hip pain and need for medication refills.  - right hip and groin pain: not candidate for revision of hip surgery based on second opinions. She has recently been seen at the pain clinic where it was considered to repeat injections to medial branch blocks at L3-L4-L5. Also possible consideration for lumbar radiofrequency procedure.  Patient states that she walked through [place] which was painful and very tirying and that she slept all through to the next day. She reports worsening pain with walking up flight of stairs.   ROS: see HPI Past Medical History: reviewed and updated medications and allergies. Social History: Tobacco: not current  Objective: Filed Vitals:   07/13/12 1538  BP: 116/76  Pulse: 77  Temp: 98.7 F (37.1 C)    Physical Examination:   General appearance - alert, well appearing, and in no distress Chest - clear to auscultation, no wheezes, rales or rhonchi, symmetric air entry Heart - normal rate, regular rhythm, normal S1, S2, no murmurs, rubs, clicks or gallops Abdomen - soft, nontender, nondistended, no masses or organomegaly Right hip - limited range of motion with hip internal and external rotation. Tenderness to palpation along right hip. 4/5 strength of hip flexion bilaterally.

## 2012-07-13 NOTE — Patient Instructions (Addendum)
Call the pain clinic and let them know that Su Monks referred you to aquatic therapy.   For the pain, continue with the physical therapy which should help with the pain.  For the pain medicine, the oxycodone, since you're taking 2 tablets of the 5mg , I'm just going to prescribe a 10mg  tablet, that way you only take one tablet three times a day.

## 2012-07-14 ENCOUNTER — Telehealth: Payer: Self-pay | Admitting: Internal Medicine

## 2012-07-14 ENCOUNTER — Encounter: Payer: Medicare Other | Admitting: Internal Medicine

## 2012-07-14 NOTE — Assessment & Plan Note (Signed)
Refilled clonazepam 1mg  tid/prn

## 2012-07-14 NOTE — Telephone Encounter (Signed)
We can schedule EGD dili in [facility] but she also needs screening colonoscopy

## 2012-07-14 NOTE — Assessment & Plan Note (Signed)
Patient requesting back brace for support. I do not have that paperwork anymore and patient will need to bring me another form.

## 2012-07-14 NOTE — Telephone Encounter (Signed)
Patient reports "stomach doesn't feel right".  She is also having dysphagia and feels that her food is sticking.  She had EGD with dil in 09/2011 and was found to have a stricture.  Dr. Leone Payor she is asking if she needs to increase her PPI to BID, or a repeat dil.  Please advise

## 2012-07-14 NOTE — Assessment & Plan Note (Addendum)
No plan for surgery at this point. Continue plans from pain clinic with aquatic PT and possibly injections and/or radiofrequency procedure.  Refilled oxycodone at 10mg  since patient was already taking 2 pills of 5mg  every 8 hours. Follow up in 1 month.

## 2012-07-15 NOTE — Telephone Encounter (Signed)
Patient advised of Dr. Jamelle Haring recommendations.  She is scheduled for endo colon 08/26/12 and pre-visit 08/22/12

## 2012-07-18 ENCOUNTER — Ambulatory Visit: Payer: Medicare Other | Admitting: Family Medicine

## 2012-07-18 ENCOUNTER — Telehealth: Payer: Self-pay | Admitting: Family Medicine

## 2012-07-18 ENCOUNTER — Telehealth: Payer: Self-pay | Admitting: *Deleted

## 2012-07-18 NOTE — Telephone Encounter (Signed)
I will follow up on the aquatic therapy with [facility], she can use Arnica cream OTC for muscle and joint pain, this does not have an effect on the kidneys.

## 2012-07-18 NOTE — Telephone Encounter (Signed)
Patient called stating she saw Clydie Braun about one week ago. She was going to refer her for water Therapy. Has not heard anything about this. Patient would also like to know if she can use Voltaren Gel. She has been told by one physician it is ok and No by another. They say it can affect her kidneys. Please advise.

## 2012-07-18 NOTE — Telephone Encounter (Signed)
Patient is scheduled to see Dr. Gwenlyn Saran on 12/17 and she wants to be sure that she can get the shot for her Bursitis.

## 2012-07-18 NOTE — Telephone Encounter (Signed)
Patient is calling because she needs help at her home for Physical Therapy and assistance with cleaning up.

## 2012-07-18 NOTE — Telephone Encounter (Signed)
Filled out [place] mobility and medical form for back brace.

## 2012-07-18 NOTE — Telephone Encounter (Signed)
PT tried to call her several times, Wendy [name] will call her again.

## 2012-07-25 ENCOUNTER — Other Ambulatory Visit: Payer: Self-pay | Admitting: *Deleted

## 2012-07-25 MED ORDER — METHOCARBAMOL 500 MG PO TABS: 500.0000 mg | ORAL_TABLET | Freq: Three times a day (TID) | ORAL | Status: DC

## 2012-07-26 ENCOUNTER — Telehealth: Payer: Self-pay | Admitting: Family Medicine

## 2012-07-26 MED ORDER — METHOCARBAMOL 500 MG PO TABS: 500.0000 mg | ORAL_TABLET | Freq: Three times a day (TID) | ORAL | Status: DC | PRN

## 2012-07-26 NOTE — Telephone Encounter (Signed)
Patient would like for Dr. Gwenlyn Saran to call her to discuss some errors in the way her meds are prescribed.  She is only getting a 10 day supply of Methocarbamol and she would like a 30 day or 90 day supply.  Also, she would like a referral to an Allergist.  She was very kind and did not want this to sound like a complaint, at any rate, this isn't the first time there has been an error and she feels like this is probably happening with other patients as well and she would like Dr. Gwenlyn Saran to have some extra guidance.  She did not want Dr. Gwenlyn Saran to get into trouble but since this is a teaching program, this could be a teachable issue.

## 2012-07-26 NOTE — Telephone Encounter (Signed)
(   see info for brace). Pt may need OV for allergist referral. Fwd. To PCP for review. Arlyss Repress

## 2012-07-26 NOTE — Telephone Encounter (Signed)
Patient is calling with the measurements for the back brace, 52, 12.  She is just making the office aware of her measurements, there isn't anything else that needs to be done by [facility], she just needs to send her measurements to the company that is going to make it.  She does still want a referral for an allergist.  She saw someone a couple of years ago, but doesn't remember who it was.

## 2012-07-26 NOTE — Telephone Encounter (Signed)
Refilling methocarbamol for 1 month supply.

## 2012-07-26 NOTE — Telephone Encounter (Signed)
Methocarbamol refilled yesterday for #30 tabs.  Patient usually takes TID and wanting 30 to 90 day supply.  Will route phone note to Dr. Gwenlyn Saran and call patient back.  Gaylene Brooks, RN

## 2012-07-27 ENCOUNTER — Telehealth: Payer: Self-pay | Admitting: Family Medicine

## 2012-07-27 NOTE — Telephone Encounter (Signed)
Forward to PCP to review labs.

## 2012-07-27 NOTE — Telephone Encounter (Signed)
Patient is calling for her lab results from 11/15.

## 2012-07-28 ENCOUNTER — Telehealth: Payer: Self-pay | Admitting: Family Medicine

## 2012-07-28 NOTE — Telephone Encounter (Signed)
Pt is asking to speak to Dr Gwenlyn Saran today.  States that it is urgent.  RE: referral

## 2012-07-28 NOTE — Telephone Encounter (Signed)
Called pt. Talked for 12 min with pt about her medication issue. Explained, that the robaxin was sent #90, as she requested. Also explained, that Dr.Losq gave her #30 before, because pt should have used it prn. Pt said, that she needs it tid and she hope, that it will be delivered today.  Pt's speech sounded very slurry. Pt had many complaints about referrals and appt's. I went over appt's with her: 12/10-13 with [facility], 08-09-12 with Losq, 08-22-12 and 08-26-12 with Leafy Ro). Pt said, that she was referred to [facility] to Orthopedic Doctor, but she wants to be referred to [facility]. She said, that she had requested this several times before and does not understand WHY it was not done. Her orthopedist recommended it. She also said, that she does not understand why her medications were not sent with the 'right instructions'. Again, I explained, that Robaxin was prescribed prn. Also, explained to the pt, that Dr.Losq is in clinic to see pt and is unable to discuss all these issues on the phone with her. She

## 2012-07-28 NOTE — Telephone Encounter (Signed)
Called pt. No connection. Will try later. See previous note from Dr.Losq .Arlyss Repress

## 2012-07-28 NOTE — Telephone Encounter (Signed)
Agreed and will keep appt with Dr.Losq. She also understands, that Dr.Losq is not able to call her. She thanked me for my help. I looked under referrals and it was stated that [facility] would contact the pt, if she is accepted as a pt for possible hip replacement. Lorenda Hatchet, Renato Battles

## 2012-08-02 ENCOUNTER — Encounter
Payer: Medicare Other | Attending: Physical Medicine and Rehabilitation | Admitting: Physical Medicine and Rehabilitation

## 2012-08-09 ENCOUNTER — Ambulatory Visit: Payer: Medicare Other | Admitting: Family Medicine

## 2012-08-15 NOTE — Telephone Encounter (Signed)
[facility] called to obtain NPI for a self referral - appt is 12/26 for 2nd opinion - npi was given

## 2012-08-19 ENCOUNTER — Telehealth: Payer: Self-pay | Admitting: *Deleted

## 2012-08-19 NOTE — Telephone Encounter (Signed)
Patient is having an increase in reflux, she is asked to increase her Prilosec to BID and keep the appt for 08/25/12

## 2012-08-22 ENCOUNTER — Ambulatory Visit (AMBULATORY_SURGERY_CENTER): Payer: Medicare Other | Admitting: *Deleted

## 2012-08-22 ENCOUNTER — Telehealth: Payer: Self-pay

## 2012-08-22 ENCOUNTER — Ambulatory Visit: Payer: Medicare Other | Admitting: Family Medicine

## 2012-08-22 ENCOUNTER — Telehealth: Payer: Self-pay | Admitting: Family Medicine

## 2012-08-22 VITALS — Ht 62.0 in | Wt 205.0 lb

## 2012-08-22 DIAGNOSIS — R1310 Dysphagia, unspecified: Secondary | ICD-10-CM

## 2012-08-22 DIAGNOSIS — K219 Gastro-esophageal reflux disease without esophagitis: Secondary | ICD-10-CM

## 2012-08-22 DIAGNOSIS — Z1211 Encounter for screening for malignant neoplasm of colon: Secondary | ICD-10-CM

## 2012-08-22 MED ORDER — OMEPRAZOLE 20 MG PO CPDR: DELAYED_RELEASE_CAPSULE | ORAL | Status: DC

## 2012-08-22 NOTE — Telephone Encounter (Signed)
This is an Financial planner for Dr. Gwenlyn Saran.  Wendy [name] has called and rescheduled her med refill appt again.  This will make 5 No Shows/Cancels in 2 months.  She said that she has an appt at [facility] this morning and doesn't want to have to be out too much.  She rescheduled for Friday 1/3, which she has an appt with [facility] that day too.  I explained that to her, and she said that 2 appts on Friday is ok.

## 2012-08-22 NOTE — Telephone Encounter (Signed)
Patient came downstairs from her pre-visit for colon/endo set for 08/26/2012 with Dr. Stan Head.  Requested samples of Prilosec.  Given to patient and documented.

## 2012-08-22 NOTE — Telephone Encounter (Signed)
Fwd. To Dr.Losq for info. Wendy [name], Wendy [name]

## 2012-08-24 HISTORY — PX: COLONOSCOPY: SHX174

## 2012-08-25 ENCOUNTER — Telehealth: Payer: Self-pay | Admitting: Internal Medicine

## 2012-08-25 NOTE — Telephone Encounter (Signed)
Pt is concerned that she cannot take Coreg without food.  I talked with Rosanna, pharmacist at [facility] and she says pt is okay to take Coreg.  Pt instructed to take meds as instructed day before and day of procedure.

## 2012-08-26 ENCOUNTER — Other Ambulatory Visit: Payer: Self-pay | Admitting: Family Medicine

## 2012-08-26 ENCOUNTER — Telehealth: Payer: Self-pay | Admitting: Family Medicine

## 2012-08-26 ENCOUNTER — Telehealth: Payer: Self-pay | Admitting: Internal Medicine

## 2012-08-26 ENCOUNTER — Encounter: Payer: Self-pay | Admitting: Internal Medicine

## 2012-08-26 ENCOUNTER — Ambulatory Visit (AMBULATORY_SURGERY_CENTER): Payer: Medicare Other | Admitting: Internal Medicine

## 2012-08-26 ENCOUNTER — Ambulatory Visit: Payer: Medicare Other | Admitting: Family Medicine

## 2012-08-26 VITALS — BP 145/96 | HR 71 | Temp 97.8°F | Resp 13 | Ht 62.0 in | Wt 205.0 lb

## 2012-08-26 DIAGNOSIS — R1310 Dysphagia, unspecified: Secondary | ICD-10-CM | POA: Diagnosis not present

## 2012-08-26 DIAGNOSIS — Z1211 Encounter for screening for malignant neoplasm of colon: Secondary | ICD-10-CM | POA: Diagnosis not present

## 2012-08-26 DIAGNOSIS — K5730 Diverticulosis of large intestine without perforation or abscess without bleeding: Secondary | ICD-10-CM

## 2012-08-26 DIAGNOSIS — K449 Diaphragmatic hernia without obstruction or gangrene: Secondary | ICD-10-CM

## 2012-08-26 DIAGNOSIS — K222 Esophageal obstruction: Secondary | ICD-10-CM | POA: Diagnosis not present

## 2012-08-26 DIAGNOSIS — I10 Essential (primary) hypertension: Secondary | ICD-10-CM | POA: Diagnosis not present

## 2012-08-26 MED ORDER — SODIUM CHLORIDE 0.9 % IV SOLN: 500.0000 mL | INTRAVENOUS | Status: DC

## 2012-08-26 NOTE — Telephone Encounter (Signed)
Pt had 18 no shows and cancellations. Has been informed several times in the past that she needs appt. Fwd. To Dr.Losq for review.  Wendy [name], Wendy [name]

## 2012-08-26 NOTE — Telephone Encounter (Signed)
Patient is calling to cancel her appt again.  She would appreciate if her Klonopin can be called in.  She is rescheduled for 1/8.  She took her last pain pill today and needs more if it as well.  She said this is all her fault and that she should have listened when she was told it might not be a good idea to plan on coming here after having a colonoscopy.

## 2012-08-26 NOTE — Op Note (Addendum)
 [facility] [address] [place], [address]   ENDOSCOPY PROCEDURE REPORT  PATIENT: Wendy [name], Wendy [name]  MR#: [id number] BIRTHDATE: Mar 28, 1961 , 51  yrs. old GENDER: Female ENDOSCOPIST: Iva Boop, MD, [facility] PROCEDURE DATE:  08/26/2012 PROCEDURE:  Esophagoscopy  with balloon dilation < 30 mm ASA CLASS:     Class III INDICATIONS:  Dysphagia. MEDICATIONS: propofol (Diprivan) 250mg  IV, MAC sedation, administered by CRNA, and These medications were titrated to patient response per physician's verbal order TOPICAL ANESTHETIC: Cetacaine Spray  DESCRIPTION OF PROCEDURE: After the risks benefits and alternatives of the procedure were thoroughly explained, informed consent was obtained.  The [facility] [id number] endoscope was introduced through the mouth and advanced to the stomach body. Without limitations. The instrument was slowly withdrawn as the mucosa was fully examined.        ESOPHAGUS: A stricture was found at the gastroesophageal junction. The stenosis was traversable with the endoscope.   A 5 cm hiatal hernia was noted.   Balloon dilation of strcture performed 15-16.5-18 and 20 mm. No heme seen. She had reported improvement after dilation to 18 mm in 09/2011.  STOMACH: Moderate portal hypertensive gastropathy was found. Retroflexed views revealed a hiatal hernia.     The scope was then withdrawn from the patient and the procedure completed.  COMPLICATIONS: There were no complications. ENDOSCOPIC IMPRESSION: 1.   Stricture was found at the gastroesophageal junction- dilated to 20 mm - no heme. 2.   5 cm hiatal hernia 3.   Portal hypertensive gastropathy was found 4.   Exam only to mid-body of stomach.  RECOMMENDATIONS: 1.  Clear liquids until 1000, then soft foods rest of day.  Resume prior diet tomorrow. 2.  consider barium study/manometry if persistent dysphagia    eSigned:  Iva Boop, MD, [facility] 08/26/2012 11:17 AM Revised:  08/26/2012 11:17 AM  CC:The Patient and Jason Coop MD

## 2012-08-26 NOTE — Progress Notes (Signed)
Patient did not experience any of the following events: a burn prior to discharge; a fall within the facility; wrong site/side/patient/procedure/implant event; or a hospital transfer or hospital admission upon discharge from the facility. (G8907) Patient did not have preoperative order for IV antibiotic SSI prophylaxis. (G8918)  

## 2012-08-26 NOTE — Patient Instructions (Addendum)
I dilated the esophagus again - more than in February 2013. Let me know if you continue to have trouble swallowing.  The colonoscopy did not show any polyps - just diverticulosis. Do not worry about that - it is common and not usually a problem. Next routine colonoscopy in 10 years 2024.  Thank you for choosing me and [facility].  Iva Boop, MD, FACG YOU HAD AN ENDOSCOPIC PROCEDURE TODAY AT THE [facility]: Refer to the procedure report that was given to you for any specific questions about what was found during the examination.  If the procedure report does not answer your questions, please call your gastroenterologist to clarify.  If you requested that your care partner not be given the details of your procedure findings, then the procedure report has been included in a sealed envelope for you to review at your convenience later.  YOU SHOULD EXPECT: Some feelings of bloating in the abdomen. Passage of more gas than usual.  Walking can help get rid of the air that was put into your GI tract during the procedure and reduce the bloating. If you had a lower endoscopy (such as a colonoscopy or flexible sigmoidoscopy) you may notice spotting of blood in your stool or on the toilet paper. If you underwent a bowel prep for your procedure, then you may not have a normal bowel movement for a few days.  DIET: Your first meal following the procedure should be a light meal and then it is ok to progress to your normal diet.  A half-sandwich or bowl of soup is an example of a good first meal.  Heavy or fried foods are harder to digest and may make you feel nauseous or bloated.  Likewise meals heavy in dairy and vegetables can cause extra gas to form and this can also increase the bloating.  Drink plenty of fluids but you should avoid alcoholic beverages for 24 hours.  ACTIVITY: Your care partner should take you home directly after the procedure.  You should plan to take it easy,  moving slowly for the rest of the day.  You can resume normal activity the day after the procedure however you should NOT DRIVE or use heavy machinery for 24 hours (because of the sedation medicines used during the test).    SYMPTOMS TO REPORT IMMEDIATELY: A gastroenterologist can be reached at any hour.  During normal business hours, 8:30 AM to 5:00 PM Monday through Friday, call [phone].  After hours and on weekends, please call the GI answering service at [phone] who will take a message and have the physician on call contact you.   Following lower endoscopy (colonoscopy or flexible sigmoidoscopy):  Excessive amounts of blood in the stool  Significant tenderness or worsening of abdominal pains  Swelling of the abdomen that is new, acute  Fever of 100F or higher  Following upper endoscopy (EGD)  Vomiting of blood or coffee ground material  New chest pain or pain under the shoulder blades  Painful or persistently difficult swallowing  New shortness of breath  Fever of 100F or higher  Black, tarry-looking stools  FOLLOW UP: If any biopsies were taken you will be contacted by phone or by letter within the next 1-3 weeks.  Call your gastroenterologist if you have not heard about the biopsies in 3 weeks.  Our staff will call the home number listed on your records the next business day following your procedure to check on you and address any  questions or concerns that you may have at that time regarding the information given to you following your procedure. This is a courtesy call and so if there is no answer at the home number and we have not heard from you through the emergency physician on call, we will assume that you have returned to your regular daily activities without incident.  SIGNATURES/CONFIDENTIALITY: You and/or your care partner have signed paperwork which will be entered into your electronic medical record.  These signatures attest to the fact that that the  information above on your After Visit Summary has been reviewed and is understood.  Full responsibility of the confidentiality of this discharge information lies with you and/or your care-partner.   Diverticulosis, high fiber diet, post dilation diet information reviewed and given to pt, stricture information and hiatal hernia information Also given to pt. prior to discharge.  Next colonoscopy in 10 years-2024.

## 2012-08-26 NOTE — Telephone Encounter (Signed)
I advised the pt the name of the exercise program was 7 minute workout.  However, Dr. Leone Payor did not recommend she start this regime without checking with her orthopedic surgeon being she needs a hip replacement.  The pt said she understood, but really wanted to give the name of the exercise program to her friend.  Maw

## 2012-08-26 NOTE — Op Note (Signed)
[facility] [address] [place], [address]   COLONOSCOPY PROCEDURE REPORT  PATIENT: Wendy, [name]  MR#: [id number] BIRTHDATE: Jun 06, 1961 , 51  yrs. old GENDER: Female ENDOSCOPIST: Iva Boop, MD, [facility] PROCEDURE DATE:  08/26/2012 PROCEDURE:   Colonoscopy, screening ASA CLASS:   Class III INDICATIONS:average risk screening. MEDICATIONS: There was residual sedation effect present from prior procedure, MAC sedation, administered by CRNA, These medications were titrated to patient response per physician's verbal order, and propofol (Diprivan) 150mg  IV  DESCRIPTION OF PROCEDURE:   After the risks benefits and alternatives of the procedure were thoroughly explained, informed consent was obtained.  A digital rectal exam revealed no abnormalities of the rectum.   The LB CF-[id number]  endoscope was introduced through the anus and advanced to the cecum, which was identified by both the appendix and ileocecal valve. No adverse events experienced.   The quality of the prep was excellent, using MoviPrep  The instrument was then slowly withdrawn as the colon was fully examined.      COLON FINDINGS: Moderate diverticulosis was noted The finding was in the left colon.   There was mild scattered diverticulosis noted in the right colon.   The colon mucosa was otherwise normal. Retroflexed views revealed no abnormalities. The time to cecum=3 minutes 09 seconds.  Withdrawal time=10 minutes 37 seconds.  The scope was withdrawn and the procedure completed. COMPLICATIONS: There were no complications.  ENDOSCOPIC IMPRESSION: 1.   Moderate diverticulosis was noted in the left colon 2.   There was mild diverticulosis noted in the left colon 3.   The colon mucosa was otherwise normal - excellent prep  RECOMMENDATIONS: Repeat colonoscopy 10 years.   eSigned:  Iva Boop, MD, [facility] 08/26/2012 9:27 AM   cc: The Patient and Hayashi,Paul MD

## 2012-08-29 ENCOUNTER — Telehealth: Payer: Self-pay | Admitting: Internal Medicine

## 2012-08-29 ENCOUNTER — Telehealth: Payer: Self-pay | Admitting: *Deleted

## 2012-08-29 NOTE — Telephone Encounter (Signed)
Spoke with patient about 4:55pm this evening.   She did state that she "feels better.", but she still has a bowel movement immediately after she eats.   She is worried.  There is no pain, no bleeding and just some mucous with a little stool.  I told her to go to the ED if it really gets bad, and that I or Dr. Leone Payor would get in touch with her tomorrow.

## 2012-08-29 NOTE — Telephone Encounter (Signed)
Patient states that everything she eats goes through her, and she has clear leakage.  Patient called over the weekend and she states that she has eaten fiber as ordered.  States it's like it's mucous and a little stool.  States has had oatmeal, beans, grits and eggs.  States that she has a raw hind end.   Patient states that she is leaking the mucous and some stool.  Even leaks when she doesn't eat.

## 2012-08-29 NOTE — Telephone Encounter (Signed)
  Follow up Call-  Call back number 08/26/2012 10/14/2011  Post procedure Call Back phone  # [phone]  Permission to leave phone message Yes Yes     Patient questions:  Message left to call if necessary.

## 2012-08-29 NOTE — Telephone Encounter (Signed)
Attempted to call patient to tell her that I hadn't heard from the doctor yet, but for her to keep eating her high fiber diet.    There was no answer at her home.   No message left as it did not go to voice mail.  Called Dr. Leone Payor and told him to watch for the message from me as a phone call.  This was at 4:40pm.

## 2012-08-30 ENCOUNTER — Telehealth: Payer: Self-pay | Admitting: *Deleted

## 2012-08-30 ENCOUNTER — Telehealth: Payer: Self-pay | Admitting: Internal Medicine

## 2012-08-30 NOTE — Telephone Encounter (Signed)
Left a message for patient to discontinue the high fiber diet per Dr. Leone Payor.  She was instructed to stay on a bland diet for a few days.   I also gave her the phone # if she needs to contact us again.   Dr. Leone Payor does not think that this is anything serious.

## 2012-08-30 NOTE — Telephone Encounter (Signed)
Patient was sleeping when I called back.  He stated that he just wanted to know what bland foods were.  I explained what a bland diet was to him with good feedback.

## 2012-08-31 ENCOUNTER — Ambulatory Visit (INDEPENDENT_AMBULATORY_CARE_PROVIDER_SITE_OTHER): Payer: Medicare Other | Admitting: Family Medicine

## 2012-08-31 VITALS — BP 108/73 | HR 63 | Ht 62.0 in | Wt 186.0 lb

## 2012-08-31 DIAGNOSIS — F411 Generalized anxiety disorder: Secondary | ICD-10-CM | POA: Diagnosis not present

## 2012-08-31 DIAGNOSIS — F419 Anxiety disorder, unspecified: Secondary | ICD-10-CM

## 2012-08-31 DIAGNOSIS — M25559 Pain in unspecified hip: Secondary | ICD-10-CM | POA: Diagnosis not present

## 2012-08-31 DIAGNOSIS — F329 Major depressive disorder, single episode, unspecified: Secondary | ICD-10-CM | POA: Diagnosis not present

## 2012-08-31 DIAGNOSIS — M25551 Pain in right hip: Secondary | ICD-10-CM

## 2012-08-31 DIAGNOSIS — M169 Osteoarthritis of hip, unspecified: Secondary | ICD-10-CM | POA: Diagnosis not present

## 2012-08-31 DIAGNOSIS — F4001 Agoraphobia with panic disorder: Secondary | ICD-10-CM | POA: Diagnosis not present

## 2012-08-31 DIAGNOSIS — F3289 Other specified depressive episodes: Secondary | ICD-10-CM

## 2012-08-31 MED ORDER — OXYCODONE HCL 10 MG PO TABS: 10.0000 mg | ORAL_TABLET | Freq: Three times a day (TID) | ORAL | Status: DC | PRN

## 2012-08-31 MED ORDER — METHOCARBAMOL 500 MG PO TABS: 500.0000 mg | ORAL_TABLET | Freq: Three times a day (TID) | ORAL | Status: DC | PRN

## 2012-08-31 MED ORDER — CLONAZEPAM 1 MG PO TABS: 1.0000 mg | ORAL_TABLET | Freq: Three times a day (TID) | ORAL | Status: DC | PRN

## 2012-08-31 NOTE — Progress Notes (Signed)
Patient ID: Wendy [name]    DOB: 28-Mar-1961, 52 y.o.   MRN: [id number] --- Subjective:  Wendy [name] is a 52 y.o.female who presents for follow up. Concerns include the following: - right hip pain: was seen by Dr. Charlann Boxer who offered a revision of right hemiarthroplasty and likely total hip replacement. Patient continues to have persistent pain, using oxycodone 10mg  three times daily. She has not been able to go out or walk around due to pain. Pain is located in right groin and hip along with lower back. No new onset lower extremity weakness.  - anxiety and depression: patient reports feeling more anxious lately. She has lost contact with one of her daughters which is very upsetting to her. She reports episodes of anxiety attacks to the point of vomiting from it. She has been having sleep disturbances, decreased appetite, staying in bed, crying episodes and decreased energy. She denies SI/HI. She was on zoloft 2-3 months ago which she topped taking because she could not see any true improvement on it. She asks if it would be possible for her to talk with our clinical psychologist, Dr. Pascal Lux in order to try to sort out through her anxiety. 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 She takes it anywhere between 1 time to 3 times daily, before going out for appointments or before phone calls.     ROS: see HPI Past Medical History: reviewed and updated medications and allergies. Social History: Tobacco: 0.25 packs per day  Objective: Filed Vitals:   08/31/12 1544  BP: 108/73  Pulse: 63    Physical Examination:   General appearance - alert, well appearing, and tearful when talking about her anxiety. Otherwise, coherent with normal affect and speech pattern MSK - tender to palpation along right lateral aspect of right hip and thigh. 4/5 strength with knee flexion and extension bilaterally.

## 2012-08-31 NOTE — Assessment & Plan Note (Signed)
Plan for possible surgery on right hip. Patient in significant pain currently with poor mobility. Refill oxycodone 10mg  tid/prn #90 tabs.

## 2012-08-31 NOTE — Assessment & Plan Note (Signed)
Refilled clonazepam 1mg  tid/prn 90 tabs.

## 2012-08-31 NOTE — Patient Instructions (Signed)
I recommend that you meet with our psychologist, Dr. Spero Geralds, for help dealing with your anxiety and depression.  You can schedule an appointment with her by calling her directly at [phone].  IF you can't get an appointment with her in the next 2 weeks or so, call the office and we can start you back on zoloft or medicine for depression and anxiety.

## 2012-08-31 NOTE — Assessment & Plan Note (Signed)
Depression with component of anxiety. PHQ-9 not obtained but patient positive for sleep disturbance, low appetite, tearful episodes and lack of interest. No red flags such as SI/HI. Patient would like to see a psychologist to talk about possible non pharmaceutical strategies to cope with her anxiety. She is not opposed to eventually trying going back n zoloft or other SSRI, but she would like to try counseling first. Gave Dr. Carola Rhine phone number for patient to contact.

## 2012-09-02 ENCOUNTER — Telehealth: Payer: Self-pay | Admitting: Psychology

## 2012-09-02 NOTE — Telephone Encounter (Signed)
Wendy [name] called to schedule an appointment.  Reviewed Dr. Whitney Muse notes.  Scheduled for 1/17 at 10:00.    I told her the following: -  If she is unable to make the appointment she needs to call me. -  If she misses the appointment without a phone call, I won't be able to schedule her back in my clinic. She voiced an understanding and was able to repeat the appointment date and time back to me.

## 2012-09-08 ENCOUNTER — Telehealth: Payer: Self-pay | Admitting: Psychology

## 2012-09-08 ENCOUNTER — Telehealth: Payer: Self-pay | Admitting: Family Medicine

## 2012-09-08 MED ORDER — TRAZODONE HCL 50 MG PO TABS: 25.0000 mg | ORAL_TABLET | Freq: Every evening | ORAL | Status: DC | PRN

## 2012-09-08 NOTE — Telephone Encounter (Signed)
Patient called triage line asking for something to sleep since her daughter just died and she doesn't think she is going to be able to sleep.  Will Rx for trazadone 5 tablets.

## 2012-09-08 NOTE — Telephone Encounter (Signed)
Pt called asking for sleep meds - her daughter died last night and is asking for something to help her sleep

## 2012-09-08 NOTE — Telephone Encounter (Signed)
Patient informed to check with pharmacy.  FYI.Marland KitchenMarland KitchenPatient states she has an appt with PCP on 09/20/12 and will need surgical clearance for hip surgery with Dr. Charlann Boxer on 10/10/12.  Will discuss with Dr. Gwenlyn Saran at follow-up appt.  Gaylene Brooks, RN
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
## 2012-09-08 NOTE — Telephone Encounter (Signed)
Paged Dr. Gwenlyn Saran.  Gaylene Brooks, RN

## 2012-09-15 ENCOUNTER — Telehealth: Payer: Self-pay | Admitting: Psychology

## 2012-09-15 ENCOUNTER — Other Ambulatory Visit: Payer: Self-pay | Admitting: Family Medicine

## 2012-09-15 NOTE — Telephone Encounter (Signed)
Patient called to reschedule.  First available was 1/31 at 10:00.  She took that but may call to cancel.  Wanted to come on the same day as her PCP appointment but didn't have availability on my end.

## 2012-09-20 ENCOUNTER — Ambulatory Visit: Payer: Medicare Other | Admitting: Family Medicine

## 2012-09-22 ENCOUNTER — Telehealth: Payer: Self-pay | Admitting: Family Medicine

## 2012-09-22 NOTE — Telephone Encounter (Signed)
Filled out form for TENS device.  Length of need: 6 months.  Diagnosis: 721.3 (lumbosacral spondylosis), 719.45 (pain in joint and pelvic region).

## 2012-09-22 NOTE — Telephone Encounter (Signed)
Patient is calling because a request for supplies to go with the MPI machine that she uses on her back was sent over a week ago and they haven't gotten a reply yet and she is calling to check the status.

## 2012-09-22 NOTE — Telephone Encounter (Signed)
Spoke with patient, says she needs supplies for her empi machine for her back pain. Judeth Cornfield, do you know anything about the request form, I did not see anything in your box.Busick, [name]

## 2012-09-23 ENCOUNTER — Telehealth: Payer: Self-pay | Admitting: *Deleted

## 2012-09-23 ENCOUNTER — Telehealth: Payer: Self-pay | Admitting: Psychology

## 2012-09-23 NOTE — Telephone Encounter (Signed)
Left message with a friend of the patient to return call. Please tell her the paperwork she requested was faxed this morning.Addelyn Alleman, [name]

## 2012-09-23 NOTE — Telephone Encounter (Signed)
Wendy [name] called at 1:08 this morning and left a VM to state she would not be able to attend her 10:00 beh med appointment.  She would like to reschedule and said she would call back on Monday.

## 2012-09-26 NOTE — Telephone Encounter (Signed)
Pt never called back. .Wendy [name]  

## 2012-09-27 ENCOUNTER — Encounter: Payer: Self-pay | Admitting: Family Medicine

## 2012-09-27 ENCOUNTER — Ambulatory Visit (INDEPENDENT_AMBULATORY_CARE_PROVIDER_SITE_OTHER): Payer: Medicare Other | Admitting: Family Medicine

## 2012-09-27 ENCOUNTER — Ambulatory Visit (HOSPITAL_COMMUNITY)
Admission: RE | Admit: 2012-09-27 | Discharge: 2012-09-27 | Disposition: A | Discharge status: Home / Self Care | Payer: Medicare Other | Source: Ambulatory Visit | Attending: Family Medicine | Admitting: Family Medicine

## 2012-09-27 VITALS — BP 135/82 | HR 74 | Ht 62.0 in | Wt 198.0 lb

## 2012-09-27 DIAGNOSIS — F329 Major depressive disorder, single episode, unspecified: Secondary | ICD-10-CM

## 2012-09-27 DIAGNOSIS — I10 Essential (primary) hypertension: Secondary | ICD-10-CM | POA: Diagnosis not present

## 2012-09-27 DIAGNOSIS — F419 Anxiety disorder, unspecified: Secondary | ICD-10-CM

## 2012-09-27 DIAGNOSIS — Z01818 Encounter for other preprocedural examination: Secondary | ICD-10-CM | POA: Diagnosis not present

## 2012-09-27 DIAGNOSIS — F411 Generalized anxiety disorder: Secondary | ICD-10-CM | POA: Diagnosis not present

## 2012-09-27 DIAGNOSIS — M25559 Pain in unspecified hip: Secondary | ICD-10-CM | POA: Diagnosis not present

## 2012-09-27 DIAGNOSIS — Z01810 Encounter for preprocedural cardiovascular examination: Secondary | ICD-10-CM | POA: Insufficient documentation

## 2012-09-27 DIAGNOSIS — M25551 Pain in right hip: Secondary | ICD-10-CM

## 2012-09-27 MED ORDER — OXYCODONE HCL 10 MG PO TABS: 10.0000 mg | ORAL_TABLET | Freq: Three times a day (TID) | ORAL | Status: DC | PRN

## 2012-09-27 MED ORDER — CLONAZEPAM 1 MG PO TABS: 1.0000 mg | ORAL_TABLET | Freq: Three times a day (TID) | ORAL | Status: DC | PRN

## 2012-09-27 NOTE — Patient Instructions (Signed)
I will send in the clearance once I have the results of the stress test.   We will hold on the zoloft for now and start it after surgery.

## 2012-09-27 NOTE — Progress Notes (Signed)
Patient ID: Wendy [name]    DOB: Jun 22, 1961, 52 y.o.   MRN: [id number] --- Subjective:  Wendy [name] is a 52 y.o.female with h/o hepatitis C cirrhosis, panic disorder, anxiety and chronic right hip pain s/p ORIF hip fracture and subsequent hemiarthroplasty who presents for medical clearance for upcoming conversion of previous hip surgery to total hip arthroplasty. She is also here for a medication refill on oxycodone and clonazepam.   - a good part of the visit was spent talking about the recent death of Wendy [name]'s 53 yo daughter from an apparent drug overdose.  She is still interested in talking with Dr. Pascal Lux, but given her upcoming surgery and appointments for the surgery, she doesn't want to commit to an appointment with Dr. Pascal Lux that she may end up cancelling.  Denies SI/HI.   - medical clearance: she reports not being able to go up a flight of stairs due to her hip pain and is not able to walk in the super market for groceries due to hip pain either. Some shortness of breath associated with the pain. No shortness of breath at rest. Reports an indigestion feeling in the epigastric region but no chest pain. Pain lasts a minute and is not exercise induced. Denies paroxysmal nocturnal dyspnea or 2 pillow orthopnea.  No cardiac history. Was on furosemide and spironolactone for leg swelling and ascites, but she has quit taking them a few months ago.   - hip pain and back pain: 6/10 today. She takes 0 to 3 oxycodone per day depending on her activity level. No new weakness or numbness.   ROS: see HPI Past Medical History: reviewed and updated medications and allergies. Social History: Tobacco: 0.25cigs per day  Objective: Filed Vitals:   09/27/12 1441  BP: 135/82  Pulse: 74    Physical Examination:   General appearance - alert, well appearing, and in no distress Neck - supple, no significant adenopathy Chest - clear to auscultation, no wheezes, rales or rhonchi, symmetric air  entry Heart - normal rate, regular rhythm, normal S1, S2, no murmurs, rubs, clicks or gallops Extremities - trace pitting edema in lower extremities bilaterally Back - tenderness to palpation along paralumbar muscles bilaterally

## 2012-09-27 NOTE — Assessment & Plan Note (Addendum)
Scheduled for surgery on 10/10/12 for total right hip arthroplasty.Has been cleared by her hepatologist: Dr. Julieta Gutting, regarding her hep C cirrhosis and associated encephalopathy. Refilled oxycodone 10mg  tid/prn 90tabs, no refills.

## 2012-09-27 NOTE — Assessment & Plan Note (Signed)
Patient undergoing stressful and emotional time at this time both with upcoming surgery but mostly with recent death of her daughter. Will not start her on SSRI at this time since close follow up would not be possible with upcoming surgery. WIll start it after surgery. Patient to contact Dr. Pascal Lux as well.

## 2012-09-27 NOTE — Assessment & Plan Note (Addendum)
Assessment of patient's METs is difficult given that mobility is limited due to pain. At this stage, patient has an overall poor functional status. Moreover, total hip arthroplasty is considered intermediate to high risk, especially with patient's underlying co-morbidities of cirrhosis.  Will obtain a dobutamine stress test prior to surgery to rule out any cardiovascular process. EKG obtained in office did not show any acute findings.  Continue coreg 12.5mg  bid. BP controled.

## 2012-09-28 ENCOUNTER — Telehealth: Payer: Self-pay | Admitting: Family Medicine

## 2012-09-28 ENCOUNTER — Telehealth: Payer: Self-pay | Admitting: *Deleted

## 2012-09-28 NOTE — Telephone Encounter (Signed)
Patient is calling because she misplaced her appt card for the stress test.  I let her know when it was scheduled for and she said that she can't do it on that day so she wants it rescheduled.

## 2012-09-28 NOTE — Telephone Encounter (Addendum)
Clydie Braun calling because patient has an appt for stress test on 10/07/12 (Friday) and is having surgery with Dr. Lajoyce Corners [facility]) on 10/10/12 (Monday).  Office calling because patient is concerned her stress test results will not be back in time and her surgery will have to be rescheduled.  Office wants to know if we can get stress test rescheduled for sooner than 10/07/12.  Will route note to Dr. Gwenlyn Saran and Red team and will call Clydie Braun back.  Gaylene Brooks, RN

## 2012-09-28 NOTE — Telephone Encounter (Signed)
Left message for patient to return call. Please tell her if she wants to reschedule the appointment she will need to call [facility] at [phone].Elias Bordner, [name]

## 2012-09-28 NOTE — Telephone Encounter (Signed)
Patient returned call to [name] and was given the message below.

## 2012-09-29 DIAGNOSIS — H3530 Unspecified macular degeneration: Secondary | ICD-10-CM | POA: Diagnosis not present

## 2012-09-29 NOTE — Telephone Encounter (Signed)
[facility] and they are unable to reschedule her appointment for stress test to an earlier date. Called and gave this information to Clydie Braun at Dr Nilsa Nutting office and she is going to attempt to move her pre-op appointment. Clydie Braun is going to call and give this information to the patient.Busick, [name]

## 2012-10-03 ENCOUNTER — Encounter (HOSPITAL_COMMUNITY): Payer: Self-pay | Admitting: Pharmacy Technician

## 2012-10-03 NOTE — Telephone Encounter (Signed)
Another phone note was opened on 08/29/12 related to this

## 2012-10-04 ENCOUNTER — Ambulatory Visit: Payer: Medicare Other | Admitting: Family Medicine

## 2012-10-05 ENCOUNTER — Other Ambulatory Visit (HOSPITAL_COMMUNITY): Payer: Medicare Other

## 2012-10-05 ENCOUNTER — Inpatient Hospital Stay (HOSPITAL_COMMUNITY): Admission: RE | Admit: 2012-10-05 | Payer: Medicare Other | Source: Ambulatory Visit

## 2012-10-05 ENCOUNTER — Telehealth: Payer: Self-pay | Admitting: Family Medicine

## 2012-10-05 NOTE — Telephone Encounter (Signed)
Pt has started smoking again and is asking for Chantix to help her [facility] [address]  Wanted also to compliment Dr Gwenlyn Saran from the hospital - stating that she has done a great job!

## 2012-10-06 ENCOUNTER — Inpatient Hospital Stay (HOSPITAL_COMMUNITY): Admission: RE | Admit: 2012-10-06 | Payer: Medicare Other | Source: Ambulatory Visit

## 2012-10-07 ENCOUNTER — Telehealth: Payer: Self-pay | Admitting: Family Medicine
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
## 2012-10-10 ENCOUNTER — Other Ambulatory Visit: Payer: Self-pay | Admitting: Internal Medicine

## 2012-10-10 ENCOUNTER — Encounter: Payer: Self-pay | Admitting: Family Medicine

## 2012-10-10 ENCOUNTER — Telehealth: Payer: Self-pay | Admitting: Psychology

## 2012-10-10 NOTE — Telephone Encounter (Signed)
Will fwd. To Dr.Losq for info/review. Lorenda Hatchet, Renato Battles
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
## 2012-10-13 ENCOUNTER — Other Ambulatory Visit (HOSPITAL_COMMUNITY): Payer: Medicare Other

## 2012-10-19 ENCOUNTER — Other Ambulatory Visit (HOSPITAL_COMMUNITY): Payer: Medicare Other

## 2012-10-20 ENCOUNTER — Ambulatory Visit (INDEPENDENT_AMBULATORY_CARE_PROVIDER_SITE_OTHER): Payer: Medicare Other | Admitting: Family Medicine

## 2012-10-20 ENCOUNTER — Encounter: Payer: Self-pay | Admitting: Family Medicine

## 2012-10-20 VITALS — BP 122/64 | HR 90 | Wt 194.0 lb

## 2012-10-20 DIAGNOSIS — F4001 Agoraphobia with panic disorder: Secondary | ICD-10-CM

## 2012-10-20 DIAGNOSIS — F329 Major depressive disorder, single episode, unspecified: Secondary | ICD-10-CM

## 2012-10-20 DIAGNOSIS — F17200 Nicotine dependence, unspecified, uncomplicated: Secondary | ICD-10-CM

## 2012-10-20 DIAGNOSIS — Z720 Tobacco use: Secondary | ICD-10-CM

## 2012-10-20 DIAGNOSIS — M25551 Pain in right hip: Secondary | ICD-10-CM

## 2012-10-20 DIAGNOSIS — M25559 Pain in unspecified hip: Secondary | ICD-10-CM | POA: Diagnosis not present

## 2012-10-20 DIAGNOSIS — F3289 Other specified depressive episodes: Secondary | ICD-10-CM

## 2012-10-20 MED ORDER — OXYCODONE HCL 10 MG PO TABS: 10.0000 mg | ORAL_TABLET | Freq: Three times a day (TID) | ORAL | Status: DC | PRN

## 2012-10-20 MED ORDER — CYCLOSPORINE 0.05 % OP EMUL: 1.0000 [drp] | Freq: Two times a day (BID) | OPHTHALMIC | Status: DC

## 2012-10-20 MED ORDER — VARENICLINE TARTRATE 0.5 MG PO TABS: 0.5000 mg | ORAL_TABLET | Freq: Two times a day (BID) | ORAL | Status: DC

## 2012-10-20 MED ORDER — VARENICLINE TARTRATE 1 MG PO TABS: 1.0000 mg | ORAL_TABLET | Freq: Two times a day (BID) | ORAL | Status: DC

## 2012-10-20 MED ORDER — CLONAZEPAM 1 MG PO TABS: 1.0000 mg | ORAL_TABLET | Freq: Three times a day (TID) | ORAL | Status: DC | PRN

## 2012-10-20 MED ORDER — QUETIAPINE FUMARATE 50 MG PO TABS: 50.0000 mg | ORAL_TABLET | Freq: Every day | ORAL | Status: DC

## 2012-10-20 MED ORDER — QUETIAPINE FUMARATE 25 MG PO TABS: 25.0000 mg | ORAL_TABLET | Freq: Every day | ORAL | Status: DC
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
## 2012-10-20 NOTE — Patient Instructions (Addendum)
Chantix: Take as follows: Days 1-3: 0.5 mg once daily Days 4-7: 0.5 mg twice daily Then, 1mg  twice daily for 11 weeks.   For the seroquel:  Take 25mg  on day 1, then increase to 50mg  on day 2, then increase by 50mg  each day until you reach 200mg .  Follow up in 2 weeks.

## 2012-10-22 ENCOUNTER — Other Ambulatory Visit: Payer: Self-pay | Admitting: Internal Medicine

## 2012-10-22 ENCOUNTER — Other Ambulatory Visit: Payer: Self-pay | Admitting: Family Medicine

## 2012-10-22 DIAGNOSIS — Z720 Tobacco use: Secondary | ICD-10-CM | POA: Insufficient documentation

## 2012-10-22 NOTE — Assessment & Plan Note (Signed)
Mood disorder appears consistent with bipolar depression. High MDQ score, family history and failure of antidepressant therapy in the past points to that. Will start seroquel. Patient with liver disease, so start at 25mg  and increase by daily increments as described in AVS.  Follow up in 2 weeks.

## 2012-10-22 NOTE — Assessment & Plan Note (Signed)
Patient recently picked up smoking again after over a year of cessation. Had success with chantix. Will send Rx for chantix.

## 2012-10-22 NOTE — Assessment & Plan Note (Signed)
Surgery indefinitely postponed by patient. Refilled oxycodone 10mg  tid/prn 90tabs no refills.

## 2012-10-22 NOTE — Assessment & Plan Note (Signed)
Refill clonazepam

## 2012-10-24 NOTE — Telephone Encounter (Signed)
LM to call back so I can see why she is requesting the xifaxin refill.

## 2012-10-25 ENCOUNTER — Telehealth: Payer: Self-pay | Admitting: Internal Medicine

## 2012-10-25 NOTE — Telephone Encounter (Signed)
Ok to refill x 4 

## 2012-10-25 NOTE — Telephone Encounter (Signed)
Medication sent to Rx by CMA

## 2012-10-25 NOTE — Telephone Encounter (Signed)
Spoke to patient and she is requesting omeprazole 20mg  BID, is this ok Sir? Sheri, RN explained why she takes the xifaxin long term.

## 2012-10-27 ENCOUNTER — Telehealth: Payer: Self-pay | Admitting: Family Medicine

## 2012-10-27 MED ORDER — QUETIAPINE FUMARATE 25 MG PO TABS: 25.0000 mg | ORAL_TABLET | Freq: Every day | ORAL | Status: DC

## 2012-10-27 NOTE — Telephone Encounter (Signed)
Pt is asking to speak with nurse about the seroquil - needs better instructions

## 2012-10-27 NOTE — Telephone Encounter (Signed)
Called pt and explained instructions below several times. Pt reports, that she only took the Seroquel 25 mg one tablet one week ago. That's it. She wants to know what to do. (Take 25mg  on day 1, then increase to 50mg  on day 2, then increase by 50mg  each day until you reach 200mg .  Follow up in 2 weeks. ) I told the pt, that I will ask Dr.Losq to call her today after clinic. Pt has many questions ... Also wants to know, if Dr.Losq will call in more Seroquel for her and if she should take Seroquel 25 mg again to start. She can not break the 50 mg into half. Fwd. To Dr.Losq .Arlyss Repress

## 2012-10-27 NOTE — Telephone Encounter (Signed)
Called patient back to go over seroquel dosing again with her. Patient has cirrhosis and therefore needs modified dosing. Start with 25mg  on day 1, then 50mg  on day2, 100mg  day 3, 150mg  on day 4 and 200mg  on day 5. Patient took 25mg  seroquel a week ago and didn't take the rest. Will send another rx for seroquel 25mg  tablet. Patient expressed understanding.

## 2012-11-01 ENCOUNTER — Telehealth: Payer: Self-pay | Admitting: *Deleted

## 2012-11-01 NOTE — Telephone Encounter (Addendum)
PA required for hydroxyzine.  Contacted insurance and they will fax form and will place in MD box. Plaecd in Dr. Sherron Flemings Cruz's  box for Dr. Gwenlyn Saran.

## 2012-11-03 ENCOUNTER — Telehealth: Payer: Self-pay | Admitting: Family Medicine

## 2012-11-03 ENCOUNTER — Inpatient Hospital Stay (HOSPITAL_COMMUNITY): Admission: RE | Admit: 2012-11-03 | Payer: Medicare Other | Source: Ambulatory Visit | Admitting: Orthopedic Surgery

## 2012-11-03 ENCOUNTER — Ambulatory Visit: Payer: Medicare Other | Admitting: Family Medicine

## 2012-11-03 ENCOUNTER — Encounter (HOSPITAL_COMMUNITY): Admission: RE | Payer: Self-pay | Source: Ambulatory Visit

## 2012-11-03 SURGERY — CONVERSION, PREVIOUS HIP SURGERY, TO TOTAL HIP ARTHROPLASTY
Anesthesia: Choice | Site: Hip | Laterality: Right

## 2012-11-03 NOTE — Telephone Encounter (Signed)
I am caring for Dr. Gwenlyn Saran box as of 3/13. I have filled out form and given to Myrlene Broker, RN>

## 2012-11-03 NOTE — Telephone Encounter (Signed)
Entered in error

## 2012-11-04 NOTE — Telephone Encounter (Signed)
Faxed to insurance

## 2012-11-15 ENCOUNTER — Other Ambulatory Visit: Payer: Self-pay | Admitting: Family Medicine

## 2012-11-18 ENCOUNTER — Other Ambulatory Visit (HOSPITAL_COMMUNITY): Payer: Medicare Other

## 2012-11-21 ENCOUNTER — Other Ambulatory Visit: Payer: Self-pay | Admitting: Family Medicine

## 2012-12-01 ENCOUNTER — Ambulatory Visit: Payer: Medicare Other | Admitting: Family Medicine

## 2012-12-06 ENCOUNTER — Encounter: Payer: Self-pay | Admitting: Family Medicine

## 2012-12-06 ENCOUNTER — Ambulatory Visit: Payer: Medicare Other | Admitting: Family Medicine

## 2012-12-07 NOTE — Telephone Encounter (Signed)
error 

## 2012-12-08 ENCOUNTER — Ambulatory Visit: Payer: Medicare Other | Admitting: Family Medicine

## 2012-12-14 ENCOUNTER — Telehealth: Payer: Self-pay | Admitting: *Deleted

## 2012-12-14 NOTE — Telephone Encounter (Signed)
If Referral approved please call Pam at [facility] @ [phone].Senaya Dicenso, Virgel Bouquet

## 2012-12-14 NOTE — Telephone Encounter (Signed)
Pt repeat ly goes to this office for second opinions.She was seen by Dr Charlann Boxer in January from [facility] again for a 2nd opinion now requesting a ref to Dr Shon Baton at [facility]  the same office for a second opinion.Since patient feels more comfortable with this office if we could ref her here first if needed in future.Thank You. Wendy [name], Wendy [name]

## 2012-12-15 ENCOUNTER — Other Ambulatory Visit (HOSPITAL_COMMUNITY): Payer: Medicare Other

## 2012-12-15 ENCOUNTER — Ambulatory Visit: Payer: Medicare Other | Admitting: Family Medicine

## 2012-12-16 ENCOUNTER — Encounter: Payer: Self-pay | Admitting: Family Medicine

## 2012-12-16 ENCOUNTER — Other Ambulatory Visit: Payer: Self-pay | Admitting: *Deleted

## 2012-12-16 ENCOUNTER — Ambulatory Visit (HOSPITAL_BASED_OUTPATIENT_CLINIC_OR_DEPARTMENT_OTHER): Payer: Medicare Other | Admitting: Family Medicine

## 2012-12-16 VITALS — BP 130/84 | HR 93 | Temp 99.1°F | Ht 62.0 in | Wt 199.0 lb

## 2012-12-16 DIAGNOSIS — M25559 Pain in unspecified hip: Secondary | ICD-10-CM | POA: Diagnosis not present

## 2012-12-16 DIAGNOSIS — M549 Dorsalgia, unspecified: Secondary | ICD-10-CM | POA: Diagnosis not present

## 2012-12-16 DIAGNOSIS — I10 Essential (primary) hypertension: Secondary | ICD-10-CM

## 2012-12-16 DIAGNOSIS — M25551 Pain in right hip: Secondary | ICD-10-CM

## 2012-12-16 MED ORDER — CLONAZEPAM 1 MG PO TABS: 1.0000 mg | ORAL_TABLET | Freq: Three times a day (TID) | ORAL | Status: DC | PRN

## 2012-12-16 MED ORDER — OXYCODONE HCL 10 MG PO TABS: 10.0000 mg | ORAL_TABLET | Freq: Three times a day (TID) | ORAL | Status: DC | PRN

## 2012-12-16 MED ORDER — HYDROXYZINE HCL 25 MG PO TABS: 25.0000 mg | ORAL_TABLET | Freq: Four times a day (QID) | ORAL | Status: DC | PRN

## 2012-12-16 MED ORDER — CARVEDILOL 12.5 MG PO TABS: 12.5000 mg | ORAL_TABLET | Freq: Two times a day (BID) | ORAL | Status: DC

## 2012-12-16 NOTE — Assessment & Plan Note (Signed)
No acute issues for this. She has appointment scheduled in May to attempt to have surgery again. Refill OxyContin.

## 2012-12-16 NOTE — Assessment & Plan Note (Addendum)
Refill for oxycodone today. No red flags or acute issues with this. She has followup next month with her PCP. Recommended weight loss and continued exercise as well. She is planning on following up with orthopedics for her back as well as for her hip. I updated her overview with findings from CT scan of pelvis in October 2013 which showed compression fracture of T12

## 2012-12-16 NOTE — Patient Instructions (Signed)
It was good to see you today.  Med refills provided.

## 2012-12-16 NOTE — Progress Notes (Signed)
Subjective:    Wendy [name] is a 52 y.o. female who presents to [facility] today with complaints of back pain and hip pain:  1.  Back pain:  Describes aching pain in lumbar region of back, worse when standing or doing any activities for prolonged period of time.  Not worse on left versus right side. Pain is 9 / 10 at worst, currently 6/10 not relieved with anything except Oxycodone, which just diminishes but does not totally alleviate pain.  Does little for exercise.  Extensive history of back pain including compression fractures.  No acute exacerbations.  No numbness or paresthesias to bilateral lower extremities.  No LE weakness or changes in gait.  No fevers or chills.  No incontinence of bladder or bowel.     2.  Hip pain:  Present for one to 2 years. Patient has previously been scheduled for hip surgery but this was put on hold secondary to accidental death of her daughter recently. She has a followup appointment with orthopedics in May and plans to schedule her surgery at that time. She describes pain medicine right groin which is worse with ambulation and prolonged standing. This is also mostly relieved with oxycodone usage. No acute exacerbations or changes in his pain.  Also needs refills for several of her other chronic medications   The following portions of the patient's history were reviewed and updated as appropriate: allergies, current medications, past medical history, family and social history, and problem list. Patient is a nonsmoker.    PMH reviewed.  Past Medical History  Diagnosis Date  . Cirrhosis   . Hiatal hernia   . Esophageal stricture   . Anxiety disorder   . 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 Hepatitis C, chronic   . History of alcohol abuse   . Depression   . History of hip fracture   . Allergic rhinitis   . HTN (hypertension)   . Anxiety   . Blood transfusion   . Cataract     MILD  . GERD (gastroesophageal reflux disease)   .  Osteoporosis   . Ulcer   . Arthritis   . Blood transfusion without reported diagnosis 2010 & 1976  . 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 Orif hip fracture  2010    right x2  . Upper gastrointestinal endoscopy  08/05/2007    esophageal ring, hiatal hernia, portal gastropathy  . Splenectomy      age 89  . Burr hole for subdural hematoma    . Upper gastrointestinal endoscopy  10/14/2011    Medications reviewed. Current Outpatient Prescriptions  Medication Sig Dispense Refill  . alendronate (FOSAMAX) 70 MG tablet Take 70 mg by mouth every Thursday. Take with a full glass of water on an empty stomach.      . calcium-vitamin D (OSCAL-500) 500-400 MG-UNIT per tablet 2 tablets 2 (two) times daily.       . carboxymethylcellulose (REFRESH) 1 % ophthalmic solution Apply 1 drop to eye 3 (three) times daily.      . carvedilol (COREG) 12.5 MG tablet Take 1 tablet (12.5 mg total) by mouth 2 (two) times daily with a meal.  60 tablet  3  . clonazePAM (KLONOPIN) 1 MG tablet Take 1 tablet (1 mg total) by mouth 3 (three) times daily as needed for anxiety.  90 tablet  0  . cycloSPORINE (  RESTASIS) 0.05 % ophthalmic emulsion Place 1 drop into both eyes 2 (two) times daily.  0.4 mL  2  . hydrOXYzine (ATARAX/VISTARIL) 25 MG tablet Take 25 mg by mouth 4 (four) times daily as needed for itching.      . hydrOXYzine (ATARAX/VISTARIL) 25 MG tablet TAKE 1 TABLET BY MOUTH 4 TIMES A DAY  120 tablet  3  . hydrOXYzine (ATARAX/VISTARIL) 25 MG tablet TAKE 1 TABLET BY MOUTH 4 TIMES A DAY  120 tablet  3  . lactulose (CHRONULAC) 10 GM/15ML solution Take 10 g by mouth 2 (two) times daily as needed (for constipation).      Marland Kitchen loratadine (CLARITIN) 10 MG tablet Take 1 tablet (10 mg total) by mouth daily.  30 tablet  11  . methocarbamol (ROBAXIN) 500 MG tablet TAKE 1 TABLET 3 TIMES A DAY AS NEEDED FOR MUSCLE SPASM  90 tablet  0  . Olopatadine  HCl (PATADAY) 0.2 % SOLN Apply 1 drop to eye daily.  1 Bottle  5  . omeprazole (PRILOSEC) 20 MG capsule Take one tablet 30-60 minutes before breakfast and supper  20 capsule  0  . omeprazole (PRILOSEC) 20 MG capsule Take 1 capsule by mouth twice daily.  60 capsule  3  . Oxycodone HCl 10 MG TABS Take 1 tablet (10 mg total) by mouth 3 (three) times daily as needed (for pain).  90 tablet  0  . Prenatal 28-0.8 MG TABS Take 1 tablet by mouth daily.      . QUEtiapine (SEROQUEL) 25 MG tablet Take 1 tablet (25 mg total) by mouth at bedtime.  1 tablet  0  . QUEtiapine (SEROQUEL) 25 MG tablet Take 1 tablet (25 mg total) by mouth at bedtime. Take 1 tablet on day 1. 50mg  on day 2, 100mg  on day 3, 150mg  on day 4, 200mg  on day5  10 tablet  0  . QUEtiapine (SEROQUEL) 50 MG tablet Take 1 tablet (50 mg total) by mouth at bedtime.  60 tablet  1  . varenicline (CHANTIX CONTINUING MONTH PAK) 1 MG tablet Take 1 tablet (1 mg total) by mouth 2 (two) times daily.  60 tablet  3  . varenicline (CHANTIX) 0.5 MG tablet Take 1 tablet (0.5 mg total) by mouth 2 (two) times daily. Days 1-3: 0.5 mg once daily; Days 4-7: 0.5 mg twice daily  10 tablet  0  . XIFAXAN 550 MG TABS TAKE 1 TABLET (550 MG TOTAL) BY MOUTH 2 (TWO) TIMES DAILY.  60 tablet  3   No current facility-administered medications for this visit.    ROS as above otherwise neg.  No chest pain, palpitations, SOB, Fever, Chills, Abd pain, N/V/D.   Objective:   Physical Exam BP 130/84  Pulse 93  Temp(Src) 99.1 F (37.3 C) (Oral)  Ht 5\' 2"  (1.575 m)  Wt 199 lb (90.266 kg)  BMI 36.39 kg/m2 Gen:  Alert, cooperative patient who appears stated age in no acute distress.  Vital signs reviewed. HEENT: EOMI,  MMM Cardiac:  Regular rate and rhythm without murmur auscultated.  Good S1/S2. Pulm:  Clear to auscultation bilaterally with good air movement.  No wheezes or rales noted.   Back:  Normal skin, Spine with normal alignment and no deformity.  No tenderness to  vertebral process palpation.  Paraspinous muscles are tender bilateral lumbar region with minimal spasm noted.   Range of motion is full at neck and decreased in lumbar sacral regions.  Straight leg raise is difficult to assess  due to poor cooperation secondary to back pain and patient body habitus. Left hip with fair range of motion internal and external rotation. Right hip with pain with upon internal rotation of the hip. Neuro:  Sensation and motor function 5/5 bilateral lower extremities.  Patellar and Achilles  DTR's +2 patellar BL    No results found for this or any previous visit (from the past 72 hour(s)).

## 2012-12-19 MED ORDER — QUETIAPINE FUMARATE 50 MG PO TABS: 200.0000 mg | ORAL_TABLET | Freq: Every day | ORAL | Status: DC

## 2012-12-23 NOTE — Telephone Encounter (Addendum)
Called Pam at [facility] on 12/20/12 and left message on voicemail giving referral for second opinion with Dr. Shon Baton at [facility] if patient wants this.   Marena Chancy, PGY-2 Family Medicine Resident

## 2012-12-29 ENCOUNTER — Ambulatory Visit (HOSPITAL_BASED_OUTPATIENT_CLINIC_OR_DEPARTMENT_OTHER): Payer: Medicare Other

## 2012-12-29 ENCOUNTER — Ambulatory Visit (HOSPITAL_COMMUNITY): Payer: Medicare Other | Attending: Family Medicine

## 2012-12-29 ENCOUNTER — Encounter: Payer: Self-pay | Admitting: Family Medicine

## 2012-12-29 VITALS — BP 131/92 | Ht 62.0 in | Wt 201.0 lb

## 2012-12-29 DIAGNOSIS — I10 Essential (primary) hypertension: Secondary | ICD-10-CM

## 2012-12-29 DIAGNOSIS — Z01818 Encounter for other preprocedural examination: Secondary | ICD-10-CM | POA: Diagnosis not present

## 2012-12-29 DIAGNOSIS — K7460 Unspecified cirrhosis of liver: Secondary | ICD-10-CM | POA: Diagnosis not present

## 2012-12-29 DIAGNOSIS — Z01810 Encounter for preprocedural cardiovascular examination: Secondary | ICD-10-CM | POA: Diagnosis not present

## 2012-12-29 DIAGNOSIS — R0989 Other specified symptoms and signs involving the circulatory and respiratory systems: Secondary | ICD-10-CM

## 2012-12-29 MED ORDER — ATROPINE SULFATE 0.1 MG/ML IJ SOLN
0.5000 mg | Freq: Once | INTRAMUSCULAR | Status: AC
  Administered 2012-12-29: 0.5 mg via INTRAVENOUS

## 2012-12-29 MED ORDER — DOBUTAMINE HCL 250 MG/20ML IV SOLN
40.0000 ug/kg | Freq: Once | INTRAVENOUS | Status: AC
  Administered 2012-12-29: 40 ug/kg/min via INTRAVENOUS

## 2012-12-29 NOTE — Progress Notes (Signed)
Echocardiogram performed.  

## 2013-01-03 ENCOUNTER — Telehealth: Payer: Self-pay | Admitting: Family Medicine

## 2013-01-03 NOTE — Telephone Encounter (Signed)
Patient is calling to find out what is going on with her stress test while Dr. Gwenlyn Saran is out.

## 2013-01-03 NOTE — Telephone Encounter (Signed)
Called pt and advised to call [facility] in re: stress test (because it was done there). Pt agreed and said, that she has called them already and the doctor who did the test at [facility] will be back on Thursday. Lorenda Hatchet, Renato Battles

## 2013-01-03 NOTE — Telephone Encounter (Signed)
Called pt. She is concerned about her stress test. I told her, that I would ask Dr.Neal about her results. She has an upcoming appt with Dr.Walden on 01/17/13,  Because her PCP is not available. I told the pt that I would call her back, after Dr.Neal reviews stress test. Pt agreed. Lorenda Hatchet, Renato Battles

## 2013-01-10 ENCOUNTER — Telehealth: Payer: Self-pay | Admitting: Family Medicine

## 2013-01-10 NOTE — Telephone Encounter (Signed)
Pt called and had numerous concerns regarding her stress test.  I instructed patient to make sure she keeps her stress test follow up appt with Dr. Gwendolyn Grant next week and that the appt is to discuss any further steps if necessary.  Pt. Seemed anxious and was very calm after talking with her and verbalized understanding of keeping appt to discuss,. Lenardo Westwood L

## 2013-01-10 NOTE — Telephone Encounter (Signed)
Pt had stress test last week and she said that she "didn't pass or didn't fail" and that there was another test that she is going to need - she needs to know what to do at this point.  pls advise

## 2013-01-10 NOTE — Telephone Encounter (Signed)
Will discuss at visit.  This is too in-depth a topic to do over the phone.  Thanks for the help, Trey Paula

## 2013-01-17 ENCOUNTER — Ambulatory Visit: Payer: Medicare Other | Admitting: Family Medicine

## 2013-01-19 ENCOUNTER — Encounter: Payer: Self-pay | Admitting: Family Medicine

## 2013-01-19 ENCOUNTER — Ambulatory Visit (INDEPENDENT_AMBULATORY_CARE_PROVIDER_SITE_OTHER): Payer: Medicare Other | Admitting: Family Medicine

## 2013-01-19 VITALS — BP 152/103 | HR 119 | Temp 98.2°F | Ht 62.0 in | Wt 204.0 lb

## 2013-01-19 DIAGNOSIS — M549 Dorsalgia, unspecified: Secondary | ICD-10-CM

## 2013-01-19 DIAGNOSIS — R9439 Abnormal result of other cardiovascular function study: Secondary | ICD-10-CM | POA: Diagnosis not present

## 2013-01-19 DIAGNOSIS — F4001 Agoraphobia with panic disorder: Secondary | ICD-10-CM

## 2013-01-19 MED ORDER — CLONAZEPAM 1 MG PO TABS: 1.0000 mg | ORAL_TABLET | Freq: Three times a day (TID) | ORAL | Status: DC | PRN

## 2013-01-19 MED ORDER — OXYCODONE HCL 10 MG PO TABS: 10.0000 mg | ORAL_TABLET | Freq: Three times a day (TID) | ORAL | Status: DC | PRN

## 2013-01-19 NOTE — Patient Instructions (Signed)
We will get you in to see a cardiologist. I have refilled your pain and anxiety medications. Please go back to taking your carvedilol both morning and evening.  Schedule a nurse visit the beginning of next week for a blood pressure check.

## 2013-01-26 ENCOUNTER — Encounter: Payer: Self-pay | Admitting: Family Medicine

## 2013-01-26 NOTE — Assessment & Plan Note (Signed)
Refill provided

## 2013-01-26 NOTE — Assessment & Plan Note (Signed)
Given atypical chest pain that was concerning enough to merit a stress echo, feel that, in the presence of a non-diagnostic echo, consultation with cardiology is recommended.  Have discussed with patient that she does not need to make any specific activity modifications but should not start an intense exercise program or increase her daily exercises any until seen by cards.

## 2013-01-26 NOTE — Assessment & Plan Note (Signed)
Refill provided, f/u PCP in 1 month

## 2013-01-26 NOTE — Progress Notes (Signed)
Patient ID: CHANETTA MOOSMAN, female   DOB: Dec 25, 1960, 52 y.o.   MRN: [id number] Subjective: The patient is a 52 y.o. year old female who presents today for f/u and chronic med refills.  1. Anxiety: Pt reports that anxiety is moderately well controlled.  No recent panic attacks although she does have some increased anxiety related to stress echo.  Taking Klonopin TID per PCP's rx.  Needs refill.  2. Chronic pain:  Chronic back pain, PCP has started oxycodon to help with exercise.  Pt reports is working [employer].  Getting some exercise.  Still has some chronic pain but better than before starting.  Needs refill.  3. Stress echo f/u: Pt has had stress echo that was non-diagnostic.  No obvious signs of ischemia but unable to complete due to elevated bp and not reaching target HR.  Denies any current cp/sob/doe, new leg swelling  Patient's past medical, social, and family history were reviewed and updated as appropriate. History  Substance Use Topics  . Smoking status: Current Some Day Smoker -- 0.25 packs/day    Types: Cigarettes    Last Attempt to Quit: 12/23/2008  . Smokeless tobacco: Never Used  . Alcohol Use: No     Comment: goes to AA   Objective:  Filed Vitals:   01/19/13 0906  BP: 152/103  Pulse: 119  Temp: 98.2 F (36.8 C)   Gen: NAD CV: RRR Resp: CTABL Ext: <2 sec cap refill  Assessment/Plan:  Please also see individual problems in problem list for problem-specific plans.

## 2013-01-27 ENCOUNTER — Other Ambulatory Visit: Payer: Self-pay | Admitting: Family Medicine

## 2013-02-01 ENCOUNTER — Encounter: Payer: Self-pay | Admitting: Cardiology

## 2013-02-01 ENCOUNTER — Ambulatory Visit (INDEPENDENT_AMBULATORY_CARE_PROVIDER_SITE_OTHER): Payer: Medicare Other | Admitting: Cardiology

## 2013-02-01 VITALS — BP 114/70 | HR 100 | Ht 62.0 in | Wt 205.0 lb

## 2013-02-01 DIAGNOSIS — R0600 Dyspnea, unspecified: Secondary | ICD-10-CM

## 2013-02-01 DIAGNOSIS — F17200 Nicotine dependence, unspecified, uncomplicated: Secondary | ICD-10-CM | POA: Diagnosis not present

## 2013-02-01 DIAGNOSIS — Z720 Tobacco use: Secondary | ICD-10-CM

## 2013-02-01 DIAGNOSIS — R0609 Other forms of dyspnea: Secondary | ICD-10-CM

## 2013-02-01 DIAGNOSIS — R9439 Abnormal result of other cardiovascular function study: Secondary | ICD-10-CM | POA: Diagnosis not present

## 2013-02-01 DIAGNOSIS — R0989 Other specified symptoms and signs involving the circulatory and respiratory systems: Secondary | ICD-10-CM

## 2013-02-01 NOTE — Patient Instructions (Addendum)
Your physician has requested that you have a lexiscan myoview. For further information please visit [email or web address] Please follow instruction sheet, as given.  Your physician recommends that you schedule a follow-up appointment as needed with Dr Shirlee Latch.

## 2013-02-03 DIAGNOSIS — R0600 Dyspnea, unspecified: Secondary | ICD-10-CM | POA: Insufficient documentation

## 2013-02-03 NOTE — Progress Notes (Signed)
Patient ID: Wendy [name], female   DOB: [birth year], 52 y.o.   MRN: [id number] PCP: Dr. Louanne Belton  52 yo with history of osteoarthritis needing a right hip replacement presents for cardiology evaluation.  She is not very mobile due to her hip pain.  She uses a cane or a walker to get around her house slowly.  She could not climb up a flight of stairs. She also says that she is short of breath after walking a short distance.  She thinks this is because of the effort required for her to walk given her orthopedic problems. No chest pain.  No cardiac history.  She does not take ASA because of her cirrhosis.  A hepatologist at [facility] said to avoid aspirin.  She was set up for a dobutamine stress echo prior to hip surgery, but this study was submaximal.  ECG: NSR, inferior T wave inversions  PMH: 1. OA  2. Anxiety 3. Chronic low back pain 4. HTN 5. HCV with cirrhosis. 6. DSE (5/14): Patient only reached 71% of MPHR but wall motion was normal at stress.   SH: Smokes 1/4 ppd.  Lives in [place].    FH: Mother with MIs, 1st in her 100s.    ROS: All systems reviewed and negative except as per HPI.   Current Outpatient Prescriptions  Medication Sig Dispense Refill  . alendronate (FOSAMAX) 70 MG tablet Take 70 mg by mouth every Thursday. Take with a full glass of water on an empty stomach.      . calcium-vitamin D (OSCAL-500) 500-400 MG-UNIT per tablet 2 tablets 2 (two) times daily.       . carboxymethylcellulose (REFRESH) 1 % ophthalmic solution Apply 1 drop to eye 3 (three) times daily.      . carvedilol (COREG) 12.5 MG tablet Take 1 tablet (12.5 mg total) by mouth 2 (two) times daily with a meal.  60 tablet  3  . clonazePAM (KLONOPIN) 1 MG tablet Take 1 tablet (1 mg total) by mouth 3 (three) times daily as needed for anxiety.  90 tablet  0  . cycloSPORINE (RESTASIS) 0.05 % ophthalmic emulsion Place 1 drop into both eyes 2 (two) times daily.  0.4 mL  2  . hydrOXYzine (ATARAX/VISTARIL) 25 MG tablet  Take 1 tablet (25 mg total) by mouth 4 (four) times daily as needed for itching.  30 tablet  3  . lactulose (CHRONULAC) 10 GM/15ML solution Take 10 g by mouth 2 (two) times daily as needed (for constipation).      Marland Kitchen loratadine (CLARITIN) 10 MG tablet Take 1 tablet (10 mg total) by mouth daily.  30 tablet  11  . methocarbamol (ROBAXIN) 500 MG tablet TAKE 1 TABLET 3 TIMES A DAY AS NEEDED FOR MUSCLE SPASM  90 tablet  0  . Olopatadine HCl (PATADAY) 0.2 % SOLN Apply 1 drop to eye daily.  1 Bottle  5  . Oxycodone HCl 10 MG TABS Take 1 tablet (10 mg total) by mouth 3 (three) times daily as needed (for pain).  90 tablet  0  . Prenatal 28-0.8 MG TABS Take 1 tablet by mouth daily.      . QUEtiapine (SEROQUEL) 25 MG tablet Take 1 tablet (25 mg total) by mouth at bedtime. Take 1 tablet on day 1. 50mg  on day 2, 100mg  on day 3, 150mg  on day 4, 200mg  on day5  10 tablet  0  . QUEtiapine (SEROQUEL) 50 MG tablet Take 4 tablets (200 mg total) by mouth at  bedtime.  120 tablet  1  . XIFAXAN 550 MG TABS TAKE 1 TABLET (550 MG TOTAL) BY MOUTH 2 (TWO) TIMES DAILY.  60 tablet  3   No current facility-administered medications for this visit.    BP 114/70  Pulse 100  Ht 5\' 2"  (1.575 m)  Wt 205 lb (92.987 kg)  BMI 37.49 kg/m2 General: NAD, obese.  Neck: No JVD, no thyromegaly or thyroid nodule.  Lungs: Clear to auscultation bilaterally with normal respiratory effort. CV: Nondisplaced PMI.  Heart regular S1/S2, no S3/S4, no murmur.  1+ ankle edema.  No carotid bruit.  Normal pedal pulses.  Abdomen: Soft, nontender, no hepatosplenomegaly, no distention.  Skin: Intact without lesions or rashes.  Neurologic: Alert and oriented x 3.  Psych: Normal affect. Extremities: No clubbing or cyanosis.  HEENT: Normal.   Assessment/Plan:  1. Pre-operative evaluation: Given the patient's poor exercise tolerance and exertional dyspnea, I think that it would be worthwhile doing a pre-operative Lexiscan-Sestamibi.  She had a DSE but  the study was submaximal (only 71% of his MPHR).   2. Coronary disease risk: She will need lipids followed.  For now, would hold off on aspirin given hepatologist's recommendations.  3. Smoking: I urged her to quit.   Marca Ancona 02/03/2013

## 2013-02-09 ENCOUNTER — Encounter (HOSPITAL_COMMUNITY): Payer: Medicare Other

## 2013-02-15 ENCOUNTER — Ambulatory Visit: Payer: Medicare Other | Admitting: Family Medicine

## 2013-02-22 ENCOUNTER — Other Ambulatory Visit: Payer: Self-pay | Admitting: Family Medicine

## 2013-02-22 MED ORDER — QUETIAPINE FUMARATE 50 MG PO TABS: 200.0000 mg | ORAL_TABLET | Freq: Every day | ORAL | Status: DC

## 2013-02-22 NOTE — Telephone Encounter (Signed)
Patient is calling for a refill on Seroquel.  [facility] [address] was to send a refill request yesterday and today, but I see not record of that.  The patient is out of this medication.

## 2013-02-22 NOTE — Telephone Encounter (Signed)
Refilled rx for seroquel 200mg  [facility] [address].   Marena Chancy, PGY-2 Family Medicine Resident

## 2013-02-22 NOTE — Telephone Encounter (Signed)
Will forward to Dr Losq 

## 2013-02-27 ENCOUNTER — Telehealth: Payer: Self-pay | Admitting: Psychology

## 2013-02-27 NOTE — Telephone Encounter (Signed)
Wendy [name] left a VM canceling her appointment today secondary to health and personal issues.  Called her back and she said she would call me when she was able to reschedule.

## 2013-03-01 ENCOUNTER — Ambulatory Visit: Payer: Medicare Other | Admitting: Family Medicine

## 2013-03-03 ENCOUNTER — Ambulatory Visit: Payer: Medicare Other | Admitting: Family Medicine

## 2013-03-03 NOTE — Progress Notes (Signed)
  Subjective:    Patient ID: Wendy [name], female    DOB: 08/17/1961, 52 y.o.   MRN: [id number]  HPI  Chart opened in error. JB  Review of Systems     Objective:   Physical Exam        Assessment & Plan:

## 2013-03-03 NOTE — Progress Notes (Deleted)
Subjective:     Patient ID: Wendy [name], female   DOB: 12/07/1960, 52 y.o.   MRN: [id number]  HPI   Review of Systems     Objective:   Physical Exam     Assessment:     ***    Plan:     ***

## 2013-03-06 ENCOUNTER — Telehealth: Payer: Self-pay | Admitting: Family Medicine

## 2013-03-06 MED ORDER — CLONAZEPAM 1 MG PO TABS: 1.0000 mg | ORAL_TABLET | Freq: Three times a day (TID) | ORAL | Status: DC | PRN

## 2013-03-06 NOTE — Telephone Encounter (Signed)
Will fwd to MD.  Emery Dupuy L, CMA  

## 2013-03-06 NOTE — Telephone Encounter (Signed)
Pt has an appointment on 7/23 for FU and meds refill. She is will be out of clonazepam 1 mg before then and would like enough medication to hold her until her appointment JW

## 2013-03-06 NOTE — Telephone Encounter (Signed)
Rx for 8 days worth of klonopin 1mg  tid/prn until next appointment on 03/15/13: 24 tablets, no refills.  Marena Chancy, PGY-3 Family Medicine Resident

## 2013-03-07 NOTE — Telephone Encounter (Signed)
Pt notified of Rx up front and of MD message.  Dillen Belmontes, Darlyne Russian, CMA

## 2013-03-14 DIAGNOSIS — S32009A Unspecified fracture of unspecified lumbar vertebra, initial encounter for closed fracture: Secondary | ICD-10-CM | POA: Diagnosis not present

## 2013-03-14 DIAGNOSIS — S22009A Unspecified fracture of unspecified thoracic vertebra, initial encounter for closed fracture: Secondary | ICD-10-CM | POA: Diagnosis not present

## 2013-03-15 ENCOUNTER — Telehealth: Payer: Self-pay | Admitting: Family Medicine

## 2013-03-15 ENCOUNTER — Ambulatory Visit (INDEPENDENT_AMBULATORY_CARE_PROVIDER_SITE_OTHER): Payer: Medicare Other | Admitting: Family Medicine

## 2013-03-15 VITALS — BP 105/67 | HR 81 | Ht 62.0 in | Wt 214.0 lb

## 2013-03-15 DIAGNOSIS — M25559 Pain in unspecified hip: Secondary | ICD-10-CM

## 2013-03-15 DIAGNOSIS — Z01818 Encounter for other preprocedural examination: Secondary | ICD-10-CM | POA: Diagnosis not present

## 2013-03-15 DIAGNOSIS — M25551 Pain in right hip: Secondary | ICD-10-CM

## 2013-03-15 DIAGNOSIS — F329 Major depressive disorder, single episode, unspecified: Secondary | ICD-10-CM

## 2013-03-15 DIAGNOSIS — M549 Dorsalgia, unspecified: Secondary | ICD-10-CM

## 2013-03-15 DIAGNOSIS — I10 Essential (primary) hypertension: Secondary | ICD-10-CM | POA: Diagnosis not present

## 2013-03-15 MED ORDER — CLONAZEPAM 1 MG PO TABS: 1.0000 mg | ORAL_TABLET | Freq: Three times a day (TID) | ORAL | Status: DC | PRN

## 2013-03-15 MED ORDER — CYCLOBENZAPRINE HCL 10 MG PO TABS: 10.0000 mg | ORAL_TABLET | Freq: Three times a day (TID) | ORAL | Status: DC | PRN

## 2013-03-15 MED ORDER — QUETIAPINE FUMARATE 200 MG PO TABS: 200.0000 mg | ORAL_TABLET | Freq: Every day | ORAL | Status: DC

## 2013-03-15 MED ORDER — OXYCODONE HCL 10 MG PO TABS: 10.0000 mg | ORAL_TABLET | Freq: Three times a day (TID) | ORAL | Status: DC | PRN

## 2013-03-15 MED ORDER — LORATADINE 10 MG PO TABS: 10.0000 mg | ORAL_TABLET | Freq: Every day | ORAL | Status: DC

## 2013-03-15 NOTE — Patient Instructions (Addendum)
See you back in 1 month. Try and get appointment with Dr. Pascal Lux at [facility].

## 2013-03-15 NOTE — Telephone Encounter (Signed)
Patient was seen today, states that her & Dr. Gwenlyn Saran began to discussed her getting a stress test but the conversation got sidetracked. Would like to know about getting the stress test done.

## 2013-03-16 ENCOUNTER — Encounter: Payer: Self-pay | Admitting: Family Medicine

## 2013-03-16 NOTE — Assessment & Plan Note (Signed)
Depression is significant with phq9 of 22. No SI/HI. Thought that there could be a component of bipolar disorder based on high MDQ and h/o bipolar in the family. Seroquel however, doesn't appear to be helping mood. It is helping her for sleep. Encouraged her to follow up with Dr. Pascal Lux and the mood clinic to get further evaluated. Zyprexa and prozac combination may be helpful in setting of depression as well as anxiety, but if zyprexa is started, this will have to be closely monitored in the setting of underlying liver disease, as zyprexa has been reported to cause cases of hepatitis and liver injury.

## 2013-03-16 NOTE — Assessment & Plan Note (Addendum)
According to patient's account, she has a new compression fracture which likely accounts for her low back pain. She has MRI scheduled for next week.  Will refill oxycodone 10 tid/prn 90 tabs no refills. Continue fosamax in setting of osteoporosis. Will need to obtain vitamin D level at next visit.  Patient feeling that methocarbamol is not working as well and would like to try something different. Will try switching to flexeril 10 tid/prn.

## 2013-03-16 NOTE — Telephone Encounter (Signed)
Will fwd to MD.  Aspen Deterding L, CMA  

## 2013-03-16 NOTE — Progress Notes (Signed)
Patient ID: JA PISTOLE    DOB: March 25, 1961, 52 y.o.   MRN: [id number] --- Subjective:  [place] is a 52 y.o.female who presents for follow up.  Concerns include: - follow up on hip/back pain: day prior to office visit, had severe low back pain, unable to bend or walk more than a few feet. She was seen by Dr. Shon Baton at [facility] orthopedics and was found to have new compression fracture in the lower back. He prescribed an "intranasal medicine".  Hip pain appears to be better compared to before.   - follow up on mood: takes seroquel 200mg  nightly. Helps her sleep, but she has not seen any change in her mood or anxiety. She still reports trouble going to her doctors' appointments due to the anxiety. She does have a white board where she writes encouraging thoughts to help her make her appointments.  She is still grieving the death of her daughter and sad that she has been unable to see her grand-daughter. She saves keepsakes from her daughter's childhood in hopes to share them with her grand-daughter at some point.  Phq9: 22 with very difficult  ROS: see HPI Past Medical History: reviewed and updated medications and allergies. Social History: Tobacco: 1/4 pack per day  Objective: Filed Vitals:   03/15/13 1406  BP: 105/67  Pulse: 81    Physical Examination:   General appearance - alert, well appearing, and in no distress Chest - clear to auscultation, no wheezes, rales or rhonchi, symmetric air entry Heart - normal rate, regular rhythm, normal S1, S2, no murmurs, rubs, clicks or gallops MSK - no tenderness along lumbar spine or paraspinal muscles, no tenderness along thigh, 4+/5 strength with hip flexion, knee flexion and extension bilaterally.

## 2013-03-16 NOTE — Telephone Encounter (Signed)
Called patient and left message stating that I thought it would be a good idea for her to get the lexiscan sestamibi that was recommended by her cardiologist. The dobutamine stress test did not achieve the expected maximum predicted heart rate and Dr. Shirlee Latch therefore recommends lexiscan-sestamibi. Will send her info about the test through the mail.  She can call back the office if she has any further questions.   Marena Chancy, PGY-3 Family Medicine Resident

## 2013-03-16 NOTE — Assessment & Plan Note (Signed)
Patient to follow up with hip surgeon to assess possibility for surgery. Continue pain control with oxycodone

## 2013-03-16 NOTE — Assessment & Plan Note (Signed)
Patient hesitant to habe lexiscan sestamibi scan that is recommended by cardiology for preop assessment. Did not get a chance to discuss this at the office visit. Patient called back asking about this. I left message on her phone stating that I would recommend following Dr. Alford Highland recommendations.  Sent her additional info about lexisca through mail.

## 2013-03-17 ENCOUNTER — Telehealth: Payer: Self-pay | Admitting: Psychology

## 2013-03-17 NOTE — Telephone Encounter (Signed)
Kim called to talk about scheduling [facility].  She continues to struggle with agoraphobia and attending doctor's appointments are hard.  She was hoping to tag team her medical appointment with an appointment with me.  Able to schedule her for August 26th at 2:00 - right after her appointment with Dr. Gwenlyn Saran.

## 2013-03-21 DIAGNOSIS — S32009A Unspecified fracture of unspecified lumbar vertebra, initial encounter for closed fracture: Secondary | ICD-10-CM | POA: Diagnosis not present

## 2013-03-24 ENCOUNTER — Other Ambulatory Visit: Payer: Self-pay | Admitting: *Deleted

## 2013-03-25 MED ORDER — HYDROXYZINE HCL 25 MG PO TABS: 25.0000 mg | ORAL_TABLET | Freq: Four times a day (QID) | ORAL | Status: DC | PRN

## 2013-03-27 DIAGNOSIS — S32009A Unspecified fracture of unspecified lumbar vertebra, initial encounter for closed fracture: Secondary | ICD-10-CM | POA: Diagnosis not present

## 2013-04-03 ENCOUNTER — Other Ambulatory Visit: Payer: Self-pay | Admitting: Internal Medicine

## 2013-04-18 ENCOUNTER — Telehealth: Payer: Self-pay | Admitting: Psychology

## 2013-04-18 ENCOUNTER — Ambulatory Visit: Payer: Medicare Other | Admitting: Psychology

## 2013-04-18 ENCOUNTER — Ambulatory Visit: Payer: Medicare Other | Admitting: Family Medicine

## 2013-04-18 NOTE — Telephone Encounter (Signed)
Wendy [name] left a VM at 11:05 to cancel her 2:00 appointment secondary to not feeling well enough to come in.  I called her back and left a VM to discuss.

## 2013-04-27 ENCOUNTER — Encounter: Payer: Self-pay | Admitting: Family Medicine

## 2013-04-27 ENCOUNTER — Ambulatory Visit (INDEPENDENT_AMBULATORY_CARE_PROVIDER_SITE_OTHER): Payer: Medicare Other | Admitting: Family Medicine

## 2013-04-27 DIAGNOSIS — F4001 Agoraphobia with panic disorder: Secondary | ICD-10-CM

## 2013-04-27 DIAGNOSIS — M25551 Pain in right hip: Secondary | ICD-10-CM

## 2013-04-27 DIAGNOSIS — IMO0002 Reserved for concepts with insufficient information to code with codable children: Secondary | ICD-10-CM

## 2013-04-27 DIAGNOSIS — M899 Disorder of bone, unspecified: Secondary | ICD-10-CM | POA: Diagnosis not present

## 2013-04-27 DIAGNOSIS — M25559 Pain in unspecified hip: Secondary | ICD-10-CM | POA: Diagnosis not present

## 2013-04-27 DIAGNOSIS — M549 Dorsalgia, unspecified: Secondary | ICD-10-CM

## 2013-04-27 DIAGNOSIS — M4846XS Fatigue fracture of vertebra, lumbar region, sequela of fracture: Secondary | ICD-10-CM

## 2013-04-27 LAB — COMPREHENSIVE METABOLIC PANEL
ALT: 29 U/L (ref 0–35)
AST: 45 U/L — ABNORMAL HIGH (ref 0–37)
Alkaline Phosphatase: 183 U/L — ABNORMAL HIGH (ref 39–117)
Sodium: 137 mEq/L (ref 135–145)
Total Bilirubin: 1.2 mg/dL (ref 0.3–1.2)
Total Protein: 6.7 g/dL (ref 6.0–8.3)

## 2013-04-27 MED ORDER — CLONAZEPAM 1 MG PO TABS: 1.0000 mg | ORAL_TABLET | Freq: Three times a day (TID) | ORAL | Status: DC | PRN

## 2013-04-27 MED ORDER — OXYCODONE HCL 10 MG PO TABS: 10.0000 mg | ORAL_TABLET | Freq: Three times a day (TID) | ORAL | Status: DC | PRN

## 2013-04-27 NOTE — Patient Instructions (Addendum)
Follow up in 1 month with me.  For the lexiscan stress test, please call the cardiologist's office to get it set up.

## 2013-04-28 LAB — VITAMIN D 25 HYDROXY (VIT D DEFICIENCY, FRACTURES): Vit D, 25-Hydroxy: 35 ng/mL (ref 30–89)

## 2013-04-28 LAB — CBC
MCH: 39.9 pg — ABNORMAL HIGH (ref 26.0–34.0)
MCHC: 35.2 g/dL (ref 30.0–36.0)
Platelets: 170 10*3/uL (ref 150–400)
RDW: 15.7 % — ABNORMAL HIGH (ref 11.5–15.5)

## 2013-04-30 NOTE — Assessment & Plan Note (Signed)
Refilled clonazepam.

## 2013-04-30 NOTE — Progress Notes (Signed)
Patient ID: TANIEKA POWNALL    DOB: [birth year], 52 y.o.   MRN: [id number] --- Subjective:  Sunday is a 52 y.o.female who presents for follow up on chronic right hip pain, low back ain from compression fracture and medical evaluation for upcoming vertebroplasty and possible hip replacement.   - low back pain: acute from recently diagnosed compression fracture. She has been taking fosamax and percocet without significant help. She has days where she barely gets out of bed due to the pain. She denies any radiation to the legs. It is located in low mid back area. No new lower extremity weakness, urinary or fecal incontinence. Has been taking oxycodone 10mg  sometimes not at all during a day, sometimes 2-3 times daily.  Dr. Shon Baton plans on vertebroplasty and requests medical clearance. Patient has no exercise tolerance due to musculoskeletal pain. Difficult to assess functional capacity.  - right hip pain: stable, unchanged in nature. No new weakness.   ROS: see HPI Past Medical History: reviewed and updated medications and allergies. Social History: Tobacco: 1/4 pack per day  Objective: Filed Vitals:   04/27/13 1032  BP: 130/85  Pulse: 93    Physical Examination:   General appearance - alert, well appearing, and in no distress Chest - clear to auscultation, no wheezes, rales or rhonchi, symmetric air entry Heart - normal rate, regular rhythm, normal S1, S2, no murmurs Abdomen - soft, nontender, mildly distended MSK - tenderness to palpation along lumbar spine, no tenderness along thigh, 4/5 strength with hip flexion, knee flexion and extension bilaterally.

## 2013-04-30 NOTE — Assessment & Plan Note (Signed)
Patient thinking of right hip replacement. Patient has multiple co morbidities including: hepatitis C cirrhosis.  Patient needing lexiscan stress test since HR was suboptimal during dobutamine stress echo.  Patient to call [facility] cardiology to arrange lexiscan stress test.
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
## 2013-05-04 ENCOUNTER — Telehealth: Payer: Self-pay | Admitting: Family Medicine

## 2013-05-04 NOTE — Telephone Encounter (Signed)
Will fwd to Dr.Losq for review. .Wendy [name]  

## 2013-05-04 NOTE — Telephone Encounter (Signed)
Pt called because she said that Dr. Gwenlyn Saran was going to give her an referral for a MRI and she wanted to know if this was done and when the appointment will be. JW

## 2013-05-05 ENCOUNTER — Telehealth: Payer: Self-pay | Admitting: Family Medicine

## 2013-05-05 NOTE — Telephone Encounter (Signed)
Called patient back regarding referral for MRI. I left a message on her phone. I explained that she had an MRI done by ortho in July and so I don't think she needs another one at this point.  She can call the clinic back if she has anymore questions.   Marena Chancy, PGY-3 Family Medicine Resident

## 2013-05-08 ENCOUNTER — Telehealth: Payer: Self-pay | Admitting: Family Medicine

## 2013-05-08 NOTE — Telephone Encounter (Signed)
Called patient back and she was talking about the stress test for the cardiology clearance. I asked her if she could call the cardiology office to set it up, but she did not seem to think that she would be able to do that.  I will call the cardiology office tomorrow and see how we can arrange a lexiscan stress test for cardiac clearance for vertebroplasty as well as right hip replacement.  She expressed understanding and agreed with plan.  Marena Chancy, PGY-3 Family Medicine Resident

## 2013-05-08 NOTE — Telephone Encounter (Signed)
Called pt and I read Dr.Losq's message to her (see previous message) She reports, that another doctor wanted a stress test and mri for her before surgery. I told the pt, that these doctors should order their tests. Pt said, that it is confusing and she thought, that Dr.Losq discussed a stress test with her. I advised the pt to schedule an OV with Dr.Losq to discuss. Pt agreed. Lorenda Hatchet, Renato Battles

## 2013-05-08 NOTE — Telephone Encounter (Signed)
Pt return call and now wants a stress test and possibly other test. She is confused about what doctor needed what. She would like a call from Dr. Gwenlyn Saran to address these issues since she is have two risky surgeries. JW

## 2013-05-11 ENCOUNTER — Telehealth: Payer: Self-pay | Admitting: Family Medicine

## 2013-05-11 NOTE — Telephone Encounter (Signed)
[facility] cardiology to arrange lexiscan-sestamibi test. Was able to get appointment for September 25th at noon.  Called patient to let her know and she said that she would be able to make the appointment next week. Gave patient the number to call  to confirm where she needs to go for the test and what she needs to do prior to the test.  She expressed understanding and agreed with plan.   Marena Chancy, PGY-3 Family Medicine Resident

## 2013-05-17 ENCOUNTER — Telehealth: Payer: Self-pay | Admitting: Family Medicine

## 2013-05-17 NOTE — Telephone Encounter (Signed)
Patient states she is completely out of her flexeril rx. She states that she has called the pharmacy twice and has not heard anything from [place].

## 2013-05-17 NOTE — Telephone Encounter (Signed)
Will forward to Dr Losq 

## 2013-05-18 ENCOUNTER — Encounter (HOSPITAL_COMMUNITY): Payer: Medicare Other

## 2013-05-18 ENCOUNTER — Other Ambulatory Visit: Payer: Self-pay | Admitting: Family Medicine

## 2013-05-18 MED ORDER — CYCLOBENZAPRINE HCL 10 MG PO TABS: 10.0000 mg | ORAL_TABLET | Freq: Three times a day (TID) | ORAL | Status: DC | PRN

## 2013-05-18 NOTE — Telephone Encounter (Signed)
I am sorry [name] has had a difficult time with her medications. This seems to be an issue lately.   I have refilled #90 with 0 refills for her while Dr. Gwenlyn Saran is away. She can discuss further refills with Dr. Gwenlyn Saran.  Please let her know this has been sent in.  Thanks! Elaiza Shoberg M. Torrell Krutz, M.D.

## 2013-05-18 NOTE — Telephone Encounter (Signed)
LMOM advising pt of med at pharmacy.

## 2013-05-25 ENCOUNTER — Telehealth: Payer: Self-pay | Admitting: Family Medicine

## 2013-05-25 ENCOUNTER — Telehealth: Payer: Self-pay | Admitting: Psychology

## 2013-05-25 NOTE — Telephone Encounter (Signed)
Patient wants Dr. Gwenlyn Saran to be advised that her stress test is scheduled for tomorrow 10/3.

## 2013-05-25 NOTE — Telephone Encounter (Signed)
Kim called to schedule an appointment.  Left a VM.  I returned the call and left a VM.

## 2013-05-26 ENCOUNTER — Ambulatory Visit: Payer: Medicare Other | Admitting: Family Medicine

## 2013-05-29 ENCOUNTER — Ambulatory Visit (HOSPITAL_COMMUNITY): Payer: Medicare Other | Attending: Cardiology | Admitting: Radiology

## 2013-05-29 VITALS — BP 106/77 | HR 78 | Ht 62.0 in | Wt 209.0 lb

## 2013-05-29 DIAGNOSIS — R0789 Other chest pain: Secondary | ICD-10-CM | POA: Diagnosis not present

## 2013-05-29 DIAGNOSIS — Z87891 Personal history of nicotine dependence: Secondary | ICD-10-CM | POA: Diagnosis not present

## 2013-05-29 DIAGNOSIS — R55 Syncope and collapse: Secondary | ICD-10-CM | POA: Insufficient documentation

## 2013-05-29 DIAGNOSIS — R42 Dizziness and giddiness: Secondary | ICD-10-CM | POA: Insufficient documentation

## 2013-05-29 DIAGNOSIS — R0609 Other forms of dyspnea: Secondary | ICD-10-CM | POA: Insufficient documentation

## 2013-05-29 DIAGNOSIS — R0602 Shortness of breath: Secondary | ICD-10-CM

## 2013-05-29 DIAGNOSIS — Z01810 Encounter for preprocedural cardiovascular examination: Secondary | ICD-10-CM | POA: Insufficient documentation

## 2013-05-29 DIAGNOSIS — I10 Essential (primary) hypertension: Secondary | ICD-10-CM | POA: Insufficient documentation

## 2013-05-29 DIAGNOSIS — Z8249 Family history of ischemic heart disease and other diseases of the circulatory system: Secondary | ICD-10-CM | POA: Insufficient documentation

## 2013-05-29 DIAGNOSIS — R5381 Other malaise: Secondary | ICD-10-CM | POA: Diagnosis not present

## 2013-05-29 DIAGNOSIS — R0989 Other specified symptoms and signs involving the circulatory and respiratory systems: Secondary | ICD-10-CM | POA: Insufficient documentation

## 2013-05-29 DIAGNOSIS — R079 Chest pain, unspecified: Secondary | ICD-10-CM

## 2013-05-29 MED ORDER — TECHNETIUM TC 99M SESTAMIBI GENERIC - CARDIOLITE
30.0000 | Freq: Once | INTRAVENOUS | Status: AC | PRN
  Administered 2013-05-29: 30 via INTRAVENOUS

## 2013-05-29 MED ORDER — REGADENOSON 0.4 MG/5ML IV SOLN
0.4000 mg | Freq: Once | INTRAVENOUS | Status: AC
  Administered 2013-05-29: 0.4 mg via INTRAVENOUS

## 2013-05-29 MED ORDER — TECHNETIUM TC 99M SESTAMIBI GENERIC - CARDIOLITE
10.0000 | Freq: Once | INTRAVENOUS | Status: AC | PRN
  Administered 2013-05-29: 10 via INTRAVENOUS

## 2013-05-29 NOTE — Progress Notes (Addendum)
[facility] SITE 3 NUCLEAR MED [address] [place], [place] [address] [phone]    Cardiology Nuclear Med Study  Wendy [name] is a 52 y.o. female     MRN : [id number]     DOB: [birth year]  Procedure Date: 05/29/2013  Nuclear Med Background Indication for Stress Test:  Evaluation for Ischemia, and Pending Surgical Clearance for  (R) THR by Venita Lick, MD History:  No prior known history of CAD, and 12-2012 Dobutamine Echo: Nondiagnostic Cardiac Risk Factors: Family History - CAD, History of Smoking and Hypertension  Symptoms:  Chest Pain with and without Exertion (last date of chest discomfort 2 days ago), Dizziness, DOE, Fatigue, Light-Headedness and Near Syncope   Nuclear Pre-Procedure Caffeine/Decaff Intake:  None > 12 hrs NPO After: 8:00pm   Lungs:  clear O2 Sat: 96% on room air. IV 0.9% NS with Angio Cath:  22g  IV Site: R Antecubital x 1, tolerated well IV Started by:  Irean Hong, RN  Chest Size (in):  36 Cup Size: D  Height: 5\' 2"  (1.575 m)  Weight:  209 lb (94.802 kg)  BMI:  Body mass index is 38.22 kg/(m^2). Tech Comments:  Coreg this am    Nuclear Med Study 1 or 2 day study: 1 day  Stress Test Type:  Lexiscan  Reading MD: Olga Millers, MD  Order Authorizing Provider:  Marca Ancona, MD  Resting Radionuclide: Technetium 52m Sestamibi  Resting Radionuclide Dose: 10.5 mCi   Stress Radionuclide:  Technetium 48m Sestamibi  Stress Radionuclide Dose: 33.0 mCi           Stress Protocol Rest HR: 78 Stress HR: 85  Rest BP: 106/77 Stress BP: 105/72  Exercise Time (min): n/a METS: n/a   Predicted Max HR: 167 bpm % Max HR: 50.9 bpm Rate Pressure Product: 8925   Dose of Adenosine (mg):  n/a Dose of Lexiscan: 0.4 mg  Dose of Atropine (mg): n/a Dose of Dobutamine: n/a mcg/kg/min (at max HR)  Stress Test Technologist: Irean Hong, RN  Nuclear Technologist:  Doyne Keel, CNMT     Rest Procedure:  Myocardial perfusion imaging was performed at  rest 45 minutes following the intravenous administration of Technetium 35m Sestamibi. Rest ECG: NSR - Normal EKG  Stress Procedure:  The patient received IV Lexiscan 0.4 mg over 15-seconds.  Technetium 34m Sestamibi injected at 30-seconds. The patient complained of nausea, mild dizziness, but denied chest pain with Lexiscan. There was a hypotensive response to Lexiscan, BP 73/50, that improved with trendelenburg position and 0.9% NACL 250 ml bag IV fluid. Quantitative spect images were obtained after a 45 minute delay. Stress ECG: No significant ST segment change suggestive of ischemia.  QPS Raw Data Images:  Acquisition technically good; normal left ventricular size. Stress Images:  There is decreased uptake in the apex. Rest Images:  There is decreased uptake in the apex. Subtraction (SDS):  No evidence of ischemia. Transient Ischemic Dilatation (Normal <1.22):  NA Lung/Heart Ratio (Normal <0.45):  0.49  Quantitative Gated Spect Images QGS EDV:  55 ml QGS ESV:  12 ml  Impression Exercise Capacity:  Lexiscan with no exercise. BP Response:  Hypotensive response to lexiscan. Clinical Symptoms:  Patient complained of nausea. ECG Impression:  No significant ST segment change suggestive of ischemia. Comparison with Prior Nuclear Study: No previous nuclear study performed  Overall Impression:  Normal stress nuclear study with a small, mild, fixed apical defect consistent with thinning; no ischemia.  LV Ejection Fraction: 79%.  LV Wall Motion:  NL LV Function; NL Wall Motion   Brian Crenshaw  Normal study. May proceed to surgery without further testing. Please inform patient.   Marca Ancona 05/30/2013

## 2013-05-31 ENCOUNTER — Telehealth: Payer: Self-pay | Admitting: Cardiology

## 2013-05-31 ENCOUNTER — Telehealth: Payer: Self-pay | Admitting: Family Medicine

## 2013-05-31 MED ORDER — CLONAZEPAM 1 MG PO TABS
1.0000 mg | ORAL_TABLET | Freq: Three times a day (TID) | ORAL | Status: DC | PRN
Start: 2013-05-31 — End: 2013-07-04

## 2013-05-31 MED ORDER — HYDROXYZINE HCL 25 MG PO TABS: 25.0000 mg | ORAL_TABLET | Freq: Four times a day (QID) | ORAL | Status: DC | PRN

## 2013-05-31 NOTE — Progress Notes (Signed)
LMTCB

## 2013-05-31 NOTE — Telephone Encounter (Signed)
Follow up  Returned call.Marland Kitchen

## 2013-05-31 NOTE — Telephone Encounter (Signed)
New Problem:  Pt states she needs to speak to [facility]. Pt states Thurston Hole told her Dr. Shirlee Latch is not a doctor here. Pt seems a little confused. I couldn't make out the rest the patient was stating. Please call back and advise.

## 2013-05-31 NOTE — Telephone Encounter (Signed)
Sent Rx for hydroxyzine to [facility] [address]. Faxed Rx for clonazepam 1mg  tid/prn 90 tabs no refills.   Marena Chancy, PGY-3 Family Medicine Resident

## 2013-05-31 NOTE — Telephone Encounter (Signed)
Will fwd to MD.  Rayelynn Loyal L, CMA  

## 2013-05-31 NOTE — Telephone Encounter (Signed)
Spoke with patient , questions answered

## 2013-05-31 NOTE — Progress Notes (Signed)
Pt.notified

## 2013-05-31 NOTE — Telephone Encounter (Signed)
Pt.notified

## 2013-05-31 NOTE — Telephone Encounter (Signed)
Pt called and would like refills on clonazepam and hydroxyzine. JW

## 2013-06-05 ENCOUNTER — Other Ambulatory Visit: Payer: Self-pay | Admitting: Internal Medicine

## 2013-06-06 NOTE — Telephone Encounter (Signed)
Please advise regarding refills Sir, thank you. 

## 2013-06-06 NOTE — Telephone Encounter (Signed)
Refill x 4 She will need to see me in Dec or Jan

## 2013-06-08 ENCOUNTER — Ambulatory Visit: Payer: Medicare Other | Admitting: Family Medicine

## 2013-06-14 ENCOUNTER — Telehealth: Payer: Self-pay | Admitting: Family Medicine

## 2013-06-14 NOTE — Telephone Encounter (Signed)
Pt called and would like DR. Losq to call her concerning the surgeries that are coming up. She listened to the message that Dr. Gwenlyn Saran left but is confused on who is performing which surgeries JW

## 2013-06-14 NOTE — Telephone Encounter (Signed)
Will fwd. To Dr.Losq for review. .Keeghan Bialy  

## 2013-06-14 NOTE — Telephone Encounter (Signed)
I don't remember leaving a message recently. Maybe another doctor called. I will be sending the forms for both her surgeries to the orthopedic office, since I saw that she had a normal stress test.  Please let her know that I will be filling the paperwork out and sending it to her orthopedists this week. Thank you.  Marena Chancy, PGY-3 Family Medicine Resident

## 2013-06-16 ENCOUNTER — Encounter: Payer: Self-pay | Admitting: Family Medicine

## 2013-06-16 ENCOUNTER — Telehealth: Payer: Self-pay | Admitting: Family Medicine

## 2013-06-16 NOTE — Telephone Encounter (Signed)
Instructed pt needs OV before we can prescribe anything.  Pt verbalized understanding and will possibly make an appt.  Wendy [name], Darlyne Russian, CMA

## 2013-06-16 NOTE — Telephone Encounter (Signed)
Reviewed and agree that Ms. Volpi needs to be evaluated for this before anything can be prescribed.   Marena Chancy, PGY-3 Family Medicine Resident
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
## 2013-06-19 ENCOUNTER — Ambulatory Visit: Payer: Medicare Other | Admitting: Family Medicine

## 2013-06-29 ENCOUNTER — Ambulatory Visit (INDEPENDENT_AMBULATORY_CARE_PROVIDER_SITE_OTHER): Payer: Medicare Other | Admitting: Family Medicine

## 2013-06-29 ENCOUNTER — Encounter: Payer: Self-pay | Admitting: Family Medicine

## 2013-06-29 VITALS — BP 112/70 | HR 72 | Ht 62.0 in | Wt 210.0 lb

## 2013-06-29 DIAGNOSIS — M25559 Pain in unspecified hip: Secondary | ICD-10-CM | POA: Diagnosis not present

## 2013-06-29 DIAGNOSIS — T300 Burn of unspecified body region, unspecified degree: Secondary | ICD-10-CM | POA: Diagnosis not present

## 2013-06-29 DIAGNOSIS — Z23 Encounter for immunization: Secondary | ICD-10-CM

## 2013-06-29 DIAGNOSIS — M549 Dorsalgia, unspecified: Secondary | ICD-10-CM | POA: Diagnosis not present

## 2013-06-29 DIAGNOSIS — M25551 Pain in right hip: Secondary | ICD-10-CM
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
## 2013-06-30 ENCOUNTER — Telehealth: Payer: Self-pay | Admitting: Family Medicine

## 2013-06-30 NOTE — Telephone Encounter (Signed)
Ms. Happ calling regarding her rx for the Clonazepam.  Wanted to have it written today so someone could pick up before clinic closing.  Discussed with provider at yesterday's visit and thought one was written at that time.  Please call patient back for any further concerns regarding this.

## 2013-07-03 DIAGNOSIS — T300 Burn of unspecified body region, unspecified degree: Secondary | ICD-10-CM | POA: Insufficient documentation

## 2013-07-03 NOTE — Assessment & Plan Note (Signed)
Will resend letter to Dr. Charlann Boxer with results of normal stress test

## 2013-07-03 NOTE — Assessment & Plan Note (Signed)
Scheduled for kyphoplasty for lumbar spine compression
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
## 2013-07-04 ENCOUNTER — Telehealth: Payer: Self-pay | Admitting: Psychology

## 2013-07-04 MED ORDER — CLONAZEPAM 1 MG PO TABS: 1.0000 mg | ORAL_TABLET | Freq: Three times a day (TID) | ORAL | Status: DC | PRN

## 2013-07-04 NOTE — Telephone Encounter (Signed)
Please let Ms. Popescu know that there is an Rx for clonazepam at the front desk for her to pick up.   Marena Chancy, PGY-3 Family Medicine Resident

## 2013-07-04 NOTE — Telephone Encounter (Signed)
Pt notified.  Takashi Korol L, CMA  

## 2013-07-04 NOTE — Telephone Encounter (Signed)
Kim left a VM yesterday requesting a beh med appointment.  I called her back today and left a VM.

## 2013-07-17 ENCOUNTER — Telehealth: Payer: Self-pay | Admitting: Family Medicine

## 2013-07-17 NOTE — Telephone Encounter (Signed)
Pt called and would like someone to call her concerning medications and also about a place on her stomach. JW

## 2013-07-18 NOTE — Telephone Encounter (Signed)
Called pt. She reports, that she feels like she has ascitis. Advised pt to schedule OV. She agreed. Lorenda Hatchet, Renato Battles

## 2013-07-19 ENCOUNTER — Ambulatory Visit (INDEPENDENT_AMBULATORY_CARE_PROVIDER_SITE_OTHER): Payer: Medicare Other | Admitting: Family Medicine

## 2013-07-19 ENCOUNTER — Encounter: Payer: Self-pay | Admitting: Family Medicine

## 2013-07-19 VITALS — BP 140/99 | HR 89 | Temp 97.9°F | Ht 62.0 in | Wt 235.0 lb

## 2013-07-19 DIAGNOSIS — K219 Gastro-esophageal reflux disease without esophagitis: Secondary | ICD-10-CM

## 2013-07-19 DIAGNOSIS — R188 Other ascites: Secondary | ICD-10-CM | POA: Diagnosis not present

## 2013-07-19 LAB — CBC WITH DIFFERENTIAL/PLATELET
Basophils Absolute: 0 10*3/uL (ref 0.0–0.1)
Basophils Relative: 1 % (ref 0–1)
Eosinophils Absolute: 0.1 10*3/uL (ref 0.0–0.7)
Eosinophils Relative: 2 % (ref 0–5)
MCH: 40.3 pg — ABNORMAL HIGH (ref 26.0–34.0)
MCHC: 35.4 g/dL (ref 30.0–36.0)
MCV: 114 fL — ABNORMAL HIGH (ref 78.0–100.0)
Neutrophils Relative %: 49 % (ref 43–77)
Platelets: 195 10*3/uL (ref 150–400)
RBC: 3.15 MIL/uL — ABNORMAL LOW (ref 3.87–5.11)
RDW: 14.7 % (ref 11.5–15.5)

## 2013-07-19 LAB — PROTIME-INR
INR: 1.33 (ref <1.50)
Prothrombin Time: 16.3 seconds — ABNORMAL HIGH (ref 11.6–15.2)

## 2013-07-19 MED ORDER — SPIRONOLACTONE 100 MG PO TABS: 100.0000 mg | ORAL_TABLET | Freq: Every day | ORAL | Status: DC

## 2013-07-19 MED ORDER — OMEPRAZOLE 40 MG PO CPDR: 40.0000 mg | DELAYED_RELEASE_CAPSULE | Freq: Every day | ORAL | Status: DC

## 2013-07-19 MED ORDER — FUROSEMIDE 40 MG PO TABS: 40.0000 mg | ORAL_TABLET | Freq: Every day | ORAL | Status: DC

## 2013-07-19 NOTE — Progress Notes (Signed)
   Subjective:    Patient ID: Wendy [name], female    DOB: Jan 04, 1961, 52 y.o.   MRN: [id number]  HPI  52 year old female here for weight gain and send ascites. She states that over the last few weeks she's gained a lot of weight, and review her records she is increased 25 pounds over the last 20 days. He notes increased tightness in her abdomen and mild shortness of breath that has gradually increased in the last few weeks.  Denies increased salt intake. She also denies chest pain, increased work of breathing, and fever.   She's also complaining of a cough States that her cost been worse for about 2 weeks described as a dry cough. Also stating that her GERD symptoms have been worse than usual for about the same time. Again she denies fever, chills, sweats. He states she is told her GI doctor about her increased GERD symptoms who told her to increase her PPI.   Review of Systems Per HPI    Objective:   Physical Exam  Gen: NAD, alert, cooperative with exam HEENT: NCAT, EOMI, no scleral icterus or jaundice under tongue CV: RRR, good S1/S2, no murmur Resp: CTABL, no wheezes, non-labored Abd: Tense, non tender, distended, + fluid wave, + BS. Unable to palate organs.  Ext: 1+ pitting edema on BL LE Neuro: Alert and oriented, No gross deficits    Assessment & Plan:  See problem specific assessment and plan

## 2013-07-19 NOTE — Patient Instructions (Signed)
It was great to meet you today!  Be sure to limit your salt intake to 2 g or less daily  Come back in 1 week for repeat labs and to make sure you are losing some of this fluid.   Ascites Ascites is a gathering of fluid in the belly (abdomen). This is most often caused by liver disease. It may also be caused by a number of other less common problems. It causes a ballooning out (distension) of the abdomen. CAUSES  Scarring of the liver (cirrhosis) is the most common cause of ascites. Other causes include:  Infection or inflammation in the abdomen.  Cancer in the abdomen.  Heart failure.  Certain forms of kidney failure (nephritic syndrome).  Inflammation of the pancreas.  Clots in the veins of the liver. SYMPTOMS  In the early stages of ascites, you may not have any symptoms. The main symptom of ascites is a sense of abdominal bloating. This is due to the presence of fluid. This may also cause an increase in abdominal or waist size. People with this condition can develop swelling in the legs, and men can develop a swollen scrotum. When there is a lot of fluid, it may be hard to breath. Stretching of the abdomen by fluid can be painful. DIAGNOSIS  Certain features of your medical history, such as a history of liver disease and of an enlarging abdomen, can suggest the presence of ascites. The diagnosis of ascites can be made on physical exam by your caregiver. An abdominal ultrasound examination can confirm that ascites is present, and estimate the amount of fluid. Once ascites is confirmed, it is important to determine its cause. Again, a history of one of the conditions listed in "CAUSES" provides a strong clue. A physical exam is important, and blood and X-ray tests may be needed. During a procedure called paracentesis, a sample of fluid is removed from the abdomen. This can determine certain key features about the fluid, such as whether or not infection or cancer is present. Your caregiver  will determine if a paracentesis is necessary. They will describe the procedure to you. PREVENTION  Ascites is a complication of other conditions. Therefore to prevent ascites, you must seek treatment for any significant health conditions you have. Once ascites is present, careful attention to fluid and salt intake may help prevent it from getting worse. If you have ascites, you should not drink alcohol. PROGNOSIS  The prognosis of ascites depends on the underlying disease. If the disease is reversible, such as with certain infections or with heart failure, then ascites may improve or disappear. When ascites is caused by cirrhosis, then it indicates that the liver disease has worsened, and further evaluation and treatment of the liver disease is needed. If your ascites is caused by cancer, then the success or failure of the cancer treatment will determine whether your ascites will improve or worsen. RISKS AND COMPLICATIONS  Ascites is likely to worsen if it is not properly diagnosed and treated. A large amount of ascites can cause pain and difficulty breathing. The main complication, besides worsening, is infection (called spontaneous bacterial peritonitis). This requires prompt treatment. TREATMENT  The treatment of ascites depends on its cause. When liver disease is your cause, medical management using water pills (diuretics) and decreasing salt intake is often effective. Ascites due to peritoneal inflammation or malignancy (cancer) alone does not respond to salt restriction and diuretics. Hospitalization is sometimes required. If the treatment of ascites cannot be managed with medications,  a number of other treatments are available. Your caregivers will help you decide which will work best for you. Some of these are:  Removal of fluid from the abdomen (paracentesis).  Fluid from the abdomen is passed into a vein (peritoneovenous shunting).  Liver transplantation.  Transjugular intrahepatic  portosystemic stent shunt. HOME CARE INSTRUCTIONS  It is important to monitor body weight and the intake and output of fluids. Weigh yourself at the same time every day. Record your weights. Fluid restriction may be necessary. It is also important to know your salt intake. The more salt you take in, the more fluid you will retain. Ninety percent of people with ascites respond to this approach.  Follow any directions for medicines carefully.  Follow up with your caregiver, as directed.  Report any changes in your health, especially any new or worsening symptoms.  If your ascites is from liver disease, avoid alcohol and other substances toxic to the liver. SEEK MEDICAL CARE IF:   Your weight increases more than a few pounds in a few days.  Your abdominal or waist size increases.  You develop swelling in your legs.  You had swelling and it worsens. SEEK IMMEDIATE MEDICAL CARE IF:   You develop a fever.  You develop new abdominal pain.  You develop difficulty breathing.  You develop confusion.  You have bleeding from the mouth, stomach, or rectum. MAKE SURE YOU:   Understand these instructions.  Will watch your condition.  Will get help right away if you are not doing well or get worse. Document Released: 08/10/2005 Document Revised: 11/02/2011 Document Reviewed: 03/11/2007 [facility] Patient Information 2014 [place], [place].

## 2013-07-19 NOTE — Assessment & Plan Note (Signed)
Definite ascites after listeneing to her story, reviewing her weight change, and considering her physical exam.  Discussed possibility of needing a therapeutic tap. Offered options and patient would like diuretic therapy and salt restriction to start with.  Will start 100 mg spirono daily and 40 mg lasix, her BP is elevated above normal indicating she is fluid overloaded as well.  Discussed red flags at length, advised return in 1 week for OV for weight check and repeat labs with new spirono.  Will likely pursue tap then if she has not lost a decent amount of weight.

## 2013-07-19 NOTE — Assessment & Plan Note (Signed)
Dry cough, from her story is likely related to reflux Agree with her GI doctor's reccs per her, will increase prilosec to 40 daily, rx given F/u as needed

## 2013-07-20 LAB — COMPREHENSIVE METABOLIC PANEL
ALT: 28 U/L (ref 0–35)
Albumin: 2.2 g/dL — ABNORMAL LOW (ref 3.5–5.2)
CO2: 28 mEq/L (ref 19–32)
Calcium: 8.2 mg/dL — ABNORMAL LOW (ref 8.4–10.5)
Chloride: 108 mEq/L (ref 96–112)
Creat: 0.66 mg/dL (ref 0.50–1.10)
Glucose, Bld: 190 mg/dL — ABNORMAL HIGH (ref 70–99)
Potassium: 4.1 mEq/L (ref 3.5–5.3)
Total Protein: 6.7 g/dL (ref 6.0–8.3)

## 2013-07-24 ENCOUNTER — Telehealth: Payer: Self-pay | Admitting: Family Medicine

## 2013-07-24 DIAGNOSIS — S32009A Unspecified fracture of unspecified lumbar vertebra, initial encounter for closed fracture: Secondary | ICD-10-CM | POA: Diagnosis not present

## 2013-07-24 NOTE — Telephone Encounter (Signed)
Pt states her medicine for the ascities is not helping. Seems to be worse today Please advise

## 2013-07-24 NOTE — Telephone Encounter (Signed)
Called pt. Does not have scale. Does not know her weight. Advised to schedule appt for tomorrow morning. Pt agreed. Lorenda Hatchet, Renato Battles

## 2013-07-25 ENCOUNTER — Ambulatory Visit (INDEPENDENT_AMBULATORY_CARE_PROVIDER_SITE_OTHER): Payer: Medicare Other | Admitting: Family Medicine

## 2013-07-25 ENCOUNTER — Telehealth: Payer: Self-pay | Admitting: Psychology

## 2013-07-25 VITALS — BP 132/85 | HR 85 | Ht 62.0 in | Wt 225.8 lb

## 2013-07-25 DIAGNOSIS — R0789 Other chest pain: Secondary | ICD-10-CM | POA: Diagnosis not present

## 2013-07-25 DIAGNOSIS — M549 Dorsalgia, unspecified: Secondary | ICD-10-CM

## 2013-07-25 DIAGNOSIS — R188 Other ascites: Secondary | ICD-10-CM | POA: Diagnosis not present

## 2013-07-25 DIAGNOSIS — K7460 Unspecified cirrhosis of liver: Secondary | ICD-10-CM | POA: Diagnosis not present

## 2013-07-25 DIAGNOSIS — I10 Essential (primary) hypertension: Secondary | ICD-10-CM | POA: Diagnosis not present

## 2013-07-25 LAB — COMPREHENSIVE METABOLIC PANEL
ALT: 25 U/L (ref 0–35)
Albumin: 2.1 g/dL — ABNORMAL LOW (ref 3.5–5.2)
Alkaline Phosphatase: 130 U/L — ABNORMAL HIGH (ref 39–117)
BUN: 17 mg/dL (ref 6–23)
Chloride: 104 mEq/L (ref 96–112)
Creat: 0.86 mg/dL (ref 0.50–1.10)
Sodium: 138 mEq/L (ref 135–145)
Total Bilirubin: 1 mg/dL (ref 0.3–1.2)
Total Protein: 6.9 g/dL (ref 6.0–8.3)

## 2013-07-25 MED ORDER — OXYCODONE HCL 10 MG PO TABS: 10.0000 mg | ORAL_TABLET | Freq: Three times a day (TID) | ORAL | Status: DC | PRN

## 2013-07-25 NOTE — Patient Instructions (Signed)
Liver Cirrhosis - continue Laxis and Spironolactone as prescribed, please make an appointment with your GI/liver physician in [place] ([place]) for discussion on possible peritoneal tap, check lab work today

## 2013-07-25 NOTE — Telephone Encounter (Signed)
Kim called and left a VM requesting an appointment for tomorrow around the time she sees her PCP.  We have been exchanging VMs for a long time.  As far as I can tell, I haven't seen her since prior to 2010 but have multiple phone notes logged.  I called back and left a VM stating that I don't have a full appointment slot available but I could touch base with her tomorrow around 3:00 of she thought it would be helpful.  Will wait to hear back.

## 2013-07-26 ENCOUNTER — Telehealth: Payer: Self-pay | Admitting: Family Medicine

## 2013-07-26 ENCOUNTER — Other Ambulatory Visit: Payer: Medicare Other

## 2013-07-26 DIAGNOSIS — R188 Other ascites: Secondary | ICD-10-CM

## 2013-07-26 DIAGNOSIS — R0789 Other chest pain: Secondary | ICD-10-CM | POA: Insufficient documentation

## 2013-07-26 NOTE — Assessment & Plan Note (Signed)
Patient reports atypical chest pain earlier today that is now resolved.  -In the setting of normal stress test last month and resolution of symptoms no further workup will be pursued at this time -Patient to call EMS/go to ED if symptoms recur.

## 2013-07-26 NOTE — Assessment & Plan Note (Signed)
Back Pain stable. Refill of oxycodone provided.

## 2013-07-26 NOTE — Progress Notes (Signed)
   Subjective:    Patient ID: Wendy [name], female    DOB: 1960/12/12, 52 y.o.   MRN: [id number]  HPI 52 y/o female with PMH Hepatitis C, Liver Cirrhosis, Ascites presents for follow up of volume overload/ascites. She was recently see in office by Dr. Ermalinda Memos for worsening abdominal distension/ascites, she was started on Lasix and Spironolactone, patient has noted some weight loss since last visit however continues to have significant ascites, she follows with Dr. Julieta Gutting in [facility] for her cirrhosis however has not been seen there for over a year, she has need paracentesis once previously when hospitalized due to her ascites, denies nausea or vomiting, does have some constipation however still passing stools and having flatus  Patient reports one episode of chest discomfort this morning, last approx. two hours, was present in the substernal area, pressure sensation, no radiation, no associated diaphoresis, mild sob, patient underwent stress test last month that was negative for ischemia, currently has no chest pain   Patient requests refill of oxycodone for her chronic back pain, pain is well controlled with as needed oxycodone   Review of Systems  Constitutional: Negative for chills and diaphoresis.  Respiratory: Negative for chest tightness and shortness of breath.   Cardiovascular: Positive for leg swelling. Negative for chest pain.  Gastrointestinal: Positive for abdominal pain, constipation and abdominal distention. Negative for nausea, vomiting and diarrhea.       Objective:   Physical Exam Vitals: reviewed, patient has had 10 pound weight loss since last visit Gen: pleasant female, NAD Cardiac: RRR, S1 and S2 present, no murmurs, no heaves/thrills Resp: CTAB, normal effort Abd: distended, soft, normal bowel sounds, unable to evaluate for hepatomegaly due due to ascites, no rebound, no guarding Ext: 2+ edema present       Assessment & Plan:  Please see problem  specific assessment and plan.

## 2013-07-26 NOTE — Telephone Encounter (Signed)
Will FWD to PCP.  Veola Cafaro L, CMA  

## 2013-07-26 NOTE — Telephone Encounter (Signed)
Pt called and would like Dr. Gwenlyn Saran to send a referral to [name] to have her stomach drained. The office number is [phone] and fax [phone]. Myriam Jacobson

## 2013-07-26 NOTE — Assessment & Plan Note (Signed)
Mild improvement with diuretics.  -Patient to make appointment with GI physician for possible therapeutic paracentesis -check renal function/potassium due to new lasix/spironolactone

## 2013-07-26 NOTE — Assessment & Plan Note (Signed)
Patient presents for evaluation of cirrhosis/ascites. She has had a 10 pound weight loss since starting Lasix and Spironolactone at last visit.  -Patient counseled to continue diuretics -She was counseled to follow up with her GI Physician Dr. Julieta Gutting in regards to her liver disease, she may require therapeutic Paracentesis

## 2013-07-27 NOTE — Telephone Encounter (Signed)
Called patient back. She tells me that Dr. Leone Payor needs a referral for her to see him for the evaluation for paracentesis. She also tells me that her orthopedist, Dr. Shon Baton needs a medical clearance for her kyphoplasty. I have already sent a letter stating that she is medically optimized on a cardiac standpoint (normal stress test). If she needs further clearance regarding her liver, I asked her to ask Dr. Leone Payor since he knows her hepatitis history better than I do.  She expressed understanding and agreed to plan.   Marena Chancy, PGY-3 Family Medicine Resident

## 2013-07-31 NOTE — Telephone Encounter (Signed)
Kim left a VM on Saturday and I called her today and we actually talked!  We scheduled for December 19th at 10:00.  She is to call if she can't make the appointment.

## 2013-08-09 ENCOUNTER — Telehealth: Payer: Self-pay | Admitting: Internal Medicine

## 2013-08-09 NOTE — Telephone Encounter (Signed)
Dr. Leone Payor I spoke with the patient.  She needs "clearance for a procedure"  She is not sure what it is, but reading office note from 07/25/13 looks like she needs paracentesis.  I have scheduled her to see Mike Gip PA next week (she can't get here sooner).  Ok to schedule here or should she go see Dr. Julieta Gutting?

## 2013-08-10 ENCOUNTER — Telehealth: Payer: Self-pay | Admitting: Psychology

## 2013-08-10 NOTE — Telephone Encounter (Signed)
I would tell her that unless she is having breathing problems or sig pain, vomiting from ascites she should continue with diuretics and avoid paracentesis

## 2013-08-10 NOTE — Telephone Encounter (Signed)
I called Wendy [name] to check if she would be attending her appointment tomorrow since she often cancels.  She is feeling under the weather and wants to reschedule.  She said she will call me back after the holidays.  She does need to follow up with Dr. Gwenlyn Saran.  I scheduled her an appointment at the first available - January 5th at 10:30.  She was able to repeat the date and time back to me.

## 2013-08-11 NOTE — Telephone Encounter (Signed)
I have left a message for the patient with Dr. Marvell Fuller recommendations.  I asked that she keep the office visit for next week

## 2013-08-14 ENCOUNTER — Other Ambulatory Visit: Payer: Self-pay | Admitting: Family Medicine

## 2013-08-15 ENCOUNTER — Ambulatory Visit (INDEPENDENT_AMBULATORY_CARE_PROVIDER_SITE_OTHER): Payer: Medicare Other | Admitting: Physician Assistant

## 2013-08-15 ENCOUNTER — Encounter: Payer: Self-pay | Admitting: Physician Assistant

## 2013-08-15 ENCOUNTER — Encounter: Payer: Self-pay | Admitting: Family Medicine

## 2013-08-15 ENCOUNTER — Ambulatory Visit (INDEPENDENT_AMBULATORY_CARE_PROVIDER_SITE_OTHER): Payer: Medicare Other | Admitting: Family Medicine

## 2013-08-15 ENCOUNTER — Other Ambulatory Visit (INDEPENDENT_AMBULATORY_CARE_PROVIDER_SITE_OTHER): Payer: Medicare Other

## 2013-08-15 VITALS — BP 110/82 | HR 76 | Ht 62.0 in | Wt 203.8 lb

## 2013-08-15 VITALS — BP 124/85 | HR 80 | Temp 98.1°F | Ht 62.0 in | Wt 203.0 lb

## 2013-08-15 DIAGNOSIS — K7460 Unspecified cirrhosis of liver: Secondary | ICD-10-CM

## 2013-08-15 DIAGNOSIS — R188 Other ascites: Secondary | ICD-10-CM

## 2013-08-15 DIAGNOSIS — Z01818 Encounter for other preprocedural examination: Secondary | ICD-10-CM

## 2013-08-15 DIAGNOSIS — F4001 Agoraphobia with panic disorder: Secondary | ICD-10-CM

## 2013-08-15 LAB — BASIC METABOLIC PANEL
CO2: 27 mEq/L (ref 19–32)
GFR: 84.41 mL/min (ref >60.00)
Sodium: 137 mEq/L (ref 135–145)

## 2013-08-15 LAB — AMMONIA: Ammonia: 103 umol/L — ABNORMAL HIGH (ref 11–35)

## 2013-08-15 MED ORDER — CLONAZEPAM 1 MG PO TABS: 1.0000 mg | ORAL_TABLET | Freq: Three times a day (TID) | ORAL | Status: DC | PRN

## 2013-08-15 MED ORDER — CYCLOBENZAPRINE HCL 10 MG PO TABS: 10.0000 mg | ORAL_TABLET | Freq: Three times a day (TID) | ORAL | Status: DC | PRN

## 2013-08-15 NOTE — Progress Notes (Signed)
Subjective:    Patient ID: Wendy [name], female    DOB: Nov 07, 1960, 52 y.o.   MRN: [id number]  HPI  Wendy [name] is a 52 year old white female known to Dr. 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 She also has significant psychiatric history with panic disorder, or phobia and depression. She was last seen in the office in January of 2014 and is referred back today for clearance prior to a vertebroplasty and also for consideration of paracentesis. She has been managed in the interim by family practice. Last EGD January 2014 no varices she did have a distal esophageal stricture which was dilated and evidence of portal gastropathy as well as hiatal hernia. Colonoscopy January 2013 negative with the exception of moderate diverticulosis.  Patient had been started on diuretics earlier this fall and is currently on Lasix 40 mg by mouth every morning and Aldactone 100 mg every morning. When she was seen and family practice on 07/25/2013 her weight was 225 however today her weight is down to 203. She says she's been good about taking her medicines over the past few weeks but prior to that had not been taking the diuretics on a regular basis. She has been having a lot of back pain has a compression fracture and is awaiting a vertebroplasty with Dr. Shon Baton of [facility]. She says her abdomen has gone down in size quite a bit over the past couple of weeks and she's not having any abdominal discomfort or shortness of breath. She says she's been feeling a bit better. She has been abstinent from alcohol for at least the past year. She has not been treated for her hepatitis C due to previous history of EtOH abuse.    Review of Systems  Constitutional: Positive for fatigue.  HENT: Negative.   Eyes: Negative.   Respiratory: Negative.   Gastrointestinal: Positive for abdominal distention.  Endocrine: Negative.   Genitourinary: Negative.   Musculoskeletal: Positive  for back pain.  Skin: Negative.   Allergic/Immunologic: Negative.   Neurological: Negative.   Hematological: Negative.   Psychiatric/Behavioral: Negative.    Outpatient Prescriptions Prior to Visit  Medication Sig Dispense Refill  . alendronate (FOSAMAX) 70 MG tablet Take 70 mg by mouth every Thursday. Take with a full glass of water on an empty stomach.      . calcium-vitamin D (OSCAL-500) 500-400 MG-UNIT per tablet 2 tablets 2 (two) times daily.       . carboxymethylcellulose (REFRESH) 1 % ophthalmic solution Apply 1 drop to eye 3 (three) times daily.      . carvedilol (COREG) 12.5 MG tablet Take 1 tablet (12.5 mg total) by mouth 2 (two) times daily with a meal.  60 tablet  3  . clonazePAM (KLONOPIN) 1 MG tablet Take 1 tablet (1 mg total) by mouth 3 (three) times daily as needed for anxiety.  90 tablet  0  . cycloSPORINE (RESTASIS) 0.05 % ophthalmic emulsion Place 1 drop into both eyes 2 (two) times daily.  0.4 mL  2  . furosemide (LASIX) 40 MG tablet Take 1 tablet (40 mg total) by mouth daily.  30 tablet  3  . hydrOXYzine (ATARAX/VISTARIL) 25 MG tablet TAKE 1 TABLET BY MOUTH 4 TIMES A DAY AS NEEDED FOR ITCHING  30 tablet  3  . lactulose (CHRONULAC) 10 GM/15ML solution Take 10 g by mouth 2 (two) times daily as needed (for constipation).      Marland Kitchen loratadine (CLARITIN) 10 MG tablet Take  1 tablet (10 mg total) by mouth daily.  30 tablet  11  . Olopatadine HCl (PATADAY) 0.2 % SOLN Apply 1 drop to eye daily.  1 Bottle  5  . omeprazole (PRILOSEC) 40 MG capsule Take 1 capsule (40 mg total) by mouth daily.  30 capsule  5  . Oxycodone HCl 10 MG TABS Take 1 tablet (10 mg total) by mouth 3 (three) times daily as needed (for pain).  90 tablet  0  . Prenatal 28-0.8 MG TABS Take 1 tablet by mouth daily.      . QUEtiapine (SEROQUEL) 200 MG tablet TAKE 1 TABLET BY MOUTH AT BEDTIME  30 tablet  3  . spironolactone (ALDACTONE) 100 MG tablet Take 1 tablet (100 mg total) by mouth daily.  30 tablet  3  . XIFAXAN  550 MG TABS tablet TAKE 1 TABLET TWICE A DAY  60 tablet  3  . cyclobenzaprine (FLEXERIL) 10 MG tablet Take 1 tablet (10 mg total) by mouth 3 (three) times daily as needed for muscle spasms.  90 tablet  0  . hydrOXYzine (ATARAX/VISTARIL) 25 MG tablet Take 1 tablet (25 mg total) by mouth 4 (four) times daily as needed for itching.  30 tablet  3  . QUEtiapine (SEROQUEL) 200 MG tablet Take 1 tablet (200 mg total) by mouth at bedtime.  30 tablet  3   No facility-administered medications prior to visit.   Allergies  Allergen Reactions  . Codeine Phosphate Itching    REACTION: unspecified   Patient Active Problem List   Diagnosis Date Noted  . Atypical chest pain 07/26/2013  . Ascites 07/19/2013  . Burn 07/03/2013  . Dyspnea 02/03/2013  . Abnormal stress echo 01/19/2013  . Tobacco abuse 10/22/2012  . Preoperative clearance 09/27/2012  . Allergic conjunctivitis 06/19/2012  . Skin lesion 06/19/2012  . Lumbar spondylosis 12/24/2011  . Obesity (BMI 35.0-39.9 without comorbidity) 12/08/2011  . Right hip pain 12/01/2011  . Cutaneous skin tags 11/29/2011  . Chronic hepatitis C 10/06/2011  . GERD (gastroesophageal reflux disease) 10/06/2011  . Hepatic encephalopathy syndrome 07/28/2011  . Back pain 04/07/2011  . 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 THROMBOCYTOPENIA 10/21/2006  . DEPRESSIVE DISORDER, NOS 10/21/2006  . HYPERTENSION, BENIGN SYSTEMIC 10/21/2006  . HEPATIC CIRRHOSIS WITH HCV AND HX OF ALCOHOL 10/21/2006   History  Substance Use Topics  . Smoking status: Current Some Day Smoker -- 0.25 packs/day    Types: Cigarettes    Last Attempt to Quit: 12/23/2008  . Smokeless tobacco: Never Used  . Alcohol Use: No     Comment: goes to AA   family history includes Heart disease in her mother; Other in her daughter and daughter. There is no history of Colon cancer.     Objective:   Physical Exam  is well-developed older white female in no acute distress, pleasant blood  pressure 110/82 pulse 76 height 5 foot 2 weight 203. HEENT; nontraumatic normocephalic EOMI PERRLA sclera anicteric,Neck; Supple no JVD, Cardiovascular ;regular rate and rhythm with S1-S2, Pulmonary; clear bilaterally, Abdomen; large soft nondistended left lobe of the liver is palpable in the epigastrium is nontender no guarding or rebound no definite fluid wave, Rectal; not done, Extremities; no clubbing cyanosis or edema skin warm and dry, Psych; mood and affect appropriate        Assessment & Plan:  #39  52 year old female with cirrhosis secondary to EtOH and hepatitis C. Parameters have been stable and currently no coagulopathy or thrombocytopenia. She does not  have esophageal varices.  #2 ascites/volume overload-weight is down 22 pounds over the past 3 weeks with compliance with diuretic regimen. I do not think she needs paracentesis at this time #3 history of hepatic encephalopathy currently on Xifaxan/ Chronulac doing well #4 diverticulosis  Plan; #1 Patient is stable to undergo a vertebroplasty for compression fracture. With her history of advanced cirrhosis she is at somewhat increased risk for any medical procedures or anesthesia.  #2 no paracentesis at this time she will continue Aldactone 100 mg by mouth every morning and Lasix 40 mg every morning #3 check BMET, venous ammonia ,and alpha-fetoprotein level today Patient will followup with Dr. Leone Payor in 4-5 weeks

## 2013-08-15 NOTE — Patient Instructions (Signed)
Continue Lasix 40mg  every morning and Aldactone 100mg  every morning. Stay on a low sodium diet.  Please go to the lab today for the following test: BMET, VENUS AMMONIA, AFP Follow up with Dr. Leone Payor in 4-6- weeks We are sending a letter of clearance to Dr. Shon Baton @ [facility].

## 2013-08-15 NOTE — Patient Instructions (Signed)
I will send a fax to Dr. Shon Baton with the information from the cardiologists and gastroenterologist.

## 2013-08-20 NOTE — Progress Notes (Signed)
Patient ID: Wendy [name]    DOB: Apr 21, 1961, 52 y.o.   MRN: [id number] --- Subjective:  Wendy [name] is a 52 y.o.female with h/o cirrhosis from hep C and EtOH abuse, and h/o chronic right hip pain who presents to discuss medical clearance for future kyphoplasty for vertebral compression fracture. She also needs a refill of clonazepam and flexeril. She went to [facility] and saw Eliott Nine, who did not recommend a paracentesis at this time. Per GI, she is stable to undergo vertebroplasty.  On a cardiac standpoint, she was evaluated by cardiology with a Lexiscan stress test which was normal, with a small, mild, fixed atypical defect consistent with thinning, no ischemia with instructions to follow with surgery without further testing.  Patient currently denies any chest pain, shortness of breath at rest, lower extremity swelling. She denies any abdominal pain or worsening abdominal swelling.   ROS: see HPI Past Medical History: reviewed and updated medications and allergies. Social History: Tobacco: some day user  Objective: Filed Vitals:   08/15/13 1528  BP: 124/85  Pulse: 80  Temp: 98.1 F (36.7 C)    Physical Examination:   General appearance - alert, well appearing, and in no distress Chest - clear to auscultation, no wheezes, rales or rhonchi, symmetric air entry Heart - normal rate, regular rhythm, normal S1, S2, no murmurs Abdomen - soft, nontender, non distended Extremities - no pedal edema

## 2013-08-20 NOTE — Assessment & Plan Note (Signed)
Refilled clonazepam.

## 2013-08-20 NOTE — Assessment & Plan Note (Signed)
Stable on a cardiac and a GI standpoint for kyphoplasty. Will send fax to Dr. Shon Baton with specialists' notes for medical clearance.

## 2013-08-21 ENCOUNTER — Telehealth: Payer: Self-pay | Admitting: Family Medicine

## 2013-08-21 ENCOUNTER — Encounter: Payer: Self-pay | Admitting: Family Medicine

## 2013-08-21 NOTE — Telephone Encounter (Signed)
Faxed office notes from GI and cardiology regarding medical clearance for patient's vertebroplasty.  Faxed it to Dr. Shon Baton: fax: [phone]  Marena Chancy, PGY-3 Family Medicine Resident

## 2013-08-23 NOTE — Progress Notes (Signed)
Agree with Ms. Esterwood's assessment and plan. Carl E. Gessner, MD, FACG   

## 2013-08-28 ENCOUNTER — Ambulatory Visit: Payer: Medicare Other | Admitting: Family Medicine

## 2013-08-30 ENCOUNTER — Telehealth: Payer: Self-pay | Admitting: Family Medicine

## 2013-08-30 ENCOUNTER — Telehealth: Payer: Self-pay | Admitting: Internal Medicine

## 2013-08-30 NOTE — Telephone Encounter (Signed)
Amy Esterwood PA's note from 08/15/13 faxed to Dr. Rolena Infante

## 2013-08-30 NOTE — Telephone Encounter (Signed)
Faxed clearance letter and office note again to Dr Rolena Infante at [phone].Keyshun Elpers, Kevin Fenton

## 2013-08-30 NOTE — Telephone Encounter (Signed)
Pt states dr Rolena Infante has not received surgery clearance letter. Notes shows it was faxed 08-21-13 Please call dr brooks to let him know about clearance

## 2013-09-05 ENCOUNTER — Telehealth: Payer: Self-pay | Admitting: Family Medicine

## 2013-09-05 NOTE — Telephone Encounter (Signed)
Pt called and wanted to know if Dr. Otis Dials sent the paper work to [employer]. JW

## 2013-09-05 NOTE — Telephone Encounter (Signed)
Pt notified it was faxed on 08/30/2008.   Jood Retana, Loralyn Freshwater, [place]

## 2013-09-11 DIAGNOSIS — S32009A Unspecified fracture of unspecified lumbar vertebra, initial encounter for closed fracture: Secondary | ICD-10-CM | POA: Diagnosis not present

## 2013-09-11 DIAGNOSIS — S22009A Unspecified fracture of unspecified thoracic vertebra, initial encounter for closed fracture: Secondary | ICD-10-CM | POA: Diagnosis not present

## 2013-09-12 ENCOUNTER — Telehealth: Payer: Self-pay | Admitting: Psychology

## 2013-09-12 ENCOUNTER — Ambulatory Visit: Payer: Medicare Other | Admitting: Family Medicine

## 2013-09-12 ENCOUNTER — Telehealth: Payer: Self-pay | Admitting: Family Medicine

## 2013-09-12 MED ORDER — LACTULOSE 10 GM/15ML PO SOLN: 10.0000 g | Freq: Two times a day (BID) | ORAL | Status: DC | PRN

## 2013-09-12 MED ORDER — OXYCODONE HCL 10 MG PO TABS: 10.0000 mg | ORAL_TABLET | Freq: Three times a day (TID) | ORAL | Status: DC | PRN

## 2013-09-12 NOTE — Telephone Encounter (Signed)
Pt is waiting for a call back from Dr Rolena Infante and cannot keep her appt today She wants her ocycodone and lactoulose called in She will reschedule the appt later

## 2013-09-12 NOTE — Telephone Encounter (Signed)
Wendy [name] called requesting an appointment.  Scheduled for the 29th at 10:00.  She asked that I understand if something comes up (we have all telephone calls and no actual meetings).  I asked her to call in advance and she agreed.

## 2013-09-12 NOTE — Telephone Encounter (Signed)
Oxycodone cannot be filled over the phone because it is a controled substance. She can come to the clinic and pick up the prescription at the front desk. Please let patient that in the future she will need to be seen in the clinic to get a refill on narcotics. This is our clinic policy.  I will add a coupon for flexeril with the Rx for oxycodone.  Please let patient know. Thank you!  Liam Graham, PGY-3 Family Medicine Resident

## 2013-09-12 NOTE — Telephone Encounter (Signed)
Message given to patient.  Dustie Brittle L, CMA  

## 2013-09-12 NOTE — Telephone Encounter (Signed)
Patient also would like a "coupon" for Flexeril. She states she has received it from Dr. Otis Dials before.

## 2013-09-14 NOTE — Telephone Encounter (Signed)
Wendy [name] called to cancel her appointment on 1/29 because of a surgery.  She thought her appointment was after the surgery but it is the day before.  She still elected to cancel it and stated she would call sometime after her surgery.  I have yet to see her in clinic.

## 2013-09-18 DIAGNOSIS — K7460 Unspecified cirrhosis of liver: Secondary | ICD-10-CM | POA: Diagnosis not present

## 2013-09-18 DIAGNOSIS — B1920 Unspecified viral hepatitis C without hepatic coma: Secondary | ICD-10-CM | POA: Diagnosis not present

## 2013-09-18 NOTE — H&P (Signed)
History of Present Illness The patient is a 53 year old female who presents today for follow up of their back. The patient is being followed for their back pain (T12-L1 compression fractures that began in July of 2014). They are now 6 month(s) out. Symptoms reported today include: pain, aching, stiffness, pain with weightbearing, difficulty ambulating, difficulty arising from chair, weakness, numbness, leg pain, foot pain, pain with lying, pain with lifting, pain with sitting and pain with standing. The patient states that they are doing poorly. The following medication has been used for pain control: none. The patient reports their current pain level to be 8 / 10. The patient presents today following She states I should have all of the medical clearance that Dr Rolena Infante needs". Note for "Follow-up back": Patient is in for surgical discussion     Allergies Codeine Phosphate *ANALGESICS - OPIOID*    Social History Alcohol use. current drinker; drinks beer Children. 2 Current work status. disabled Drug/Alcohol Rehab (Currently). no Drug/Alcohol Rehab (Previously). yes Exercise. Exercises rarely Illicit drug use. no Living situation. live with partner Marital status. widowed Number of flights of stairs before winded. less than 1 Pain Contract. yes Tobacco / smoke exposure. no Tobacco use. Former smoker. former smoker; smoke(d) 1 pack(s) per day    Medication History Calcitonin ([place]) (200UNIT/ACT Solution, 1 (one) Solution Nasal 1 spray in nostril a day, Taken starting 03/14/2013) Active. ClonazePAM (1MG  Tablet, Oral) Active. Furosemide (40MG  Tablet, Oral) Active. HydrOXYzine HCl (25MG  Tablet, Oral) Active. Loratadine (10MG  Tablet, Oral) Active. Omeprazole (40MG  Capsule DR, Oral) Active. QUEtiapine Fumarate (200MG  Tablet, Oral) Active. Spironolactone (100MG  Tablet, Oral) Active. Xifaxan (550MG  Tablet, Oral) Active. Medications  Reconciled.  Objective Transcription  She is a pleasant woman who appears younger than her stated age. She is alert and oriented times three.  Respiratory, no shortness of breath or chest pain.  Abdomen, soft and nontender.  On examination, she has significant right groin/hip pain which is chronic. No knee or ankle pain. She ambulates without an assistive device. EHL, tibialis anterior, gastrocnemius is intact bilaterally. Compartments are soft and nontender. One plus peripheral pulses.  RADIOGRAPHS:  At this point in time, repeat x-rays show the T12-L1 compression fractures doe not appear to be changed. There is about 25 degrees of local kyphosis at those levels.  I have reviewed the March 21, 2013, which shows the two T12-L1 compression deformities.     Assessment & Plan Compression deformity of vertebra (738.5)   At this point in time, I have given the patient the kyphoplasty book and we discussed the kyphoplasty procedure. I think she is still having significant pain that does localize to these chronic appearing T12-L1 compression fractures. Even though they are chronic they can still cause significant pain and loss of quality of life. Of course, the patient has some mild degenerative changes in the lower lumbar spine and she also has a chronic right hip issue for which she has already had two hip operations. At this point, if we were to obtain clearance from her liver specialist as well as her primary care physician, then I would be happy to proceed with the kyphoplasty. We discussed the risks of the surgery which include infection, bleeding, nerve damage, death, stroke, paralysis, cement leak, need for further surgery, ongoing or worse pain. All of the patient's questions were encouraged and addressed. We will get preoperative medical clearance and then once we have that clearance from the two physicians then we will plan on proceeding with the  surgery.

## 2013-09-19 ENCOUNTER — Encounter (HOSPITAL_COMMUNITY): Payer: Self-pay

## 2013-09-19 ENCOUNTER — Ambulatory Visit (HOSPITAL_COMMUNITY)
Admission: RE | Admit: 2013-09-19 | Discharge: 2013-09-19 | Disposition: A | Discharge status: Home / Self Care | Payer: Medicare Other | Source: Ambulatory Visit | Attending: Orthopedic Surgery | Admitting: Orthopedic Surgery

## 2013-09-19 ENCOUNTER — Encounter (HOSPITAL_COMMUNITY)
Admission: RE | Admit: 2013-09-19 | Discharge: 2013-09-19 | Disposition: A | Discharge status: Home / Self Care | Payer: Medicare Other | Source: Ambulatory Visit | Attending: Orthopedic Surgery | Admitting: Orthopedic Surgery

## 2013-09-19 DIAGNOSIS — J9819 Other pulmonary collapse: Secondary | ICD-10-CM | POA: Diagnosis not present

## 2013-09-19 DIAGNOSIS — M539 Dorsopathy, unspecified: Secondary | ICD-10-CM | POA: Diagnosis not present

## 2013-09-19 DIAGNOSIS — S22009A Unspecified fracture of unspecified thoracic vertebra, initial encounter for closed fracture: Secondary | ICD-10-CM | POA: Diagnosis not present

## 2013-09-19 DIAGNOSIS — Z01818 Encounter for other preprocedural examination: Secondary | ICD-10-CM | POA: Diagnosis not present

## 2013-09-19 DIAGNOSIS — K449 Diaphragmatic hernia without obstruction or gangrene: Secondary | ICD-10-CM | POA: Insufficient documentation

## 2013-09-19 LAB — SURGICAL PCR SCREEN
MRSA, PCR: NEGATIVE
Staphylococcus aureus: NEGATIVE

## 2013-09-19 LAB — COMPREHENSIVE METABOLIC PANEL
ALT: 40 U/L — AB (ref 0–35)
AST: 77 U/L — ABNORMAL HIGH (ref 0–37)
Albumin: 2.4 g/dL — ABNORMAL LOW (ref 3.5–5.2)
Alkaline Phosphatase: 135 U/L — ABNORMAL HIGH (ref 39–117)
BUN: 18 mg/dL (ref 6–23)
CALCIUM: 8.9 mg/dL (ref 8.4–10.5)
CO2: 25 meq/L (ref 19–32)
Chloride: 99 mEq/L (ref 96–112)
Creatinine, Ser: 0.95 mg/dL (ref 0.50–1.10)
GFR calc Af Amer: 78 mL/min — ABNORMAL LOW (ref >90)
GFR, EST NON AFRICAN AMERICAN: 67 mL/min — AB (ref >90)
Glucose, Bld: 113 mg/dL — ABNORMAL HIGH (ref 70–99)
Potassium: 5.4 mEq/L — ABNORMAL HIGH (ref 3.7–5.3)
SODIUM: 135 meq/L — AB (ref 137–147)
Total Bilirubin: 1 mg/dL (ref 0.3–1.2)
Total Protein: 7.9 g/dL (ref 6.0–8.3)

## 2013-09-19 LAB — CBC
HCT: 36.4 % (ref 36.0–46.0)
Hemoglobin: 13.4 g/dL (ref 12.0–15.0)
MCH: 41.2 pg — ABNORMAL HIGH (ref 26.0–34.0)
MCHC: 36.8 g/dL — AB (ref 30.0–36.0)
MCV: 112 fL — ABNORMAL HIGH (ref 78.0–100.0)
Platelets: 177 10*3/uL (ref 150–400)
RBC: 3.25 MIL/uL — ABNORMAL LOW (ref 3.87–5.11)
RDW: 15.5 % (ref 11.5–15.5)
WBC: 10.1 10*3/uL (ref 4.0–10.5)

## 2013-09-19 NOTE — Pre-Procedure Instructions (Signed)
Wendy [name]  09/19/2013   Your procedure is scheduled on:  Thursday, September 21, 2013 @ 2:20 PM  Report to [facility] Short Stay (use Main Entrance "A'') at 12:20 PM.  Call this number if you have problems the morning of surgery: [phone]   Remember:   Do not eat food or drink liquids after midnight.   Take these medicines the morning of surgery with A SIP OF WATER: carvedilol (COREG) 12.5 MG tabletloratadine (CLARITIN) 10 MG tablet, omeprazole (PRILOSEC) 40 MG capsule, XIFAXAN 550 MG TABS tablet Olopatadine HCl (PATADAY) 0.2 % SOLN, cycloSPORINE (RESTASIS) 0.05 % ophthalmic emulsion,  If needed:clonazePAM (KLONOPIN) 1 MG tablet for anxiety, Oxycodone HCl 10 MG TABS for pain Stop taking Aspirin and herbal medications. Do not take any NSAIDs ie: Ibuprofen, Advil, Naproxen or any medication containing Aspirin.  Do not wear jewelry, make-up or nail polish.  Do not wear lotions, powders, or perfumes. You may wear deodorant.  Do not shave 48 hours prior to surgery.   Do not bring valuables to the hospital.  [facility] is not responsible for any belongings or valuables.   Contacts, dentures or bridgework may not be worn into surgery.  Leave suitcase in the car. After surgery it may be brought to your room.  For patients admitted to the hospital, discharge time is determined by your treatment team.               Patients discharged the day of surgery will not be allowed to drive home.  Name and phone number of your driver:   Special Instructions: Shower using CHG 2 nights before surgery and the night before surgery.  If you shower the day of surgery use CHG.  Use special wash - you have one bottle of CHG for all showers.  You should use approximately 1/3 of the bottle for each shower.  BRING BRACE ON DAY OF SURGERY  Please read over the following fact sheets that you were given: Pain Booklet, Coughing and Deep Breathing, MRSA Information and Surgical Site Infection Prevention

## 2013-09-20 ENCOUNTER — Telehealth: Payer: Self-pay | Admitting: Family Medicine

## 2013-09-20 NOTE — Telephone Encounter (Signed)
Pt called and would like Dr. Otis Dials to call her so that they can discuss her visit with the other doctor. jw

## 2013-09-20 NOTE — Progress Notes (Signed)
Anesthesia Chart Review:  Patient is a 53 year old female scheduled for kyphoplasty T12, L1 on 09/21/13 by Dr. Rolena Infante. History includes smoking, obesity, hepatitis C with cirrhosis, anxiety/panic disorder, ETOH abuse (now sober), depression, HTN, GERD, osteoporosis, splenectomy at age 85, SDH s/p burr hole, right hip fracture repair '09.  PCP is with [facility]. She was seen by Dr. Otis Dials on 08/21/13 for medical clearance. GI is Dr. Carlean Purl.  She was seen by Nicoletta Ba on 08/15/13 for GI preoperative clearance, and felt to be stable but at somewhat increased risk for any medical procedures due to her advanced cirrhosis.  She was seen by cardiologist Dr. Aundra Dubin for an abnormal EKG in June 2014 for evaluation prior to a planned right THA.  She ultimately had a and cleared from a cardiac standpoint following a non-ischemic stress test.  EKG on 02/01/13 showed SR, T wave abnormality, consider inferior ischemia.  She had a non-diagnostic stress echo on 12/29/12 due to failure to achieve an adequate HR.  Nuclear stress test on 05/30/13 showed: Normal stress nuclear study with a small, mild, fixed apical defect consistent with thinning; no ischemia. LV Ejection Fraction: 79%. LV Wall Motion: NL LV Function; NL Wall Motion. Normal study. May proceed to surgery without further testing.  CXR on 09/19/13 showed: Mild left basilar atelectasis. Otherwise, no acute chest findings. Hiatal hernia. Compression deformities at T12 and L1.  Preoperative labs noted.  Alk Phos, ALT, AST are elevated but at the higher end of her baseline since at least 04/2011. PLT is WNL. With cirrhosis history will add PT/PTT for the day of surgery. If results acceptable and otherwise no acute changes then I would anticipate that she could proceed as planned.  George Hugh [facility] Short Stay Center/Anesthesiology Phone [phone] 09/20/2013 10:11 AM

## 2013-09-20 NOTE — Telephone Encounter (Signed)
Spoke with patient.  She saw a hepatologist from [place], in a satellite office in [place] yesterday.  Pt cannot recall name.  Pt wanted Dr. Otis Dials to know and to make sure the MD's are all up to date on what is going on with her.  I instructed patient to make a F/U appt with Dr. Otis Dials and she can sign a ROI during that [place].   Pt verbalized understanding.  Kyley Solow, Loralyn [place], [place]

## 2013-09-21 ENCOUNTER — Encounter (HOSPITAL_COMMUNITY): Payer: Medicare Other | Admitting: Vascular Surgery

## 2013-09-21 ENCOUNTER — Ambulatory Visit (HOSPITAL_COMMUNITY): Payer: Medicare Other | Admitting: Anesthesiology

## 2013-09-21 ENCOUNTER — Encounter (HOSPITAL_COMMUNITY): Payer: Self-pay | Admitting: *Deleted

## 2013-09-21 ENCOUNTER — Encounter (HOSPITAL_COMMUNITY)
Admission: RE | Disposition: A | Discharge status: Home / Self Care | Payer: Self-pay | Source: Ambulatory Visit | Attending: Orthopedic Surgery

## 2013-09-21 ENCOUNTER — Observation Stay (HOSPITAL_COMMUNITY)
Admission: RE | Admit: 2013-09-21 | Discharge: 2013-09-22 | Disposition: A | Discharge status: Home / Self Care | Payer: Medicare Other | Source: Ambulatory Visit | Attending: Orthopedic Surgery | Admitting: Orthopedic Surgery

## 2013-09-21 ENCOUNTER — Ambulatory Visit (HOSPITAL_COMMUNITY): Payer: Medicare Other

## 2013-09-21 DIAGNOSIS — K449 Diaphragmatic hernia without obstruction or gangrene: Secondary | ICD-10-CM | POA: Insufficient documentation

## 2013-09-21 DIAGNOSIS — K222 Esophageal obstruction: Secondary | ICD-10-CM | POA: Insufficient documentation

## 2013-09-21 DIAGNOSIS — F4001 Agoraphobia with panic disorder: Secondary | ICD-10-CM | POA: Insufficient documentation

## 2013-09-21 DIAGNOSIS — J309 Allergic rhinitis, unspecified: Secondary | ICD-10-CM | POA: Insufficient documentation

## 2013-09-21 DIAGNOSIS — F1011 Alcohol abuse, in remission: Secondary | ICD-10-CM | POA: Insufficient documentation

## 2013-09-21 DIAGNOSIS — M810 Age-related osteoporosis without current pathological fracture: Secondary | ICD-10-CM | POA: Insufficient documentation

## 2013-09-21 DIAGNOSIS — F3289 Other specified depressive episodes: Secondary | ICD-10-CM | POA: Insufficient documentation

## 2013-09-21 DIAGNOSIS — M519 Unspecified thoracic, thoracolumbar and lumbosacral intervertebral disc disorder: Secondary | ICD-10-CM | POA: Diagnosis not present

## 2013-09-21 DIAGNOSIS — S32009A Unspecified fracture of unspecified lumbar vertebra, initial encounter for closed fracture: Secondary | ICD-10-CM | POA: Diagnosis not present

## 2013-09-21 DIAGNOSIS — M8448XA Pathological fracture, other site, initial encounter for fracture: Principal | ICD-10-CM | POA: Insufficient documentation

## 2013-09-21 DIAGNOSIS — K219 Gastro-esophageal reflux disease without esophagitis: Secondary | ICD-10-CM | POA: Insufficient documentation

## 2013-09-21 DIAGNOSIS — I10 Essential (primary) hypertension: Secondary | ICD-10-CM | POA: Insufficient documentation

## 2013-09-21 DIAGNOSIS — Z79899 Other long term (current) drug therapy: Secondary | ICD-10-CM | POA: Insufficient documentation

## 2013-09-21 DIAGNOSIS — D684 Acquired coagulation factor deficiency: Secondary | ICD-10-CM | POA: Insufficient documentation

## 2013-09-21 DIAGNOSIS — IMO0002 Reserved for concepts with insufficient information to code with codable children: Secondary | ICD-10-CM

## 2013-09-21 DIAGNOSIS — K7460 Unspecified cirrhosis of liver: Secondary | ICD-10-CM | POA: Insufficient documentation

## 2013-09-21 DIAGNOSIS — Z87891 Personal history of nicotine dependence: Secondary | ICD-10-CM | POA: Insufficient documentation

## 2013-09-21 DIAGNOSIS — B182 Chronic viral hepatitis C: Secondary | ICD-10-CM | POA: Insufficient documentation

## 2013-09-21 DIAGNOSIS — F329 Major depressive disorder, single episode, unspecified: Secondary | ICD-10-CM | POA: Insufficient documentation

## 2013-09-21 DIAGNOSIS — M79609 Pain in unspecified limb: Secondary | ICD-10-CM | POA: Insufficient documentation

## 2013-09-21 DIAGNOSIS — Z9889 Other specified postprocedural states: Secondary | ICD-10-CM

## 2013-09-21 DIAGNOSIS — S22009A Unspecified fracture of unspecified thoracic vertebra, initial encounter for closed fracture: Secondary | ICD-10-CM | POA: Diagnosis not present

## 2013-09-21 DIAGNOSIS — K759 Inflammatory liver disease, unspecified: Secondary | ICD-10-CM | POA: Diagnosis not present

## 2013-09-21 DIAGNOSIS — H269 Unspecified cataract: Secondary | ICD-10-CM | POA: Insufficient documentation

## 2013-09-21 DIAGNOSIS — R262 Difficulty in walking, not elsewhere classified: Secondary | ICD-10-CM | POA: Insufficient documentation

## 2013-09-21 HISTORY — DX: Reserved for concepts with insufficient information to code with codable children: IMO0002

## 2013-09-21 HISTORY — PX: KYPHOPLASTY: SHX5884

## 2013-09-21 LAB — PROTIME-INR
INR: 1.28 (ref 0.00–1.49)
Prothrombin Time: 15.7 seconds — ABNORMAL HIGH (ref 11.6–15.2)

## 2013-09-21 LAB — APTT: APTT: 32 s (ref 24–37)

## 2013-09-21 SURGERY — KYPHOPLASTY
Anesthesia: General | Laterality: Bilateral

## 2013-09-21 MED ORDER — PHENYLEPHRINE HCL 10 MG/ML IJ SOLN
10.0000 mg | INTRAVENOUS | Status: DC | PRN
  Administered 2013-09-21: 25 ug/min via INTRAVENOUS

## 2013-09-21 MED ORDER — PHENYLEPHRINE HCL 10 MG/ML IJ SOLN
INTRAMUSCULAR | Status: DC | PRN
  Administered 2013-09-21 (×3): 80 ug via INTRAVENOUS
  Administered 2013-09-21: 160 ug via INTRAVENOUS

## 2013-09-21 MED ORDER — PROPOFOL 10 MG/ML IV BOLUS
INTRAVENOUS | Status: DC | PRN
  Administered 2013-09-21: 200 mg via INTRAVENOUS

## 2013-09-21 MED ORDER — RIFAXIMIN 550 MG PO TABS
550.0000 mg | ORAL_TABLET | Freq: Two times a day (BID) | ORAL | Status: DC
  Administered 2013-09-21 – 2013-09-22 (×2): 550 mg via ORAL
  Filled 2013-09-21 (×3): qty 1

## 2013-09-21 MED ORDER — IOHEXOL 300 MG/ML  SOLN
INTRAMUSCULAR | Status: DC | PRN
  Administered 2013-09-21: 40 mL

## 2013-09-21 MED ORDER — OXYCODONE HCL 5 MG/5ML PO SOLN: 5.0000 mg | Freq: Once | ORAL | Status: AC | PRN

## 2013-09-21 MED ORDER — CLONAZEPAM 1 MG PO TABS
1.0000 mg | ORAL_TABLET | Freq: Three times a day (TID) | ORAL | Status: DC | PRN
  Administered 2013-09-21 – 2013-09-22 (×2): 1 mg via ORAL
  Filled 2013-09-21 (×2): qty 1

## 2013-09-21 MED ORDER — 0.9 % SODIUM CHLORIDE (POUR BTL) OPTIME
TOPICAL | Status: DC | PRN
  Administered 2013-09-21: 1000 mL

## 2013-09-21 MED ORDER — SODIUM CHLORIDE 0.9 % IJ SOLN
3.0000 mL | Freq: Two times a day (BID) | INTRAMUSCULAR | Status: DC
  Administered 2013-09-22: 3 mL via INTRAVENOUS

## 2013-09-21 MED ORDER — SODIUM CHLORIDE 0.9 % IJ SOLN: 3.0000 mL | INTRAMUSCULAR | Status: DC | PRN

## 2013-09-21 MED ORDER — MENTHOL 3 MG MT LOZG: 1.0000 | LOZENGE | OROMUCOSAL | Status: DC | PRN

## 2013-09-21 MED ORDER — LACTATED RINGERS IV SOLN
INTRAVENOUS | Status: DC
  Administered 2013-09-21: 50 mL/h via INTRAVENOUS

## 2013-09-21 MED ORDER — FENTANYL CITRATE 0.05 MG/ML IJ SOLN
INTRAMUSCULAR | Status: DC | PRN
  Administered 2013-09-21: 100 ug via INTRAVENOUS

## 2013-09-21 MED ORDER — DEXAMETHASONE SODIUM PHOSPHATE 4 MG/ML IJ SOLN
INTRAMUSCULAR | Status: DC | PRN
  Administered 2013-09-21: 4 mg via INTRAVENOUS

## 2013-09-21 MED ORDER — OXYCODONE HCL 5 MG PO TABS
ORAL_TABLET | ORAL | Status: AC
Start: 2013-09-21 — End: 2013-09-22
  Filled 2013-09-21: qty 1

## 2013-09-21 MED ORDER — OXYCODONE HCL 5 MG PO TABS: 10.0000 mg | ORAL_TABLET | ORAL | Status: DC | PRN

## 2013-09-21 MED ORDER — CEFAZOLIN SODIUM-DEXTROSE 2-3 GM-% IV SOLR
INTRAVENOUS | Status: DC | PRN
Start: 2013-09-21 — End: 2013-09-22
  Administered 2013-09-21: 2 g via INTRAVENOUS

## 2013-09-21 MED ORDER — LIDOCAINE HCL (CARDIAC) 20 MG/ML IV SOLN
INTRAVENOUS | Status: AC
  Filled 2013-09-21: qty 5

## 2013-09-21 MED ORDER — FUROSEMIDE 40 MG PO TABS
40.0000 mg | ORAL_TABLET | Freq: Every day | ORAL | Status: DC
  Administered 2013-09-21 – 2013-09-22 (×2): 40 mg via ORAL
  Filled 2013-09-21 (×2): qty 1

## 2013-09-21 MED ORDER — CARBOXYMETHYLCELLULOSE SODIUM 1 % OP SOLN: 1.0000 [drp] | Freq: Two times a day (BID) | OPHTHALMIC | Status: DC

## 2013-09-21 MED ORDER — SPIRONOLACTONE 100 MG PO TABS
100.0000 mg | ORAL_TABLET | Freq: Every day | ORAL | Status: DC
  Administered 2013-09-21 – 2013-09-22 (×2): 100 mg via ORAL
  Filled 2013-09-21 (×2): qty 1

## 2013-09-21 MED ORDER — CYCLOSPORINE 0.05 % OP EMUL
1.0000 [drp] | Freq: Two times a day (BID) | OPHTHALMIC | Status: DC
  Administered 2013-09-21 – 2013-09-22 (×2): 1 [drp] via OPHTHALMIC
  Filled 2013-09-21 (×3): qty 1

## 2013-09-21 MED ORDER — CARVEDILOL 12.5 MG PO TABS
12.5000 mg | ORAL_TABLET | Freq: Two times a day (BID) | ORAL | Status: DC
  Administered 2013-09-22: 12.5 mg via ORAL
  Filled 2013-09-21 (×3): qty 1

## 2013-09-21 MED ORDER — FENTANYL CITRATE 0.05 MG/ML IJ SOLN
INTRAMUSCULAR | Status: AC
  Filled 2013-09-21: qty 5

## 2013-09-21 MED ORDER — POLYVINYL ALCOHOL 1.4 % OP SOLN
1.0000 [drp] | Freq: Two times a day (BID) | OPHTHALMIC | Status: DC
  Administered 2013-09-21 – 2013-09-22 (×2): 1 [drp] via OPHTHALMIC
  Filled 2013-09-21: qty 15

## 2013-09-21 MED ORDER — LORATADINE 10 MG PO TABS
10.0000 mg | ORAL_TABLET | Freq: Every day | ORAL | Status: DC
  Administered 2013-09-21 – 2013-09-22 (×2): 10 mg via ORAL
  Filled 2013-09-21 (×2): qty 1

## 2013-09-21 MED ORDER — BUPIVACAINE-EPINEPHRINE 0.25% -1:200000 IJ SOLN
INTRAMUSCULAR | Status: DC | PRN
  Administered 2013-09-21: 10 mL
  Administered 2013-09-21: 8 mL

## 2013-09-21 MED ORDER — MIDAZOLAM HCL 5 MG/5ML IJ SOLN
INTRAMUSCULAR | Status: DC | PRN
  Administered 2013-09-21: 2 mg via INTRAVENOUS

## 2013-09-21 MED ORDER — PROPOFOL 10 MG/ML IV BOLUS
INTRAVENOUS | Status: AC
  Filled 2013-09-21: qty 20

## 2013-09-21 MED ORDER — ZOLPIDEM TARTRATE 5 MG PO TABS: 5.0000 mg | ORAL_TABLET | Freq: Every evening | ORAL | Status: DC | PRN

## 2013-09-21 MED ORDER — METHOCARBAMOL 100 MG/ML IJ SOLN
500.0000 mg | Freq: Four times a day (QID) | INTRAVENOUS | Status: DC | PRN
  Filled 2013-09-21: qty 5

## 2013-09-21 MED ORDER — ONDANSETRON HCL 4 MG/2ML IJ SOLN
4.0000 mg | INTRAMUSCULAR | Status: DC | PRN
Start: 2013-09-21 — End: 2013-09-22

## 2013-09-21 MED ORDER — DEXAMETHASONE SODIUM PHOSPHATE 4 MG/ML IJ SOLN
4.0000 mg | Freq: Four times a day (QID) | INTRAMUSCULAR | Status: DC
  Filled 2013-09-21 (×7): qty 1

## 2013-09-21 MED ORDER — CEFAZOLIN SODIUM-DEXTROSE 2-3 GM-% IV SOLR
INTRAVENOUS | Status: AC
  Filled 2013-09-21: qty 50

## 2013-09-21 MED ORDER — DEXAMETHASONE SODIUM PHOSPHATE 4 MG/ML IJ SOLN
INTRAMUSCULAR | Status: AC
  Filled 2013-09-21: qty 1

## 2013-09-21 MED ORDER — LACTATED RINGERS IV SOLN
INTRAVENOUS | Status: DC | PRN
  Administered 2013-09-21 (×2): via INTRAVENOUS

## 2013-09-21 MED ORDER — SUCCINYLCHOLINE CHLORIDE 20 MG/ML IJ SOLN
INTRAMUSCULAR | Status: DC | PRN
  Administered 2013-09-21: 140 mg via INTRAVENOUS

## 2013-09-21 MED ORDER — ACETAMINOPHEN 10 MG/ML IV SOLN: 1000.0000 mg | Freq: Four times a day (QID) | INTRAVENOUS | Status: DC

## 2013-09-21 MED ORDER — MIDAZOLAM HCL 2 MG/2ML IJ SOLN
INTRAMUSCULAR | Status: AC
  Filled 2013-09-21: qty 2

## 2013-09-21 MED ORDER — LIDOCAINE HCL (CARDIAC) 20 MG/ML IV SOLN
INTRAVENOUS | Status: DC | PRN
  Administered 2013-09-21: 60 mg via INTRAVENOUS

## 2013-09-21 MED ORDER — HYDROMORPHONE HCL PF 1 MG/ML IJ SOLN
INTRAMUSCULAR | Status: AC
  Filled 2013-09-21: qty 1

## 2013-09-21 MED ORDER — QUETIAPINE FUMARATE 200 MG PO TABS
200.0000 mg | ORAL_TABLET | Freq: Every day | ORAL | Status: DC
Start: 2013-09-21 — End: 2013-09-22
  Administered 2013-09-21: 200 mg via ORAL
  Filled 2013-09-21 (×2): qty 1

## 2013-09-21 MED ORDER — SODIUM CHLORIDE 0.9 % IV SOLN: 250.0000 mL | INTRAVENOUS | Status: DC

## 2013-09-21 MED ORDER — PHENYLEPHRINE HCL 10 MG/ML IJ SOLN
INTRAMUSCULAR | Status: AC
  Filled 2013-09-21: qty 1

## 2013-09-21 MED ORDER — HYDROMORPHONE HCL PF 1 MG/ML IJ SOLN
0.2500 mg | INTRAMUSCULAR | Status: DC | PRN
  Administered 2013-09-21 (×2): 0.5 mg via INTRAVENOUS

## 2013-09-21 MED ORDER — METOCLOPRAMIDE HCL 5 MG/ML IJ SOLN: 10.0000 mg | Freq: Once | INTRAMUSCULAR | Status: DC | PRN

## 2013-09-21 MED ORDER — OLOPATADINE HCL 0.1 % OP SOLN
1.0000 [drp] | Freq: Two times a day (BID) | OPHTHALMIC | Status: DC
  Administered 2013-09-21 – 2013-09-22 (×2): 1 [drp] via OPHTHALMIC
  Filled 2013-09-21: qty 5

## 2013-09-21 MED ORDER — PHENOL 1.4 % MT LIQD: 1.0000 | OROMUCOSAL | Status: DC | PRN

## 2013-09-21 MED ORDER — METHOCARBAMOL 500 MG PO TABS
500.0000 mg | ORAL_TABLET | Freq: Four times a day (QID) | ORAL | Status: DC | PRN
  Administered 2013-09-21: 500 mg via ORAL
  Filled 2013-09-21: qty 1

## 2013-09-21 MED ORDER — LACTULOSE 10 GM/15ML PO SOLN
10.0000 g | Freq: Two times a day (BID) | ORAL | Status: DC | PRN
  Filled 2013-09-21: qty 15

## 2013-09-21 MED ORDER — METHOCARBAMOL 500 MG PO TABS
ORAL_TABLET | ORAL | Status: AC
  Filled 2013-09-21: qty 1

## 2013-09-21 MED ORDER — OXYCODONE HCL 5 MG PO TABS
5.0000 mg | ORAL_TABLET | Freq: Once | ORAL | Status: AC | PRN
  Administered 2013-09-21: 5 mg via ORAL

## 2013-09-21 MED ORDER — DEXAMETHASONE 4 MG PO TABS
4.0000 mg | ORAL_TABLET | Freq: Four times a day (QID) | ORAL | Status: DC
  Administered 2013-09-21 – 2013-09-22 (×3): 4 mg via ORAL
  Filled 2013-09-21 (×7): qty 1

## 2013-09-21 MED ORDER — CEFAZOLIN SODIUM 1-5 GM-% IV SOLN
1.0000 g | Freq: Three times a day (TID) | INTRAVENOUS | Status: AC
  Administered 2013-09-21 – 2013-09-22 (×2): 1 g via INTRAVENOUS
  Filled 2013-09-21 (×2): qty 50

## 2013-09-21 MED ORDER — HYDROXYZINE HCL 25 MG PO TABS: 25.0000 mg | ORAL_TABLET | Freq: Four times a day (QID) | ORAL | Status: DC | PRN

## 2013-09-21 MED ORDER — LACTATED RINGERS IV SOLN: INTRAVENOUS | Status: DC

## 2013-09-21 MED ORDER — BUPIVACAINE-EPINEPHRINE (PF) 0.25% -1:200000 IJ SOLN
INTRAMUSCULAR | Status: AC
  Filled 2013-09-21: qty 30

## 2013-09-21 SURGICAL SUPPLY — 50 items
BANDAGE ADHESIVE 1X3 (GAUZE/BANDAGES/DRESSINGS) ×3 IMPLANT
BLADE SURG 15 STRL LF DISP TIS (BLADE) ×1 IMPLANT
BLADE SURG 15 STRL SS (BLADE) ×2
CEMENT BONE KYPHX HV R (Orthopedic Implant) ×6 IMPLANT
CEMENT KYPHON C01A KIT/MIXER (Cement) ×6 IMPLANT
CLOTH BEACON ORANGE TIMEOUT ST (SAFETY) IMPLANT
COVER MAYO STAND STRL (DRAPES) ×3 IMPLANT
CURETTE WEDGE 8.5MM KYPHX (MISCELLANEOUS) ×3 IMPLANT
DERMABOND ADVANCED (GAUZE/BANDAGES/DRESSINGS) ×2
DERMABOND ADVANCED .7 DNX12 (GAUZE/BANDAGES/DRESSINGS) ×1 IMPLANT
DRAPE C-ARM 42X72 X-RAY (DRAPES) ×6 IMPLANT
DRAPE INCISE IOBAN 66X45 STRL (DRAPES) ×3 IMPLANT
DRAPE LAPAROTOMY T 102X78X121 (DRAPES) ×3 IMPLANT
DRAPE PROXIMA HALF (DRAPES) IMPLANT
DRSG MEPILEX BORDER 4X4 (GAUZE/BANDAGES/DRESSINGS) ×6 IMPLANT
DRSG MEPILEX BORDER 4X8 (GAUZE/BANDAGES/DRESSINGS) IMPLANT
DURAPREP 26ML APPLICATOR (WOUND CARE) ×3 IMPLANT
GAUZE SPONGE 4X4 16PLY XRAY LF (GAUZE/BANDAGES/DRESSINGS) ×3 IMPLANT
GLOVE BIOGEL PI IND STRL 6.5 (GLOVE) ×1 IMPLANT
GLOVE BIOGEL PI IND STRL 7.0 (GLOVE) ×1 IMPLANT
GLOVE BIOGEL PI IND STRL 8 (GLOVE) ×1 IMPLANT
GLOVE BIOGEL PI INDICATOR 6.5 (GLOVE) ×2
GLOVE BIOGEL PI INDICATOR 7.0 (GLOVE) ×2
GLOVE BIOGEL PI INDICATOR 8 (GLOVE) ×2
GLOVE ECLIPSE 6.5 STRL STRAW (GLOVE) ×6 IMPLANT
GLOVE ECLIPSE 8.5 STRL (GLOVE) ×3 IMPLANT
GLOVE ORTHO TXT STRL SZ7.5 (GLOVE) IMPLANT
GOWN STRL NON-REIN LRG LVL3 (GOWN DISPOSABLE) IMPLANT
GOWN STRL REIN 2XL XLG LVL4 (GOWN DISPOSABLE) IMPLANT
GOWN STRL REUS W/ TWL LRG LVL3 (GOWN DISPOSABLE) ×3 IMPLANT
GOWN STRL REUS W/TWL 2XL LVL3 (GOWN DISPOSABLE) ×3 IMPLANT
GOWN STRL REUS W/TWL LRG LVL3 (GOWN DISPOSABLE) ×6
KIT BASIN OR (CUSTOM PROCEDURE TRAY) ×3 IMPLANT
KIT ROOM TURNOVER OR (KITS) ×3 IMPLANT
NEEDLE HYPO 25X1 1.5 SAFETY (NEEDLE) ×3 IMPLANT
NEEDLE SPNL 18GX3.5 QUINCKE PK (NEEDLE) ×6 IMPLANT
NS IRRIG 1000ML POUR BTL (IV SOLUTION) ×3 IMPLANT
PACK SURGICAL SETUP 50X90 (CUSTOM PROCEDURE TRAY) ×3 IMPLANT
PACK UNIVERSAL I (CUSTOM PROCEDURE TRAY) ×3 IMPLANT
PAD ARMBOARD 7.5X6 YLW CONV (MISCELLANEOUS) ×6 IMPLANT
SURGIFLO TRUKIT (HEMOSTASIS) IMPLANT
SUT MON AB 3-0 SH 27 (SUTURE) ×2
SUT MON AB 3-0 SH27 (SUTURE) ×1 IMPLANT
SYR BULB IRRIGATION 50ML (SYRINGE) ×3 IMPLANT
SYR CONTROL 10ML LL (SYRINGE) ×3 IMPLANT
TOWEL OR 17X24 6PK STRL BLUE (TOWEL DISPOSABLE) ×3 IMPLANT
TOWEL OR 17X26 10 PK STRL BLUE (TOWEL DISPOSABLE) ×3 IMPLANT
TRAY KYPHOPAK 15/3 ONESTEP 1ST (MISCELLANEOUS) IMPLANT
TRAY KYPHOPAK 20/3 ONESTEP 1ST (MISCELLANEOUS) ×3 IMPLANT
WATER STERILE IRR 1000ML POUR (IV SOLUTION) IMPLANT

## 2013-09-21 NOTE — Brief Op Note (Signed)
09/21/2013  4:09 PM  PATIENT:  Wendy [name]  53 y.o. female  PRE-OPERATIVE DIAGNOSIS:  T-12 and L1 Compression fracture  POST-OPERATIVE DIAGNOSIS:  T-12 and L1 Compression fracture  PROCEDURE:  Procedure(s): T12 - L1 KYPHOPLASTY (Bilateral)  SURGEON:  Surgeon(s) and Role:    * Melina Schools, MD - Primary  PHYSICIAN ASSISTANT:   ASSISTANTS: none   ANESTHESIA:   general  EBL:  Total I/O In: 1000 [I.V.:1000] Out: -   BLOOD ADMINISTERED: none   DRAINS: none   LOCAL MEDICATIONS USED:  MARCAINE     SPECIMEN:  No Specimen  DISPOSITION OF SPECIMEN:  N/A  COUNTS:  YES  TOURNIQUET:  * No tourniquets in log *  DICTATION: .Other Dictation: Dictation Number [id number]  PLAN OF CARE: Admit for overnight observation  PATIENT DISPOSITION:  PACU - hemodynamically stable.

## 2013-09-21 NOTE — Anesthesia Procedure Notes (Signed)
Procedure Name: Intubation Date/Time: 09/21/2013 2:14 PM Performed by: Daisy Lazar Pre-anesthesia Checklist: Patient identified, Emergency Drugs available, Suction available and Patient being monitored Patient Re-evaluated:Patient Re-evaluated prior to inductionOxygen Delivery Method: Circle system utilized Preoxygenation: Pre-oxygenation with 100% oxygen Intubation Type: IV induction Ventilation: Mask ventilation without difficulty Laryngoscope Size: Mac and 3 Grade View: Grade I Tube type: Oral Tube size: 7.5 mm Number of attempts: 1 Airway Equipment and Method: Patient positioned with wedge pillow Placement Confirmation: ETT inserted through vocal cords under direct vision,  positive ETCO2 and breath sounds checked- equal and bilateral Secured at: 21 cm Tube secured with: Tape Dental Injury: Teeth and Oropharynx as per pre-operative assessment

## 2013-09-21 NOTE — Transfer of Care (Signed)
Immediate Anesthesia Transfer of Care Note  Patient: Wendy [name]  Procedure(s) Performed: Procedure(s): T12 - L1 KYPHOPLASTY (Bilateral)  Patient Location: PACU  Anesthesia Type:General  Level of Consciousness: awake, alert  and oriented  Airway & Oxygen Therapy: Patient Spontanous Breathing and Patient connected to nasal cannula oxygen  Post-op Assessment: Report given to PACU RN and Post -op Vital signs reviewed and stable  Post vital signs: Reviewed and stable  Complications: No apparent anesthesia complications

## 2013-09-21 NOTE — Anesthesia Preprocedure Evaluation (Signed)
Anesthesia Evaluation  Patient identified by MRN, date of birth, ID band Patient awake    Reviewed: Allergy & Precautions, H&P , NPO status , Patient's Chart, lab work & pertinent test results, reviewed documented beta blocker date and time   Airway Mallampati: II TM Distance: >3 FB Neck ROM: full    Dental   Pulmonary shortness of breath, Current Smoker,  breath sounds clear to auscultation        Cardiovascular hypertension, On Medications and On Home Beta Blockers Rhythm:regular     Neuro/Psych PSYCHIATRIC DISORDERS Anxiety Depression  Neuromuscular disease    GI/Hepatic hiatal hernia, GERD-  Medicated and Controlled,(+)     substance abuse  alcohol use, Hepatitis -, C  Endo/Other  negative endocrine ROS  Renal/GU negative Renal ROS  negative genitourinary   Musculoskeletal   Abdominal   Peds  Hematology negative hematology ROS (+)   Anesthesia Other Findings See surgeon's H&P   Reproductive/Obstetrics negative OB ROS                           Anesthesia Physical Anesthesia Plan  ASA: III  Anesthesia Plan: General   Post-op Pain Management:    Induction: Intravenous  Airway Management Planned: Oral ETT  Additional Equipment:   Intra-op Plan:   Post-operative Plan: Extubation in OR  Informed Consent: I have reviewed the patients History and Physical, chart, labs and discussed the procedure including the risks, benefits and alternatives for the proposed anesthesia with the patient or authorized representative who has indicated his/her understanding and acceptance.   Dental Advisory Given  Plan Discussed with: CRNA and Surgeon  Anesthesia Plan Comments:         Anesthesia Quick Evaluation

## 2013-09-21 NOTE — Progress Notes (Signed)
Patient ambulated from bed to [facility], voided x2, ate 75% of dinner, tolerated all well.  Nsg to continue to monitor for status changes.

## 2013-09-21 NOTE — H&P (Signed)
No change in clinical exam H+P reviewed  

## 2013-09-21 NOTE — Preoperative (Signed)
Beta Blockers   Reason not to administer Beta Blockers:Not Applicable 

## 2013-09-21 NOTE — Anesthesia Postprocedure Evaluation (Signed)
Anesthesia Post Note  Patient: Wendy [name]  Procedure(s) Performed: Procedure(s) (LRB): T12 - L1 KYPHOPLASTY (Bilateral)  Anesthesia type: General  Patient location: PACU  Post pain: Pain level controlled  Post assessment: Patient's Cardiovascular Status Stable  Last Vitals:  Filed Vitals:   09/21/13 1614  BP:   Pulse:   Temp: 36 C  Resp:     Post vital signs: Reviewed and stable  Level of consciousness: alert  Complications: No apparent anesthesia complications

## 2013-09-22 ENCOUNTER — Encounter (HOSPITAL_COMMUNITY): Payer: Self-pay | Admitting: Orthopedic Surgery

## 2013-09-22 MED ORDER — ONDANSETRON 4 MG PO TBDP: 4.0000 mg | ORAL_TABLET | Freq: Three times a day (TID) | ORAL | Status: DC | PRN

## 2013-09-22 MED ORDER — DOCUSATE SODIUM 100 MG PO CAPS: 100.0000 mg | ORAL_CAPSULE | Freq: Two times a day (BID) | ORAL | Status: DC

## 2013-09-22 MED ORDER — OXYCODONE HCL 10 MG PO TABS: 10.0000 mg | ORAL_TABLET | Freq: Three times a day (TID) | ORAL | Status: DC | PRN

## 2013-09-22 NOTE — Evaluation (Signed)
Physical Therapy Evaluation Patient Details Name: Wendy [name] MRN: [id number] DOB: 11-Mar-1961 Today's Date: 09/22/2013 Time:  -     PT Assessment / Plan / Recommendation History of Present Illness  T12 - L1 KYPHOPLASTY (Bilateral  Clinical Impression  Pt. Presents to PT s/p kyphoplasty with decreased independence and with need of RW for maximum stability in her mobility.  She has 24 hour assist at home and is mobiliizing at supervision level currently.  All education is completed and Dc orders for home have been written.  Will sign off     PT Assessment  All further PT needs can be met in the next venue of care    Follow Up Recommendations  Home health PT;Supervision/Assistance - 24 hour (HHPT for safety eval)    Does the patient have the potential to tolerate intense rehabilitation      Barriers to Discharge        Equipment Recommendations  None recommended by PT    Recommendations for Other Services     Frequency      Precautions / Restrictions Precautions Precautions: Fall;Back Precaution Booklet Issued: Yes (comment) Precaution Comments: educated pt. and her friend Sandrea Matte on back precautions and provided handout Required Braces or Orthoses: Spinal Brace Spinal Brace: Lumbar corset;Applied in sitting position Restrictions Weight Bearing Restrictions: No   Pertinent Vitals/Pain See vitals tab       Mobility  Bed Mobility Overal bed mobility: Needs Assistance Bed Mobility: Rolling;Sidelying to Sit Rolling: Supervision Sidelying to sit: Supervision General bed mobility comments: VCs for sequence Transfers Overall transfer level: Needs assistance Equipment used: Rolling walker (2 wheeled) Transfers: Sit to/from Stand Sit to Stand: Supervision General transfer comment: for safety and safe hand placement Ambulation/Gait Ambulation/Gait assistance: Supervision Ambulation Distance (Feet): 125 Feet Assistive device: Rolling walker (2 wheeled) Gait  Pattern/deviations: Step-to pattern;Decreased step length - right;Decreased step length - left    Exercises     PT Diagnosis: Difficulty walking  PT Problem List: Decreased activity tolerance;Decreased mobility;Decreased knowledge of use of DME;Decreased knowledge of precautions;Pain PT Treatment Interventions:       PT Goals(Current goals can be found in the care plan section) Acute Rehab PT Goals Patient Stated Goal: Home today  Visit Information  Last PT Received On: 09/22/13 Assistance Needed: +1 PT/OT/SLP Co-Evaluation/Treatment: Yes Reason for Co-Treatment: Necessary to address cognition/behavior during functional activity PT goals addressed during session: Mobility/safety with mobility;Proper use of DME History of Present Illness: T12 - L1 KYPHOPLASTY (Bilateral       [place] expects to be discharged to:: Private residence Living Arrangements: Non-relatives/Friends Available Help at Discharge: Friend(s);Available 24 hours/day Type of Home: House Home Access: Level entry Home Layout: One level Home Equipment: Walker - 2 wheels;Cane - single point;Bedside commode;Shower seat Prior Function Level of Independence: Needs assistance Gait / Transfers Assistance Needed: recent difficulty mobilizing needing min assist ADL's / Homemaking Assistance Needed: LBADLs Communication Communication: No difficulties Dominant Hand: Right    Cognition  Cognition Arousal/Alertness: Awake/alert Behavior During Therapy: WFL for tasks assessed/performed Overall Cognitive Status: History of cognitive impairments - at baseline (pt. looses train of thought and goes on tangents) Memory: Decreased recall of precautions;Decreased short-term memory    Extremity/Trunk Assessment Upper Extremity Assessment Upper Extremity Assessment: Overall WFL for tasks assessed Lower Extremity Assessment Lower Extremity Assessment: Overall WFL for tasks assessed    Balance    End of Session PT - End of Session Equipment Utilized During Treatment: Gait belt Activity Tolerance:  Patient tolerated treatment well Patient left: in chair;with call bell/phone within reach Nurse Communication: Mobility status  GP     Ladona Ridgel 09/22/2013, 2:21 PM Gerlean Ren PT Acute Rehab Services [place] [phone]

## 2013-09-22 NOTE — Care Management Note (Signed)
CARE MANAGEMENT NOTE 09/22/2013  Patient:  Wendy [name], Wendy [name]   Account Number:  [id number]  Date Initiated:  09/22/2013  Documentation initiated by:  Ricki Miller  Subjective/Objective Assessment:   53 yr old female s/p T12-L1 kyphoplasty     Action/Plan:   Case Manager spoke with patient concerning home health needs.Patient states she uses [facility]. Contacted Debbie Barnes,RN [facility] . Patient needs PT safety eval.   Anticipated DC Date:  09/22/2013   Anticipated DC Plan:  [place]  CM consult      [facility] Choice  HOME HEALTH   Choice offered to / List presented to:  C-1 Patient   DME arranged  NA        [place] arranged  NA      Status of service:  Completed, signed off Medicare Important Message given?   (If response is "NO", the following Medicare IM given date fields will be blank) Date Medicare IM given:   Date Additional Medicare IM given:    Discharge Disposition:  [place]

## 2013-09-22 NOTE — Discharge Summary (Signed)
Patient ID: Wendy [name] MRN: [id number] DOB/AGE: 1961-01-07 53 y.o.  Admit date: 09/21/2013 Discharge date: 09/22/2013  Admission Diagnoses:  Active Problems:   Compression fracture   Discharge Diagnoses:  Active Problems:   Compression fracture  status post Procedure(s): T12 - L1 KYPHOPLASTY  Past Medical History  Diagnosis Date  . Cirrhosis   . Hiatal hernia   . Esophageal stricture   . Anxiety disorder   . 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 Hepatitis C, chronic   . History of alcohol abuse   . Depression   . History of hip fracture   . Allergic rhinitis   . HTN (hypertension)   . Anxiety   . Blood transfusion   . Cataract     MILD  . GERD (gastroesophageal reflux disease)   . Osteoporosis   . Ulcer   . Arthritis   . Blood transfusion without reported diagnosis 2010 & 1976  . 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 female who was admitted 09/21/2013 for operative treatment of <principal problem not specified>. Patient failed conservative treatments (please see the history and physical for the specifics) and had severe unremitting pain that affects sleep, daily activities and work/hobbies. After pre-op clearance, the patient was taken to the operating room on 09/21/2013 and underwent  Procedure(s): T12 - L1 KYPHOPLASTY.    Patient was given perioperative antibiotics: Anti-infectives   Start     Dose/Rate Route Frequency Ordered Stop   09/21/13 2200  rifaximin (XIFAXAN) tablet 550 mg     550 mg Oral 2 times daily 09/21/13 1750     09/21/13 2200  ceFAZolin (ANCEF) IVPB 1 g/50 mL premix     1 g 100 mL/hr over 30 Minutes Intravenous Every 8 hours 09/21/13 1750 09/22/13 0527       Patient was given sequential compression devices  and early ambulation to prevent DVT.   Patient benefited maximally from hospital stay and there were no complications. At the time of discharge, the patient was urinating/moving their bowels without difficulty, tolerating a regular diet, pain is controlled with oral pain medications and they have been cleared by PT/OT.   Recent vital signs: Patient Vitals for the past 24 hrs:  BP Temp Temp src Pulse Resp SpO2  09/22/13 0826 124/76 mmHg - - 90 - -  09/22/13 0538 112/66 mmHg 98.6 F (37 C) Oral 88 18 97 %  09/21/13 2027 104/69 mmHg 97.6 F (36.4 C) Oral 91 - 98 %  09/21/13 1735 146/96 mmHg 97.5 F (36.4 C) - 87 16 100 %  09/21/13 1715 142/83 mmHg 97 F (36.1 C) - 89 14 99 %  09/21/13 1714 125/70 mmHg - - 88 13 100 %  09/21/13 1713 - - - 87 12 100 %  09/21/13 1647 117/60 mmHg - - 87 12 100 %  09/21/13 1630 150/80 mmHg - - 90 19 99 %  09/21/13 1620 - - - 87 11 100 %  09/21/13 1615 142/118 mmHg - - 87 15 99 %  09/21/13 1614 - 96.8 F (36 C) - - - -  09/21/13 1231 128/83 mmHg - - - - -  09/21/13 1201 - 98.7 F (37.1 C) Oral 93 20 95 %     Recent  laboratory studies:  Recent Labs  09/19/13 1153 09/21/13 1206  WBC 10.1  --   HGB 13.4  --   HCT 36.4  --   PLT 177  --   NA 135*  --   K 5.4*  --   CL 99  --   CO2 25  --   BUN 18  --   CREATININE 0.95  --   GLUCOSE 113*  --   INR  --  1.28  CALCIUM 8.9  --      Discharge Medications:     Medication List         calcium-vitamin D 500-400 MG-UNIT per tablet  Commonly known as:  OSCAL-500  2 tablets 2 (two) times daily.     carvedilol 12.5 MG tablet  Commonly known as:  COREG  Take 1 tablet (12.5 mg total) by mouth 2 (two) times daily with a meal.     clonazePAM 1 MG tablet  Commonly known as:  KLONOPIN  Take 1 tablet (1 mg total) by mouth 3 (three) times daily as needed for anxiety.     cyclobenzaprine 10 MG tablet  Commonly known as:  FLEXERIL  Take 1 tablet (10 mg total) by mouth 3 (three) times daily as needed  for muscle spasms.     cycloSPORINE 0.05 % ophthalmic emulsion  Commonly known as:  RESTASIS  Place 1 drop into both eyes 2 (two) times daily.     docusate sodium 100 MG capsule  Commonly known as:  COLACE  Take 1 capsule (100 mg total) by mouth 2 (two) times daily.     FOSAMAX 70 MG tablet  Generic drug:  alendronate  Take 70 mg by mouth every Thursday. Take with a full glass of water on an empty stomach.     furosemide 40 MG tablet  Commonly known as:  LASIX  Take 1 tablet (40 mg total) by mouth daily.     hydrOXYzine 25 MG tablet  Commonly known as:  ATARAX/VISTARIL  Take 25 mg by mouth 4 (four) times daily as needed for itching.     lactulose 10 GM/15ML solution  Commonly known as:  CHRONULAC  Take 15 mLs (10 g total) by mouth 2 (two) times daily as needed (for constipation).     loratadine 10 MG tablet  Commonly known as:  CLARITIN  Take 1 tablet (10 mg total) by mouth daily.     Olopatadine HCl 0.2 % Soln  Place 1 drop into both eyes daily.     omeprazole 40 MG capsule  Commonly known as:  PRILOSEC  Take 1 capsule (40 mg total) by mouth daily.     ondansetron 4 MG disintegrating tablet  Commonly known as:  ZOFRAN ODT  Take 1 tablet (4 mg total) by mouth every 8 (eight) hours as needed for nausea or vomiting.     Oxycodone HCl 10 MG Tabs  Take 1 tablet (10 mg total) by mouth 3 (three) times daily as needed (for pain).     PRENATAL 28-0.8 MG Tabs  Take 1 tablet by mouth daily.     QUEtiapine 200 MG tablet  Commonly known as:  SEROQUEL  Take 200 mg by mouth at bedtime.     REFRESH 1 % ophthalmic solution  Generic drug:  carboxymethylcellulose  Apply 1 drop to eye 2 (two) times daily.     rifaximin 550 MG Tabs tablet  Commonly known as:  XIFAXAN  Take 550 mg by mouth 2 (two)  times daily.     spironolactone 100 MG tablet  Commonly known as:  ALDACTONE  Take 1 tablet (100 mg total) by mouth daily.        Diagnostic Studies: Dg Chest 2  View  09/19/2013   CLINICAL DATA:  Preop for kyphoplasty.  EXAM: CHEST  2 VIEW  COMPARISON:  03/06/2009  FINDINGS: Two views of the chest demonstrate clear lungs except for a few densities at the left lung base. Left basilar densities could represent atelectasis. Retrocardiac lucency is suggestive for a hiatal hernia. No evidence for airspace disease or pleural fluid. Heart size is normal. Compression deformities involving T12 and L1.  IMPRESSION: Mild left basilar atelectasis.  Otherwise, no acute chest findings.  Hiatal hernia.  Compression deformities at T12 and L1.   Electronically Signed   By: Markus Daft M.D.   On: 09/19/2013 17:15   Dg Thoracolumabar Spine  09/21/2013   CLINICAL DATA:  T12 and L1 kyphoplasty.  EXAM: THORACOLUMBAR SPINE - 2 VIEW; DG C-ARM 61-120 MIN  COMPARISON:  None.  FINDINGS: We are provided with 2 fluoroscopic intraoperative spot views of the thoracolumbar junction. Images demonstrate compression fractures at T12 and L1 with methylmethacrylate within the vertebral bodies.  IMPRESSION: T12-L1 kyphoplasty.   Electronically Signed   By: Inge Rise M.D.   On: 09/21/2013 16:38   Dg Lumbar Spine 2-3 Views  09/19/2013   CLINICAL DATA:  Preop evaluation for kyphoplasty  EXAM: LUMBAR SPINE - 2-3 VIEW  COMPARISON:  None.  FINDINGS: Five non-rib-bearing lumbar vertebral bodies identified. A wedge compression fullness appreciated involving the L1 vertebral body appears bili magnitude approximately 25% vertebral body height loss. And weighs compression deformity is identified involving the T12 vertebral body with approximately 30% vertebral body height loss. The age of these findings are indeterminate. Mild disc space narrowing is appreciated at the L1-2 level. The disc spaces otherwise maintained. Atherosclerotic calcifications identified within the aorta.  IMPRESSION: Wedge compression deformities involving T12 and L1 as described above.   Electronically Signed   By: Margaree Mackintosh M.D.    On: 09/19/2013 14:14   Dg C-arm 61-120 Min  09/21/2013   CLINICAL DATA:  T12 and L1 kyphoplasty.  EXAM: THORACOLUMBAR SPINE - 2 VIEW; DG C-ARM 61-120 MIN  COMPARISON:  None.  FINDINGS: We are provided with 2 fluoroscopic intraoperative spot views of the thoracolumbar junction. Images demonstrate compression fractures at T12 and L1 with methylmethacrylate within the vertebral bodies.  IMPRESSION: T12-L1 kyphoplasty.   Electronically Signed   By: Inge Rise M.D.   On: 09/21/2013 16:38         Future Appointments Provider Department Dept Phone   09/26/2013 11:15 AM Kandis Nab, MD [place] [phone]      Follow-up Information   Schedule an appointment as soon as possible for a visit with Dahlia Bailiff, MD. (need return office visit 2 weeks postop)    Specialty:  Orthopedic Surgery   Contact information:   [address] [place] [address] [address] [phone]       Discharge Plan:  discharge to home  Disposition: stable    Signed: Lanae Crumbly for Dr. Melina Schools [facility] [phone] 09/22/2013, 11:08 AM

## 2013-09-22 NOTE — Evaluation (Signed)
Occupational Therapy Evaluation and Discharge Patient Details Name: Wendy [name] MRN: [id number] DOB: September 10, 1960 Today's Date: 09/22/2013 Time: 5188-4166 OT Time Calculation (min): 32 min  OT Assessment / Plan / Recommendation History of present illness T12 - L1 KYPHOPLASTY (Bilateral   Clinical Impression   This 53 yo female admitted and underwent above presents to acute OT with all education completed and per pt with also follow up with Sandrea Matte (her friend that lives with her) he will help her if needed. No further OT needs, will sign off.    OT Assessment  Patient does not need any further OT services    Follow Up Recommendations  No OT follow up       Equipment Recommendations  None recommended by OT          Precautions / Restrictions Precautions Precautions: Fall;Back Precaution Booklet Issued: Yes (comment) (to pt and friend that she lives with) Required Braces or Orthoses: Spinal Brace Spinal Brace: Lumbar corset;Applied in sitting position Restrictions Weight Bearing Restrictions: No       ADL  Eating/Feeding: Supervision/safety;Set up Where Assessed - Eating/Feeding: Chair Grooming: Supervision/safety;Set up Where Assessed - Grooming: Unsupported standing Upper Body Bathing: Set up;Supervision/safety Where Assessed - Upper Body Bathing: Unsupported sitting Lower Body Bathing: Moderate assistance Where Assessed - Lower Body Bathing: Unsupported sit to stand Upper Body Dressing: Set up;Supervision/safety Where Assessed - Upper Body Dressing: Unsupported sitting Lower Body Dressing: Maximal assistance Toilet Transfer: Supervision/safety Toilet Transfer Method: Sit to stand Toilet Transfer Equipment: Regular height toilet (the shower seat in the bathroom is the height of a standard toilet) Toileting - Clothing Manipulation and Hygiene: Supervision/safety Where Assessed - Toileting Clothing Manipulation and Hygiene: Sit to stand from 3-in-1 or  toilet Equipment Used: Gait belt;Rolling walker;Back brace Transfers/Ambulation Related to ADLs: S for all ADL Comments: Pt reports that at home she mainly only wears night shirt/gown and nothing else. If she does go out that her friend Sandrea Matte helps her get her LB dressed.        Acute Rehab OT Goals Patient Stated Goal: Home today  Visit Information  Last OT Received On: 09/22/13 Assistance Needed: +1 PT/OT/SLP Co-Evaluation/Treatment: Yes Reason for Co-Treatment: Necessary to address cognition/behavior during functional activity OT goals addressed during session: ADL's and self-care;Proper use of Adaptive equipment and DME History of Present Illness: T12 - L1 KYPHOPLASTY (Bilateral       Prior [place] expects to be discharged to:: Private residence Living Arrangements: Non-relatives/Friends Available Help at Discharge: Friend(s);Available 24 hours/day Type of Home: House Home Access: Level entry Home Layout: One level Home Equipment: Walker - 2 wheels;Cane - single point;Bedside commode;Shower seat (friend that lives with her confirms this) Prior Function Level of Independence: Needs assistance ADL's / Homemaking Assistance Needed: LBADLs Communication Communication: No difficulties Dominant Hand: Right         Vision/Perception Vision - History Patient Visual Report: No change from baseline   Cognition  Cognition Arousal/Alertness: Awake/alert Behavior During Therapy: WFL for tasks assessed/performed Overall Cognitive Status: History of cognitive impairments - at baseline (goes off on tangents at times when asking her questions) Memory: Decreased recall of precautions;Decreased short-term memory    Extremity/Trunk Assessment Upper Extremity Assessment Upper Extremity Assessment: Overall WFL for tasks assessed     Mobility Bed Mobility Overal bed mobility: Needs Assistance Bed Mobility: Rolling;Sidelying to Sit Rolling:  Supervision Sidelying to sit: Supervision General bed mobility comments: VCs for sequence Transfers Overall transfer level: Needs  assistance Equipment used: Rolling walker (2 wheeled) Transfers: Sit to/from Stand Sit to Stand: Supervision General transfer comment: for safety and safe hand placement           End of Session OT - End of Session Equipment Utilized During Treatment: Gait belt;Rolling walker;Back brace Activity Tolerance: Patient tolerated treatment well Patient left: in chair;with call bell/phone within reach Nurse Communication: Mobility status (need for HHPT safety eval)   Functional Assessment Tool Used: Clinical Observation Functional Limitation: Self care Self Care Current Status (W4132): At least 40 percent but less than 60 percent impaired, limited or restricted Self Care Goal Status (G4010): At least 40 percent but less than 60 percent impaired, limited or restricted Self Care Discharge Status [phone]): At least 40 percent but less than 60 percent impaired, limited or restricted   Almon Register [id number] 09/22/2013, 11:05 AM

## 2013-09-22 NOTE — Op Note (Signed)
Wendy [name], Wendy [name]            ACCOUNT NO.:  [id number]  MEDICAL RECORD NO.:  [id number]  LOCATION:  5N21C                        FACILITY:  [facility]  PHYSICIAN:  Dahlia Bailiff, MD    DATE OF BIRTH:  06/12/1961  DATE OF PROCEDURE:  09/21/2013 DATE OF DISCHARGE:                              OPERATIVE REPORT   PREOPERATIVE DIAGNOSIS:  Compression fracture, L1, T12.  POSTOPERATIVE DIAGNOSIS:  Compression fracture, L1, T12.  OPERATIVE PROCEDURE:  Kyphoplasty of thoracic 12 and lumbar 1.  COMPLICATIONS:  None.  CONDITION:  Stable.  HISTORY:  This is a very pleasant 53 year old woman who has been having progressive debilitating back pain.  Despite appropriate conservative management, her quality of life continued to deteriorate.  As a result, we elected to proceed with surgery.  All appropriate risks, benefits, and alternatives were discussed with the patient.  Consent was obtained.  OPERATIVE NOTE:  The patient was brought to the operating room, placed supine on the operating table.  After successful induction of general anesthesia and endotracheal intubation, the back was prepped and draped in usual standard fashion.  Once this was done, time-out was taken to confirm patient, procedure, and all other pertinent important data. Once this was completed, 2 x-rays were brought sterilely into the wound, 1 at the AP and the other into the lateral.  Identified by counting up from L5, the T12 and L1 vertebral bodies.  Once this was done, I infiltrated the starting position and made a small stab incision.  I advanced Jamshidi needle to the lateral aspect of the pedicle of T12.  I then went along the pedicle through extrapedicular approach.  I advanced it into the vertebral body.  I confirmed satisfactory position in both planes.  I repeated this on the contralateral side.  Once I had both sides cannulated, I then drilled, then placed the curette, and then placed the balloons.  I  inflated each balloon sequentially until I had a good amount.  I then mixed the cement appropriately.  Once it was at the appropriate viscosity, I deflated 1 balloon and placed the cementing on one side and then repeated on the contralateral side.  Once I had an adequate volume so that the cement was in the __________ and was in the medial space, I then stopped.  There was no significant posterolateral, superior, inferior leaks.  Once the cement was allowed to cure, I then removed the trocars and made 2 new stab incisions at the L1 level.  I repeated the procedure cannulating the L1 pedicle and then inflating and placing the cement. There was slight lateral leak on left thigh, but there was no posterior component.  Once it started to leak, I stopped inserting the cement.  Final x-rays once I removed the trocars were satisfactory, had good cement fill at the AP and the lateral planes at L1 and T12.  At this point, each of the incisions I made were closed with a simple Monocryl.  A dry dressing was applied.  The patient was then extubated, transferred to PACU without incident.  At the end of the case, all needle and sponge counts were correct.     Dahlia Bailiff, MD  DDB/MEDQ  D:  09/21/2013  T:  09/22/2013  Job:  [id number]

## 2013-09-23 NOTE — Discharge Summary (Signed)
Agree with above 

## 2013-09-25 DIAGNOSIS — F3289 Other specified depressive episodes: Secondary | ICD-10-CM | POA: Diagnosis not present

## 2013-09-25 DIAGNOSIS — K7460 Unspecified cirrhosis of liver: Secondary | ICD-10-CM | POA: Diagnosis not present

## 2013-09-25 DIAGNOSIS — K739 Chronic hepatitis, unspecified: Secondary | ICD-10-CM | POA: Diagnosis not present

## 2013-09-25 DIAGNOSIS — M545 Low back pain, unspecified: Secondary | ICD-10-CM | POA: Diagnosis not present

## 2013-09-25 DIAGNOSIS — R269 Unspecified abnormalities of gait and mobility: Secondary | ICD-10-CM | POA: Diagnosis not present

## 2013-09-25 DIAGNOSIS — IMO0002 Reserved for concepts with insufficient information to code with codable children: Secondary | ICD-10-CM | POA: Diagnosis not present

## 2013-09-25 DIAGNOSIS — F329 Major depressive disorder, single episode, unspecified: Secondary | ICD-10-CM | POA: Diagnosis not present

## 2013-09-25 NOTE — Progress Notes (Signed)
Late entry for missed G-code. Based on review of PT evaluation by Gerlean Ren, PT.  10/06/13 1430  PT G-Codes **NOT FOR INPATIENT CLASS**  Functional Assessment Tool Used clinical judgment based on chart review  Functional Limitation Mobility: Walking and moving around  Mobility: Walking and Moving Around Current Status (N8676) CI  Mobility: Walking and Moving Around Goal Status [phone]) CI  Mobility: Walking and Moving Around Discharge Status [phone]) CI  Lavonia Dana, [place]  [phone] 09/25/2013

## 2013-09-26 ENCOUNTER — Ambulatory Visit: Payer: Medicare Other | Admitting: Family Medicine

## 2013-09-28 DIAGNOSIS — M545 Low back pain, unspecified: Secondary | ICD-10-CM | POA: Diagnosis not present

## 2013-09-28 DIAGNOSIS — F329 Major depressive disorder, single episode, unspecified: Secondary | ICD-10-CM | POA: Diagnosis not present

## 2013-09-28 DIAGNOSIS — F3289 Other specified depressive episodes: Secondary | ICD-10-CM | POA: Diagnosis not present

## 2013-09-28 DIAGNOSIS — IMO0002 Reserved for concepts with insufficient information to code with codable children: Secondary | ICD-10-CM | POA: Diagnosis not present

## 2013-09-28 DIAGNOSIS — R269 Unspecified abnormalities of gait and mobility: Secondary | ICD-10-CM | POA: Diagnosis not present

## 2013-09-28 DIAGNOSIS — K7460 Unspecified cirrhosis of liver: Secondary | ICD-10-CM | POA: Diagnosis not present

## 2013-09-28 DIAGNOSIS — K739 Chronic hepatitis, unspecified: Secondary | ICD-10-CM | POA: Diagnosis not present

## 2013-10-02 ENCOUNTER — Ambulatory Visit (INDEPENDENT_AMBULATORY_CARE_PROVIDER_SITE_OTHER): Payer: Medicare Other | Admitting: Family Medicine

## 2013-10-02 ENCOUNTER — Encounter: Payer: Self-pay | Admitting: Family Medicine

## 2013-10-02 VITALS — BP 129/83 | HR 90 | Temp 98.2°F | Ht 62.0 in | Wt 205.0 lb

## 2013-10-02 DIAGNOSIS — F4001 Agoraphobia with panic disorder: Secondary | ICD-10-CM | POA: Diagnosis not present

## 2013-10-02 DIAGNOSIS — H02409 Unspecified ptosis of unspecified eyelid: Secondary | ICD-10-CM | POA: Diagnosis not present

## 2013-10-02 DIAGNOSIS — M549 Dorsalgia, unspecified: Secondary | ICD-10-CM | POA: Diagnosis not present

## 2013-10-02 MED ORDER — CLONAZEPAM 1 MG PO TABS: 1.0000 mg | ORAL_TABLET | Freq: Three times a day (TID) | ORAL | Status: DC | PRN

## 2013-10-02 MED ORDER — ALENDRONATE SODIUM 70 MG PO TABS: 70.0000 mg | ORAL_TABLET | ORAL | Status: DC

## 2013-10-02 MED ORDER — OXYCODONE HCL 10 MG PO TABS: 10.0000 mg | ORAL_TABLET | Freq: Three times a day (TID) | ORAL | Status: DC | PRN

## 2013-10-02 NOTE — Patient Instructions (Signed)
Follow up with me in 1 month. Bring back a log of when you take the clonazepam.   For the constipation, increase the lactulose to three times a day. If that doesn't work, use the miralax.

## 2013-10-03 NOTE — Progress Notes (Signed)
Patient ID: Wendy [name]    DOB: August 01, 1961, 53 y.o.   MRN: [id number] --- Subjective:  Wendy [name] is a 53 y.o.female who presents for medication refill.  - patient had kyphoplasty of T12 and L1 on 09/21/13. Has been seen by home PT. Continues to have pain. Using oxycodone 10mg  tid. She denies any new lower extremity weakness or falls.  Has had some constipation and has 1-2 hard bowel movements per day. Has taken lactulose once a day and colace.  - recently saw her hepatologist, Dr. Monica Martinez who recommends every 51months, increase coreg and possibly initiate hepatitis C therapy.   - anxiety: continues to struggle with anxiety. Takes clonazepam 1mg  between once and three times a day. She has 5 left from her previous prescription written on 08/31/13. Denies any SI/HI.   ROS: patient otherwise denies any chest pain, shortness of breath, increased lower extremity swelling or weight gain.   ROS: see HPI Past Medical History: reviewed and updated medications and allergies. Social History: Tobacco:  Objective: Filed Vitals:   10/02/13 1526  BP: 129/83  Pulse: 90  Temp: 98.2 F (36.8 C)    Physical Examination:   General appearance - alert, well appearing, and in no distress, normal speech and affect.  Chest - clear to auscultation, no wheezes, rales or rhonchi, symmetric air entry Heart - normal rate, regular rhythm, normal S1, S2, no murmurs Abdomen - soft, distended, non tender Back - surgical site healing well with sutures intact. No surrounding erythema or warmth.  Tenderness along lumbar spine.  4+/5 strength with knee flexion, extension and foot dorsiflexion and plantarflexion bilaterally.

## 2013-10-04 DIAGNOSIS — F329 Major depressive disorder, single episode, unspecified: Secondary | ICD-10-CM | POA: Diagnosis not present

## 2013-10-04 DIAGNOSIS — F3289 Other specified depressive episodes: Secondary | ICD-10-CM | POA: Diagnosis not present

## 2013-10-04 DIAGNOSIS — IMO0002 Reserved for concepts with insufficient information to code with codable children: Secondary | ICD-10-CM | POA: Diagnosis not present

## 2013-10-04 DIAGNOSIS — K7460 Unspecified cirrhosis of liver: Secondary | ICD-10-CM | POA: Diagnosis not present

## 2013-10-04 DIAGNOSIS — R269 Unspecified abnormalities of gait and mobility: Secondary | ICD-10-CM | POA: Diagnosis not present

## 2013-10-04 DIAGNOSIS — K739 Chronic hepatitis, unspecified: Secondary | ICD-10-CM | POA: Diagnosis not present

## 2013-10-04 DIAGNOSIS — M545 Low back pain, unspecified: Secondary | ICD-10-CM | POA: Diagnosis not present

## 2013-10-04 NOTE — Assessment & Plan Note (Addendum)
Patient gave me Rx from Dr. Rolena Infante for oxycodone which she did not fill for me to shred, in order for her to honor her pain contract.  Refilled oxycodone.  Recommended increased lactulose to avoid constipation and to avoid worsening encephalopathy.  Surgical site appears dry, intact and healing well. Patient to follow up with Dr. Rolena Infante in 6 weeks.

## 2013-10-04 NOTE — Assessment & Plan Note (Addendum)
Reviewed Dr. Hanley Seamen recommendations to avoid clonazepam in patient with hepatic encephalopathy. Discussed this with patient and she feels like she is not ready to do this given recent stressors of back surgery. Urged her to make appointment with our psychologist, Dr. Gwenlyn Saran, so that she can find alternative solutions to cope with her fears and anxiety.  - patient will record the number of tablets she takes in a day and we will try to wean her according to this at next visit.

## 2013-10-05 ENCOUNTER — Telehealth: Payer: Self-pay | Admitting: Family Medicine

## 2013-10-05 NOTE — Telephone Encounter (Signed)
Will fwd to MD for advice.  Leandro Berkowitz L, CMA  

## 2013-10-05 NOTE — Telephone Encounter (Signed)
Having stomach cramps. No vomiting diarrhea. Wants to have something to take OTC> Please advise

## 2013-10-06 NOTE — Telephone Encounter (Signed)
Called patient back but no answer. Left message on voicemail with instructions that over the counter medicine like ibuprofen for stomach cramping are not particularly safe in her case (cirrhosis). Warm pad on abdomen. If pain continues to persist, recommended her to be evaluated in clinic. Recommended patient to call us back if she has any questions.   Liam Graham, PGY-3 Family Medicine Resident

## 2013-10-09 ENCOUNTER — Telehealth: Payer: Self-pay | Admitting: Psychology

## 2013-10-09 NOTE — Telephone Encounter (Signed)
Kim left a VM to schedule a beh med appointment.  I called her back and left a VM.

## 2013-10-11 ENCOUNTER — Telehealth: Payer: Self-pay | Admitting: Family Medicine

## 2013-10-11 NOTE — Telephone Encounter (Signed)
Pt states [employer] left her a message regarding her referral to opthamologist.  Will fwd message to [employer] that pt called back.  Pervis Macintyre, Loralyn Freshwater, [place]

## 2013-10-11 NOTE — Telephone Encounter (Signed)
Pt called and said that someone from our office saying that she need to set an appointment to have a eye exam. I didn't see any notes stating that. jw

## 2013-10-16 ENCOUNTER — Telehealth: Payer: Self-pay | Admitting: Family Medicine

## 2013-10-16 NOTE — Telephone Encounter (Signed)
Will fwd to MD.  Tannis Burstein L, CMA  

## 2013-10-16 NOTE — Telephone Encounter (Signed)
I prescribed a 1 month supply of oxycodone 10mg  tid/prn, 90 tablets on 10/02/13. I will not be able to refill the medication prior to 10/30/13. She needs an office visit for pain medication refills. If she is in so much pain that she took all of her medications and has run out, she will need to be re-evaluated.  Please let patient know.  Thank you!  Liam Graham, PGY-3 Family Medicine Resident

## 2013-10-16 NOTE — Telephone Encounter (Signed)
Pt called and would like a refill on her Oxycodone left up front. Jw

## 2013-10-16 NOTE — Telephone Encounter (Signed)
Left message that patient needs to schedule an office visit.  Ladeidra Borys, Loralyn Freshwater, [place]

## 2013-10-16 NOTE — Telephone Encounter (Signed)
Called pt. LMVM  To call back. Please see message. Thanks. Javier Glazier, Gerrit Heck

## 2013-10-18 ENCOUNTER — Telehealth: Payer: Self-pay | Admitting: Family Medicine

## 2013-10-18 ENCOUNTER — Telehealth: Payer: Self-pay | Admitting: Psychology

## 2013-10-18 NOTE — Telephone Encounter (Signed)
Kim left three VMs while I was on vacation.  I called her back today at 4:14 and she did not come to the phone because she was sleeping.  I left my name and phone number with the person that answered the phone and asked that she call me.

## 2013-10-18 NOTE — Telephone Encounter (Signed)
Called pt and her friend answered phone and stated patient had found her lost pain meds Rx.  She does not need anything.  Lalo Tromp, Loralyn Freshwater, [place]

## 2013-10-18 NOTE — Telephone Encounter (Signed)
Pt called and would like Dr. Otis Dials to call her to straighten out some confusion. She feels that you has mis-understood what she wanted. jw

## 2013-10-20 DIAGNOSIS — Z9889 Other specified postprocedural states: Secondary | ICD-10-CM | POA: Diagnosis not present

## 2013-10-20 DIAGNOSIS — S32009A Unspecified fracture of unspecified lumbar vertebra, initial encounter for closed fracture: Secondary | ICD-10-CM | POA: Diagnosis not present

## 2013-10-26 DIAGNOSIS — M545 Low back pain, unspecified: Secondary | ICD-10-CM | POA: Diagnosis not present

## 2013-10-26 DIAGNOSIS — K739 Chronic hepatitis, unspecified: Secondary | ICD-10-CM | POA: Diagnosis not present

## 2013-10-26 DIAGNOSIS — F3289 Other specified depressive episodes: Secondary | ICD-10-CM | POA: Diagnosis not present

## 2013-10-26 DIAGNOSIS — IMO0002 Reserved for concepts with insufficient information to code with codable children: Secondary | ICD-10-CM | POA: Diagnosis not present

## 2013-10-26 DIAGNOSIS — K7460 Unspecified cirrhosis of liver: Secondary | ICD-10-CM | POA: Diagnosis not present

## 2013-10-26 DIAGNOSIS — R269 Unspecified abnormalities of gait and mobility: Secondary | ICD-10-CM | POA: Diagnosis not present

## 2013-10-26 DIAGNOSIS — F329 Major depressive disorder, single episode, unspecified: Secondary | ICD-10-CM | POA: Diagnosis not present

## 2013-10-30 ENCOUNTER — Ambulatory Visit: Payer: Medicare Other | Admitting: Family Medicine

## 2013-10-30 ENCOUNTER — Telehealth: Payer: Self-pay | Admitting: Family Medicine

## 2013-10-30 MED ORDER — AMOXICILLIN 500 MG PO CAPS
ORAL_CAPSULE | ORAL | Status: DC
Start: 2013-10-30 — End: 2014-01-02

## 2013-10-30 NOTE — Telephone Encounter (Signed)
Will fwd. To PCP for review .Seaton Hofmann  

## 2013-10-30 NOTE — Telephone Encounter (Signed)
Refilled amoxicillin and sent it to the pharmacy.  Please let patient know. Thank you!  Liam Graham, PGY-3 Family Medicine Resident

## 2013-10-30 NOTE — Telephone Encounter (Signed)
Called pt. Informed. .Wendy [name]  

## 2013-10-30 NOTE — Telephone Encounter (Signed)
Pt called and needs a refill on her amoxicillin so that she can go to the dentist. Wendy [name]

## 2013-10-31 ENCOUNTER — Telehealth: Payer: Self-pay | Admitting: Psychology

## 2013-10-31 NOTE — Telephone Encounter (Signed)
Kim called and left a VM again.  We have been exchanging VMs.  She stated on hers that she has an appointment with Dr. Otis Dials on the 17th at 2:45.  I scheduled her for 3:00 and will see her when she is finished with Dr. Otis Dials.

## 2013-11-01 ENCOUNTER — Telehealth: Payer: Self-pay | Admitting: Family Medicine

## 2013-11-01 DIAGNOSIS — H538 Other visual disturbances: Secondary | ICD-10-CM | POA: Diagnosis not present

## 2013-11-01 DIAGNOSIS — H02429 Myogenic ptosis of unspecified eyelid: Secondary | ICD-10-CM | POA: Diagnosis not present

## 2013-11-01 NOTE — Telephone Encounter (Signed)
Will fwd to Dr. Otis Dials for advice.  I will call patient with suggestions.  Gionni Freese, Loralyn Freshwater, [place]

## 2013-11-01 NOTE — Telephone Encounter (Signed)
Called patient and left message stating that if she has not had a bowel movement in 5 days, she needs to be seen in clinic tomorrow morning. In the setting of her chronic liver disease, I am concerned about encephalopathy.   If patient calls back, please make an appointment for her to be seen. Also let her know that she needs to take the lactulose 4 times a day until she has a soft bowel movement per day.   Liam Graham, PGY-3 Family Medicine Resident

## 2013-11-01 NOTE — Telephone Encounter (Signed)
Pt called and would like some advice on what to. She has not had a bowel movement in 5 days. She said that she was given two types of medication to help her but the medication is not helping. She would like someone to call her to advise. jw

## 2013-11-02 MED ORDER — LACTULOSE 10 GM/15ML PO SOLN: 10.0000 g | Freq: Two times a day (BID) | ORAL | Status: DC | PRN

## 2013-11-02 NOTE — Telephone Encounter (Signed)
Refilled lactulose and sent to [facility] on [address].   Liam Graham, PGY-3 Family Medicine Resident

## 2013-11-02 NOTE — Telephone Encounter (Signed)
Fwd to PCP for refill. Thanks. .Wendy [name]  

## 2013-11-02 NOTE — Addendum Note (Signed)
Addended by: Kandis Nab on: 11/02/2013 11:53 AM   Modules accepted: Orders

## 2013-11-02 NOTE — Telephone Encounter (Signed)
She has an appt tomorrow but she doesn't have enough lactulose to take 4 times a day

## 2013-11-03 ENCOUNTER — Ambulatory Visit: Payer: Medicare Other | Admitting: Emergency Medicine

## 2013-11-03 NOTE — Telephone Encounter (Signed)
LMVM that pt should just continue doing what she is doing, intake plenty of water/fluid, and to increase her fiber.  Xian Apostol, Loralyn Freshwater, [place]

## 2013-11-03 NOTE — Telephone Encounter (Signed)
She cancelled her appt for today because she had a movement yesterday. Wants to know if there is something else she should be doing to help today Please advise

## 2013-11-07 ENCOUNTER — Telehealth: Payer: Self-pay | Admitting: Psychology

## 2013-11-07 ENCOUNTER — Ambulatory Visit: Payer: Medicare Other | Admitting: Psychology

## 2013-11-07 ENCOUNTER — Ambulatory Visit: Payer: Medicare Other | Admitting: Family Medicine

## 2013-11-07 ENCOUNTER — Other Ambulatory Visit: Payer: Self-pay | Admitting: Family Medicine

## 2013-11-07 NOTE — Telephone Encounter (Signed)
Wendy [name] called at 2:28 to cancel her 3:00 appointment secondary to "an accident of sorts."  She said she would call back tomorrow to discuss rescheduling.

## 2013-11-08 NOTE — Telephone Encounter (Signed)
Is she homebound and not able to physically (or psych related) make it to counseling appointments? Trying to see if she would even qualify for home services.Marland KitchenMarland Kitchen

## 2013-11-08 NOTE — Telephone Encounter (Signed)
Patient left a VM requesting an appointment the same day she sees Dr. Otis Dials (3/25).  I don't have anything that day.  I left her a VM and asked her to call me back to discuss.  I have 25 phone notes with her and not one visit.  They are usually day of cancellations.  Will need to discuss feasibility of her coming to see me.  Case management / in home counseling might be a more appropriate option.

## 2013-11-13 ENCOUNTER — Telehealth: Payer: Self-pay | Admitting: Psychology

## 2013-11-13 NOTE — Telephone Encounter (Signed)
She would definitely benefit from [place] for her chronic liver disease which she has trouble managing on her own. She has had difficulty with encephalopathy at times and often misses her appointments. I think [facility] involvement would be very helpful. Let me know what I need to do to make this happen. Thank you!  Liam Graham, PGY-3 Family Medicine Resident

## 2013-11-13 NOTE — Telephone Encounter (Signed)
CSW placed referral with [facility], RN- Erenest Rasher. CSW to await an update regarding whether pt qualifies for RN/CSW services.  Hunt Oris, MSW, [place]

## 2013-11-13 NOTE — Telephone Encounter (Signed)
I just spoke with [facility] and was told she would not qualify based on the information provided. They see individuals with severe mood disorders, and psychotic features/on the schizophrenia spectrum disorder. They currently have 20 referrals and her case does not appear severe... I'm wondering if a [facility] referral might be helpful. I'm wondering if she has anything medically that they could assist with & then they could also have a Education officer, museum come out... However, the social worker would work on goals/getting her to her appts etc vs long term treatment at home.Thoughts?

## 2013-11-13 NOTE — Telephone Encounter (Signed)
Kim left a VM over the weekend trying to connect again for an appointment.  I contacted our social worker to determine whether in-house services might be available given her difficulty attending appointments.  I am not sure how good of a candidate she is for traditional psychotherapy.  I called her back and left a VM asking her to call me to discuss options.

## 2013-11-13 NOTE — Telephone Encounter (Signed)
Wendy [name] called back and we talked.  She is open to having a counselor (Education officer, museum) visit her at home.  I told her I would let her know when I hear something back from our in-house social worker.

## 2013-11-14 ENCOUNTER — Telehealth: Payer: Self-pay | Admitting: Clinical

## 2013-11-14 NOTE — Telephone Encounter (Signed)
Called patient to discuss information below that I received from Hunt Oris, LCSW to close the loop from my end.  She is not familiar with [facility], does not think a Education officer, museum has been working with her, did not know the name [facility] and stated that she is not applying for Section 8 housing.  She said the only person that has worked with her since her last hospitalization was someone to show her some rehab exercises.    I am not sure where the disconnect is (perhaps I don't understand where these services are delivered).  Will consult with Normal Wilson again.

## 2013-11-14 NOTE — Telephone Encounter (Signed)
I contacted Erenest Rasher, [facility]- RN to confirm the information provided was indeed correct (receiving assistance for EBT/section 8). She apologized as the information was not regarding this patient. Erenest Rasher will review referral and follow up to determine whether pt qualifies for services.  Hunt Oris, MSW, [place]

## 2013-11-14 NOTE — Telephone Encounter (Signed)
Clinical Education officer, museum (CSW) informed that pt is currently receiving services through [facility]. CSW also made aware that pt is receiving assistance filling out an EBT application and Section 8 housing. The pt was also assessed for an In Home Aide service but was denied, currently pt is waiting for the certified letter to appeal. CSW also informed that pt's agoraphobia has made it difficult for her to make her appointments with her psychiatrist however a [facility] Social Worker is working with pt to decrease symptoms.  Hunt Oris, MSW, [place]

## 2013-11-15 ENCOUNTER — Ambulatory Visit (INDEPENDENT_AMBULATORY_CARE_PROVIDER_SITE_OTHER): Payer: Medicare Other | Admitting: Family Medicine

## 2013-11-15 ENCOUNTER — Encounter: Payer: Self-pay | Admitting: Family Medicine

## 2013-11-15 VITALS — BP 116/78 | HR 86 | Temp 97.8°F | Ht 62.0 in | Wt 204.0 lb

## 2013-11-15 DIAGNOSIS — K5900 Constipation, unspecified: Secondary | ICD-10-CM

## 2013-11-15 DIAGNOSIS — M549 Dorsalgia, unspecified: Secondary | ICD-10-CM

## 2013-11-15 DIAGNOSIS — F4001 Agoraphobia with panic disorder: Secondary | ICD-10-CM | POA: Diagnosis not present

## 2013-11-15 MED ORDER — LACTULOSE 10 GM/15ML PO SOLN: 30.0000 g | Freq: Four times a day (QID) | ORAL | Status: DC | PRN

## 2013-11-15 MED ORDER — POLYETHYLENE GLYCOL 3350 17 GM/SCOOP PO POWD
17.0000 g | Freq: Once | ORAL | Status: DC
Start: 2013-11-15 — End: 2014-01-10

## 2013-11-15 MED ORDER — CLONAZEPAM 1 MG PO TABS: 1.0000 mg | ORAL_TABLET | Freq: Three times a day (TID) | ORAL | Status: DC | PRN

## 2013-11-15 MED ORDER — OXYCODONE HCL 10 MG PO TABS: 10.0000 mg | ORAL_TABLET | Freq: Three times a day (TID) | ORAL | Status: DC | PRN

## 2013-11-15 NOTE — Patient Instructions (Signed)
Take the lactulose 4 times a day. I have increased the dose of lactulose. If you do not have a soft bowel movement daily after one to 2 days of the lactulose start taking the MiraLAX. Followup with me in one month

## 2013-11-18 DIAGNOSIS — K5900 Constipation, unspecified: Secondary | ICD-10-CM | POA: Insufficient documentation

## 2013-11-18 NOTE — Assessment & Plan Note (Signed)
Keeping her from making her appointments on a regular basis. Setting up home visit to see if she would benefit from home care.

## 2013-11-18 NOTE — Assessment & Plan Note (Signed)
Refilled oxycodone.

## 2013-11-18 NOTE — Progress Notes (Signed)
Patient ID: Wendy [name]    DOB: 1960/11/02, 53 y.o.   MRN: [id number] --- Subjective:  Georgian is a 53 y.o.female who presents for pain medication refill and for follow up on constipation.  - pain medication for chronic low back and right hip pain: patient underwent recent kyphoplasty of T12-L1. SHe continues to have pain associated with it. Worst with movement, better with rest. Pain medicine helps. No new lower extremity weakness.  - constipation: bowel movement every other day. started a few weeks ago. Has been taking lactulose 10g 4 times a day. She has not been taking miralax. No blood in the stool. No abdominal pain. No nausea, no vomiting.  - agoraphobia: difficulty with getting out of the house for appointments. Reports that she is often ready to go out, but at the last minute, once at the door, feels overwhelmed and cannot exit.   ROS: see HPI Past Medical History: reviewed and updated medications and allergies. Social History: Tobacco: some day smoker  Objective: Filed Vitals:   11/15/13 1456  BP: 116/78  Pulse: 86  Temp: 97.8 F (36.6 C)    Physical Examination:   General appearance - alert, well appearing, and in no distress  Chest - clear to auscultation, no wheezes, rales or rhonchi, symmetric air entry Back - tenderness to palpation along right paralumbar muscle and lumbar spine. 4+/5 srength in lower extremities bilaterally Abdomen - nontender, mildly distended but soft, no masses appreciated Neuro - speech fluent, normal thought process, no asterixis, CN2-12 grossly intact

## 2013-11-18 NOTE — Assessment & Plan Note (Signed)
Suspect pain medicine is contributing to this. No evidence of encephalopathy on exam.  Increase lactulose dose to 30 g 4 times a day, until 2-3 soft bowel movements daily.

## 2013-11-22 ENCOUNTER — Other Ambulatory Visit: Payer: Self-pay | Admitting: Internal Medicine

## 2013-11-22 NOTE — Telephone Encounter (Signed)
Refill xifaxan for 1 year  Ask her if she has spoken to anyone about hepatitis C treatment (new treatments out now)

## 2013-11-22 NOTE — Telephone Encounter (Signed)
Refill sent in Sir and Patient informed me that Dr. Claybon Jabs had spoken to her about Hep C treatments.  She is going to call them back and see what the next steps are.

## 2013-11-22 NOTE — Telephone Encounter (Signed)
Please advise if ok Sir, thank you. 

## 2013-11-27 DIAGNOSIS — S32009A Unspecified fracture of unspecified lumbar vertebra, initial encounter for closed fracture: Secondary | ICD-10-CM | POA: Diagnosis not present

## 2013-11-30 ENCOUNTER — Other Ambulatory Visit: Payer: Self-pay | Admitting: Family Medicine

## 2013-11-30 ENCOUNTER — Other Ambulatory Visit: Payer: Self-pay | Admitting: *Deleted

## 2013-11-30 DIAGNOSIS — I10 Essential (primary) hypertension: Secondary | ICD-10-CM

## 2013-11-30 MED ORDER — CARVEDILOL 12.5 MG PO TABS: 12.5000 mg | ORAL_TABLET | Freq: Two times a day (BID) | ORAL | Status: DC

## 2013-12-05 DIAGNOSIS — M438X9 Other specified deforming dorsopathies, site unspecified: Secondary | ICD-10-CM | POA: Diagnosis not present

## 2013-12-08 ENCOUNTER — Telehealth: Payer: Self-pay | Admitting: Family Medicine

## 2013-12-08 NOTE — Telephone Encounter (Signed)
Pt calling to ask if she can get something for pain from a tooth that's bothering her.  Please advise

## 2013-12-08 NOTE — Telephone Encounter (Signed)
Will fwd to Md for review.  Khristine Verno L Tomorrow Dehaas, CMA  

## 2013-12-08 NOTE — Telephone Encounter (Signed)
Please let patient know that she needs to contact her dentist about this. Thank you!  Liam Graham, PGY-3 Family Medicine Resident

## 2013-12-11 NOTE — Telephone Encounter (Signed)
Pt notified and is agreeable.  Javien Tesch L Rufus Beske, CMA  

## 2013-12-24 ENCOUNTER — Other Ambulatory Visit: Payer: Self-pay | Admitting: Family Medicine

## 2013-12-27 ENCOUNTER — Other Ambulatory Visit: Payer: Self-pay | Admitting: *Deleted

## 2013-12-27 MED ORDER — CYCLOBENZAPRINE HCL 10 MG PO TABS: 10.0000 mg | ORAL_TABLET | Freq: Three times a day (TID) | ORAL | Status: DC | PRN

## 2014-01-02 ENCOUNTER — Other Ambulatory Visit: Payer: Self-pay | Admitting: Family Medicine

## 2014-01-05 NOTE — Telephone Encounter (Signed)
Reason for Call     Advice Only                Call Documentation     Irene Limbo, RN at 08/30/2012 2:53 PM     Status: Signed        Patient was sleeping when I called back. He stated that he just wanted to know what bland foods were. I explained what a bland diet was to him with good feedback.

## 2014-01-10 ENCOUNTER — Ambulatory Visit (INDEPENDENT_AMBULATORY_CARE_PROVIDER_SITE_OTHER): Payer: Medicare Other | Admitting: Family Medicine

## 2014-01-10 ENCOUNTER — Emergency Department (HOSPITAL_COMMUNITY)
Admission: EM | Admit: 2014-01-10 | Discharge: 2014-01-10 | Disposition: A | Discharge status: Home / Self Care | Payer: Medicare Other | Attending: Emergency Medicine | Admitting: Emergency Medicine

## 2014-01-10 ENCOUNTER — Other Ambulatory Visit: Payer: Self-pay

## 2014-01-10 ENCOUNTER — Encounter (HOSPITAL_COMMUNITY): Payer: Self-pay | Admitting: Emergency Medicine

## 2014-01-10 DIAGNOSIS — F17200 Nicotine dependence, unspecified, uncomplicated: Secondary | ICD-10-CM | POA: Insufficient documentation

## 2014-01-10 DIAGNOSIS — I10 Essential (primary) hypertension: Secondary | ICD-10-CM | POA: Diagnosis not present

## 2014-01-10 DIAGNOSIS — M129 Arthropathy, unspecified: Secondary | ICD-10-CM | POA: Diagnosis not present

## 2014-01-10 DIAGNOSIS — Z8781 Personal history of (healed) traumatic fracture: Secondary | ICD-10-CM | POA: Insufficient documentation

## 2014-01-10 DIAGNOSIS — K7460 Unspecified cirrhosis of liver: Secondary | ICD-10-CM

## 2014-01-10 DIAGNOSIS — R55 Syncope and collapse: Secondary | ICD-10-CM

## 2014-01-10 DIAGNOSIS — Z5189 Encounter for other specified aftercare: Secondary | ICD-10-CM | POA: Diagnosis not present

## 2014-01-10 DIAGNOSIS — Z872 Personal history of diseases of the skin and subcutaneous tissue: Secondary | ICD-10-CM | POA: Diagnosis not present

## 2014-01-10 DIAGNOSIS — F1021 Alcohol dependence, in remission: Secondary | ICD-10-CM | POA: Insufficient documentation

## 2014-01-10 DIAGNOSIS — F4001 Agoraphobia with panic disorder: Secondary | ICD-10-CM | POA: Insufficient documentation

## 2014-01-10 DIAGNOSIS — F329 Major depressive disorder, single episode, unspecified: Secondary | ICD-10-CM | POA: Diagnosis not present

## 2014-01-10 DIAGNOSIS — F3289 Other specified depressive episodes: Secondary | ICD-10-CM | POA: Insufficient documentation

## 2014-01-10 DIAGNOSIS — Z792 Long term (current) use of antibiotics: Secondary | ICD-10-CM | POA: Diagnosis not present

## 2014-01-10 DIAGNOSIS — Z79899 Other long term (current) drug therapy: Secondary | ICD-10-CM | POA: Insufficient documentation

## 2014-01-10 DIAGNOSIS — M810 Age-related osteoporosis without current pathological fracture: Secondary | ICD-10-CM | POA: Insufficient documentation

## 2014-01-10 DIAGNOSIS — Z862 Personal history of diseases of the blood and blood-forming organs and certain disorders involving the immune mechanism: Secondary | ICD-10-CM | POA: Insufficient documentation

## 2014-01-10 DIAGNOSIS — F411 Generalized anxiety disorder: Secondary | ICD-10-CM | POA: Insufficient documentation

## 2014-01-10 DIAGNOSIS — I951 Orthostatic hypotension: Secondary | ICD-10-CM | POA: Insufficient documentation

## 2014-01-10 DIAGNOSIS — Z9849 Cataract extraction status, unspecified eye: Secondary | ICD-10-CM | POA: Diagnosis not present

## 2014-01-10 LAB — CBC WITH DIFFERENTIAL/PLATELET
BASOS PCT: 0 % (ref 0–1)
Basophils Absolute: 0 10*3/uL (ref 0.0–0.1)
EOS PCT: 2 % (ref 0–5)
Eosinophils Absolute: 0.2 10*3/uL (ref 0.0–0.7)
HCT: 37.4 % (ref 36.0–46.0)
Hemoglobin: 13.2 g/dL (ref 12.0–15.0)
LYMPHS PCT: 27 % (ref 12–46)
Lymphs Abs: 2.8 10*3/uL (ref 0.7–4.0)
MCH: 39.4 pg — AB (ref 26.0–34.0)
MCHC: 35.3 g/dL (ref 30.0–36.0)
MCV: 111.6 fL — ABNORMAL HIGH (ref 78.0–100.0)
MONO ABS: 1.9 10*3/uL — AB (ref 0.1–1.0)
Monocytes Relative: 18 % — ABNORMAL HIGH (ref 3–12)
NEUTROS PCT: 53 % (ref 43–77)
Neutro Abs: 5.4 10*3/uL (ref 1.7–7.7)
PLATELETS: 123 10*3/uL — AB (ref 150–400)
RBC: 3.35 MIL/uL — AB (ref 3.87–5.11)
RDW: 14 % (ref 11.5–15.5)
WBC: 10.3 10*3/uL (ref 4.0–10.5)

## 2014-01-10 LAB — URINALYSIS, ROUTINE W REFLEX MICROSCOPIC
BILIRUBIN URINE: NEGATIVE
GLUCOSE, UA: NEGATIVE mg/dL
HGB URINE DIPSTICK: NEGATIVE
Ketones, ur: NEGATIVE mg/dL
Nitrite: NEGATIVE
Protein, ur: NEGATIVE mg/dL
Specific Gravity, Urine: 1.013 (ref 1.005–1.030)
Urobilinogen, UA: 1 mg/dL (ref 0.0–1.0)
pH: 6.5 (ref 5.0–8.0)

## 2014-01-10 LAB — COMPREHENSIVE METABOLIC PANEL
ALBUMIN: 2.7 g/dL — AB (ref 3.5–5.2)
ALT: 49 U/L — AB (ref 0–35)
AST: 70 U/L — AB (ref 0–37)
Alkaline Phosphatase: 154 U/L — ABNORMAL HIGH (ref 39–117)
BILIRUBIN TOTAL: 1.2 mg/dL (ref 0.3–1.2)
BUN: 20 mg/dL (ref 6–23)
CO2: 22 meq/L (ref 19–32)
CREATININE: 1.1 mg/dL (ref 0.50–1.10)
Calcium: 9.5 mg/dL (ref 8.4–10.5)
Chloride: 100 mEq/L (ref 96–112)
GFR calc Af Amer: 65 mL/min — ABNORMAL LOW (ref >90)
GFR calc non Af Amer: 56 mL/min — ABNORMAL LOW (ref >90)
Glucose, Bld: 90 mg/dL (ref 70–99)
Potassium: 4.3 mEq/L (ref 3.7–5.3)
SODIUM: 136 meq/L — AB (ref 137–147)
Total Protein: 7.8 g/dL (ref 6.0–8.3)

## 2014-01-10 LAB — URINE MICROSCOPIC-ADD ON

## 2014-01-10 LAB — POCT HEMOGLOBIN: HEMOGLOBIN: 11.8 g/dL — AB (ref 12.2–16.2)

## 2014-01-10 MED ORDER — LACTULOSE 10 GM/15ML PO SOLN: 30.0000 g | Freq: Four times a day (QID) | ORAL | Status: DC | PRN

## 2014-01-10 MED ORDER — SODIUM CHLORIDE 0.9 % IV BOLUS (SEPSIS)
1000.0000 mL | Freq: Once | INTRAVENOUS | Status: AC
  Administered 2014-01-10: 1000 mL via INTRAVENOUS

## 2014-01-10 NOTE — ED Notes (Signed)
States has been passing out  For no reason x 1 week  Saw dr today and states that they sent her over for further test

## 2014-01-10 NOTE — Discharge Instructions (Signed)
Keep your self well hydrated. Do not take your Lasix for the next 48 hours. Follow up with your primary care physician if you're not improving.  Cirrhosis Cirrhosis is a condition of scarring of the liver which is caused when the liver has tried repairing itself following damage. This damage may come from a previous infection such as one of the forms of hepatitis (usually hepatitis C), or the damage may come from being injured by toxins. The main toxin that causes this damage is alcohol. The scarring of the liver from use of alcohol is irreversible. That means the liver cannot return to normal even though alcohol is not used any more. The main danger of hepatitis C infection is that it may cause long-lasting (chronic) liver disease, and this also may lead to cirrhosis. This complication is progressive and irreversible. CAUSES  Prior to available blood tests, hepatitis C could be contracted by blood transfusions. Since testing of blood has improved, this is now unlikely. This infection can also be contracted through intravenous drug use and the sharing of needles. It can also be contracted through sexual relationships. The injury caused by alcohol comes from too much use. It is not a few drinks that poison the liver, but years of misuse. Usually there will be some signs and symptoms early with scarring of the liver that suggest the development of better habits. Alcohol should never be used while using acetaminophen. A small dose of both taken together may cause irreversible damage to the liver. HOME CARE INSTRUCTIONS  There is no specific treatment for cirrhosis. However, there are things you can do to avoid making the condition worse.  Rest as needed.  Eat a well-balanced diet. Your caregiver can help you with suggestions.  Vitamin supplements including vitamins A, K, D, and thiamine can help.  A low-salt diet, water restriction, or diuretic medicine may be needed to reduce fluid retention.  Avoid  alcohol. This can be extremely toxic if combined with acetaminophen.  Avoid drugs which are toxic to the liver. Some of these include isoniazid, methyldopa, acetaminophen, anabolic steroids (muscle-building drugs), erythromycin, and oral contraceptives (birth control pills). Check with your caregiver to make sure medicines you are presently taking will not be harmful.  Periodic blood tests may be required. Follow your caregiver's advice regarding the timing of these.  Milk thistle is an herbal remedy which does protect the liver against toxins. However, it will not help once the liver has been scarred. SEEK MEDICAL CARE IF:  You have increasing fatigue or weakness.  You develop swelling of the hands, feet, legs, or face.  You vomit bright red blood, or a coffee ground appearing material.  You have blood in your stools, or the stools turn black and tarry.  You have a fever.  You develop loss of appetite, or have nausea and vomiting.  You develop jaundice.  You develop easy bruising or bleeding.  You have worsening of any of the problems you are concerned about. Document Released: 08/10/2005 Document Revised: 11/02/2011 Document Reviewed: 03/28/2008 [facility] Patient Information 2014 [place].

## 2014-01-10 NOTE — Progress Notes (Addendum)
Subjective:     Patient ID: Wendy [name], female   DOB: 1961-02-01, 53 y.o.   MRN: [id number]  HPI 53 y.o. F with MMP including cirrhosis hep C/Alcohol abuse presents with history of "loosing a day" 3 days ago. Pt is having difficulty standing. Starts with shaking and feels like she is going to pass out. Reports passing out 6-7x in last 3 days with several near misses. Room feels like room spinning and feels weak. Prior to pass out pt usually lays down. Has not hit head.   Pt reports that she is constipated and has not been going to the restroom, used a fleet enema 3 days ago. Pt is only going to the restroom once a day. Pt is not taking lactulose or miralax.  Review of Systems No headaches, no fevers, occ chills, pt is feeling sleepy, but unable sleep, unsure of weight gain, no rectal bleeding. No emesis but some nausea. 1   Objective:   Physical Exam VSS Neg orthostatics, NAD pt reports mild light headedness with orthostatics RRR no mgt CTAB no wrc Nontender Abdomen, +Fluid wave No c/c/e, cold lower extremitie CN2-12 intact, horizontal nystagmus Neg asterixis HGB: 11.8     Assessment:     53 y.o. F here with recurrent syncope and hx of hep C and cirhosis. Unsure of etiology but concerning for possible electrolyte imbalance, medication interaction, or psychogenic.  - not anemic. - pt in need of additional stat labs for further work up concerning for possible hepatic encephalopathy. - pt needs to restart lactulose - send to ER for additional work up and stat labs - f/u 1 week or sooner if needed. Return  To ED PRN for worsening symptoms.  Reports klonapin 2mg  daily may be contributing, reports not taking oxycodone reassuring. Reports not drinking at this time.  Fredrik Rigger, MD OB Fellow   Addendum: pt sent to ED pt called and reported that she may be leaving AMA prior to evaluation.  Fredrik Rigger, MD OB Fellow

## 2014-01-10 NOTE — ED Notes (Signed)
Pt undressed, in gown, on monitor, continuous pulse oximetry and blood pressure cuff; family at bedside 

## 2014-01-10 NOTE — ED Notes (Signed)
Patient assisted to bedside commode.

## 2014-01-10 NOTE — ED Notes (Signed)
Jeneen Rinks, MD notified of abnormal orthostatic vital signs

## 2014-01-11 ENCOUNTER — Ambulatory Visit (INDEPENDENT_AMBULATORY_CARE_PROVIDER_SITE_OTHER): Payer: Medicare Other | Admitting: Family Medicine

## 2014-01-11 ENCOUNTER — Telehealth: Payer: Self-pay | Admitting: Family Medicine

## 2014-01-11 VITALS — BP 119/80 | HR 85 | Temp 98.2°F | Wt 178.0 lb

## 2014-01-11 DIAGNOSIS — R634 Abnormal weight loss: Secondary | ICD-10-CM

## 2014-01-11 DIAGNOSIS — I10 Essential (primary) hypertension: Secondary | ICD-10-CM | POA: Diagnosis not present

## 2014-01-11 DIAGNOSIS — R55 Syncope and collapse: Secondary | ICD-10-CM | POA: Diagnosis not present

## 2014-01-11 DIAGNOSIS — K7460 Unspecified cirrhosis of liver: Secondary | ICD-10-CM

## 2014-01-11 MED ORDER — FUROSEMIDE 40 MG PO TABS: 20.0000 mg | ORAL_TABLET | Freq: Every day | ORAL | Status: DC

## 2014-01-11 MED ORDER — CARVEDILOL 6.25 MG PO TABS: 6.2500 mg | ORAL_TABLET | Freq: Two times a day (BID) | ORAL | Status: DC

## 2014-01-11 NOTE — Telephone Encounter (Signed)
Patient was seen by Wendy [name] yesterday and transferred to the ED. She was was released around 12 am and when she woke up this morning she passed out getting something to drink. She is wanting to know what she needs to do from here. Please advise.

## 2014-01-11 NOTE — Telephone Encounter (Signed)
Pt informed.  She is not happy with this request but she is compliant.  Appt made this afternoon with Dr. Raeford Razor.  Pt request that we "check her labs and test done at the hospital so nothing is repeated". Laban Emperor Fleeger

## 2014-01-11 NOTE — Telephone Encounter (Signed)
Please let patient know that she needs to be evaluated in clinic today. If not in clinic, she can go to the urgent care.   Liam Graham, PGY-3 Family Medicine Resident

## 2014-01-11 NOTE — Telephone Encounter (Signed)
To PCP as Dr. Leslie Andrea is not in clinic again until 01/17/2014. Wendy [name]

## 2014-01-11 NOTE — Patient Instructions (Addendum)
Thank you for coming in,   Stop taking the loratidine. We are decreasing your Coreg to 6.25 mg twice a day.   I made a referral to Dr. Carlean Purl for you liver disease.   Please follow up with Dr. Otis Dials in 1 week to see how you are doing after changing the medication.    Please feel free to call with any questions or concerns at any time, at [phone]. --Dr. Raeford Razor  Near-Syncope Near-syncope (commonly known as near fainting) is sudden weakness, dizziness, or feeling like you might pass out. During an episode of near-syncope, you may also develop pale skin, have tunnel vision, or feel sick to your stomach (nauseous). Near-syncope may occur when getting up after sitting or while standing for a long time. It is caused by a sudden decrease in blood flow to the brain. This decrease can result from various causes or triggers, most of which are not serious. However, because near-syncope can sometimes be a sign of something serious, a medical evaluation is required. The specific cause is often not determined. HOME CARE INSTRUCTIONS  Monitor your condition for any changes. The following actions may help to alleviate any discomfort you are experiencing:  Have someone stay with you until you feel stable.  Lie down right away if you start feeling like you might faint. Breathe deeply and steadily. Wait until all the symptoms have passed. Most of these episodes last only a few minutes. You may feel tired for several hours.   Drink enough fluids to keep your urine clear or pale yellow.   If you are taking blood pressure or heart medicine, get up slowly when seated or lying down. Take several minutes to sit and then stand. This can reduce dizziness.  Follow up with your health care provider as directed. SEEK IMMEDIATE MEDICAL CARE IF:   You have a severe headache.   You have unusual pain in the chest, abdomen, or back.   You are bleeding from the mouth or rectum, or you have black or tarry stool.    You have an irregular or very fast heartbeat.   You have repeated fainting or have seizure-like jerking during an episode.   You faint when sitting or lying down.   You have confusion.   You have difficulty walking.   You have severe weakness.   You have vision problems.  MAKE SURE YOU:   Understand these instructions.  Will watch your condition.  Will get help right away if you are not doing well or get worse. Document Released: 08/10/2005 Document Revised: 04/12/2013 Document Reviewed: 01/13/2013 [facility] Patient Information 2014 [place].

## 2014-01-11 NOTE — ED Provider Notes (Signed)
CSN: [id number]     Arrival date & time 01/10/14  1421 History   First MD Initiated Contact with Patient 01/10/14 1847     Chief Complaint  Patient presents with  . Near Syncope      HPI  Patient presents with near syncopal episodes. States that last week he is fine if she is sitting or laying down. Whenever she stands she starts to feel lightheaded. She tries to walk she becomes progressively lightheaded. She said it suddenly said a few times but is not frankly syncope. Essentially this is one time yesterday where she was tending to quickly get to her bathroom. She fell she struck the top of her head against a cabinet. She did not strike it hard. No loss of consciousness. She has no chest pain. No swelling of her extremities. Has a history of chronic hep C and cirrhosis. Her mental score had increased and she was temporarily on a transplant list and she is no longer has her "liver settled down".  Past Medical History  Diagnosis Date  . Cirrhosis   . Hiatal hernia   . Esophageal stricture   . Anxiety disorder   . 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 Hepatitis C, chronic   . History of alcohol abuse   . Depression   . History of hip fracture   . Allergic rhinitis   . HTN (hypertension)   . Anxiety   . Blood transfusion   . Cataract     MILD  . GERD (gastroesophageal reflux disease)   . Osteoporosis   . Ulcer   . Arthritis   . Blood transfusion without reported diagnosis 2010 & 1976  . 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 Orif hip fracture  2010    right x2  . Upper gastrointestinal endoscopy  08/05/2007    esophageal ring, hiatal hernia, portal gastropathy  . Splenectomy      age 58  . Burr hole for subdural hematoma    . Upper gastrointestinal endoscopy  10/14/2011  . Colonoscopy    . Kyphoplasty Bilateral 09/21/2013    Procedure: T12 - L1 KYPHOPLASTY;   Surgeon: Melina Schools, MD;  Location: [facility];  Service: Orthopedics;  Laterality: Bilateral;   Family History  Problem Relation Age of Onset  . Heart disease Mother   . Colon cancer Neg Hx   . Other Daughter     chromosome abnormalit  . Other Daughter     micropthalmia   History  Substance Use Topics  . Smoking status: Current Some Day Smoker -- 0.25 packs/day for 10 years    Types: Cigarettes    Last Attempt to Quit: 12/23/2008  . Smokeless tobacco: Never Used  . Alcohol Use: No     Comment: goes to AA   OB History   Grav Para Term Preterm Abortions TAB SAB Ect Mult Living                 Review of Systems  Constitutional: Negative for fever, chills, diaphoresis, appetite change and fatigue.  HENT: Negative for mouth sores, sore throat and trouble swallowing.   Eyes: Negative for visual disturbance.  Respiratory: Negative for cough, chest tightness, shortness of breath and wheezing.   Cardiovascular: Negative for chest pain.  Gastrointestinal: Negative for nausea, vomiting, abdominal pain, diarrhea and abdominal distention.       No  increasing abdominal girth. Her pants are getting tight. She has no change in the amount of edema in her lower extremities.  Endocrine: Negative for polydipsia, polyphagia and polyuria.  Genitourinary: Negative for dysuria, frequency and hematuria.  Musculoskeletal: Negative for gait problem.  Skin: Negative for color change, pallor and rash.  Neurological: Negative for dizziness, syncope, light-headedness and headaches.  Hematological: Does not bruise/bleed easily.  Psychiatric/Behavioral: Negative for behavioral problems and confusion.      Allergies  Codeine phosphate  Home Medications   Prior to Admission medications   Medication Sig Start Date End Date Taking? Authorizing Provider  alendronate (FOSAMAX) 70 MG tablet Take 1 tablet (70 mg total) by mouth every Thursday. Take with a full glass of water on an empty stomach. 10/02/13  Yes  Kandis Nab, MD  calcium-vitamin D (OSCAL-500) 500-400 MG-UNIT per tablet 2 tablets 2 (two) times daily.    Yes Historical Provider, MD  carvedilol (COREG) 12.5 MG tablet Take 1 tablet (12.5 mg total) by mouth 2 (two) times daily with a meal. 11/30/13 11/30/14 Yes Kandis Nab, MD  clonazePAM (KLONOPIN) 1 MG tablet Take 1 tablet (1 mg total) by mouth 3 (three) times daily as needed for anxiety. 11/15/13  Yes Kandis Nab, MD  cyclobenzaprine (FLEXERIL) 10 MG tablet Take 1 tablet (10 mg total) by mouth 3 (three) times daily as needed for muscle spasms. 12/27/13  Yes Kandis Nab, MD  cycloSPORINE (RESTASIS) 0.05 % ophthalmic emulsion Place 1 drop into both eyes 2 (two) times daily. 10/20/12  Yes Kandis Nab, MD  docusate sodium (COLACE) 100 MG capsule Take 1 capsule (100 mg total) by mouth 2 (two) times daily. 09/22/13  Yes Benjiman Core, PA-C  furosemide (LASIX) 40 MG tablet Take 40 mg by mouth daily.   Yes Historical Provider, MD  hydrOXYzine (ATARAX/VISTARIL) 25 MG tablet Take 25 mg by mouth 4 (four) times daily as needed for itching.   Yes Historical Provider, MD  lactulose (CHRONULAC) 10 GM/15ML solution Take 45 mLs (30 g total) by mouth 4 (four) times daily as needed (for constipation). 01/10/14  Yes Allen Norris, MD  loratadine (CLARITIN) 10 MG tablet Take 1 tablet (10 mg total) by mouth daily. 03/15/13  Yes Kandis Nab, MD  Olopatadine HCl 0.2 % SOLN Place 1 drop into both eyes daily. 06/15/12  Yes Kandis Nab, MD  omeprazole (PRILOSEC) 40 MG capsule Take 1 capsule (40 mg total) by mouth daily. 07/19/13  Yes Timmothy Euler, MD  ondansetron (ZOFRAN ODT) 4 MG disintegrating tablet Take 1 tablet (4 mg total) by mouth every 8 (eight) hours as needed for nausea or vomiting. 09/22/13  Yes Benjiman Core, PA-C  polyethylene glycol (MIRALAX / GLYCOLAX) packet Take 17 g by mouth daily as needed for mild constipation.   Yes Historical Provider, MD  Prenatal 28-0.8 MG TABS Take 1  tablet by mouth daily.   Yes Historical Provider, MD  QUEtiapine (SEROQUEL) 200 MG tablet Take 200 mg by mouth at bedtime.   Yes Historical Provider, MD  rifaximin (XIFAXAN) 550 MG TABS tablet Take 550 mg by mouth 2 (two) times daily.   Yes Historical Provider, MD  spironolactone (ALDACTONE) 100 MG tablet Take 100 mg by mouth daily.   Yes Historical Provider, MD   BP 109/84  Pulse 79  Temp(Src) 98.6 F (37 C) (Oral)  Resp 10  SpO2 98% Physical Exam  Constitutional: She is oriented to person, place, and time. She appears well-developed  and well-nourished. No distress.  HENT:  Head: Normocephalic.  Eyes: Conjunctivae are normal. Pupils are equal, round, and reactive to light. No scleral icterus.  Neck: Normal range of motion. Neck supple. No thyromegaly present.  Cardiovascular: Normal rate and regular rhythm.  Exam reveals no gallop and no friction rub.   No murmur heard. Pulmonary/Chest: Effort normal and breath sounds normal. No respiratory distress. She has no wheezes. She has no rales.  Abdominal: Soft. Bowel sounds are normal. She exhibits no distension. There is no tenderness. There is no rebound.  Abdomen is moderately distended. No shifting dullness. Bedside ultrasound does show fluid in the abdomen consistent with her ascites and cirrhosis.  Musculoskeletal: Normal range of motion.  Neurological: She is alert and oriented to person, place, and time.  Skin: Skin is warm and dry. No rash noted.  Psychiatric: She has a normal mood and affect. Her behavior is normal.    ED Course  Procedures (including critical care time) Labs Review Labs Reviewed  CBC WITH DIFFERENTIAL - Abnormal; Notable for the following:    RBC 3.35 (*)    MCV 111.6 (*)    MCH 39.4 (*)    Platelets 123 (*)    Monocytes Relative 18 (*)    Monocytes Absolute 1.9 (*)    All other components within normal limits  COMPREHENSIVE METABOLIC PANEL - Abnormal; Notable for the following:    Sodium 136 (*)     Albumin 2.7 (*)    AST 70 (*)    ALT 49 (*)    Alkaline Phosphatase 154 (*)    GFR calc non Af Amer 56 (*)    GFR calc Af Amer 65 (*)    All other components within normal limits  URINALYSIS, ROUTINE W REFLEX MICROSCOPIC - Abnormal; Notable for the following:    APPearance CLOUDY (*)    Leukocytes, UA TRACE (*)    All other components within normal limits  URINE MICROSCOPIC-ADD ON - Abnormal; Notable for the following:    Squamous Epithelial / LPF FEW (*)    Bacteria, UA FEW (*)    Casts HYALINE CASTS (*)    All other components within normal limits    Imaging Review No results found.   EKG Interpretation None      MDM   Final diagnoses:  Orthostasis  Cirrhosis    Do not think this is acute cardiac process. I think is simply third spacing due to her cirrhosis. She was hydrated her symptoms resolve. I've ambulated her. She is not orthostatic. Plan continue to hydrate her self. Ask her to hold her Lasix for the next 48 hours. Further medication changes only as per her followup with her primary care physician.    Tanna Furry, MD 01/11/14 [phone]

## 2014-01-12 ENCOUNTER — Telehealth: Payer: Self-pay | Admitting: *Deleted

## 2014-01-12 ENCOUNTER — Encounter: Payer: Self-pay | Admitting: Family Medicine

## 2014-01-12 DIAGNOSIS — R634 Abnormal weight loss: Secondary | ICD-10-CM | POA: Insufficient documentation

## 2014-01-12 DIAGNOSIS — R55 Syncope and collapse: Secondary | ICD-10-CM | POA: Insufficient documentation

## 2014-01-12 NOTE — Progress Notes (Signed)
Subjective:    Patient ID: Wendy [name], female    DOB: [birth year], 53 y.o.   MRN: [id number]  HPI Wendy [name] is here for ED f/u.   She was seen yesterday in clinic and sent to the ED based on her symptoms. She was evaluated and no cardiac source and not orthostatic. Her labs were within her baseline. An EKG was unchanged and NSR.  They advised her to hold her lasix for the next couple of days and discharged her home.    She had another episode this morning where she was walking to the bathroom and had near syncope. She didn't lose her bowels or bladder. She didn't lose consciousness.  She describes being foggy prior to having the near syncope.  They last for roughly for 5 minutes. She hasn't taken her Seroquel in 2 weeks. She take klonopin 1-2 times per day. She takes atarax at night for sleep and loratadine. She has a different number of episodes in one day.   She has a history of Hep c and cirrhosis.  She hasn't started taking her lactulose recently.  She does have a 30 pound weight loss since March. She doesn't know the last time she followed up with her GI doctor. She doesn't report any leg swelling and no increased abdominal distention.  She denies any fever, nausea or vomiting.    Current Outpatient Prescriptions on File Prior to Visit  Medication Sig Dispense Refill  . alendronate (FOSAMAX) 70 MG tablet Take 1 tablet (70 mg total) by mouth every Thursday. Take with a full glass of water on an empty stomach.  60 tablet  6  . calcium-vitamin D (OSCAL-500) 500-400 MG-UNIT per tablet 2 tablets 2 (two) times daily.       . clonazePAM (KLONOPIN) 1 MG tablet Take 1 tablet (1 mg total) by mouth 3 (three) times daily as needed for anxiety.  90 tablet  0  . cycloSPORINE (RESTASIS) 0.05 % ophthalmic emulsion Place 1 drop into both eyes 2 (two) times daily.  0.4 mL  2  . docusate sodium (COLACE) 100 MG capsule Take 1 capsule (100 mg total) by mouth 2 (two) times daily.  50 capsule  0    . hydrOXYzine (ATARAX/VISTARIL) 25 MG tablet Take 25 mg by mouth 4 (four) times daily as needed for itching.      . lactulose (CHRONULAC) 10 GM/15ML solution Take 45 mLs (30 g total) by mouth 4 (four) times daily as needed (for constipation).  960 mL  6  . loratadine (CLARITIN) 10 MG tablet Take 1 tablet (10 mg total) by mouth daily.  30 tablet  11  . Olopatadine HCl 0.2 % SOLN Place 1 drop into both eyes daily.      Marland Kitchen omeprazole (PRILOSEC) 40 MG capsule Take 1 capsule (40 mg total) by mouth daily.  30 capsule  5  . polyethylene glycol (MIRALAX / GLYCOLAX) packet Take 17 g by mouth daily as needed for mild constipation.      . Prenatal 28-0.8 MG TABS Take 1 tablet by mouth daily.      . QUEtiapine (SEROQUEL) 200 MG tablet Take 200 mg by mouth at bedtime.      . rifaximin (XIFAXAN) 550 MG TABS tablet Take 550 mg by mouth 2 (two) times daily.      Marland Kitchen spironolactone (ALDACTONE) 100 MG tablet Take 100 mg by mouth daily.      . cyclobenzaprine (FLEXERIL) 10 MG tablet Take 1 tablet (10  mg total) by mouth 3 (three) times daily as needed for muscle spasms.  30 tablet  3  . ondansetron (ZOFRAN ODT) 4 MG disintegrating tablet Take 1 tablet (4 mg total) by mouth every 8 (eight) hours as needed for nausea or vomiting.  50 tablet  0   No current facility-administered medications on file prior to visit.    Review of Systems See HPI    Objective:   Physical Exam BP 119/80  Pulse 85  Temp(Src) 98.2 F (36.8 C) (Oral)  Wt 178 lb (80.74 kg) Gen: NAD, alert, cooperative with exam, well-appearing, caucasian female CV: RRR, good S1/S2, no murmur, no edema, capillary refill brisk  Resp: CTABL, no wheezes, non-labored Abd: SNTND, BS present, no guarding or organomegaly, no fluid wave,  Extremities: no edema   Positive Orthostatics upon measuring.         Assessment & Plan:

## 2014-01-12 NOTE — Assessment & Plan Note (Signed)
Patient with 30 pound weight loss since March. Doesn't report trying to lose weight. Hx of Hep C and cirrhosis. Doesn't know the last time she followed up with GI.  - referral to GI, Dr. Carlean Purl

## 2014-01-12 NOTE — Assessment & Plan Note (Addendum)
Review of chart revealed a normal ECHO. ED with normal EKG. Likely contributing from her medications and some dehydration with positive orthostatics in clinic.  - decrease Coreg to 6.25 mg BID  - Decrease lasix 20 mg daily  - stopping loratadine but still taking atarax QHS  - stopped taking seroquel 2 weeks ago.  - not reporting any drinking.  - encouraged to start taking lactulose, referral to GI for recent weight loss.  - f/u with PCP in one week to determine if changes are helping  - discussed with Dr. Ree Kida and Dr. Loma Messing.

## 2014-01-12 NOTE — Telephone Encounter (Signed)
Pt called this AM stating she had another episode of passing out yesterday when she returned home from her appt at [facility] and didn't what to do.  Pt denied any headache, abdominal pain, chest pain, dizziness, blurred vision today.  Pt stated the only medication that she took this AM was her lactulose.  Precepted with Dr. Kendra Opitz; have pt hold Lasix, hydroxyzine and loratadine.  When sitting down, do slow or getting up from bed sit on side of bed for a minute or two before raising.  Don't make any sudden movements.  Don't drive or bend down to quickly.  Pt stated understanding.  Pt to keep appt with PCP.  If she gets worse over the weekend have her go to urgent care or emergency room.

## 2014-01-17 ENCOUNTER — Telehealth: Payer: Self-pay | Admitting: Family Medicine

## 2014-01-17 NOTE — Telephone Encounter (Signed)
Called and spoke with patient and she says she is having abdominal pain, distention, and has not had a bowel movement in 7 days. I spoke with Dr Nori Riis and she suggested given the patient's symptoms that she needs to either to an UC or ER to be checked out tonight, she should not wait for her appointment on Friday. I relayed this to the patient and she said she will head on over to UC.[name]

## 2014-01-17 NOTE — Telephone Encounter (Signed)
Has not had a bowel movement in several days. She is very bloated and has some cramping. She has even tried the stuff you take when you have a colonoscopy. She has an appt with dr Otis Dials on Friday Should she come in before?

## 2014-01-18 ENCOUNTER — Encounter: Payer: Self-pay | Admitting: Family Medicine

## 2014-01-18 ENCOUNTER — Ambulatory Visit (INDEPENDENT_AMBULATORY_CARE_PROVIDER_SITE_OTHER): Payer: Medicare Other | Admitting: Family Medicine

## 2014-01-18 VITALS — BP 134/75 | HR 73 | Temp 99.2°F | Ht 62.0 in | Wt 191.0 lb

## 2014-01-18 DIAGNOSIS — K5900 Constipation, unspecified: Secondary | ICD-10-CM | POA: Diagnosis not present

## 2014-01-18 MED ORDER — GLYCERIN (LAXATIVE) 2 G RE SUPP: 1.0000 | Freq: Every day | RECTAL | Status: DC | PRN

## 2014-01-18 NOTE — Assessment & Plan Note (Signed)
A: Uncertain etiology; possible contribution of poor compliance with lactulose for cirrhosis, though pt states she has been taking it, possible viral ileus or other cause. Apparently had had constipation with pain medications in the past, as well. Only minimal steps taken at home, so far. Exam is otherwise relatively benign.  P: Continue lactulose, Colace. Increase MiraLAX to BID. Rx given for glycerin suppositories, as well. Instructed pt to use all three of these medications, as well as to increase fluid intake. If no BM in two days, instructed pt to retry enema, as this definitely helped before, per her report. Reviewed red flags that would prompt need to re-present to clinic or to proceed to the ED. Will route note to Dr. Otis Dials as well -- pt had appt scheduled with her tomorrow 5/29, but instructed pt to try these steps first, then f/u with Dr. Otis Dials next week.

## 2014-01-18 NOTE — Patient Instructions (Addendum)
Thank you for coming in, today!  I think your pain and swelling is definitely from constipation. Your blood pressure is good and your exam is okay. I don't think you need to go to the emergency room right now.  Keep taking lactulose as before. Keep taking the Colace (stool softener) as well. Start taking the Miralax (mixed with water) twice a day. You have a prescription for suppositories at your pharmacy. Take one suppository per day. If they don't help in 1-2 days try an enema like you did before.  If you get worse instead of better or if you try stuff like above and it doesn't work or if you have new fevers, worse pain, and so on, go to the emergency room. Come back to see Dr. Otis Dials in about 1 week. Please feel free to call with any questions or concerns at any time, at [phone]. --Dr. Venetia Maxon

## 2014-01-18 NOTE — Progress Notes (Signed)
   Subjective:    Patient ID: Wendy [name], female    DOB: [birth year], 53 y.o.   MRN: [id number]  HPI: Pt presents to clinic for SDA for constipation. She reports she has not had a BM since 5/20; she was sent to the ED from clinic for orthostasis and was given IVF. She was seen in the clinic for ED f/u the next day with reduction in her BP meds and was referred to Dr. Carlean Purl for hx of weight loss but has not been scheduled with him, yet. Of note, pt called the clinic 5/27 and was instructed to go to urgent care or the ED; pt reports she went to urgent care late in the evening but was told there was a 2-hour wait, and so went home.  She reports she takes lactulose for cirrhosis (she has not been taking it previously, but states she started after the clinic visit on 5/21) but she did not take it this morning. She has also been taking Miralax; she took a glass full this morning but she has not taken it and lactulose together. She reports she also has not had flatulence for around the same amount of time. She reports abdominal swelling and "bad" abdominal pain but no cramps. She states she had constipation earlier this year, with a very hard stool passed after taking lactulose one time, and with a Fleet's enema a second time. She reports a normal intake of fluids and food but does have reduced appetite currently. She has taken no medications other than ibuprofen for pain, which didn't help much; she reports she is not supposed to take due to liver disease.  Review of Systems: As above. Also reports she has had reduced urination. Pt has had no fevers but some vaguely-described chills. She does have chronic SOB.      Objective:   Physical Exam BP 134/75  Pulse 73  Temp(Src) 99.2 F (37.3 C) (Oral)  Ht 5\' 2"  (1.575 m)  Wt 191 lb (86.637 kg)  BMI 34.93 kg/m2 Gen: uncomfortable but nontoxic appearing adult female in NAD HEENT: [place]/AT, EOMI, PERRLA, MMM Cardio: RRR, no murmur appreciated Pulm: CTAB,  no wheezes Abd: markedly distended, but only mildly tender to palpation  BS faint but active, no masses appreciated Ext: warm, well-perfused, no LE edema     Assessment & Plan:

## 2014-01-19 ENCOUNTER — Ambulatory Visit: Payer: Medicare Other | Admitting: Family Medicine

## 2014-01-19 ENCOUNTER — Telehealth: Payer: Self-pay | Admitting: Emergency Medicine

## 2014-01-19 NOTE — Telephone Encounter (Signed)
Emergency Line Call Patient calling regarding constipation.  She is taking colace, lactulose, and miralax.  She has also used glycerin suppositories the last 2 days.  She was seen in the office yesterday for this issue as well.  Last BM was about 10 days ago.  No nausea or vomiting.  Recommended doing an OTC FLEET enema tonight.  If no BM by tomorrow morning, recommended going to [facility] for additional evaluation.  She expressed understanding and agreement with this plan.  All questions were answered.

## 2014-01-20 ENCOUNTER — Telehealth: Payer: Self-pay | Admitting: Emergency Medicine

## 2014-01-20 NOTE — Telephone Encounter (Signed)
Emergency Line Call Patient called to let me know the enema has not worked.  She also reports some burning pain in the upper half of her body and states she cannot walk more than a few steps without getting dizzy.  I encouraged her to go the ED for additional evaluation as discussed last night.  She will give it another hour, then call a friend or EMS to take her to the ED if no improvement.

## 2014-01-23 ENCOUNTER — Ambulatory Visit: Payer: Medicare Other | Admitting: Family Medicine

## 2014-01-24 ENCOUNTER — Other Ambulatory Visit: Payer: Self-pay

## 2014-01-24 ENCOUNTER — Inpatient Hospital Stay (HOSPITAL_COMMUNITY)
Admission: EM | Admit: 2014-01-24 | Discharge: 2014-01-26 | DRG: 433 | Disposition: A | Discharge status: Home / Self Care | Payer: Medicare Other | Attending: Family Medicine | Admitting: Family Medicine

## 2014-01-24 ENCOUNTER — Encounter: Payer: Self-pay | Admitting: Family Medicine

## 2014-01-24 ENCOUNTER — Ambulatory Visit (INDEPENDENT_AMBULATORY_CARE_PROVIDER_SITE_OTHER): Payer: Medicare Other | Admitting: Family Medicine

## 2014-01-24 ENCOUNTER — Other Ambulatory Visit: Payer: Self-pay | Admitting: Family Medicine

## 2014-01-24 ENCOUNTER — Encounter (HOSPITAL_COMMUNITY): Payer: Self-pay | Admitting: Emergency Medicine

## 2014-01-24 VITALS — BP 108/76 | HR 79 | Temp 97.6°F

## 2014-01-24 DIAGNOSIS — N179 Acute kidney failure, unspecified: Secondary | ICD-10-CM | POA: Diagnosis not present

## 2014-01-24 DIAGNOSIS — K449 Diaphragmatic hernia without obstruction or gangrene: Secondary | ICD-10-CM | POA: Diagnosis present

## 2014-01-24 DIAGNOSIS — K7200 Acute and subacute hepatic failure without coma: Secondary | ICD-10-CM

## 2014-01-24 DIAGNOSIS — K222 Esophageal obstruction: Secondary | ICD-10-CM | POA: Diagnosis not present

## 2014-01-24 DIAGNOSIS — K7682 Hepatic encephalopathy: Secondary | ICD-10-CM

## 2014-01-24 DIAGNOSIS — K7290 Hepatic failure, unspecified without coma: Secondary | ICD-10-CM | POA: Diagnosis not present

## 2014-01-24 DIAGNOSIS — L259 Unspecified contact dermatitis, unspecified cause: Secondary | ICD-10-CM | POA: Diagnosis present

## 2014-01-24 DIAGNOSIS — F4001 Agoraphobia with panic disorder: Secondary | ICD-10-CM | POA: Diagnosis present

## 2014-01-24 DIAGNOSIS — F17200 Nicotine dependence, unspecified, uncomplicated: Secondary | ICD-10-CM | POA: Diagnosis present

## 2014-01-24 DIAGNOSIS — K2289 Other specified disease of esophagus: Secondary | ICD-10-CM | POA: Diagnosis present

## 2014-01-24 DIAGNOSIS — K7460 Unspecified cirrhosis of liver: Secondary | ICD-10-CM

## 2014-01-24 DIAGNOSIS — M810 Age-related osteoporosis without current pathological fracture: Secondary | ICD-10-CM | POA: Diagnosis present

## 2014-01-24 DIAGNOSIS — E7220 Disorder of urea cycle metabolism, unspecified: Secondary | ICD-10-CM | POA: Diagnosis present

## 2014-01-24 DIAGNOSIS — K766 Portal hypertension: Secondary | ICD-10-CM | POA: Diagnosis present

## 2014-01-24 DIAGNOSIS — K5900 Constipation, unspecified: Secondary | ICD-10-CM | POA: Diagnosis present

## 2014-01-24 DIAGNOSIS — K220 Achalasia of cardia: Secondary | ICD-10-CM | POA: Diagnosis present

## 2014-01-24 DIAGNOSIS — K2960 Other gastritis without bleeding: Secondary | ICD-10-CM | POA: Diagnosis present

## 2014-01-24 DIAGNOSIS — Z79899 Other long term (current) drug therapy: Secondary | ICD-10-CM

## 2014-01-24 DIAGNOSIS — H1045 Other chronic allergic conjunctivitis: Secondary | ICD-10-CM | POA: Diagnosis present

## 2014-01-24 DIAGNOSIS — K228 Other specified diseases of esophagus: Secondary | ICD-10-CM | POA: Diagnosis present

## 2014-01-24 DIAGNOSIS — R55 Syncope and collapse: Secondary | ICD-10-CM | POA: Diagnosis present

## 2014-01-24 DIAGNOSIS — D689 Coagulation defect, unspecified: Secondary | ICD-10-CM | POA: Diagnosis present

## 2014-01-24 DIAGNOSIS — K219 Gastro-esophageal reflux disease without esophagitis: Secondary | ICD-10-CM | POA: Diagnosis present

## 2014-01-24 DIAGNOSIS — F411 Generalized anxiety disorder: Secondary | ICD-10-CM | POA: Diagnosis present

## 2014-01-24 DIAGNOSIS — W19XXXA Unspecified fall, initial encounter: Secondary | ICD-10-CM | POA: Insufficient documentation

## 2014-01-24 DIAGNOSIS — Y92009 Unspecified place in unspecified non-institutional (private) residence as the place of occurrence of the external cause: Secondary | ICD-10-CM

## 2014-01-24 DIAGNOSIS — K922 Gastrointestinal hemorrhage, unspecified: Secondary | ICD-10-CM | POA: Diagnosis not present

## 2014-01-24 DIAGNOSIS — R188 Other ascites: Secondary | ICD-10-CM | POA: Diagnosis present

## 2014-01-24 DIAGNOSIS — K319 Disease of stomach and duodenum, unspecified: Secondary | ICD-10-CM | POA: Diagnosis present

## 2014-01-24 DIAGNOSIS — E860 Dehydration: Secondary | ICD-10-CM | POA: Diagnosis present

## 2014-01-24 DIAGNOSIS — D539 Nutritional anemia, unspecified: Secondary | ICD-10-CM | POA: Diagnosis present

## 2014-01-24 DIAGNOSIS — D649 Anemia, unspecified: Secondary | ICD-10-CM

## 2014-01-24 DIAGNOSIS — D1330 Benign neoplasm of unspecified part of small intestine: Secondary | ICD-10-CM | POA: Diagnosis not present

## 2014-01-24 HISTORY — DX: Unspecified cirrhosis of liver: B18.2

## 2014-01-24 HISTORY — DX: Anxiety disorder, unspecified: F41.9

## 2014-01-24 HISTORY — DX: Unspecified cirrhosis of liver: K74.60

## 2014-01-24 HISTORY — DX: Major depressive disorder, single episode, unspecified: F32.9

## 2014-01-24 LAB — COMPREHENSIVE METABOLIC PANEL
ALBUMIN: 2.3 g/dL — AB (ref 3.5–5.2)
ALK PHOS: 134 U/L — AB (ref 39–117)
ALT: 36 U/L — ABNORMAL HIGH (ref 0–35)
ALT: 35 U/L (ref 0–35)
AST: 55 U/L — ABNORMAL HIGH (ref 0–37)
AST: 58 U/L — ABNORMAL HIGH (ref 0–37)
Albumin: 2.4 g/dL — ABNORMAL LOW (ref 3.5–5.2)
Alkaline Phosphatase: 138 U/L — ABNORMAL HIGH (ref 39–117)
BUN: 95 mg/dL — AB (ref 6–23)
BUN: 87 mg/dL — ABNORMAL HIGH (ref 6–23)
CO2: 22 mEq/L (ref 19–32)
CO2: 20 meq/L (ref 19–32)
CREATININE: 2.21 mg/dL — AB (ref 0.50–1.10)
Calcium: 9.3 mg/dL (ref 8.4–10.5)
Calcium: 9.8 mg/dL (ref 8.4–10.5)
Chloride: 103 mEq/L (ref 96–112)
Chloride: 106 mEq/L (ref 96–112)
Creat: 2.49 mg/dL — ABNORMAL HIGH (ref 0.50–1.10)
GFR, EST AFRICAN AMERICAN: 28 mL/min — AB (ref >90)
GFR, EST NON AFRICAN AMERICAN: 24 mL/min — AB (ref >90)
GLUCOSE: 187 mg/dL — AB (ref 70–99)
Glucose, Bld: 118 mg/dL — ABNORMAL HIGH (ref 70–99)
POTASSIUM: 4.1 meq/L (ref 3.5–5.3)
Potassium: 4.4 mEq/L (ref 3.7–5.3)
SODIUM: 140 meq/L (ref 135–145)
Sodium: 138 mEq/L (ref 137–147)
TOTAL PROTEIN: 5.8 g/dL — AB (ref 6.0–8.3)
TOTAL PROTEIN: 6.2 g/dL (ref 6.0–8.3)
Total Bilirubin: 0.6 mg/dL (ref 0.3–1.2)
Total Bilirubin: 0.6 mg/dL (ref 0.3–1.2)

## 2014-01-24 LAB — CBC
HCT: 20.8 % — ABNORMAL LOW (ref 36.0–46.0)
HEMOGLOBIN: 7.4 g/dL — AB (ref 12.0–15.0)
MCH: 39.4 pg — ABNORMAL HIGH (ref 26.0–34.0)
MCHC: 35.6 g/dL (ref 30.0–36.0)
MCV: 110.6 fL — ABNORMAL HIGH (ref 78.0–100.0)
PLATELETS: 157 10*3/uL (ref 150–400)
RBC: 1.88 MIL/uL — ABNORMAL LOW (ref 3.87–5.11)
RDW: 15.9 % — ABNORMAL HIGH (ref 11.5–15.5)
WBC: 10.7 10*3/uL — AB (ref 4.0–10.5)

## 2014-01-24 LAB — PROTIME-INR
INR: 1.26 (ref <1.50)
PROTHROMBIN TIME: 15.5 s — AB (ref 11.6–15.2)

## 2014-01-24 LAB — PREPARE RBC (CROSSMATCH)

## 2014-01-24 LAB — POC OCCULT BLOOD, ED: FECAL OCCULT BLD: POSITIVE — AB

## 2014-01-24 LAB — AMMONIA: AMMONIA: 87 umol/L — AB (ref 16–53)

## 2014-01-24 MED ORDER — LACTULOSE 10 GM/15ML PO SOLN
10.0000 g | Freq: Once | ORAL | Status: AC
  Administered 2014-01-24: 10 g via ORAL
  Filled 2014-01-24: qty 15

## 2014-01-24 MED ORDER — PANTOPRAZOLE SODIUM 40 MG IV SOLR
40.0000 mg | Freq: Once | INTRAVENOUS | Status: AC
  Administered 2014-01-24: 40 mg via INTRAVENOUS
  Filled 2014-01-24: qty 40

## 2014-01-24 MED ORDER — SODIUM CHLORIDE 0.9 % IV BOLUS (SEPSIS)
1000.0000 mL | Freq: Once | INTRAVENOUS | Status: AC
  Administered 2014-01-24: 1000 mL via INTRAVENOUS

## 2014-01-24 NOTE — ED Notes (Signed)
Blood  Drawn today in the clinic

## 2014-01-24 NOTE — ED Provider Notes (Signed)
CSN: [id number]     Arrival date & time 01/24/14  1949 History   First MD Initiated Contact with Patient 01/24/14 2144     Chief Complaint  Patient presents with  . Fall  . Fatigue     (Consider location/radiation/quality/duration/timing/severity/associated sxs/prior Treatment) HPI Comments: 53 year old female with depression, high blood pressure, hepatic cirrhosis, hepatitis C, alcohol abuse, reflux, encephalopathy history presents with gradually worsening general fatigue and weakness the past 2 weeks and abnormal labs. Patient had labs done earlier today with hemoglobin and renal function worse than normal. Patient does not regularly take her lactulose but did have one dose earlier today. Patient has had multiple episodes of near syncope with standing and feels dehydrated. Patient denies chest pain or shortness of breath or head injury. Patient feels she always catches herself or slowly lower she self down the ground. Patient denies cardiac history. Patient denies gross bleeding.  Patient is a 53 y.o. female presenting with fall. The history is provided by the patient and medical records.  Fall Pertinent negatives include no chest pain, no abdominal pain, no headaches and no shortness of breath.    Past Medical History  Diagnosis Date  . Cirrhosis   . Hiatal hernia   . Esophageal stricture   . Anxiety disorder   . 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 Hepatitis C, chronic   . History of alcohol abuse   . Depression   . History of hip fracture   . Allergic rhinitis   . HTN (hypertension)   . Anxiety   . Blood transfusion   . Cataract     MILD  . GERD (gastroesophageal reflux disease)   . Osteoporosis   . Ulcer   . Arthritis   . Blood transfusion without reported diagnosis 2010 & 1976  . 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 Orif hip fracture  2010    right x2  .  Upper gastrointestinal endoscopy  08/05/2007    esophageal ring, hiatal hernia, portal gastropathy  . Splenectomy      age 33  . Burr hole for subdural hematoma    . Upper gastrointestinal endoscopy  10/14/2011  . Colonoscopy    . Kyphoplasty Bilateral 09/21/2013    Procedure: T12 - L1 KYPHOPLASTY;  Surgeon: Melina Schools, MD;  Location: [place];  Service: Orthopedics;  Laterality: Bilateral;   Family History  Problem Relation Age of Onset  . Heart disease Mother   . Colon cancer Neg Hx   . Other Daughter     chromosome abnormalit  . Other Daughter     micropthalmia   History  Substance Use Topics  . Smoking status: Current Some Day Smoker -- 0.25 packs/day for 10 years    Types: Cigarettes    Last Attempt to Quit: 12/23/2008  . Smokeless tobacco: Never Used  . Alcohol Use: No     Comment: goes to AA   OB History   Grav Para Term Preterm Abortions TAB SAB Ect Mult Living                 Review of Systems  Constitutional: Positive for appetite change and fatigue. Negative for fever and chills.  HENT: Negative for congestion.   Eyes: Negative for visual disturbance.  Respiratory: Negative for shortness of breath.   Cardiovascular: Negative for chest pain.  Gastrointestinal: Positive for nausea. Negative for  vomiting, abdominal pain and blood in stool.  Genitourinary: Negative for dysuria and flank pain.  Musculoskeletal: Negative for back pain, neck pain and neck stiffness.  Skin: Negative for rash.  Neurological: Positive for syncope, weakness (Gen.) and light-headedness. Negative for headaches.      Allergies  Codeine phosphate  Home Medications   Prior to Admission medications   Medication Sig Start Date End Date Taking? Authorizing Provider  alendronate (FOSAMAX) 70 MG tablet Take 1 tablet (70 mg total) by mouth every Thursday. Take with a full glass of water on an empty stomach. 10/02/13  Yes Kandis Nab, MD  calcium-vitamin D (OSCAL-500) 500-400 MG-UNIT per  tablet 2 tablets 2 (two) times daily.    Yes Historical Provider, MD  carvedilol (COREG) 6.25 MG tablet Take 1 tablet (6.25 mg total) by mouth 2 (two) times daily with a meal. 01/11/14  Yes Rosemarie Ax, MD  clonazePAM (KLONOPIN) 1 MG tablet Take 1 tablet (1 mg total) by mouth 3 (three) times daily as needed for anxiety. 11/15/13  Yes Kandis Nab, MD  cyclobenzaprine (FLEXERIL) 10 MG tablet Take 1 tablet (10 mg total) by mouth 3 (three) times daily as needed for muscle spasms. 12/27/13  Yes Kandis Nab, MD  cycloSPORINE (RESTASIS) 0.05 % ophthalmic emulsion Place 1 drop into both eyes 2 (two) times daily. 10/20/12  Yes Kandis Nab, MD  docusate sodium (COLACE) 100 MG capsule Take 1 capsule (100 mg total) by mouth 2 (two) times daily. 09/22/13  Yes Benjiman Core, PA-C  furosemide (LASIX) 40 MG tablet Take 0.5 tablets (20 mg total) by mouth daily. 01/11/14  Yes Rosemarie Ax, MD  glycerin adult (GLYCERIN ADULT) 2 G SUPP Place 1 suppository rectally daily as needed for moderate constipation. 01/18/14  Yes Sharon [address], MD  hydrOXYzine (ATARAX/VISTARIL) 25 MG tablet Take 25 mg by mouth 4 (four) times daily as needed for itching.   Yes Historical Provider, MD  lactulose (CHRONULAC) 10 GM/15ML solution Take 45 mLs (30 g total) by mouth 4 (four) times daily as needed (for constipation). 01/10/14  Yes Allen Norris, MD  loratadine (CLARITIN) 10 MG tablet Take 1 tablet (10 mg total) by mouth daily. 03/15/13  Yes Kandis Nab, MD  Olopatadine HCl 0.2 % SOLN Place 1 drop into both eyes daily. 06/15/12  Yes Kandis Nab, MD  omeprazole (PRILOSEC) 40 MG capsule Take 1 capsule (40 mg total) by mouth daily. 07/19/13  Yes Timmothy Euler, MD  ondansetron (ZOFRAN ODT) 4 MG disintegrating tablet Take 1 tablet (4 mg total) by mouth every 8 (eight) hours as needed for nausea or vomiting. 09/22/13  Yes Benjiman Core, PA-C  polyethylene glycol (MIRALAX / GLYCOLAX) packet Take 17 g by mouth daily as  needed for mild constipation.   Yes Historical Provider, MD  Prenatal 28-0.8 MG TABS Take 1 tablet by mouth daily.   Yes Historical Provider, MD  QUEtiapine (SEROQUEL) 200 MG tablet Take 200 mg by mouth at bedtime.   Yes Historical Provider, MD  rifaximin (XIFAXAN) 550 MG TABS tablet Take 550 mg by mouth 2 (two) times daily.   Yes Historical Provider, MD  spironolactone (ALDACTONE) 100 MG tablet Take 100 mg by mouth daily.   Yes Historical Provider, MD   BP 133/85  Pulse 86  Temp(Src) 97.6 F (36.4 C) (Oral)  Resp 16  Ht 5\' 2"  (1.575 m)  Wt 190 lb (86.183 kg)  BMI 34.74 kg/m2  SpO2 99% Physical Exam  Nursing note and vitals reviewed. Constitutional: She is oriented to person, place, and time. She appears well-developed and well-nourished.  HENT:  Head: Normocephalic and atraumatic.  Dry mucous membranes  Eyes: Conjunctivae are normal. Right eye exhibits no discharge. Left eye exhibits no discharge.  Neck: Normal range of motion. Neck supple. No tracheal deviation present.  Cardiovascular: Normal rate and regular rhythm.   Pulmonary/Chest: Effort normal and breath sounds normal.  Abdominal: Soft. She exhibits distension (mild). There is no tenderness. There is no guarding.  Musculoskeletal: She exhibits no edema.  Patient denies pain with range of motion of hips. No midline tenderness in cervical lumbar thoracic. Full range of motion head and neck without pain.  Neurological: She is alert and oriented to person, place, and time.  Mild slowing during discussion however patient alert and oriented. Neck supple. Patient moves extremities with 5+ and equal bilateral grossly flexion extension. Finger nose intact. Extraocular muscle function muscle function and cranial nerves grossly intact.  Skin: Skin is warm. No rash noted.  Psychiatric: She has a normal mood and affect.    ED Course  Procedures (including critical care time) CRITICAL CARE Performed by: Mariea Clonts   Total  critical care time: 35 min  Critical care time was exclusive of separately billable procedures and treating other patients.  Critical care was necessary to treat or prevent imminent or life-threatening deterioration.  Critical care was time spent personally by me on the following activities: development of treatment plan with patient and/or surrogate as well as nursing, discussions with consultants, evaluation of patient's response to treatment, examination of patient, obtaining history from patient or surrogate, ordering and performing treatments and interventions, ordering and review of laboratory studies, ordering and review of radiographic studies, pulse oximetry and re-evaluation of patient's condition.  Labs Review Labs Reviewed  CBC WITH DIFFERENTIAL - Abnormal; Notable for the following:    WBC 16.6 (*)    RBC 1.97 (*)    Hemoglobin 8.0 (*)    HCT 22.4 (*)    MCV 113.7 (*)    MCH 40.6 (*)    RDW 16.1 (*)    Monocytes Relative 16 (*)    Neutro Abs 10.5 (*)    Monocytes Absolute 2.7 (*)    All other components within normal limits  COMPREHENSIVE METABOLIC PANEL - Abnormal; Notable for the following:    Glucose, Bld 118 (*)    BUN 87 (*)    Creatinine, Ser 2.21 (*)    Albumin 2.4 (*)    AST 58 (*)    Alkaline Phosphatase 138 (*)    GFR calc non Af Amer 24 (*)    GFR calc Af Amer 28 (*)    All other components within normal limits  POC OCCULT BLOOD, ED - Abnormal; Notable for the following:    Fecal Occult Bld POSITIVE (*)    All other components within normal limits  URINALYSIS, ROUTINE W REFLEX MICROSCOPIC  TYPE AND SCREEN  PREPARE RBC (CROSSMATCH)    Imaging Review No results found.   EKG Interpretation None     EKG reviewed heart rate 84, sinus rhythm, poor baseline, nonspecific T wave abnormalities, normal QT, normal axis MDM   Final diagnoses:  Cirrhosis  Acute hepatic encephalopathy  GI bleed  Symptomatic anemia  Pre-syncope  Acute renal failure    Patient with worsening anemia, worsening renal function, known cirrhosis with LFT elevation and near syncope episodes. Her syncope is always with standing and concern for orthostasis. Fluid  bolus ordered. Reviewed recent blood work and plan repeat look for significant changes. Patient denies obvious bleeding the plan for [facility] GI. Type and screen ordered. No obvious injuries on exam patient feels that she catches herself prior falls. Plan for admission. Pt denies abdo pain/ fevers.    Patient hemoglobin down significant compared to last hemoglobin. Repeat hemoglobin 8 and patient symptomatic lightheadedness and feels they should pass out. Helps further IV fluids. Ordered 1 unit of packed or blood cells. Discussed risks and benefits of receiving blood and patient has received in the past. Patient brown stool however Hemoccult-positive. Patient denies known history of varices or ulcers.  Recheck patient blood pressure in the 90s and she feels overall okay  I discussed with gastroenterology physician on call and they will see patient in the morning or family practice will call them if worsening bleeding.  [facility] for admission. The patients results and plan were reviewed and discussed.   Any x-rays performed were personally reviewed by myself.   Differential diagnosis were considered with the presenting HPI.   Filed Vitals:   01/24/14 2008 01/24/14 2155  BP: 97/66 133/85  Pulse: 86   Temp: 97.6 F (36.4 C) 97.6 F (36.4 C)  TempSrc:  Oral  Resp: 20 16  Height: 5\' 2"  (1.575 m)   Weight: 190 lb (86.183 kg)   SpO2: 100% 99%    Admission/ observation were discussed with the admitting physician, patient and/or family and they are comfortable with the plan.      Mariea Clonts, MD 01/25/14 [phone]

## 2014-01-24 NOTE — ED Notes (Signed)
Pt states that she fell today, pt states that she has felt weak and was attempting to get the her doctor to have blood work and a checkup. Pt states that she was called after the lab results and told to follow up in the E.D. For dehydration, renal function testing and low hemoglobin (according to blood work from PCP.) pt states she does not feel like she is dehydrated. Pt able to move all extremities, follow commands and is alert and oriented.

## 2014-01-24 NOTE — Progress Notes (Signed)
   Subjective:    Patient ID: Wendy [name], female    DOB: [birth year], 53 y.o.   MRN: [id number]  HPI: Pt presents to clinic for SDA for leg weakness and falls at home. She states she can't stand up and walk without help from feeling very weak in her legs. She states she feels dizzy. She has fallen numerous times at home; she states she gets short of breath and "leans over," but it is not clear if she is losing consciousness completely. When she feels things "coming on," she states she can't move due to worrying from what it is from. She is somewhat vague about what her symptoms specifically are, but overall they sound moderately severe. This has been going on daily, for a week or more.  She has a poor appetite, chronically. She states she drinks fluids well, even if she doesn't eat much. Of note, pt has been seen recently for constipation, for which the recommended treatments have been helping (see recent problem list note). She has not been taking lactulose regularly.  She has not been taking Klonopin regularly, only as needed (she took one this morning before coming to the doctor); she is not taking Flexeril regularly and cites difficulties getting it filled (per our records, it was ordered on 5/6 with 3 refills, #90).   Review of Systems: As above.     Objective:   Physical Exam BP 108/76  Pulse 79  Temp(Src) 97.6 F (36.4 C) (Oral) Gen: chronically-ill-appearing female in NAD, sitting in wheelchair Cardio: RRR, no murmur Pulm: CTAB, no wheeze Abd: soft, nontender Neuro/Psych: slightly somnolent, but awake / oriented x3  No obvious / gross deficit  Pt unable to stand longer than a few seconds due to "unsteadiness," unable to perform orthostatics  Unable to perform formal exam due to pt tolerance     Assessment & Plan:    Greater than 30 minutes was spent in face-to-face time with this patient, more than half of which was devoted to counseling and development/coordination of  care and treatment planning. A: 53yo female with chronic liver disease and recent constipation, with constellation of symptoms suggestive of multifactorial near-syncope, leading to several falls at home. Falls / near-syncope possibly related to dehydration / poor PO intake and/or hypotension, and possible component of hepatic encephalopathy, especially given poor compliance with lactulose. No signs / symptoms to suggest SBP.  P: Precepted with Dr. Lindell Noe; CMP, CBC, PT/INR, and ammonia level drawn, with initial plan for pt to hold BP meds and sedating meds tonight and tomorrow, then f/u with PCP Dr. Otis Dials tomorrow (appt already scheduled). Discussed option of referring pt to ED or directly placing pt in observation at least overnight from clinic, but she preferred and was comfortable with getting labs and going home.  - Labs resulted with Cr 2.5 and Hb <8, and ammonia 87.  - Called pt at home after clinic to relay these labs and to advise that she proceed to the ED for evaluation tonight. - would strongly consider abdominal imaging to eval for source of bleed or infection and could consider head imaging given falls - would also consider at least bolus IVF and evaluation for improvement in symptoms - likely warrants observation at least if not inpt admission; FPTS overnight resident (Dr. Sheral Apley) aware, if pt does need admission  Emmaline Kluver, MD PGY-2, [facility] 01/24/2014, 6:29 PM [facility] pager: [phone] (text pages welcome through [facility])

## 2014-01-24 NOTE — ED Notes (Signed)
The pt reports that she has had falling out spells for one week.  She saw her doctor today and she was called at home and was told her blood work was  abd numerus studies

## 2014-01-24 NOTE — Patient Instructions (Addendum)
Thank you for coming in, today!  I think there are several things that might be causing your weakness. It could be related to your blood pressure, your liver disease, or something else.  Don't take your blood pressure medicines tonight or tomorrow (carvedilol, Lasix, spironolactone). DO take the lactulose, tonight and tomorrow. I will check some bloodwork today that Dr. Otis Dials can see tomorrow. You have an appointment with her tomorrow at 2:15 in the afternoon.  If tonight things get worse or if you fall again, especially if you hit your head, call the emergency number or just go straight to the emergency room. Please feel free to call with any questions or concerns at any time, at [phone]. --Dr. Venetia Maxon

## 2014-01-24 NOTE — ED Notes (Signed)
Pt has bedside commode in room. Pt attempted to provide urine sample. Pt unable to void at this time.

## 2014-01-25 ENCOUNTER — Ambulatory Visit: Payer: Medicare Other | Admitting: Family Medicine

## 2014-01-25 DIAGNOSIS — E860 Dehydration: Secondary | ICD-10-CM | POA: Diagnosis present

## 2014-01-25 DIAGNOSIS — F411 Generalized anxiety disorder: Secondary | ICD-10-CM | POA: Diagnosis present

## 2014-01-25 DIAGNOSIS — D689 Coagulation defect, unspecified: Secondary | ICD-10-CM | POA: Diagnosis not present

## 2014-01-25 DIAGNOSIS — K2289 Other specified disease of esophagus: Secondary | ICD-10-CM | POA: Diagnosis present

## 2014-01-25 DIAGNOSIS — R188 Other ascites: Secondary | ICD-10-CM | POA: Diagnosis present

## 2014-01-25 DIAGNOSIS — K7682 Hepatic encephalopathy: Secondary | ICD-10-CM | POA: Diagnosis not present

## 2014-01-25 DIAGNOSIS — K922 Gastrointestinal hemorrhage, unspecified: Secondary | ICD-10-CM | POA: Diagnosis not present

## 2014-01-25 DIAGNOSIS — H1045 Other chronic allergic conjunctivitis: Secondary | ICD-10-CM | POA: Diagnosis present

## 2014-01-25 DIAGNOSIS — K7460 Unspecified cirrhosis of liver: Secondary | ICD-10-CM | POA: Diagnosis not present

## 2014-01-25 DIAGNOSIS — D1330 Benign neoplasm of unspecified part of small intestine: Secondary | ICD-10-CM | POA: Diagnosis not present

## 2014-01-25 DIAGNOSIS — K766 Portal hypertension: Secondary | ICD-10-CM | POA: Diagnosis present

## 2014-01-25 DIAGNOSIS — D539 Nutritional anemia, unspecified: Secondary | ICD-10-CM | POA: Diagnosis present

## 2014-01-25 DIAGNOSIS — K5900 Constipation, unspecified: Secondary | ICD-10-CM | POA: Diagnosis present

## 2014-01-25 DIAGNOSIS — K220 Achalasia of cardia: Secondary | ICD-10-CM | POA: Diagnosis present

## 2014-01-25 DIAGNOSIS — K222 Esophageal obstruction: Secondary | ICD-10-CM | POA: Diagnosis not present

## 2014-01-25 DIAGNOSIS — F4001 Agoraphobia with panic disorder: Secondary | ICD-10-CM | POA: Diagnosis present

## 2014-01-25 DIAGNOSIS — K319 Disease of stomach and duodenum, unspecified: Secondary | ICD-10-CM | POA: Diagnosis present

## 2014-01-25 DIAGNOSIS — K7290 Hepatic failure, unspecified without coma: Secondary | ICD-10-CM | POA: Diagnosis not present

## 2014-01-25 DIAGNOSIS — E7220 Disorder of urea cycle metabolism, unspecified: Secondary | ICD-10-CM | POA: Diagnosis present

## 2014-01-25 DIAGNOSIS — N179 Acute kidney failure, unspecified: Secondary | ICD-10-CM | POA: Diagnosis not present

## 2014-01-25 DIAGNOSIS — M810 Age-related osteoporosis without current pathological fracture: Secondary | ICD-10-CM | POA: Diagnosis present

## 2014-01-25 DIAGNOSIS — K2960 Other gastritis without bleeding: Secondary | ICD-10-CM | POA: Diagnosis present

## 2014-01-25 DIAGNOSIS — K228 Other specified diseases of esophagus: Secondary | ICD-10-CM | POA: Diagnosis present

## 2014-01-25 DIAGNOSIS — R55 Syncope and collapse: Secondary | ICD-10-CM | POA: Diagnosis present

## 2014-01-25 DIAGNOSIS — F17200 Nicotine dependence, unspecified, uncomplicated: Secondary | ICD-10-CM | POA: Diagnosis present

## 2014-01-25 DIAGNOSIS — Z79899 Other long term (current) drug therapy: Secondary | ICD-10-CM | POA: Diagnosis not present

## 2014-01-25 DIAGNOSIS — L259 Unspecified contact dermatitis, unspecified cause: Secondary | ICD-10-CM | POA: Diagnosis present

## 2014-01-25 DIAGNOSIS — K449 Diaphragmatic hernia without obstruction or gangrene: Secondary | ICD-10-CM | POA: Diagnosis not present

## 2014-01-25 DIAGNOSIS — K219 Gastro-esophageal reflux disease without esophagitis: Secondary | ICD-10-CM | POA: Diagnosis not present

## 2014-01-25 LAB — CBC
HCT: 24.1 % — ABNORMAL LOW (ref 36.0–46.0)
HEMOGLOBIN: 8.6 g/dL — AB (ref 12.0–15.0)
MCH: 35.8 pg — ABNORMAL HIGH (ref 26.0–34.0)
MCHC: 35.7 g/dL (ref 30.0–36.0)
MCV: 100.4 fL — ABNORMAL HIGH (ref 78.0–100.0)
Platelets: 146 10*3/uL — ABNORMAL LOW (ref 150–400)
RBC: 2.4 MIL/uL — AB (ref 3.87–5.11)
RDW: 23.3 % — ABNORMAL HIGH (ref 11.5–15.5)
WBC: 13.6 10*3/uL — AB (ref 4.0–10.5)

## 2014-01-25 LAB — URINALYSIS, ROUTINE W REFLEX MICROSCOPIC
BILIRUBIN URINE: NEGATIVE
Glucose, UA: NEGATIVE mg/dL
Hgb urine dipstick: NEGATIVE
KETONES UR: NEGATIVE mg/dL
LEUKOCYTES UA: NEGATIVE
NITRITE: NEGATIVE
PROTEIN: NEGATIVE mg/dL
Specific Gravity, Urine: 1.014 (ref 1.005–1.030)
UROBILINOGEN UA: 0.2 mg/dL (ref 0.0–1.0)
pH: 6 (ref 5.0–8.0)

## 2014-01-25 LAB — COMPREHENSIVE METABOLIC PANEL
ALK PHOS: 112 U/L (ref 39–117)
ALT: 32 U/L (ref 0–35)
AST: 50 U/L — ABNORMAL HIGH (ref 0–37)
Albumin: 2.1 g/dL — ABNORMAL LOW (ref 3.5–5.2)
BUN: 79 mg/dL — AB (ref 6–23)
CALCIUM: 9.1 mg/dL (ref 8.4–10.5)
CO2: 20 mEq/L (ref 19–32)
CREATININE: 1.82 mg/dL — AB (ref 0.50–1.10)
Chloride: 107 mEq/L (ref 96–112)
GFR calc non Af Amer: 30 mL/min — ABNORMAL LOW (ref >90)
GFR, EST AFRICAN AMERICAN: 35 mL/min — AB (ref >90)
GLUCOSE: 105 mg/dL — AB (ref 70–99)
Potassium: 3.7 mEq/L (ref 3.7–5.3)
Sodium: 142 mEq/L (ref 137–147)
TOTAL PROTEIN: 5.6 g/dL — AB (ref 6.0–8.3)
Total Bilirubin: 1.3 mg/dL — ABNORMAL HIGH (ref 0.3–1.2)

## 2014-01-25 LAB — CBC WITH DIFFERENTIAL/PLATELET
BASOS ABS: 0 10*3/uL (ref 0.0–0.1)
Basophils Relative: 0 % (ref 0–1)
EOS ABS: 0.2 10*3/uL (ref 0.0–0.7)
Eosinophils Relative: 1 % (ref 0–5)
HCT: 22.4 % — ABNORMAL LOW (ref 36.0–46.0)
Hemoglobin: 8 g/dL — ABNORMAL LOW (ref 12.0–15.0)
LYMPHS PCT: 19 % (ref 12–46)
Lymphs Abs: 3.2 10*3/uL (ref 0.7–4.0)
MCH: 40.6 pg — ABNORMAL HIGH (ref 26.0–34.0)
MCHC: 35.7 g/dL (ref 30.0–36.0)
MCV: 113.7 fL — ABNORMAL HIGH (ref 78.0–100.0)
Monocytes Absolute: 2.7 10*3/uL — ABNORMAL HIGH (ref 0.1–1.0)
Monocytes Relative: 16 % — ABNORMAL HIGH (ref 3–12)
NEUTROS PCT: 64 % (ref 43–77)
Neutro Abs: 10.5 10*3/uL — ABNORMAL HIGH (ref 1.7–7.7)
PLATELETS: 161 10*3/uL (ref 150–400)
RBC: 1.97 MIL/uL — ABNORMAL LOW (ref 3.87–5.11)
RDW: 16.1 % — AB (ref 11.5–15.5)
WBC: 16.6 10*3/uL — ABNORMAL HIGH (ref 4.0–10.5)

## 2014-01-25 LAB — AMMONIA: AMMONIA: 60 umol/L (ref 11–60)

## 2014-01-25 MED ORDER — CARVEDILOL 6.25 MG PO TABS
6.2500 mg | ORAL_TABLET | Freq: Two times a day (BID) | ORAL | Status: DC
  Administered 2014-01-25 – 2014-01-26 (×3): 6.25 mg via ORAL
  Filled 2014-01-25 (×5): qty 1

## 2014-01-25 MED ORDER — CYCLOSPORINE 0.05 % OP EMUL
1.0000 [drp] | Freq: Two times a day (BID) | OPHTHALMIC | Status: DC
  Administered 2014-01-25 (×2): 1 [drp] via OPHTHALMIC
  Filled 2014-01-25 (×4): qty 1

## 2014-01-25 MED ORDER — ONDANSETRON 4 MG PO TBDP
4.0000 mg | ORAL_TABLET | Freq: Three times a day (TID) | ORAL | Status: DC | PRN
  Administered 2014-01-25: 4 mg via ORAL
  Filled 2014-01-25: qty 1

## 2014-01-25 MED ORDER — RIFAXIMIN 550 MG PO TABS
550.0000 mg | ORAL_TABLET | Freq: Two times a day (BID) | ORAL | Status: DC
  Administered 2014-01-25 (×2): 550 mg via ORAL
  Filled 2014-01-25 (×4): qty 1

## 2014-01-25 MED ORDER — PNEUMOCOCCAL VAC POLYVALENT 25 MCG/0.5ML IJ INJ
0.5000 mL | INJECTION | INTRAMUSCULAR | Status: AC
  Administered 2014-01-26: 0.5 mL via INTRAMUSCULAR
  Filled 2014-01-25: qty 0.5

## 2014-01-25 MED ORDER — SODIUM CHLORIDE 0.9 % IV SOLN
INTRAVENOUS | Status: DC
  Administered 2014-01-25 (×3): via INTRAVENOUS
  Administered 2014-01-26: 125 mL/h via INTRAVENOUS

## 2014-01-25 MED ORDER — LACTULOSE 10 GM/15ML PO SOLN
30.0000 g | Freq: Three times a day (TID) | ORAL | Status: DC
  Administered 2014-01-25 – 2014-01-26 (×4): 30 g via ORAL
  Filled 2014-01-25 (×6): qty 45

## 2014-01-25 MED ORDER — LORATADINE 10 MG PO TABS
10.0000 mg | ORAL_TABLET | Freq: Every day | ORAL | Status: DC
  Administered 2014-01-25 – 2014-01-26 (×2): 10 mg via ORAL
  Filled 2014-01-25 (×2): qty 1

## 2014-01-25 MED ORDER — DOCUSATE SODIUM 100 MG PO CAPS
100.0000 mg | ORAL_CAPSULE | Freq: Two times a day (BID) | ORAL | Status: DC
  Administered 2014-01-25 (×2): 100 mg via ORAL
  Filled 2014-01-25 (×4): qty 1

## 2014-01-25 MED ORDER — CLOTRIMAZOLE 1 % EX CREA
TOPICAL_CREAM | Freq: Two times a day (BID) | CUTANEOUS | Status: DC
  Administered 2014-01-25: 12:00:00 via TOPICAL
  Administered 2014-01-25: 1 via TOPICAL
  Filled 2014-01-25 (×2): qty 15

## 2014-01-25 MED ORDER — QUETIAPINE FUMARATE 200 MG PO TABS
200.0000 mg | ORAL_TABLET | Freq: Every day | ORAL | Status: DC
  Administered 2014-01-25 (×2): 200 mg via ORAL
  Filled 2014-01-25 (×3): qty 1

## 2014-01-25 MED ORDER — OLOPATADINE HCL 0.1 % OP SOLN
1.0000 [drp] | Freq: Two times a day (BID) | OPHTHALMIC | Status: DC
  Administered 2014-01-25 – 2014-01-26 (×3): 1 [drp] via OPHTHALMIC
  Filled 2014-01-25 (×2): qty 5

## 2014-01-25 MED ORDER — FUROSEMIDE 20 MG PO TABS
20.0000 mg | ORAL_TABLET | Freq: Every day | ORAL | Status: DC
  Administered 2014-01-25 – 2014-01-26 (×2): 20 mg via ORAL
  Filled 2014-01-25 (×2): qty 1

## 2014-01-25 MED ORDER — POLYETHYLENE GLYCOL 3350 17 G PO PACK
17.0000 g | PACK | Freq: Two times a day (BID) | ORAL | Status: DC
  Administered 2014-01-25 (×2): 17 g via ORAL
  Filled 2014-01-25 (×4): qty 1

## 2014-01-25 MED ORDER — BIOTENE DRY MOUTH MT LIQD
15.0000 mL | Freq: Two times a day (BID) | OROMUCOSAL | Status: DC
  Administered 2014-01-25 (×2): 15 mL via OROMUCOSAL

## 2014-01-25 MED ORDER — PANTOPRAZOLE SODIUM 40 MG PO TBEC
40.0000 mg | DELAYED_RELEASE_TABLET | Freq: Every day | ORAL | Status: DC
  Administered 2014-01-25 – 2014-01-26 (×2): 40 mg via ORAL
  Filled 2014-01-25 (×2): qty 1

## 2014-01-25 MED ORDER — CLONAZEPAM 1 MG PO TABS
1.0000 mg | ORAL_TABLET | Freq: Two times a day (BID) | ORAL | Status: DC | PRN
  Administered 2014-01-25: 1 mg via ORAL
  Filled 2014-01-25: qty 1

## 2014-01-25 MED ORDER — SPIRONOLACTONE 100 MG PO TABS
100.0000 mg | ORAL_TABLET | Freq: Every day | ORAL | Status: DC
  Administered 2014-01-25 – 2014-01-26 (×2): 100 mg via ORAL
  Filled 2014-01-25 (×2): qty 1

## 2014-01-25 MED ORDER — HEPARIN SODIUM (PORCINE) 5000 UNIT/ML IJ SOLN
5000.0000 [IU] | Freq: Three times a day (TID) | INTRAMUSCULAR | Status: DC
  Administered 2014-01-25 – 2014-01-26 (×3): 5000 [IU] via SUBCUTANEOUS
  Filled 2014-01-25 (×6): qty 1

## 2014-01-25 NOTE — H&P (Signed)
[facility] Admission History and Physical Service Pager: [phone]  Patient name: Wendy [name] Medical record number: [id number] Date of birth: 10/05/1960 Age: 53 y.o. Gender: female  Primary Care Provider: Liam Graham, MD Consultants: GI Code Status: Full  Chief Complaint: Weakness and falls at home  Assessment and Plan: [name] is a 53 y.o. female presenting with leg weakness/falls found to have severe anemia and hyperammonemia. PMH is significant for hepatitis C with ascites (treated at [facility]), HTN, anxiety, osteoporosis  # Hepatitis C - With cirrhosis and ascites. Treated at [facility]. Patient currently with constipation and reports not taking lactulose at home. Ammonia was 87. She is not currently encephalopathic but this could certain contribute to her weakness and near syncope.  - Admit to med-surg, admitting Dr. Lindell Noe - Con't Rifaximin  - Con't Spironolactone - Schedule Lactulose TID until consistent bowel movements - Miralax BID prn - Colace prn - Can consider CT abdomen if needed to evaluate ascites and/or liver, but not ordered at this time since her abdomen is soft and non-tender - Currently denies alcohol use. She has a history of alcohol use, and I have a low threshold for starting her on CIWA - Repeat ammonia in the AM. Hopefully patient will have bowel movement by then.  # Anemia- Patient's hemoglobin is currently 8.0 (7.4 in clinic today) down from 13.2 two weeks ago. Hemoccult positive. Patient denies frankly bloody stools since she is endorsing constipation. No vomiting. Does report vague abdominal pain. - 1 unit PRBC started in the ER. Will get post-transfusion CBC with AM labs - PPI for possible upper GI bleed - GI was consulted when patient was in the ER and will see patient tomorrow morning - Monitor for any signs of bleeding - Will keep on clears for now since patient is requesting to eat, but consider making NPO in the  morning in case GI needs to do a scope  # AKI- Creatinine 2.49. Baseline is around 1.0. Patient states she has taken some ibuprofen for headaches, but also reports decreased PO intake. AKI most likely due to dehydration - She has received 2 boluses in the ED - Con't fluids at 125 cc/hr - Recheck with morning labs  # HTN- History of HTN. Blood pressures currently 119/70 - Con't home Coreg - Monitor vitals per floor - Orthostatics on admission  # Anxiety- History of anxiety with agoraphobia - Con't Klonopin prn - Con't Seroquel  # Osteoporosis- Patient with prior hip fracture, now with falls - Fosamax held while in the hospital - PT/OT evaluation  # Allergies- Stable - Con't Claritin - Con't eye drops  # Tobacco use- - Nicotine patches not ordered, but can be on request  FEN/GI: Clears, NS @ 125 cc/hr. Protonix Prophylaxis: Heparin SQ  Disposition: Admit to med-surg. Monitor for any signs of bleeding.   History of Present Illness: Wendy [name] is a 53 y.o. female presenting with weakness and falls at home. Patient was evaluated at the [facility] today and labs were obtained. Labs resulted this evening with an ammonia of 87 and hemoglobin of 7.4. Patient was instructed to go to the ER for further evaluation.   Patient states at home she had some near syncope episodes when she would feel weak/lightheaded and have to rest. She has a diffuse weakness without any focal findings. She did not have any major falls, and denies any head injury. Her biggest concern at this time is constipation. She has not  had a bowel movement in many days. She has lactulose, miralax and glycerine to use at home but she takes these very inconsistently. She denies any blood in her stool with last bowel movement. She denies any vomiting. She has not been eating well at home or drinking fluids.   Review Of Systems: Per HPI with the following additions: endorses headaches and some shortness of  breath. Otherwise 12 point review of systems was performed and was unremarkable.  Patient Active Problem List   Diagnosis Date Noted  . Fall at home 01/24/2014  . Near syncope 01/12/2014  . Loss of weight 01/12/2014  . Unspecified constipation 11/18/2013  . Compression fracture 09/21/2013  . Atypical chest pain 07/26/2013  . Ascites 07/19/2013  . Burn 07/03/2013  . Dyspnea 02/03/2013  . Abnormal stress echo 01/19/2013  . Tobacco abuse 10/22/2012  . Preoperative clearance 09/27/2012  . Allergic conjunctivitis 06/19/2012  . Skin lesion 06/19/2012  . Lumbar spondylosis 12/24/2011  . Obesity (BMI 35.0-39.9 without comorbidity) 12/08/2011  . Right hip pain 12/01/2011  . Chronic hepatitis C 10/06/2011  . GERD (gastroesophageal reflux disease) 10/06/2011  . Hepatic encephalopathy syndrome 07/28/2011  . Back pain 04/07/2011  . 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 THROMBOCYTOPENIA 10/21/2006  . DEPRESSIVE DISORDER, NOS 10/21/2006  . HYPERTENSION, BENIGN SYSTEMIC 10/21/2006  . HEPATIC CIRRHOSIS WITH HCV AND HX OF ALCOHOL 10/21/2006   Past Medical History: Past Medical History  Diagnosis Date  . Cirrhosis   . Hiatal hernia   . Esophageal stricture   . Anxiety disorder   . 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 Hepatitis C, chronic   . History of alcohol abuse   . Depression   . History of hip fracture   . Allergic rhinitis   . HTN (hypertension)   . Anxiety   . Blood transfusion   . Cataract     MILD  . GERD (gastroesophageal reflux disease)   . Osteoporosis   . Ulcer   . Arthritis   . Blood transfusion without reported diagnosis 2010 & 1976  . Clotting disorder     prolonged clotting time due to liver disease   Past Surgical History: Past Surgical History  Procedure Laterality Date  . Orif hip fracture  2010    right x2  . Upper gastrointestinal endoscopy  08/05/2007    esophageal ring, hiatal hernia, portal gastropathy  .  Splenectomy      age 65  . Burr hole for subdural hematoma    . Upper gastrointestinal endoscopy  10/14/2011  . Colonoscopy    . Kyphoplasty Bilateral 09/21/2013    Procedure: T12 - L1 KYPHOPLASTY;  Surgeon: Melina Schools, MD;  Location: [place];  Service: Orthopedics;  Laterality: Bilateral;   Social History: History  Substance Use Topics  . Smoking status: Current Some Day Smoker -- 0.25 packs/day for 10 years    Types: Cigarettes    Last Attempt to Quit: 12/23/2008  . Smokeless tobacco: Never Used  . Alcohol Use: No     Comment: goes to AA   Please also refer to relevant sections of EMR.  Family History: Family History  Problem Relation Age of Onset  . Heart disease Mother   . Colon cancer Neg Hx   . Other Daughter     chromosome abnormalit  . Other Daughter     micropthalmia   Allergies and Medications: Allergies  Allergen Reactions  . Codeine Phosphate Itching    REACTION: unspecified  No current facility-administered medications on file prior to encounter.   Current Outpatient Prescriptions on File Prior to Encounter  Medication Sig Dispense Refill  . alendronate (FOSAMAX) 70 MG tablet Take 1 tablet (70 mg total) by mouth every Thursday. Take with a full glass of water on an empty stomach.  60 tablet  6  . calcium-vitamin D (OSCAL-500) 500-400 MG-UNIT per tablet 2 tablets 2 (two) times daily.       . carvedilol (COREG) 6.25 MG tablet Take 1 tablet (6.25 mg total) by mouth 2 (two) times daily with a meal.  60 tablet  3  . clonazePAM (KLONOPIN) 1 MG tablet Take 1 tablet (1 mg total) by mouth 3 (three) times daily as needed for anxiety.  90 tablet  0  . cyclobenzaprine (FLEXERIL) 10 MG tablet Take 1 tablet (10 mg total) by mouth 3 (three) times daily as needed for muscle spasms.  30 tablet  3  . cycloSPORINE (RESTASIS) 0.05 % ophthalmic emulsion Place 1 drop into both eyes 2 (two) times daily.  0.4 mL  2  . docusate sodium (COLACE) 100 MG capsule Take 1 capsule (100 mg  total) by mouth 2 (two) times daily.  50 capsule  0  . furosemide (LASIX) 40 MG tablet Take 0.5 tablets (20 mg total) by mouth daily.  15 tablet  0  . glycerin adult (GLYCERIN ADULT) 2 G SUPP Place 1 suppository rectally daily as needed for moderate constipation.  10 each  0  . hydrOXYzine (ATARAX/VISTARIL) 25 MG tablet Take 25 mg by mouth 4 (four) times daily as needed for itching.      . lactulose (CHRONULAC) 10 GM/15ML solution Take 45 mLs (30 g total) by mouth 4 (four) times daily as needed (for constipation).  960 mL  6  . loratadine (CLARITIN) 10 MG tablet Take 1 tablet (10 mg total) by mouth daily.  30 tablet  11  . Olopatadine HCl 0.2 % SOLN Place 1 drop into both eyes daily.      Marland Kitchen omeprazole (PRILOSEC) 40 MG capsule Take 1 capsule (40 mg total) by mouth daily.  30 capsule  5  . ondansetron (ZOFRAN ODT) 4 MG disintegrating tablet Take 1 tablet (4 mg total) by mouth every 8 (eight) hours as needed for nausea or vomiting.  50 tablet  0  . polyethylene glycol (MIRALAX / GLYCOLAX) packet Take 17 g by mouth daily as needed for mild constipation.      . Prenatal 28-0.8 MG TABS Take 1 tablet by mouth daily.      . QUEtiapine (SEROQUEL) 200 MG tablet Take 200 mg by mouth at bedtime.      . rifaximin (XIFAXAN) 550 MG TABS tablet Take 550 mg by mouth 2 (two) times daily.      Marland Kitchen spironolactone (ALDACTONE) 100 MG tablet Take 100 mg by mouth daily.        Objective: BP 127/69  Pulse 90  Temp(Src) 97.6 F (36.4 C) (Oral)  Resp 17  Ht 5\' 2"  (1.575 m)  Wt 190 lb (86.183 kg)  BMI 34.74 kg/m2  SpO2 100% Exam: General: White female lying on stretcher, knees bent. NAD HEENT: Dry mucus membranes. Cardiovascular: RRR, no murmur appreciated Respiratory: Good effort, CTA. No crackles or wheezes Abdomen: Somewhat distended but soft, nontender. No rebound or guarding Extremities: Moves all extremities. No edema Skin: Pale Neuro: Awake, alert, oriented to person, place, time, situation. CN grossly  intact. Did not ambulate patient at time of exam since nursing  staff was starting IV for blood transfusion. Will reassess.  Labs and Imaging: CBC BMET   Recent Labs Lab 01/24/14 2256  WBC 16.6*  HGB 8.0*  HCT 22.4*  PLT 161    Recent Labs Lab 01/24/14 2256  NA 138  K 4.4  CL 106  CO2 20  BUN 87*  CREATININE 2.21*  GLUCOSE 118*  CALCIUM 9.8     Ammonia 87  Meredeth Furber Fidel Levy, MD 01/25/2014, 12:48 AM PGY-3, [facility] pager: [phone], text pages welcome

## 2014-01-25 NOTE — H&P (Signed)
[facility] Attending Admit Note  Patient seen and examined by me, discussed with resident team and I agree with Dr Darnelle Going admission note. Patient with HCV and liver cirrhosis, history EtOH with sobriety date in Nov 2006, presents to ED after office visit yesterday for increasing spells of falls and questionable syncope.  Patient had labs drawn showing an ammonia level in the 80s and a Hgb 8.0 (baseline 13.2 two weeks ago).  She has been able to answer questions and has been alert throughout her presentation on this admission.  She complains of constipation, no BM in four days.  Started on lactulose and still has not had a BM.  Denies fevers or chills.  Had been followed by Dr Orson Slick from [facility] at office in [facility] for her HCV disease, last seen there over a year ago. Patient has been hemoccult positive on admission.  She received one unit PRBC in the ED, Hgb this morning 8.6.  Concern for portal-hypertension-related variceal bleed, coupled with elevated ammonia level as cause of her weakness and falls.  For GI consult.  She tells me that Dr Carlean Purl is her gastroenterologist, although she has not seen him recently.  Serial H/H, exams. Dalbert Mayotte, MD

## 2014-01-25 NOTE — Discharge Summary (Signed)
[facility] Discharge Summary  Patient name: Wendy [name] Medical record number: [id number] Date of birth: Jan 21, 1961 Age: 53 y.o. Gender: female Date of Admission: 01/24/2014  Date of Discharge: 01/26/14 Admitting Physician: Willeen Niece, MD  Primary Care Provider: Liam Graham, MD Consultants: GI  Indication for Hospitalization: weakness, hemoccult+ GI bleed, hyperammonemia  Discharge Diagnoses/Problem List:  Acute anemia, suspected secondary to UGI bleed in setting of cirrhosis - Improved s/p 1u PRBC Cirrhosis with ascites, secondary to Hep C and prior EtOH abuse Hyperammonemia - Resolved AKI HTN Anxiety Left inguinal groin rash Osteoporosis Tobacco Abuse  Disposition: Home  Discharge Condition: Stable  Discharge Exam: General: laying in bed, comfortable, NAD  Cardiovascular: RRR, no murmur appreciated  Respiratory: CTAB, nml effort  Abdomen: Stable distended but soft, nontender. No rebound or guarding  Extremities: Moves all extremities. No edema Skin: mild erythematous generalized rash within left inguinal skin fold, no lesions suspicious of fungal infection, no open sores, no warmth or tenderness Neuro: Awake, alert, oriented. Grossly non-focal  Brief Hospital Course:  KIMBRIA CAMPOSANO is a 53 y.o. female who presented with leg weakness/falls found to have severe anemia (acute) and hyperammonemia. PMH is significant for hepatitis C with ascites (treated at [facility]), HTN, anxiety, osteoporosis. Recent office visit due to falls / pre-syncope, later found to have elevated ammonia 89 and acute anemia to Hgb 8.0 (previously 13.2 2 weeks ago). Patient called back from clinic, advised to go to ED. Initial work-up with patient found to have constipation x 4 days with poor compliance on lactulose / bowel regimen, afebrile, no abdominal pain, stable abd distention, otherwise vitals stable, work-up significant for hemoccult (positive), treated with 1u  PRBC, Protonix IV in ED, since stabilized Hgb. No significant history of hematemesis, questionable melena. Consulted GI for further eval and EGD, with concern for potential portal HTN / variceal bleed, suspected falls / pre-syncope attributed to acute blood loss anemia and hyperammonemia.  During hospitalization, patient remained overall stable, eventually had large BM on lactulose and bowel regimen, ammonia trended down to < 40. Hgb remained stable and with mild improvement, to 8.5. Additionally, had AKI on admission, improved with IVF rehydration. GI proceeded with EGD on 6/5, pending biopsy results (see details below), no active bleeding identified (Grade 1 esophageal varices), continue PPI, may need further work-up for esophageal stricture (barium esophagram). Patient cleared for discharge to home following EGD.  Issues for Follow Up:   1. Monitor Hgb / repeat bleeding - Re-check CBC, eval for resolution of bleeding  2. EGD follow-up - Biopsy results, may need barium esophagram to eval esophagus, and possibly esophageal manometry, to be arranged by GI outpatient.  3. Lactulose / Bowel regimen  4. Left inguinal rash - Follow-up resolution  Significant Procedures: 1. EGD - 01/26/14 - Dr. Olevia Perches  Significant Labs and Imaging:   Recent Labs Lab 01/24/14 2256 01/25/14 0746 01/26/14 0605  WBC 16.6* 13.6* 12.7*  HGB 8.0* 8.6* 8.5*  HCT 22.4* 24.1* 24.2*  PLT 161 146* 162    Recent Labs Lab 01/24/14 1451 01/24/14 2256 01/25/14 0510 01/26/14 0605  NA 140 138 142 141  K 4.1 4.4 3.7 4.4  CL 103 106 107 113*  CO2 22 20 20  16*  GLUCOSE 187* 118* 105* 83  BUN 95* 87* 79* 48*  CREATININE 2.49* 2.21* 1.82* 1.15*  CALCIUM 9.3 9.8 9.1 7.9*  ALKPHOS 134* 138* 112 86  AST 55* 58* 50* 52*  ALT 36* 35 32 29  ALBUMIN 2.3* 2.4* 2.1* 1.9*   Ammonia 87 >> 49  6/5 EGD ENDOSCOPIC IMPRESSION:  Tortuous esophagus consistent with presbyesophagus  Hypertensive lower esophageal sphincter rule  out achalasia. Status  post passage of a 15,16 and 17 mm dilators  Question of Grade 1 esophageal varices  Mild antral gastritis consistent with portal hypertensive  gastropathy  4 cm hiatal hernia which is nonreducible  Status post biopsies from gastric antrum and from the descending  duodenum  Results/Tests Pending at Time of Discharge:   1. EGD biopsies - pending  Discharge Medications:    Medication List         alendronate 70 MG tablet  Commonly known as:  FOSAMAX  Take 1 tablet (70 mg total) by mouth every Thursday. Take with a full glass of water on an empty stomach.     calcium-vitamin D 500-400 MG-UNIT per tablet  Commonly known as:  OSCAL-500  2 tablets 2 (two) times daily.     carvedilol 6.25 MG tablet  Commonly known as:  COREG  Take 1 tablet (6.25 mg total) by mouth 2 (two) times daily with a meal.     clonazePAM 1 MG tablet  Commonly known as:  KLONOPIN  Take 1 tablet (1 mg total) by mouth 3 (three) times daily as needed for anxiety.     cyclobenzaprine 10 MG tablet  Commonly known as:  FLEXERIL  Take 1 tablet (10 mg total) by mouth 3 (three) times daily as needed for muscle spasms.     cycloSPORINE 0.05 % ophthalmic emulsion  Commonly known as:  RESTASIS  Place 1 drop into both eyes 2 (two) times daily.     docusate sodium 100 MG capsule  Commonly known as:  COLACE  Take 1 capsule (100 mg total) by mouth 2 (two) times daily.     furosemide 40 MG tablet  Commonly known as:  LASIX  Take 0.5 tablets (20 mg total) by mouth daily.     glycerin adult 2 G Supp  Commonly known as:  glycerin adult  Place 1 suppository rectally daily as needed for moderate constipation.     hydrOXYzine 25 MG tablet  Commonly known as:  ATARAX/VISTARIL  Take 25 mg by mouth 4 (four) times daily as needed for itching.     lactulose 10 GM/15ML solution  Commonly known as:  CHRONULAC  Take 45 mLs (30 g total) by mouth 4 (four) times daily as needed (for constipation).      loratadine 10 MG tablet  Commonly known as:  CLARITIN  Take 1 tablet (10 mg total) by mouth daily.     Olopatadine HCl 0.2 % Soln  Place 1 drop into both eyes daily.     omeprazole 40 MG capsule  Commonly known as:  PRILOSEC  Take 1 capsule (40 mg total) by mouth daily.     ondansetron 4 MG disintegrating tablet  Commonly known as:  ZOFRAN ODT  Take 1 tablet (4 mg total) by mouth every 8 (eight) hours as needed for nausea or vomiting.     polyethylene glycol packet  Commonly known as:  MIRALAX / GLYCOLAX  Take 17 g by mouth daily as needed for mild constipation.     PRENATAL 28-0.8 MG Tabs  Take 1 tablet by mouth daily.     QUEtiapine 200 MG tablet  Commonly known as:  SEROQUEL  Take 200 mg by mouth at bedtime.     rifaximin 550 MG Tabs tablet  Commonly known as:  XIFAXAN  Take 550 mg by mouth 2 (two) times daily.     spironolactone 100 MG tablet  Commonly known as:  ALDACTONE  Take 100 mg by mouth daily.        Discharge Instructions: Please refer to Patient Instructions section of EMR for full details.  Patient was counseled important signs and symptoms that should prompt return to medical care, changes in medications, dietary instructions, activity restrictions, and follow up appointments.   Follow-Up Appointments: Follow-up Information   Follow up with Christa See, MD On 02/01/2014. (at 3:15pm for hospital follow-up appointment)    Specialty:  Family Medicine   Contact information:   [place] [address] [place], DO 01/27/2014, 12:07 PM PGY-1, [place]

## 2014-01-25 NOTE — Progress Notes (Signed)
UR completed 

## 2014-01-25 NOTE — Plan of Care (Signed)
Problem: Phase I Progression Outcomes Goal: Hemodynamically stable Outcome: Progressing Patient admitted to floor from [facility] ED for further management of increased ammonia levels and dehydration.  She is alert and oriented x4.  Arrived via stretcher with one unit of PRBCs infusing.  No reactions observed.  Oriented patient to room and floor procedures.  Discussed plan of care.  Patient voices understanding.  Resting quietly at present.  Will continue to monitor.

## 2014-01-25 NOTE — Evaluation (Signed)
Physical Therapy Evaluation Patient Details Name: Wendy [name] MRN: [id number] DOB: [birth year] Today's Date: 01/25/2014   History of Present Illness  53 year old female with depression, high blood pressure, hepatic cirrhosis, hepatitis C, alcohol abuse, reflux, encephalopathy history presents with gradually worsening general fatigue and weakness the past 2 weeks and abnormal labs. Patient had labs done earlier today with hemoglobin and renal function worse than normal. Patient does not regularly take her lactulose but did have one dose earlier today. Patient has had multiple episodes of near syncope with standing and feels dehydrated. Patient denies chest pain or shortness of breath or head injury. Patient feels she always catches herself or slowly lower she self down the ground.   Clinical Impression  Pt agreeable to PT evaluation.  She was limited by dizziness in standing while attempting to take orthostatic vitals.  Machine malfunction with BP in standing and unable to get reading.  Pt would benefit from PT while in acute care in order for pt to return home at prior level of function.    Follow Up Recommendations No PT follow up (if pt does not progress well, consider HHPT)    Equipment Recommendations  None recommended by PT    Recommendations for Other Services       Precautions / Restrictions Precautions Precautions: Fall Precaution Comments: Pt with numerous falls the last 2 weeks.      Mobility  Bed Mobility Overal bed mobility: Needs Assistance Bed Mobility: Supine to Sit;Sit to Supine     Supine to sit: Min assist Sit to supine: Supervision   General bed mobility comments: Pt needed tp pull up on PT's hand for leverage.  Decreased problem solving on how to transition from supine to sit.  Transfers Overall transfer level: Needs assistance   Transfers: Sit to/from Stand Sit to Stand: Min guard         General transfer comment: Cueing for hand placement.   Attempted BP in standing, but machine unable to get reading before pt had to sit down due to dizziness.  Pt with low BP in standing earlier with nursing.  Ambulation/Gait             General Gait Details: Unable to ambulate due to dizziness  Stairs            Wheelchair Mobility    Modified Rankin (Stroke Patients Only)       Balance Overall balance assessment: Needs assistance Sitting-balance support: No upper extremity supported Sitting balance-Leahy Scale: Good     Standing balance support: Single extremity supported Standing balance-Leahy Scale: Fair                               Pertinent Vitals/Pain Denies pain.  BP 103/70 in supine, 118/86 in sitting and unable to get reading in standing, but it had been extremely low early this AM with nursing when they did orthostatics.    Home Living Family/patient expects to be discharged to:: Private residence Living Arrangements: Non-relatives/Friends   Type of Home: House Home Access: Level entry     Home Layout: One [place]: Environmental consultant - 2 wheels;Cane - single point;Bedside commode;Shower seat      Prior Function Level of Independence: Independent;Independent with assistive device(s)         Comments: occcasional use of RW. Pt unable to give clear picture of prior level.     Hand Dominance   Dominant Hand: Right  Extremity/Trunk Assessment   Upper Extremity Assessment: Defer to OT evaluation           Lower Extremity Assessment: Overall WFL for tasks assessed      Cervical / Trunk Assessment: Normal  Communication   Communication: No difficulties  Cognition Arousal/Alertness: Awake/alert Behavior During Therapy: WFL for tasks assessed/performed Overall Cognitive Status: Impaired/Different from baseline Area of Impairment: Problem solving     Memory: Decreased short-term memory         General Comments: Pt oriented but "foggy".  pt aware she has having  short term memory issues and that she is not as clear as before.    General Comments General comments (skin integrity, edema, etc.): Pt with dizziness impacting balance in standing.    Exercises        Assessment/Plan    PT Assessment Patient needs continued PT services  PT Diagnosis Difficulty walking   PT Problem List Decreased activity tolerance;Decreased balance;Decreased mobility  PT Treatment Interventions Gait training;Functional mobility training;Therapeutic exercise;Therapeutic activities   PT Goals (Current goals can be found in the Care Plan section) Acute Rehab PT Goals Patient Stated Goal: To not fall anymore. PT Goal Formulation: With patient Time For Goal Achievement: 02/01/14 Potential to Achieve Goals: Good    Frequency Min 3X/week   Barriers to discharge        Co-evaluation               End of Session Equipment Utilized During Treatment: Gait belt Activity Tolerance: Treatment limited secondary to medical complications (Comment) Patient left: in bed Nurse Communication: Mobility status    Functional Limitation: Changing and maintaining body position Changing and Maintaining Body Position Current Status (G9924): At least 1 percent but less than 20 percent impaired, limited or restricted Changing and Maintaining Body Position Goal Status (Q6834): 0 percent impaired, limited or restricted    Time: 0842-0915 PT Time Calculation (min): 33 min   Charges:   PT Evaluation $Initial PT Evaluation Tier I: 1 Procedure PT Treatments $Therapeutic Activity: 8-22 mins   PT G Codes:     Functional Limitation: Changing and maintaining body position    Galen Manila 01/25/2014, 9:31 AM

## 2014-01-26 ENCOUNTER — Ambulatory Visit: Payer: Medicare Other | Admitting: Family Medicine

## 2014-01-26 ENCOUNTER — Encounter (HOSPITAL_COMMUNITY): Payer: Self-pay | Admitting: Physician Assistant

## 2014-01-26 ENCOUNTER — Encounter (HOSPITAL_COMMUNITY): Payer: Medicare Other | Admitting: Anesthesiology

## 2014-01-26 ENCOUNTER — Encounter (HOSPITAL_COMMUNITY)
Admission: EM | Disposition: A | Discharge status: Home / Self Care | Payer: Self-pay | Source: Home / Self Care | Attending: Family Medicine

## 2014-01-26 ENCOUNTER — Inpatient Hospital Stay (HOSPITAL_COMMUNITY): Payer: Medicare Other | Admitting: Anesthesiology

## 2014-01-26 DIAGNOSIS — K7460 Unspecified cirrhosis of liver: Principal | ICD-10-CM

## 2014-01-26 DIAGNOSIS — K222 Esophageal obstruction: Secondary | ICD-10-CM

## 2014-01-26 DIAGNOSIS — K922 Gastrointestinal hemorrhage, unspecified: Secondary | ICD-10-CM

## 2014-01-26 DIAGNOSIS — K219 Gastro-esophageal reflux disease without esophagitis: Secondary | ICD-10-CM

## 2014-01-26 HISTORY — PX: ESOPHAGOGASTRODUODENOSCOPY (EGD) WITH PROPOFOL: SHX5813

## 2014-01-26 LAB — COMPREHENSIVE METABOLIC PANEL
ALBUMIN: 1.9 g/dL — AB (ref 3.5–5.2)
ALT: 29 U/L (ref 0–35)
AST: 52 U/L — AB (ref 0–37)
Alkaline Phosphatase: 86 U/L (ref 39–117)
BILIRUBIN TOTAL: 1 mg/dL (ref 0.3–1.2)
BUN: 48 mg/dL — ABNORMAL HIGH (ref 6–23)
CO2: 16 mEq/L — ABNORMAL LOW (ref 19–32)
Calcium: 7.9 mg/dL — ABNORMAL LOW (ref 8.4–10.5)
Chloride: 113 mEq/L — ABNORMAL HIGH (ref 96–112)
Creatinine, Ser: 1.15 mg/dL — ABNORMAL HIGH (ref 0.50–1.10)
GFR calc Af Amer: 63 mL/min — ABNORMAL LOW (ref >90)
GFR calc non Af Amer: 54 mL/min — ABNORMAL LOW (ref >90)
Glucose, Bld: 83 mg/dL (ref 70–99)
Potassium: 4.4 mEq/L (ref 3.7–5.3)
Sodium: 141 mEq/L (ref 137–147)
Total Protein: 5.2 g/dL — ABNORMAL LOW (ref 6.0–8.3)

## 2014-01-26 LAB — CBC
HCT: 24.2 % — ABNORMAL LOW (ref 36.0–46.0)
Hemoglobin: 8.5 g/dL — ABNORMAL LOW (ref 12.0–15.0)
MCH: 36.6 pg — ABNORMAL HIGH (ref 26.0–34.0)
MCHC: 35.1 g/dL (ref 30.0–36.0)
MCV: 104.3 fL — ABNORMAL HIGH (ref 78.0–100.0)
Platelets: 162 10*3/uL (ref 150–400)
RBC: 2.32 MIL/uL — ABNORMAL LOW (ref 3.87–5.11)
WBC: 12.7 10*3/uL — ABNORMAL HIGH (ref 4.0–10.5)

## 2014-01-26 LAB — AMMONIA: Ammonia: 49 umol/L (ref 11–60)

## 2014-01-26 SURGERY — EGD (ESOPHAGOGASTRODUODENOSCOPY)
Anesthesia: Moderate Sedation

## 2014-01-26 SURGERY — ESOPHAGOGASTRODUODENOSCOPY (EGD) WITH PROPOFOL
Anesthesia: Monitor Anesthesia Care

## 2014-01-26 MED ORDER — PROPOFOL 10 MG/ML IV BOLUS
INTRAVENOUS | Status: DC | PRN
  Administered 2014-01-26: 30 mg via INTRAVENOUS
  Administered 2014-01-26 (×2): 20 mg via INTRAVENOUS

## 2014-01-26 MED ORDER — BARRIER CREAM NON-SPECIFIED
1.0000 "application " | TOPICAL_CREAM | Freq: Two times a day (BID) | TOPICAL | Status: DC
  Filled 2014-01-26 (×2): qty 1

## 2014-01-26 MED ORDER — MIDAZOLAM HCL 5 MG/5ML IJ SOLN
INTRAMUSCULAR | Status: DC | PRN
  Administered 2014-01-26: 2 mg via INTRAVENOUS

## 2014-01-26 MED ORDER — LIDOCAINE HCL (CARDIAC) 20 MG/ML IV SOLN
INTRAVENOUS | Status: DC | PRN
  Administered 2014-01-26: 60 mg via INTRAVENOUS

## 2014-01-26 MED ORDER — BUTAMBEN-TETRACAINE-BENZOCAINE 2-2-14 % EX AERO
INHALATION_SPRAY | CUTANEOUS | Status: DC | PRN
  Administered 2014-01-26: 2 via TOPICAL

## 2014-01-26 MED ORDER — LACTATED RINGERS IV SOLN
INTRAVENOUS | Status: DC | PRN
  Administered 2014-01-26: 14:00:00 via INTRAVENOUS

## 2014-01-26 MED ORDER — FENTANYL CITRATE 0.05 MG/ML IJ SOLN
INTRAMUSCULAR | Status: DC | PRN
  Administered 2014-01-26: 100 ug via INTRAVENOUS
  Administered 2014-01-26: 50 ug via INTRAVENOUS

## 2014-01-26 MED ORDER — SODIUM CHLORIDE 0.9 % IV SOLN: INTRAVENOUS | Status: DC

## 2014-01-26 NOTE — Discharge Instructions (Signed)
It is very important to continue to take your Lactulose and other stool softeners / laxatives. If these medications aren't taken regularly, then your Ammonia level can rise and cause you to become altered, confused and sick.  If your abdomen continues to get more distended, painful, or you develop fever, we would recommend contacting your GI doctor's office and arrange to have a Paracentesis (drain of abdominal fluid) at their office or through the Emergency Department.  Bloody Stools Bloody stools often mean that there is a problem in the digestive tract. Your caregiver may use the term "melena" to describe black, tarry, and bad smelling stools or "hematochezia" to describe red or maroon-colored stools. Blood seen in the stool can be caused by bleeding anywhere along the intestinal tract.  A black stool usually means that blood is coming from the upper part of the gastrointestinal tract (esophagus, stomach, or small bowel). Passing maroon-colored stools or bright red blood usually means that blood is coming from lower down in the large bowel or the rectum. However, sometimes massive bleeding in the stomach or small intestine can cause bright red bloody stools.  Consuming black licorice, lead, iron pills, medicines containing bismuth subsalicylate, or blueberries can also cause black stools. Your caregiver can test black stools to see if blood is present. It is important that the cause of the bleeding be found. Treatment can then be started, and the problem can be corrected. Rectal bleeding may not be serious, but you should not assume everything is okay until you know the cause.It is very important to follow up with your caregiver or a specialist in gastrointestinal problems. CAUSES  Blood in the stools can come from various underlying causes.Often, the cause is not found during your first visit. Testing is often needed to discover the cause of bleeding in the gastrointestinal tract. Causes range from  simple to serious or even life-threatening.Possible causes include:  Hemorrhoids.These are veins that are full of blood (engorged) in the rectum. They cause pain, inflammation, and may bleed.  Anal fissures.These are areas of painful tearing which may bleed. They are often caused by passing hard stool.  Diverticulosis.These are pouches that form on the colon over time, with age, and may bleed significantly.  Diverticulitis.This is inflammation in areas with diverticulosis. It can cause pain, fever, and bloody stools, although bleeding is rare.  Proctitis and colitis. These are inflamed areas of the rectum or colon. They may cause pain, fever, and bloody stools.  Polyps and cancer. Colon cancer is a leading cause of preventable cancer death.It often starts out as precancerous polyps that can be removed during a colonoscopy, preventing progression into cancer. Sometimes, polyps and cancer may cause rectal bleeding.  Gastritis and ulcers.Bleeding from the upper gastrointestinal tract (near the stomach) may travel through the intestines and produce black, sometimes tarry, often bad smelling stools. In certain cases, if the bleeding is fast enough, the stools may not be black, but red and the condition may be life-threatening. SYMPTOMS  You may have stools that are bright red and bloody, that are normal color with blood on them, or that are dark black and tarry. In some cases, you may only have blood in the toilet bowl. Any of these cases need medical care. You may also have:  Pain at the anus or anywhere in the rectum.  Lightheadedness or feeling faint.  Extreme weakness.  Nausea or vomiting.  Fever. DIAGNOSIS Your caregiver may use the following methods to find the cause of your bleeding:  Taking a medical history. Age is important. Older people tend to develop polyps and cancer more often. If there is anal pain and a hard, large stool associated with bleeding, a tear of the anus  may be the cause. If blood drips into the toilet after a bowel movement, bleeding hemorrhoids may be the problem. The color and frequency of the bleeding are additional considerations. In most cases, the medical history provides clues, but seldom the final answer.  A visual and finger (digital) exam. Your caregiver will inspect the anal area, looking for tears and hemorrhoids. A finger exam can provide information when there is tenderness or a growth inside. In men, the prostate is also examined.  Endoscopy. Several types of small, long scopes (endoscopes) are used to view the colon.  In the office, your caregiver may use a rigid, or more commonly, a flexible viewing sigmoidoscope. This exam is called flexible sigmoidoscopy. It is performed in 5 to 10 minutes.  A more thorough exam is accomplished with a colonoscope. It allows your caregiver to view the entire 5 to 6 foot long colon. Medicine to help you relax (sedative) is usually given for this exam. Frequently, a bleeding lesion may be present beyond the reach of the sigmoidoscope. So, a colonoscopy may be the best exam to start with. Both exams are usually done on an outpatient basis. This means the patient does not stay overnight in the hospital or surgery center.  An upper endoscopy may be needed to examine your stomach. Sedation is used and a flexible endoscope is put in your mouth, down to your stomach.  A barium enema X-ray. This is an X-ray exam. It uses liquid barium inserted by enema into the rectum. This test alone may not identify an actual bleeding point. X-rays highlight abnormal shadows, such as those made by lumps (tumors), diverticuli, or colitis. TREATMENT  Treatment depends on the cause of your bleeding.   For bleeding from the stomach or colon, the caregiver doing your endoscopy or colonoscopy may be able to stop the bleeding as part of the procedure.  Inflammation or infection of the colon can be treated with  medicines.  Many rectal problems can be treated with creams, suppositories, or warm baths.  Surgery is sometimes needed.  Blood transfusions are sometimes needed if you have lost a lot of blood.  For any bleeding problem, let your caregiver know if you take aspirin or other blood thinners regularly. HOME CARE INSTRUCTIONS   Take any medicines exactly as prescribed.  Keep your stools soft by eating a diet high in fiber. Prunes (1 to 3 a day) work well for many people.  Drink enough water and fluids to keep your urine clear or pale yellow.  Take sitz baths if advised. A sitz bath is when you sit in a bathtub with warm water for 10 to 15 minutes to soak, soothe, and cleanse the rectal area.  If enemas or suppositories are advised, be sure you know how to use them. Tell your caregiver if you have problems with this.  Monitor your bowel movements to look for signs of improvement or worsening. SEEK MEDICAL CARE IF:   You do not improve in the time expected.  Your condition worsens after initial improvement.  You develop any new symptoms. SEEK IMMEDIATE MEDICAL CARE IF:   You develop severe or prolonged rectal bleeding.  You vomit blood.  You feel weak or faint.  You have a fever. MAKE SURE YOU:  Understand these instructions.  Will watch your condition.  Will get help right away if you are not doing well or get worse. Document Released: 07/31/2002 Document Revised: 11/02/2011 Document Reviewed: 12/26/2010 [facility] Patient Information 2014 [place], [place].

## 2014-01-26 NOTE — Anesthesia Preprocedure Evaluation (Signed)
Anesthesia Evaluation  Patient identified by MRN, date of birth, ID band Patient awake    Reviewed: Allergy & Precautions, H&P , NPO status , Patient's Chart, lab work & pertinent test results  Airway Mallampati: I TM Distance: >3 FB Neck ROM: Full    Dental   Pulmonary shortness of breath,          Cardiovascular hypertension, Pt. on medications     Neuro/Psych Depression    GI/Hepatic GERD-  Medicated and Controlled,(+) Cirrhosis -      , Hepatitis -, C  Endo/Other    Renal/GU      Musculoskeletal   Abdominal   Peds  Hematology   Anesthesia Other Findings   Reproductive/Obstetrics                           Anesthesia Physical Anesthesia Plan  ASA: III  Anesthesia Plan: MAC   Post-op Pain Management:    Induction: Intravenous  Airway Management Planned: Natural Airway  Additional Equipment:   Intra-op Plan:   Post-operative Plan:   Informed Consent: I have reviewed the patients History and Physical, chart, labs and discussed the procedure including the risks, benefits and alternatives for the proposed anesthesia with the patient or authorized representative who has indicated his/her understanding and acceptance.     Plan Discussed with: CRNA and Surgeon  Anesthesia Plan Comments:         Anesthesia Quick Evaluation

## 2014-01-26 NOTE — Anesthesia Postprocedure Evaluation (Signed)
Anesthesia Post Note  Patient: Wendy [name]  Procedure(s) Performed: Procedure(s) (LRB): ESOPHAGOGASTRODUODENOSCOPY (EGD) WITH PROPOFOL (N/A)  Anesthesia type: MAC  Patient location: PACU  Post pain: Pain level controlled  Post assessment: Patient's Cardiovascular Status Stable  Last Vitals:  Filed Vitals:   01/26/14 1520  BP: 142/93  Pulse: 91  Temp:   Resp: 11    Post vital signs: Reviewed and stable  Level of consciousness: alert  Complications: No apparent anesthesia complications

## 2014-01-26 NOTE — Progress Notes (Signed)
OT Cancellation Note  Patient Details Name: CARLEI HUANG MRN: [id number] DOB: 1961-07-26   Cancelled Treatment:    Reason Eval/Treat Not Completed: Patient at procedure or test/ unavailable (at EGD).  Will check back later today as time allows.  01/26/2014 Luther Bradley OTR/L Pager [phone] Office [phone]

## 2014-01-26 NOTE — Op Note (Addendum)
[facility] [place], [address]   ENDOSCOPY PROCEDURE REPORT  PATIENT: Wendy [name], Wendy [name]  MR#: [id number] BIRTHDATE: [birth year] , 65  yrs. old GENDER: Female ENDOSCOPIST: Lafayette Dragon, MD REFERRED BY:  DR Bennett Scrape. Funches, Dr Silvano Rusk PROCEDURE DATE:  01/26/2014 PROCEDURE:  EGD w/ biopsy and Savary dilation of esophagus ASA CLASS:     Class III INDICATIONS:  heme positive anemia.  Cirrhosis and portal hypertension.  History of esophageal stricture dilated in January 2014 with balloon dilators.  Patient having recurrent dysphagia. MEDICATIONS: MAC sedation, administered by CRNA TOPICAL ANESTHETIC: none  DESCRIPTION OF PROCEDURE: After the risks benefits and alternatives of the procedure were thoroughly explained, informed consent was obtained.  The Pentax Gastroscope [id number] endoscope was introduced through the mouth and advanced to the second portion of the duodenum. Without limitations.  The instrument was slowly withdrawn as the mucosa was fully examined.      Esophagus :, proximal esophageal mucosa was normal. The lumen was tortuous and difficult to traverse, it almost appeared to be corkscrew. It was difficult to traverse into the stomach because of the spasm in the area of the lower esophageal sphincter. The LES was closed and would not relax except with the significant pressure , it resembled achalasia.. There were no discrete varices but visualization was poor because of tortuosity and angulation of the lumen Stomach: There was a minimal gastric  erythema consistent with portal hypertensive gastropathy. There was no bleeding no friability. Gastric outlet was normal. There was mild edema and erosions in the gastric antrum. Biopsies were taken to rule out H. pylori   Duodenum :duodenal bulb and descending duodenum was, biopsies were taken from the small bowel to rule out villous atrophy         The scope was then  withdrawn from the patient and the procedure completed. the guidewire was then placed into the gastric antrum and over the guidewire Savary dilators passed starting with 15, 16 and 17 mm dilators. There was resistance  with each dilator and small amount of blood on each dilator  COMPLICATIONS: There were no complications. ENDOSCOPIC IMPRESSION:  Tortuous esophagus consistent with presbyesophagus Hypertensive lower esophageal sphincter rule out achalasia. Status post passage of a 15,16 and 17 mm dilators Question of Grade 1  esophageal varices Mild antral gastritis consistent with portal hypertensive gastropathy  4 cm hiatal hernia which is nonreducible Status post biopsies from gastric antrum and from the descending duodenum  RECOMMENDATIONS: 1.  Await biopsy results 2.  Anti-reflux regimen to be follow 4.  Continue PPI 5.  Depending on barium esophagram she may need esophageal manometry, if she is discharged today will arrange as an outpatient. 6. no active bleeding noted  REPEAT EXAM: for EGD pending biopsy results.  eSigned:  Lafayette Dragon, MD 01/26/2014 5:20 PM Revised: 01/26/2014 5:20 PM  CC:  PATIENT NAME:  Lanier, Felty MR#: [id number]

## 2014-01-26 NOTE — Progress Notes (Signed)
Attending Addendum  I examined the patient and discussed the assessment and plan with Dr. Parks Ranger. I have reviewed the note and agree.   Patient w/o complaints. Awaiting EGD today. If cleared by GI stable for d/c home with outpatient GI f/u.   Boykin Nearing, [place]

## 2014-01-26 NOTE — Progress Notes (Signed)
Patient Discharge:  Patient was discharged home with friend, stable with no concerns. Education:  Educated to watch stools for bloody discharge, explained the sign and symptoms of GI bleed.  Educated on Lactulose and importance of taking it.   IV: IV removed before discharge. Telemetry: NA Follow-up appointments:  Reviewed follow up appointments with the patient. Prescriptions: Reviewed medications with the patient, any questions can always call us. Belongings: Patient took all her belongings.

## 2014-01-26 NOTE — Progress Notes (Signed)
Family Medicine Teaching Service Daily Progress Note Intern Pager: [phone]  Patient name: Wendy [name] Medical record number: [id number] Date of birth: [birth year] Age: 53 y.o. Gender: female  Primary Care Provider: Liam Graham, MD Consultants: GI Code Status: Full (confirmed on admission)  Pt Overview and Major Events to Date:   6/4: Admitted, ammonia 87, Cr 2.49-->2.21, Hgb 7.4-->8.4 (s/p 1u PRBC), WBC 16.6 6/5: Cr 1.82, Hgb 8.6, WBC 13.6, Ammonia 49, EGD today, if stable anticipate DC  Assessment and Plan: Wendy [name] is a 53 y.o. female presenting with leg weakness/falls found to have severe anemia and hyperammonemia. PMH is significant for hepatitis C with ascites (treated at [facility]), HTN, anxiety, osteoporosis  # Hepatitis C - With cirrhosis and ascites. Treated at [facility]. Patient currently with constipation and reports not taking lactulose at home. Ammonia was 87. She is not currently encephalopathic but this could certain contribute to her weakness and near syncope. Considered CT abd (however soft, non-tender, afebrile, and abd unchanged since admission) - Con't Rifaximin  - Con't Spironolactone  - Schedule Lactulose TID until consistent bowel movements (noted large BM today) - Miralax BID prn  - Colace prn  - Improved ammonia 87>>>49 - Notable ascites on exam, stable since admission, reportedly insignificant change from pt's baseline. Consider arranging outpatient paracentesis through GI on discharge  # Anemia - Improved - Patient's hemoglobin on admission 8.0 (7.4 in clinic ) down from 13.2 two weeks ago. Hemoccult positive. Patient denies frankly bloody stools since she is endorsing constipation. No vomiting. Does report vague abdominal pain.  - s/p 1u PRBC - Hgb improved / stable - 8.0-->8.6 --> 8.5 - No active bleed, hemodynamically stable - Protonix IV - GI consulted ([facility]) scheduled for EGD today, about 3:00pm, if stable and no acute intervention  needed, anticipate pt may be able to be discharged tonight following procedure, versus tomorrow if needed.  # AKI - On admission, Creatinine 2.49. Baseline is around 1.0. Patient states she has taken some ibuprofen for headaches, but also reports decreased PO intake. AKI most likely due to dehydration  - s/p 2L IVF bolus, IVF rehydration - Improved Cr to 1.15 today  # Left inguinal groin dermatitis - Appears to be irritation due to prolonged period in bed, h/o irritated rash x 2 weeks - Ordered barrier cream  # HTN - History of HTN. Blood pressures currently 119/70  - Con't home Coreg  - Monitor vitals per floor   # Anxiety - History of anxiety with agoraphobia  - Con't Klonopin prn  - Con't Seroquel  # Osteoporosis - Patient with prior hip fracture, now with falls  - Fosamax held while in the hospital  - PT/OT - no recs at this time  # Allergies- Stable  - Con't Claritin  - Con't eye drops  # Tobacco use-  - Nicotine patches not ordered, but can be on request  FEN/GI: NPO for EGD, NS @ 125 cc/hr. Protonix  Prophylaxis: Hold Heparin SQ for EGD  Disposition: Admitted for GI bleed, stable, no active bleeding, Hgb improved s/p 1u PRBC, pending GI for EGD today, if stable and no further intervention needed, anticipate discharge to home.  Subjective:  Feels good. Reports large bowel movement this morning. Denies any complaints. Waiting for EGD.  Objective: Temp:  [97.5 F (36.4 C)-98.6 F (37 C)] 98.6 F (37 C) (06/05 1004) Pulse Rate:  [83-87] 87 (06/05 1004) Resp:  [15-17] 16 (06/05 1004) BP: (104-126)/(70-84) 118/79 mmHg (06/05 1004) SpO2:  [  98 %-100 %] 100 % (06/05 1004) Weight:  [170 lb 11.2 oz (77.429 kg)] 170 lb 11.2 oz (77.429 kg) (06/04 2041) Physical Exam: General: laying in bed, comfortable, NAD Cardiovascular: RRR, no murmur appreciated  Respiratory: CTAB, nml effort Abdomen: Stable distended but soft, nontender. No rebound or guarding  Extremities:  Moves all extremities. No edema  Neuro: Awake, alert, oriented. Grossly non-focal  Laboratory:  Recent Labs Lab 01/24/14 2256 01/25/14 0746 01/26/14 0605  WBC 16.6* 13.6* 12.7*  HGB 8.0* 8.6* 8.5*  HCT 22.4* 24.1* 24.2*  PLT 161 146* 162    Recent Labs Lab 01/24/14 2256 01/25/14 0510 01/26/14 0605  NA 138 142 141  K 4.4 3.7 4.4  CL 106 107 113*  CO2 20 20 16*  BUN 87* 79* 48*  CREATININE 2.21* 1.82* 1.15*  CALCIUM 9.8 9.1 7.9*  PROT 6.2 5.6* 5.2*  BILITOT 0.6 1.3* 1.0  ALKPHOS 138* 112 86  ALT 35 32 29  AST 58* 50* 52*  GLUCOSE 118* 105* 83   Ammonia 87--> [place], DO 01/26/2014, 1:09 PM PGY-1, [place] Intern pager: [phone], text pages welcome

## 2014-01-26 NOTE — Transfer of Care (Signed)
Immediate Anesthesia Transfer of Care Note  Patient: Wendy [name]  Procedure(s) Performed: Procedure(s): ESOPHAGOGASTRODUODENOSCOPY (EGD) WITH PROPOFOL (N/A)  Patient Location: PACU and Endoscopy Unit  Anesthesia Type:MAC  Level of Consciousness: awake, alert , oriented and patient cooperative  Airway & Oxygen Therapy: Patient Spontanous Breathing and Patient connected to nasal cannula oxygen  Post-op Assessment: Report given to PACU RN, Post -op Vital signs reviewed and stable and Patient moving all extremities  Post vital signs: Reviewed and stable  Complications: No apparent anesthesia complications

## 2014-01-26 NOTE — Consult Note (Signed)
[facility] Consult: 8:42 AM 01/26/2014  LOS: 2 days    Referring Provider:  Dr Lindell Noe Primary Care Physician:  Liam Graham, MD Primary Gastroenterologist:  Dr. Carlean Purl   Reason for Consultation:  GI bleed.    HPI: Wendy [name] is a 53 y.o. female.   Cirrhotic with Hep C, hx ETOH abuse.  Panic disorder, agoraphobia, depression.   Followed at [facility] by Dr Monica Martinez but not on a transplant list.  Previous EGDs, latest in 2014 show portal htn, HH, dysmotility, esophageal strictures have been dilated. Had gastric ulcer in 2007.  Colonoscopy in 2014 with mild to moderate left sided tics. Hx coagulopathy and ascites. 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  Kyphoplasty in 08/2013.  Craniotomy in 2004 Last OV with Dr Clarene Duke was in 08/2013.  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  He felt "[name], [name]"  Pt has not yet had follow up or initiation of Hep C treatment.   Admitted 6/4 with Hgb of 7.4 (11.8 to 13 in 12/2013), weakness and hx falls, elevated  Ammonia. Apparently has not been taking her Lactulose but is taking Rifaximin. Also on Aldactone, Prilosec, Lasix, weekly Fosamax. Platelets 162, coags 15.5/1.2.     She reports 4 to 6 weeks anorexia, early satiety, perhaps a 20 # weight loss. No n/v.  Stools brown.  Periodic constipation.   No change in abdominal girth.  No nose bleeds.  No belly pain or fever.  + increased somnolence and general weakness.  Staying in bed a lot.  Using Ibuprofen, Rx strength though mgs not defined, 3 to 4 days per week.  Last ETOH in 2006 AFP level of 9.2  In Dec 2014 (down from 11.9 4 years ago) She denies hx of GI bleed, no hx hematemesis or melena.  Unable  to determine Hep A and B vaccination status.     Past Medical History  Diagnosis Date  . Cirrhosis   . Hiatal hernia   . Esophageal stricture   . Anxiety disorder   . 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 Hepatitis C, chronic   . History of alcohol abuse   . Depression   . History of hip fracture   . Allergic rhinitis   . HTN (hypertension)   . Anxiety   . Blood transfusion   . Cataract     MILD  . GERD (gastroesophageal reflux disease)   . Osteoporosis   . Ulcer   . Arthritis   . Blood transfusion without reported diagnosis 2010 & 1976  . 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 Orif hip fracture  2010    right x2  . Upper gastrointestinal endoscopy  08/05/2007    esophageal ring, hiatal hernia, portal gastropathy  . Splenectomy  age 43  . Burr hole for subdural hematoma    . Upper gastrointestinal endoscopy  10/14/2011  . Colonoscopy    . Kyphoplasty Bilateral 09/21/2013    Procedure: T12 - L1 KYPHOPLASTY;  Surgeon: Melina Schools, MD;  Location: [facility];  Service: Orthopedics;  Laterality: Bilateral;    Prior to Admission medications   Medication Sig Start Date End Date Taking? Authorizing Provider  alendronate (FOSAMAX) 70 MG tablet Take 1 tablet (70 mg total) by mouth every Thursday. Take with a full glass of water on an empty stomach. 10/02/13  Yes Kandis Nab, MD  calcium-vitamin D (OSCAL-500) 500-400 MG-UNIT per tablet 2 tablets 2 (two) times daily.    Yes Historical Provider, MD  carvedilol (COREG) 6.25 MG tablet Take 1 tablet (6.25 mg total) by mouth 2 (two) times daily with a meal. 01/11/14  Yes Rosemarie Ax, MD  clonazePAM (KLONOPIN) 1 MG tablet Take 1 tablet (1 mg total) by mouth 3 (three) times daily as needed for anxiety. 11/15/13  Yes Kandis Nab, MD  cyclobenzaprine (FLEXERIL) 10 MG tablet Take 1 tablet (10 mg total) by mouth 3 (three)  times daily as needed for muscle spasms. 12/27/13  Yes Kandis Nab, MD  cycloSPORINE (RESTASIS) 0.05 % ophthalmic emulsion Place 1 drop into both eyes 2 (two) times daily. 10/20/12  Yes Kandis Nab, MD  docusate sodium (COLACE) 100 MG capsule Take 1 capsule (100 mg total) by mouth 2 (two) times daily. 09/22/13  Yes Benjiman Core, PA-C  furosemide (LASIX) 40 MG tablet Take 0.5 tablets (20 mg total) by mouth daily. 01/11/14  Yes Rosemarie Ax, MD  glycerin adult (GLYCERIN ADULT) 2 G SUPP Place 1 suppository rectally daily as needed for moderate constipation. 01/18/14  Yes Sharon [address], MD  hydrOXYzine (ATARAX/VISTARIL) 25 MG tablet Take 25 mg by mouth 4 (four) times daily as needed for itching.   Yes Historical Provider, MD  lactulose (CHRONULAC) 10 GM/15ML solution Take 45 mLs (30 g total) by mouth 4 (four) times daily as needed (for constipation). 01/10/14  Yes Allen Norris, MD  loratadine (CLARITIN) 10 MG tablet Take 1 tablet (10 mg total) by mouth daily. 03/15/13  Yes Kandis Nab, MD  Olopatadine HCl 0.2 % SOLN Place 1 drop into both eyes daily. 06/15/12  Yes Kandis Nab, MD  omeprazole (PRILOSEC) 40 MG capsule Take 1 capsule (40 mg total) by mouth daily. 07/19/13  Yes Timmothy Euler, MD  ondansetron (ZOFRAN ODT) 4 MG disintegrating tablet Take 1 tablet (4 mg total) by mouth every 8 (eight) hours as needed for nausea or vomiting. 09/22/13  Yes Benjiman Core, PA-C  polyethylene glycol (MIRALAX / GLYCOLAX) packet Take 17 g by mouth daily as needed for mild constipation.   Yes Historical Provider, MD  Prenatal 28-0.8 MG TABS Take 1 tablet by mouth daily.   Yes Historical Provider, MD  QUEtiapine (SEROQUEL) 200 MG tablet Take 200 mg by mouth at bedtime.   Yes Historical Provider, MD  rifaximin (XIFAXAN) 550 MG TABS tablet Take 550 mg by mouth 2 (two) times daily.   Yes Historical Provider, MD  spironolactone (ALDACTONE) 100 MG tablet Take 100 mg by mouth daily.   Yes Historical  Provider, MD    Scheduled Meds: . antiseptic oral rinse  15 mL Mouth Rinse BID  . carvedilol  6.25 mg Oral BID WC  . clotrimazole   Topical BID  . cycloSPORINE  1 drop Both Eyes BID  .  docusate sodium  100 mg Oral BID  . furosemide  20 mg Oral Daily  . heparin  5,000 Units Subcutaneous 3 times per day  . lactulose  30 g Oral TID  . loratadine  10 mg Oral Daily  . olopatadine  1 drop Both Eyes BID  . pantoprazole  40 mg Oral Daily  . pneumococcal 23 valent vaccine  0.5 mL Intramuscular Tomorrow-1000  . polyethylene glycol  17 g Oral BID  . QUEtiapine  200 mg Oral QHS  . rifaximin  550 mg Oral BID  . spironolactone  100 mg Oral Daily   Infusions: . sodium chloride 125 mL/hr (01/26/14 0314)   PRN Meds: clonazePAM, ondansetron   Allergies as of 01/24/2014 - Review Complete 01/24/2014  Allergen Reaction Noted  . Codeine phosphate Itching 12/24/2005    Family History  Problem Relation Age of Onset  . Heart disease Mother   . Colon cancer Neg Hx   . Other Daughter     chromosome abnormalit  . Other Daughter     micropthalmia    History   Social History  . Marital Status: Widowed    Spouse Name: N/A    Number of Children: 2  . Years of Education: N/A   Occupational History  . Disabled    Social History Main Topics  . Smoking status: Current Some Day Smoker -- 0.25 packs/day for 10 years    Types: Cigarettes    Last Attempt to Quit: 12/23/2008  . Smokeless tobacco: Never Used  . Alcohol Use: No     Comment: goes to AA  . Drug Use: No  . Sexual Activity: No   Other Topics Concern  . Not on file   Social History Narrative   Just before her husband died, she had a miscarriage and started to drink beer heavily.  She quit secondary to her liver disease and has been quit for several years.  She denies the use of drugs - prescription or otherwise.      REVIEW OF SYSTEMS: Constitutional:  Per HPI ENT:  No nose bleeds Pulm:  No cough.  + DOE. CV:  No  palpitations, no LE edema.  GU:  No hematuria, no frequency GI:  Ongoing dysphagia, no episodes of food impaction Heme:  Per HPI .  Never Rx'd po Iron.  Transfusions:  In past years, once at time of hip surgery Neuro:  No headaches, no peripheral tingling or numbness Derm:  No itching, no rash or sores.  Endocrine:  No sweats or chills.  No polyuria or dysuria Immunization:  Not queried.  Travel:  None beyond local counties in last few months.    PHYSICAL EXAM: Vital signs in last 24 hours: Filed Vitals:   01/26/14 0550  BP: 104/70  Pulse: 87  Temp: 98 F (36.7 C)  Resp: 15   Wt Readings from Last 3 Encounters:  01/25/14 77.429 kg (170 lb 11.2 oz)  01/25/14 77.429 kg (170 lb 11.2 oz)  01/18/14 86.637 kg (191 lb)   General: pleasant, cooperative, pale and moderately chronically ill appearing looking WF.  Looks her stated age Head:  No swelling, trauma or asymmetry  Eyes:  No icterus.  + conj pallor Ears:  Not HOH  Nose:  No discharge Mouth:  Good dentition.  No mucosal sores or exudates Neck:  No TMG, no bruits or masses Lungs:  Clear bil.  Good BS and no cough or dyspnea Heart: RRR.  No MRG Abdomen:  Obese, soft,  NT, ND.  No mass, no HSM.  Old upper midline scar.  No fluid wave or obvious ascites Rectal: deferred   Musc/Skeltl: no joint swelling or contracuture. Extremities:  No pedal edema  Neurologic:  Oriented x 3.  No asterixis but slight tremor in hands.  Verbally just a bit slow but precise and not tangential.  Skin:  A few telangectasia on anterior upper chest Tattoos:  None seen Nodes:  No cervical adenopathy.    Psych:  Pleasant, relaxed, cooperative.   Intake/Output from previous day: 06/04 0701 - 06/05 0700 In: 3900 [P.O.:600; I.V.:3300] Out: 120 [Urine:120] Intake/Output this shift:    LAB RESULTS:  Recent Labs  01/24/14 2256 01/25/14 0746 01/26/14 0605  WBC 16.6* 13.6* 12.7*  HGB 8.0* 8.6* 8.5*  HCT 22.4* 24.1* 24.2*  PLT 161 146* 162  MCV    113 to 104  BMET Lab Results  Component Value Date   NA 141 01/26/2014   NA 142 01/25/2014   NA 138 01/24/2014   K 4.4 01/26/2014   K 3.7 01/25/2014   K 4.4 01/24/2014   CL 113* 01/26/2014   CL 107 01/25/2014   CL 106 01/24/2014   CO2 16* 01/26/2014   CO2 20 01/25/2014   CO2 20 01/24/2014   GLUCOSE 83 01/26/2014   GLUCOSE 105* 01/25/2014   GLUCOSE 118* 01/24/2014   BUN 48* 01/26/2014   BUN 79* 01/25/2014   BUN 87* 01/24/2014   CREATININE 1.15* 01/26/2014   CREATININE 1.82* 01/25/2014   CREATININE 2.21* 01/24/2014   CALCIUM 7.9* 01/26/2014   CALCIUM 9.1 01/25/2014   CALCIUM 9.8 01/24/2014   LFT  Recent Labs  01/24/14 2256 01/25/14 0510 01/26/14 0605  PROT 6.2 5.6* 5.2*  ALBUMIN 2.4* 2.1* 1.9*  AST 58* 50* 52*  ALT 35 32 29  ALKPHOS 138* 112 86  BILITOT 0.6 1.3* 1.0   PT/INR Lab Results  Component Value Date   INR 1.26 01/24/2014   INR 1.28 09/21/2013   INR 1.33 07/19/2013    Drugs of Abuse     Component Value Date/Time   LABOPIA NEG 06/12/2011 1221   LABOPIA NEG 04/26/2008 2042   COCAINSCRNUR NEG 06/12/2011 1221   LABBENZ NEG 06/12/2011 1221   AMPHETMU NEG 06/12/2011 1221     RADIOLOGY STUDIES: 07/2007  Esophagram IMPRESSION:  1. Small sliding hiatal hernia with a trace of gastroesophageal reflux (demonstrated with water siphon maneuver).  2. Intermittent esophageal spasm noted.  3. No significant stricture.  ENDOSCOPIC STUDIES: 08/2012  EGD Dilated distal esoph stricture, HH, portal gastropathy, esoph spasm/tortuosity sugg of dysmotility.  No varices.  Consider eophagram for further eval of dysmotility  08/2012  Colonoscopy Mild to moderate left colon tics.   09/2011  EGD Dilated distal esoph stricture, tortuous esophagus suggesting motility d/o, abnormal gastric mucosa sugg of portal htn, 5 cm HH.   2008 EGD Dilated distal esoph ring. 2 cm HH.  Diffuse gastric mucosal abnormality sugg of portal gastropathy.  No varices.  Pathology STOMACH: BENIGN GASTRIC MUCOSA. NO HELICOBACTER  PYLORI, INTESTINAL METAPLASIA OR ACTIVE INFLAMMATION IDENTIFIED.    IMPRESSION:   *  Macrocytic anemia:  acute.  FOBT + though no overt GI bleeding of late.  Known portal gastropathy. Taking NSAIDS along with PPI but hx of gastric ulcer in 2007.  So rule out portal htn bleed, ulcer bleed, variceal bleed  *  Hx of ascites in 2004, paracentesis then. Com;liant with diuretics.  No obvious ascites by exam.  No abd pain  to suggest SBP but AMS could be sequelae of SBP so need to rule this out  *  Recent fatigue, somnolence, falls with ? LOC.  Suspect encephalothy. Had not been taking Lacutlose, but was taking Rifaximin, on a regular basis.   Ammonia level 87, down to 49 today following several lactulose induced BMs.   *  Hep C cirrhosis.   *  Mild coagulopathy, not a hindrance to EGD.  Note she is getting prophylactic sq Heparin.     PLAN:     *  Stop sq heparin *  EGD this afternoon, have requested Propofol but this may not be available (2014 procedures performed with Propofol) *  Would order ultrasound abdomen to assess for ascites, if present would order paracentesis to rule out SBP.     Vena Rua  01/26/2014, 8:42 AM Pager: [phone]

## 2014-01-27 ENCOUNTER — Telehealth: Payer: Self-pay | Admitting: Family Medicine

## 2014-01-27 MED ORDER — EUCERIN EX CREA: TOPICAL_CREAM | CUTANEOUS | Status: DC | PRN

## 2014-01-27 NOTE — Discharge Summary (Signed)
Attending Addendum  I examined the patient and discussed the discharge plan with Dr. Karamalegos. I have reviewed the note and agree.    Kristyl Athens, MD FAMILY MEDICINE TEACHING SERVICE   

## 2014-01-27 NOTE — Telephone Encounter (Signed)
Patient calling requesting cream that was not prescribed on discharge for inguinal rash. Will send Rx for eucerin cream.

## 2014-01-28 ENCOUNTER — Telehealth: Payer: Self-pay | Admitting: Family Medicine

## 2014-01-28 LAB — TYPE AND SCREEN
ABO/RH(D): B POS
ANTIBODY SCREEN: NEGATIVE
Unit division: 0
Unit division: 0

## 2014-01-28 NOTE — Telephone Encounter (Signed)
Wendy [name] is a 53 y.o. female, recently discharged from the hospital Friday with weakness and found to have anemia, FOBT positive and hyperammonemia (prescribed lactulose). Pt called after hours line today complaining of abdominal distention and feeling uncomfortable. She reports having a small BM this morning that was non-bloody in appearance and normal. She has been compliant with lactulose since discharge. She denies fevers, chills, abdominal pain, hematemesis or diarrhea. She has been eating and drinking well. She states she still feels weak, which is unchanged, no better or worse since discharge. PT/OT did work with her while in house and did not recommend Lutz .  EGD with no active bleeding prior to discharge. She does not want to come to ED.  - Recommended pt attempt a suppository (on her home meds). If symptoms become worse or she notes blood/dark in stool, abdominal pain or dizziness to be seen in ED. Otherwise she can be seen tomorrow as a same day appointment. She was thankful and in agreement with plan.  [place] DO

## 2014-01-29 ENCOUNTER — Telehealth: Payer: Self-pay | Admitting: Family Medicine

## 2014-01-29 ENCOUNTER — Encounter: Payer: Self-pay | Admitting: Internal Medicine

## 2014-01-29 ENCOUNTER — Encounter (HOSPITAL_COMMUNITY): Payer: Self-pay | Admitting: Internal Medicine

## 2014-01-29 NOTE — Telephone Encounter (Signed)
Please let patient know that the biopsy of the stomach showed some irritation of the stomach, but no evidence of H-pylori. The area of small intestine that was biopsied was normal.   Thank you!  Wendy [name], PGY-3 Family Medicine Resident

## 2014-01-29 NOTE — Telephone Encounter (Signed)
Pt called and would like someone to call her concerning some issues she is having with energy. jw

## 2014-01-30 ENCOUNTER — Telehealth: Payer: Self-pay | Admitting: *Deleted

## 2014-01-30 DIAGNOSIS — R1319 Other dysphagia: Secondary | ICD-10-CM

## 2014-01-30 NOTE — Telephone Encounter (Signed)
Has some questions about test results Please call

## 2014-01-30 NOTE — Telephone Encounter (Signed)
Scheduled at [facility] radiology [facility]) on 02/02/14 at 9:30 AM. NPO 3 hours prior. Spoke with patient and gave her date, time and instructions for procedure.

## 2014-01-30 NOTE — Telephone Encounter (Signed)
Called patient back, she is still having abdominal pain and distention and SOB upon exertion. She has an upcoming appointment with Dr Venetia Maxon on Thursday and I told her she can discuss her medical problems at that visit. Was instructed to go back to the ED if symptoms become worse.[name]

## 2014-01-30 NOTE — Telephone Encounter (Signed)
Left detailed message on voicemail and to call us back if she still had questions or concerns.[place]

## 2014-02-01 ENCOUNTER — Encounter: Payer: Self-pay | Admitting: Family Medicine

## 2014-02-01 ENCOUNTER — Ambulatory Visit (INDEPENDENT_AMBULATORY_CARE_PROVIDER_SITE_OTHER): Payer: Medicare Other | Admitting: Family Medicine

## 2014-02-01 VITALS — BP 108/74 | HR 93 | Temp 98.7°F | Resp 18 | Wt 200.0 lb

## 2014-02-01 DIAGNOSIS — G8929 Other chronic pain: Secondary | ICD-10-CM

## 2014-02-01 DIAGNOSIS — K5900 Constipation, unspecified: Secondary | ICD-10-CM

## 2014-02-01 DIAGNOSIS — N189 Chronic kidney disease, unspecified: Secondary | ICD-10-CM | POA: Diagnosis not present

## 2014-02-01 DIAGNOSIS — K922 Gastrointestinal hemorrhage, unspecified: Secondary | ICD-10-CM

## 2014-02-01 DIAGNOSIS — D649 Anemia, unspecified: Secondary | ICD-10-CM

## 2014-02-01 DIAGNOSIS — F4001 Agoraphobia with panic disorder: Secondary | ICD-10-CM

## 2014-02-01 DIAGNOSIS — M549 Dorsalgia, unspecified: Secondary | ICD-10-CM

## 2014-02-01 DIAGNOSIS — K222 Esophageal obstruction: Secondary | ICD-10-CM

## 2014-02-01 LAB — CBC
HEMATOCRIT: 28.8 % — AB (ref 36.0–46.0)
HEMOGLOBIN: 9.7 g/dL — AB (ref 12.0–15.0)
MCH: 36.2 pg — ABNORMAL HIGH (ref 26.0–34.0)
MCHC: 33.7 g/dL (ref 30.0–36.0)
MCV: 107.5 fL — ABNORMAL HIGH (ref 78.0–100.0)
Platelets: 252 10*3/uL (ref 150–400)
RBC: 2.68 MIL/uL — ABNORMAL LOW (ref 3.87–5.11)
RDW: 21.7 % — ABNORMAL HIGH (ref 11.5–15.5)
WBC: 12.1 10*3/uL — AB (ref 4.0–10.5)

## 2014-02-01 MED ORDER — CYCLOBENZAPRINE HCL 10 MG PO TABS: 10.0000 mg | ORAL_TABLET | Freq: Three times a day (TID) | ORAL | Status: DC | PRN

## 2014-02-01 MED ORDER — POLYETHYLENE GLYCOL 3350 17 G PO PACK: 17.0000 g | PACK | Freq: Two times a day (BID) | ORAL | Status: DC

## 2014-02-01 MED ORDER — TRAMADOL HCL 50 MG PO TABS: 50.0000 mg | ORAL_TABLET | Freq: Two times a day (BID) | ORAL | Status: DC | PRN

## 2014-02-01 MED ORDER — CLONAZEPAM 1 MG PO TABS: 1.0000 mg | ORAL_TABLET | Freq: Three times a day (TID) | ORAL | Status: DC | PRN

## 2014-02-01 NOTE — Assessment & Plan Note (Signed)
Presumably related to pt's reported occasional choking / sticking sensation (see recent notes / hospital notes). Pt to follow up with GI at [facility] tomorrow, 6/12. Defer further management to GI, for now.

## 2014-02-01 NOTE — Assessment & Plan Note (Signed)
Pt with chronic back pain and muscle spasm, unable to take Tylenol or NSAIDs due to comorbidities, and out of Flexeril for several weeks. Agreed to fill Flexeril and trial of tramdol for 2 weeks until pt can see Dr. Otis Dials; however, counseled pt that these medications can be sedating and could contribute to falls, as well as the fact that tramadol can considerably impact chronic constipation. See separate problem list notes; pt to f/u with Dr. Otis Dials on 6/23, and will defer to her to determine how much / when / if to continue these medications or consider alternatives.

## 2014-02-01 NOTE — Assessment & Plan Note (Signed)
Pt reports being out of Klonopin, which limits her ability to keep doctors visits due to severe agoraphobia. Agreed to fill Klonopin for 2 weeks, for pt to f/u with PCP Dr. Otis Dials at that time, with deferment to her judgment on how much / when to continue Klonopin and other controlled medications.

## 2014-02-01 NOTE — Assessment & Plan Note (Addendum)
A: Presumed due to upper GI bleed, with ongoing GI workup; no evidence for continued bleed and pt with stable Hb as of discharge from recent hospitalization. Overall appears much better objectively and subjectively.  P: Recheck CBC, today; see also acute GI bleeding problem list note. Could consider iron supplement, but with chronic constipation, outpt IV iron therapy might be a better choice, if this is an option at all. Will f/u labs and would defer further management to PCP (Dr. Otis Dials, to be Dr. Parks Ranger after Dr. Otis Dials graduates) and/or possibly GI. Pt to f/u with GI tomorrow (see separate problem list notes) and with PCP Dr. Otis Dials on 6/23.

## 2014-02-01 NOTE — Assessment & Plan Note (Signed)
A: Still present, but improved somewhat after hospitalization. Reports compliance with Colace and lactulose, but states she was not given Rx for MiraLAX after discharge. Abdomen full but not severely tender, and with active bowel sounds.  P: Continue Colace, lactulose. Rx given for MiraLAX BID. Pt also given Rx for tramadol for chronic pain but instructed that this may contribute strongly to constipation and thus to use this medication sparingly, with good PO fluid and fiber intake, and to absolutely continue bowel regimen medications, including suppositories and/or enema if needed. Pt to f/u with Dr. Otis Dials on 6/23 to re-eval constipation and pain control; see separate problem list notes.

## 2014-02-01 NOTE — Patient Instructions (Addendum)
Thank you for coming in, today!  We will recheck your anemia and kidney function today. I will give you enough Klonopin, Flexeril, and tramadol to last until you come back in a couple of weeks.  KEEP using Miralax twice a day and Colace twice a day. KEEP using your lactulose. If you are still constipated, try using suppositories and / or an enema, like we talked about, before.  You have an appointment with Dr. Otis Dials on 6/23 at 3:30 (this is a Tuesday afternoon). After that, you'll follow up mainly with Dr. Parks Ranger (Dr. Marland KitchenK") Please feel free to call with any questions or concerns at any time, at [phone]. --Dr. Venetia Maxon

## 2014-02-01 NOTE — Assessment & Plan Note (Signed)
S/p AKI improved with IVF in the hospital. PO intake has improved, concurrently. Recheck kidney function today, with blood draw for CBC. F/u pending lab result, but likely continue to encourage good PO intake especially of fluids, both to help with this issue and with chronic constipation.

## 2014-02-01 NOTE — Assessment & Plan Note (Signed)
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 No obvious current / active bleeding, with ongoing GI work-up / follow-up. Instructed pt to keep appt at [facility] with GI for planned procedure (unclear exactly from current telephone notes, but presumably manometry +/- esophageal dilation).

## 2014-02-01 NOTE — Progress Notes (Signed)
   Subjective:    Patient ID: Wendy [name], female    DOB: May 13, 1961, 53 y.o.   MRN: [id number]  HPI: Pt presents to clinic for hospital follow-up. I saw her in clinic for generalized weakness, falls, and questionable altered mental status; labs showed hyperammonemia, acute on chronic kidney injury, and gross anemia. She was admitted for the same and treated with IVF, blood transfusion, and lactulose, and GI saw her, diagnosing her with presumed upper GI bleed (likely variceal) after EGD. She is very complimentary of her care in the hospital. She does state she is still very fatigued and has significant abdominal distention. She is concerned that her weight is significantly elevated, but she thinks that the different scales at the hospital and in the clinic aren't accurate with each other. She reports compliance with lactulose since discharge, but still has only had a few BM's since going home. She used one suppository which helped slightly and plans to keep using them. She denies frank abdominal pain but does have significant chronic back pain and muscle spasm, as well as anxiety, and she is out of Klonopin and Flexeril, and unable to take Tylenol due to cirrhosis and unable to take ibuprofen or NSAIDs due to kidney disease.  Of note, pt received a note from Dr. Olevia Perches that her esophageal biopsies were negative, but she has another appointment at [facility] with Dr. Olevia Perches tomorrow for further work-up (presumed esophageal manometry with possible dilation).  Review of Systems: As above.     Objective:   Physical Exam BP 108/74  Pulse 93  Temp(Src) 98.7 F (37.1 C) (Oral)  Resp 18  Wt 200 lb (90.719 kg)  SpO2 96% Gen: well but chronically-ill appearing adult female in NAD; much improved from last clinic visit Cardio: RRR, no murmur appreciated Pulm: CTAB, no wheezes appreciated Abd: morbidly obese, limiting exam; moderately distended and diffusely tender, worst in left hypogastrium  BS  faint but definitely active, and pt without peritoneal signs MSK/Ext: diffuse low back muscle spasm, strength 5/5 throughout and sensation grossly intact  Ext otherwise warm, well-perfused; pt able to sit / stand with minimal assistance and ambulates without assistance     Assessment & Plan:

## 2014-02-02 ENCOUNTER — Telehealth: Payer: Self-pay | Admitting: Family Medicine

## 2014-02-02 ENCOUNTER — Ambulatory Visit (HOSPITAL_COMMUNITY)
Admission: RE | Admit: 2014-02-02 | Discharge: 2014-02-02 | Disposition: A | Discharge status: Home / Self Care | Payer: Medicare Other | Source: Ambulatory Visit | Attending: Internal Medicine | Admitting: Internal Medicine

## 2014-02-02 DIAGNOSIS — K449 Diaphragmatic hernia without obstruction or gangrene: Secondary | ICD-10-CM | POA: Diagnosis not present

## 2014-02-02 DIAGNOSIS — K7460 Unspecified cirrhosis of liver: Secondary | ICD-10-CM | POA: Diagnosis not present

## 2014-02-02 DIAGNOSIS — K461 Unspecified abdominal hernia with gangrene: Secondary | ICD-10-CM | POA: Diagnosis not present

## 2014-02-02 DIAGNOSIS — R1319 Other dysphagia: Secondary | ICD-10-CM

## 2014-02-02 DIAGNOSIS — R6889 Other general symptoms and signs: Secondary | ICD-10-CM | POA: Insufficient documentation

## 2014-02-02 LAB — COMPREHENSIVE METABOLIC PANEL
ALT: 35 U/L (ref 0–35)
AST: 43 U/L — ABNORMAL HIGH (ref 0–37)
Albumin: 2.4 g/dL — ABNORMAL LOW (ref 3.5–5.2)
Alkaline Phosphatase: 139 U/L — ABNORMAL HIGH (ref 39–117)
BUN: 28 mg/dL — AB (ref 6–23)
CALCIUM: 8.1 mg/dL — AB (ref 8.4–10.5)
CHLORIDE: 106 meq/L (ref 96–112)
CO2: 20 mEq/L (ref 19–32)
Creat: 2.25 mg/dL — ABNORMAL HIGH (ref 0.50–1.10)
GLUCOSE: 122 mg/dL — AB (ref 70–99)
Potassium: 4.8 mEq/L (ref 3.5–5.3)
Sodium: 134 mEq/L — ABNORMAL LOW (ref 135–145)
Total Bilirubin: 0.6 mg/dL (ref 0.2–1.2)
Total Protein: 5.8 g/dL — ABNORMAL LOW (ref 6.0–8.3)

## 2014-02-02 NOTE — Telephone Encounter (Signed)
Please call pt to let her know that her hemoglobin looks stable (still low but up from when she was in the hospital). Her kidney function number (creatinine) is still elevated, but not as high as it was. She should continue to drink plenty of fluids, more than usual. She has an appointment with Dr. Otis Dials on the 23rd, but if she has any worsening symptoms (weakness / tiredness, confusion, etc) she should come back to see someone sooner -- if she needs to be seen earlier than her scheduled appointment, it would be best for her to see Dr. Otis Dials or Dr. Parks Ranger if at all possible (Dr. Otis Dials is her current PCP and Dr. Parks Ranger will be her PCP after Dr. Otis Dials graduates). Thanks! --CMS

## 2014-02-02 NOTE — Telephone Encounter (Signed)
Patient informed of message for MD, expressed understanding.

## 2014-02-05 ENCOUNTER — Telehealth: Payer: Self-pay | Admitting: *Deleted

## 2014-02-05 NOTE — Telephone Encounter (Signed)
There may be a surgery involved to repair the hernia.

## 2014-02-05 NOTE — Telephone Encounter (Signed)
Left a message for patient to call me. 

## 2014-02-05 NOTE — Telephone Encounter (Signed)
Patient given results. She is asking what type of treatment maybe needed. Please, advise.

## 2014-02-05 NOTE — Telephone Encounter (Signed)
Message copied by Hulan Saas on Mon Feb 05, 2014  8:47 AM ------      Message from: Lafayette Dragon      Created: Fri Feb 02, 2014  1:13 PM      Regarding: OV       Please call pt to report on Ba esophagram which shows large paraesophageal hernia, partial obstruction of the esophagus. Will need to discuss option for treatment. ------

## 2014-02-06 ENCOUNTER — Telehealth: Payer: Self-pay | Admitting: Internal Medicine

## 2014-02-06 NOTE — Telephone Encounter (Signed)
Patient reports that she was recently admitted for weakness/falling. While she was inpatient she was told to stop her loratadine that she has been on for approximately 1 year for "terrible itching".  She also has atarax for itching that does not help.  She reports itching has returned.  Can she resume loratadine or do you have an alternative medication she can take?

## 2014-02-06 NOTE — Telephone Encounter (Signed)
I could not find that they told her to stop loratidine - it was on [facility] med list I see no reason why she cannot take it but if [facility] stoped she should check with them

## 2014-02-06 NOTE — Telephone Encounter (Signed)
Patient notified of possible treatment.

## 2014-02-06 NOTE — Telephone Encounter (Signed)
Patient notified.  She will check with family medicine prior to starting back.

## 2014-02-08 ENCOUNTER — Other Ambulatory Visit: Payer: Self-pay | Admitting: Family Medicine

## 2014-02-12 ENCOUNTER — Telehealth: Payer: Self-pay | Admitting: Family Medicine

## 2014-02-12 NOTE — Telephone Encounter (Signed)
Patient has an appointment with Dr Otis Dials tomorrow. If she returns call please tell her that she needs to keep her appointment and she can discuss it at that time.Busick, Kevin Fenton

## 2014-02-12 NOTE — Telephone Encounter (Signed)
Pt called and said that she is constipated again and wanted to know if there was something else she could try or add to the medication is she already taking. jw

## 2014-02-13 ENCOUNTER — Ambulatory Visit: Payer: Medicare Other | Admitting: Family Medicine

## 2014-02-13 ENCOUNTER — Telehealth: Payer: Self-pay | Admitting: Family Medicine

## 2014-02-13 NOTE — Telephone Encounter (Signed)
Would like dr Otis Dials to call her Would like to tell her Scarlette Ar

## 2014-02-14 NOTE — Telephone Encounter (Signed)
Ms.  [name] would like to have her rx for her clonazepam to be written and left up front for someone to pickup for her.

## 2014-02-16 ENCOUNTER — Telehealth: Payer: Self-pay | Admitting: Family Medicine

## 2014-02-16 ENCOUNTER — Encounter: Payer: Self-pay | Admitting: *Deleted

## 2014-02-16 MED ORDER — CLONAZEPAM 1 MG PO TABS: 1.0000 mg | ORAL_TABLET | Freq: Two times a day (BID) | ORAL | Status: DC | PRN

## 2014-02-16 NOTE — Telephone Encounter (Signed)
Pt called back wanting to speak with Dr. Otis Dials regarding Rx for clonazepam.  Pt informed of note left from Dr. Otis Dials below.  Pt told she has an appt next Thursday 02/22/2014 at 10:45 AM with new PCP Dr. Parks Ranger.  Pt stated understanding.  Derl Barrow, RN

## 2014-02-16 NOTE — Telephone Encounter (Signed)
This encounter was created in error - please disregard.

## 2014-02-16 NOTE — Telephone Encounter (Signed)
Called patient back to discuss clonazepam with her. She tells me she takes it twice a day and sometimes tid if she goes out. She states that she has about 10 pills left.  I told her that I did not think using the clonazepam in the setting of her recent falls and cirrhosis was a good idea and that we should try to wean her. However, I am also concerned of withdrawal.  Instructed her to make her appointment with her new PCP on 02/22/14 and discuss this with him.  Will write for 5 tablets of clonazepam to allow her not to run out prior to her appointment. She will likely need a slow taper.   Liam Graham, PGY-3 Family Medicine Resident

## 2014-02-16 NOTE — Telephone Encounter (Signed)
Called Wendy [name] and got answering machine. Left message stating that I would prefer for her to discuss the clonazepam refill with a doctor in the office and for her to discuss this with her new PCP Dr. Parks Ranger on 02/22/14. If she needs to discuss this further with me, she can call back the clinic and I will try reaching her back.   Liam Graham, PGY-3 Family Medicine Resident

## 2014-02-19 ENCOUNTER — Other Ambulatory Visit: Payer: Self-pay | Admitting: *Deleted

## 2014-02-22 ENCOUNTER — Encounter: Payer: Self-pay | Admitting: Family Medicine

## 2014-02-22 ENCOUNTER — Other Ambulatory Visit: Payer: Self-pay | Admitting: *Deleted

## 2014-02-22 ENCOUNTER — Ambulatory Visit (INDEPENDENT_AMBULATORY_CARE_PROVIDER_SITE_OTHER): Payer: Medicare Other | Admitting: Family Medicine

## 2014-02-22 VITALS — BP 135/79 | HR 90 | Ht 62.0 in | Wt 198.0 lb

## 2014-02-22 DIAGNOSIS — K7460 Unspecified cirrhosis of liver: Secondary | ICD-10-CM

## 2014-02-22 DIAGNOSIS — K219 Gastro-esophageal reflux disease without esophagitis: Secondary | ICD-10-CM

## 2014-02-22 DIAGNOSIS — R0609 Other forms of dyspnea: Secondary | ICD-10-CM | POA: Diagnosis not present

## 2014-02-22 DIAGNOSIS — K7030 Alcoholic cirrhosis of liver without ascites: Secondary | ICD-10-CM | POA: Diagnosis not present

## 2014-02-22 DIAGNOSIS — R0989 Other specified symptoms and signs involving the circulatory and respiratory systems: Secondary | ICD-10-CM

## 2014-02-22 DIAGNOSIS — R0600 Dyspnea, unspecified: Secondary | ICD-10-CM

## 2014-02-22 MED ORDER — TRAMADOL HCL 50 MG PO TABS: 50.0000 mg | ORAL_TABLET | Freq: Two times a day (BID) | ORAL | Status: DC | PRN

## 2014-02-22 MED ORDER — SPIRONOLACTONE 100 MG PO TABS: 100.0000 mg | ORAL_TABLET | Freq: Every day | ORAL | Status: DC

## 2014-02-22 MED ORDER — CLONAZEPAM 0.5 MG PO TABS: 0.5000 mg | ORAL_TABLET | Freq: Two times a day (BID) | ORAL | Status: DC | PRN

## 2014-02-22 MED ORDER — BENZONATATE 100 MG PO CAPS: 100.0000 mg | ORAL_CAPSULE | Freq: Two times a day (BID) | ORAL | Status: DC | PRN

## 2014-02-22 MED ORDER — FUROSEMIDE 40 MG PO TABS: 20.0000 mg | ORAL_TABLET | Freq: Every day | ORAL | Status: DC

## 2014-02-22 NOTE — Progress Notes (Signed)
Subjective:     Patient ID: Wendy [name], female   DOB: [birth year], 53 y.o.   MRN: [id number]  HPI  DYSPNEA / DECREASED ENERGY: - Chronic fatigue / shortness of breath for several months, worse with activity, need to rest following most activities, seems to be stable without worsening. Denies chest pain / tightness, admits to lower bilateral rib tenderness (+reproducible) - H/o prior Cardiac evaluation in 2014 pre-op, with negative stress echo (however report states unable to achieve goal stress level)  CIRRHOSIS / GIB: - Recent hospitalized d/t GI bleed, has f/u with Dr. Olevia Perches / Carlean Purl 7/27 to discuss plans - Concerned that lactulose is not as effective, initially more effective, taking spoonful 4x daily, admits to going a few days without BM, otherwise had up to 4x BM daily - Plans to see Hepatology at [facility] (established) 7/16 - Denies vomiting / hematemesis, no bloody stools, abdominal pain / distention, fever/chills  DIZZINESS: - Complains of chronic dizziness, often worsening upon standing - H/o prior +orthostatic, with recent h/o pre-syncope - Worse currently, states did not eat before coming to doctor's office today - Denies syncope/LOC episode, vertigo, nausea  I have reviewed and updated the following as appropriate: allergies and current medications  Social Hx: Former smoker (x 10 years, < 0.5ppd), significant second hand smoke hx  Review of Systems  See above HPI     Objective:   Physical Exam  BP 135/79  Pulse 90  Ht 5\' 2"  (1.575 m)  Wt 198 lb (89.812 kg)  BMI 36.21 kg/m2  Gen - chronically ill-appearing, obese, NAD HEENT - PERRL, EOMI, oropharynx clear, mild dry MM Heart - RRR, no murmurs heard Lungs - CTAB, no wheezing, crackles, or rhonchi. Normal work of breathing. Abd - obese, non-tender, stable abdominal distention, +hypoactive BS Ext - generalized tenderness, no erythema or asymmetric edema of calves, +1 peripheral edema, peripheral pulses intact +2  b/l Skin - warm, dry, no rashes Neuro - awake, alert, oriented, grossly non-focal, intact muscle strength 5/5 b/l, intact distal sensation to light touch, normal but cautious gait      Assessment:     See specific A&P problem list for details.      Plan:     See specific A&P problem list for details.

## 2014-02-22 NOTE — Assessment & Plan Note (Addendum)
Stable, no acute distention/ascites - Compliant with meds  Plan: 1. Continue current regimen with Lactulose / Arlyce Harman / Lasix 2. Advised continue decreased Lasix from 40mg  daily to 20mg  daily (due to concern for orthostasis), pt had been still taking whole 40mg  tab 3. Advised on measuring Lactulose dose appropriately and take up to QID 4. Recommend adding Miralax to encourage BMs 5. F/u with GI and [facility] Hepatology

## 2014-02-22 NOTE — Assessment & Plan Note (Signed)
Worsening exertional dyspnea, no CP/tightness - Previous Cardiac evaluation for pre-op clearance in 2014 with stress ECHO not maximal effort  Plan: 1. Referral to Cardiology for further eval DOE, recommend ECHO

## 2014-02-22 NOTE — Patient Instructions (Signed)
Dear Wendy [name], Thank you for coming in to clinic today. It was good to see you!  Today we discussed your Shortness of breath, Hepatitis and Medications. 1. I have refilled some of your medications. Please follow these changes: - Take Lasix 20mg  (half a 40mg  tablet) daily for swelling, if needed you may take a whole 40mg  tablet - Lactulose - measure out the appropriate amount and take 4 times daily (30g or 45 mL), goal is for 1-2 soft bowel movements daily - Start taking Miralax powder (one 17g packet or capful daily) 2. For your shortness of breath, I have referred you to Cardiology, they will see you and I recommend that you get an ECHOcardiogram, and possibly a repeat stress test. 3. Continue to follow-up with the GI Doctors - Dr. Olevia Perches and Carlean Purl - to determine if you need any other treatment for your "Hiatal Hernia" and acid reflux  Please schedule a follow-up appointment with me (Dr. Parks Ranger) in 3-6 months for follow-up.  If you have any other questions or concerns, please feel free to call the clinic to contact me. You may also schedule an earlier appointment if necessary.  However, if your symptoms get significantly worse, please go to the Emergency Department to seek immediate medical attention.  Wendy [name], [place]

## 2014-02-22 NOTE — Assessment & Plan Note (Signed)
Seems subacute worsening - Recent EGD 01/2014, mild antral gastritis, negative H.pylori biopsy results - also noted non-reducible hiatal hernia  Plan: 1. Continue Omeprazole 2. F/u with GI re: hiatal hernia

## 2014-02-28 ENCOUNTER — Other Ambulatory Visit: Payer: Self-pay | Admitting: Family Medicine

## 2014-03-01 MED ORDER — CYCLOBENZAPRINE HCL 10 MG PO TABS: 10.0000 mg | ORAL_TABLET | Freq: Three times a day (TID) | ORAL | Status: DC | PRN

## 2014-03-12 ENCOUNTER — Other Ambulatory Visit: Payer: Self-pay | Admitting: Internal Medicine

## 2014-03-18 ENCOUNTER — Other Ambulatory Visit: Payer: Self-pay | Admitting: Family Medicine

## 2014-03-19 ENCOUNTER — Telehealth: Payer: Self-pay | Admitting: Internal Medicine

## 2014-03-19 ENCOUNTER — Encounter: Payer: Self-pay | Admitting: Internal Medicine

## 2014-03-19 ENCOUNTER — Ambulatory Visit (INDEPENDENT_AMBULATORY_CARE_PROVIDER_SITE_OTHER): Payer: Medicare Other | Admitting: Internal Medicine

## 2014-03-19 ENCOUNTER — Other Ambulatory Visit (INDEPENDENT_AMBULATORY_CARE_PROVIDER_SITE_OTHER): Payer: Medicare Other

## 2014-03-19 VITALS — BP 120/90 | HR 88 | Ht 62.0 in | Wt 199.5 lb

## 2014-03-19 DIAGNOSIS — B182 Chronic viral hepatitis C: Secondary | ICD-10-CM | POA: Diagnosis not present

## 2014-03-19 DIAGNOSIS — K7682 Hepatic encephalopathy: Secondary | ICD-10-CM

## 2014-03-19 DIAGNOSIS — K7460 Unspecified cirrhosis of liver: Secondary | ICD-10-CM | POA: Diagnosis not present

## 2014-03-19 DIAGNOSIS — K5900 Constipation, unspecified: Secondary | ICD-10-CM | POA: Diagnosis not present

## 2014-03-19 DIAGNOSIS — R140 Abdominal distension (gaseous): Secondary | ICD-10-CM

## 2014-03-19 DIAGNOSIS — K449 Diaphragmatic hernia without obstruction or gangrene: Secondary | ICD-10-CM

## 2014-03-19 DIAGNOSIS — K7290 Hepatic failure, unspecified without coma: Secondary | ICD-10-CM

## 2014-03-19 DIAGNOSIS — K7469 Other cirrhosis of liver: Secondary | ICD-10-CM

## 2014-03-19 DIAGNOSIS — I8510 Secondary esophageal varices without bleeding: Secondary | ICD-10-CM

## 2014-03-19 LAB — CBC WITH DIFFERENTIAL/PLATELET
Basophils Absolute: 0 10*3/uL (ref 0.0–0.1)
Basophils Relative: 0.3 % (ref 0.0–3.0)
EOS PCT: 1.6 % (ref 0.0–5.0)
Eosinophils Absolute: 0.1 10*3/uL (ref 0.0–0.7)
HCT: 35.8 % — ABNORMAL LOW (ref 36.0–46.0)
HEMOGLOBIN: 11.8 g/dL — AB (ref 12.0–15.0)
LYMPHS PCT: 18.4 % (ref 12.0–46.0)
Lymphs Abs: 1.5 10*3/uL (ref 0.7–4.0)
MCHC: 33 g/dL (ref 30.0–36.0)
MCV: 114.9 fl — ABNORMAL HIGH (ref 78.0–100.0)
MONOS PCT: 13.4 % — AB (ref 3.0–12.0)
Monocytes Absolute: 1.1 10*3/uL — ABNORMAL HIGH (ref 0.1–1.0)
NEUTROS ABS: 5.2 10*3/uL (ref 1.4–7.7)
NEUTROS PCT: 66.3 % (ref 43.0–77.0)
Platelets: 208 10*3/uL (ref 150.0–400.0)
RBC: 3.12 Mil/uL — AB (ref 3.87–5.11)
RDW: 17.3 % — ABNORMAL HIGH (ref 11.5–15.5)
WBC: 7.9 10*3/uL (ref 4.0–10.5)

## 2014-03-19 LAB — VITAMIN B12: Vitamin B-12: 1387 pg/mL — ABNORMAL HIGH (ref 211–911)

## 2014-03-19 LAB — FERRITIN: Ferritin: 279.1 ng/mL (ref 10.0–291.0)

## 2014-03-19 NOTE — Patient Instructions (Signed)
Increase your Miralax to twice a day.  Your physician has requested that you go to the basement for the following lab work before leaving today: CBC/diff, B12, Ferritin  Dr. Carlean Purl will follow up with Dr. Raymond Gurney about your case.   Today you have been given handouts on Hiatus Hernia and Reflux surgery.   I appreciate the opportunity to care for you.

## 2014-03-19 NOTE — Progress Notes (Signed)
[facility]  Wendy [name]    [id number]    [birth year]    Assessment and Plan/Recommendations:  Paraesophageal Hernia - Will get  Dr. Hanley Seamen opinion about suitability of her  for surgical management of this. She is a Child's B I think and would have increased surgical risk but should most likely have paraesophageal hernia repaired. I think we have surgeon's in [place] that could handle this - if they agree.  Cirrhosis/Hepatic Encephalopathy - On Lactulose + Xifaxan - seems controlled.  Will again defer to Dr. Monica Martinez for f/u HCV - has appt next month re: initiation Tx Constipation - Pt has been taking Miralax once a day.  Increase to BID Anemia - To receive f/u blood work today.   HPI: Wendy [name] is a 53 y.o. female with a history of Paraesophageal Hernia, Cirrhosis and Hepatitis C presents today with worsening GERD symptoms of reflux with some regurgitation.  Pt has been taking prilosec as directed with minimal relief.  She denies caffeine, alcohol, or smoking.  She states she has been very stressed since her back surgery earlier this year has decreased her ADLs significantly.  She states she cannot get around the house easily, dreads going out into public, and is unable to get up onto the exam table today.  She also c/o constipation stating her last BM was Thursday, 03/15/14 despite use of Miralax and Lactulose.  She denies seeing any blood in her stool or regurgitated stomach contents, denies weight loss, fever, or chills. She does have some dysphagia as well as epigastric pain, especially when her GERD is acting up.    Outpatient Encounter Prescriptions as of 03/19/2014  Medication Sig  . alendronate (FOSAMAX) 70 MG tablet Take 1 tablet (70 mg total) by mouth every Thursday. Take with a full glass of water on an empty stomach.  . benzonatate (TESSALON) 100 MG capsule Take 1 capsule (100 mg total) by mouth 2 (two) times daily as needed for cough.  .  calcium-vitamin D (OSCAL-500) 500-400 MG-UNIT per tablet 2 tablets 2 (two) times daily.   . carvedilol (COREG) 6.25 MG tablet Take 1 tablet (6.25 mg total) by mouth 2 (two) times daily with a meal.  . clonazePAM (KLONOPIN) 0.5 MG tablet Take 1 tablet (0.5 mg total) by mouth 2 (two) times daily as needed for anxiety.  . cyclobenzaprine (FLEXERIL) 10 MG tablet Take 1 tablet (10 mg total) by mouth 3 (three) times daily as needed for muscle spasms.  . cycloSPORINE (RESTASIS) 0.05 % ophthalmic emulsion Place 1 drop into both eyes 2 (two) times daily.  Marland Kitchen docusate sodium (COLACE) 100 MG capsule Take 1 capsule (100 mg total) by mouth 2 (two) times daily.  . furosemide (LASIX) 40 MG tablet Take 0.5 tablets (20 mg total) by mouth daily.  Marland Kitchen glycerin adult (GLYCERIN ADULT) 2 G SUPP Place 1 suppository rectally daily as needed for moderate constipation.  . hydrOXYzine (ATARAX/VISTARIL) 25 MG tablet Take 25 mg by mouth 4 (four) times daily as needed for itching.  . lactulose (CHRONULAC) 10 GM/15ML solution Take 45 mLs (30 g total) by mouth 4 (four) times daily as needed (for constipation).  Marland Kitchen loratadine (CLARITIN) 10 MG tablet Take 1 tablet (10 mg total) by mouth daily.  . Olopatadine HCl 0.2 % SOLN Place 1 drop into both eyes daily.  Marland Kitchen omeprazole (PRILOSEC) 40 MG capsule Take 1 capsule (40 mg total) by mouth daily.  . ondansetron (ZOFRAN ODT) 4 MG disintegrating tablet Take 1 tablet (  4 mg total) by mouth every 8 (eight) hours as needed for nausea or vomiting.  . polyethylene glycol (MIRALAX / GLYCOLAX) packet Take 17 g by mouth 2 (two) times daily.  . Prenatal 28-0.8 MG TABS Take 1 tablet by mouth daily.  . QUEtiapine (SEROQUEL) 200 MG tablet Take 200 mg by mouth at bedtime.  . rifaximin (XIFAXAN) 550 MG TABS tablet Take 550 mg by mouth 2 (two) times daily.  Marland Kitchen spironolactone (ALDACTONE) 100 MG tablet Take 1 tablet (100 mg total) by mouth daily.  . traMADol (ULTRAM) 50 MG tablet Take 1 tablet (50 mg total) by  mouth every 12 (twelve) hours as needed for severe pain.  Marland Kitchen amoxicillin (AMOXIL) 500 MG capsule TAKE 4 CAPSULES BY MOUTH 30-60 MINUTES PRIOR TO PROCEDURE  . [DISCONTINUED] omeprazole (PRILOSEC) 20 MG capsule TAKE 1 CAPSULE DAILY 30-60 MIN PRIOR TO BREAKFAST  . [DISCONTINUED] Skin Protectants, Misc. (EUCERIN) cream Apply topically as needed for dry skin.    Allergies as of 03/19/2014 - Review Complete 03/19/2014  Allergen Reaction Noted  . Codeine phosphate Itching 12/24/2005    Past Medical History  Diagnosis Date  . Hepatic cirrhosis due to chronic hepatitis C infection     ETOH causative as well  . Hiatal hernia   . Esophageal stricture     esophageal dysmotility and chronic dysphagia as well  . 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 History of alcohol abuse   . Anxiety and depression   . History of hip fracture   . Allergic rhinitis   . HTN (hypertension)   . Blood transfusion   . Cataract     MILD  . GERD (gastroesophageal reflux disease)   . Osteoporosis   . Ulcer     2007  . Arthritis   . Clotting disorder     prolonged clotting time due to liver disease  . Paraesophageal hernia     Past Surgical History  Procedure Laterality Date  . Orif hip fracture  2010    right x2  . Upper gastrointestinal endoscopy  08/05/2007    esophageal ring, hiatal hernia, portal gastropathy  . Splenectomy      age 66  . Burr hole for subdural hematoma  2004  . Upper gastrointestinal endoscopy  10/14/2011  . Colonoscopy  08/2012    moderatel left colon tics  . Kyphoplasty Bilateral 09/21/2013    Procedure: T12 - L1 KYPHOPLASTY;  Surgeon: Melina Schools, MD;  Location: [facility];  Service: Orthopedics;  Laterality: Bilateral;  . Esophagogastroduodenoscopy (egd) with propofol N/A 01/26/2014    Procedure: ESOPHAGOGASTRODUODENOSCOPY (EGD) WITH PROPOFOL;  Surgeon: Lafayette Dragon, MD;  Location: [facility] ENDOSCOPY;  Service: Endoscopy;  Laterality: N/A;    Family History   Problem Relation Age of Onset  . Heart disease Mother   . Colon cancer Neg Hx   . Other Daughter     chromosome abnormalit  . Other Daughter     micropthalmia    History   Social History  . Marital Status: Widowed    Spouse Name: N/A    Number of Children: 2  . Years of Education: N/A   Occupational History  . Disabled    Social History Main Topics  . Smoking status: Former Smoker -- 0.25 packs/day for 10 years    Types: Cigarettes    Quit date: 12/23/2008  . Smokeless tobacco: Never Used  . Alcohol Use: No     Comment: goes to AA  .  Drug Use: No  . Sexual Activity: No   Other Topics Concern  . Not on file   Social History Narrative   Just before her husband died, she had a miscarriage and started to drink beer heavily.  She quit secondary to her liver disease and has been quit for several years.  She denies the use of drugs - prescription or otherwise.      Review of systems: Positive for: acid reflux, regurgitation, dysphagia, and constipation. All other ROS negative or as per HPI  Physical Exam: Limited by patients inability to get on exam table.  Performed in seated position. BP 120/90  Pulse 88  Ht 5\' 2"  (1.575 m)  Wt 199 lb 8 oz (90.493 kg)  BMI 36.48 kg/m2 Constitutional: Obese Female in NAD Eyes: anicteric Mouth: oral and posterior pharynx free of lesions Neck: supple, no mass or thyromegaly Lungs: clear to auscultation bilaterally, Unlabored and Even. Cardiovascular: S1S2 with regular rate and rhythm, no rubs murmurs or gallops appreciated. Abdomen: Limited by pt position.  Soft, Non distended.  Mildly tender > RUQ.  Extremities: no lower extremity edema. Negative for flapping tremors Skin: no rash. Spider angiomata present on L chest. Neuro: alert and oriented to person, place, and time.  Lost train of thought several times during interview. Psych: normal mood and affect  Data Reviewed: [facility] liver notes Ba swallow including images EGD Hospital  notes 2015   Mercer Pod PA Student 03/19/2014 11:23 AM    I have personally seen the patient, reviewed and repeated key elements of the history and physical and participated in formation of the assessment and plan the student has documented.   HEPATIC CIRRHOSIS WITH HCV AND HX OF ALCOHOL Stable overall To start Harvoni for HCV  Chronic hepatitis C - genotype 1A To start Harvoni soon  Esophageal varices in cirrhosis - trace Trace on recent EGD On Carvedilol  Unspecified constipation Make and take MiraLAx bid May need to increase lactulose  Hepatic encephalopathy syndrome No clinical signs today Stay on Xifaxaan and lactulose and check NH3    Gatha Mayer, MD, [facility]

## 2014-03-19 NOTE — Telephone Encounter (Signed)
Patient has gained a significant amount of weight.  Wendy [name] is wondering if there is a specific diet that Dr. Carlean Purl recommends.  Wendy [name] forgot to ask this am at the appt?

## 2014-03-19 NOTE — Assessment & Plan Note (Signed)
No clinical signs today Stay on Xifaxaan and lactulose and check NH3

## 2014-03-19 NOTE — Assessment & Plan Note (Signed)
Trace on recent EGD On Carvedilol

## 2014-03-19 NOTE — Assessment & Plan Note (Signed)
Stable overall To start Vivian for HCV

## 2014-03-19 NOTE — Assessment & Plan Note (Addendum)
Make and take MiraLAx bid May need to increase lactulose

## 2014-03-19 NOTE — Assessment & Plan Note (Signed)
To start Payson soon

## 2014-03-20 ENCOUNTER — Other Ambulatory Visit: Payer: Self-pay | Admitting: *Deleted

## 2014-03-20 ENCOUNTER — Encounter: Payer: Self-pay | Admitting: Internal Medicine

## 2014-03-20 MED ORDER — LORATADINE 10 MG PO TABS
10.0000 mg | ORAL_TABLET | Freq: Every day | ORAL | Status: DC
Start: 2014-03-20 — End: 2014-03-30

## 2014-03-20 NOTE — Progress Notes (Signed)
Patient ID: Wendy [name], female   DOB: November 06, 1960, 53 y.o.   MRN: [id number] Faxed letter from Dr. Carlean Purl to Dr. Claybon Jabs , fax # [phone], phone [phone]. Received confirmation that it went thru.

## 2014-03-20 NOTE — Telephone Encounter (Signed)
Do an abd [place] re cirrhosis and abd distention to make sure no ascites

## 2014-03-21 ENCOUNTER — Other Ambulatory Visit: Payer: Self-pay | Admitting: Family Medicine

## 2014-03-21 NOTE — Telephone Encounter (Signed)
Patient notified or recommendations of Dr. Carlean Purl.  She verbalized understanding to arrive at [facility] 03/26/14 8:30 and to be 6 hours NPO

## 2014-03-22 NOTE — Progress Notes (Signed)
Quick Note:  Labs ok ______ 

## 2014-03-23 NOTE — Telephone Encounter (Signed)
Pt needs refill on tessalon pearls Same pharmacy

## 2014-03-26 ENCOUNTER — Other Ambulatory Visit: Payer: Self-pay

## 2014-03-26 ENCOUNTER — Ambulatory Visit (HOSPITAL_COMMUNITY)
Admission: RE | Admit: 2014-03-26 | Discharge: 2014-03-26 | Disposition: A | Discharge status: Home / Self Care | Payer: Medicare Other | Source: Ambulatory Visit | Attending: Internal Medicine | Admitting: Internal Medicine

## 2014-03-26 ENCOUNTER — Telehealth: Payer: Self-pay | Admitting: Internal Medicine

## 2014-03-26 DIAGNOSIS — R140 Abdominal distension (gaseous): Secondary | ICD-10-CM

## 2014-03-26 DIAGNOSIS — K7460 Unspecified cirrhosis of liver: Secondary | ICD-10-CM | POA: Diagnosis not present

## 2014-03-26 DIAGNOSIS — R188 Other ascites: Secondary | ICD-10-CM | POA: Insufficient documentation

## 2014-03-26 DIAGNOSIS — K7469 Other cirrhosis of liver: Secondary | ICD-10-CM

## 2014-03-26 NOTE — Progress Notes (Signed)
Quick Note:  Aware of ascites No signs of tumor She needs a BMET to recheck kidney fx (last one in June) ______

## 2014-03-26 NOTE — Telephone Encounter (Signed)
See imaging and labs for information

## 2014-03-27 ENCOUNTER — Telehealth: Payer: Self-pay | Admitting: Family Medicine

## 2014-03-27 NOTE — Telephone Encounter (Signed)
Pt called because the pharmacy said that we still have sent in a request for them to refill her Flexeril and Benzonatate. jw

## 2014-03-28 ENCOUNTER — Other Ambulatory Visit (INDEPENDENT_AMBULATORY_CARE_PROVIDER_SITE_OTHER): Payer: Medicare Other

## 2014-03-28 DIAGNOSIS — K7460 Unspecified cirrhosis of liver: Secondary | ICD-10-CM | POA: Diagnosis not present

## 2014-03-28 LAB — BASIC METABOLIC PANEL WITH GFR
BUN: 13 mg/dL (ref 6–23)
CO2: 26 meq/L (ref 19–32)
Calcium: 8.8 mg/dL (ref 8.4–10.5)
Chloride: 103 meq/L (ref 96–112)
Creatinine, Ser: 0.9 mg/dL (ref 0.4–1.2)
GFR: 68.41 mL/min (ref >60.00)
Glucose, Bld: 105 mg/dL — ABNORMAL HIGH (ref 70–99)
Potassium: 4 meq/L (ref 3.5–5.1)
Sodium: 135 meq/L (ref 135–145)

## 2014-03-28 MED ORDER — CYCLOBENZAPRINE HCL 10 MG PO TABS: 10.0000 mg | ORAL_TABLET | Freq: Three times a day (TID) | ORAL | Status: DC | PRN

## 2014-03-28 MED ORDER — BENZONATATE 100 MG PO CAPS: ORAL_CAPSULE | ORAL | Status: DC

## 2014-03-28 NOTE — Progress Notes (Signed)
Quick Note:  Kidney fx normal Arrange for US guided paracentesis up to 5 L Albumin please (IV)  Cell count and diff on fluid ______

## 2014-03-28 NOTE — Telephone Encounter (Signed)
Reviewed chart. Last rx + refills for Flexeril (sent 03/01/14) and Benzonatate (sent 03/22/14). Went ahead and sent out additional rx to [facility], [place]) listed as patient's pharmacy. I was not aware that there had been a prior request for these meds that was pending.  Please notify patient that these refills should be sent in, and to check with pharmacy.  [name], [place], PGY-2

## 2014-03-29 ENCOUNTER — Telehealth: Payer: Self-pay | Admitting: Internal Medicine

## 2014-03-29 DIAGNOSIS — R188 Other ascites: Secondary | ICD-10-CM

## 2014-03-29 NOTE — Telephone Encounter (Signed)
Rx called [facility] again to [facility] as written, patient informed.

## 2014-03-29 NOTE — Telephone Encounter (Signed)
Pt called because the pharmacy stated that they did not have a refill request for her flexeril. Can we call this in again please. jw

## 2014-03-30 ENCOUNTER — Other Ambulatory Visit: Payer: Self-pay

## 2014-03-30 ENCOUNTER — Encounter (HOSPITAL_COMMUNITY): Payer: Self-pay | Admitting: Pharmacy Technician

## 2014-03-30 DIAGNOSIS — R188 Other ascites: Secondary | ICD-10-CM

## 2014-03-30 NOTE — Telephone Encounter (Signed)
See lab results for additional details.  

## 2014-03-30 NOTE — Addendum Note (Signed)
Addended by: Marlon Pel on: 03/30/2014 09:36 AM   Modules accepted: Orders

## 2014-03-30 NOTE — Telephone Encounter (Signed)
Wendy [name] is requesting rx for pain medication to take after she have her procedure for removing fluid from her stomach on Wednesday... Stated that the doctor's office that is responsible for performing this do not prescribe meds at all. They told her to get from her provider.

## 2014-04-02 ENCOUNTER — Telehealth: Payer: Self-pay | Admitting: Family Medicine

## 2014-04-02 NOTE — Telephone Encounter (Signed)
Called and LVM for patient to pick up medication at pharmacy. Shelly

## 2014-04-02 NOTE — Telephone Encounter (Signed)
Called patient, spoke with Wendy [name] to discuss concern from earlier phone call. She reported that she is scheduled for abdominal paracentesis to remove excess fluid on this Wednesday 04/04/14. She states that she is followed by Dr. Carlean Purl, and advised to have paracentesis done, and then they would evaluate her for potential future hiatal hernia surgery for reflux. She calls with complaints of abdominal, flank, and back pain. Previously had been taking Tramadol with relief, now some increased pain, and advised by GI office to follow-up with PCP for pain control. Patient denies any fevers/chills, worsening nausea / vomiting. Advised patient that she is currently scheduled for follow-up on Wednesday (after paracentesis procedure), recommended that patient needs to be evaluated for her pain in office prior to prescribing different pain medications, and that she could call to schedule an appointment on Tuesday if pain worsens. If significant worsening or other acute concerns, may go to Urgent Care vs ER. Patient understood, and does not plan to schedule follow-up and agrees to wait until scheduled apt on Wednesday.  Wendy [name], Wendy [name], PGY-2

## 2014-04-02 NOTE — Telephone Encounter (Signed)
Patient requesting pain meds due to the procedure she is having later this week. States the Tramadol does not work. Please advise.

## 2014-04-04 ENCOUNTER — Encounter: Payer: Self-pay | Admitting: Family Medicine

## 2014-04-04 ENCOUNTER — Ambulatory Visit (INDEPENDENT_AMBULATORY_CARE_PROVIDER_SITE_OTHER): Payer: Medicare Other | Admitting: Family Medicine

## 2014-04-04 ENCOUNTER — Ambulatory Visit (HOSPITAL_COMMUNITY)
Admission: RE | Admit: 2014-04-04 | Discharge: 2014-04-04 | Disposition: A | Discharge status: Home / Self Care | Payer: Medicare Other | Source: Ambulatory Visit | Attending: Internal Medicine | Admitting: Internal Medicine

## 2014-04-04 VITALS — BP 144/77 | HR 163 | Temp 98.2°F | Ht 62.0 in

## 2014-04-04 VITALS — BP 115/62 | HR 87 | Temp 98.3°F | Resp 20

## 2014-04-04 DIAGNOSIS — Z8781 Personal history of (healed) traumatic fracture: Secondary | ICD-10-CM

## 2014-04-04 DIAGNOSIS — M899 Disorder of bone, unspecified: Secondary | ICD-10-CM

## 2014-04-04 DIAGNOSIS — K7460 Unspecified cirrhosis of liver: Secondary | ICD-10-CM

## 2014-04-04 DIAGNOSIS — M549 Dorsalgia, unspecified: Secondary | ICD-10-CM

## 2014-04-04 DIAGNOSIS — G8929 Other chronic pain: Secondary | ICD-10-CM | POA: Diagnosis not present

## 2014-04-04 DIAGNOSIS — R188 Other ascites: Secondary | ICD-10-CM | POA: Diagnosis not present

## 2014-04-04 DIAGNOSIS — T148XXA Other injury of unspecified body region, initial encounter: Secondary | ICD-10-CM | POA: Diagnosis not present

## 2014-04-04 DIAGNOSIS — B1920 Unspecified viral hepatitis C without hepatic coma: Secondary | ICD-10-CM | POA: Insufficient documentation

## 2014-04-04 DIAGNOSIS — M949 Disorder of cartilage, unspecified: Secondary | ICD-10-CM

## 2014-04-04 DIAGNOSIS — M8580 Other specified disorders of bone density and structure, unspecified site: Secondary | ICD-10-CM

## 2014-04-04 DIAGNOSIS — IMO0002 Reserved for concepts with insufficient information to code with codable children: Secondary | ICD-10-CM

## 2014-04-04 LAB — BODY FLUID CELL COUNT WITH DIFFERENTIAL
Lymphs, Fluid: 18 %
Monocyte-Macrophage-Serous Fluid: 68 % (ref 50–90)
Neutrophil Count, Fluid: 14 % (ref 0–25)
Total Nucleated Cell Count, Fluid: 97 cu mm (ref 0–1000)

## 2014-04-04 MED ORDER — SODIUM CHLORIDE 0.9 % IV SOLN
INTRAVENOUS | Status: DC
  Administered 2014-04-04: 13:00:00 via INTRAVENOUS

## 2014-04-04 MED ORDER — ALBUMIN HUMAN 25 % IV SOLN
50.0000 g | Freq: Once | INTRAVENOUS | Status: AC
  Administered 2014-04-04: 50 g via INTRAVENOUS
  Filled 2014-04-04: qty 200

## 2014-04-04 NOTE — Progress Notes (Signed)
Patient ID: Wendy [name], female   DOB: [birth year], 53 y.o.   MRN: [id number] Subjective:    HPI  ABDOMINAL PAIN / CIRRHOSIS / CHRONIC PAIN: - s/p US guided paracentesis today 04/04/14, 5L fluid removed, fluid sent for cell counts, ordered by patient's GI physician Dr. Carlean Purl - Significant improved abdominal pain, still persistent chronic LBP, left > right - Currently taking Tramadol 50mg  takes 2 daily without relief - chronic LBP, without radiating, lower and left lower, improves with rest - Denies fevers/chills, saddle anesthesia, urinary retention or incontinence  Mood Disorder: Hx previously seen Dr. Gwenlyn Saran about 1 year ago (only seen 1-2x) - Interested in seeing a regular counselor, recent history of sister and husband passing, daughter committed suicide - Admits feelings of sadness, depression, crying spells, guilt  - Tried Zoloft and multiple other medications - Currently on Seroquel  I have reviewed and updated the following as appropriate: allergies and current medications  Social Hx: Former smoker (x 10 years, < 0.5ppd), significant second hand smoke hx  Review of Systems  See above HPI     Objective:   Physical Exam  BP 144/77  Pulse 163  Temp(Src) 98.2 F (36.8 C) (Oral)  Ht 5\' 2"  (1.575 m)  Gen - chronically ill-appearing, obese, sitting on wheelchair, NAD Heart - RRR, no murmurs heard Lungs - CTAB, no wheezing, crackles, or rhonchi. Normal work of breathing. Abd - obese, minimal abdominal tenderness, improved abdominal distention, +active BS MSK - non-tender over Lumbar spinous processes, palpable mild tenderness lower left paraspinal muscles without hypertonicity Ext - generalized tenderness, no erythema or asymmetric edema of calves, +1 peripheral edema, peripheral pulses intact +2 b/l Skin - warm, dry, no rashes Neuro - awake, alert, oriented, grossly non-focal     Assessment:     See specific A&P problem list for details.      Plan:     See  specific A&P problem list for details.

## 2014-04-04 NOTE — Patient Instructions (Signed)
Dear Wendy [name], Thank you for coming in to clinic today. It was good to see you again!  Today we discussed your Back Pain and Recent Paracentesis Procedure. 1. For your Back pain, please try the following pain regimen:  - Tylenol (safe to use appropriate dose) 500 mg tablets may take 4 daily (take no more than 2000mg  daily) - you can take 2 extra str tabs twice daily or one tab every 6 hours  - Tramadol - continue use  - Flexeril - continue use  - May try topical heating pad as needed 2. I ordered a DEXA Scan to check to see if you have osteoporosis. I'm thinking that this is highly likely with your history of fractures. - we will discuss these results in the future  Please schedule a follow-up appointment with me (Dr. Parks Ranger) in 1-2 months to discuss results of DEXA Scan.  If you have any other questions or concerns, please feel free to call the clinic to contact me. You may also schedule an earlier appointment if necessary.  However, if your symptoms get significantly worse, please go to the Emergency Department to seek immediate medical attention.  Wendy [name], [place]

## 2014-04-04 NOTE — Discharge Instructions (Signed)
Albumin injection °What is this medicine? °ALBUMIN (al BYOO min) is used to treat or prevent shock following serious injury, bleeding, surgery, or burns by increasing the volume of blood plasma. This medicine can also replace low blood protein. °This medicine may be used for other purposes; ask your health care provider or pharmacist if you have questions. °COMMON BRAND NAME(S): Albuked, Albumarc, Albuminar, Albutein, Buminate, Flexbumin, Kedbumin, Macrotec, Plasbumin °What should I tell my health care provider before I take this medicine? °They need to know if you have any of the following conditions: °-anemia °-heart disease °-kidney disease °-an unusual or allergic reaction to albumin, other medicines, foods, dyes, or preservatives °-pregnant or trying to get pregnant °-breast-feeding °How should I use this medicine? °This medicine is for infusion into a vein. It is given by a health-care professional in a hospital or clinic. °Talk to your pediatrician regarding the use of this medicine in children. While this drug may be prescribed for selected conditions, precautions do apply. °Overdosage: If you think you have taken too much of this medicine contact a poison control center or emergency room at once. °NOTE: This medicine is only for you. Do not share this medicine with others. °What if I miss a dose? °This does not apply. °What may interact with this medicine? °Interactions are not expected. °This list may not describe all possible interactions. Give your health care provider a list of all the medicines, herbs, non-prescription drugs, or dietary supplements you use. Also tell them if you smoke, drink alcohol, or use illegal drugs. Some items may interact with your medicine. °What should I watch for while using this medicine? °Your condition will be closely monitored while you receive this medicine. °Some products are derived from human plasma, and there is a small risk that these products may contain certain  types of virus or bacteria. All products are processed to kill most viruses and bacteria. If you have questions concerning the risk of infections, discuss them with your doctor or health care professional. °What side effects may I notice from receiving this medicine? °Side effects that you should report to your doctor or health care professional as soon as possible: °-allergic reactions like skin rash, itching or hives, swelling of the face, lips, or tongue °-breathing problems °-changes in heartbeat °-fever, chills °-pain, redness or swelling at the injection site °-signs of viral infection including fever, drowsiness, chills, runny nose followed in about 2 weeks by a rash and joint pain °-tightness in the chest °Side effects that usually do not require medical attention (report to your doctor or health care professional if they continue or are bothersome): °-increased salivation °-nausea, vomiting °This list may not describe all possible side effects. Call your doctor for medical advice about side effects. You may report side effects to FDA at 1-800-FDA-1088. °Where should I keep my medicine? °This does not apply. You will not be given this medicine to store at home. °NOTE: This sheet is a summary. It may not cover all possible information. If you have questions about this medicine, talk to your doctor, pharmacist, or health care provider. °© 2015, [employer]vier/Gold [employer]. (2007-11-03 10:18:55) ° °

## 2014-04-04 NOTE — Procedures (Addendum)
US guided diagnostic/therapeutic paracentesis performed yielding 5 liters (maximum ordered) clear, yellow fluid. A portion of the fluid was sent to the lab for cell count and differential. No immediate complications. The pt will receive IV albumin postprocedure.

## 2014-04-04 NOTE — Assessment & Plan Note (Signed)
Order repeat DEXA, last 2011 with osteopenia

## 2014-04-04 NOTE — Assessment & Plan Note (Signed)
Persistent chronic LBP, L>R in setting of hx T12-L1 chronic compression fracture s/p kyphoplasty 08/2013 due to osteopenia. Also X-rays with lumbar DJD - Currently on Tramadol, previously on Norco / Oxycodone (since discontinued)  Plan: 1. Recommend safe dosage of Tylenol up to 2000mg  daily 2. Continue Tramadol PRN 3. Chart review with Dexa Scan 2011, and s/p compression fractures / hip fracture, considered pt osteopenia / high risk osteoporosis 4. Repeat DEXA scan 5. RTC to discuss results and consider bisophosonate, previously treated with, currently on Calcium-Vit D, Calcitonin

## 2014-04-04 NOTE — Assessment & Plan Note (Signed)
S/p paracentesis today 04/04/14, removed 5 L - Improved abdominal pain - Fluid sent for cell counts  Plan: 1. Follow-up with GI

## 2014-04-05 LAB — PATHOLOGIST SMEAR REVIEW

## 2014-04-05 NOTE — Progress Notes (Signed)
Quick Note:  No infection on fluid Please cc Dr. Monica Martinez on this result and [place] report ______

## 2014-04-06 ENCOUNTER — Telehealth: Payer: Self-pay | Admitting: Family Medicine

## 2014-04-06 MED ORDER — CLONAZEPAM 0.5 MG PO TABS: 0.5000 mg | ORAL_TABLET | Freq: Two times a day (BID) | ORAL | Status: DC | PRN

## 2014-04-06 NOTE — Telephone Encounter (Signed)
Was seen earlier this week by Dr. Parks Ranger but did not received the RX for clonazePAM. Please call once ready for pick up.

## 2014-04-06 NOTE — Telephone Encounter (Signed)
Pt informed that RX was called in to the pharmacy and should be ready this afternoon. [place], [place] CMA

## 2014-04-06 NOTE — Telephone Encounter (Signed)
Reviewed chart. Last seen for OV 04/04/14, however patient did not request refill at that time. [facility]oned [facility] Klonopin 0.5mg  BID PRN (#60, 2 refills). Pharmacy unable to notify patient when available.  Could you please call Wendy [name] to notify her that rx will be ready for pick-up at pharmacy later this afternoon?  Thank you, Wendy [name], [place], PGY-2

## 2014-04-11 ENCOUNTER — Ambulatory Visit (INDEPENDENT_AMBULATORY_CARE_PROVIDER_SITE_OTHER): Payer: Medicare Other | Admitting: Cardiology

## 2014-04-11 ENCOUNTER — Encounter: Payer: Self-pay | Admitting: Cardiology

## 2014-04-11 VITALS — BP 118/78 | HR 85 | Ht 61.5 in | Wt 190.0 lb

## 2014-04-11 DIAGNOSIS — R188 Other ascites: Secondary | ICD-10-CM

## 2014-04-11 DIAGNOSIS — I10 Essential (primary) hypertension: Secondary | ICD-10-CM | POA: Diagnosis not present

## 2014-04-11 DIAGNOSIS — R0989 Other specified symptoms and signs involving the circulatory and respiratory systems: Secondary | ICD-10-CM

## 2014-04-11 DIAGNOSIS — R0600 Dyspnea, unspecified: Secondary | ICD-10-CM

## 2014-04-11 DIAGNOSIS — R0609 Other forms of dyspnea: Secondary | ICD-10-CM | POA: Diagnosis not present

## 2014-04-11 DIAGNOSIS — K7460 Unspecified cirrhosis of liver: Secondary | ICD-10-CM

## 2014-04-11 NOTE — Patient Instructions (Signed)
Your physician has requested that you have an echocardiogram. Echocardiography is a painless test that uses sound waves to create images of your heart. It provides your doctor with information about the size and shape of your heart and how well your heart's chambers and valves are working. This procedure takes approximately one hour. There are no restrictions for this procedure.  Your physician recommends that you schedule a follow-up appointment in: AS NEEDED  Your physician recommends that you continue on your current medications as directed. Please refer to the Current Medication list given to you today.

## 2014-04-11 NOTE — Telephone Encounter (Signed)
Patient had called for Clonazepam rx refill and according to note it was phoned in, but patient said it wasn't sent due to pharmacy.  Please contact and inform patient when done.

## 2014-04-11 NOTE — Progress Notes (Signed)
Wendy [name] Date of Birth:  [birth year] Wendy [name] [address] [place] Wendy [name], Wendy [name]  [address] [phone]        Fax   [phone]   History of Present Illness: This 53 year old woman is seen in consultation at the request of Dr.Karamalegos of the [facility].  She is seen because of dyspnea.  The patient has a past history of cirrhosis.  She has a history of hepatitis C.  She has a history of previous hepatic encephalopathy treated at [facility] several years ago.  She was recently seen by Dr. Carlean Purl here in [place] and underwent a diagnostic and therapeutic paracentesis, results pending.  She has a history of esophageal varices. She does not have any history of known heart disease.  She had an attempted dobutamine echo stress test on 12/27/12 which was nondiagnostic because adequate heart rate could not be obtained.  She subsequently underwent a Lexi scan Myoview on 05/29/13 which showed no ischemia and showed an ejection fraction of 79%.  That study was done for preoperative cardiac clearance prior to orthopedic surgery by Dr. Rolena Infante. The patient denies any chest pain or signs or symptoms of angina pectoris.  She does have exertional dyspnea.  She has significant obesity and significant abdominal distention secondary to ascites contributing to her dyspnea.  She has a history of hypertension.  There is no history of prior myocardial infarction.  The patient denies any awareness of irregular heart rate or racing of her heart.  Because of her abdominal distention she has to sleep on 3 pillows. The patient smokes a few cigarettes and is trying to quit.  She has not drunk alcohol since 1999. Her family history reveals her father is living at age 36 and is in good health. Her mother died in her 1s of coronary disease  Current Outpatient Prescriptions  Medication Sig Dispense Refill  . alendronate (FOSAMAX) 70 MG tablet Take 70 mg by mouth.      .  calcitonin, salmon, (MIACALCIN/FORTICAL) 200 UNIT/ACT nasal spray Place 1 spray into alternate nostrils daily.      . calcium-vitamin D (OSCAL-500) 500-400 MG-UNIT per tablet Take 2 tablets by mouth 2 (two) times daily.       . carvedilol (COREG) 6.25 MG tablet Take 6.25 mg by mouth every morning.      . clonazePAM (KLONOPIN) 0.5 MG tablet Take 1 tablet (0.5 mg total) by mouth 2 (two) times daily as needed for anxiety.  60 tablet  2  . cyclobenzaprine (FLEXERIL) 10 MG tablet Take 1 tablet (10 mg total) by mouth 3 (three) times daily as needed for muscle spasms.  30 tablet  1  . cycloSPORINE (RESTASIS) 0.05 % ophthalmic emulsion Place 1 drop into both eyes 2 (two) times daily as needed (For dry eyes.).      Marland Kitchen docusate sodium (COLACE) 100 MG capsule Take 1 capsule (100 mg total) by mouth 2 (two) times daily.  50 capsule  0  . furosemide (LASIX) 40 MG tablet Take 20 mg by mouth every morning.      Marland Kitchen glycerin adult (GLYCERIN ADULT) 2 G SUPP Place 1 suppository rectally daily as needed for moderate constipation.  10 each  0  . hydrOXYzine (ATARAX/VISTARIL) 25 MG tablet Take 25 mg by mouth 4 (four) times daily as needed for itching.      . lactulose (CHRONULAC) 10 GM/15ML solution Take 45 mLs (30 g total) by mouth 4 (four) times daily  as needed (for constipation).  960 mL  6  . loratadine (CLARITIN) 10 MG tablet Take 10 mg by mouth every morning.      . Olopatadine HCl 0.2 % SOLN Place 1 drop into both eyes every morning.       Marland Kitchen omeprazole (PRILOSEC) 40 MG capsule Take 40 mg by mouth every morning.      . ondansetron (ZOFRAN ODT) 4 MG disintegrating tablet Take 1 tablet (4 mg total) by mouth every 8 (eight) hours as needed for nausea or vomiting.  50 tablet  0  . Polyethylene Glycol 3350 (MIRALAX PO) Take 17 g by mouth 2 (two) times daily as needed (For constipation.).      Marland Kitchen Prenatal 28-0.8 MG TABS Take 1 tablet by mouth daily.      . QUEtiapine (SEROQUEL) 200 MG tablet Take 200 mg by mouth at bedtime.       . rifaximin (XIFAXAN) 550 MG TABS tablet Take 550 mg by mouth 2 (two) times daily.      Marland Kitchen spironolactone (ALDACTONE) 100 MG tablet Take 100 mg by mouth every morning.      . traMADol (ULTRAM) 50 MG tablet Take 50 mg by mouth every 12 (twelve) hours as needed (For pain.).       No current facility-administered medications for this visit.    Allergies  Allergen Reactions  . Codeine Phosphate Itching    Patient Active Problem List   Diagnosis Date Noted  . Osteopenia 04/04/2014  . Esophageal varices in cirrhosis - trace 03/19/2014  . Anemia 02/01/2014  . Chronic kidney disease, unspecified 02/01/2014  . Chronic back pain 02/01/2014  . Stricture and stenosis of esophagus 01/26/2014  . Fall at home 01/24/2014  . Near syncope 01/12/2014  . Loss of weight 01/12/2014  . Unspecified constipation 11/18/2013  . Compression fracture 09/21/2013  . Atypical chest pain 07/26/2013  . Ascites 07/19/2013  . Burn 07/03/2013  . Dyspnea 02/03/2013  . Abnormal stress echo 01/19/2013  . Tobacco abuse 10/22/2012  . Allergic conjunctivitis 06/19/2012  . Skin lesion 06/19/2012  . Lumbar spondylosis 12/24/2011  . Obesity (BMI 35.0-39.9 without comorbidity) 12/08/2011  . Right hip pain 12/01/2011  . Chronic hepatitis C - genotype 1A 10/06/2011  . GERD (gastroesophageal reflux disease) 10/06/2011  . Hepatic encephalopathy syndrome 07/28/2011  . Back pain 04/07/2011  . 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 THROMBOCYTOPENIA 10/21/2006  . DEPRESSIVE DISORDER, NOS 10/21/2006  . HYPERTENSION, BENIGN SYSTEMIC 10/21/2006  . HEPATIC CIRRHOSIS WITH HCV AND HX OF ALCOHOL 10/21/2006    History  Smoking status  . Former Smoker -- 0.25 packs/day for 10 years  . Types: Cigarettes  . Quit date: 12/23/2008  Smokeless tobacco  . Never Used    History  Alcohol Use No    Comment: goes to AA    Family History  Problem Relation Age of Onset  . Heart disease Mother   . Colon cancer Neg Hx     . Other Daughter     chromosome abnormalit  . Other Daughter     micropthalmia    Review of Systems: Constitutional: no fever chills diaphoresis or fatigue or change in weight.  Head and neck: no hearing loss, no epistaxis, no photophobia or visual disturbance. Respiratory: Positive for exertional dyspnea Cardiovascular: No chest pain peripheral edema, palpitations. Gastrointestinal: No abdominal distention, no abdominal pain, no change in bowel habits hematochezia or melena. Genitourinary: No dysuria, no frequency, no urgency, no nocturia. Musculoskeletal:No arthralgias, no back  pain, no gait disturbance or myalgias. Neurological: No dizziness, no headaches, no numbness, no seizures, no syncope, no weakness, no tremors. Hematologic: No lymphadenopathy, no easy bruising. Psychiatric: No confusion, no hallucinations, no sleep disturbance.    Physical Exam: Filed Vitals:   04/11/14 1017  BP: 118/78  Pulse: 85   the general appearance reveals a middle-aged woman in no distress.The head and neck exam reveals pupils equal and reactive.  Extraocular movements are full.  There is no scleral icterus.  The mouth and pharynx are normal.  The neck is supple.  The carotids reveal no bruits.  The jugular venous pressure is normal.  The  thyroid is not enlarged.  There is no lymphadenopathy.  The chest is clear to percussion and auscultation.  There are no rales or rhonchi.  Expansion of the chest is symmetrical.  The precordium is quiet.  The first heart sound is normal.  The second heart sound is physiologically split.  There is no murmur gallop rub or click.  There is no abnormal lift or heave.  The abdomen is distended with ascites  The bowel sounds are normal.  The liver and spleen are not felt.  There are no abdominal masses.  There are no abdominal bruits.  Extremities reveal good pedal pulses.  There is no phlebitis or edema.  There is no cyanosis or clubbing.  Strength is normal and symmetrical  in all extremities.  There is no lateralizing weakness.  There are no sensory deficits.  The skin is warm and dry.  There is no rash.  EKG shows normal sinus rhythm and no ischemic changes.  There are narrow Q waves in 3 and aVF which are nondiagnostic.   Assessment / Plan: 1. dyspnea on exertion probably secondary to her severe ascites 2. history of cirrhosis 3. history of hepatitis C 4. chronic back pain  Disposition: We will get a two-dimensional echocardiogram to evaluate left ventricular systolic and diastolic function in terms of her dyspnea.  She has had a normal nuclear stress test less than a year ago so there is no indication for additional ischemic testing.  I suspect that her dyspnea is most likely secondary to her chronic liver disease and her ascites. Thanks for the opportunity to see this pleasant woman with you

## 2014-04-13 NOTE — Telephone Encounter (Signed)
Reviewed chart. Discussed with Wendy [name] and [facility]. Patient has active rx for Clonazepam [facility] day [facility], however unable to pick-up due to < 28 days since last picked up (03/26/14). Pharmacy states that this rx can be filled soonest on 04/24/14. No further action needed at this time. Additionally, reviewed current Tramadol rx, has existing rx with refills, soonest can fill is 04/17/14. Patient is scheduled for follow-up on 04/25/14.  [name], [place], PGY-2

## 2014-04-13 NOTE — Telephone Encounter (Signed)
Contacted pharmacy and was told that patient had a 30 day prescription filled and picked up on 03/27/2014. Will forward to MD for next step Wendy [name]

## 2014-04-16 ENCOUNTER — Other Ambulatory Visit: Payer: Self-pay | Admitting: Family Medicine

## 2014-04-17 ENCOUNTER — Other Ambulatory Visit: Payer: Medicare Other

## 2014-04-18 ENCOUNTER — Ambulatory Visit (HOSPITAL_COMMUNITY): Payer: Medicare Other | Attending: Cardiology | Admitting: Radiology

## 2014-04-18 DIAGNOSIS — J90 Pleural effusion, not elsewhere classified: Secondary | ICD-10-CM | POA: Diagnosis not present

## 2014-04-18 DIAGNOSIS — B1920 Unspecified viral hepatitis C without hepatic coma: Secondary | ICD-10-CM | POA: Diagnosis not present

## 2014-04-18 DIAGNOSIS — Z87891 Personal history of nicotine dependence: Secondary | ICD-10-CM | POA: Diagnosis not present

## 2014-04-18 DIAGNOSIS — N189 Chronic kidney disease, unspecified: Secondary | ICD-10-CM | POA: Insufficient documentation

## 2014-04-18 DIAGNOSIS — R55 Syncope and collapse: Secondary | ICD-10-CM | POA: Diagnosis not present

## 2014-04-18 DIAGNOSIS — R0602 Shortness of breath: Secondary | ICD-10-CM | POA: Insufficient documentation

## 2014-04-18 DIAGNOSIS — E669 Obesity, unspecified: Secondary | ICD-10-CM | POA: Diagnosis not present

## 2014-04-18 DIAGNOSIS — I129 Hypertensive chronic kidney disease with stage 1 through stage 4 chronic kidney disease, or unspecified chronic kidney disease: Secondary | ICD-10-CM | POA: Insufficient documentation

## 2014-04-18 DIAGNOSIS — R0600 Dyspnea, unspecified: Secondary | ICD-10-CM

## 2014-04-18 DIAGNOSIS — R188 Other ascites: Secondary | ICD-10-CM | POA: Diagnosis not present

## 2014-04-18 NOTE — Progress Notes (Signed)
Echocardiogram performed.  

## 2014-04-19 ENCOUNTER — Telehealth: Payer: Self-pay

## 2014-04-19 NOTE — Telephone Encounter (Signed)
pt aware of echo results.The echo was satisfactory.  Vigorous LV systolic function.pt verbalized understanding.

## 2014-04-19 NOTE — Telephone Encounter (Signed)
Message copied by Lamar Laundry on Thu Apr 19, 2014 11:10 AM ------      Message from: Darlin Coco      Created: Wed Apr 18, 2014  8:58 PM       Please report.  The echo was satisfactory.  Vigorous LV systolic function.  CSD. ------

## 2014-04-24 ENCOUNTER — Other Ambulatory Visit: Payer: Self-pay | Admitting: *Deleted

## 2014-04-25 ENCOUNTER — Ambulatory Visit: Payer: Medicare Other | Admitting: Family Medicine

## 2014-04-25 ENCOUNTER — Telehealth: Payer: Self-pay | Admitting: Family Medicine

## 2014-04-25 MED ORDER — QUETIAPINE FUMARATE 200 MG PO TABS: 200.0000 mg | ORAL_TABLET | Freq: Every day | ORAL | Status: DC

## 2014-04-25 NOTE — Telephone Encounter (Signed)
Note - patient scheduled for f/u OV today 04/25/14. Did not show, called to notify us after missed appointment.  Called patient, spoke with Wendy [name]. She reported multiple symptoms and could not make it to her appointment, but wanted to call to see if she should re-schedule. She reports a constellation of symptoms for about 1 week with dizziness, tremors, and weakness, worse upon standing and getting up out of bed, stated these symptoms caused her to fall. She has had problems with falling in the past, but this was more severe recently. Associated with nausea, no vomiting. Denied any fevers, recent illness, chest pain, SOB. Also admits to having not urinated in 14 hours. Additionally stated she has had fluctuating blood pressure (ranging from 160/80 to 110/70, checked at home). She was concerned that she may be dehydrated.  I advised her to focus on oral rehydration. Told her that she would need to be evaluated in clinic to further evaluate her complaints. Recommended that she call to schedule a follow-up appointment for later today or tomorrow if symptoms continue to worsen. If significant worsening, persistent dizziness / weakness, nausea with vomiting, or new concerns with CP, SOB, abdominal pain, advised patient to go to Emergency Department for further evaluation. Patient understood plan.  [name], [place], PGY-2

## 2014-04-25 NOTE — Telephone Encounter (Signed)
Pt called and would like the doctor to call her. She had an appointment today but didn't show up because she is to sick to come in. [place]

## 2014-04-26 ENCOUNTER — Ambulatory Visit (INDEPENDENT_AMBULATORY_CARE_PROVIDER_SITE_OTHER): Payer: Medicare Other | Admitting: Family Medicine

## 2014-04-26 ENCOUNTER — Ambulatory Visit
Admission: RE | Admit: 2014-04-26 | Discharge: 2014-04-26 | Disposition: A | Discharge status: Home / Self Care | Payer: Medicare Other | Source: Ambulatory Visit | Attending: Family Medicine | Admitting: Family Medicine

## 2014-04-26 ENCOUNTER — Encounter: Payer: Self-pay | Admitting: Family Medicine

## 2014-04-26 VITALS — BP 121/79 | HR 86 | Temp 98.0°F | Wt 182.5 lb

## 2014-04-26 DIAGNOSIS — K5900 Constipation, unspecified: Secondary | ICD-10-CM | POA: Diagnosis not present

## 2014-04-26 DIAGNOSIS — M899 Disorder of bone, unspecified: Secondary | ICD-10-CM | POA: Diagnosis not present

## 2014-04-26 DIAGNOSIS — M8580 Other specified disorders of bone density and structure, unspecified site: Secondary | ICD-10-CM

## 2014-04-26 DIAGNOSIS — M949 Disorder of cartilage, unspecified: Secondary | ICD-10-CM | POA: Diagnosis not present

## 2014-04-26 MED ORDER — BISACODYL 5 MG PO TBEC: 5.0000 mg | DELAYED_RELEASE_TABLET | Freq: Every day | ORAL | Status: DC

## 2014-04-26 MED ORDER — DOCUSATE SODIUM 100 MG PO CAPS: 100.0000 mg | ORAL_CAPSULE | Freq: Two times a day (BID) | ORAL | Status: DC

## 2014-04-26 MED ORDER — POLYETHYLENE GLYCOL 3350 17 G PO PACK
17.0000 g | PACK | Freq: Two times a day (BID) | ORAL | Status: DC
Start: 2014-04-26 — End: 2016-02-06

## 2014-04-26 NOTE — Progress Notes (Signed)
Wendy [name] is a 53 y.o. female who presents today for constipation.  Pt states she has had small hard BM 1-2 x in the past 8 days.  She has been on miralax but otherwise has not had much success with anything.  She denies any fever, chills, nausea, vomiting, diarrhea, abdominal pain, melena, hematochezia.  Past Medical History  Diagnosis Date  . Hepatic cirrhosis due to chronic hepatitis C infection     ETOH causative as well  . Hiatal hernia   . Esophageal stricture     esophageal dysmotility and chronic dysphagia as well  . 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 History of alcohol abuse   . Anxiety and depression   . History of hip fracture   . Allergic rhinitis   . HTN (hypertension)   . Blood transfusion   . Cataract     MILD  . GERD (gastroesophageal reflux disease)   . Osteoporosis   . Ulcer     2007  . Arthritis   . Clotting disorder     prolonged clotting time due to liver disease  . Paraesophageal hernia   . Compression fracture 09/21/2013    History  Smoking status  . Former Smoker -- 0.25 packs/day for 10 years  . Types: Cigarettes  . Quit date: 12/23/2008  Smokeless tobacco  . Never Used    Family History  Problem Relation Age of Onset  . Heart disease Mother   . Colon cancer Neg Hx   . Other Daughter     chromosome abnormalit  . Other Daughter     micropthalmia    Current Outpatient Prescriptions on File Prior to Visit  Medication Sig Dispense Refill  . alendronate (FOSAMAX) 70 MG tablet Take 70 mg by mouth.      Marland Kitchen amoxicillin (AMOXIL) 500 MG capsule TAKE 4 CAPSULES BY MOUTH 30-60 MINUTES PRIOR TO PROCEDURE  4 capsule  0  . calcitonin, salmon, (MIACALCIN/FORTICAL) 200 UNIT/ACT nasal spray Place 1 spray into alternate nostrils daily.      . calcium-vitamin D (OSCAL-500) 500-400 MG-UNIT per tablet Take 2 tablets by mouth 2 (two) times daily.       . carvedilol (COREG) 6.25 MG tablet Take 6.25 mg by mouth every morning.       . clonazePAM (KLONOPIN) 0.5 MG tablet Take 1 tablet (0.5 mg total) by mouth 2 (two) times daily as needed for anxiety.  60 tablet  2  . cyclobenzaprine (FLEXERIL) 10 MG tablet Take 1 tablet (10 mg total) by mouth 3 (three) times daily as needed for muscle spasms.  30 tablet  1  . cycloSPORINE (RESTASIS) 0.05 % ophthalmic emulsion Place 1 drop into both eyes 2 (two) times daily as needed (For dry eyes.).      Marland Kitchen furosemide (LASIX) 40 MG tablet Take 20 mg by mouth every morning.      Marland Kitchen glycerin adult (GLYCERIN ADULT) 2 G SUPP Place 1 suppository rectally daily as needed for moderate constipation.  10 each  0  . hydrOXYzine (ATARAX/VISTARIL) 25 MG tablet Take 25 mg by mouth 4 (four) times daily as needed for itching.      . lactulose (CHRONULAC) 10 GM/15ML solution Take 45 mLs (30 g total) by mouth 4 (four) times daily as needed (for constipation).  960 mL  6  . loratadine (CLARITIN) 10 MG tablet Take 10 mg by mouth every morning.      . Olopatadine HCl 0.2 % SOLN Place  1 drop into both eyes every morning.       Marland Kitchen omeprazole (PRILOSEC) 40 MG capsule Take 40 mg by mouth every morning.      . ondansetron (ZOFRAN ODT) 4 MG disintegrating tablet Take 1 tablet (4 mg total) by mouth every 8 (eight) hours as needed for nausea or vomiting.  50 tablet  0  . Prenatal 28-0.8 MG TABS Take 1 tablet by mouth daily.      . QUEtiapine (SEROQUEL) 200 MG tablet Take 1 tablet (200 mg total) by mouth at bedtime.  30 tablet  3  . rifaximin (XIFAXAN) 550 MG TABS tablet Take 550 mg by mouth 2 (two) times daily.      Marland Kitchen spironolactone (ALDACTONE) 100 MG tablet Take 100 mg by mouth every morning.      . traMADol (ULTRAM) 50 MG tablet Take 50 mg by mouth every 12 (twelve) hours as needed (For pain.).       No current facility-administered medications on file prior to visit.    ROS: Per HPI.  All other systems reviewed and are negative.   Physical Exam Filed Vitals:   04/26/14 1012  BP: 121/79  Pulse: 86  Temp: 98  F (36.7 C)    Physical Examination: General appearance - alert, well appearing, and in no distress Chest - clear to auscultation, no wheezes, rales or rhonchi, symmetric air entry Heart - normal rate and regular rhythm Abdomen - Soft/NT/ND/NABS, no HSM, no suprapubic pain    Chemistry      Component Value Date/Time   NA 135 03/28/2014 1136   K 4.0 03/28/2014 1136   CL 103 03/28/2014 1136   CO2 26 03/28/2014 1136   BUN 13 03/28/2014 1136   CREATININE 0.9 03/28/2014 1136   CREATININE 2.25* 02/01/2014 1610      Component Value Date/Time   CALCIUM 8.8 03/28/2014 1136   ALKPHOS 139* 02/01/2014 1610   AST 43* 02/01/2014 1610   ALT 35 02/01/2014 1610   BILITOT 0.6 02/01/2014 1610

## 2014-04-26 NOTE — Assessment & Plan Note (Signed)
Pt states she has had small hard BM 1-2 x in the past 8 days.  She has been on miralax but otherwise has not had much success with anything.  She denies any fever, chills, nausea, vomiting, diarrhea, abdominal pain, melena, hematochezia.  No S/Sx of SBO at this time and will continue with Miralax BID, increase fiber in diet, Colace BID and Dulcolax.  F/U PRN and can consider glycerin suppositories vs dulcolax enemas.

## 2014-04-26 NOTE — Patient Instructions (Signed)
Please take the Colace two times per day. Please take the Miralax two times per day Please take the Dulcolax once per day. If you do not have a bowel movement within 2 days, please call and let us know.   Thanks, Dr. Awanda Mink

## 2014-05-02 ENCOUNTER — Other Ambulatory Visit: Payer: Self-pay | Admitting: *Deleted

## 2014-05-02 MED ORDER — HYDROXYZINE HCL 25 MG PO TABS: 25.0000 mg | ORAL_TABLET | Freq: Four times a day (QID) | ORAL | Status: DC | PRN

## 2014-05-03 ENCOUNTER — Other Ambulatory Visit: Payer: Self-pay | Admitting: *Deleted

## 2014-05-03 MED ORDER — HYDROXYZINE HCL 25 MG PO TABS: 25.0000 mg | ORAL_TABLET | Freq: Four times a day (QID) | ORAL | Status: DC | PRN

## 2014-05-04 ENCOUNTER — Encounter: Payer: Self-pay | Admitting: Internal Medicine

## 2014-05-04 ENCOUNTER — Telehealth: Payer: Self-pay | Admitting: Family Medicine

## 2014-05-04 NOTE — Telephone Encounter (Signed)
Agree. Patient should call Cardiologist office [facility]) first for ECHO results. Otherwise, we can call her next week when I am back in the office.  [name], [place], PGY-2

## 2014-05-04 NOTE — Telephone Encounter (Signed)
PT requesting results from Echo done on 04/18/14. Advised pt that Dr. Parks Ranger did not order Echo and to call [facility] for results but pt claims that she does not know who Dr. Mare Ferrari is. Pls advise.

## 2014-05-07 NOTE — Telephone Encounter (Signed)
Tried calling, no answer, no voicemail. If patient returns call please relay message from PCP.

## 2014-05-11 ENCOUNTER — Other Ambulatory Visit: Payer: Self-pay | Admitting: *Deleted

## 2014-05-14 MED ORDER — FUROSEMIDE 40 MG PO TABS: 20.0000 mg | ORAL_TABLET | Freq: Every morning | ORAL | Status: DC

## 2014-05-16 ENCOUNTER — Telehealth: Payer: Self-pay | Admitting: Family Medicine

## 2014-05-16 ENCOUNTER — Ambulatory Visit (INDEPENDENT_AMBULATORY_CARE_PROVIDER_SITE_OTHER): Payer: Medicare Other | Admitting: Family Medicine

## 2014-05-16 ENCOUNTER — Encounter: Payer: Self-pay | Admitting: Family Medicine

## 2014-05-16 ENCOUNTER — Other Ambulatory Visit: Payer: Self-pay | Admitting: Family Medicine

## 2014-05-16 VITALS — BP 134/78 | HR 82 | Temp 98.2°F | Ht 62.0 in | Wt 182.5 lb

## 2014-05-16 DIAGNOSIS — M549 Dorsalgia, unspecified: Secondary | ICD-10-CM

## 2014-05-16 DIAGNOSIS — F411 Generalized anxiety disorder: Secondary | ICD-10-CM | POA: Diagnosis not present

## 2014-05-16 DIAGNOSIS — R55 Syncope and collapse: Secondary | ICD-10-CM | POA: Diagnosis not present

## 2014-05-16 DIAGNOSIS — K219 Gastro-esophageal reflux disease without esophagitis: Secondary | ICD-10-CM

## 2014-05-16 DIAGNOSIS — F4001 Agoraphobia with panic disorder: Secondary | ICD-10-CM

## 2014-05-16 DIAGNOSIS — G8929 Other chronic pain: Secondary | ICD-10-CM | POA: Diagnosis not present

## 2014-05-16 MED ORDER — CLONAZEPAM 1 MG PO TABS: 1.0000 mg | ORAL_TABLET | Freq: Two times a day (BID) | ORAL | Status: DC | PRN

## 2014-05-16 MED ORDER — TRAMADOL HCL 50 MG PO TABS: 50.0000 mg | ORAL_TABLET | Freq: Three times a day (TID) | ORAL | Status: DC | PRN

## 2014-05-16 NOTE — Patient Instructions (Signed)
Dear Wendy [name], Thank you for coming in to clinic today. It was good to see you!  Today we discussed your Low Back Pain, Anxiety, and Reflux with Hernia. 1. I recommend following up with Dr. Monica Martinez on Monday to discuss your potential Hiatal Hernia surgery. Follow-up with Dr. Carlean Purl for further surgical plans. 2. For your Low Back Pain, I refilled your Tramadol - take as needed. Continue using Heating Pad for low back. 3. For Anxiety, I have printed new rx for Klonopin 1mg  take twice daily. Start with 1mg  in morning and 0.5mg  in evening for 1 week, then work your way up to 1mg  twice daily. 4. Make sure you eat regular meals, and avoid skipping meals to avoid having low blood sugar. Be cautious when standing up after exertion, and make sure you have support before walking. If you continue to have worsening symptoms with dizziness episodes, make sure you schedule a future follow-up. Otherwise if you have a major fall or episode, you should be evaluated in the Emergency Department.  Some important numbers from today's visit: BP - 134/78  Please schedule a follow-up appointment with me (Dr. Parks Ranger) in 3 to 6 months for refills and follow-up.  If you have any other questions or concerns, please feel free to call the clinic to contact me. You may also schedule an earlier appointment if necessary.  However, if your symptoms get significantly worse, please go to the Emergency Department to seek immediate medical attention.  [name], [place]

## 2014-05-16 NOTE — Assessment & Plan Note (Signed)
Some increased Anxiety while on Klonopin, previously on higher dose  Plan: 1. Increase Klonopin from 0.5mg  BID to 1mg  BID (advised titrate up by 1 week), given 3 mo rx printed 2. RTC for re-evaluation

## 2014-05-16 NOTE — Telephone Encounter (Signed)
Called [name], discussed her concerns. She reports that she has been persistently fatigued and tired with decreased energy, states symptoms worse at end of day. Also admits to poor sleep. States she forgot to mention these complaints during appointment earlier today.  Reviewed recent labs, electrolytes stable, Vit D and Vit B12 stable, CBC with stable Hgb 11.8, last TSH normal 2013.  Advised her to follow previous instructions with focusing on improving diet / nutrition with regular meals + snacks, with protein supplement, anticipate improved energy with better nutrition. Also, advised to continue taking MVI (Prenatal + Iron). Recommended may check TSH in future. Otherwise, may repeat labs in several months if needed. Additionally discussed improving sleep hygiene to improve sleeping habits.  Recommend to discuss concerns in more detail at next follow-up appointment if persistent. Patient agreed and understood.  [name], [place], PGY-2

## 2014-05-16 NOTE — Progress Notes (Signed)
   Subjective:    Patient ID: Wendy [name], female    DOB: 05-10-1961, 53 y.o.   MRN: [id number]  HPI  GI / Ventral Hernia: - Followed by Dr. Carlean Purl, anticipated hiatal hernia repair, scheduled to see Dr. Monica Martinez [facility]) well-established, previously advised to be in a "grey area" and concern due to chronic hepatitis liver disease, increased risk for complications. - Complains of worsening acid reflux and epigastric abdominal pain, worse with certain foods and positional changes - Denies fevers, nausea / vomiting, abdominal pain currently  DIZZINESS: - States that about 2-3 weeks ago, got dizzy after standing up from toilet, fell back, admits to hitting head, symptoms with lightheadedness and dizziness - Admits to near-syncope, but denies LOC. States she can tell before it is about to happen - Denies syncope/LOC episode, vertigo, nausea  Anxiety: - Currently taking Klonopin 0.5mg  BID, previously on 1mg  BID would like to go back up - Admits to some increased anxiety. Denies significant depression  CHRONIC PAIN / LOW BACK PAIN: - Currently taking Tramadol 50mg  2x daily - Chronic LBP, without radiating, lower and left lower, improves with rest - Right lower ext recurrent edema without erythema. - Denies fevers/chills, saddle anesthesia, urinary retention or incontinence, no lower ext edema, redness, or pain.  I have reviewed and updated the following as appropriate: allergies and current medications  Social Hx: - Former smoker  Review of Systems  See above HPI    Objective:   Physical Exam  BP 134/78  Pulse 82  Temp(Src) 98.2 F (36.8 C) (Oral)  Ht 5\' 2"  (1.575 m)  Wt 182 lb 8 oz (82.781 kg)  BMI 33.37 kg/m2  SpO2 96%  Gen - chronically ill-appearing, obese, sitting on wheelchair, NAD  Heart - RRR, no murmurs heard  Lungs - CTAB, no wheezing, crackles, or rhonchi. Normal work of breathing.  Abd - obese, minimal abdominal tenderness, improved abdominal  distention, +active BS  MSK - non-tender over Lumbar spinous processes, palpable mild tenderness lower left paraspinal muscles without hypertonicity  Ext - no bilateral edema, no erythema or asymmetry, peripheral pulses intact +2 b/l  Skin - warm, dry, no rashes  Neuro - awake, alert, oriented, grossly non-focal      Assessment & Plan:   See specific A&P problem list for details.

## 2014-05-16 NOTE — Assessment & Plan Note (Signed)
Continues on Klonopin - See documentation under Anxiety  Plan: 1. Increased Klonopin to 1mg  BID

## 2014-05-16 NOTE — Assessment & Plan Note (Signed)
Persistent, without improvement - Reviewed recent EGD  Plan: 1. Follow-up with Dr. Carlean Purl and Dr. Monica Martinez (Hepatology) for evaluation if will tolerate hiatal hernia repair

## 2014-05-16 NOTE — Assessment & Plan Note (Signed)
Stable chronic LBP, suspected due to chronic compression fractures and lumbar DJD - Controlled on Tramadol - Reviewed DEXA (noted osteopenia, slightly worsened from previous DEXA)  Plan: 1. Refilled Tramadol 50mg  BID, printed 3 month rx 2. Continue Tylenol safe dose as advised previously up to 2000mg  daily 3. Continue Alendronate (bisphosphonate), Calcitonin, Vitamin D for hx compression fractures

## 2014-05-16 NOTE — Telephone Encounter (Signed)
Pt says she is very tired all the time. She would like drs suggestion about what to do

## 2014-05-16 NOTE — Assessment & Plan Note (Signed)
Currently with occasional episodes of dizziness and near syncope, without LOC, persistent symptoms, CP or SOB - Seems related to standing / position change, may be related to orthostatics, irregular PO intake, possible dehydration - Reviewed results recent ECHO normal EF 62-70%, grade 1 diastolic   Plan: 1. Advised improved regular diet with 3x meals + snacks, regular hydration, may try protein nutritional supplement daily if needed 2. Reviewed recent labs without significant abnormality, ECHO 3. Continue to monitor 4. Advised close follow-up, or go to ED if significant symptoms with syncope or persistent symptoms

## 2014-05-21 DIAGNOSIS — K7460 Unspecified cirrhosis of liver: Secondary | ICD-10-CM | POA: Diagnosis not present

## 2014-05-21 DIAGNOSIS — B182 Chronic viral hepatitis C: Secondary | ICD-10-CM | POA: Diagnosis not present

## 2014-05-28 ENCOUNTER — Other Ambulatory Visit: Payer: Self-pay | Admitting: Family Medicine

## 2014-06-11 ENCOUNTER — Ambulatory Visit: Payer: Medicare Other | Admitting: Family Medicine

## 2014-06-12 ENCOUNTER — Telehealth: Payer: Self-pay | Admitting: Family Medicine

## 2014-06-12 NOTE — Telephone Encounter (Signed)
Pt called and needs enough medication to get her until her appointment on 11/6. She needs Klonopin. jw

## 2014-06-12 NOTE — Telephone Encounter (Signed)
Attempted to call patient, told by husband that she would be able to call back later today.  Please ask her how much Klonopin she has left on current rx, how many refills, and how many times she is taking daily.  I looked at last rx from 05/16/14 (#60 tablets, 2 refills) she should have 3 month supply, and plenty to get her actually into December.  If she has enough Klonopin with refills, then we can discuss this at her appointment on 11/6, and do not need to send in new rx.  [name], [place], PGY-2

## 2014-06-15 ENCOUNTER — Telehealth: Payer: Self-pay | Admitting: Family Medicine

## 2014-06-15 MED ORDER — CARVEDILOL 6.25 MG PO TABS: ORAL_TABLET | ORAL | Status: DC

## 2014-06-15 NOTE — Telephone Encounter (Signed)
Pt called because her Coreg is 1 tablet twice a day. But she said the doctor changed this to 2 tablets twice a day and the pharmacy will not fill the prescription the new way since it is not written this way. jw

## 2014-06-15 NOTE — Telephone Encounter (Signed)
Reviewed chart. Unclear on previous documentation for change of Coreg from 1 tablet BID to 2 tablets BID. Suspect this may have been changed by Cardiology. Re-ordered Coreg for 2 tablets (12.5mg ) BID, sent to pharmacy.  [name], [place], PGY-2

## 2014-06-17 ENCOUNTER — Other Ambulatory Visit: Payer: Self-pay | Admitting: Family Medicine

## 2014-06-25 ENCOUNTER — Telehealth: Payer: Self-pay

## 2014-06-25 NOTE — Telephone Encounter (Signed)
Sent information to [employer] to get prior authorization done for xifaxan.  She takes this for hepatic encephalopathy.  Patient aware we are working on this. Confirmed with husband that her insurance is Medicare, Medicaid and she has Medicare Part D as well for drug coverage.

## 2014-06-28 NOTE — Telephone Encounter (Signed)
Approved and [employer] to contact patient about delivery of xifaxan.

## 2014-06-29 ENCOUNTER — Ambulatory Visit (INDEPENDENT_AMBULATORY_CARE_PROVIDER_SITE_OTHER): Payer: Medicare Other | Admitting: Family Medicine

## 2014-06-29 ENCOUNTER — Encounter: Payer: Self-pay | Admitting: Family Medicine

## 2014-06-29 ENCOUNTER — Ambulatory Visit (INDEPENDENT_AMBULATORY_CARE_PROVIDER_SITE_OTHER): Payer: Medicare Other

## 2014-06-29 VITALS — BP 127/84 | HR 82 | Temp 97.5°F | Wt 174.7 lb

## 2014-06-29 DIAGNOSIS — B182 Chronic viral hepatitis C: Secondary | ICD-10-CM

## 2014-06-29 DIAGNOSIS — R55 Syncope and collapse: Secondary | ICD-10-CM

## 2014-06-29 DIAGNOSIS — Z23 Encounter for immunization: Secondary | ICD-10-CM

## 2014-06-29 DIAGNOSIS — R296 Repeated falls: Secondary | ICD-10-CM | POA: Diagnosis not present

## 2014-06-29 MED ORDER — CARVEDILOL 6.25 MG PO TABS: 6.2500 mg | ORAL_TABLET | Freq: Two times a day (BID) | ORAL | Status: DC

## 2014-06-29 NOTE — Patient Instructions (Signed)
Dear [name], Thank you for coming in to clinic today.  1. For your frequent Falls - here are the following medication changes: - Claritin - STOP taking - Lasix - take every OTHER day (half tablet, 20mg ), if fluid builds up may take every day - Flexeril - take half tablet (5mg ) only as needed, not every day - Coreg - take 1 tablet twice daily with meals (NOT 2 tablets twice daily) 2. The falls may be due to medication side effects, or prior brain injury and related to liver disease 3. Ordered Physical Therapy - you will be notified with this appointment to work on strength and balance training. 4. Continue to eat and drink well, stay well hydrated, use walker / cane, have assistance nearby, sit if feeling like going to fall  Please discuss lab work and treatment with [place] with your Liver Doctors.  Please schedule a follow-up appointment with me (Dr. Parks Ranger) in 1 to 3 months for follow-up.  If you have any other questions or concerns, please feel free to call the clinic to contact me. You may also schedule an earlier appointment if necessary.  However, if your symptoms get significantly worse, please go to the Emergency Department to seek immediate medical attention.  [name], [place]

## 2014-06-29 NOTE — Assessment & Plan Note (Signed)
Persistent pre-syncope-like episodes with regular falls, seems related to dizziness, suspect component of orthostatic hypotension. Also consider polypharmacy and medication side-effect. Does not seem to be cardiac in nature given recent cardiac work-up (ECHO 03/2014 with intact LV EF, Myoview 05/2013 without ischemia, and EF 79%).  Plan: 1. Reduce polypharmacy, medication changes: - discontinue Claritin, reduce Hydroxyzine (may need to discontinue in future) - decrease Coreg from 12.5mg  BID to 6.25mg  BID (now 1 tab BID, instead of 2) - Reduce Lasix to 20mg  every other day (taking half tab, 40mg ), if edema / fluid worsens may take daily - Reduce Flexeril to half tab 5mg  PRN (not daily), goal to discontinue 2. Continue regular diet / hydration 3. Regular use with walker / cane, support with ambulation 4. Referral to outpatient PT for gait stability and postural strengthening 5. 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

## 2014-06-29 NOTE — Assessment & Plan Note (Signed)
Chronic Hep C with complication of hepatic encephalopathy  Followed by Dr. Monica Martinez, Dr. Carlean Purl - Prescribed Harvoni - F/u as needed

## 2014-06-29 NOTE — Progress Notes (Signed)
Patient ID: Wendy [name], female   DOB: November 01, 1960, 52 y.o.   MRN: [id number]   Subjective:   HPI  PRE-SYNCOPE / RECURRENT FALLS / DIZZINESS: - History with falls over the past 4-5 months, about 1 fall weekly, even if using walker has experienced some falls. - Reports that she is still experiencing falls, describes acute episodes of dizziness that seems to trigger falls, limiting function within house and leaving. Admits to occasionally losing consciousness, also seems associated with some difficulty breathing and palpitations. Worse when stands up from sitting position. - Admits to previously hitting head, also multiple bruises from falls - Denies vertigo, nausea, loss of vision, HA, active CP or SOB, palpitations  Hepatitis C: - Followed by [facility], Dr. Monica Martinez, last seen 3 weeks ago - Reported that discussed starting treatment with Harvoni for curing Hep C, however awaiting blood work prior to starting this medication, and waiting on receiving medication. Also discussed future treatment with discussed liver transplant - Compliant with Rifaximin and Ribavirin - Admits to some confusion - Admits to regular bowel movements, taking lactulose, colace, miralax, and glycerin PRN - Denies fevers, nausea / vomiting, abdominal pain currently  Anxiety: - Currently taking Klonopin 1mg  BID, tolerating well, denies any significant sedation or side-effects  I have reviewed and updated the following as appropriate: allergies and current medications  Social Hx: - Former smoker  Review of Systems  See above HPI    Objective:   Physical Exam  BP 127/84 mmHg  Pulse 82  Temp(Src) 97.5 F (36.4 C) (Oral)  Wt 174 lb 11.2 oz (79.243 kg)  Gen - chronically ill-appearing, obese, NAD HEENT - NCAT, PERRL, EOMI, MMM Heart - RRR, no murmurs heard  Lungs - CTAB, no wheezing, crackles, or rhonchi. Normal work of breathing.  Abd - soft, NTND, +active BS Ext - no bilateral edema, no  erythema or asymmetry, peripheral pulses intact +2 b/l  Skin - warm, dry, no rashes  Neuro - awake, alert, oriented, grossly non-focal, CN-II-XII intact, distal muscle strength 5/5 in ext grip and ankles, mild dizziness on standing, gait cautious, cerebellar function intact     Assessment & Plan:   See specific A&P problem list for details.

## 2014-07-03 ENCOUNTER — Telehealth: Payer: Self-pay | Admitting: Family Medicine

## 2014-07-03 DIAGNOSIS — Z79899 Other long term (current) drug therapy: Secondary | ICD-10-CM | POA: Diagnosis not present

## 2014-07-03 DIAGNOSIS — B182 Chronic viral hepatitis C: Secondary | ICD-10-CM | POA: Diagnosis not present

## 2014-07-03 NOTE — Telephone Encounter (Signed)
Has dentist appt on thurs. Needs her amoxicillian refilled

## 2014-07-04 ENCOUNTER — Telehealth: Payer: Self-pay | Admitting: *Deleted

## 2014-07-04 MED ORDER — AMOXICILLIN 500 MG PO CAPS: ORAL_CAPSULE | ORAL | Status: DC

## 2014-07-04 NOTE — Telephone Encounter (Signed)
Pt calls and states that she has a couple questions for MD, that she forgot to ask yesterday.  She would not go into detail with me.  Advised I would send message back to his nurses. [place], [place]

## 2014-07-04 NOTE — Telephone Encounter (Signed)
Refilled Amoxicillin for pre-procedure prophylaxis.  Wendy [name], [place], PGY-2

## 2014-07-04 NOTE — Telephone Encounter (Signed)
Left message for patient to call back with more details.

## 2014-07-05 ENCOUNTER — Telehealth: Payer: Self-pay | Admitting: *Deleted

## 2014-07-05 DIAGNOSIS — B182 Chronic viral hepatitis C: Secondary | ICD-10-CM | POA: Diagnosis not present

## 2014-07-05 DIAGNOSIS — Z79899 Other long term (current) drug therapy: Secondary | ICD-10-CM | POA: Diagnosis not present

## 2014-07-05 NOTE — Telephone Encounter (Signed)
Attempted to call patient back. LVM to call back if further concerns. If she calls back, please try to clarify what procedures she is asking about. After review of chart, there is no specific recent procedure that should have pending results. Make sure that she calls her other specialist doctor's offices to ask if it is a procedure that they have performed.  Will be happy to discuss and answer questions at next visit in future.  [name], [place], PGY-2

## 2014-07-05 NOTE — Telephone Encounter (Signed)
Pt has concerns about past procedures that she had done and havent heard anything back like she was told she was. Ashtynn Berke CMA

## 2014-07-05 NOTE — Telephone Encounter (Signed)
Left another message for patient to call back 

## 2014-07-08 ENCOUNTER — Other Ambulatory Visit: Payer: Self-pay | Admitting: Family Medicine

## 2014-07-09 ENCOUNTER — Telehealth: Payer: Self-pay | Admitting: Family Medicine

## 2014-07-09 DIAGNOSIS — Z87820 Personal history of traumatic brain injury: Secondary | ICD-10-CM

## 2014-07-09 NOTE — Telephone Encounter (Signed)
Pt is returning Dr. Parks Ranger phone call jw

## 2014-07-09 NOTE — Telephone Encounter (Signed)
Pt called and wanted to go over some records that were sent from another doctor concerning her head injuries and if this might be the reason for some of her issues. jw

## 2014-07-09 NOTE — Telephone Encounter (Signed)
Called patient. Spoke with family member. Patient was currently unavailable. Advised that I would try to call back later. If she had any other specific questions or more information to share, she may call back if needed.  [name], [place], PGY-2

## 2014-07-10 ENCOUNTER — Telehealth: Payer: Self-pay | Admitting: Family Medicine

## 2014-07-10 DIAGNOSIS — Z87820 Personal history of traumatic brain injury: Secondary | ICD-10-CM | POA: Insufficient documentation

## 2014-07-10 NOTE — Telephone Encounter (Signed)
Pt stated she had been contacted by [facility] and the treatment is working

## 2014-07-10 NOTE — Telephone Encounter (Signed)
Called patient on 07/09/14 to discuss concerns. 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 As discussed with patient at last OV this prior brain injury is most likely contributing to her current symptoms, at that time decision made to reduce number of medications due to concern for over sedation with polypharmacy, focus on prevention with PT referral.  After discussion with patient, plan to pursue further evaluation with referral to Neurology and will defer future imaging, such as repeat Brain MRI to Neurology. As discussed, there may be limited treatment, but she would like to know more information about what is causing her symptoms, especially given chronic gradual worsening.  Placed referral to Neurology. Patient should be notified with appointment when available. She is scheduled for PT Rehab Falls Evaluation on 07/30/14 at 2:45pm.  [name], [place], PGY-2

## 2014-07-16 DIAGNOSIS — B182 Chronic viral hepatitis C: Secondary | ICD-10-CM | POA: Diagnosis not present

## 2014-07-17 ENCOUNTER — Ambulatory Visit (INDEPENDENT_AMBULATORY_CARE_PROVIDER_SITE_OTHER): Payer: Medicare Other | Admitting: Neurology

## 2014-07-17 ENCOUNTER — Telehealth: Payer: Self-pay | Admitting: Family Medicine

## 2014-07-17 ENCOUNTER — Encounter: Payer: Self-pay | Admitting: Neurology

## 2014-07-17 ENCOUNTER — Ambulatory Visit: Payer: Medicare Other | Admitting: Neurology

## 2014-07-17 VITALS — BP 173/103 | HR 86 | Ht 62.0 in | Wt 179.0 lb

## 2014-07-17 DIAGNOSIS — F919 Conduct disorder, unspecified: Secondary | ICD-10-CM | POA: Diagnosis not present

## 2014-07-17 DIAGNOSIS — R4020 Unspecified coma: Secondary | ICD-10-CM

## 2014-07-17 DIAGNOSIS — R5382 Chronic fatigue, unspecified: Secondary | ICD-10-CM

## 2014-07-17 DIAGNOSIS — R404 Transient alteration of awareness: Secondary | ICD-10-CM

## 2014-07-17 DIAGNOSIS — H539 Unspecified visual disturbance: Secondary | ICD-10-CM

## 2014-07-17 DIAGNOSIS — R4689 Other symptoms and signs involving appearance and behavior: Principal | ICD-10-CM

## 2014-07-17 DIAGNOSIS — R4189 Other symptoms and signs involving cognitive functions and awareness: Secondary | ICD-10-CM

## 2014-07-17 DIAGNOSIS — R410 Disorientation, unspecified: Secondary | ICD-10-CM | POA: Diagnosis not present

## 2014-07-17 DIAGNOSIS — K7460 Unspecified cirrhosis of liver: Secondary | ICD-10-CM | POA: Diagnosis not present

## 2014-07-17 DIAGNOSIS — B182 Chronic viral hepatitis C: Secondary | ICD-10-CM | POA: Diagnosis not present

## 2014-07-17 MED ORDER — CARVEDILOL 12.5 MG PO TABS: 12.5000 mg | ORAL_TABLET | Freq: Two times a day (BID) | ORAL | Status: DC

## 2014-07-17 NOTE — Patient Instructions (Addendum)
Remember to drink plenty of fluid, eat healthy meals and do not skip any meals. Try to eat protein with a every meal and eat a healthy snack such as fruit or nuts in between meals. Try to keep a regular sleep-wake schedule and try to exercise daily, particularly in the form of walking, 20-30 minutes a day, if you can.   As far as diagnostic testing: EEG(please schedule at checkout), Carotid doppler and MRI of the brain (we will call you to schedule), Labs   I would like to see you back in 3 months, sooner if we need to. Please call us with any interim questions, concerns, problems, updates or refill requests.   My clinical assistant and will answer any of your questions and relay your messages to me and also relay most of my messages to you.   Our phone number is [phone]. We also have an after hours call service for urgent matters and there is a physician on-call for urgent questions. For any emergencies you know to call 911 or go to the nearest emergency room

## 2014-07-17 NOTE — Telephone Encounter (Signed)
Pt called because her liver doctor wants to change her dosage on her Coreg. He would like her to take 2 in the AM and 2 in the PM because her BP was 165/112 yesterday and pulse was 88. jw
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
## 2014-07-17 NOTE — Telephone Encounter (Signed)
Sounds reasonable. Rx for 12.5 BID coreg

## 2014-07-23 ENCOUNTER — Other Ambulatory Visit (INDEPENDENT_AMBULATORY_CARE_PROVIDER_SITE_OTHER): Payer: Self-pay

## 2014-07-23 ENCOUNTER — Telehealth: Payer: Self-pay | Admitting: *Deleted

## 2014-07-23 DIAGNOSIS — R5383 Other fatigue: Secondary | ICD-10-CM | POA: Diagnosis not present

## 2014-07-23 DIAGNOSIS — R4189 Other symptoms and signs involving cognitive functions and awareness: Secondary | ICD-10-CM | POA: Diagnosis not present

## 2014-07-23 DIAGNOSIS — Z0289 Encounter for other administrative examinations: Secondary | ICD-10-CM

## 2014-07-23 DIAGNOSIS — R410 Disorientation, unspecified: Secondary | ICD-10-CM | POA: Diagnosis not present

## 2014-07-23 DIAGNOSIS — F29 Unspecified psychosis not due to a substance or known physiological condition: Secondary | ICD-10-CM | POA: Diagnosis not present

## 2014-07-23 DIAGNOSIS — R5382 Chronic fatigue, unspecified: Secondary | ICD-10-CM | POA: Diagnosis not present

## 2014-07-23 DIAGNOSIS — F919 Conduct disorder, unspecified: Secondary | ICD-10-CM | POA: Diagnosis not present

## 2014-07-23 NOTE — Telephone Encounter (Signed)
Probably reasonable to hold given very sml risk of osteonecrosis of the jaw during invasive dental procedures. However also reasonable for her to call [facility] who I believe is prescribing this medication for her. They may be more knowledgable about when to stop/restart if required [facility], MD

## 2014-07-23 NOTE — Telephone Encounter (Signed)
Pt wants to know if MD needs to change her BP medication.  Her BP was 159/100 at her oral surgeon this am.   Also wants MD to know that her liver MD said that she was "cured". Cierah Crader, Salome Spotted

## 2014-07-23 NOTE — Telephone Encounter (Signed)
This is great news!  BP may have been up 2/2 pain or anxiety? Would advise to come in for nursing BP check or monitor serially at home and call with numbers? Can advised based on that [facility], MD

## 2014-07-23 NOTE — Telephone Encounter (Signed)
Pt calls again.  Wants MD to know that she is having oral surgey and the surgeon wanted her to check with her MD on wether or not she should stop her Fosamax. Wendy [name], Salome Spotted

## 2014-07-24 NOTE — Telephone Encounter (Signed)
Pt informed, she will call them and ask. Danikah Budzik CMA

## 2014-07-25 LAB — BASIC METABOLIC PANEL
BUN/Creatinine Ratio: 13 (ref 9–23)
BUN: 8 mg/dL (ref 6–24)
CALCIUM: 8.6 mg/dL — AB (ref 8.7–10.2)
CHLORIDE: 106 mmol/L (ref 97–108)
CO2: 23 mmol/L (ref 18–29)
CREATININE: 0.64 mg/dL (ref 0.57–1.00)
GFR calc Af Amer: 117 mL/min/{1.73_m2} (ref >59)
GFR calc non Af Amer: 101 mL/min/{1.73_m2} (ref >59)
Glucose: 87 mg/dL (ref 65–99)
Potassium: 4.3 mmol/L (ref 3.5–5.2)
Sodium: 140 mmol/L (ref 134–144)

## 2014-07-25 LAB — VITAMIN B1, WHOLE BLOOD: Thiamine: 200 nmol/L (ref 66.5–200.0)

## 2014-07-25 LAB — TSH: TSH: 5.93 u[IU]/mL — ABNORMAL HIGH (ref 0.450–4.500)

## 2014-07-26 ENCOUNTER — Other Ambulatory Visit: Payer: Self-pay | Admitting: Family Medicine

## 2014-07-27 ENCOUNTER — Other Ambulatory Visit: Payer: Self-pay | Admitting: Family Medicine

## 2014-07-30 ENCOUNTER — Telehealth: Payer: Self-pay | Admitting: Neurology

## 2014-07-30 ENCOUNTER — Ambulatory Visit: Payer: Medicare Other

## 2014-07-30 NOTE — Telephone Encounter (Signed)
Spoke with Patient regarding her elevated TSH lab. Lab was drawn due to patient's complaints of memory problems, confusion, fatigue.  Suggested following up with her primary care Dr. Parks Ranger for evaluation of Hypothyroidism.

## 2014-07-31 ENCOUNTER — Telehealth: Payer: Self-pay | Admitting: Family Medicine

## 2014-07-31 NOTE — Telephone Encounter (Signed)
Pt called because she was referred to another doctor and that doctor ran test and said that her thyroid is not working and wanted the PCP to look at her results. jw

## 2014-08-01 NOTE — Telephone Encounter (Signed)
Reviewed chart. Last seen by Neurology 07/17/14, at that time labs checked including TSH, found to be elevated at 5.930, which may be suggest hypothyroidism. I reviewed her previous lab history, which showed similar elevated TSH in 2010, but since normal values of TSH.  I recommend the following plan: re-check labwork (TSH, and additional Free T4) at next follow-up within 2-3 months. After review of these labs, we could discuss if her hormone levels have improved, remained stable, or if need to be replaced.  Please call patient to relay this message, regarding the above plan.  Thank you, [name], [place], PGY-2

## 2014-08-02 ENCOUNTER — Other Ambulatory Visit: Payer: Medicare Other

## 2014-08-03 NOTE — Telephone Encounter (Signed)
Pt informed stated she was in formed by someone yesterday. Wendy [name] Kennon Holter, CMA

## 2014-08-06 ENCOUNTER — Other Ambulatory Visit: Payer: Self-pay | Admitting: Family Medicine

## 2014-08-06 ENCOUNTER — Ambulatory Visit (INDEPENDENT_AMBULATORY_CARE_PROVIDER_SITE_OTHER): Payer: Medicare Other | Admitting: Neurology

## 2014-08-06 DIAGNOSIS — R4189 Other symptoms and signs involving cognitive functions and awareness: Secondary | ICD-10-CM | POA: Diagnosis not present

## 2014-08-06 DIAGNOSIS — R410 Disorientation, unspecified: Secondary | ICD-10-CM

## 2014-08-06 DIAGNOSIS — R404 Transient alteration of awareness: Secondary | ICD-10-CM

## 2014-08-06 DIAGNOSIS — R4689 Other symptoms and signs involving appearance and behavior: Principal | ICD-10-CM
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
## 2014-08-08 ENCOUNTER — Ambulatory Visit: Payer: Medicare Other

## 2014-08-10 ENCOUNTER — Ambulatory Visit: Payer: Medicare Other | Attending: Family Medicine

## 2014-08-10 DIAGNOSIS — R279 Unspecified lack of coordination: Secondary | ICD-10-CM | POA: Insufficient documentation

## 2014-08-10 DIAGNOSIS — R262 Difficulty in walking, not elsewhere classified: Secondary | ICD-10-CM | POA: Diagnosis not present
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
## 2014-08-13 ENCOUNTER — Other Ambulatory Visit: Payer: Self-pay | Admitting: Family Medicine

## 2014-08-13 ENCOUNTER — Telehealth: Payer: Self-pay | Admitting: Family Medicine

## 2014-08-13 ENCOUNTER — Inpatient Hospital Stay: Admission: RE | Admit: 2014-08-13 | Payer: Medicare Other | Source: Ambulatory Visit

## 2014-08-13 NOTE — Telephone Encounter (Signed)
Pt is calling because she is taking Lactulose and it makes her have very bad cramps. She was wanting to know if something can be called in for the cramps. jw

## 2014-08-13 NOTE — Telephone Encounter (Signed)
Called patient, admits to having lower abdominal cramping associated with Lactulose use and frequent BMs. Advised that she may try OTC Pepto-bismol, otherwise some cramping can be difficult to treat, may decrease lactulose until BMs decrease, then resume dose. Patient has follow-up scheduled for 08/27/13.  [name], [place], PGY-2

## 2014-08-16 ENCOUNTER — Ambulatory Visit: Payer: Medicare Other

## 2014-08-20 ENCOUNTER — Other Ambulatory Visit: Payer: Self-pay | Admitting: Family Medicine

## 2014-08-20 DIAGNOSIS — G8929 Other chronic pain: Secondary | ICD-10-CM

## 2014-08-20 DIAGNOSIS — M549 Dorsalgia, unspecified: Principal | ICD-10-CM

## 2014-08-20 NOTE — Telephone Encounter (Signed)
Pt checking status of rx for tramadol, goes to [facility]

## 2014-08-21 ENCOUNTER — Inpatient Hospital Stay: Admission: RE | Admit: 2014-08-21 | Payer: Medicare Other | Source: Ambulatory Visit

## 2014-08-21 MED ORDER — TRAMADOL HCL 50 MG PO TABS: 50.0000 mg | ORAL_TABLET | Freq: Three times a day (TID) | ORAL | Status: DC | PRN

## 2014-08-21 NOTE — Telephone Encounter (Signed)
Phoned in rx Tramadol #[id number] 2 refills to [facility], advised that at this pharmacy since I am a resident the provided [facility] DEA # may not work, and they may return call requesting Attending Physician DEA.  [name], [place], PGY-2

## 2014-08-25 ENCOUNTER — Other Ambulatory Visit: Payer: Self-pay | Admitting: Family Medicine

## 2014-08-25 DIAGNOSIS — F418 Other specified anxiety disorders: Secondary | ICD-10-CM

## 2014-08-27 ENCOUNTER — Ambulatory Visit: Payer: Medicare Other | Admitting: Family Medicine

## 2014-08-28 ENCOUNTER — Ambulatory Visit: Payer: Medicare Other | Attending: Family Medicine | Admitting: Physical Therapy

## 2014-08-28 ENCOUNTER — Encounter: Payer: Self-pay | Admitting: Physical Therapy

## 2014-08-28 DIAGNOSIS — R262 Difficulty in walking, not elsewhere classified: Secondary | ICD-10-CM

## 2014-08-28 DIAGNOSIS — R279 Unspecified lack of coordination: Secondary | ICD-10-CM
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
## 2014-08-28 NOTE — Patient Instructions (Signed)
Benign Positional Vertigo Vertigo means you feel like you or your surroundings are moving when they are not. Benign positional vertigo is the most common form of vertigo. Benign means that the cause of your condition is not serious. Benign positional vertigo is more common in older adults. CAUSES  Benign positional vertigo is the result of an upset in the labyrinth system. This is an area in the middle ear that helps control your balance. This may be caused by a viral infection, head injury, or repetitive motion. However, often no specific cause is found. SYMPTOMS  Symptoms of benign positional vertigo occur when you move your head or eyes in different directions. Some of the symptoms may include:  Loss of balance and falls.  Vomiting.  Blurred vision.  Dizziness.  Nausea.  Involuntary eye movements (nystagmus). DIAGNOSIS  Benign positional vertigo is usually diagnosed by physical exam. If the specific cause of your benign positional vertigo is unknown, your caregiver may perform imaging tests, such as magnetic resonance imaging (MRI) or computed tomography (CT). TREATMENT  Your caregiver may recommend movements or procedures to correct the benign positional vertigo. Medicines such as meclizine, benzodiazepines, and medicines for nausea may be used to treat your symptoms. In rare cases, if your symptoms are caused by certain conditions that affect the inner ear, you may need surgery. HOME CARE INSTRUCTIONS   Follow your caregiver's instructions.  Move slowly. Do not make sudden body or head movements.  Avoid driving.  Avoid operating heavy machinery.  Avoid performing any tasks that would be dangerous to you or others during a vertigo episode.  Drink enough fluids to keep your urine clear or pale yellow. SEEK IMMEDIATE MEDICAL CARE IF:   You develop problems with walking, weakness, numbness, or using your arms, hands, or legs.  You have difficulty speaking.  You develop  severe headaches.  Your nausea or vomiting continues or gets worse.  You develop visual changes.  Your family or friends notice any behavioral changes.  Your condition gets worse.  You have a fever.  You develop a stiff neck or sensitivity to light. MAKE SURE YOU:   Understand these instructions.  Will watch your condition.  Will get help right away if you are not doing well or get worse. Document Released: 05/18/2006 Document Revised: 11/02/2011 Document Reviewed: 04/30/2011 [facility] Patient Information 2015 [place], [place]. This information is not intended to replace advice given to you by your health care provider. Make sure you discuss any questions you have with your health care provider. Sit to Side-Lying   Sit on edge of bed.  1. Turn head 45 to right, Maintain head position and lie down slowly on left side. Hold until symptoms subside, plus 30 seconds.  2. Sit up slowly. Hold until symptoms subside.  3. Turn head 45 to left,  Maintain head position and lie down slowly on right side. Hold until symptoms subside, plus 30 seconds.  5. Sit up slowly. Repeat sequence _3-5___ times per session. Do __1-2__ sessions per day.  Copyright  VHI. All rights reserved.  Gaze Stabilization: Sitting   Keeping eyes on target held away from yourself, tilt head down 15-30 and move head side to side for 10-15 times. Repeat while moving head up and down for 10-15 times. Do _1-2___ sessions per day. Repeat using target on pattern background.  Copyright  VHI. All rights reserved.  Gaze Stabilization: Tip Card 1.Target must remain in focus, not blurry, and appear stationary while head is in motion. 2.Perform  exercises with small head movements (45 to either side of midline). 3.Increase speed of head motion so long as target is in focus. 4.If you wear eyeglasses, be sure you can see target through lens (therapist will give specific instructions for bifocal / progressive  lenses). 5.These exercises may provoke dizziness or nausea. Work through these symptoms. If too dizzy, slow head movement slightly. Rest between each exercise. 6.Exercises demand concentration; avoid distractions. 7.For safety, perform standing exercises close to a counter, wall, corner, or next to someone.  Copyright  VHI. All rights reserved.

## 2014-08-29 ENCOUNTER — Ambulatory Visit
Admission: RE | Admit: 2014-08-29 | Discharge: 2014-08-29 | Disposition: A | Discharge status: Home / Self Care | Payer: Medicare Other | Source: Ambulatory Visit | Attending: Neurology | Admitting: Neurology

## 2014-08-29 ENCOUNTER — Other Ambulatory Visit: Payer: Self-pay | Admitting: Family Medicine

## 2014-08-29 ENCOUNTER — Ambulatory Visit (INDEPENDENT_AMBULATORY_CARE_PROVIDER_SITE_OTHER): Payer: Medicare Other

## 2014-08-29 DIAGNOSIS — R404 Transient alteration of awareness: Secondary | ICD-10-CM

## 2014-08-29 DIAGNOSIS — R5382 Chronic fatigue, unspecified: Secondary | ICD-10-CM | POA: Diagnosis not present

## 2014-08-29 DIAGNOSIS — M62838 Other muscle spasm: Secondary | ICD-10-CM

## 2014-08-29 DIAGNOSIS — R4189 Other symptoms and signs involving cognitive functions and awareness: Secondary | ICD-10-CM | POA: Diagnosis not present

## 2014-08-29 DIAGNOSIS — R4020 Unspecified coma: Secondary | ICD-10-CM

## 2014-08-29 DIAGNOSIS — R4689 Other symptoms and signs involving appearance and behavior: Principal | ICD-10-CM

## 2014-08-29 DIAGNOSIS — R410 Disorientation, unspecified: Secondary | ICD-10-CM

## 2014-08-29 DIAGNOSIS — F919 Conduct disorder, unspecified: Secondary | ICD-10-CM | POA: Diagnosis not present

## 2014-08-29 DIAGNOSIS — H539 Unspecified visual disturbance: Secondary | ICD-10-CM

## 2014-08-29 MED ORDER — GADOBENATE DIMEGLUMINE 529 MG/ML IV SOLN: 16.0000 mL | Freq: Once | INTRAVENOUS | Status: AC | PRN

## 2014-08-29 MED ORDER — CYCLOBENZAPRINE HCL 10 MG PO TABS: ORAL_TABLET | ORAL | Status: DC

## 2014-08-29 NOTE — Telephone Encounter (Signed)
Pt called and needs a refill on her Flexeril called in. jw

## 2014-08-30 ENCOUNTER — Ambulatory Visit: Payer: Medicare Other

## 2014-09-04 ENCOUNTER — Encounter: Payer: Self-pay | Admitting: Family Medicine

## 2014-09-04 ENCOUNTER — Ambulatory Visit: Payer: Medicare Other

## 2014-09-04 ENCOUNTER — Ambulatory Visit (INDEPENDENT_AMBULATORY_CARE_PROVIDER_SITE_OTHER): Payer: Medicare Other | Admitting: Family Medicine

## 2014-09-04 ENCOUNTER — Telehealth: Payer: Self-pay | Admitting: Neurology

## 2014-09-04 VITALS — BP 120/83 | HR 78 | Temp 98.2°F | Ht 62.0 in | Wt 176.3 lb

## 2014-09-04 DIAGNOSIS — F4001 Agoraphobia with panic disorder: Secondary | ICD-10-CM | POA: Diagnosis not present

## 2014-09-04 DIAGNOSIS — E039 Hypothyroidism, unspecified: Secondary | ICD-10-CM | POA: Insufficient documentation

## 2014-09-04 DIAGNOSIS — Z87820 Personal history of traumatic brain injury: Secondary | ICD-10-CM | POA: Diagnosis not present

## 2014-09-04 DIAGNOSIS — E038 Other specified hypothyroidism: Secondary | ICD-10-CM

## 2014-09-04 DIAGNOSIS — G8929 Other chronic pain: Secondary | ICD-10-CM

## 2014-09-04 DIAGNOSIS — F418 Other specified anxiety disorders: Secondary | ICD-10-CM

## 2014-09-04 DIAGNOSIS — M549 Dorsalgia, unspecified: Secondary | ICD-10-CM

## 2014-09-04 DIAGNOSIS — R296 Repeated falls: Secondary | ICD-10-CM

## 2014-09-04 MED ORDER — LEVOTHYROXINE SODIUM 25 MCG PO TABS: 25.0000 ug | ORAL_TABLET | Freq: Every day | ORAL | Status: DC

## 2014-09-04 MED ORDER — CLONAZEPAM 1 MG PO TABS: 1.0000 mg | ORAL_TABLET | Freq: Two times a day (BID) | ORAL | Status: DC | PRN

## 2014-09-04 NOTE — Assessment & Plan Note (Signed)
Refilled Klonopin

## 2014-09-04 NOTE — Progress Notes (Signed)
   Subjective:    Patient ID: Wendy [name], female    DOB: 07-Nov-1960, 54 y.o.   MRN: [id number]  HPI  RECURRENT FALLS / DIZZINESS: - Chronic complaint with gradual worsening falls over past 1 year, associated with episodes dizziness - Last OV at [facility] for same complaint 06/29/14, since discussed plan via phone and has been seen by [facility] Neurology (Dr. Lavell Anchors) and in PT-neurorehab. Recent MRI (08/29/14) demonstrated stable findings since 2006, with known prior injury and encephalomalacia with gliosis. 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 Taking 1 week off PT, then continuing. - No recent falls - Denies vertigo, nausea, loss of vision, HA, active CP or SOB, palpitations  THYROID FOLLOW-UP: - Recently had labs done at Neurology office 07/23/14 with elevated TSH, advised to follow-up here. Already discussed over phone with plans to consider repeat labs today. Reviewed labs with similar elevated TSH to 2010 - Patient reports that hair seems to be "constantly shedding and falling out" - Admit general malaise/weakness, fatigue  BACK PAIN, CHRONIC: - Chronic hx known lumbar DJD, h/o osteopenia with compression fractures T12, L1 s/p kyphoplasty 08/2013 - Previously followed by Dr. Melina Schools ([place]) - Reports persistent gradual worsening low back pain, without significant change today. Continues to take Tramadol for pain, occasionally with radiating symptoms of pain down both legs - Denies any numbness or weakness, bladder or bowel incontinence, saddle anesthesia  HEPATITIS C: - Followed by [facility]. Stated she is almost complete with Harvoni course, has 5 more days of treatment left, then will repeat blood work in 3 months to confirm cure. Stated her last labs showed she is "cured"  but now needs to wait.  I have reviewed and updated the following as appropriate: allergies and current medications  Social Hx: - Former smoker  Review of Systems  See above HPI    Objective:   Physical Exam  BP 120/83 mmHg  Pulse 78  Temp(Src) 98.2 F (36.8 C) (Oral)  Ht 5\' 2"  (1.575 m)  Wt 176 lb 4.8 oz (79.969 kg)  BMI 32.24 kg/m2  Gen - chronically ill-appearing, obese, NAD HEENT - PERRL, EOMI, MMM Neck - supple, non-tender, no appreciable thyromegaly MSK - Back: mild tenderness to palpation over Lumbar spinous prcoesses and paraspinal muscles, reduced ROM flexion / ext, seated SLR with b/l reproducible low back pain and reported radicular symptoms Ext - no edema or asymmetry calves, peripheral pulses intact +2 b/l  Skin - warm, dry, no rashes Neuro - awake, alert, oriented, grossly non-focal    Assessment & Plan:   See specific A&P problem list for details.

## 2014-09-04 NOTE — Patient Instructions (Signed)
Dear Wendy [name], Thank you for coming in to clinic today.  1. 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 Ordered Back X-rays - you may go to [facility] at anytime to have these done. (T and L Spine X-rays) - After we review results and discuss plan. Most likely will have you follow-up with [place]: Melina Schools D MD Doctor Address: [address], [place], [place] [address] [phone] 3. For your blood pressure - today level is good. No change at this time. Continue medications 4. Refilled Klonopin  Continue Physical Therapy - let me know if you need any new orders or referral. Important to continue this and work on home exercises  Some important numbers from today's visit: BP - BP 120/83  Please call [facility] to ask - when your next follow-up with Dr. Lavell Anchors is scheduled for, and if you need to schedule it to review results of your MRI and Carotid Dopplers.  Please schedule a follow-up appointment with Dr. Parks Ranger in 6 to 8 weeks to repeat Thyroid labs.  If you have any other questions or concerns, please feel free to call the clinic to contact me. You may also schedule an earlier appointment if necessary.  However, if your symptoms get significantly worse, please go to the Emergency Department to seek immediate medical attention.  [name], DO [facility]   Hypothyroidism The thyroid is a large gland located in the lower front of your neck. The thyroid gland helps control metabolism. Metabolism is how your body handles food. It controls metabolism with the hormone thyroxine. When this gland is underactive (hypothyroid), it produces too little hormone.  CAUSES These include:   Absence or  destruction of thyroid tissue.  Goiter due to iodine deficiency.  Goiter due to medications.  Congenital defects (since birth).  Problems with the pituitary. This causes a lack of TSH (thyroid stimulating hormone). This hormone tells the thyroid to turn out more hormone. SYMPTOMS  Lethargy (feeling as though you have no energy)  Cold intolerance  Weight gain (in spite of normal food intake)  Dry skin  Coarse hair  Menstrual irregularity (if severe, may lead to infertility)  Slowing of thought processes Cardiac problems are also caused by insufficient amounts of thyroid hormone. Hypothyroidism in the newborn is cretinism, and is an extreme form. It is important that this form be treated adequately and immediately or it will lead rapidly to retarded physical and mental development. DIAGNOSIS  To prove hypothyroidism, your caregiver may do blood tests and ultrasound tests. Sometimes the signs are hidden. It may be necessary for your caregiver to watch this illness with blood tests either before or after diagnosis and treatment. TREATMENT  Low levels of thyroid hormone are increased by using synthetic thyroid hormone. This is a safe, effective treatment. It usually takes about four weeks to gain the full effects of the medication. After you have the full effect of the medication, it will generally take another four weeks for problems to leave. Your caregiver may start you on low doses. If you have had heart problems the dose may be gradually increased. It is generally not an emergency to get rapidly to normal. HOME CARE INSTRUCTIONS   Take your medications as your caregiver suggests. Let your caregiver  know of any medications you are taking or start taking. Your caregiver will help you with dosage schedules.  As your condition improves, your dosage needs may increase. It will be necessary to have continuing blood tests as suggested by your caregiver.  Report all suspected medication  side effects to your caregiver. SEEK MEDICAL CARE IF: Seek medical care if you develop:  Sweating.  Tremulousness (tremors).  Anxiety.  Rapid weight loss.  Heat intolerance.  Emotional swings.  Diarrhea.  Weakness. SEEK IMMEDIATE MEDICAL CARE IF:  You develop chest pain, an irregular heart beat (palpitations), or a rapid heart beat. MAKE SURE YOU:   Understand these instructions.  Will watch your condition.  Will get help right away if you are not doing well or get worse. Document Released: 08/10/2005 Document Revised: 11/02/2011 Document Reviewed: 03/30/2008 [facility] Patient Information 2015 [place], [place]. This information is not intended to replace advice given to you by your health care provider. Make sure you discuss any questions you have with your health care provider.

## 2014-09-04 NOTE — Telephone Encounter (Signed)
Repeat MRi of the brain showed no significant change from MRI on 03/25/05.Carotid doppler studies showed bilateral carotid bulb plaques and elevated blood flow velocity in the right carotid bulb consistent with 50-69% stenosis.  Recommend repeat carotid test in a year.  Tried calling patient, but there is no phone number on her record. Called her friend who gave me her number [phone]. Left a message.

## 2014-09-04 NOTE — Assessment & Plan Note (Signed)
Likely multifactorial with contributions from dizziness due to BPPV, also chronic debilitation from multiple co-morbidities, including hypothyroidism, chronic back pain, and likely poor balance with chronic traumatic brain injury as evidenced on MRI - Followed by Neurology (Dr. Lavell Anchors), last MRI 08/29/14 unchanged since 2006, stable encephalomalacia and gliosis from prior injury  Plan: 1. Cont PT for treatment BPPV, gait training and home str exercises 2. Treating hypothyroidism with levothyroxine replacement therapy 3. Cont using assistance devices when ambulating, emphasized precautions/need for assistance

## 2014-09-04 NOTE — Assessment & Plan Note (Signed)
Consistent with hypothyroidism with clinical features and recent elevated TSH (5.930, previous elevated TSH and low Free T4) without prior dx or treatment. Suspect this may be contributing to her chronic debilitation and frequent falls  Plan: 1. Decision to start low dose replacement therapy today with Levothyroxine 7mcg daily x 6 weeks then RTC to repeat labs (TSH, Free T4) and titrate up on dose as needed.

## 2014-09-04 NOTE — Assessment & Plan Note (Signed)
Stable chronic LBP, with h/o lumbar DJD and compression fractures s/p kyphoplasty T12-L1 in Jan 2015. Also with some persistent occasional radicular symptoms.  Plan: 1. Ordered Thoracic and Lumbar X-rays, pt to go to [facility] 2. Review results and discuss with pt, anticipate need to follow-up with Dr. Rolena Infante ([place]) for continued management of chronic back pain 3. Cont Tramadol, Tylenol PRN 4. Cont bisphosphonate therapy, calcitonin, Vitamin D

## 2014-09-04 NOTE — Assessment & Plan Note (Signed)
-   Followed by Neurology (Dr. Lavell Anchors), last MRI 08/29/14 unchanged since 2006, stable encephalomalacia and gliosis from prior injury

## 2014-09-05 NOTE — Telephone Encounter (Signed)
Patient returning Dr. Cathren Laine call.  Please return call @ [phone].

## 2014-09-06 ENCOUNTER — Ambulatory Visit: Payer: Medicare Other

## 2014-09-09 NOTE — Telephone Encounter (Signed)
Called back, patient not home. Left messsage. Will call back tomorrow.

## 2014-09-10 NOTE — Telephone Encounter (Signed)
Patient returning your call. She states she can be reached at the [phone] at anytime today.

## 2014-09-11 ENCOUNTER — Ambulatory Visit: Payer: Medicare Other

## 2014-09-11 ENCOUNTER — Telehealth: Payer: Self-pay

## 2014-09-11 NOTE — Telephone Encounter (Signed)
See telephone note.

## 2014-09-12 NOTE — Telephone Encounter (Signed)
Patient is calling to advise she did get the message and she does not need to talk further. Thank you.

## 2014-09-12 NOTE — Telephone Encounter (Signed)
Called patient again, answering machine. Cannot reach her, left message that she can call back if she wants to talk. I asked her to follow up with primary care to make sure she is aggressively managing risk factors such as cholesterol, blood pressure, glucose as well as daily asa for stroke prevention.

## 2014-10-05 ENCOUNTER — Telehealth: Payer: Self-pay | Admitting: Family Medicine

## 2014-10-05 DIAGNOSIS — R296 Repeated falls: Secondary | ICD-10-CM

## 2014-10-05 DIAGNOSIS — F418 Other specified anxiety disorders: Secondary | ICD-10-CM

## 2014-10-05 NOTE — Telephone Encounter (Signed)
Referrals made: 1. Ambulatory referral to PT - recommend to resume care at [facility] (ICD-10 diagnosis: recurrent falls, R29.6 2. 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  [name], [place], PGY-2

## 2014-10-05 NOTE — Telephone Encounter (Signed)
Patient calls requesting a new referral to [facility] for PT. She states she was only able to go to a few of her previous appts and would need a new referral. Also, patient would like to be referred somewhere that offers mental health counseling. She feels she is having a rough time right now and does not want to get to bad before asking for help. Please advise.

## 2014-10-09 ENCOUNTER — Other Ambulatory Visit: Payer: Self-pay | Admitting: Family Medicine

## 2014-10-10 ENCOUNTER — Other Ambulatory Visit: Payer: Self-pay | Admitting: Family Medicine

## 2014-10-10 DIAGNOSIS — L299 Pruritus, unspecified: Secondary | ICD-10-CM

## 2014-10-11 ENCOUNTER — Ambulatory Visit
Admission: RE | Admit: 2014-10-11 | Discharge: 2014-10-11 | Disposition: A | Discharge status: Home / Self Care | Payer: Medicare Other | Source: Ambulatory Visit | Attending: Family Medicine | Admitting: Family Medicine

## 2014-10-11 DIAGNOSIS — G8929 Other chronic pain: Secondary | ICD-10-CM

## 2014-10-11 DIAGNOSIS — M549 Dorsalgia, unspecified: Secondary | ICD-10-CM | POA: Diagnosis not present

## 2014-10-11 DIAGNOSIS — M47816 Spondylosis without myelopathy or radiculopathy, lumbar region: Secondary | ICD-10-CM | POA: Diagnosis not present

## 2014-10-11 DIAGNOSIS — M5136 Other intervertebral disc degeneration, lumbar region: Secondary | ICD-10-CM | POA: Diagnosis not present

## 2014-10-12 ENCOUNTER — Encounter: Payer: Self-pay | Admitting: Family Medicine

## 2014-10-15 ENCOUNTER — Telehealth: Payer: Self-pay | Admitting: Family Medicine

## 2014-10-15 DIAGNOSIS — G8929 Other chronic pain: Secondary | ICD-10-CM

## 2014-10-15 DIAGNOSIS — R296 Repeated falls: Secondary | ICD-10-CM

## 2014-10-15 DIAGNOSIS — M549 Dorsalgia, unspecified: Secondary | ICD-10-CM

## 2014-10-15 DIAGNOSIS — B182 Chronic viral hepatitis C: Secondary | ICD-10-CM | POA: Diagnosis not present

## 2014-10-15 DIAGNOSIS — K7469 Other cirrhosis of liver: Secondary | ICD-10-CM | POA: Diagnosis not present

## 2014-10-15 NOTE — Telephone Encounter (Signed)
Pt called and would like the doctor to put in a referral for her to have a ultra sound of her stomach. Her other doctor recommended that she get this done. Please call patient to discuss. jw

## 2014-10-16 NOTE — Telephone Encounter (Signed)
Needs another referral to [facility]. Also would like results from last weeks xrays

## 2014-10-17 NOTE — Telephone Encounter (Signed)
Reviewed chart, last OV 09/04/14. Called patient, spoke with [name] reviewed results and answered questions.  Reviewed recent T-L Spine X-rays (notable for worsening L5-S1 facet arthropathy, otherwise no acute findings, no fractures, stable T12-L1 kyphoplasty without changes), in setting of chronic back pain with established stable DJD, suspect some of her pain may be facetogenic. Advised patient to call Dr. Rolena Infante' [facility] office) to attempt to schedule follow-up to discuss alternative options for back pain, may benefit from facet injection. Placed Ortho referral if needed, pt may be able to schedule follow-up without this.  Placed order for ambulatory neuro-rehab outpatient, to re-establish with [facility] neuro-rehab for vestibular and balance. Pt had missed 2 appointments, needed new referral to reschedule continued follow-up.  Regarding request for abd [place]. Pt stated that she is followed by [facility] and she saw them recently, apparently since completing therapy on Harvoni for Hep C she will need serial abdominal ultrasounds, but they are not planning to follow-up with her, and wanted [facility] to follow-up abd [place]. She plans to clarify this with them and leaving [place] more details or having her Hepatologist call us with details. Currently not planning to order [place] until gain more information.  Advised her to schedule outpt f/u with me in 2-4 weeks to repeat TSH, see how she is doing on recent levothyroxine therapy.  [name], [place], PGY-2

## 2014-10-18 ENCOUNTER — Telehealth: Payer: Self-pay | Admitting: Family Medicine

## 2014-10-18 NOTE — Telephone Encounter (Signed)
Send ultrasound results [name]  [name] [id number]

## 2014-10-22 NOTE — Telephone Encounter (Signed)
Per PCP no ultrasound will be ordered right now (see phone note on 2/26). This was explained to patient by Wendy [name].

## 2014-10-23 ENCOUNTER — Telehealth: Payer: Self-pay | Admitting: Family Medicine

## 2014-10-23 NOTE — Telephone Encounter (Signed)
Is she going to be referred somewhere for her depression? Still hasnt heard from [facility] Please advise

## 2014-10-24 NOTE — Telephone Encounter (Signed)
Already addressed in recent telephone call dated 10/05/14. At that time placed order for Ambulatory referral to Psychology / [place] - (ICD-10 diagnosis: anxiety with depression, F41.8.  Could you check on this referral and update patient with status? I had initially advised her it may be some time before this apt is scheduled.  Thank you, [name], [place], PGY-2

## 2014-10-24 NOTE — Telephone Encounter (Signed)
Initial referral mistakenly placed in wrong workqueue, referral resent to neuro rehab and they will be contacting patient with appointment. Will forward to MD regarding depression.

## 2014-10-25 NOTE — Telephone Encounter (Signed)
Referral faxed to [facility] office). Called patient and left message updating on status of referrals, asked that she called back with any questions.

## 2014-10-26 ENCOUNTER — Other Ambulatory Visit: Payer: Self-pay | Admitting: *Deleted

## 2014-10-26 ENCOUNTER — Telehealth: Payer: Self-pay | Admitting: Family Medicine

## 2014-10-26 DIAGNOSIS — K5900 Constipation, unspecified: Secondary | ICD-10-CM

## 2014-10-26 MED ORDER — BISACODYL 5 MG PO TBEC
DELAYED_RELEASE_TABLET | ORAL | Status: DC
Start: 2014-10-26 — End: 2017-08-03

## 2014-10-26 NOTE — Telephone Encounter (Signed)
Reviewed chart. Last OV 09/04/14. She is followed by [facility] Hepatology (Dr. Monica Martinez) for history of Hep C (since treated with Harvoni therapy), at that time she had just finished her therapy and would follow-up at [facility] for labs. She did mention the request of the liver specialists to obtain Abdominal US routinely.  To clarify her current question, I did not tell her that she wasn't due for an [place]. My main concern was that I advised her to follow-up with Hepatology to obtain these screening ultrasounds as needed since they would be the ones to interpret and follow her in the future.  Given this new information with her PA Ned Card) and a timetable of q 6 month abd [place]. I attempted to call their office and speak with either [place] or Dr. Monica Martinez, but was unable to speak with anyone from the [facility] Psychiatric Liver Team [phone]), to get more information on what needs to be ordered, how often, and if they will continue to follow her to interpret results.  I attempted to return patient's call but did not reach her, LVM with these details, stating I will continue to try to reach [place] likely next week to clarify their plans. This is not urgent and we can work on it over next few weeks. If she has any other contact # or information for [place] or Dr. Monica Martinez, I asked if she could call us back and provide this.  [name], [place], PGY-2

## 2014-10-26 NOTE — Telephone Encounter (Signed)
Pt sees Dr. [name] with [facility] and he says she needs an u/s every 6 months, pt says pcp here told her it wasn't time but Dr. Vira Agar is telling her she is over due. Would like someone to call her.

## 2014-11-01 ENCOUNTER — Telehealth: Payer: Self-pay | Admitting: *Deleted

## 2014-11-01 DIAGNOSIS — K7460 Unspecified cirrhosis of liver: Secondary | ICD-10-CM

## 2014-11-01 DIAGNOSIS — Z1289 Encounter for screening for malignant neoplasm of other sites: Secondary | ICD-10-CM

## 2014-11-01 NOTE — Telephone Encounter (Signed)
[name] (provider with [facility]) called provider line requesting to speak with Dr. Parks Ranger regarding an ultrasound for patient.

## 2014-11-02 NOTE — Telephone Encounter (Addendum)
[name] again, spoke with nurse Wendy [name], works with Wendy [name] and Wendy [name]. She looked up Wendy [name]'s chart and recent notes. Reported last OV with Wendy [name] on 10/15/14 as follow-up, after completed Harvoni treatment. She confirms that request is for routine Complete Abdominal US screening in setting of cirrhosis to rule out any development of cancer. Patient was advised to get [place] completed locally and to send results to Wendy [name].  Entered future order for Complete Abdominal US (ICD-10 Z12.89 - screening malignant neoplasm), will contact patient and [name] nursing staff to confirm scheduling of this imaging study. Once obtained and results reviewed, will print Radiology read and fax to Wendy [name] at [phone]) attn: Wendy [name] / Wendy [name] for review. Most likely will need repeat in 6 months, will await further instruction from Wendy [name]. If any significant concerns on [place] reading, will notify Wendy [name] and they will plan on repeating US or further imaging with possible MRI in future as needed.  Note forwarded to [facility] Red Team - Wendy [name] to schedule [place] at [place] and to notify patient of scheduled time.  Wendy [name], [place], PGY-2

## 2014-11-02 NOTE — Telephone Encounter (Signed)
Continued to try to call [facility] (at number provided), about every day this week and still unable to reach anyone. Yesterday I was post-call and unable to take the call from [facility].  Will continue to try to reach [facility]. If he calls back, any further information would be appreciated regarding the ultrasound or an alternative contact number if possible.  [name], [place], PGY-2

## 2014-11-05 ENCOUNTER — Other Ambulatory Visit: Payer: Self-pay | Admitting: Family Medicine

## 2014-11-05 ENCOUNTER — Telehealth: Payer: Self-pay | Admitting: Family Medicine

## 2014-11-05 DIAGNOSIS — M439 Deforming dorsopathy, unspecified: Secondary | ICD-10-CM | POA: Diagnosis not present

## 2014-11-05 DIAGNOSIS — Z1231 Encounter for screening mammogram for malignant neoplasm of breast: Secondary | ICD-10-CM

## 2014-11-05 DIAGNOSIS — Z4789 Encounter for other orthopedic aftercare: Secondary | ICD-10-CM | POA: Diagnosis not present

## 2014-11-05 NOTE — Telephone Encounter (Signed)
Ultrasound scheduled for 3/18 at 9:15am at [facility] ([address] location). Mammogram also scheduled for 3/18 at 3pm at [facility], patient informed.

## 2014-11-05 NOTE — Telephone Encounter (Signed)
Is suppose to have ultrasound schedule for her belly per dr Neoma Laming. She would like to have mammogram done at the same time

## 2014-11-07 ENCOUNTER — Ambulatory Visit: Payer: Medicare Other

## 2014-11-09 ENCOUNTER — Other Ambulatory Visit: Payer: Medicare Other

## 2014-11-09 ENCOUNTER — Other Ambulatory Visit: Payer: Self-pay | Admitting: Family Medicine

## 2014-11-09 ENCOUNTER — Ambulatory Visit: Payer: Medicare Other

## 2014-11-09 DIAGNOSIS — K5900 Constipation, unspecified: Secondary | ICD-10-CM

## 2014-11-12 ENCOUNTER — Other Ambulatory Visit: Payer: Self-pay | Admitting: Family Medicine

## 2014-11-12 DIAGNOSIS — G8929 Other chronic pain: Secondary | ICD-10-CM

## 2014-11-12 DIAGNOSIS — M549 Dorsalgia, unspecified: Principal | ICD-10-CM

## 2014-11-12 DIAGNOSIS — M62838 Other muscle spasm: Secondary | ICD-10-CM

## 2014-11-12 MED ORDER — CYCLOBENZAPRINE HCL 10 MG PO TABS: ORAL_TABLET | ORAL | Status: DC

## 2014-11-12 MED ORDER — TRAMADOL HCL 50 MG PO TABS: 50.0000 mg | ORAL_TABLET | Freq: Three times a day (TID) | ORAL | Status: DC | PRN

## 2014-11-12 NOTE — Telephone Encounter (Signed)
Phoned in Tramadol  (#60, 2 refills) [facility].  [name], [place], PGY-2

## 2014-11-12 NOTE — Telephone Encounter (Signed)
Pt called and needs a refill on her Tramadol and flexeril. Blima Rich

## 2014-11-13 ENCOUNTER — Telehealth: Payer: Self-pay | Admitting: Family Medicine

## 2014-11-13 NOTE — Telephone Encounter (Signed)
Referral faxed to [facility] office) on 3/3

## 2014-11-13 NOTE — Telephone Encounter (Signed)
Left message on voicemail with this information along with the number to [facility] [phone]) for patient to follow up.

## 2014-11-13 NOTE — Telephone Encounter (Signed)
What happened to the referral to [facility]?

## 2014-11-16 ENCOUNTER — Ambulatory Visit
Admission: RE | Admit: 2014-11-16 | Discharge: 2014-11-16 | Disposition: A | Discharge status: Home / Self Care | Payer: Medicare Other | Source: Ambulatory Visit | Attending: Family Medicine | Admitting: Family Medicine

## 2014-11-16 DIAGNOSIS — K7460 Unspecified cirrhosis of liver: Secondary | ICD-10-CM

## 2014-11-16 DIAGNOSIS — Z1231 Encounter for screening mammogram for malignant neoplasm of breast: Secondary | ICD-10-CM | POA: Diagnosis not present

## 2014-11-16 DIAGNOSIS — Z9081 Acquired absence of spleen: Secondary | ICD-10-CM | POA: Diagnosis not present

## 2014-11-16 DIAGNOSIS — Z1289 Encounter for screening for malignant neoplasm of other sites: Secondary | ICD-10-CM

## 2014-11-16 DIAGNOSIS — K8020 Calculus of gallbladder without cholecystitis without obstruction: Secondary | ICD-10-CM | POA: Diagnosis not present

## 2014-11-16 DIAGNOSIS — R188 Other ascites: Secondary | ICD-10-CM | POA: Diagnosis not present

## 2014-11-19 ENCOUNTER — Telehealth: Payer: Self-pay | Admitting: Family Medicine

## 2014-11-19 DIAGNOSIS — F418 Other specified anxiety disorders: Secondary | ICD-10-CM

## 2014-11-19 NOTE — Telephone Encounter (Signed)
Pt called because she said we referred her to a behavioral clinic and when she called to make the appointment they told her it wasn't in [place]. Can we call and straighten this out. jw

## 2014-11-20 DIAGNOSIS — H02402 Unspecified ptosis of left eyelid: Secondary | ICD-10-CM | POA: Diagnosis not present

## 2014-11-20 DIAGNOSIS — H524 Presbyopia: Secondary | ICD-10-CM | POA: Diagnosis not present

## 2014-11-20 DIAGNOSIS — H1045 Other chronic allergic conjunctivitis: Secondary | ICD-10-CM | POA: Diagnosis not present

## 2014-11-20 DIAGNOSIS — H3531 Nonexudative age-related macular degeneration: Secondary | ICD-10-CM | POA: Diagnosis not present

## 2014-11-20 DIAGNOSIS — H04123 Dry eye syndrome of bilateral lacrimal glands: Secondary | ICD-10-CM | POA: Diagnosis not present

## 2014-11-20 NOTE — Telephone Encounter (Signed)
Hey Dr. Raliegh Ip, they referral was faxed to behavioral health on 3/3 but apparently they wont schedule her since no referral is in epic, do you mind placing referral please. Thanks!

## 2014-11-22 ENCOUNTER — Other Ambulatory Visit: Payer: Self-pay | Admitting: *Deleted

## 2014-11-22 ENCOUNTER — Encounter: Payer: Self-pay | Admitting: Family Medicine

## 2014-11-22 DIAGNOSIS — I10 Essential (primary) hypertension: Secondary | ICD-10-CM

## 2014-11-22 MED ORDER — CARVEDILOL 12.5 MG PO TABS: 12.5000 mg | ORAL_TABLET | Freq: Two times a day (BID) | ORAL | Status: DC

## 2014-11-22 NOTE — Progress Notes (Signed)
Reviewed chart. 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 Followed by [facility] / Dr. Monica Martinez.  Abdominal US, Complete (11/16/14) IMPRESSION: 1. Heterogeneous nodular liver consistent with patient's known cirrhosis. No focal hepatic mass. Splenectomy. 2. Multiple gallstones. Gallbladder wall is slightly thickened, this may be secondary to hypoproteinemia. Cholecystitis cannot be entirely excluded. Negative Murphy sign. 3. Right pleural effusion. Mild ascites.  Reviewed results. No urgent or concerning findings. Printed copy of results today and placed to be faxed at [facility] to [facility] [phone]) to [name] / Dr. Monica Martinez for review.  Follow-up recommendations from [facility] for next routine Abd [place], likely 6 to 12 months.  [name], [place], PGY-2

## 2014-11-22 NOTE — Telephone Encounter (Signed)
I reviewed the recent phone notes and prior referrals. I had previously signed orders in Epic for this referral. Looks like before I used the "Ambulatory Referral to Psychology" and selected Behavioral Health. This time I signed the Ambulatory Referral to Psychiatry and selected [facility].  Let me know if this doesn't work or needs to be changed again.  [name], [place], PGY-2

## 2014-11-29 ENCOUNTER — Ambulatory Visit: Payer: Medicare Other | Admitting: Family Medicine

## 2014-11-29 ENCOUNTER — Telehealth: Payer: Self-pay | Admitting: Family Medicine

## 2014-11-29 DIAGNOSIS — I10 Essential (primary) hypertension: Secondary | ICD-10-CM

## 2014-11-29 DIAGNOSIS — E038 Other specified hypothyroidism: Secondary | ICD-10-CM

## 2014-11-29 DIAGNOSIS — E669 Obesity, unspecified: Secondary | ICD-10-CM

## 2014-11-29 DIAGNOSIS — F4001 Agoraphobia with panic disorder: Secondary | ICD-10-CM

## 2014-11-29 MED ORDER — CLONAZEPAM 1 MG PO TABS: 1.0000 mg | ORAL_TABLET | Freq: Two times a day (BID) | ORAL | Status: DC | PRN

## 2014-11-29 NOTE — Telephone Encounter (Addendum)
Reviewed telephone notes. Called patient back, spoke with Wendy [name] regarding the following issues:  1. Klonopin Refill / (unable to make apt today) - I advised her that I would agree to refill Klonopin x 3 months. [facility], [facility]illed Klonopin 1mg  PO BID PRN anxiety (#60, 2 refills) for 3 month supply. Last refilled in 08/2014.  2. Thyroid Check - I advised her that I would go ahead and place Future Lab Orders for TSH and Free T4, also added fasting Lipid Panel, to be available for patient if she can schedule "Lab Only" visit between now and 4/21 to get blood drawn before. If not, we will get labs at her upcoming visit.  3. Behavioral Health Referral - Notified today by Levert Feinstein, RN that [facility] does not accept her insurance. Initial referral was for counseling / therapy. Next best option for her would be [facility]). I discussed this with patient, and provided her website and telephone number, advised her to call and arrange plans to become established as new patient, likely through walk-in hours. If needed will provided additional handout information at next visit.  Follow-up already scheduled with me on 12/13/14 at 9:30am - confirmed apt with patient.  [name], [place], PGY-2

## 2014-11-29 NOTE — Telephone Encounter (Signed)
Pt could not make appt today due to no transportation, wants to know if MD will still prescribe her klonopin until she can make it here.

## 2014-11-29 NOTE — Telephone Encounter (Signed)
Pt calling back, scheduled an appt for 4/21, is on the waiting list in case someone cancels before then. Wants to know if MD wants her to come in to have her thyroid checked before her appt?

## 2014-11-29 NOTE — Telephone Encounter (Signed)
Behavioral Health not currently accepting patients insurance, will give patient information on [facility] at appointment today.

## 2014-12-03 ENCOUNTER — Other Ambulatory Visit: Payer: Self-pay | Admitting: Family Medicine

## 2014-12-03 DIAGNOSIS — M439 Deforming dorsopathy, unspecified: Secondary | ICD-10-CM | POA: Diagnosis not present

## 2014-12-03 DIAGNOSIS — F4001 Agoraphobia with panic disorder: Secondary | ICD-10-CM

## 2014-12-03 NOTE — Telephone Encounter (Signed)
Pt called and needs a refill on her Klonopin jw

## 2014-12-05 MED ORDER — CLONAZEPAM 1 MG PO TABS: 1.0000 mg | ORAL_TABLET | Freq: Two times a day (BID) | ORAL | Status: DC | PRN

## 2014-12-05 NOTE — Telephone Encounter (Signed)
Phoned in refill on 11/29/14. See telephone note. Re-signed phone in rx today to confirm.

## 2014-12-06 NOTE — Therapy (Signed)
[facility] [address] [place], [place], [address] Phone: [phone]   Fax:  [phone]  Patient Details  Name: Wendy [name] MRN: [id number] Date of Birth: 05-Nov-1960 Referring Provider:  No ref. provider found  Encounter Date: 12/06/2014  PHYSICAL THERAPY DISCHARGE SUMMARY  Visits from Start of Care: 2  Current functional level related to goals / functional outcomes: Unknown. Pt did not return beyond her second therapy visit.   Remaining deficits: On second visit pt was treated for BPPV, unknown of whether this was effectively cleared as she did not return. In addition, she had gait and balance impairment.   Education / Equipment: Home exercise program to address balance/BPPV  Plan: Patient agrees to discharge.  Patient goals were not met. Patient is being discharged due to not returning since the last visit.  ?????      Delrae Sawyers, PT,DPT,NCS 12/06/2014 2:38 PM Phone [phone] FAX [phone]         [facility] [address] [place], [place], [address] Phone: [phone]   Fax:  [phone]

## 2014-12-10 DIAGNOSIS — M439 Deforming dorsopathy, unspecified: Secondary | ICD-10-CM | POA: Diagnosis not present

## 2014-12-13 ENCOUNTER — Telehealth: Payer: Self-pay | Admitting: Family Medicine

## 2014-12-13 ENCOUNTER — Ambulatory Visit (INDEPENDENT_AMBULATORY_CARE_PROVIDER_SITE_OTHER): Payer: Medicare Other | Admitting: Family Medicine

## 2014-12-13 ENCOUNTER — Encounter: Payer: Self-pay | Admitting: Family Medicine

## 2014-12-13 ENCOUNTER — Ambulatory Visit (HOSPITAL_COMMUNITY): Payer: Medicare Other | Admitting: Physician Assistant

## 2014-12-13 VITALS — BP 114/78 | HR 74 | Temp 97.8°F | Ht 62.0 in | Wt 189.4 lb

## 2014-12-13 DIAGNOSIS — I10 Essential (primary) hypertension: Secondary | ICD-10-CM

## 2014-12-13 DIAGNOSIS — E038 Other specified hypothyroidism: Secondary | ICD-10-CM | POA: Diagnosis not present

## 2014-12-13 DIAGNOSIS — M549 Dorsalgia, unspecified: Secondary | ICD-10-CM | POA: Diagnosis not present

## 2014-12-13 DIAGNOSIS — R296 Repeated falls: Secondary | ICD-10-CM | POA: Diagnosis not present

## 2014-12-13 DIAGNOSIS — F418 Other specified anxiety disorders: Secondary | ICD-10-CM | POA: Diagnosis not present

## 2014-12-13 DIAGNOSIS — M47816 Spondylosis without myelopathy or radiculopathy, lumbar region: Secondary | ICD-10-CM | POA: Diagnosis not present

## 2014-12-13 DIAGNOSIS — G8929 Other chronic pain: Secondary | ICD-10-CM | POA: Diagnosis not present

## 2014-12-13 DIAGNOSIS — E669 Obesity, unspecified: Secondary | ICD-10-CM

## 2014-12-13 LAB — T4, FREE: Free T4: 0.86 ng/dL (ref 0.80–1.80)

## 2014-12-13 LAB — TSH: TSH: 7.514 u[IU]/mL — AB (ref 0.350–4.500)

## 2014-12-13 MED ORDER — TRAMADOL HCL 50 MG PO TABS: 50.0000 mg | ORAL_TABLET | Freq: Three times a day (TID) | ORAL | Status: DC | PRN

## 2014-12-13 NOTE — Telephone Encounter (Signed)
Pt would like for PCP to return her call about confusion on referrals / thanks [employer], ASA

## 2014-12-13 NOTE — Assessment & Plan Note (Signed)
Followed by Dr. Rolena Infante (Ortho), last seen 1 week ago. - Continue with PT per Ortho

## 2014-12-13 NOTE — Assessment & Plan Note (Addendum)
Wt gain +10 lbs in 5 months, overall down 10 lbs in 1 year  Plan: 1. Discussed healthy lifestyle modifications 2. Exercise goal - Start walking qod, limited mobility due to chronic LBP and dizziness 3. Dietary goal - reduce diet sodas, tea, increase water. Reduce portion size 4. Check lipid panel - however note discovered after lab draw, pt not fasting

## 2014-12-13 NOTE — Addendum Note (Signed)
Addended by: Olin Hauser on: 12/13/2014 12:58 PM   Modules accepted: Orders

## 2014-12-13 NOTE — Progress Notes (Signed)
   Subjective:    Patient ID: Wendy [name], female    DOB: 1960/10/04, 54 y.o.   MRN: [id number]  HPI  THYROID FOLLOW-UP: - Last visit 08/2014 started on levothyroxine 25 mcg daily for presumed hypothyroidism given symptoms and with mildly elevated TSH 5.9 in 06/2014 - Requested pt return for repeat labs today, TSH, Free T4 - Compliant with levothyroxine, tolerating without complaints  BACK PAIN, CHRONIC: - Chronic hx known lumbar DJD, h/o osteopenia with compression fractures T12, L1 s/p kyphoplasty 08/2013 - Followed by Dr. Melina Schools ([place]) - last seen 1 week ago, no significant changes to course. No plans for surgery or repeat imaging. Has referred to PT for LBP, overall tolerating well during therapy but has soreness and inc pain following for up to 2 days. - Reports persistent low back pain, without significant change today. Infrequent pain radiating down legs, more regular radiations across flanks to abdomen - Taking Flexeril half tab 5mg  daily (no significant help), Tramadol 50mg  (1-2x daily if bad, mild relief). Improved with heating pad - Concerned about limitations with chronic LBP on her function. Not requesting any specific medication or therapy - Denies any numbness or weakness, bladder or bowel incontinence, saddle anesthesia  H/O DEPRESSION: - Chronic history of mood disorder and anxiety - Previously referred to [facility] - unable to accept pt due to ins - Given information on establishing with [facility] for counseling / therapy, and psychiatry. Patient plans to do walk-in for initial assessment, but has not done this yet - Denies depressed mood, SI/HI  RECURRENT FALLS / DIZZINESS: - Chronic complaint with gradual worsening falls over past 1 year, associated with episodes dizziness - Followed by [facility]rology (Dr. Lavell Anchors). Last MRI (08/29/14) demonstrated stable findings since 2006, with known prior injury and encephalomalacia with gliosis. -  Previously with Neuro-rehab for gait / balance therapy - some improvement with treatment for BPPV, had 2 sessions, plans to follow-up with rehab - No recent falls - Denies vertigo, nausea, loss of vision, HA, active CP or SOB, palpitations  I have reviewed and updated the following as appropriate: allergies and current medications  Social Hx: - Former smoker  Review of Systems  See above HPI    Objective:   Physical Exam  BP 114/78 mmHg  Pulse 74  Temp(Src) 97.8 F (36.6 C) (Oral)  Ht 5\' 2"  (1.575 m)  Wt 189 lb 6.4 oz (85.911 kg)  BMI 34.63 kg/m2  Gen - chronically ill-appearing, obese, NAD HEENT - MMM Neck - supple, with mild anterior lower neck generalized edema without focal enlarged thyroid or nodule, no palpable lymph node, non-tender MSK - Back: mild tenderness to palpation over bilateral Lumbar paraspinal muscles with hypertonicity, reduced ROM flexion / ext, seated SLR with b/l reproducible low back pain and without significant radicular symptoms Ext - no edema, non-tender, peripheral pulses intact +2 b/l  Skin - warm, dry, no rashes Neuro - awake, alert, oriented     Assessment & Plan:   See specific A&P problem list for details.

## 2014-12-13 NOTE — Assessment & Plan Note (Signed)
Consistent with hypothyroidism given symptoms with mostly fatigue, wt gain, also likely multifactorial with chronic debilitation and frequent falls. Some mild improvement with reduced fatigue on levothyroxine - Last TSH mild elevated 5.930  Plan: 1. Check TSH, Free T4 today 2. Continue levothyroxine 25 mcg at this time, will review results and titrate up dose as needed

## 2014-12-13 NOTE — Assessment & Plan Note (Addendum)
No recent falls. Likely multifactorial with contributions from dizziness due to BPPV, also chronic debilitation from multiple co-morbidities, including hypothyroidism, chronic back pain, and likely poor balance with chronic traumatic brain injury as evidenced on MRI - Followed by Neurology (Dr. Lavell Anchors), last MRI 08/29/14 unchanged since 2006, stable encephalomalacia and gliosis from prior injury  Plan: 1. Plan to resume PT for treatment BPPV, gait training and home str exercises - will place referral if needed 2. Continue levothyroxine 3. Cont using assistance devices when ambulating, emphasized precautions/need for assistance

## 2014-12-13 NOTE — Assessment & Plan Note (Signed)
Currently controlled mood on Seroquel. Chronic h/o mood disorder, primarily depression. - Unable to be referred to [facility]: 1. Encouraged establishing with [facility] via walk-in for routine counseling / therapy and psychiatry

## 2014-12-13 NOTE — Patient Instructions (Addendum)
Dear Wendy [name], Thank you for coming in to clinic today.  1. For your Thyroid - We checked blood work today - will call you and phone in new rx if need to increase dosage. 2. Check cholesterol today  3. For your blood pressure - today level is good. No change at this time. Continue medications 4. Weight Loss - Keep your goals to start walking every other day, and improve dietary changes. Increase water and decrease diet sodas, teas or other drinks. - Reduce portion size, increase vegetables and reduce carbs.  For Back Pain - you may take Flexeril up to 2 times daily, and Tramadol up to 3 times daily.  Continue Physical Therapy - let me know if you need any new orders or referral. Important to continue this and work on home exercises  Some important numbers from today's visit: BP - BP 114/78  Referral to Physical Medicine and Rehabilitation - therapy, chronic management of back pain, goal to improve function, consider pain management  Please schedule a follow-up appointment with Dr. Parks Ranger in 3 months for follow-up Back Pain  If you have any other questions or concerns, please feel free to call the clinic to contact me. You may also schedule an earlier appointment if necessary.  However, if your symptoms get significantly worse, please go to the Emergency Department to seek immediate medical attention.  Wendy [name], [place]

## 2014-12-13 NOTE — Assessment & Plan Note (Signed)
Gradual worsening chronic LBP, with h/o lumbar DJD and compression fractures s/p kyphoplasty T12-L1 in Jan 2015. Also with some occasional radicular symptoms. Suspect some pain may be facetogenic vs radicular. - Last imaging T-L Spine X-rays (worsening L5-S1 facet arthropathy, otherwise no fractures, stable T12-L1 kyphoplasty) - Followed by Ortho (Dr. Rolena Infante)  Plan: 1. Continue medical management - Tramadol 50mg  TID PRN (refilled, inc to #90, 2 refills, printed given today), encouraged pt may take up to 2 to 3 tablets daily, previously 1-2 without relief) 2. Continue Flexeril 5mg  (half of 10mg ) up to TID - encouraged to take up to 2 times daily if needed, currently taking daily 3. Continue heating pad with relief 4. Discussed alternative pain medications - Limited options given cirrhosis. Considered Cymbalta (contraindicated hepatic insufficiency), concern with TCA due to anticholinergic side-effects, consider gabapentin (if radicular component of pain, unlikely to resolve OA pain, also pt gaining wt). Considered opiates (pt previously on short courses with relief, reviewed opiate risk tool score 8 (lowest end of high risk, Fam Hx alcohol (1+3), Personal hx alcohol (3), Psych (depression 1). 5. Referral to PMR to establish care, functional evaluation, consider epidural injections vs chronic pain management 6. RTC 3 months - consider switch Flexeril to baclofen, trial gabapentin, in future if starting opiates would need [name] pain contract and UDS

## 2014-12-14 ENCOUNTER — Telehealth: Payer: Self-pay | Admitting: Family Medicine

## 2014-12-14 DIAGNOSIS — E038 Other specified hypothyroidism: Secondary | ICD-10-CM

## 2014-12-14 LAB — LIPID PANEL
Cholesterol: 105 mg/dL (ref 0–200)
HDL: 5 mg/dL — ABNORMAL LOW (ref >=46)
LDL CALC: 71 mg/dL (ref 0–99)
Total CHOL/HDL Ratio: 21 Ratio
Triglycerides: 146 mg/dL (ref <150)
VLDL: 29 mg/dL (ref 0–40)

## 2014-12-14 MED ORDER — LEVOTHYROXINE SODIUM 50 MCG PO TABS: 50.0000 ug | ORAL_TABLET | Freq: Every day | ORAL | Status: DC

## 2014-12-14 NOTE — Telephone Encounter (Addendum)
Reviewed lab results (TSH, Free T4) from last OV 12/13/14. Overall results with elevated TSH and normal range Free T4, consistent with worsening subclinical hypothyroidism, with symptoms.  Recommendation - Increase dose of levothyroxine from 25 mcg daily to 50 mcg daily, sent new rx to pharmacy for 50 mcg tablets. Advised to finish current supply of 25 mcg tablets by taking 2 at a time daily (30 min to 1 hour before breakfast), then when finished start taking new rx.  She will need to return to [facility] for repeat Thyroid Labs - TSH, Free T4 (future orders in place) in 3 to 6 weeks for re-check.  Called patient, discussed above lab results. She plans to return in 3 weeks for Lab visit to re-check Thyroid labs. Will finish her 25 mcg levothyroxine by taking x 2 tabs for now, pick-up 50 mcgs. Additionally, clarified about Behavioral health referral, she apparently now has an appointment Jan 03, 2015. I advised her to go to this appointment and follow-up as needed, if this apt doesn't work or not covered by ins, we can always return with prior plan to go to [facility] and establish. Also waiting on follow-up from PMR referral.  [name], [place], PGY-2

## 2014-12-14 NOTE — Telephone Encounter (Signed)
Phone call completed, patient will keep appointment with [place].

## 2014-12-17 DIAGNOSIS — B182 Chronic viral hepatitis C: Secondary | ICD-10-CM | POA: Diagnosis not present

## 2014-12-17 DIAGNOSIS — Z79899 Other long term (current) drug therapy: Secondary | ICD-10-CM | POA: Diagnosis not present

## 2014-12-24 ENCOUNTER — Other Ambulatory Visit: Payer: Self-pay | Admitting: Family Medicine

## 2014-12-24 DIAGNOSIS — F418 Other specified anxiety disorders: Secondary | ICD-10-CM

## 2014-12-25 DIAGNOSIS — M439 Deforming dorsopathy, unspecified: Secondary | ICD-10-CM | POA: Diagnosis not present

## 2014-12-27 ENCOUNTER — Telehealth: Payer: Self-pay | Admitting: Family Medicine

## 2014-12-27 DIAGNOSIS — G4700 Insomnia, unspecified: Secondary | ICD-10-CM | POA: Insufficient documentation

## 2014-12-27 MED ORDER — TRAZODONE HCL 50 MG PO TABS: 25.0000 mg | ORAL_TABLET | Freq: Every evening | ORAL | Status: DC | PRN

## 2014-12-27 NOTE — Telephone Encounter (Signed)
Wants dr to call in [facility] [address]

## 2014-12-27 NOTE — Telephone Encounter (Signed)
Reviewed chart. Prior history of insomnia with treatment on Trazodone 25-50mg  qhs PRN. Concern for sedation with other sleep aids given polypharmacy and issues with balance / dizziness. Called patient and spoke with Wendy [name], agreed to send new rx for Trazodone 50mg  tablets (take 0.5 to 1 tablet) qhs PRN sleep (#30, 1 refill), advised her to start with 0.5 (half) tab qhs PRN for 3-5 days, then if needed may increase to whole tablet, advised caution with sedation. Recommend to follow-up in future.  Additionally, she reported that Wendy [name] plans to draw labs 01/16/15 to test for cure Hep C. She will keep me updated on this at next visit.  Wendy [name], [place], PGY-2

## 2015-01-03 ENCOUNTER — Ambulatory Visit (INDEPENDENT_AMBULATORY_CARE_PROVIDER_SITE_OTHER): Payer: Medicare Other | Admitting: Psychiatry

## 2015-01-03 ENCOUNTER — Telehealth: Payer: Self-pay | Admitting: Family Medicine

## 2015-01-03 ENCOUNTER — Encounter (HOSPITAL_COMMUNITY): Payer: Self-pay | Admitting: Psychiatry

## 2015-01-03 VITALS — BP 132/82 | HR 73 | Ht 62.0 in | Wt 194.0 lb

## 2015-01-03 DIAGNOSIS — F411 Generalized anxiety disorder: Secondary | ICD-10-CM | POA: Diagnosis not present

## 2015-01-03 DIAGNOSIS — F0630 Mood disorder due to known physiological condition, unspecified: Secondary | ICD-10-CM

## 2015-01-03 DIAGNOSIS — F331 Major depressive disorder, recurrent, moderate: Secondary | ICD-10-CM

## 2015-01-03 DIAGNOSIS — M549 Dorsalgia, unspecified: Secondary | ICD-10-CM

## 2015-01-03 DIAGNOSIS — G8929 Other chronic pain: Secondary | ICD-10-CM

## 2015-01-03 DIAGNOSIS — F4001 Agoraphobia with panic disorder: Secondary | ICD-10-CM

## 2015-01-03 MED ORDER — ESCITALOPRAM OXALATE 10 MG PO TABS: 5.0000 mg | ORAL_TABLET | Freq: Every day | ORAL | Status: DC

## 2015-01-03 NOTE — Telephone Encounter (Signed)
Spoke with patient briefly, then decided to call her optometrist for clarifying information to better answer her question. She was previously taking Vitamin D3 OTC 2,000 IU caps (up to ?5 daily), seems to be discrepancy with med rec, as previously she had agreed to taking the Ca-Vit D supplement on her med rec.  Followed by Optometry Dr. Alois Cliche [facility]) for yearly dilated eye exams. 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 Recently had annual eye exam, advised that her vision has worsened, given new corrective lens rx. Additionally, she has Early Macular Degeneration, advised to start Centrum Silver. Dr. Idolina Primer advised that this would not interfere with her taking Vitamin D.  Called patient back, LVM for her that I had spoken with Dr. Idolina Primer. Recommend that she may continue to take Vitamin D3, 2,000u capsule daily along with Centrum Silver for women daily (800iu).  [name], [place], PGY-2

## 2015-01-03 NOTE — Telephone Encounter (Signed)
Eye dr changed her medication because there had been a drastic change in her eye. Dr suggested stopping Vitamin D and start Centrum because it has something in it that will Help the eye Please advise

## 2015-01-03 NOTE — Progress Notes (Signed)
Patient ID: Wendy [name], female   DOB: 09-16-1960, 54 y.o.   MRN: [id number]  [facility] [id number] 54 y.o.  01/03/2015 2:42 PM  Chief Complaint:  Depression, establish care  History of Present Illness:   Patient Presents for Initial Evaluation with symptoms of depression. Wendy [name] is 54 years old currently single Caucasian female referred by Dr. Sheppard Coil for depression. She has multiple medical conditions including history of broken hip before 4 years ago that exacerbated her symptoms of depression. She is also being managed for anxiety, panic symptoms including agoraphobia. She has history of hepatitis C which is now cured. She has history of fall and multiple medical conditions which she follows up with her primary care physician including hypothyroidism that can be affecting her depression also.  She endorses feeling down, depressed decreased interest withdrawn. She does not want to go out she has fear of open spaces she has panic attacks when she goes out. She is taking Klonopin that does help her twice a day. Does not feel worthless but that she does feel helpless with some crying spells at times and feeling down decreased interest. She is on Seroquel as a mood stabilizer that helps her sleep and mood symptoms she also takes trazodone for sleep at night  She does endorse excessive worries, unreasonable at times she worries about physical health she worries that she is lonely.  Aggravating factors; physical health, broken hip 4 years ago. She feels lonely. Multiple medical issues. Her husband died 72 years ago on Valentine's Day. She is provided with the holidays coming and anniversaries of deaths. Modifying factors; she attends the Art sales she tries to go for a walk.  Severity of depression; 4 out of 10. 10 being no depression Duration; more than 10:15 years. Worse in the last 4 years Medical complexity;  reviewed records. She does have hypothyroidism. History of hepatitis C. Hip surgeries. All these conditions can exacerbate her depression.  There is no associated symptoms of psychosis, paranoia. Does not endorse suicidal or homicidal thoughts. There is no history of physical or sexual trauma.  There is no history of using alcohol on a regular basis or drug abuse.    Past Psychiatric History/Hospitalization(s) Denies. She is follow up with her primary care physician on treatment for depression and anxiety but no psychiatric admissions or psychiatrist follow-up.  Hospitalization for psychiatric illness: No History of Electroconvulsive Shock Therapy: No Prior Suicide Attempts: No  Medical History; Past Medical History  Diagnosis Date  . Hepatic cirrhosis due to chronic hepatitis C infection     ETOH causative as well  . Hiatal hernia   . Esophageal stricture     esophageal dysmotility and chronic dysphagia as well  . 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 History of alcohol abuse   . Anxiety and depression   . History of hip fracture   . Allergic rhinitis   . HTN (hypertension)   . Blood transfusion   . Cataract     MILD  . GERD (gastroesophageal reflux disease)   . Osteoporosis   . Ulcer     2007  . Arthritis   . Clotting disorder     prolonged clotting time due to liver disease  . Paraesophageal hernia   . Compression fracture 09/21/2013  . Hepatic encephalopathy syndrome     Allergies: Allergies  Allergen Reactions  . Codeine Phosphate Itching  . Codeine Rash  Medications: Outpatient Encounter Prescriptions as of 01/03/2015  Medication Sig  . alendronate (FOSAMAX) 70 MG tablet Take 70 mg by mouth.  Marland Kitchen amoxicillin (AMOXIL) 500 MG capsule TAKE 4 CAPSULES BY MOUTH 30-60 MINUTES PRIOR TO PROCEDURE (Patient not taking: Reported on 12/13/2014)  . bisacodyl ([employer] GENTLE LAXATIVE) 5 MG EC tablet TAKE 1 TABLET (5 MG TOTAL) BY MOUTH DAILY.  .  calcitonin, salmon, (MIACALCIN/FORTICAL) 200 UNIT/ACT nasal spray Place 1 spray into alternate nostrils daily.  . carvedilol (COREG) 12.5 MG tablet Take 1 tablet (12.5 mg total) by mouth 2 (two) times daily with a meal.  . Cholecalciferol (VITAMIN D3) 2000 UNITS capsule Take 2,000 Units by mouth daily.  . clonazePAM (KLONOPIN) 1 MG tablet Take 1 tablet (1 mg total) by mouth 2 (two) times daily as needed for anxiety.  . cyclobenzaprine (FLEXERIL) 10 MG tablet TAKE 1/2 TABLET 3 TIMES A DAY AS NEEDED FOR MUSCLE SPASM  . cycloSPORINE (RESTASIS) 0.05 % ophthalmic emulsion Place 1 drop into both eyes 2 (two) times daily as needed (For dry eyes.).  Marland Kitchen docusate sodium (COLACE) 100 MG capsule TAKE ONE CAPSULE BY MOUTH TWICE A DAY  . escitalopram (LEXAPRO) 10 MG tablet Take 0.5 tablets (5 mg total) by mouth daily.  . furosemide (LASIX) 40 MG tablet Take 0.5 tablets (20 mg total) by mouth every morning. (Patient taking differently: Take 20 mg by mouth every other day. )  . glycerin adult (GLYCERIN ADULT) 2 G SUPP Place 1 suppository rectally daily as needed for moderate constipation.  . hydrOXYzine (ATARAX/VISTARIL) 25 MG tablet TAKE 1/2 TABLET BY MOUTH EVERY 6 HOURS AS NEEDED FOR ITCHING  . lactulose (CHRONULAC) 10 GM/15ML solution Take 45 mLs (30 g total) by mouth 4 (four) times daily as needed (for constipation).  . Ledipasvir-Sofosbuvir (HARVONI) 90-400 MG TABS Take by mouth.  . levothyroxine (SYNTHROID, LEVOTHROID) 50 MCG tablet Take 1 tablet (50 mcg total) by mouth daily before breakfast.  . Multiple Vitamins-Minerals (CENTRUM SILVER ULTRA WOMENS PO) Take by mouth.  . Olopatadine HCl 0.2 % SOLN Place 1 drop into both eyes every morning.   Marland Kitchen omeprazole (PRILOSEC) 20 MG capsule   . ondansetron (ZOFRAN ODT) 4 MG disintegrating tablet Take 1 tablet (4 mg total) by mouth every 8 (eight) hours as needed for nausea or vomiting.  . polyethylene glycol (MIRALAX) packet Take 17 g by mouth 2 (two) times daily.  .  QUEtiapine (SEROQUEL) 200 MG tablet TAKE 1 TABLET BY MOUTH AT BEDTIME  . ribavirin (COPEGUS) 200 MG tablet   . rifaximin (XIFAXAN) 550 MG TABS tablet Take 550 mg by mouth 2 (two) times daily.  Marland Kitchen spironolactone (ALDACTONE) 100 MG tablet TAKE 1 TABLET EVERY DAY  . traMADol (ULTRAM) 50 MG tablet Take 1 tablet (50 mg total) by mouth every 8 (eight) hours as needed (For pain.).  Marland Kitchen traZODone (DESYREL) 50 MG tablet Take 0.5-1 tablets (25-50 mg total) by mouth at bedtime as needed for sleep.  . [DISCONTINUED] calcium-vitamin D (OSCAL-500) 500-400 MG-UNIT per tablet Take 2 tablets by mouth 2 (two) times daily.   . [DISCONTINUED] Prenatal 28-0.8 MG TABS Take 1 tablet by mouth daily.   No facility-administered encounter medications on file as of 01/03/2015.     Substance Abuse History:   Family History; Family History  Problem Relation Age of Onset  . Heart disease Mother   . Anxiety disorder Mother   . Colon cancer Neg Hx   . Other Daughter     chromosome abnormalit  .  Other Daughter     micropthalmia      Biopsychosocial History:  She grew up with her parents growing up was fine and no physical or sexual trauma. She has worked as a Physiological scientist for many years. She has been disabled because of unable to work multiple conditions leading her to disability. Her husband died 65 years ago on Valentine's Day and he was in vegetative state for 1-1/2 years. She has 2 daughters one daughter died other daughter also does not keep in touch with her. Currently she lives with house mate    Labs:  Recent Results (from the past 2160 hour(s))  TSH     Status: Abnormal   Collection Time: 12/13/14 10:39 AM  Result Value Ref Range   TSH 7.514 (H) 0.350 - 4.500 uIU/mL  T4, Free     Status: None   Collection Time: 12/13/14 10:39 AM  Result Value Ref Range   Free T4 0.86 0.80 - 1.80 ng/dL  Lipid panel     Status: Abnormal   Collection Time: 12/13/14 10:39 AM  Result Value Ref Range    Cholesterol 105 0 - 200 mg/dL    Comment: ATP III Classification:       < 200        mg/dL        Desirable      200 - 239     mg/dL        Borderline High      >= 240        mg/dL        High      Triglycerides 146 <150 mg/dL   HDL 5 (L) >=46 mg/dL    Comment: ** Please note change in reference range(s). **   Total CHOL/HDL Ratio 21.0 Ratio   VLDL 29 0 - 40 mg/dL   LDL Cholesterol 71 0 - 99 mg/dL    Comment:   Total Cholesterol/HDL Ratio:CHD Risk                        Coronary Heart Disease Risk Table                                        Men       Women          1/2 Average Risk              3.4        3.3              Average Risk              5.0        4.4           2X Average Risk              9.6        7.1           3X Average Risk             23.4       11.0 Use the calculated Patient Ratio above and the CHD Risk table  to determine the patient's CHD Risk. ATP III Classification (LDL):       < 100        mg/dL         Optimal  100 - 129     mg/dL         Near or Above Optimal      130 - 159     mg/dL         Borderline High      160 - 189     mg/dL         High       > 190        mg/dL         Very High          Musculoskeletal: Strength & Muscle Tone: within normal limits Gait & Station: broad based Patient leans: front when standing  Mental Status Examination;   Psychiatric Specialty Exam: Physical Exam  HENT:  Head: Normocephalic.  Skin: She is not diaphoretic.    Review of Systems  Constitutional: Negative.   Cardiovascular: Negative for chest pain.  Musculoskeletal: Positive for back pain.  Skin: Negative for rash.  Neurological: Negative for headaches.  Psychiatric/Behavioral: Positive for depression. Negative for suicidal ideas and substance abuse. The patient has insomnia.     Blood pressure 132/82, pulse 73, height 5\' 2"  (1.575 m), weight 194 lb (87.998 kg), SpO2 94 %.Body mass index is 35.47 kg/(m^2).  General Appearance: Casual   Eye Contact::  Fair  Speech:  Slow  Volume:  Decreased  Mood:  Dysphoric  Affect:  Congruent  Thought Process:  Coherent  Orientation:  Full (Time, Place, and Person)  Thought Content:  Rumination  Suicidal Thoughts:  No  Homicidal Thoughts:  No  Memory:  Immediate;   Fair Recent;   Fair  Judgement:  Fair  Insight:  Shallow  Psychomotor Activity:  Decreased  Concentration:  Fair  Recall:  Fair  Akathisia:  Negative  Handed:  Right  AIMS (if indicated):     Assets:  Desire for Improvement Vocational/Educational  Sleep:        Assessment: Axis I: Maj. depressive disorder recurrent moderate. Generalized anxiety disorder. Panic disorder with agoraphobia. 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 Hepatic cirrhosis due to chronic hepatitis C infection     ETOH causative as well  . Hiatal hernia   . Esophageal stricture     esophageal dysmotility and chronic dysphagia as well  . 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 History of alcohol abuse   . Anxiety and depression   . History of hip fracture   . Allergic rhinitis   . HTN (hypertension)   . Blood transfusion   . Cataract     MILD  . GERD (gastroesophageal reflux disease)   . Osteoporosis   . Ulcer     2007  . Arthritis   . Clotting disorder     prolonged clotting time due to liver disease  . Paraesophageal hernia   . Compression fracture 09/21/2013  . Hepatic encephalopathy syndrome     Axis IV: Psychosocial. Multiple medical. Loneliness  Treatment Plan and Summary:  Regarding to her depressive disorder she is taking a mood stabilizer circle but not an antidepressant. I will add Lexapro 5 mg small dose discussed the various side effects.  In regarding to visualize anxiety disorder she is taking Klonopin that she has refills. I cautioned that it caused  forgetfulness and depression in the long run so need to work slowly on lowering  the dose  Denies having side effects In regard to her sleep she can continue Seroquel and also trazodone refills she already has also to provide comprehensive treatment I do recommend psychotherapy to deal with her loneliness and how to deal with her medical conditions.   she'll follow up with her primary care physician regarding maintenance of her hypothyroidism, back conditions and others  Pertinent Labs and Relevant Prior Notes reviewed. Medication Side effects, benefits and risks reviewed/discussed with Patient. Time given for patient to respond and asks questions regarding the Diagnosis and Medications. Safety concerns and to report to ER if suicidal or call 911. Relevant Medications refilled or called in to pharmacy. Discussed weight maintenance and Sleep Hygiene. Follow up with Primary care provider in regards to Medical conditions. Recommend compliance with medications and follow up office appointments. Discussed to avail opportunity to consider or/and continue Individual therapy with Counselor. Greater than 50% of time was spend in counseling and coordination of care with the patient.  Schedule for Follow up visit in 4 weeks or call in earlier as necessary.   Merian Capron, MD 01/03/2015

## 2015-01-07 ENCOUNTER — Ambulatory Visit: Payer: Self-pay | Admitting: Family Medicine

## 2015-01-14 ENCOUNTER — Other Ambulatory Visit: Payer: Self-pay | Admitting: Family Medicine

## 2015-01-14 DIAGNOSIS — S32010D Wedge compression fracture of first lumbar vertebra, subsequent encounter for fracture with routine healing: Secondary | ICD-10-CM | POA: Diagnosis not present

## 2015-01-23 ENCOUNTER — Other Ambulatory Visit: Payer: Self-pay | Admitting: Family Medicine

## 2015-01-23 DIAGNOSIS — G8929 Other chronic pain: Secondary | ICD-10-CM

## 2015-01-23 DIAGNOSIS — M549 Dorsalgia, unspecified: Principal | ICD-10-CM

## 2015-01-29 ENCOUNTER — Encounter: Payer: Self-pay | Admitting: Physical Medicine & Rehabilitation

## 2015-01-30 ENCOUNTER — Ambulatory Visit (INDEPENDENT_AMBULATORY_CARE_PROVIDER_SITE_OTHER): Payer: Medicare Other | Admitting: Psychiatry

## 2015-01-30 ENCOUNTER — Ambulatory Visit (HOSPITAL_COMMUNITY): Payer: Self-pay | Admitting: Psychiatry

## 2015-01-30 ENCOUNTER — Encounter (HOSPITAL_COMMUNITY): Payer: Self-pay | Admitting: Psychiatry

## 2015-01-30 VITALS — BP 126/76 | HR 75 | Ht 62.0 in | Wt 194.0 lb

## 2015-01-30 DIAGNOSIS — M549 Dorsalgia, unspecified: Secondary | ICD-10-CM | POA: Diagnosis not present

## 2015-01-30 DIAGNOSIS — F411 Generalized anxiety disorder: Secondary | ICD-10-CM

## 2015-01-30 DIAGNOSIS — F4001 Agoraphobia with panic disorder: Secondary | ICD-10-CM

## 2015-01-30 DIAGNOSIS — F0630 Mood disorder due to known physiological condition, unspecified: Secondary | ICD-10-CM | POA: Diagnosis not present

## 2015-01-30 DIAGNOSIS — F331 Major depressive disorder, recurrent, moderate: Secondary | ICD-10-CM | POA: Diagnosis not present

## 2015-01-30 DIAGNOSIS — G8929 Other chronic pain: Secondary | ICD-10-CM

## 2015-01-30 MED ORDER — ESCITALOPRAM OXALATE 10 MG PO TABS: 5.0000 mg | ORAL_TABLET | Freq: Every day | ORAL | Status: DC

## 2015-01-30 NOTE — Progress Notes (Signed)
Patient ID: Wendy [name], female   DOB: [birth year], 54 y.o.   MRN: [id number]  [facility] up visit  Wendy [name] [id number] 54 y.o.  01/30/2015 10:23 AM  Chief Complaint:  Depression, follow up  History of Present Illness:   Patient Presents for follow-up and medication management for panic disorder, generalized anxiety disorder. Major depressive disorder. 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  Wendy [name] is 54 years old currently single Caucasian female initially referred by Dr. Sheppard Coil for depression. She has multiple medical conditions including history of broken hip before 4 years ago that exacerbated her symptoms of depression. She is also being managed for anxiety, panic symptoms including agoraphobia. She has history of hepatitis C which is now cured. She has history of fall and multiple medical conditions which she follows up with her primary care physician.  Apparently last visit I sent Lexapro to be filled up but she did not pick it up so she has not taken that. She still endorses down days anxiety. She takes Klonopin for panic attacks. She is taking Seroquel for insomnia and trazodone for insomnia. Seroquel can also be helpful as a mood stabilizer.   Aggravating factors; physical health, broken hip 4 years ago. She feels lonely. Multiple medical issues. Her husband died 56 years ago on Valentine's Day. She is provided with the holidays coming and anniversaries of deaths. Modifying factors; she attends the [employer] sales she tries to go for a walk.  Severity of depression; 4 out of 10. 10 being no depression Duration; more than 10:15 years. Worse in the last 4 years Medical complexity; reviewed records. She does have hypothyroidism. History of hepatitis C. Hip surgeries. All these conditions can exacerbate her depression.  There is no associated symptoms of psychosis, paranoia. Does not  endorse suicidal or homicidal thoughts. There is no history of physical or sexual trauma.    Medical History; Past Medical History  Diagnosis Date  . Hepatic cirrhosis due to chronic hepatitis C infection     ETOH causative as well  . Hiatal hernia   . Esophageal stricture     esophageal dysmotility and chronic dysphagia as well  . 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 History of alcohol abuse   . Anxiety and depression   . History of hip fracture   . Allergic rhinitis   . HTN (hypertension)   . Blood transfusion   . Cataract     MILD  . GERD (gastroesophageal reflux disease)   . Osteoporosis   . Ulcer     2007  . Arthritis   . Clotting disorder     prolonged clotting time due to liver disease  . Paraesophageal hernia   . Compression fracture 09/21/2013  . Hepatic encephalopathy syndrome     Allergies: Allergies  Allergen Reactions  . Codeine Phosphate Itching  . Codeine Rash    Medications: Outpatient Encounter Prescriptions as of 01/30/2015  Medication Sig  . alendronate (FOSAMAX) 70 MG tablet Take 70 mg by mouth.  . bisacodyl ([employer] GENTLE LAXATIVE) 5 MG EC tablet TAKE 1 TABLET (5 MG TOTAL) BY MOUTH DAILY.  . calcitonin, salmon, (MIACALCIN/FORTICAL) 200 UNIT/ACT nasal spray Place 1 spray into alternate nostrils daily.  . carvedilol (COREG) 12.5 MG tablet Take 1 tablet (12.5 mg total) by mouth 2 (two) times daily with a meal.  . Cholecalciferol (VITAMIN D3) 2000 UNITS capsule Take 2,000 Units by mouth daily.  Marland Kitchen  clonazePAM (KLONOPIN) 1 MG tablet Take 1 tablet (1 mg total) by mouth 2 (two) times daily as needed for anxiety.  . cyclobenzaprine (FLEXERIL) 10 MG tablet TAKE 1/2 TABLET 3 TIMES A DAY AS NEEDED FOR MUSCLE SPASM  . cycloSPORINE (RESTASIS) 0.05 % ophthalmic emulsion Place 1 drop into both eyes 2 (two) times daily as needed (For dry eyes.).  Marland Kitchen docusate sodium (COLACE) 100 MG capsule TAKE ONE CAPSULE BY MOUTH TWICE A DAY  . furosemide  (LASIX) 40 MG tablet Take 0.5 tablets (20 mg total) by mouth every morning. (Patient taking differently: Take 20 mg by mouth every other day. )  . glycerin adult (GLYCERIN ADULT) 2 G SUPP Place 1 suppository rectally daily as needed for moderate constipation.  . hydrOXYzine (ATARAX/VISTARIL) 25 MG tablet TAKE 1/2 TABLET BY MOUTH EVERY 6 HOURS AS NEEDED FOR ITCHING  . lactulose (CHRONULAC) 10 GM/15ML solution Take 45 mLs (30 g total) by mouth 4 (four) times daily as needed (for constipation).  . Ledipasvir-Sofosbuvir (HARVONI) 90-400 MG TABS Take by mouth.  . levothyroxine (SYNTHROID, LEVOTHROID) 50 MCG tablet Take 1 tablet (50 mcg total) by mouth daily before breakfast.  . Multiple Vitamins-Minerals (CENTRUM SILVER ULTRA WOMENS PO) Take by mouth.  . Olopatadine HCl 0.2 % SOLN Place 1 drop into both eyes every morning.   Marland Kitchen omeprazole (PRILOSEC) 20 MG capsule   . ondansetron (ZOFRAN ODT) 4 MG disintegrating tablet Take 1 tablet (4 mg total) by mouth every 8 (eight) hours as needed for nausea or vomiting.  . polyethylene glycol (MIRALAX) packet Take 17 g by mouth 2 (two) times daily.  . QUEtiapine (SEROQUEL) 200 MG tablet TAKE 1 TABLET BY MOUTH AT BEDTIME  . ribavirin (COPEGUS) 200 MG tablet   . rifaximin (XIFAXAN) 550 MG TABS tablet Take 550 mg by mouth 2 (two) times daily.  Marland Kitchen spironolactone (ALDACTONE) 100 MG tablet TAKE 1 TABLET EVERY DAY  . traMADol (ULTRAM) 50 MG tablet Take 1 tablet (50 mg total) by mouth every 8 (eight) hours as needed (For pain.).  Marland Kitchen traZODone (DESYREL) 50 MG tablet Take 0.5-1 tablets (25-50 mg total) by mouth at bedtime as needed for sleep.  Marland Kitchen amoxicillin (AMOXIL) 500 MG capsule TAKE 4 CAPSULES BY MOUTH 30-60 MINUTES PRIOR TO PROCEDURE (Patient not taking: Reported on 12/13/2014)  . escitalopram (LEXAPRO) 10 MG tablet Take 0.5 tablets (5 mg total) by mouth daily.  . [DISCONTINUED] escitalopram (LEXAPRO) 10 MG tablet Take 0.5 tablets (5 mg total) by mouth daily.   No  facility-administered encounter medications on file as of 01/30/2015.    Family History; Family History  Problem Relation Age of Onset  . Heart disease Mother   . Anxiety disorder Mother   . Colon cancer Neg Hx   . Other Daughter     chromosome abnormalit  . Other Daughter     micropthalmia      Biopsychosocial History:  She grew up with her parents growing up was fine and no physical or sexual trauma. She has worked as a Physiological scientist for many years. She has been disabled because of unable to work multiple conditions leading her to disability. Her husband died 33 years ago on Valentine's Day and he was in vegetative state for 1-1/2 years. She has 2 daughters one daughter died other daughter also does not keep in touch with her. Currently she lives with house mate    Labs:  Recent Results (from the past 2160 hour(s))  TSH  Status: Abnormal   Collection Time: 12/13/14 10:39 AM  Result Value Ref Range   TSH 7.514 (H) 0.350 - 4.500 uIU/mL  T4, Free     Status: None   Collection Time: 12/13/14 10:39 AM  Result Value Ref Range   Free T4 0.86 0.80 - 1.80 ng/dL  Lipid panel     Status: Abnormal   Collection Time: 12/13/14 10:39 AM  Result Value Ref Range   Cholesterol 105 0 - 200 mg/dL    Comment: ATP III Classification:       < 200        mg/dL        Desirable      200 - 239     mg/dL        Borderline High      >= 240        mg/dL        High      Triglycerides 146 <150 mg/dL   HDL 5 (L) >=46 mg/dL    Comment: ** Please note change in reference range(s). **   Total CHOL/HDL Ratio 21.0 Ratio   VLDL 29 0 - 40 mg/dL   LDL Cholesterol 71 0 - 99 mg/dL    Comment:   Total Cholesterol/HDL Ratio:CHD Risk                        Coronary Heart Disease Risk Table                                        Men       Women          1/2 Average Risk              3.4        3.3              Average Risk              5.0        4.4           2X Average Risk               9.6        7.1           3X Average Risk             23.4       11.0 Use the calculated Patient Ratio above and the CHD Risk table  to determine the patient's CHD Risk. ATP III Classification (LDL):       < 100        mg/dL         Optimal      100 - 129     mg/dL         Near or Above Optimal      130 - 159     mg/dL         Borderline High      160 - 189     mg/dL         High       > 190        mg/dL         Very High          Musculoskeletal: Strength & Muscle Tone: within normal limits Gait & Station: broad based  Patient leans: front when standing  Mental Status Examination;   Psychiatric Specialty Exam: Physical Exam  HENT:  Head: Normocephalic.  Skin: She is not diaphoretic.    Review of Systems  Constitutional: Negative for fever.  Respiratory: Negative for cough.   Musculoskeletal: Positive for back pain.  Skin: Negative for rash.  Neurological: Negative for headaches.  Psychiatric/Behavioral: Positive for depression. Negative for suicidal ideas and substance abuse. The patient has insomnia.     Blood pressure 126/76, pulse 75, height 5\' 2"  (1.575 m), weight 194 lb (87.998 kg), SpO2 92 %.Body mass index is 35.47 kg/(m^2).  General Appearance: Casual  Eye Contact::  Fair  Speech:  Slow  Volume:  Decreased  Mood:  Dysphoric but not hopeless  Affect:  Congruent  Thought Process:  Coherent  Orientation:  Full (Time, Place, and Person)  Thought Content:  Rumination  Suicidal Thoughts:  No  Homicidal Thoughts:  No  Memory:  Immediate;   Fair Recent;   Fair  Judgement:  Fair  Insight:  Shallow  Psychomotor Activity:  Decreased  Concentration:  Fair  Recall:  Fair  Akathisia:  Negative  Handed:  Right  AIMS (if indicated):     Assets:  Desire for Improvement Vocational/Educational  Sleep:        Assessment: Axis I: Maj. depressive disorder recurrent moderate. Generalized anxiety disorder. Panic disorder with agoraphobia. Mood disorder secondary to  general medical condition that is including back condition,  hypothyroidism  Axis II: Deferred  Axis III:  Past Medical History  Diagnosis Date  . Hepatic cirrhosis due to chronic hepatitis C infection     ETOH causative as well  . Hiatal hernia   . Esophageal stricture     esophageal dysmotility and chronic dysphagia as well  . 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 History of alcohol abuse   . Anxiety and depression   . History of hip fracture   . Allergic rhinitis   . HTN (hypertension)   . Blood transfusion   . Cataract     MILD  . GERD (gastroesophageal reflux disease)   . Osteoporosis   . Ulcer     2007  . Arthritis   . Clotting disorder     prolonged clotting time due to liver disease  . Paraesophageal hernia   . Compression fracture 09/21/2013  . Hepatic encephalopathy syndrome     Axis IV: Psychosocial. Multiple medical. Loneliness  Treatment Plan and Summary:  Will print out lexapro prescription last visit she did not pick pu. 5mg   Qd for depression and anxiety She is on seroquel, trazadone and klonopine from primary care for mood stabilization, sleep and panic disorder. Concern remains of her back which limit and frustrate her. She will talk about relavant meds to her primary care.   Pertinent Labs and Relevant Prior Notes reviewed. Medication Side effects, benefits and risks reviewed/discussed with Patient. Time given for patient to respond and asks questions regarding the Diagnosis and Medications. Safety concerns and to report to ER if suicidal or call 911. Relevant Medications refilled or called in to pharmacy. Discussed weight maintenance and Sleep Hygiene. Follow up with Primary care provider in regards to Medical conditions. Recommend compliance with medications and follow up office appointments. Discussed to avail opportunity to consider or/and continue Individual therapy with Counselor. Greater than 50% of time was spend in  counseling and coordination of care with the patient.  Schedule for Follow up visit in 4 weeks or  call in earlier as necessary.   Merian Capron, MD 01/30/2015

## 2015-01-31 ENCOUNTER — Ambulatory Visit (INDEPENDENT_AMBULATORY_CARE_PROVIDER_SITE_OTHER): Payer: Medicare Other | Admitting: Family Medicine

## 2015-01-31 ENCOUNTER — Encounter: Payer: Self-pay | Admitting: Family Medicine

## 2015-01-31 VITALS — BP 126/76 | HR 76 | Temp 98.1°F | Ht 62.0 in | Wt 196.0 lb

## 2015-01-31 DIAGNOSIS — F4001 Agoraphobia with panic disorder: Secondary | ICD-10-CM

## 2015-01-31 DIAGNOSIS — T148 Other injury of unspecified body region: Secondary | ICD-10-CM | POA: Diagnosis not present

## 2015-01-31 DIAGNOSIS — F331 Major depressive disorder, recurrent, moderate: Secondary | ICD-10-CM

## 2015-01-31 DIAGNOSIS — G8929 Other chronic pain: Secondary | ICD-10-CM

## 2015-01-31 DIAGNOSIS — M549 Dorsalgia, unspecified: Secondary | ICD-10-CM | POA: Diagnosis not present

## 2015-01-31 DIAGNOSIS — K7030 Alcoholic cirrhosis of liver without ascites: Secondary | ICD-10-CM

## 2015-01-31 DIAGNOSIS — R296 Repeated falls: Secondary | ICD-10-CM | POA: Diagnosis not present

## 2015-01-31 DIAGNOSIS — IMO0002 Reserved for concepts with insufficient information to code with codable children: Secondary | ICD-10-CM

## 2015-01-31 MED ORDER — TRAMADOL HCL 50 MG PO TABS: 50.0000 mg | ORAL_TABLET | Freq: Three times a day (TID) | ORAL | Status: DC | PRN

## 2015-01-31 MED ORDER — HYDROXYZINE HCL 25 MG PO TABS: 25.0000 mg | ORAL_TABLET | Freq: Every evening | ORAL | Status: DC | PRN

## 2015-01-31 NOTE — Progress Notes (Signed)
As patient was leaving office she fell against the wall. I stopped and asked her if she was ok and she began to sway as if she was going to fall. I called for a wheelchair and patient was seated in chair and wheeled to an exam room. Patient stated she felt as thought she was going to pass out. Vitals were normal at 133/78 and pulse of 80. Asked that patient wait in room so we could notify provider of what happened. Patient then asked to go to the bathroom. Patient was wheeled to the bathroom and left with strict instructions to pull cord if she felt faint again. After a few minutes patient comes out of bathroom and stated she felt fine and that she wasn't going to wait to see provider again, then abruptly left clinic.

## 2015-01-31 NOTE — Patient Instructions (Addendum)
Dear Wendy [name], Thank you for coming in to clinic today.  1. Medication Changes: - Stop taking - Flexeril - Reduce Hydroxyzine (Atarax) to ONE (1) tablet at night for itching (NOT 2) - Take Lasix half to whole tab - in MORNING (not night) - Increase Tramadol - 1 to 2 tablets up to 3 times a day (every 8 hours)  - Pick up your new rx (Lexapro, escitalopram) given to you yesterday  2. We will send referral for you to return to Neurorehab for your balance. You will get called about this appointment  3. Follow-up with Behavioral Health  Referral to Physical Medicine and Rehabilitation - therapy, chronic management of back pain, goal to improve function, consider pain management  Please schedule a follow-up appointment with Dr. Parks Ranger in 3 months for follow-up Back Pain  If you have any other questions or concerns, please feel free to call the clinic to contact me. You may also schedule an earlier appointment if necessary.  However, if your symptoms get significantly worse, please go to the Emergency Department to seek immediate medical attention.  [name], [place]

## 2015-01-31 NOTE — Progress Notes (Signed)
   Subjective:    Patient ID: Wendy [name], female    DOB: 08-05-1961, 54 y.o.   MRN: [id number]  HPI  BACK PAIN, CHRONIC: - Chronic hx known lumbar DJD, h/o osteopenia with compression fractures T12, L1 s/p kyphoplasty 08/2013 - Followed by Dr. Melina Schools ([place]) - last seen 11/2014 without any further recommendations. No surgery. - Stable chronic bilateral lower back pain today without any worsening or improvement. Infrequent radiating pain in legs. - Currently taking Flexeril half to whole tab BID to TID without relief. Tramadol 50mg  1-2x daily with some mild relief. Using heating pad - Previously on PT with improvement - Denies any numbness or weakness, bladder or bowel incontinence, saddle anesthesia  MDD, recurrent / GAD with Agoraphobia: - Chronic (>10 years) history of mood disorder and anxiety with associated agoraphobia - Recently re-established with Psychiatry at [facility] (Dr. De Nurse) seen on 5/12 and 6/8 - Never started Lexapro 5mg  (half of 10mg  tab) daily, rx re-given to pt yesterday 6/8 - Currently admits to moderate depression, seems controlled without worsening. Complicated by anxiety and other medical illnesses. - Associated insomnia, taking seroquel (for both mood and insomnia), recently started on trazodone taking 50mg  (whole tab) qhs with good results - Denies depressed mood, SI/HI  RECURRENT FALLS / DIZZINESS: - Chronic complaint with gradual worsening falls over past 1 year, associated with episodes dizziness - Followed by [facility] (Dr. Lavell Anchors). Last MRI (08/29/14) demonstrated stable findings since 2006, with known prior injury and encephalomalacia with gliosis. - Has not had subsequent neuro-rehab PT since initial 2 sessions, requesting to return to this. Missed apt due to transportation - Admits recent falls with Right lower ext abrasion, did not hit head, denies any LOC - Concerned with polypharmacy, taking Hydroxyzine x 2  tabs qhs for itching and sleep, also flexeril can be sedating if not helping. - Denies vertigo, nausea, loss of vision, HA, active CP or SOB, palpitations  CHRONIC CIRRHOSIS, LOWER EXT EDEMA: - H/o Hep C, s/p cured after completed Harvoni course. Followed by [facility] to increased lower ext bilateral edema - Taking Lasix 40mg  whole tab qhs, but complains of nocturia. However, rx reads half tab daily - Denies abdominal pain, distention, fevers/chills  I have reviewed and updated the following as appropriate: allergies and current medications  Social Hx: - Former smoker - Difficulty remembering / organizing all of her medications. She has friends who help organize her weekly pills. Did not bring medicines in today.  Review of Systems  See above HPI    Objective:   Physical Exam  BP 126/76 mmHg  Pulse 76  Temp(Src) 98.1 F (36.7 C) (Oral)  Ht 5\' 2"  (1.575 m)  Wt 196 lb (88.905 kg)  BMI 35.84 kg/m2  * Reviewed above nurses note regarding episode of pt almost losing balance ambulating in hallway. Normal vitals taken. Patient declined to return for re-evaluation. *  Gen - chronically ill-appearing, obese, NAD HEENT - MMM Neck - supple, stable, non-tender MSK - Back: mild +TTP over bilateral Lumbar paraspinal muscles with hypertonicity, reduced ROM flexion / ext Abd - obese, NTND, soft Ext - no edema, non-tender, peripheral pulses intact +2 b/l  Skin - warm, dry, no rashes Neuro - awake, alert, oriented, no tremors, muscle str 5/5 grip and ankle dorsiflexion, slow cautious gait     Assessment & Plan:   See specific A&P problem list for details.

## 2015-02-01 NOTE — Assessment & Plan Note (Addendum)
Stable mood, complicated by GAD, agoraphobia, with secondary insomnia - Now established with [facility] - No SI/HI  Plan: 1. Advised to start Lexapro 5mg  (half 10mg  tabs) given at last [facility] apt 01/30/15, had not started previously 2. Continue Seroquel 200mg  qhs, Trazodone 50mg  qhs for insomnia 3. Discontinue Hydroxyzine PRN 4. Follow-up with [facility] outpt psych, will benefit from counseling/therapy

## 2015-02-01 NOTE — Assessment & Plan Note (Signed)
Stable chronic LBP, with h/o lumbar DJD and compression fractures s/p kyphoplasty T12-L1 in Jan 2015. Some radiating symptoms without worsening, ?radicular vs facetogenic - Last imaging T-L Spine X-rays (worsening L5-S1 facet arthropathy, otherwise no fractures, stable T12-L1 kyphoplasty) - Followed by Ortho (Dr. Rolena Infante), last seen 11/2014  Plan: 1. Encouraged again to increase Tramadol to 50-100mg  TID PRN (inc to #120, 2 refills, printed) - did not follow instructions last visit to increase, declines to take Tylenol / NSAIDs however don't see contraindication for NSAIDs 2. Discontinue Flexeril - not helping, concern polypharmacy sedation. Consider switch to Baclofen in future if needed 3. Starting Lexapro from psych - hold on considering cymbalta. Consider gabapentin. Previously considered opiates but major concern sedation / fall risk (pt previously on short courses with relief, reviewed opiate risk tool score 8 (lowest end of high risk, Fam Hx alcohol (1+3), Personal hx alcohol (3), Psych (depression 1). 4. Follow-up with PMR to establish / functional eval - apt 03/14/2015 5. RTC 3 months, consider baclofen

## 2015-02-01 NOTE — Assessment & Plan Note (Signed)
Recent falls without head injury or LOC. Seems stable / unchanged from previous, likely multifactorial with dizziness, possible BPPV, and chronic debilitation with chronic back pain  Plan: 1. Ordered repeat course of neuro-rehab PT for BPPV treatment, gait training and str 2. Continue to treat underlying hypothyroid, mood, anxiety 3. Emphasized importance of assisted devices

## 2015-02-01 NOTE — Assessment & Plan Note (Signed)
Discontinue Calcitonin, no longer acute frx

## 2015-02-01 NOTE — Assessment & Plan Note (Signed)
Stable, controlled on Klonopin, worsened by limited mobility pt with even more difficulties in public  Plan: 1. Refilled Klonopin. 2. Addition of Lexapro should help anxiety, maybe can reduce Klonopin dose in future, concern for sedation / polypharmacy

## 2015-02-01 NOTE — Assessment & Plan Note (Signed)
Currently stable. Mild inc lower ext edema seems consistent with previous.  Plan: 1. Continue current Lasix and Spironolactone diuresis 2. Change lasix dosing to 20-40mg  daily (in AM) not qhs, to reduce nocturia

## 2015-02-03 ENCOUNTER — Other Ambulatory Visit: Payer: Self-pay | Admitting: Family Medicine

## 2015-02-03 DIAGNOSIS — K5900 Constipation, unspecified: Secondary | ICD-10-CM

## 2015-02-06 ENCOUNTER — Other Ambulatory Visit: Payer: Self-pay | Admitting: Family Medicine

## 2015-02-06 DIAGNOSIS — K5900 Constipation, unspecified: Secondary | ICD-10-CM

## 2015-02-25 ENCOUNTER — Other Ambulatory Visit: Payer: Self-pay | Admitting: Family Medicine

## 2015-02-25 DIAGNOSIS — E038 Other specified hypothyroidism: Secondary | ICD-10-CM

## 2015-02-26 MED ORDER — LEVOTHYROXINE SODIUM 50 MCG PO TABS: ORAL_TABLET | ORAL | Status: DC

## 2015-03-05 ENCOUNTER — Telehealth: Payer: Self-pay | Admitting: Family Medicine

## 2015-03-05 NOTE — Telephone Encounter (Signed)
Spoke with patient she states her symptoms have been getting worse since last visit. She has trouble remembering things and with word finding. She states she also continues with dizzy spells and has had multiple falls since last visit (no injuries). Patient seemed to be slurring her words on the phone. Offered appointment but patient would like to discuss this with PCP first. Will forward to MD.

## 2015-03-05 NOTE — Telephone Encounter (Signed)
Pt called and asked to speak to Dr. Parks Ranger about a issue, She didn't want to tell me when I asked. jw

## 2015-03-06 NOTE — Telephone Encounter (Signed)
Called patient back, spoke to patient Wendy [name]. She reports current symptoms similar to previous known chronic pain with LBP and generalized weakness with recurrent falls, continues with some slurred speech and memory loss, consistent with dementia as previously discussed (known history of traumatic brain injury). She states current symptoms gradual worsening but no acute change. No new symptoms. Falls but without significant head injury. Denies focal weakness.  I advised both patient and her friend Alexis Frock (who takes her regularly to office visits) that she has an upcoming new pt apt with Dr. Alysia Penna on 7/21 at 12:30pm for PM&R rehab physician to evaluate her chronic worsening pain and recurrent falls, we've tried to get this appointment for a while now and it is very important that she follows up to get established next week.  Given address and phone # below and advised them to call to confirm apt and get directions. Address: [address], [place], [place] [address] Phone: [phone]  Additionally, followed by Dr. Jaynee Eagles ([facility]), last visit 06/2014 had MRI in 08/2014 essentially stable with known TBI and no significant change. I advised patient to call Dr. Cathren Laine office and schedule a follow-up given worsening memory loss and cognitive symptoms. I will attempt to call and discuss case with Dr. Jaynee Eagles as well within the next week.  Additionally, return criteria given to patient regarding acute worsening, head injury, new focal weakness, or any other emergent concerns may need to go to ED for further eval.  [name], [place], PGY-3

## 2015-03-08 ENCOUNTER — Ambulatory Visit (HOSPITAL_COMMUNITY): Payer: Self-pay | Admitting: Psychiatry

## 2015-03-10 ENCOUNTER — Telehealth: Payer: Self-pay | Admitting: Neurology

## 2015-03-10 NOTE — Telephone Encounter (Signed)
Dr Parks Ranger, Of course, we will try to contact the patient and get a follow up scheduled. Thank you so much for the concern and well being of your patients.  Wendy [name] - can you try to reach patient please and schedule a 30 minute follow up? I know last time it was difficult to reach her, the phone number listed was not working and we had to call her friend who then had to contact patient to  call us back. Hopefully patient's phone number has been updated since then. Otherwise we can try the friend again.  Let's try and get her in here for a f/u. And if she can come before lunch or at the end of the day, that gives me more time with her.   Thanks.

## 2015-03-11 ENCOUNTER — Other Ambulatory Visit: Payer: Self-pay | Admitting: *Deleted

## 2015-03-11 DIAGNOSIS — F4001 Agoraphobia with panic disorder: Secondary | ICD-10-CM

## 2015-03-11 MED ORDER — CLONAZEPAM 1 MG PO TABS: 1.0000 mg | ORAL_TABLET | Freq: Two times a day (BID) | ORAL | Status: DC | PRN

## 2015-03-11 NOTE — Telephone Encounter (Signed)
Spoke to patient - follow appointment has been scheduled.

## 2015-03-11 NOTE — Telephone Encounter (Signed)
Left a message on her voicemail. Called her friend on [employer], Nevin Bloodgood, who will also try to contact her.  Need to schedule a 30 minute appt w/ Dr. Jaynee Eagles.

## 2015-03-11 NOTE — Telephone Encounter (Signed)
Phoned in refill to pharmacy, Clonazepam 1mg  PO BID PRN anxiety, #60, 2 refills.  [name], [place], PGY-3

## 2015-03-13 ENCOUNTER — Ambulatory Visit: Payer: Self-pay | Admitting: Neurology

## 2015-03-14 ENCOUNTER — Ambulatory Visit (HOSPITAL_BASED_OUTPATIENT_CLINIC_OR_DEPARTMENT_OTHER): Payer: Medicare Other | Admitting: Physical Medicine & Rehabilitation

## 2015-03-14 ENCOUNTER — Encounter: Payer: Self-pay | Admitting: Physical Medicine & Rehabilitation

## 2015-03-14 ENCOUNTER — Encounter: Payer: Medicare Other | Attending: Physical Medicine & Rehabilitation

## 2015-03-14 ENCOUNTER — Other Ambulatory Visit: Payer: Self-pay | Admitting: Family Medicine

## 2015-03-14 VITALS — BP 108/73 | HR 77 | Resp 14

## 2015-03-14 DIAGNOSIS — Z87820 Personal history of traumatic brain injury: Secondary | ICD-10-CM | POA: Diagnosis not present

## 2015-03-14 DIAGNOSIS — M47816 Spondylosis without myelopathy or radiculopathy, lumbar region: Secondary | ICD-10-CM

## 2015-03-14 DIAGNOSIS — G8929 Other chronic pain: Secondary | ICD-10-CM | POA: Diagnosis not present

## 2015-03-14 DIAGNOSIS — M545 Low back pain, unspecified: Secondary | ICD-10-CM

## 2015-03-14 DIAGNOSIS — K219 Gastro-esophageal reflux disease without esophagitis: Secondary | ICD-10-CM | POA: Insufficient documentation

## 2015-03-14 DIAGNOSIS — G9389 Other specified disorders of brain: Secondary | ICD-10-CM | POA: Diagnosis not present

## 2015-03-14 DIAGNOSIS — I10 Essential (primary) hypertension: Secondary | ICD-10-CM | POA: Insufficient documentation

## 2015-03-14 NOTE — Progress Notes (Signed)
Subjective:    Patient ID: Wendy [name], female    DOB: 09-Jan-1961, 54 y.o.   MRN: [id number]  HPI 54 year old female with chief complaint of chronic low back pain.  Previously a patient here but has not been seen for 3 years. History of lumbar spondylosis with primarily axial back pain. She had a greater than 50% relief with bilateral L3 L4 L5 medial branch blocks. She was to follow-up with a second set of injections and then evaluate for lumbar radiofrequency but she never returned to clinic. Patient does not recall exactly why she didn't come back. She has been seeing a Licensed conveyancer, Dr. Rolena Infante who Decided she did not need any surgery. She also was treated in the past at this clinic for trochanteric bursitis.  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 She underwent recent neurological evaluation including EEG which was negative. Patient's residual problems include decreased memory and concentration.  Patient underwent outpatient therapy for dizziness, BPPV but this was unclear whether this was helpful.  Patient has had falls at home but the last one was in the winter. She had gone to physical therapy and they tried an assisted device however she states it did not help from much. In fact it was a bit confusing for her to use.  Occasionally has some pain shooting down the legs this is nothing that is consistent. No precipitating factors.  Dresses and bathes herself although she has some difficulty to doing so.  She lives with a roommate, lost her husband several years ago.  Pain Inventory Average Pain 7 Pain Right Now 10 My pain is stabbing, tingling and aching  In the last 24 hours, has pain interfered with the following? General activity 7 Relation with others 7 Enjoyment of life 9 What TIME of day is your pain at its worst? evening Sleep (in general) Poor  Pain is worse with: walking and standing Pain improves with:  medication Relief from Meds: 7  Mobility walk with assistance  Function not employed: date last employed . disabled: date disabled . I need assistance with the following:  dressing, bathing and household duties  Neuro/Psych bladder control problems bowel control problems weakness numbness tingling trouble walking spasms dizziness confusion depression anxiety loss of taste or smell  Prior Studies new visit Lumbar spine x-rays February 2016 reviewed including the films. Evidence of lumbar facet arthropathy L5-S1 greater than L4-28 August 2014 MRI of the brain ordered by Dr. Lavell Anchors from neurology Abnormal MRI brain (with and without) demonstrating: 1. Right frontal cystic encephalomalacia with gliosis. Moderate periventricular and subcortical non-specific gliosis. Left parietal / occipital craniotomy and metallic artifact from prior surgery.  2. No acute findings. 3. No significant change from MRI on 03/25/05. Physicians involved in your care new visit   Family History  Problem Relation Age of Onset  . Heart disease Mother   . Anxiety disorder Mother   . Colon cancer Neg Hx   . Other Daughter     chromosome abnormalit  . Other Daughter     micropthalmia   History   Social History  . Marital Status: Widowed    Spouse Name: N/A  . Number of Children: 2  . Years of Education: 12+   Occupational History  . Disabled    Social History Main Topics  . Smoking status: Former Smoker -- 0.25 packs/day for 10 years    Types: Cigarettes    Quit date: 12/23/2008  .  Smokeless tobacco: Never Used  . Alcohol Use: No     Comment: goes to AA  . Drug Use: No  . Sexual Activity: No   Other Topics Concern  . None   Social History Narrative   Just before her husband died, she had a miscarriage and started to drink beer heavily.  She quit secondary to her liver disease and has been quit for several years.  She denies the use of drugs - prescription or otherwise.      Past Surgical History  Procedure Laterality Date  . Orif hip fracture  2010    right x2  . Upper gastrointestinal endoscopy  08/05/2007    esophageal ring, hiatal hernia, portal gastropathy  . Splenectomy      age 53  . Burr hole for subdural hematoma  2004  . Upper gastrointestinal endoscopy  10/14/2011  . Colonoscopy  08/2012    moderatel left colon tics  . Kyphoplasty Bilateral 09/21/2013    Procedure: T12 - L1 KYPHOPLASTY;  Surgeon: Melina Schools, MD;  Location: [facility];  Service: Orthopedics;  Laterality: Bilateral;  . Esophagogastroduodenoscopy (egd) with propofol N/A 01/26/2014    Procedure: ESOPHAGOGASTRODUODENOSCOPY (EGD) WITH PROPOFOL;  Surgeon: Lafayette Dragon, MD;  Location: [facility] END[facility]COPY;  Service: Endoscopy;  Laterality: N/A;   Past Medical History  Diagnosis Date  . Hepatic cirrhosis due to chronic hepatitis C infection     ETOH causative as well  . Hiatal hernia   . Esophageal stricture     esophageal dysmotility and chronic dysphagia as well  . 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 History of alcohol abuse   . Anxiety and depression   . History of hip fracture   . Allergic rhinitis   . HTN (hypertension)   . Blood transfusion   . Cataract     MILD  . GERD (gastroesophageal reflux disease)   . Osteoporosis   . Ulcer     2007  . Arthritis   . Clotting disorder     prolonged clotting time due to liver disease  . Paraesophageal hernia   . Compression fracture 09/21/2013  . Hepatic encephalopathy syndrome    BP 108/73 mmHg  Pulse 77  Resp 14  SpO2 95%  Opioid Risk Score:   Fall Risk Score:  `1  Depression screen PHQ 2/9  Depression screen [facility] 2/9 03/14/2015 01/31/2015 12/13/2014 09/04/2014 01/18/2014 01/11/2014 03/16/2013  Decreased Interest 3 1 3  0 0 1 3  Down, Depressed, Hopeless 2 1 2 3  0 1 3  PHQ - 2 Score 5 2 5 3  0 2 6  Altered sleeping 3 1 1  - - - 3  Tired, decreased energy 3 1 2  - - - 3  Change in appetite 3 1 0 - - - 1   Feeling bad or failure about yourself  2 1 1  - - - 3  Trouble concentrating 3 1 3  - - - 3  Moving slowly or fidgety/restless 2 1 0 - - - 3  Suicidal thoughts 0 0 0 - - - 0  PHQ-9 Score 21 8 12  - - - 22  Difficult doing work/chores - - Somewhat difficult - - - -     Review of Systems  Constitutional:       Fever/chills Weight gain Loss of taste/smell  Gastrointestinal: Positive for nausea and constipation.  Musculoskeletal: Positive for gait problem.  Neurological: Positive for dizziness, weakness and numbness.  Tingling Spasms   Psychiatric/Behavioral: Positive for confusion and dysphoric mood. The patient is nervous/anxious.   All other systems reviewed and are negative.      Objective:   Physical Exam  Constitutional: She appears well-developed.  Morbidly obese  HENT:  Head: Normocephalic and atraumatic.  Eyes: Conjunctivae are normal. Pupils are equal, round, and reactive to light.  Neck: Normal range of motion.  Neurological: She is alert. She has normal strength.  Reflex Scores:      Patellar reflexes are 1+ on the right side and 1+ on the left side.      Achilles reflexes are 1+ on the right side and 1+ on the left side. Motor strength is 5/5 bilateral hip flexor and extensor ankle dorsiflexor  Sensation is normal bilateral lower extremity light touch and pinprick  Psychiatric: Thought content normal. Her affect is blunt and inappropriate. Her speech is delayed and tangential. She is slowed. She expresses inappropriate judgment. She exhibits abnormal recent memory and abnormal remote memory.  Patient inappropriate regarding her judgment as it pertains to her balance She is inattentive.  Nursing note and vitals reviewed.  Mini mental status examination 17/30       Assessment & Plan:  1. Chronic low back pain imaging studies consistent with lumbar spondylosis, She has no significant radicular symptoms or physical exam findings. Patient did benefit from  prior lumbar medial branch blocks L3-L4 as well as L5 dorsal ramus injection approximate 3 years ago. She was scheduled for a repeat injection but was lost to follow-up. Recommend repeat injection and if greater than 50% relief at least on a temporary basis then scheduled for radiofrequency neurotomy  Given her history of traumatic brain injury which is significant and has significant MRI findings as well as signs of a severe cognitive deficit I would avoid narcotic analgesics. In addition patient has fall risk which is related mainly to her traumatic brain injury, opioids would increase her risk.  Patient may require additional physical therapy  Discussed plan with patient and caregiver  2. Traumatic brain injury recent EEG negative, recent MRI demonstrating  Encephalomalacia which is unchanged from 2006 MRI. Patient very poor historian she reports traumatic brain injury 5-6 years ago however clearly it was greater than 10 years ago. See comments above, Neurology follow-up

## 2015-03-14 NOTE — Progress Notes (Signed)
Past hx of ETOH and depression which brings ORT score to 7

## 2015-03-14 NOTE — Patient Instructions (Signed)
I suspect her back pain is coming from lumbar spondylosis  The hip pain appears to be trochanteric bursitis, we will inject that area today

## 2015-03-15 ENCOUNTER — Ambulatory Visit: Payer: Self-pay | Admitting: Family Medicine

## 2015-03-19 ENCOUNTER — Ambulatory Visit (HOSPITAL_COMMUNITY): Payer: Medicare Other | Admitting: Psychiatry

## 2015-03-21 ENCOUNTER — Other Ambulatory Visit: Payer: Self-pay | Admitting: Family Medicine

## 2015-03-21 ENCOUNTER — Ambulatory Visit (INDEPENDENT_AMBULATORY_CARE_PROVIDER_SITE_OTHER): Payer: Medicare Other | Admitting: Neurology

## 2015-03-21 ENCOUNTER — Encounter: Payer: Self-pay | Admitting: Neurology

## 2015-03-21 VITALS — BP 142/87 | HR 75 | Temp 97.8°F | Ht 62.0 in | Wt 205.2 lb

## 2015-03-21 DIAGNOSIS — R51 Headache: Secondary | ICD-10-CM

## 2015-03-21 DIAGNOSIS — R519 Headache, unspecified: Secondary | ICD-10-CM

## 2015-03-21 DIAGNOSIS — E669 Obesity, unspecified: Secondary | ICD-10-CM

## 2015-03-21 DIAGNOSIS — G4719 Other hypersomnia: Secondary | ICD-10-CM | POA: Diagnosis not present

## 2015-03-21 NOTE — Patient Instructions (Signed)
Overall you are doing well but I do want to suggest a few things today:   Remember to drink plenty of fluid, eat healthy meals and do not skip any meals. Try to eat protein with a every meal and eat a healthy snack such as fruit or nuts in between meals. Try to keep a regular sleep-wake schedule and try to exercise daily, particularly in the form of walking, 20-30 minutes a day, if you can.   As far as your medications are concerned, I would like to suggest: continue current meds  As far as diagnostic testing: sleep test  I would like to see you back for sleep consult, sooner if we need to. Please call us with any interim questions, concerns, problems, updates or refill requests.   Please also call us for any test results so we can go over those with you on the phone.  My clinical assistant and will answer any of your questions and relay your messages to me and also relay most of my messages to you.   Our phone number is [phone]. We also have an after hours call service for urgent matters and there is a physician on-call for urgent questions. For any emergencies you know to call 911 or go to the nearest emergency room
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
## 2015-03-25 ENCOUNTER — Telehealth: Payer: Self-pay | Admitting: Family Medicine

## 2015-03-25 ENCOUNTER — Ambulatory Visit (INDEPENDENT_AMBULATORY_CARE_PROVIDER_SITE_OTHER): Payer: Medicare Other | Admitting: Family Medicine

## 2015-03-25 ENCOUNTER — Encounter: Payer: Self-pay | Admitting: Family Medicine

## 2015-03-25 VITALS — BP 112/77 | HR 72 | Temp 98.9°F | Wt 208.0 lb

## 2015-03-25 DIAGNOSIS — E038 Other specified hypothyroidism: Secondary | ICD-10-CM

## 2015-03-25 DIAGNOSIS — F919 Conduct disorder, unspecified: Secondary | ICD-10-CM | POA: Diagnosis not present

## 2015-03-25 DIAGNOSIS — G8929 Other chronic pain: Secondary | ICD-10-CM

## 2015-03-25 DIAGNOSIS — M549 Dorsalgia, unspecified: Secondary | ICD-10-CM

## 2015-03-25 DIAGNOSIS — R4189 Other symptoms and signs involving cognitive functions and awareness: Secondary | ICD-10-CM | POA: Diagnosis present

## 2015-03-25 DIAGNOSIS — R4689 Other symptoms and signs involving appearance and behavior: Principal | ICD-10-CM

## 2015-03-25 NOTE — Patient Instructions (Signed)
Dear [name], Thank you for coming in to clinic today. It was good to see you!  1. We will re-check your Thyroid hormones today, i will call you this week with results, may need to increase your dose of Levothyroxine thyroid medcine. 2. For your Back Pain - important to follow-up with Dr. Letta Pate on 8/29 for injection and re-evaluation, therapy 3. For your Sleep Study - please follow-up with Neurology Sleep Doctor on 8/18  Please overall be careful and cautious this month until we finish some further testing and injections. We know you are at risk for falling, and need you to avoid further injury.  Please schedule a follow-up appointment with Dr. Parks Ranger in 2 to 3 months for follow-up  If you have any other questions or concerns, please feel free to call the clinic to contact me. You may also schedule an earlier appointment if necessary.  However, if your symptoms get significantly worse, please go to the Emergency Department to seek immediate medical attention.  [name], [place]

## 2015-03-25 NOTE — Telephone Encounter (Signed)
Left message on voicemail asking whether she meant advanced directive instead of DNR

## 2015-03-25 NOTE — Assessment & Plan Note (Signed)
Stable, chronic LBP, known lumbar DJD, h/o compression fractures. - Established with Dr. Letta Pate now, 7/21 (re-established,  Previously improved with lumbar injections 3 years ago, lost to follow-up) - s/p R-hip injection relief x 2 week  Plan 1. Continue current med regimen - no changes today, tramadol, tylenol PRN, consider future switch to baclofen (now off flexeril) 2. Likely benefit most from further lumbar injections, continued follow-up with Dr. Letta Pate, additionally may need future PT 3. Not candidate for chronic narcotics with high fall risk, cognitive deficits, sedation 4. RTC PRN, follow-up as scheduled 03/2015 for initial lumbar injection, PT eval

## 2015-03-25 NOTE — Progress Notes (Signed)
   Subjective:    Patient ID: Wendy [name], female    DOB: 1961-06-19, 54 y.o.   MRN: [id number]  HPI  FOLLOW-UP / BACK PAIN, CHRONIC / RECURRENT FALLS: - Chronic hx known lumbar DJD, h/o osteopenia with compression fractures T12, L1 s/p kyphoplasty 08/2013. Followed by Dr. Melina Schools ([place]), no surgical recommendations. - Now re-established with Dr. Alysia Penna, previously followed about 3 years ago for similar complaints with LBP. S/p prior lumbar medial branch blocks L3-L4 and L5, however lost to follow-up for repeat injections. Last visit with Dr. Letta Pate on 03/14/15. Received R-hip injection with some relief initially, now seems to have return of pain 2 weeks later. No improvement on low back pain. Received PT eval, will likely benefit from future PT. Future plans for Lumbar injections 03/2015. - Today pain mostly unchanged, stable chronic bilateral lower back pain today without any worsening or improvement. Infrequent radiating pain in legs. 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  - Followed by [facility] (Dr. Jaynee Eagles). Last MRI (08/29/14) demonstrated stable findings since 2006, with known prior injury and encephalomalacia with gliosis. - Last apt with Dr. Jaynee Eagles on 03/21/15, given persistence of cognitive symptoms, further investigation concern for possible OSA as potential etiology for cognitive deficits. No prior sleep study. Referred to Dr. Brett Fairy for evaluation and possible PSG study. - Denies known sleep apnea events, vertigo, nausea, loss of vision, HA, active CP or SOB, palpitations  THYROID FOLLOW-UP: - Last visit 12/13/14 for hypothyroidism, taking Levothyroxine  46mcg daily, re-checked TSH remained elevated at 7.514 with normal Free T4 0.86. Advised via telephone to increase to 60mcg daily, new rx. - Ordered future TSH, Free T4 labs, patient never returned to get labs drawn. - Today patient continues Levothyroxine 47mcg daily. Obtained lab work. 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  Review of Systems  See above HPI    Objective:   Physical Exam  BP 112/77 mmHg  Pulse 72  Temp(Src) 98.9 F (37.2 C) (Oral)  Wt 208 lb (94.348 kg)  Gen - chronically ill-appearing, obese, comfortable, cooperative, pleasant, NAD Neck - supple, stable, non-tender, no thyromegaly MSK: - Back: mild +TTP over bilateral Lumbar paraspinal muscles with hypertonicity, reduced ROM flexion / ext Ext - no edema, non-tender, peripheral pulses intact +2 b/l  Skin - warm, dry, no rashes Neuro - awake, alert, oriented, muscle str 5/5 grip and ankle dorsiflexion, slow cautious gait     Assessment & Plan:   See specific A&P problem list for details.

## 2015-03-25 NOTE — Telephone Encounter (Signed)
Could someone mail pt DNR paperwork? Please advise

## 2015-03-25 NOTE — Assessment & Plan Note (Signed)
Gradual worsening cognitive decline over past >1-2 years. Assoc with memory deficits, in setting of depression, anxiety, agoraphobia. Known h/o TBI with coma x 6 weeks in 2006, likely major contributing factor to worsening. - Last MRI 08/2014 stable - Followed by [facility] - Dr. Jaynee Eagles, last apt 7/28  Plan: 1. Neurology has referred pt to Dr. Brett Fairy for sleep evaluation, possible OSA as potential major contributing factor to cognitive deficits. May proceed with PSG to either dx or rule out OSA 2. Follow-up with neuro as advised

## 2015-03-25 NOTE — Assessment & Plan Note (Signed)
Stable on Levothyroxine 15mcg daily, possible hypothyroidism may be contributing to falls / decreased energy  Plan: 1. Re-check TSH, Free T4 today, titrate levothyroxine as needed

## 2015-03-26 LAB — TSH: TSH: 3.563 u[IU]/mL (ref 0.350–4.500)

## 2015-03-26 LAB — T4, FREE: FREE T4: 0.72 ng/dL — AB (ref 0.80–1.80)

## 2015-03-26 NOTE — Telephone Encounter (Signed)
Spoke with patient patient, will mail a copy of advanced directive packet. Patient then says she has a question for PCP about their visit yesterday, when asked what specific questions she had she states she would rather speak with MD directly. Patient informed that MD was not in clinic today but I would give him the message. Will forward to PCP.

## 2015-03-26 NOTE — Telephone Encounter (Addendum)
I attempted to call patient once today, no answer. I will anticipate on calling her again tomorrow when I am back in [facility]. I do need to talk to her about her Thyroid labs as well. Also, I will clarify information regarding her advanced directive plans.  [name], [place], PGY-3

## 2015-03-27 ENCOUNTER — Other Ambulatory Visit: Payer: Self-pay | Admitting: Family Medicine

## 2015-03-27 MED ORDER — LEVOTHYROXINE SODIUM 75 MCG PO TABS: ORAL_TABLET | ORAL | Status: DC

## 2015-03-27 MED ORDER — LEVOTHYROXINE SODIUM 75 MCG PO TABS: 75.0000 ug | ORAL_TABLET | Freq: Every day | ORAL | Status: DC

## 2015-03-27 NOTE — Telephone Encounter (Signed)
Called patient 8/3, spoke with Wendy [name] today.  1. Questions - She wants to know exactly what diagnosis she has with regards to her memory loss and cognitive symptoms. I advised her that there are likely multifactorial contributing factors, but best diagnosis I can label it as would be "Traumatic Brain Injury with progressive cognitive deficits", likely worsened by Depression, Anxiety, Agoraphobia. Discussed that "brain injuries" don't heal easily and often times may never recover, can progressively worsen. All of this is worsened by aging, previous history of hep c, chronic pain syndrome, lumbar DJD, also polypharmacy can worsen cognitive function as well.  2. Thyroid function - dx hypothyroidism, now on levothyroxine 51mcg daily doing well. Checked labs 03/25/15, TSH 3.5 normal (down from 7.5), Free T4 0.72 low (from 0.82 borderline low), sent new rx for increased Levothyroxine to 3mcg daily to pharmacy, advised her to start new rx once pick-up, avoid confusion of cutting old pills in half. Re-check TFTs 6 months.  3. Advanced directive - she states this was mailed to her recently by [facility], will stay tuned. No further questions today. I support her decision to complete these papers at this time.  Wendy [name], [place], PGY-3

## 2015-03-27 NOTE — Addendum Note (Signed)
Addended by: Olin Hauser on: 03/27/2015 05:32 PM   Modules accepted: Orders

## 2015-03-28 ENCOUNTER — Other Ambulatory Visit: Payer: Self-pay | Admitting: Family Medicine

## 2015-03-28 DIAGNOSIS — G4700 Insomnia, unspecified: Secondary | ICD-10-CM

## 2015-04-03 ENCOUNTER — Telehealth: Payer: Self-pay | Admitting: Family Medicine

## 2015-04-03 NOTE — Telephone Encounter (Signed)
Pt called because she broken out with that rash again and it is worse. She is also eating so much more and gaining weight. jw

## 2015-04-03 NOTE — Telephone Encounter (Signed)
Left message for patient to return nurse call.  Derl Barrow, RN

## 2015-04-04 NOTE — Telephone Encounter (Signed)
Pt states that this "is not really a rash and it is itching from the inside out, if that makes sense".  States that this happened before when she had an issue with her liver.  Advised that she needs an appt with the MD, offered appt this pm but pt declines.  She is reluctantly agreeable to appt in the AM.  Advised if she starts to feel worse (i.e SOB, lightheadedness or confusion) she should go to the ED.  Pt is agreeable. [place], Salome Spotted

## 2015-04-05 ENCOUNTER — Telehealth: Payer: Self-pay | Admitting: Family Medicine

## 2015-04-05 ENCOUNTER — Ambulatory Visit: Payer: Self-pay | Admitting: Family Medicine

## 2015-04-05 DIAGNOSIS — K7682 Hepatic encephalopathy: Secondary | ICD-10-CM

## 2015-04-05 DIAGNOSIS — K7290 Hepatic failure, unspecified without coma: Secondary | ICD-10-CM

## 2015-04-05 DIAGNOSIS — J309 Allergic rhinitis, unspecified: Secondary | ICD-10-CM

## 2015-04-05 MED ORDER — LORATADINE 10 MG PO TABS: 10.0000 mg | ORAL_TABLET | Freq: Every day | ORAL | Status: DC

## 2015-04-05 MED ORDER — LACTULOSE 10 GM/15ML PO SOLN: 30.0000 g | Freq: Four times a day (QID) | ORAL | Status: DC | PRN

## 2015-04-05 NOTE — Telephone Encounter (Signed)
Pt is checking status of refill for lactulose, pt goes to [facility], also wants to know if pcp will prescribe generic claritin

## 2015-04-05 NOTE — Telephone Encounter (Signed)
Refilled Lactulose. Sent new rx for Generic Loratadine (Claritin) to pharmacy. Note the Loratadine will not be covered by her [employer] because it is OTC and not preferred.  [name], [place], PGY-3

## 2015-04-08 ENCOUNTER — Other Ambulatory Visit: Payer: Self-pay | Admitting: Family Medicine

## 2015-04-08 ENCOUNTER — Ambulatory Visit: Payer: Self-pay | Admitting: Family Medicine

## 2015-04-08 DIAGNOSIS — K7030 Alcoholic cirrhosis of liver without ascites: Secondary | ICD-10-CM

## 2015-04-09 ENCOUNTER — Other Ambulatory Visit: Payer: Self-pay | Admitting: *Deleted

## 2015-04-09 DIAGNOSIS — F4001 Agoraphobia with panic disorder: Secondary | ICD-10-CM

## 2015-04-09 MED ORDER — CLONAZEPAM 1 MG PO TABS
1.0000 mg | ORAL_TABLET | Freq: Two times a day (BID) | ORAL | Status: DC | PRN
Start: 2015-04-09 — End: 2015-05-27

## 2015-04-11 ENCOUNTER — Encounter: Payer: Self-pay | Admitting: Neurology

## 2015-04-11 ENCOUNTER — Ambulatory Visit (INDEPENDENT_AMBULATORY_CARE_PROVIDER_SITE_OTHER): Payer: Medicare Other | Admitting: Neurology

## 2015-04-11 VITALS — BP 132/82 | HR 72 | Resp 20 | Ht 62.0 in | Wt 212.0 lb

## 2015-04-11 DIAGNOSIS — G44019 Episodic cluster headache, not intractable: Secondary | ICD-10-CM | POA: Diagnosis not present

## 2015-04-11 DIAGNOSIS — R0683 Snoring: Secondary | ICD-10-CM | POA: Diagnosis not present

## 2015-04-11 DIAGNOSIS — G4719 Other hypersomnia: Secondary | ICD-10-CM | POA: Insufficient documentation

## 2015-04-11 NOTE — Patient Instructions (Signed)
Polysomnography (Sleep Studies) Polysomnography (PSG) is a series of tests used for detecting (diagnosing) obstructive sleep apnea and other sleep disorders. The tests measure how some parts of your body are working while you are sleeping. The tests are extensive and expensive. They are done in a sleep lab or hospital, and vary from center to center. Your caregiver may perform other more simple sleep studies and questionnaires before doing more complete and involved testing. Testing may not be covered by insurance. Some of these tests are:  An EEG (Electroencephalogram). This tests your brain waves and stages of sleep.  An EOG (Electrooculogram). This measures the movements of your eyes. It detects periods of REM (rapid eye movement) sleep, which is your dream sleep.  An EKG (Electrocardiogram). This measures your heart rhythm.  EMG (Electromyography). This is a measurement of how the muscles are working in your upper airway and your legs while sleeping.  An oximetry measurement. It measures how much oxygen (air) you are getting while sleeping.  Breathing efforts may be measured. The same test can be interpreted (understood) differently by different caregivers and centers that study sleep.  Studies may be given an apnea/hypopnea index (AHI). This is a number which is found by counting the times of no breathing or under breathing during the night, and relating those numbers to the amount of time spent in bed. When the AHI is greater than 15, the patient is likely to complain of daytime sleepiness. When the AHI is greater than 30, the patient is at increased risk for heart problems and must be followed more closely. Following the AHI also allows you to know how treatment is working. Simple oximetry (tracking the amount of oxygen that is taken in) can be used for screening patients who:  Do not have symptoms (problems) of OSA.  Have a normal Epworth Sleepiness Scale Score.  Have a low pre-test  probability of having OSA.  Have none of the upper airway problems likely to cause apnea.  Oximetry is also used to determine if treatment is effective in patients who showed significant desaturations (not getting enough oxygen) on their home sleep study. One extra measure of safety is to perform additional studies for the person who only snores. This is because no one can predict with absolute certainty who will have OSA. Those who show significant desaturations (not getting enough oxygen) are recommended to have a more detailed sleep study. Document Released: 02/14/2003 Document Revised: 11/02/2011 Document Reviewed: 10/16/2013 [facility] Patient Information 2015 [place], [place]. This information is not intended to replace advice given to you by your health care provider. Make sure you discuss any questions you have with your health care provider.    We are going to check your a revisit appointment after your sleep study is completed. We will meet and discuss the results is no organic reason for your fragmented sleep exists I would still like you to change some pattern as we have discussed in our visit today. Those are related to caffeine, TV watching in the bedroom, other activities besides sleeping in the bedroom.
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
## 2015-04-12 ENCOUNTER — Telehealth: Payer: Self-pay | Admitting: Family Medicine

## 2015-04-12 NOTE — Telephone Encounter (Signed)
Will forward to PCP for review. Please forward response back to red pool. Emmaclaire Switala, CMA.

## 2015-04-12 NOTE — Telephone Encounter (Signed)
Pt asking to speak with pcp re: her appt at [facility] yesterday, pt also is itching all over and is having trouble sleeping, asking to discuss with MD.

## 2015-04-12 NOTE — Telephone Encounter (Signed)
Reviewed chart, last seen by [facility] Dr. Brett Fairy for sleep evaluation. See detailed note for extensive sleep history, essentially patient with very poor sleep hygiene habits, and considered high risk for OSA also may be at risk for Central Sleep Apnea (with h/o TBI). Proceed with attended sleep study split protocol with capnography (additionally with cluster headaches).  Called patient to review current concerns about worsening itching and trouble sleeping.  1. [facility] / Sleep Study - She reviewed briefly her neurology apt with me, I re-emphasized importance of sleep hygiene that they had discussed with her. She is awaiting to hear back on when the sleep study will be scheduled.  1. Itching - Known chronic history of itching, likely related to her cirrhosis. Currently taking Hydroxyzine 25mg  about 1-2x daily with good relief but not lasting long enough. Denied feeling too sedated from this medication. I advised her that it is prescribed q 6 hr PRN, and she may try taking it every 6 to 8 hours (or total 3 to 4 times daily) for itching relief. Caution on sedation.  2. Trouble Sleeping - Re-emphasized that we would unlikely be able to solve her sleep / fatigue problems with a medication or med adjustment. Ultimately, she needs the upcoming sleep study and follow-up with [facility] / Dr. Brett Fairy. She was taking Trazodone 50mg  tabs (half 25mg ) qhs with some previously relief. I advised her to increase Trazodone to whole tab 50mg  qhs for now. If not helping, to call [facility] to follow-up further instructions.  [name], [place], PGY-3

## 2015-04-18 ENCOUNTER — Other Ambulatory Visit: Payer: Self-pay | Admitting: Family Medicine

## 2015-04-18 DIAGNOSIS — G8929 Other chronic pain: Secondary | ICD-10-CM

## 2015-04-18 DIAGNOSIS — M549 Dorsalgia, unspecified: Principal | ICD-10-CM

## 2015-04-18 NOTE — Telephone Encounter (Signed)
Pt calling and needs pain medication for back and legs. Would like to talk to PCP about this Fonda Kinder, ASA

## 2015-04-22 ENCOUNTER — Encounter: Payer: Medicare Other | Attending: Physical Medicine & Rehabilitation

## 2015-04-22 ENCOUNTER — Ambulatory Visit (HOSPITAL_BASED_OUTPATIENT_CLINIC_OR_DEPARTMENT_OTHER): Payer: Medicare Other | Admitting: Physical Medicine & Rehabilitation

## 2015-04-22 ENCOUNTER — Encounter: Payer: Self-pay | Admitting: Physical Medicine & Rehabilitation

## 2015-04-22 VITALS — BP 112/62 | HR 76 | Resp 14

## 2015-04-22 DIAGNOSIS — G9389 Other specified disorders of brain: Secondary | ICD-10-CM | POA: Diagnosis not present

## 2015-04-22 DIAGNOSIS — M545 Low back pain: Secondary | ICD-10-CM | POA: Diagnosis not present

## 2015-04-22 DIAGNOSIS — K219 Gastro-esophageal reflux disease without esophagitis: Secondary | ICD-10-CM | POA: Diagnosis not present

## 2015-04-22 DIAGNOSIS — G8929 Other chronic pain: Secondary | ICD-10-CM | POA: Insufficient documentation

## 2015-04-22 DIAGNOSIS — I10 Essential (primary) hypertension: Secondary | ICD-10-CM | POA: Insufficient documentation

## 2015-04-22 DIAGNOSIS — M47816 Spondylosis without myelopathy or radiculopathy, lumbar region: Secondary | ICD-10-CM | POA: Diagnosis not present

## 2015-04-22 NOTE — Progress Notes (Signed)
Bilateral Lumbar L3, L4  medial branch blocks and L 5 dorsal ramus injection under fluoroscopic guidance  Indication: Lumbar pain which is not relieved by medication management or other conservative care and interfering with self-care and mobility.  Informed consent was obtained after describing risks and benefits of the procedure with the patient, this includes bleeding, infection, paralysis and medication side effects.  The patient wishes to proceed and has given written consent.  The patient was placed in prone position.  The lumbar area was marked and prepped with Betadine.  One mL of 1% lidocaine was injected into each of 6 areas into the skin and subcutaneous tissue.  Then a 22-gauge 5in spinal needle was inserted targeting the junction of the left S1 superior articular process and sacral ala junction. Needle was advanced under fluoroscopic guidance.  Bone contact was made.  Omnipaque 180 was injected x 0.5 mL demonstrating no intravascular uptake.  Then a solution containing one mL of 4 mg per mL dexamethasone and 3 mL of 2% MPF lidocaine was injected x 0.5 mL.  Then the left L5 superior articular process in transverse process junction was targeted.  Bone contact was made.  Omnipaque 180 was injected x 0.5 mL demonstrating no intravascular uptake. Then a solution containing one mL of 4 mg per mL dexamethasone and 3 mL of 2% MPF lidocaine was injected x 0.5 mL.  Then the left L4 superior articular process in transverse process junction was targeted.  Bone contact was made.  Omnipaque 180 was injected x 0.5 mL demonstrating no intravascular uptake.  Then a solution containing one mL of 4 mg per mL dexamethasone and 3 mL if 2% MPF lidocaine was injected x 0.5 mL.  This same procedure was performed on the right side using the same needle, technique and injectate.  Patient tolerated procedure well.  Post procedure instructions were given. 

## 2015-04-22 NOTE — Progress Notes (Signed)
  PROCEDURE RECORD [facility]   Name: Wendy [name] DOB:12-28-1960 MRN: [id number]  Date:04/22/2015  Physician: Alysia Penna, MD    Nurse/CMA: Wenda Overland, RN Allergies:  Allergies  Allergen Reactions  . Codeine Phosphate Itching  . Codeine Rash    Consent Signed: Yes.    Is patient diabetic? No.  CBG today?  Pregnant: No. LMP: No LMP recorded. Patient is postmenopausal. (age 55-55)  Anticoagulants: no Anti-inflammatory: no Antibiotics: no  Procedure: Bilateral  Medial Branch BlockPosition: Prone Start Time: 2:45 End Time:  2:49 Fluoro Time: 45 seconds  RN/CMA Mancel Parsons Sybil Shumaker    Time 2:00PM 3:00    BP 112/62 126/79    Pulse 73 72    Respirations 14 14    O2 Sat 93 97    S/S 6 6    Pain Level 8/10 5/10     D/C home with friend, patient A & O X 3, D/C instructions reviewed, and sits independently.

## 2015-04-22 NOTE — Patient Instructions (Signed)

## 2015-04-23 ENCOUNTER — Other Ambulatory Visit: Payer: Self-pay | Admitting: Family Medicine

## 2015-04-23 DIAGNOSIS — M549 Dorsalgia, unspecified: Principal | ICD-10-CM

## 2015-04-23 DIAGNOSIS — G8929 Other chronic pain: Secondary | ICD-10-CM

## 2015-04-26 ENCOUNTER — Other Ambulatory Visit: Payer: Self-pay | Admitting: Family Medicine

## 2015-04-26 DIAGNOSIS — G4700 Insomnia, unspecified: Secondary | ICD-10-CM

## 2015-04-26 DIAGNOSIS — F331 Major depressive disorder, recurrent, moderate: Secondary | ICD-10-CM

## 2015-04-27 ENCOUNTER — Other Ambulatory Visit (HOSPITAL_COMMUNITY): Payer: Self-pay | Admitting: Psychiatry

## 2015-04-30 ENCOUNTER — Telehealth: Payer: Self-pay | Admitting: Family Medicine

## 2015-04-30 NOTE — Telephone Encounter (Signed)
Pt is in pain and is itching half to death.  Pt appt move to 9-13.  If dr could send her in something that would be great

## 2015-05-01 ENCOUNTER — Telehealth: Payer: Self-pay | Admitting: Family Medicine

## 2015-05-01 DIAGNOSIS — L299 Pruritus, unspecified: Secondary | ICD-10-CM

## 2015-05-01 NOTE — Telephone Encounter (Signed)
Reviewed chart, complex PMH with known chronic refractory itching (pruritus) for years. Called patient back, she reports generalized itching, intermittent episodes, currently worse recently without any new changes or triggers/exposures, no rash or associated symptoms. Previously helped by Hydroxyzine 25mg  1-3x daily.  Discussed her case with Dr. Nori Riis and Dr. Valentina Lucks, reviewed her current med list, no significant red flags identified, going over her history it seems like her pruritus may be related to her known cirrhosis (s/p Hep C cured from [facility], however her liver functions seem stable and previous labs without hyperbili or obvious etiology for pruritus). Additionally, considered anxiety / psychological component. Not considered a good candidate for Doxepin (TCA) given risk of orthostatic hypotension with already low BP, high fall risk. Could consider future trial on cholestyramine.  Decision to increase Hydroxyzine to 2 tabs or 50mg  per dose q 8 hr PRN for now for 1-2 week trial to see if improves. Additionally advised to her to try some topical OTC therapies, including moisturizers, benadryl or capsaicin if localized.  Clarified patient's apt, she is scheduled for sleep study 05/07/15 at 8pm. I will follow-up as needed over next few weeks, if she is worsening.  Wendy [name], [place], PGY-3

## 2015-05-01 NOTE — Telephone Encounter (Signed)
Pt calling and states that she needs a new rx for Hydroxyzine with an increased dosage. "Because medicaid won't cover it" per patient. Thank you, Fonda Kinder, ASA

## 2015-05-01 NOTE — Telephone Encounter (Signed)
Calling back second time to request rx for condition.  Please contact patient to inform when rx has been completed.

## 2015-05-02 ENCOUNTER — Other Ambulatory Visit: Payer: Self-pay | Admitting: Family Medicine

## 2015-05-02 DIAGNOSIS — F331 Major depressive disorder, recurrent, moderate: Secondary | ICD-10-CM

## 2015-05-02 MED ORDER — HYDROXYZINE HCL 25 MG PO TABS: 25.0000 mg | ORAL_TABLET | Freq: Three times a day (TID) | ORAL | Status: DC | PRN

## 2015-05-02 MED ORDER — ESCITALOPRAM OXALATE 10 MG PO TABS: 5.0000 mg | ORAL_TABLET | Freq: Every day | ORAL | Status: DC

## 2015-05-02 NOTE — Telephone Encounter (Signed)
Received medication request from [facility] for Lexapro 10mg . Per Dr. De Nurse, medication request is denied. Pt will need to schedule an appointment with the clinic.

## 2015-05-02 NOTE — Telephone Encounter (Signed)
Re-ordered Hydroxyzine 25mg  tabs - take 1-2 tabs (25-50mg ) q 8 hr PRN itching, #60 +2 refills.  [name], [place], PGY-3

## 2015-05-02 NOTE — Telephone Encounter (Signed)
Pt is requesting refill on her lexapro, Pt goes to [facility]

## 2015-05-07 ENCOUNTER — Telehealth: Payer: Self-pay | Admitting: Family Medicine

## 2015-05-07 NOTE — Telephone Encounter (Signed)
Pt is calling to inform PCP that the initial sleep study was canceled due to a technical malfunction, and it has been rescheduled to Oct. 13, 2016. Thank you, Fonda Kinder, ASA

## 2015-05-13 ENCOUNTER — Ambulatory Visit: Payer: Self-pay | Admitting: Family Medicine

## 2015-05-14 NOTE — Telephone Encounter (Signed)
error 

## 2015-05-20 ENCOUNTER — Ambulatory Visit: Payer: Self-pay | Admitting: Family Medicine

## 2015-05-27 ENCOUNTER — Ambulatory Visit (INDEPENDENT_AMBULATORY_CARE_PROVIDER_SITE_OTHER): Payer: Medicare Other | Admitting: Family Medicine

## 2015-05-27 ENCOUNTER — Encounter: Payer: Self-pay | Admitting: Family Medicine

## 2015-05-27 ENCOUNTER — Other Ambulatory Visit: Payer: Self-pay | Admitting: *Deleted

## 2015-05-27 ENCOUNTER — Ambulatory Visit (HOSPITAL_BASED_OUTPATIENT_CLINIC_OR_DEPARTMENT_OTHER): Payer: Medicare Other | Admitting: Physical Medicine & Rehabilitation

## 2015-05-27 ENCOUNTER — Encounter: Payer: Medicare Other | Attending: Physical Medicine & Rehabilitation

## 2015-05-27 ENCOUNTER — Encounter: Payer: Self-pay | Admitting: Physical Medicine & Rehabilitation

## 2015-05-27 VITALS — BP 115/70 | HR 84 | Temp 97.6°F | Ht 62.0 in | Wt 199.0 lb

## 2015-05-27 VITALS — BP 131/80 | HR 80

## 2015-05-27 DIAGNOSIS — R296 Repeated falls: Secondary | ICD-10-CM | POA: Diagnosis not present

## 2015-05-27 DIAGNOSIS — G9389 Other specified disorders of brain: Secondary | ICD-10-CM | POA: Diagnosis not present

## 2015-05-27 DIAGNOSIS — F4001 Agoraphobia with panic disorder: Secondary | ICD-10-CM

## 2015-05-27 DIAGNOSIS — M545 Low back pain: Secondary | ICD-10-CM | POA: Insufficient documentation

## 2015-05-27 DIAGNOSIS — M47816 Spondylosis without myelopathy or radiculopathy, lumbar region: Secondary | ICD-10-CM

## 2015-05-27 DIAGNOSIS — G8929 Other chronic pain: Secondary | ICD-10-CM

## 2015-05-27 DIAGNOSIS — I10 Essential (primary) hypertension: Secondary | ICD-10-CM

## 2015-05-27 DIAGNOSIS — M549 Dorsalgia, unspecified: Secondary | ICD-10-CM

## 2015-05-27 DIAGNOSIS — K219 Gastro-esophageal reflux disease without esophagitis: Secondary | ICD-10-CM | POA: Diagnosis not present

## 2015-05-27 DIAGNOSIS — L299 Pruritus, unspecified: Secondary | ICD-10-CM

## 2015-05-27 DIAGNOSIS — Z23 Encounter for immunization: Secondary | ICD-10-CM | POA: Diagnosis not present

## 2015-05-27 MED ORDER — CARVEDILOL 12.5 MG PO TABS: 12.5000 mg | ORAL_TABLET | Freq: Two times a day (BID) | ORAL | Status: DC

## 2015-05-27 MED ORDER — CLONAZEPAM 1 MG PO TABS: 1.0000 mg | ORAL_TABLET | Freq: Two times a day (BID) | ORAL | Status: DC | PRN

## 2015-05-27 MED ORDER — HYDROXYZINE HCL 25 MG PO TABS: 25.0000 mg | ORAL_TABLET | Freq: Two times a day (BID) | ORAL | Status: DC | PRN

## 2015-05-27 MED ORDER — TRAMADOL HCL 50 MG PO TABS: 50.0000 mg | ORAL_TABLET | Freq: Three times a day (TID) | ORAL | Status: DC | PRN

## 2015-05-27 MED ORDER — GABAPENTIN 600 MG PO TABS: 600.0000 mg | ORAL_TABLET | Freq: Three times a day (TID) | ORAL | Status: DC

## 2015-05-27 NOTE — Assessment & Plan Note (Addendum)
Stable, chronic LBP, known lumbar DJD, h/o compression fractures - Followed by Dr. Letta Pate (PMR) last apt 8/29 for b/l Lumbar L3, L4, L5, medial nerve branch block inj, seems to have had mild relief (1-2 weeks), no longer improved  Plan 1. Increased Tramadol from 50mg  TID PRn to 50-100mg  TID PRN, previously tried Tramadol 100mg  per dose with relief, but now has stopped taking this. Again concerned about using stronger narcotics, and goal to keep on Tramadol or less for pain control. 2. F/u with Dr. Letta Pate for further val with injections, visit today 10/3, may need future PT 3. Not candidate for chronic narcotics with high fall risk, cognitive deficits, sedation 4. RTC 2-3 mo chronic pain

## 2015-05-27 NOTE — Assessment & Plan Note (Signed)
Improved on Hydroxyzine 50mg  BID PRN, from 25mg  1-2x daily PRN. Complex situation likely w/ history of cirrhosis (s/p harvoni for hep c), also with multifactorial anxiety / psych component, chronic pain on tramadol. Limited options. Not candidate for Doxepin (TCA).  Plan: 1. Continue increased Hydroxyzine dose 50mg  BID PRN (take 2 tabs 25mg ), new rx sent to pharmacy 2. Consider future Cholestyramine

## 2015-05-27 NOTE — Patient Instructions (Addendum)
Dear [name], Thank you for coming in to clinic today. It was good to see you!  1. For your Back Pain - important to follow-up with Dr. Letta Pate later today for follow-up re-evaluation - Increased Tramadol to 2 pills every 8 hours or 3 times a day as needed for worsening pain - For itching, may take Hydroxyzine 25mg  tabs - take 2 of these twice daily as needed  Please schedule a follow-up appointment with Dr. Parks Ranger in 2 to 3 months for follow-up  If you have any other questions or concerns, please feel free to call the clinic to contact me. You may also schedule an earlier appointment if necessary.  However, if your symptoms get significantly worse, please go to the Emergency Department to seek immediate medical attention.  [name], [name]

## 2015-05-27 NOTE — Progress Notes (Addendum)
  PROCEDURE RECORD [facility]   Name: BRITTANE GRUDZINSKI DOB:Feb 27, 1961 MRN: [id number]  Date:05/27/2015  Physician: Alysia Penna, MD    Nurse/CMA: Mancel Parsons  Allergies:  Allergies  Allergen Reactions  . Codeine Phosphate Itching  . Codeine Rash    Consent Signed: Yes.    Is patient diabetic? No.  CBG today?   Pregnant: No. LMP: No LMP recorded. Patient is postmenopausal. (age 56-55)  Anticoagulants: no Anti-inflammatory: no Antibiotics: no  Procedure: right L3,4,5 radiofrequency neurotomy  Position: Prone Start Time: 2:47pm  End Time: 3:07pm Fluoro Time: 64  RN/CMA Rolan Bucco Adonys Wildes    Time 2:10 pm 3:10pm    BP 131/80 146/78    Pulse 80 91    Respirations 14 14    O2 Sat 97 98    S/S 6 6    Pain Level 7/10 4/10     D/C home with friend, patient A & O X 3, D/C instructions reviewed, and sits independently.

## 2015-05-27 NOTE — Progress Notes (Signed)
Subjective:    Patient ID: Wendy [name], female    DOB: 07-24-1961, 54 y.o.   MRN: [id number]  Wendy [name] is a 54 y.o. female presenting on 05/27/2015 for Extremity Weakness and memory problems  HPI  FOLLOW-UP / BACK PAIN, CHRONIC / RECURRENT FALLS: - Last seen by me on 03/25/15 for this complaint, now established with Dr. Letta Pate last seen 04/22/15 for b/l Lumbar L3, L4, medial branch nerve block injections and L 5 dorsal ramus injections, patient reports some moderate relief for days to weeks, but denies significant relief beyond 3-4 weeks. - Chronic hx known lumbar DJD, h/o osteopenia with compression fractures T12, L1 s/p kyphoplasty 08/2013 [facility]) - Today pain mostly unchanged, stable chronic bilateral lower back pain today without any worsening or improvement. Infrequent radiating pain in legs. Also with some Left hip pain radiating from lower back. Gradually worsening throughout the day, difficult to identify her pain on scale, does frequently admit to pain up to 10/10, limiting her ambulation and activities. Taking Tramadol 50mg  TID without significant relief, has not tried to increase to 100mg  (2 tabs per dose) despite prior recommendations. - Has apt today with Dr. Letta Pate for follow-up after prior injections - Denies any numbness or weakness, bladder or bowel incontinence, saddle anesthesia  GENERALIZED PRURITUS: - Known chronic refractory itching (pruritus) for years. Discussed via phone in 04/2015 when she reported generalized itching, intermittent episodes, currently worse recently without any new changes or triggers/exposures, no rash or associated symptoms. Previously helped by Hydroxyzine 25mg  1-3x daily. Advised her to increase Hydroxyzine to 50mg  (take 2 of 25mg  tabs) BID PRN. At that time I had discussed her case with preceptors and Dr. Valentina Lucks, likely pruritus related to known cirrhosis, but also strongly suspect anxiety / psych component with refractory  pain and maybe even related to chronic pain medicine with Tramadol. - Today she states improved itching on dose of 50mg  hydroxyzine, denies significant sedation from this dose    Past Medical History  Diagnosis Date  . Hepatic cirrhosis due to chronic hepatitis C infection ([place])     ETOH causative as well  . Hiatal hernia   . Esophageal stricture     esophageal dysmotility and chronic dysphagia as well  . 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 History of alcohol abuse   . Anxiety and depression   . History of hip fracture   . Allergic rhinitis   . HTN (hypertension)   . Blood transfusion   . Cataract     MILD  . GERD (gastroesophageal reflux disease)   . Osteoporosis   . Ulcer     2007  . Arthritis   . Clotting disorder ([place])     prolonged clotting time due to liver disease  . Paraesophageal hernia   . Compression fracture 09/21/2013  . Hepatic encephalopathy syndrome [facility])     Social History   Social History  . Marital Status: Widowed    Spouse Name: N/A  . Number of Children: 2  . Years of Education: 12+   Occupational History  . Disabled    Social History Main Topics  . Smoking status: Former Smoker -- 0.25 packs/day for 10 years    Types: Cigarettes    Quit date: 12/23/2008  . Smokeless tobacco: Never Used  . Alcohol Use: No     Comment: goes to AA  . Drug Use: No  . Sexual Activity: No   Other Topics Concern  .  Not on file   Social History Narrative   Just before her husband died, she had a miscarriage and started to drink beer heavily.  She quit secondary to her liver disease and has been quit for several years.  She denies the use of drugs - prescription or otherwise.      Current Outpatient Prescriptions on File Prior to Visit  Medication Sig  . alendronate (FOSAMAX) 70 MG tablet Take 70 mg by mouth.  . bisacodyl ([employer] GENTLE LAXATIVE) 5 MG EC tablet TAKE 1 TABLET (5 MG TOTAL) BY MOUTH DAILY.  . carvedilol (COREG) 12.5 MG  tablet Take 1 tablet (12.5 mg total) by mouth 2 (two) times daily with a meal.  . Cholecalciferol (VITAMIN D3) 2000 UNITS capsule Take 2,000 Units by mouth daily.  . cyclobenzaprine (FLEXERIL) 10 MG tablet TAKE 1/2 TABLET 3 TIMES A DAY AS NEEDED FOR MUSCLE SPASM  . cycloSPORINE (RESTASIS) 0.05 % ophthalmic emulsion Place 1 drop into [name] eyes 2 (two) times daily as needed (For dry eyes.).  Marland Kitchen docusate sodium (COLACE) 100 MG capsule TAKE ONE CAPSULE BY MOUTH TWICE A DAY  . escitalopram (LEXAPRO) 10 MG tablet Take 0.5 tablets (5 mg total) by mouth daily.  . furosemide (LASIX) 40 MG tablet TAKE 1/2 TABLET EVERY DAY  . lactulose (CHRONULAC) 10 GM/15ML solution Take 45 mLs (30 g total) by mouth 4 (four) times daily as needed (for constipation).  Marland Kitchen levothyroxine (SYNTHROID, LEVOTHROID) 75 MCG tablet Take 1 tablet (75 mcg total) by mouth daily before breakfast.  . loratadine (CLARITIN) 10 MG tablet Take 1 tablet (10 mg total) by mouth daily.  . Multiple Vitamins-Minerals (CENTRUM SILVER ULTRA WOMENS PO) Take by mouth.  . Olopatadine HCl 0.2 % SOLN Place 1 drop into [name] eyes every morning.   Marland Kitchen omeprazole (PRILOSEC) 20 MG capsule   . polyethylene glycol (MIRALAX) packet Take 17 g by mouth 2 (two) times daily.  . QUEtiapine (SEROQUEL) 200 MG tablet TAKE 1 TABLET BY MOUTH AT BEDTIME  . rifaximin (XIFAXAN) 550 MG TABS tablet Take 550 mg by mouth 2 (two) times daily.  Marland Kitchen spironolactone (ALDACTONE) 100 MG tablet TAKE 1 TABLET EVERY DAY  . traZODone (DESYREL) 50 MG tablet TAKE 1/2-1 TABLET BY MOUTH AT BEDTIME AS NEEDED FOR SLEEP   No current facility-administered medications on file prior to visit.    Review of Systems  Constitutional: Negative for fever, chills, diaphoresis, activity change, appetite change and fatigue.  HENT: Negative for congestion and hearing loss.   Eyes: Negative for visual disturbance.  Respiratory: Negative for cough, chest tightness, shortness of breath and wheezing.     Cardiovascular: Negative for chest pain, palpitations and leg swelling.  Gastrointestinal: Negative for nausea, vomiting, abdominal pain, diarrhea and constipation.  Genitourinary: Negative for dysuria, frequency and hematuria.  Musculoskeletal: Positive for back pain, arthralgias and gait problem. Negative for neck pain.  Skin: Negative for rash.  Neurological: Positive for weakness. Negative for dizziness, light-headedness, numbness and headaches.  Hematological: Negative for adenopathy.  Psychiatric/Behavioral: Positive for confusion, sleep disturbance and decreased concentration. Negative for suicidal ideas, hallucinations, behavioral problems, self-injury and dysphoric mood. The patient is nervous/anxious. The patient is not hyperactive.    Per HPI unless specifically indicated above     Objective:    BP 115/70 mmHg  Pulse 84  Temp(Src) 97.6 F (36.4 C) (Oral)  Ht 5\' 2"  (1.575 m)  Wt 199 lb (90.266 kg)  BMI 36.39 kg/m2  Wt Readings from Last 3 Encounters:  05/27/15 199 lb (90.266 kg)  04/11/15 212 lb (96.163 kg)  03/25/15 208 lb (94.348 kg)    Physical Exam  Constitutional: She is oriented to person, place, and time. She appears well-developed and well-nourished. No distress.  chronically ill-appearing, obese, comfortable, cooperative, pleasant, NAD  HENT:  Head: Normocephalic and atraumatic.  Mouth/Throat: Oropharynx is clear and moist.  Eyes: Conjunctivae and EOM are normal. Pupils are equal, round, and reactive to light.  Neck: Normal range of motion. Neck supple. No thyromegaly present.  Cardiovascular: Normal rate, regular rhythm, normal heart sounds and intact distal pulses.   No murmur heard. Pulmonary/Chest: Effort normal and breath sounds normal. No respiratory distress. She has no wheezes. She has no rales.  Abdominal: Soft. Bowel sounds are normal. She exhibits no distension and no mass. There is no tenderness.  Musculoskeletal: Normal range of motion. She  exhibits no edema or tenderness.  Back: mild +TTP over bilateral Lumbar paraspinal muscles with hypertonicity, reduced ROM flexion / ext low back  Lymphadenopathy:    She has no cervical adenopathy.  Neurological: She is alert and oriented to person, place, and time.  Skin: Skin is warm and dry. No rash noted. She is not diaphoretic.  Psychiatric: She has a normal mood and affect. Her behavior is normal.  Nursing note and vitals reviewed.  Results for orders placed or performed in visit on 03/25/15  TSH  Result Value Ref Range   TSH 3.563 0.350 - 4.500 uIU/mL  T4, Free  Result Value Ref Range   Free T4 0.72 (L) 0.80 - 1.80 ng/dL      Assessment & Plan:   Problem List Items Addressed This Visit      Musculoskeletal and Integument   Lumbar spondylosis   Relevant Medications   traMADol (ULTRAM) 50 MG tablet     Other   Chronic back pain - Primary    Stable, chronic LBP, known lumbar DJD, h/o compression fractures - Followed by Dr. Letta Pate (PMR) last apt 8/29 for b/l Lumbar L3, L4, L5, medial nerve branch block inj, seems to have had mild relief (1-2 weeks), no longer improved  Plan 1. Increased Tramadol from 50mg  TID PRn to 50-100mg  TID PRN, previously tried Tramadol 100mg  per dose with relief, but now has stopped taking this. Again concerned about using stronger narcotics, and goal to keep on Tramadol or less for pain control. 2. F/u with Dr. Letta Pate for further val with injections, visit today 10/3, may need future PT 3. Not candidate for chronic narcotics with high fall risk, cognitive deficits, sedation 4. RTC 2-3 mo chronic pain      Relevant Medications   traMADol (ULTRAM) 50 MG tablet   Generalized pruritus    Improved on Hydroxyzine 50mg  BID PRN, from 25mg  1-2x daily PRN. Complex situation likely w/ history of cirrhosis (s/p harvoni for hep c), also with multifactorial anxiety / psych component, chronic pain on tramadol. Limited options. Not candidate for Doxepin  (TCA).  Plan: 1. Continue increased Hydroxyzine dose 50mg  BID PRN (take 2 tabs 25mg ), new rx sent to pharmacy 2. Consider future Cholestyramine      Relevant Medications   hydrOXYzine (ATARAX/VISTARIL) 25 MG tablet   PANIC DISORDER WITH AGORAPHOBIA    Stable, controlled on Klonopin  Plan: 1. Refilled Klonopin x 3 mo 2. Continue Lexapro      Relevant Medications   clonazePAM (KLONOPIN) 1 MG tablet   Recurrent falls      Meds ordered this encounter  Medications  .  traMADol (ULTRAM) 50 MG tablet    Sig: Take 1-2 tablets (50-100 mg total) by mouth every 8 (eight) hours as needed (For pain.).    Dispense:  120 tablet    Refill:  2  . hydrOXYzine (ATARAX/VISTARIL) 25 MG tablet    Sig: Take 1-2 tablets (25-50 mg total) by mouth 2 (two) times daily as needed for itching.    Dispense:  90 tablet    Refill:  2  . clonazePAM (KLONOPIN) 1 MG tablet    Sig: Take 1 tablet (1 mg total) by mouth 2 (two) times daily as needed for anxiety.    Dispense:  60 tablet    Refill:  2    Received Flu shot today  Follow up plan: Return in about 3 months (around 08/27/2015) for chronic pain.  A total of 25 minutes was spent face-to-face with this patient. Over half this time was spent on counseling patient on the diagnosis and therapeutic options available.  [name], [place], PGY-3

## 2015-05-27 NOTE — Assessment & Plan Note (Signed)
Stable, controlled on Klonopin  Plan: 1. Refilled Klonopin x 3 mo 2. Continue Lexapro

## 2015-05-27 NOTE — Progress Notes (Addendum)
Subjective:    Patient ID: Wendy [name], female    DOB: 1961-04-06, 54 y.o.   MRN: [id number]  HPI  Pain Inventory Average Pain 6 Pain Right Now 6 My pain is intermittent  In the last 24 hours, has pain interfered with the following? General activity 6 Relation with others 6 Enjoyment of life 6 What TIME of day is your pain at its worst? evening ang night Sleep (in general) Good  Pain is worse with: all Pain improves with: medication Relief from Meds: 4  Mobility walk without assistance walk with assistance how many minutes can you walk? 5 minutes ability to climb steps?  no do you drive?  no use a wheelchair Do you have any goals in this area?  yes  Function retired I need assistance with the following:  dressing, bathing, toileting, meal prep, household duties and shopping Do you have any goals in this area?  no  Neuro/Psych tingling trouble walking dizziness confusion depression anxiety loss of taste or smell  Prior Studies Any changes since last visit?  no  Physicians involved in your care Any changes since last visit?  no   Family History  Problem Relation Age of Onset  . Heart disease Mother   . Anxiety disorder Mother   . Colon cancer Neg Hx   . Other Daughter     chromosome abnormalit  . Other Daughter     micropthalmia   Social History   Social History  . Marital Status: Widowed    Spouse Name: N/A  . Number of Children: 2  . Years of Education: 12+   Occupational History  . Disabled    Social History Main Topics  . Smoking status: Former Smoker -- 0.25 packs/day for 10 years    Types: Cigarettes    Quit date: 12/23/2008  . Smokeless tobacco: Never Used  . Alcohol Use: No     Comment: goes to AA  . Drug Use: No  . Sexual Activity: No   Other Topics Concern  . None   Social History Narrative   Just before her husband died, she had a miscarriage and started to drink beer heavily.  She quit secondary to her liver  disease and has been quit for several years.  She denies the use of drugs - prescription or otherwise.     Past Surgical History  Procedure Laterality Date  . Orif hip fracture  2010    right x2  . Upper gastrointestinal endoscopy  08/05/2007    esophageal ring, hiatal hernia, portal gastropathy  . Splenectomy      age 1  . Burr hole for subdural hematoma  2004  . Upper gastrointestinal endoscopy  10/14/2011  . Colonoscopy  08/2012    moderatel left colon tics  . Kyphoplasty Bilateral 09/21/2013    Procedure: T12 - L1 KYPHOPLASTY;  Surgeon: Melina Schools, MD;  Location: [facility];  Service: Orthopedics;  Laterality: Bilateral;  . Esophagogastroduodenoscopy (egd) with propofol N/A 01/26/2014    Procedure: ESOPHAGOGASTRODUODENOSCOPY (EGD) WITH PROPOFOL;  Surgeon: Lafayette Dragon, MD;  Location[facility] ENDOSCOPY;  Service: Endoscopy;  Laterality: N/A;   Past Medical History  Diagnosis Date  . Hepatic cirrhosis due to chronic hepatitis C infection ([place])     ETOH causative as well  . Hiatal hernia   . Esophageal stricture     esophageal dysmotility and chronic dysphagia as well  . 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 History of alcohol  abuse   . Anxiety and depression   . History of hip fracture   . Allergic rhinitis   . HTN (hypertension)   . Blood transfusion   . Cataract     MILD  . GERD (gastroesophageal reflux disease)   . Osteoporosis   . Ulcer     2007  . Arthritis   . Clotting disorder ([place])     prolonged clotting time due to liver disease  . Paraesophageal hernia   . Compression fracture 09/21/2013  . Hepatic encephalopathy syndrome ([facility])    BP 131/80 mmHg  Pulse 80  SpO2 97%  Opioid Risk Score:   Fall Risk Score:  `1  Depression screen PHQ 2/9  Depression screen [facility] 2/9 03/14/2015 01/31/2015 12/13/2014 09/04/2014 01/18/2014 01/11/2014 03/16/2013  Decreased Interest 3 1 3  0 0 1 3  Down, Depressed, Hopeless 2 1 2 3  0 1 3  PHQ - 2 Score 5 2 5 3  0 2 6    Altered sleeping 3 1 1  - - - 3  Tired, decreased energy 3 1 2  - - - 3  Change in appetite 3 1 0 - - - 1  Feeling bad or failure about yourself  2 1 1  - - - 3  Trouble concentrating 3 1 3  - - - 3  Moving slowly or fidgety/restless 2 1 0 - - - 3  Suicidal thoughts 0 0 0 - - - 0  PHQ-9 Score 21 8 12  - - - 22  Difficult doing work/chores - - Somewhat difficult - - - -    Review of Systems  Neurological: Positive for dizziness.       Tingling Confusion    Psychiatric/Behavioral: Positive for dysphoric mood.  All other systems reviewed and are negative.      Objective:   Physical Exam        Assessment & Plan:  1.  Lumbar spondylosis improved by at least 50% on 2 occasions with Bilateral L3,4,5 MBB  Good candidate for RF  RightL5 dorsal ramus., Right L4 and Right L3 medial branch radio frequency neuropathy under fluoroscopic guidance   Indication: Low back pain due to lumbar spondylosis which has been relieved on 2 occasions by greater than 50% by lumbar medial branch blocks at corresponding levels.  Informed consent was obtained after describing risks and benefits of the procedure with the patient, this includes bleeding, bruising, infection, paralysis and medication side effects. The patient wishes to proceed and has given written consent. The patient was placed in a prone position. The lumbar and sacral area was marked and prepped with Betadine. A 25-gauge 1-1/2 inch needle was inserted into the skin and subcutaneous tissue at 3 sites in one ML of 1% lidocaine was injected into each site. Then a 20-gauge 15 cm radio frequency needle with a 1 cm curved active tip was inserted targeting the Right S1 SAP/sacral ala junction. Bone contact was made and confirmed with lateral imaging. Sensory stimulation at 50 Hz followed by motor stimulation at 2 Hz confirm proper needle location followed by injection of one ML of the solution containing one ML of 4 mg per mL dexamethasone and 3 mL of  1% MPF lidocaine. Then the Right L5 SAP/transverse process junction was targeted. Bone contact was made and confirmed with lateral imaging. Sensory stimulation at 50 Hz followed by motor stimulation at 2 Hz confirm proper needle location followed by injection of one ML of the solution containing one ML of 4 mg per mL  dexamethasone and 3 mL of 1% MPF lidocaine. Then the Right L4 SAP/transverse process junction was targeted. Bone contact was made and confirmed with lateral imaging. Sensory stimulation at 50 Hz followed by motor stimulation at 2 Hz confirm proper needle location followed by injection of one ML of the solution containing one ML of 4 mg per mL dexamethasone and 3 mL of 1% MPF lidocaine. Radio frequency lesion being at [facility] for 90 seconds was performed. Needles were removed. Post procedure instructions and vital signs were performed. Patient tolerated procedure well. Followup appointment was given.

## 2015-05-27 NOTE — Patient Instructions (Addendum)
You had a radio frequency procedure today This was done to alleviate joint pain in your lumbar area We injected a combination of dexamethasone which is a steroid as well as lidocaine which is a local anesthetic. Dexamethasone made increased blood sugars you are diabetic You may experience soreness at the injection sites. You may also experienced some irritation of the nerves that were heated I'm recommending ice for 30 minutes every 2 hours as needed for the next 24-48 hours In addition he will be taking gabapentin 600 mg 3 times a day today only if this is not one of your usual medicines, [facility] [address]

## 2015-05-28 ENCOUNTER — Ambulatory Visit (INDEPENDENT_AMBULATORY_CARE_PROVIDER_SITE_OTHER): Payer: Medicare Other | Admitting: Neurology

## 2015-05-28 DIAGNOSIS — G44019 Episodic cluster headache, not intractable: Secondary | ICD-10-CM

## 2015-05-28 DIAGNOSIS — G4719 Other hypersomnia: Secondary | ICD-10-CM

## 2015-05-28 DIAGNOSIS — R0683 Snoring: Secondary | ICD-10-CM

## 2015-05-28 DIAGNOSIS — G4710 Hypersomnia, unspecified: Secondary | ICD-10-CM

## 2015-05-28 DIAGNOSIS — E662 Morbid (severe) obesity with alveolar hypoventilation: Secondary | ICD-10-CM

## 2015-05-29 NOTE — Sleep Study (Signed)
Please see the scanned sleep study interpretation located in the procedure tab in the chart view section.  

## 2015-05-30 ENCOUNTER — Telehealth: Payer: Self-pay | Admitting: Family Medicine

## 2015-05-30 DIAGNOSIS — Z720 Tobacco use: Secondary | ICD-10-CM

## 2015-05-30 NOTE — Telephone Encounter (Signed)
Pt called and needs a refill on his Chantix. jw

## 2015-05-31 MED ORDER — VARENICLINE TARTRATE 0.5 MG X 11 & 1 MG X 42 PO MISC: ORAL | Status: DC

## 2015-05-31 NOTE — Telephone Encounter (Signed)
Patient should be a former smoker, quit 2010 to 2014. I have never prescribed Chantix (Varenicline) for [employer]. I reviewed her chart and searched for chantix, this was prescribed and discontinued back in 2014.

## 2015-05-31 NOTE — Telephone Encounter (Signed)
Resumed smoking < 1 ppd, after quit back in 2014. Started 3 weeks ago. Plans to quit, previously quit with Chantix.  Plan: 1. Discussed quit plan over telephone - sent rx for Chantix pak start 0.5mg  daily x 3 days, then 0.5mg  BID x 4 days, then after 1 week quit date start 1mg  BID to complete rest of pack 2. Follow-up within 1 month as needed, consider pharmacy clinic dr. Valentina Lucks smoking cessation as needed

## 2015-05-31 NOTE — Telephone Encounter (Signed)
Pt called to ask for this because she's started back smoking

## 2015-05-31 NOTE — Assessment & Plan Note (Signed)
Resumed smoking < 1 ppd, after quit back in 2014. Started 3 weeks ago. Plans to quit, previously quit with Chantix.  Plan: 1. Discussed quit plan over telephone - sent rx for Chantix pak start 0.5mg  daily x 3 days, then 0.5mg  BID x 4 days, then after 1 week quit date start 1mg  BID to complete rest of pack 2. Follow-up within 1 month as needed, consider pharmacy clinic dr. Valentina Lucks smoking cessation as needed

## 2015-06-03 ENCOUNTER — Telehealth: Payer: Self-pay

## 2015-06-03 DIAGNOSIS — G4733 Obstructive sleep apnea (adult) (pediatric): Secondary | ICD-10-CM

## 2015-06-03 DIAGNOSIS — R0902 Hypoxemia: Secondary | ICD-10-CM

## 2015-06-03 NOTE — Telephone Encounter (Signed)
Called pt to give her sleep study results. Her roommate answered the phone and said that pt is sleeping. I asked roommate to have pt call me back at [phone] when she has a chance. I did not provide any information about the pt to the roommate.

## 2015-06-03 NOTE — Telephone Encounter (Signed)
Spoke to Wendy [name] and advised her that her study revealed that she was overly sedated with mild osa but significant hypoxemia. Because of the hypoxemia with the mild osa, cpap treatment is advised and Dr. Brett Fairy recommends coming in for an attended titration study. I advised tp that her sedatives and pain management needs to be reviewed by referring pain management physician. Wendy [name] reports that this is Dr. Parks Ranger, and she wants these results faxed to him. Will fax the results. Wendy [name] wishes to proceed with cpap titration study. I advised her that our office will call her to schedule the titration study. I advised Wendy [name] to avoid driving or operating hazardous machinery when sleepy and to lose weight, diet, and exercise.

## 2015-06-03 NOTE — Telephone Encounter (Signed)
Pt called returning Kristen's call.

## 2015-06-06 ENCOUNTER — Other Ambulatory Visit: Payer: Self-pay | Admitting: Family Medicine

## 2015-06-06 ENCOUNTER — Telehealth: Payer: Self-pay | Admitting: *Deleted

## 2015-06-06 DIAGNOSIS — F4001 Agoraphobia with panic disorder: Secondary | ICD-10-CM

## 2015-06-06 NOTE — Telephone Encounter (Signed)
Called [facility] and spoke to [place] and advised her to cancel Clonazepam refill that I called in earlier this AM and verbalized understanding and canceled RX. If pt calls back need to know if she got paper rx when she saw Dr. Parks Ranger on 05/27/15.  Jarrad Mclees, CMA

## 2015-06-06 NOTE — Telephone Encounter (Signed)
Wendy [name] at [place] and asked if they received Rx for Clonazepam and she stated they did not and gave verbal for RX on 05/27/15 by Dr. Jeronimo Norma. Called to inform pt that this was called in and LM stating this. If pt calls back please let her know that Rx for Clonazepam was called in and will ask MD covering PCP and PCP about sleeping medication. Jhoanna Heyde, CMA.

## 2015-06-06 NOTE — Telephone Encounter (Signed)
Pt is calling to request a refill on clonazePAM (KLONOPIN) 1 MG tablet and would like [facility] [address]. She would also like for her provider to contact her, as she would like to request sleeping medication, pt states that provider is aware of this ongoing issue. Thank you, Fonda Kinder, ASA

## 2015-06-06 NOTE — Telephone Encounter (Signed)
-----   Message from Mariel Aloe, MD sent at 06/06/2015 10:22 AM EDT ----- Lovenia Shuck, saw that you called in the Klonopin for Ms. Recendez. Dr. Parks Ranger actually signed the most recent prescription as a printed prescription and, from his office note, she should have received the physical copy upon leaving the office. Please call the pharmacy to cancel the refill authorization. If she has lost the prescription, she will need to bring this up with Dr. Parks Ranger. Thanks!

## 2015-06-06 NOTE — Telephone Encounter (Signed)
This was prescribed on 10/3 by Dr. Parks Ranger

## 2015-06-10 ENCOUNTER — Telehealth: Payer: Self-pay | Admitting: Family Medicine

## 2015-06-10 NOTE — Telephone Encounter (Signed)
Pt calling once more about the status of this request. She would like to speak to her provider about "some tests he is giving" her and she would like to discuss further with her provider. Sadie Reynolds, ASA

## 2015-06-10 NOTE — Telephone Encounter (Signed)
Patient calls, would like to speak to Dr. Parks Ranger about some "concerning issues" she is having. Would not go into details with me. Please call.

## 2015-06-11 NOTE — Telephone Encounter (Signed)
Pt is calling back and would like to know when the doctor will call her. jw

## 2015-06-11 NOTE — Telephone Encounter (Signed)
See telephone note dated 06/03/15 by Lester [place] (RN at [facility]), overall she reviewed the sleep study results with patient, finding mild osa in setting of over sedation likely from polypharmacy with sedating meds, also with significant hypoxemia, and she was advised to schedule a CPAP titration observed sleep study, and she will need to call and schedule this.  Called patient back, she had recent sleep study on 10/4 at [facility] by Dr. Brett Fairy (see above results), she reported confusion on this sleep study and wanted clarification. I relayed the above results, and stated that ultimately a lot of her symptoms including fatigue, frequent falls, and some memory loss could be related to very poor sleep, re-iterated oversedation with meds. Advised her to reduce her dose of Tramadol to 1 tablet 50mg  TID PRN (if severe breakthrough can take 2 tabs), and will recommend reducing Flexeril as well, especially at night. I advised patient to call [facility] back to discuss scheduling the next step attended CPAP titration sleep study, and stated that she needs to do this to proceed with CPAP.  Wendy [name], [place], PGY-3

## 2015-06-12 ENCOUNTER — Encounter: Payer: Self-pay | Admitting: Family Medicine

## 2015-06-12 DIAGNOSIS — G4733 Obstructive sleep apnea (adult) (pediatric): Secondary | ICD-10-CM | POA: Insufficient documentation

## 2015-06-17 ENCOUNTER — Telehealth: Payer: Self-pay

## 2015-06-17 DIAGNOSIS — K7469 Other cirrhosis of liver: Secondary | ICD-10-CM

## 2015-06-17 NOTE — Telephone Encounter (Signed)
Ok to refill xifaxan?  Please advise, I see in last office note (03/19/14) about a Dr Monica Martinez? Thank you.

## 2015-06-18 MED ORDER — RIFAXIMIN 550 MG PO TABS: 550.0000 mg | ORAL_TABLET | Freq: Two times a day (BID) | ORAL | Status: DC

## 2015-06-18 NOTE — Telephone Encounter (Signed)
Left message for patient to call me back.  Faxed xifaxan refill to [facility].

## 2015-06-18 NOTE — Telephone Encounter (Signed)
Informed patient of [place] date/time:  [facility] radiology on November 1st at 9:30AM, arrive 9:15AM, NPO 6 hours.  She was given there # in case she needs to r/s appointment.  She will tell Dr Monica Martinez it has been done and will ask him for future xifaxan refills.

## 2015-06-18 NOTE — Telephone Encounter (Signed)
Do the [place] and have her tell Dr. Monica Martinez it was done She does not need to see me but should ask Dr. Lemmie Evens for Xifaxan refill

## 2015-06-18 NOTE — Telephone Encounter (Signed)
Patient called back.  She has appointment with Dr Monica Martinez November 28th.  Just to clarify do you want [place] to do [place] or let Dr Charyl Bigger?

## 2015-06-18 NOTE — Telephone Encounter (Signed)
OK to refill Xifaxan x 3 months  Needs: 1) Abd [place] limited RUQ - f/u cirrhosis 2) Appt me Dec or Jan - unless she has one on books w/ [facility]

## 2015-06-20 ENCOUNTER — Telehealth: Payer: Self-pay | Admitting: Family Medicine

## 2015-06-20 NOTE — Telephone Encounter (Signed)
Pt called and needs the doctor to call her about her itching. jw

## 2015-06-21 ENCOUNTER — Ambulatory Visit (INDEPENDENT_AMBULATORY_CARE_PROVIDER_SITE_OTHER): Payer: Medicare Other | Admitting: Neurology

## 2015-06-21 DIAGNOSIS — R0902 Hypoxemia: Secondary | ICD-10-CM

## 2015-06-21 DIAGNOSIS — G4733 Obstructive sleep apnea (adult) (pediatric): Secondary | ICD-10-CM

## 2015-06-21 NOTE — Telephone Encounter (Signed)
Called patient back. She reports continued chronic generalized pruritus without rash. Same complaint last discussed on 05/27/15, she had been doing better on increased Hydroxyzine 50mg  BID PRN with good results but not full relief. Seems to be persistent still. She reviewed prior history years ago had been seen by "doctors at [facility]" including Dr. Hanley Seamen office, and had been given "several medicines for this" but they "couldn't figure it out", now seems to be worsening. She has not tried any topical medicines.  Advised her to continue Hydroxyzine, caution if feeling drowsy. Start OTC Benadryl anti-itch topical small amount in severe areas alternatively can try a OTC Hydrocortisone again small areas if Benadryl not helping. Additionally in future could consider topical Doxepin as possibility, alternatively given significant polypharmacy may consider referral to Dermatology.  She will follow-up with Dr. Monica Martinez at [place] soon as scheduled.  [name], [place], PGY-3

## 2015-06-25 ENCOUNTER — Telehealth: Payer: Self-pay | Admitting: Internal Medicine

## 2015-06-25 ENCOUNTER — Ambulatory Visit (HOSPITAL_COMMUNITY): Admission: RE | Admit: 2015-06-25 | Payer: Medicare Other | Source: Ambulatory Visit

## 2015-06-25 NOTE — Telephone Encounter (Signed)
A user error has taken place.

## 2015-06-26 ENCOUNTER — Other Ambulatory Visit: Payer: Self-pay | Admitting: Family Medicine

## 2015-06-26 DIAGNOSIS — Z720 Tobacco use: Secondary | ICD-10-CM

## 2015-06-27 MED ORDER — VARENICLINE TARTRATE 1 MG PO TABS: 1.0000 mg | ORAL_TABLET | Freq: Two times a day (BID) | ORAL | Status: DC

## 2015-06-27 NOTE — Telephone Encounter (Signed)
Received refill request for Chantix starter pack for smoking cessation. This was prescribed 05/31/15, patient had planned to set quit date. I called and spoke with Mrs. Yzaguirre, she reports that she has not fully quit yet, but is near quitting. Tolerating Chantix well, and has titrated accordingly up to 1mg  BID. Declined refill for another starter pack and sent in refill for Chantix 1mg  BID #60, 1 refill to pharmacy, advised her to pick a quit date within next 2 weeks and try to quit while on this medicine, if not helping in future can re-evaluate.  [name], [place], PGY-3

## 2015-06-28 ENCOUNTER — Ambulatory Visit (HOSPITAL_BASED_OUTPATIENT_CLINIC_OR_DEPARTMENT_OTHER): Payer: Medicare Other | Admitting: Physical Medicine & Rehabilitation

## 2015-06-28 ENCOUNTER — Encounter: Payer: Self-pay | Admitting: Physical Medicine & Rehabilitation

## 2015-06-28 ENCOUNTER — Encounter: Payer: Medicare Other | Attending: Physical Medicine & Rehabilitation

## 2015-06-28 VITALS — BP 117/79 | HR 74 | Resp 14

## 2015-06-28 DIAGNOSIS — G8929 Other chronic pain: Secondary | ICD-10-CM | POA: Diagnosis not present

## 2015-06-28 DIAGNOSIS — M545 Low back pain, unspecified: Secondary | ICD-10-CM | POA: Insufficient documentation

## 2015-06-28 DIAGNOSIS — G9389 Other specified disorders of brain: Secondary | ICD-10-CM | POA: Insufficient documentation

## 2015-06-28 DIAGNOSIS — I10 Essential (primary) hypertension: Secondary | ICD-10-CM | POA: Insufficient documentation

## 2015-06-28 DIAGNOSIS — K219 Gastro-esophageal reflux disease without esophagitis: Secondary | ICD-10-CM | POA: Diagnosis not present

## 2015-06-28 NOTE — Progress Notes (Signed)
54 year old female with history of traumatic brain injury returns today following a right L3 L4 L5 medial branch radiofrequency neurotomy performed 05/27/2015.Prior to her radiofrequency neurotomy she had lumbar medial branch blocks L3-L4 as well as L5 dorsal ramus injections performed08/29/2016 with a 50% relief of pain reported in her lumbar area. She states that she had about 1 or 2 weeks relief with the radiofrequency neurotomy. She does feel her left-sided low back pain is more noticeable than it was prior to the procedure however she states that her right-sided low back pain is about the same as it was prior to the procedure.  Gen. No acute distress Lumbar spine has reduced range of motion with flexion extension lateral bending and rotation. Lateral bending tends to be the most painful position for her. She has no evidence of spinal deformity. She has mild tenderness to palpation on the lumbosacral junction Skin shows no evidence of discoloration or lesions in the injection area.  Impression 1. Lumbar axial pain she has an equivocal response to the radiofrequency neurotomy. She does have increased left-sided pain as would be typical after relieving pain on the right side however she states her right-sided low back pain is about the same as preprocedure. We will therefore not perform the left-sided procedure. We will have the patient return in 5 months at which point I would imagine the right-sided procedure would be wearing off and see whether or not her right-sided pain increases once again. Discussed with patient agrees with plan

## 2015-06-28 NOTE — Progress Notes (Signed)
  PROCEDURE RECORD [facility]   Name: Wendy [name] DOB:1961-05-27 MRN: [id number]  Date:06/28/2015  Physician: Alysia Penna, MD    Nurse/CMA: Mancel Parsons  Allergies:  Allergies  Allergen Reactions  . Codeine Phosphate Itching  . Codeine Rash    Consent Signed: Yes.    Is patient diabetic? No.  CBG today?   Pregnant: No. LMP: No LMP recorded. Patient is postmenopausal. (age 54-55)  Anticoagulants: no Anti-inflammatory: no Antibiotics: no  Procedure: Left radiofrequency neurotomy  Position: Prone Start Time:   End Time:   Fluoro Time:   RN/CMA Daryel November    Time 11:20am     BP 117/79     Pulse 73     Respirations 14 14    O2 Sat 96     S/S 6 6    Pain Level 6/10      D/C home with friend Philomena Course), patient A & O X 3, D/C instructions reviewed, and sits independently.

## 2015-07-01 ENCOUNTER — Telehealth: Payer: Self-pay

## 2015-07-01 DIAGNOSIS — G4733 Obstructive sleep apnea (adult) (pediatric): Secondary | ICD-10-CM

## 2015-07-01 DIAGNOSIS — G4736 Sleep related hypoventilation in conditions classified elsewhere: Secondary | ICD-10-CM

## 2015-07-01 NOTE — Telephone Encounter (Signed)
Called pt with results of sleep study. No answer, left a message asking her to call me back.

## 2015-07-01 NOTE — Telephone Encounter (Signed)
Patient returned call

## 2015-07-01 NOTE — Telephone Encounter (Signed)
Spoke to pt regarding her sleep study results. I advised pt that Dr. Brett Fairy reviewed her cpap titration and advises pt to start a cpap. Pt is willing to proceed. I also advised pt that Dr. Brett Fairy wants to order an ONO on pt while on pt. Pt verbalized understanding. Pt is willing to proceed with cpap. I advised her that I would send her order to [facility]. A f/u appt was made for 09/04/2015 at 8:30. Pt verbalized understanding to arrive 15 minutes early and bring cpap.

## 2015-07-02 ENCOUNTER — Telehealth: Payer: Self-pay | Admitting: Family Medicine

## 2015-07-02 ENCOUNTER — Ambulatory Visit (HOSPITAL_COMMUNITY): Admission: RE | Admit: 2015-07-02 | Payer: Medicare Other | Source: Ambulatory Visit

## 2015-07-02 NOTE — Telephone Encounter (Signed)
Would like to talk to dr k about her sleeping test

## 2015-07-03 ENCOUNTER — Ambulatory Visit (HOSPITAL_COMMUNITY): Admission: RE | Admit: 2015-07-03 | Payer: Medicare Other | Source: Ambulatory Visit

## 2015-07-04 NOTE — Telephone Encounter (Signed)
Called patient back. She wanted to review results of her sleep study, and was also asking why did we do the sleep study in the first place. I reviewed her presenting symptoms again (frequent falls, fatigue, memory loss), she was updated on this treatment plan. I reviewed her results. Also of note she was called by [facility]) about the sleep study results. She was prescribed a CPAP (after the titration) and should be assisted by [facility]. I reminded her of the follow-up with Dr Brett Fairy on 09/04/15 at 8:30am and to bring CPAP. She has not received CPAP machine yet, still waiting on this. I reassured her that she only wears it at night, and not during the day, and may take some time to get used to.  She will schedule follow-up with min in 1-2 months as needed.  [name], [place], PGY-3

## 2015-07-04 NOTE — Telephone Encounter (Signed)
Pt called to see when the doctor would be calling her. jw

## 2015-07-11 ENCOUNTER — Ambulatory Visit (HOSPITAL_COMMUNITY)
Admission: RE | Admit: 2015-07-11 | Discharge: 2015-07-11 | Disposition: A | Discharge status: Home / Self Care | Payer: Medicare Other | Source: Ambulatory Visit | Attending: Internal Medicine | Admitting: Internal Medicine

## 2015-07-11 DIAGNOSIS — K8020 Calculus of gallbladder without cholecystitis without obstruction: Secondary | ICD-10-CM | POA: Diagnosis not present

## 2015-07-11 DIAGNOSIS — K7290 Hepatic failure, unspecified without coma: Secondary | ICD-10-CM | POA: Diagnosis not present

## 2015-07-11 DIAGNOSIS — B182 Chronic viral hepatitis C: Secondary | ICD-10-CM | POA: Insufficient documentation

## 2015-07-11 DIAGNOSIS — K7460 Unspecified cirrhosis of liver: Secondary | ICD-10-CM | POA: Diagnosis not present

## 2015-07-11 DIAGNOSIS — K7469 Other cirrhosis of liver: Secondary | ICD-10-CM

## 2015-07-11 NOTE — Progress Notes (Signed)
Quick Note:  Cirrhosis as we know but no masses/tumors Has gallstones which we have known Repeat same exam n 6 months re: cirrhosis screen for tumors ______

## 2015-07-16 ENCOUNTER — Other Ambulatory Visit: Payer: Self-pay | Admitting: Family Medicine

## 2015-07-16 DIAGNOSIS — M47816 Spondylosis without myelopathy or radiculopathy, lumbar region: Secondary | ICD-10-CM

## 2015-07-17 ENCOUNTER — Other Ambulatory Visit: Payer: Self-pay | Admitting: *Deleted

## 2015-07-17 DIAGNOSIS — F4001 Agoraphobia with panic disorder: Secondary | ICD-10-CM

## 2015-07-22 MED ORDER — CLONAZEPAM 1 MG PO TABS: 1.0000 mg | ORAL_TABLET | Freq: Two times a day (BID) | ORAL | Status: DC | PRN

## 2015-07-23 ENCOUNTER — Other Ambulatory Visit: Payer: Self-pay | Admitting: *Deleted

## 2015-07-23 ENCOUNTER — Telehealth: Payer: Self-pay | Admitting: Family Medicine

## 2015-07-23 DIAGNOSIS — K219 Gastro-esophageal reflux disease without esophagitis: Secondary | ICD-10-CM

## 2015-07-23 DIAGNOSIS — L299 Pruritus, unspecified: Secondary | ICD-10-CM

## 2015-07-23 MED ORDER — OMEPRAZOLE 20 MG PO CPDR: 20.0000 mg | DELAYED_RELEASE_CAPSULE | Freq: Every day | ORAL | Status: DC

## 2015-07-23 NOTE — Telephone Encounter (Signed)
Would like to talk to dr Raliegh Ip   " i will just tell him"

## 2015-07-24 ENCOUNTER — Telehealth (HOSPITAL_COMMUNITY): Payer: Self-pay | Admitting: *Deleted

## 2015-07-24 MED ORDER — DOXEPIN HCL 5 % EX CREA: 1.0000 "application " | TOPICAL_CREAM | Freq: Three times a day (TID) | CUTANEOUS | Status: DC | PRN

## 2015-07-24 NOTE — Telephone Encounter (Signed)
Contacted Harriet at [facility] to confirm contact # given to patient for Dr De Nurse [facility], Psychiatry) [phone]. I also had called their office and asked that they contact the patient directly for follow-up. [place] to relay the information back to patient.  [name], [place], PGY-3

## 2015-07-24 NOTE — Telephone Encounter (Signed)
Called patient back. Spoke with Wendy [name]. She reports several concerns and questions.  1. Itching - History of generalized pruritus of unclear etiology, possibly thought related to cirrhosis. She reports taking Hydroxyzine 25mg  1-2 tabs BID, often takes 2 tabs per dose, with some relief. Still having symptoms of itching, mostly localized to lower back now. I advised her that given no significant resolution on high dose hydroxyzine, she can reduce Hydroxyzine to 25mg  1 tab BID max dose. Start new rx Doxepin 5% ex cream apply up to 3 times daily PRN to affected itching area up to 2 weeks then stop. Caution with anti-cholinergic side-effects, but if limited area and avoid wide-spread use should have reduced systemic side-effects.  2. Depression - Known history of MDD, co-morbid anxiety. Previously followed [facility] (Dr. De Nurse) last see 01/2015. At that time she was started on Lexapro 5mg  daily but patient never filled the rx. She was confused about this and we had discussed it, also I had re-sent this rx electronically and it still seems that she has not started it. I asked her to call Dr. Evalina Field psychiatry office and schedule a follow-up, bring her medicines to the office and discuss her Depression.  3. CPAP - She has completed the CPAP titration sleep study recently, has her CPAP and supplies. Trying to wear it at night, but occasionally having difficulty and has not been able to regularly sleep through the night with it. Encouraged her to continue to try to adhere to CPAP, will take some time but important.  4. Muscle Jerks - Patient unable to accurately describe this new symptom, unclear onset but may be getting worse some generalized "muscle jerks", does not seem to be related to sleep, which would be more normal. Also admits to ?tremor. Suspect could be related to sedating medications. Reduced dose of Hydoxyzine to 1 tab BID, can taper this farther down if Doxepin topical improves  itching. We have discussed reducing her meds several times.  She may benefit from a [facility] visit, bring all meds and do a recent accurate med rec.  Wendy [name], [place], PGY-3

## 2015-07-24 NOTE — Telephone Encounter (Signed)
The phone number dr Raliegh Ip gave her is missing a digit

## 2015-07-25 ENCOUNTER — Encounter (HOSPITAL_COMMUNITY): Payer: Self-pay | Admitting: Psychiatry

## 2015-07-25 ENCOUNTER — Ambulatory Visit (INDEPENDENT_AMBULATORY_CARE_PROVIDER_SITE_OTHER): Payer: Medicare Other | Admitting: Psychiatry

## 2015-07-25 VITALS — BP 122/64 | HR 79 | Ht 62.0 in | Wt 214.0 lb

## 2015-07-25 DIAGNOSIS — F411 Generalized anxiety disorder: Secondary | ICD-10-CM | POA: Diagnosis not present

## 2015-07-25 DIAGNOSIS — F0630 Mood disorder due to known physiological condition, unspecified: Secondary | ICD-10-CM | POA: Diagnosis not present

## 2015-07-25 DIAGNOSIS — F4001 Agoraphobia with panic disorder: Secondary | ICD-10-CM

## 2015-07-25 DIAGNOSIS — F331 Major depressive disorder, recurrent, moderate: Secondary | ICD-10-CM

## 2015-07-25 MED ORDER — ESCITALOPRAM OXALATE 10 MG PO TABS: 10.0000 mg | ORAL_TABLET | Freq: Every day | ORAL | Status: DC

## 2015-07-25 NOTE — Patient Instructions (Signed)
Consider to lower dose of klonopine slowly considering she is having memory or cognitive issues Consider to slowly lower dose of seroquel . Will increase lexapro today to 10mg .

## 2015-07-25 NOTE — Progress Notes (Signed)
Patient ID: Wendy [name], female   DOB: 09/18/1960, 54 y.o.   MRN: [id number]  [facility] Follow up visit  Wendy [name] [id number] 54 y.o.  07/25/2015 3:44 PM  Chief Complaint:  Depression, follow up  History of Present Illness:   Patient Presents for follow-up and medication management for panic disorder, generalized anxiety disorder. Major depressive disorder. 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  Wendy [name] is 54 years old currently single Caucasian female initially referred by Dr. Sheppard Coil for depression. She has multiple medical conditions including history of broken hip before 4 years ago that exacerbated her symptoms of depression. She is also being managed for anxiety, panic symptoms including agoraphobia. She has history of hepatitis C which is now cured. She has history of fall and multiple medical conditions which she follows up with her primary care physician.  Last visit she was again advised to be regular taking Lexapro. She has noticed only some improvement in depression but remained concerned about her difficulty in remembering and forgetfulness She has has recent increase memory deficits and is followed by other primary and other providers. With possible memory or cognitive deficits related to traumatic brain injury. Other factors that may be contributing to it may be low thyroid, depression, traumatic brain injury, being on multiple medications and having medical conditions. Aggravating factors; physical health, broken hip 4 years ago. 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  She feels lonely. Multiple medical issues. Her husband died 70 years ago on Valentine's Day. She is provided with the holidays coming and anniversaries of deaths. Modifying factors; she attends the [employer] she tries to go for a walk.  Severity of depression; 5  out of 10. 10 being no depression Duration; more  than 10:15 years. Worse in the last 4 years Medical complexity; reviewed records. She does have hypothyroidism. History of hepatitis C. Hip surgeries. All these conditions can exacerbate her depression.  There is no associated symptoms of psychosis, paranoia. Does not endorse suicidal or homicidal thoughts. There is no history of physical or sexual trauma.    Medical History; Past Medical History  Diagnosis Date  . Hepatic cirrhosis due to chronic hepatitis C infection ([place])     ETOH causative as well  . Hiatal hernia   . Esophageal stricture     esophageal dysmotility and chronic dysphagia as well  . 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 History of alcohol abuse   . Anxiety and depression   . History of hip fracture   . Allergic rhinitis   . HTN (hypertension)   . Blood transfusion   . Cataract     MILD  . GERD (gastroesophageal reflux disease)   . Osteoporosis   . Ulcer     2007  . Arthritis   . Clotting disorder ([place])     prolonged clotting time due to liver disease  . Paraesophageal hernia   . Compression fracture 09/21/2013  . Hepatic encephalopathy syndrome (HCC)     Allergies: Allergies  Allergen Reactions  . Codeine Phosphate Itching  . Codeine Rash    Medications: Outpatient Encounter Prescriptions as of 07/25/2015  Medication Sig  . alendronate (FOSAMAX) 70 MG tablet Take 70 mg by mouth.  . bisacodyl ([employer] GENTLE LAXATIVE) 5 MG EC tablet TAKE 1 TABLET (5 MG TOTAL) BY MOUTH DAILY.  . carvedilol (COREG) 12.5 MG tablet Take 1 tablet (12.5 mg total)  by mouth 2 (two) times daily with a meal.  . Cholecalciferol (VITAMIN D3) 2000 UNITS capsule Take 2,000 Units by mouth daily.  . clonazePAM (KLONOPIN) 1 MG tablet Take 1 tablet (1 mg total) by mouth 2 (two) times daily as needed for anxiety.  . cyclobenzaprine (FLEXERIL) 10 MG tablet TAKE 1/2 TABLET 3 TIMES A DAY AS NEEDED FOR MUSCLE SPASM  . cycloSPORINE (RESTASIS) 0.05 % ophthalmic emulsion  Place 1 drop into both eyes 2 (two) times daily as needed (For dry eyes.).  Marland Kitchen docusate sodium (COLACE) 100 MG capsule TAKE ONE CAPSULE BY MOUTH TWICE A DAY  . Doxepin HCl 5 % CREA Apply 1 application topically 3 (three) times daily as needed (affected area of itching). Use up to 2 weeks at a time then stop.  Marland Kitchen escitalopram (LEXAPRO) 10 MG tablet Take 1 tablet (10 mg total) by mouth daily.  . furosemide (LASIX) 40 MG tablet TAKE 1/2 TABLET EVERY DAY  . gabapentin (NEURONTIN) 600 MG tablet Take 1 tablet (600 mg total) by mouth 3 (three) times daily.  Marland Kitchen lactulose (CHRONULAC) 10 GM/15ML solution Take 45 mLs (30 g total) by mouth 4 (four) times daily as needed (for constipation).  Marland Kitchen levothyroxine (SYNTHROID, LEVOTHROID) 75 MCG tablet Take 1 tablet (75 mcg total) by mouth daily before breakfast.  . loratadine (CLARITIN) 10 MG tablet Take 1 tablet (10 mg total) by mouth daily.  . Multiple Vitamins-Minerals (CENTRUM SILVER ULTRA WOMENS PO) Take by mouth.  . Olopatadine HCl 0.2 % SOLN Place 1 drop into both eyes every morning.   Marland Kitchen omeprazole (PRILOSEC) 20 MG capsule Take 1 capsule (20 mg total) by mouth daily.  . polyethylene glycol (MIRALAX) packet Take 17 g by mouth 2 (two) times daily.  . QUEtiapine (SEROQUEL) 200 MG tablet TAKE 1 TABLET BY MOUTH AT BEDTIME  . rifaximin (XIFAXAN) 550 MG TABS tablet Take 1 tablet (550 mg total) by mouth 2 (two) times daily.  Marland Kitchen spironolactone (ALDACTONE) 100 MG tablet TAKE 1 TABLET EVERY DAY  . traMADol (ULTRAM) 50 MG tablet Take 1-2 tablets (50-100 mg total) by mouth every 8 (eight) hours as needed (For pain.).  Marland Kitchen traZODone (DESYREL) 50 MG tablet TAKE 1/2-1 TABLET BY MOUTH AT BEDTIME AS NEEDED FOR SLEEP  . varenicline (CHANTIX CONTINUING MONTH PAK) 1 MG tablet Take 1 tablet (1 mg total) by mouth 2 (two) times daily.  . [DISCONTINUED] escitalopram (LEXAPRO) 10 MG tablet Take 0.5 tablets (5 mg total) by mouth daily.  . hydrOXYzine (ATARAX/VISTARIL) 25 MG tablet Take 1-2  tablets (25-50 mg total) by mouth 2 (two) times daily as needed for itching. (Patient not taking: Reported on 07/25/2015)   No facility-administered encounter medications on file as of 07/25/2015.    Family History; Family History  Problem Relation Age of Onset  . Heart disease Mother   . Anxiety disorder Mother   . Colon cancer Neg Hx   . Other Daughter     chromosome abnormalit  . Other Daughter     micropthalmia       Labs:  No results found for this or any previous visit (from the past 2160 hour(s)).     Musculoskeletal: Strength & Muscle Tone: within normal limits Gait & Station: broad based Patient leans: front when standing  Mental Status Examination;   Psychiatric Specialty Exam: Physical Exam  HENT:  Head: Normocephalic.  Skin: She is not diaphoretic.    Review of Systems  Constitutional: Negative for fever.  Respiratory: Negative for cough.  Musculoskeletal: Positive for back pain.  Skin: Negative for rash.  Neurological: Negative for tremors and headaches.  Psychiatric/Behavioral: Positive for depression. Negative for suicidal ideas and substance abuse. The patient has insomnia.     Blood pressure 122/64, pulse 79, height 5\' 2"  (1.575 m), weight 214 lb (97.07 kg), SpO2 95 %.Body mass index is 39.13 kg/(m^2).  General Appearance: Casual  Eye Contact::  Fair  Speech:  Slow  Volume:  Decreased  Mood: dysphoric   Affect:  Congruent  Thought Process:  Coherent  Orientation:  Full (Time, Place, and Person)  Thought Content:  Rumination  Suicidal Thoughts:  No  Homicidal Thoughts:  No  Memory:  Immediate;   Fair Recent;   Fair  Judgement:  Fair  Insight:  Shallow  Psychomotor Activity:  Decreased  Concentration:  Fair  Recall:  Fair  Akathisia:  Negative  Handed:  Right  AIMS (if indicated):     Assets:  Desire for Improvement Vocational/Educational  Sleep:        Assessment: Axis I: Maj. depressive disorder recurrent moderate.  Generalized anxiety disorder. Panic disorder with agoraphobia. 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 Hepatic cirrhosis due to chronic hepatitis C infection ([place])     ETOH causative as well  . Hiatal hernia   . Esophageal stricture     esophageal dysmotility and chronic dysphagia as well  . 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 History of alcohol abuse   . Anxiety and depression   . History of hip fracture   . Allergic rhinitis   . HTN (hypertension)   . Blood transfusion   . Cataract     MILD  . GERD (gastroesophageal reflux disease)   . Osteoporosis   . Ulcer     2007  . Arthritis   . Clotting disorder ([place])     prolonged clotting time due to liver disease  . Paraesophageal hernia   . Compression fracture 09/21/2013  . Hepatic encephalopathy syndrome (H[facility])     Axis IV: Psychosocial. Multiple medical. Loneliness  Treatment Plan and Summary:  Depression: increase lexapro to 10mg  a day  Panic and Anxiety" recommend slowly to cut down the Seroquel from 200-100 mg. Also recommend to cut down the Klonopin considering her cognitive concerns.  She understands and will talk to her primary care. Medical complexity: Follow closely with other providers in regarding to her medical condition including hypothyroidism and also possibly to rule out sleep apnea  Pertinent Labs and Relevant Prior Notes reviewed. Medication Side effects, benefits and risks reviewed/discussed with Patient. Time given for patient to respond and asks questions regarding the Diagnosis and Medications. Safety concerns and to report to ER if suicidal or call 911. Relevant Medications refilled or called in to pharmacy. Discussed weight maintenance and Sleep Hygiene. Follow up with Primary care provider in regards to Medical conditions. Recommend  compliance with medications and follow up office appointments. Discussed to avail opportunity to consider or/and continue Individual therapy with Counselor. Greater than 50% of time was spend in counseling and coordination of care with the patient.  Schedule for Follow up visit in 8  weeks or call in earlier as necessary.   Merian Capron, MD 07/25/2015

## 2015-07-29 ENCOUNTER — Telehealth: Payer: Self-pay

## 2015-07-29 NOTE — Telephone Encounter (Signed)
Called pt to tell her that her ONO on CPAP came back normal, and no supplemental O2 is needed. Left a message asking that the pt call me back.

## 2015-07-29 NOTE — Telephone Encounter (Signed)
Spoke to pt and advised her that her ONO on cpap came back normal and no supplemental oxygen is needed. Pt verbalized understanding. Pt was reminded of her appt on 1/11 at 8:30 and to bring cpap.

## 2015-07-29 NOTE — Telephone Encounter (Signed)
Patient returned call

## 2015-08-04 ENCOUNTER — Other Ambulatory Visit: Payer: Self-pay | Admitting: Family Medicine

## 2015-08-04 DIAGNOSIS — K5900 Constipation, unspecified: Secondary | ICD-10-CM

## 2015-08-05 ENCOUNTER — Telehealth: Payer: Self-pay | Admitting: Family Medicine

## 2015-08-05 NOTE — Telephone Encounter (Signed)
Pt called and would like to speak to Dr. Parks Ranger about the information the other doctor was sending him. Blima Rich

## 2015-08-06 NOTE — Telephone Encounter (Addendum)
Attempted to call Mikia back to answer her questions and address her concerns. She did not answer at 1207 on 08/06/15, I did leave a VM stating that if she still needed assistance she may call back and I will try to call her back sometime later today or tomorrow.  Attempted to call back at 1421, spoke with roommate. Patient is currently asleep. I advised that I will try to call back later today or tomorrow, and was calling to return her message.  It seems she has concerns about what her other doctors were telling her, in the past she has often needed me to help clarify their recommendations and reiterate what they say. From her recent telephone encounters, I see the following:  Last visit with [facility] Dr De Nurse 07/25/15 1. For her Depression - Lexapro was increased from 5mg  to 10mg  daily 2. For her Anxiety - They had discussed slowly tapering down on her Seroquel from 200 to 100mg  nightly, also discussed reducing Klonopin for anxiety.  Last contact with [facility] on 07/29/15 by Lester Humeston RN - Telephone update stating that she does NOT need supplemental oxygen in her CPAP machine overnight. - She was reminded of her upcoming appointment on 09/03/14 at 8;30am and to bring her CPAP machine to the office. ----------------  UPDATE 08/07/15 - I spoke with [name] today, and she stated that reason she had initially called was to review some recommendations from Dr De Nurse (see above). She stated that she just received new Lexapro 10mg  and will start this. She was advised to discuss with me tapering her Seroquel and Klonopin. I advised her that I did not want to make too many abrupt changes all at once. I recommended that she start the new Lexapro for at least 2 to 4 weeks, then we would discuss tapering down Seroquel first to 100mg  nightly, followed by gradual Klonopin taper, likely BID-daily alternating dose for 2 weeks, then daily.  She was aware of updates from  [facility] medicine as well, and will follow-up as scheduled.  [name], [place], PGY-3

## 2015-08-07 ENCOUNTER — Telehealth: Payer: Self-pay | Admitting: Family Medicine

## 2015-08-07 NOTE — Telephone Encounter (Signed)
Would like for dr Raliegh Ip to check on parkinsons disease for pt

## 2015-08-08 NOTE — Telephone Encounter (Signed)
Patient spoke with Wendy [name] yesterday about this same concern. I have already relayed my response to her.  I will attempt to call her later once available to do so.

## 2015-08-09 NOTE — Telephone Encounter (Signed)
Called patient back, 12/16. Unable to reach patient. LVM stating that I have considered the possibility of parkinson's disease, and I think that this is a good thought. I would like to know more information as far as to her specific concern with parkinson's disease. I advised her that she may contact her Neurologist Dr Jaynee Eagles to discuss this at her next office visit.  Told her in the voicemail that I am coming off a call night, and will not be available by phone calls today and will return to office Monday 12/19.  [name], [place], PGY-3

## 2015-08-12 ENCOUNTER — Telehealth: Payer: Self-pay | Admitting: Neurology

## 2015-08-12 NOTE — Telephone Encounter (Signed)
Patient is calling and states she has increased falling and jerking and both she and her PCP think she should come on in to be seen.  There are only 15 minute time slots available in the next few days.  Can she be worked in.  Please call her.

## 2015-08-12 NOTE — Telephone Encounter (Signed)
Dr Jaynee Eagles- pt already has appt scheduled at 930am on 12/21. Thank you

## 2015-08-12 NOTE — Telephone Encounter (Signed)
UPDATE 08/12/15 - Called patient back and spoke with Wendy [name]. She reports that she did receive my message last week, attempted to call Dr Jaynee Eagles but couldn't get through. She states that her friend was concerned that she may have parkinson's and she wanted to discuss this in more detail. I looked into her scheduled apts and it seems she does not have any follow-up with Dr Jaynee Eagles,  Last seen 03/21/15. I advised her to call Dr Cathren Laine office at [facility] [phone] to schedule a follow-up appointment also to discuss her concerns of possible parkinson's.  [name], [place], PGY-3

## 2015-08-12 NOTE — Telephone Encounter (Signed)
Called and spoke to pt. Per Dr Jaynee Eagles, okay to use EMG slot on Wednesday if needed. Offered 730 tomorrow but pt declined. Scheduled f/u 12/21 at 930am for 9am checkin. Pt verbalized understanding.

## 2015-08-12 NOTE — Telephone Encounter (Signed)
Thanks Dr. Parks Ranger. I will ask Terrence Dupont to call patient and schedule a follow up with me. i agree with your assessment,  I did not see any signs of PD on her past exams. But I will see her again and evaluate her thank you!  Terrence Dupont, can you schedule a follow up for Ms. Rout please? Thanks.

## 2015-08-14 ENCOUNTER — Encounter: Payer: Self-pay | Admitting: Neurology

## 2015-08-14 ENCOUNTER — Ambulatory Visit (INDEPENDENT_AMBULATORY_CARE_PROVIDER_SITE_OTHER): Payer: Medicare Other | Admitting: Neurology

## 2015-08-14 ENCOUNTER — Other Ambulatory Visit: Payer: Self-pay | Admitting: Family Medicine

## 2015-08-14 VITALS — BP 123/79 | HR 72 | Ht 62.0 in | Wt 210.2 lb

## 2015-08-14 DIAGNOSIS — R253 Fasciculation: Secondary | ICD-10-CM

## 2015-08-14 DIAGNOSIS — W19XXXA Unspecified fall, initial encounter: Secondary | ICD-10-CM

## 2015-08-14 DIAGNOSIS — L299 Pruritus, unspecified: Secondary | ICD-10-CM

## 2015-08-14 NOTE — Patient Instructions (Signed)
Remember to drink plenty of fluid, eat healthy meals and do not skip any meals. Try to eat protein with a every meal and eat a healthy snack such as fruit or nuts in between meals. Try to keep a regular sleep-wake schedule and try to exercise daily, particularly in the form of walking, 20-30 minutes a day, if you can.   As far as diagnostic testing: EEG, physical therapy  Our phone number is [phone]. We also have an after hours call service for urgent matters and there is a physician on-call for urgent questions. For any emergencies you know to call 911 or go to the nearest emergency room
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
## 2015-08-27 ENCOUNTER — Ambulatory Visit: Payer: Self-pay | Admitting: Internal Medicine

## 2015-08-28 ENCOUNTER — Other Ambulatory Visit: Payer: Self-pay

## 2015-08-28 ENCOUNTER — Other Ambulatory Visit: Payer: Self-pay | Admitting: Family Medicine

## 2015-08-28 DIAGNOSIS — F331 Major depressive disorder, recurrent, moderate: Secondary | ICD-10-CM

## 2015-08-29 ENCOUNTER — Encounter: Payer: Self-pay | Admitting: Neurology

## 2015-09-02 ENCOUNTER — Ambulatory Visit: Payer: Medicare Other | Attending: Neurology | Admitting: Rehabilitation

## 2015-09-02 DIAGNOSIS — M623 Immobility syndrome (paraplegic): Secondary | ICD-10-CM | POA: Insufficient documentation

## 2015-09-02 DIAGNOSIS — R2681 Unsteadiness on feet: Secondary | ICD-10-CM | POA: Insufficient documentation

## 2015-09-02 DIAGNOSIS — R262 Difficulty in walking, not elsewhere classified: Secondary | ICD-10-CM | POA: Insufficient documentation

## 2015-09-02 DIAGNOSIS — R279 Unspecified lack of coordination: Secondary | ICD-10-CM | POA: Insufficient documentation

## 2015-09-02 DIAGNOSIS — R2991 Unspecified symptoms and signs involving the musculoskeletal system: Secondary | ICD-10-CM | POA: Insufficient documentation

## 2015-09-02 DIAGNOSIS — R531 Weakness: Secondary | ICD-10-CM | POA: Insufficient documentation

## 2015-09-02 DIAGNOSIS — R269 Unspecified abnormalities of gait and mobility: Secondary | ICD-10-CM | POA: Insufficient documentation

## 2015-09-02 DIAGNOSIS — R29818 Other symptoms and signs involving the nervous system: Secondary | ICD-10-CM | POA: Insufficient documentation

## 2015-09-03 ENCOUNTER — Other Ambulatory Visit: Payer: Self-pay | Admitting: *Deleted

## 2015-09-03 DIAGNOSIS — M549 Dorsalgia, unspecified: Principal | ICD-10-CM

## 2015-09-03 DIAGNOSIS — G8929 Other chronic pain: Secondary | ICD-10-CM

## 2015-09-03 MED ORDER — TRAMADOL HCL 50 MG PO TABS: 50.0000 mg | ORAL_TABLET | Freq: Three times a day (TID) | ORAL | Status: DC | PRN

## 2015-09-04 ENCOUNTER — Ambulatory Visit: Payer: Self-pay | Admitting: Neurology

## 2015-09-05 ENCOUNTER — Encounter: Payer: Self-pay | Admitting: Neurology

## 2015-09-10 ENCOUNTER — Ambulatory Visit: Payer: Medicare Other | Admitting: Physical Therapy

## 2015-09-10 ENCOUNTER — Encounter: Payer: Self-pay | Admitting: Physical Therapy

## 2015-09-10 DIAGNOSIS — R531 Weakness: Secondary | ICD-10-CM

## 2015-09-10 DIAGNOSIS — R269 Unspecified abnormalities of gait and mobility: Secondary | ICD-10-CM | POA: Diagnosis not present

## 2015-09-10 DIAGNOSIS — R2991 Unspecified symptoms and signs involving the musculoskeletal system: Secondary | ICD-10-CM

## 2015-09-10 DIAGNOSIS — M2560 Stiffness of unspecified joint, not elsewhere classified: Secondary | ICD-10-CM

## 2015-09-10 DIAGNOSIS — R262 Difficulty in walking, not elsewhere classified: Secondary | ICD-10-CM | POA: Diagnosis not present

## 2015-09-10 DIAGNOSIS — R2689 Other abnormalities of gait and mobility: Secondary | ICD-10-CM

## 2015-09-10 DIAGNOSIS — R2681 Unsteadiness on feet: Secondary | ICD-10-CM | POA: Diagnosis not present

## 2015-09-10 DIAGNOSIS — R29818 Other symptoms and signs involving the nervous system: Secondary | ICD-10-CM | POA: Diagnosis not present

## 2015-09-10 DIAGNOSIS — R279 Unspecified lack of coordination: Secondary | ICD-10-CM | POA: Diagnosis not present

## 2015-09-10 DIAGNOSIS — Z7409 Other reduced mobility: Secondary | ICD-10-CM

## 2015-09-10 DIAGNOSIS — M623 Immobility syndrome (paraplegic): Secondary | ICD-10-CM | POA: Diagnosis not present

## 2015-09-11 NOTE — Therapy (Signed)
[facility] [address] [place], [place], [address] Phone: [phone]   Fax:  [phone]  Physical Therapy Evaluation  Patient Details  Name: Wendy [name] MRN: [id number] Date of Birth: 08-12-1961 Referring Provider: Sarina Ill, MD  Encounter Date: 09/10/2015      PT End of Session - 09/10/15 1048    Visit Number 1   Number of Visits 18   Date for PT Re-Evaluation 11/08/15   Authorization Type Medicare G-Code & progress report every 10 visits   PT Start Time 1015   PT Stop Time 1048   PT Time Calculation (min) 33 min   Equipment Utilized During Treatment Gait belt   Activity Tolerance Patient limited by fatigue   Behavior During Therapy Anxious;WFL for tasks assessed/performed      Past Medical History  Diagnosis Date  . Hepatic cirrhosis due to chronic hepatitis C infection ([place])     ETOH causative as well  . Hiatal hernia   . Esophageal stricture     esophageal dysmotility and chronic dysphagia as well  . 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 History of alcohol abuse   . Anxiety and depression   . History of hip fracture   . Allergic rhinitis   . HTN (hypertension)   . Blood transfusion   . Cataract     MILD  . GERD (gastroesophageal reflux disease)   . Osteoporosis   . Ulcer     2007  . Arthritis   . Clotting disorder ([place])     prolonged clotting time due to liver disease  . Paraesophageal hernia   . Compression fracture 09/21/2013  . Hepatic encephalopathy syndrome [facility])     Past Surgical History  Procedure Laterality Date  . Orif hip fracture  2010    right x2  . Upper gastrointestinal endoscopy  08/05/2007    esophageal ring, hiatal hernia, portal gastropathy  . Splenectomy      age 88  . Burr hole for subdural hematoma  2004  . Upper gastrointestinal endoscopy  10/14/2011  . Colonoscopy  08/2012    moderatel left colon tics  . Kyphoplasty Bilateral  09/21/2013    Procedure: T12 - L1 KYPHOPLASTY;  Surgeon: Melina Schools, MD;  Location: [facility];  Service: Orthopedics;  Laterality: Bilateral;  . Esophagogastroduodenoscopy (egd) with propofol N/A 01/26/2014    Procedure: ESOPHAGOGASTRODUODENOSCOPY (EGD) WITH PROPOFOL;  Surgeon: Lafayette Dragon, MD;  [facility] END[facility]COPY;  Service: Endoscopy;  Laterality: N/A;    There were no vitals filed for this visit.  Visit Diagnosis:  Abnormality of gait  Weakness generalized  Stiffness due to immobility  Unsteadiness  Balance problems  Impaired transfers      Subjective Assessment - 09/10/15 1020    Subjective This 55yo female with history of TBI in 2004, Rt hip fx with ORIF 2010, compression fracture, osteoporosis, HTN, HepC, arthritis & hepatic encephalopathy. She has increased incidence of falls so was referred to PT for evaluation.    Patient Stated Goals She wants to walk better & get off floor   Currently in Pain? No/denies            [facility] PT Assessment - 09/10/15 1015    Assessment   Medical Diagnosis Falls, Gait Abnormality   Referring Provider Sarina Ill, MD   Onset Date/Surgical Date 09/04/15  MD appt   Hand Dominance Right   Precautions   Precautions Fall;Other (comment)  osteoporosis  Restrictions   Weight Bearing Restrictions No   Balance Screen   Has the patient fallen in the past 6 months Yes   How many times? 2-3 times /wk   Has the patient had a decrease in activity level because of a fear of falling?  Yes   Is the patient reluctant to leave their home because of a fear of falling?  Yes   [place] residence   Living Arrangements Other (Comment)  2 renters   Type of [place] Access Other (comment)  paved path with slight incline   Home Layout One level   [place] - 2 wheels;Cane - single point;Bedside commode;Grab bars - tub/shower   Prior Function   Level of Independence  Independent;Independent with gait   Vocation On disability   Cognition   Overall Cognitive Status History of cognitive impairments - at baseline   Posture/Postural Control   Posture/Postural Control Postural limitations   Postural Limitations Rounded Shoulders;Forward head;Flexed trunk   ROM / Strength   AROM / PROM / Strength AROM;Strength   AROM   Overall AROM  Deficits   Overall AROM Comments Tightness in hip flexors, hamstrings & heelcords.    Strength   Overall Strength Deficits   Overall Strength Comments BUEs shoulders grossly 4/5, elbows & grip grossly WFL   Strength Assessment Site Hip;Knee;Ankle   Right/Left Hip Right;Left   Right Hip Flexion 4-/5   Right Hip Extension 2+/5   Right Hip ABduction 3-/5   Left Hip Flexion 4-/5   Left Hip Extension 2+/5   Left Hip ABduction 3-/5   Right/Left Knee Right;Left   Right Knee Flexion 3+/5   Right Knee Extension 4-/5   Left Knee Flexion 3+/5   Left Knee Extension 4-/5   Right/Left Ankle Right;Left   Right Ankle Dorsiflexion 4/5   Right Ankle Plantar Flexion 3+/5   Left Ankle Dorsiflexion 4/5   Left Ankle Plantar Flexion 3+/5   Transfers   Transfers Sit to Stand;Stand to Sit;Stand Pivot Transfers   Sit to Stand 5: Supervision;With upper extremity assist;With armrests;From chair/3-in-1  uses back of legs against chair to stabilize   Stand to Sit 5: Supervision;With upper extremity assist;With armrests;To chair/3-in-1;Uncontrolled descent   Stand Pivot Transfers 5: Supervision  use of UEs required   Ambulation/Gait   Ambulation/Gait Yes   Ambulation/Gait Assistance 4: Min assist   Ambulation/Gait Assistance Details MinA for balance losses esp in turns or scanning   Ambulation Distance (Feet) 100 Feet   Assistive device None   Gait Pattern Decreased arm swing - right;Decreased arm swing - left;Decreased stride length;Decreased hip/knee flexion - right;Decreased hip/knee flexion - left;Right flexed knee in stance;Left flexed  knee in stance;Shuffle;Trunk flexed;Decreased trunk rotation;Wide base of support   Ambulation Surface Indoor;Level   Gait velocity 1.51 ft/sec   Standardized Balance Assessment   Standardized Balance Assessment Berg Balance Test;Timed Up and Go Test   Berg Balance Test   Sit to Stand Able to stand  independently using hands   Standing Unsupported Able to stand 2 minutes with supervision   Sitting with Back Unsupported but Feet Supported on Floor or Stool Able to sit safely and securely 2 minutes   Stand to Sit Controls descent by using hands   Transfers Able to transfer safely, definite need of hands   Standing Unsupported with Eyes Closed Able to stand 10 seconds with supervision   Standing Ubsupported with Feet Together Needs help to  attain position and unable to hold for 15 seconds   From Standing, Reach Forward with Outstretched Arm Reaches forward but needs supervision   From Standing Position, Pick up Object from Floor Unable to pick up and needs supervision   From Standing Position, Turn to Look Behind Over each Shoulder Needs supervision when turning   Turn 360 Degrees Needs assistance while turning   Standing Unsupported, Alternately Place Feet on Step/Stool Needs assistance to keep from falling or unable to try   Standing Unsupported, One Foot in [place] balance while stepping or standing   Standing on One Leg Unable to try or needs assist to prevent fall   Total Score 22   Timed Up and Go Test   Normal TUG (seconds) 24.31  no device with minA in turn for safety with balance loss                             PT Short Term Goals - 09/10/15 1100    PT SHORT TERM GOAL #1   Title Patient demonstrates understanding of initial HEP to address balance, strength & flexibility. (Target Date 10/11/2015)   Time 1   Period Months   Status New   PT SHORT TERM GOAL #2   Title Patient ambulates 200' with rolling walker with cues to improve safety with community  mobility.  (Target Date 10/11/2015)   Time 1   Period Months   Status New   PT SHORT TERM GOAL #3   Title Berg Balance improves to >/= 28/56 to indicate lower fall risk.  (Target Date 10/11/2015)   Time 1   Period Months   Status New   PT SHORT TERM GOAL #4   Title Patient performs floor transfers pushing on mat table with minA to improve ability to get up from floor safely.  (Target Date 10/11/2015)   Time 1   Period Months   Status New           PT Long Term Goals - 09/10/15 1100    PT LONG TERM GOAL #1   Title Verbalize understanding of fall prevention strategies in the home.  (Target Date 11/08/2015)   Time 2   Period Months   Status New   PT LONG TERM GOAL #2   Title Timed Up & Go without device <16sec to indicate lower fall risk.  (Target Date 11/08/2015)   Time 2   Period Months   Status New   PT LONG TERM GOAL #3   Title Patient ambulates around furniture  80' modified indepdent for safe household mobility.  (Target Date 11/08/2015)   Time 2   Period Months   Status New   PT LONG TERM GOAL #4   Title Patient ambulates 500', negotiates ramps & curbs with rolling walker modified independent for safe community access.  (Target Date 11/08/2015)   Time 2   Period Months   Status New   PT LONG TERM GOAL #5   Title Berg Balance test >/= 36/56 to indicate lower fall risk.  (Target Date 11/08/2015)   Time 2   Period Months   Status New   Additional Long Term Goals   Additional Long Term Goals Yes   PT LONG TERM GOAL #6   Title Patient verbalizes understanding of ongoing HEP / fitness plan.  (Target Date 11/08/2015)   Time 2   Period Months   Status New  Plan - 2015-09-11 1100    Clinical Impression Statement This 55yo female with multiple co-morbidities has had increased incidence of falls in last 6 months. She has history of osteoporosis, hip fx & compression fracture from previous falls. She has LE weakness & stiffness from immobility which  negatively impacts her balance & falls. She is decreasing her activity level due to fear of falling which increases weakness & stiffiness. Her Berg Balance test of 22/56 and Timed Up-Go 24.31sec with MinA both indicate high fall risk. Her gait speed of 1.51 ft/sec and deviations indicate high fall risk also. She has a walker & cane but does not use them. Patient would benefit from skilled PT to improve balance, gait  & safety. Her condition appears to be unstable with unknown reasons for falls and moderate compleity to treatment plan.     Pt will benefit from skilled therapeutic intervention in order to improve on the following deficits Abnormal gait;Decreased activity tolerance;Decreased balance;Decreased endurance;Decreased knowledge of precautions;Decreased mobility;Decreased knowledge of use of DME;Decreased strength;Impaired flexibility;Postural dysfunction   Rehab Potential Good   PT Frequency 2x / week   PT Duration Other (comment)  9 weeks (60 days)   PT Treatment/Interventions ADLs/Self Care Home Management;DME Instruction;Gait training;Stair training;Functional mobility training;Therapeutic activities;Therapeutic exercise;Balance training;Neuromuscular re-education;Patient/family education;Vestibular   PT Next Visit Plan [place] HEP plus hamstring and ankle DF stretches, instruct in safe walker use including barriers   Consulted and Agree with Plan of Care Patient          G-Codes - 11-Sep-2015 1100    Functional Assessment Tool Used Berg Balance 22/56, Timed Up-Go 24.31sec with minA   Functional Limitation Mobility: Walking and moving around   Mobility: Walking and Moving Around Current Status [phone]) At least 60 percent but less than 80 percent impaired, limited or restricted   Mobility: Walking and Moving Around Goal Status [phone]) At least 40 percent but less than 60 percent impaired, limited or restricted       Problem List Patient Active Problem List   Diagnosis Date Noted  .  Chronic low back pain without sciatica 06/28/2015  . OSA (obstructive sleep apnea) 06/12/2015  . Excessive daytime sleepiness 04/11/2015  . Episodic cluster headache, not intractable 04/11/2015  . Obesity, morbid ([place]) 04/11/2015  . Snoring 04/11/2015  . Insomnia 12/27/2014  . Hypothyroidism 09/04/2014  . Cognitive and behavioral changes 07/17/2014  . Confusion 07/17/2014  . History of traumatic brain injury 07/10/2014  . Recurrent falls 06/29/2014  . Osteopenia 04/04/2014  . Esophageal varices in cirrhosis - trace 03/19/2014  . Anemia 02/01/2014  . Chronic kidney disease, unspecified ([place]) 02/01/2014  . Chronic back pain 02/01/2014  . Stricture and stenosis of esophagus 01/26/2014  . Fall at home 01/24/2014  . Near syncope 01/12/2014  . Loss of weight 01/12/2014  . Constipation 11/18/2013  . Compression fracture 09/21/2013  . Cirrhosis ([place]) 09/18/2013  . Atypical chest pain 07/26/2013  . Ascites 07/19/2013  . Burn 07/03/2013  . Dyspnea 02/03/2013  . Abnormal stress echo 01/19/2013  . Tobacco abuse 10/22/2012  . Allergic conjunctivitis 06/19/2012  . Skin lesion 06/19/2012  . Lumbar spondylosis 12/24/2011  . Obesity (BMI 35.0-39.9 without comorbidity) ([place]) 12/08/2011  . Right hip pain 12/01/2011  . Chronic hepatitis C - genotype 1A 10/06/2011  . GERD (gastroesophageal reflux disease) 10/06/2011  . Hepatic encephalopathy syndrome ([place]) 07/28/2011  . Back pain 04/07/2011  . 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 THROMBOCYTOPENIA 10/21/2006  . Depression, major, recurrent, moderate ([place]) 10/21/2006  .  HYPERTENSION, BENIGN SYSTEMIC 10/21/2006  . Hepatic cirrhosis ([place]) 10/21/2006  . Generalized pruritus 10/21/2006    Jamey Reas PT, DPT 09/11/2015, 3:23 PM  [facility] [address] [place], [place], [address] Phone: [phone]   Fax:  [phone]  Name: Wendy [name] MRN: [id number] Date  of Birth: [birth year]

## 2015-09-12 ENCOUNTER — Ambulatory Visit: Payer: Medicare Other | Admitting: Physical Therapy

## 2015-09-12 ENCOUNTER — Encounter: Payer: Self-pay | Admitting: Physical Therapy

## 2015-09-12 DIAGNOSIS — R2681 Unsteadiness on feet: Secondary | ICD-10-CM | POA: Diagnosis not present

## 2015-09-12 DIAGNOSIS — R2991 Unspecified symptoms and signs involving the musculoskeletal system: Secondary | ICD-10-CM

## 2015-09-12 DIAGNOSIS — R531 Weakness: Secondary | ICD-10-CM | POA: Diagnosis not present

## 2015-09-12 DIAGNOSIS — R269 Unspecified abnormalities of gait and mobility: Secondary | ICD-10-CM

## 2015-09-12 DIAGNOSIS — R279 Unspecified lack of coordination: Secondary | ICD-10-CM | POA: Diagnosis not present

## 2015-09-12 DIAGNOSIS — R262 Difficulty in walking, not elsewhere classified: Secondary | ICD-10-CM | POA: Diagnosis not present

## 2015-09-12 DIAGNOSIS — M623 Immobility syndrome (paraplegic): Secondary | ICD-10-CM | POA: Diagnosis not present

## 2015-09-12 DIAGNOSIS — Z7409 Other reduced mobility: Secondary | ICD-10-CM

## 2015-09-12 DIAGNOSIS — M2560 Stiffness of unspecified joint, not elsewhere classified: Secondary | ICD-10-CM

## 2015-09-12 DIAGNOSIS — R2689 Other abnormalities of gait and mobility: Secondary | ICD-10-CM

## 2015-09-13 NOTE — Therapy (Signed)
[facility] [address] [place], [place], [address] Phone: [phone]   Fax:  [phone]  Physical Therapy Treatment  Patient Details  Name: Wendy [name] MRN: [id number] Date of Birth: Jul 23, 1961 Referring Provider: Sarina Ill, MD  Encounter Date: 09/12/2015      PT End of Session - 09/12/15 1530    Visit Number 2   Number of Visits 18   Date for PT Re-Evaluation 11/08/15   Authorization Type Medicare G-Code & progress report every 10 visits   PT Start Time 1446   PT Stop Time 1530   PT Time Calculation (min) 44 min   Equipment Utilized During Treatment Gait belt   Activity Tolerance Patient limited by fatigue   Behavior During Therapy Anxious;WFL for tasks assessed/performed      Past Medical History  Diagnosis Date  . Hepatic cirrhosis due to chronic hepatitis C infection ([place])     ETOH causative as well  . Hiatal hernia   . Esophageal stricture     esophageal dysmotility and chronic dysphagia as well  . 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 History of alcohol abuse   . Anxiety and depression   . History of hip fracture   . Allergic rhinitis   . HTN (hypertension)   . Blood transfusion   . Cataract     MILD  . GERD (gastroesophageal reflux disease)   . Osteoporosis   . Ulcer     2007  . Arthritis   . Clotting disorder ([place])     prolonged clotting time due to liver disease  . Paraesophageal hernia   . Compression fracture 09/21/2013  . Hepatic encephalopathy syndrome [facility])     Past Surgical History  Procedure Laterality Date  . Orif hip fracture  2010    right x2  . Upper gastrointestinal endoscopy  08/05/2007    esophageal ring, hiatal hernia, portal gastropathy  . Splenectomy      age 53  . Burr hole for subdural hematoma  2004  . Upper gastrointestinal endoscopy  10/14/2011  . Colonoscopy  08/2012    moderatel left colon tics  . Kyphoplasty Bilateral  09/21/2013    Procedure: T12 - L1 KYPHOPLASTY;  Surgeon: Melina Schools, MD;  Location: [facility];  Service: Orthopedics;  Laterality: Bilateral;  . Esophagogastroduodenoscopy (egd) with propofol N/A 01/26/2014    Procedure: ESOPHAGOGASTRODUODENOSCOPY (EGD) WITH PROPOFOL;  Surgeon: [place] Dragon, MD;  Location: [facility] ENDOSCOPY;  Service: Endoscopy;  Laterality: N/A;    There were no vitals filed for this visit.  Visit Diagnosis:  Abnormality of gait  Weakness generalized  Stiffness due to immobility  Unsteadiness  Balance problems  Impaired transfers  Lack of coordination      Subjective Assessment - 09/12/15 1451    Subjective She had one near fall but held wall for support until her legs would support her. She reports muscle soreness from evaluation.    Currently in Pain? No/denies                         [facility] Adult PT Treatment/Exercise - 09/12/15 1530    Transfers   Transfers Sit to Stand;Stand to Sit;Stand Pivot Transfers   Sit to Stand 5: Supervision;With upper extremity assist;With armrests;From chair/3-in-1  uses back of legs against chair to stabilize   Sit to Stand Details (indicate cue type and reason) demo & verbal cues on technique with RW  Stand to Sit 5: Supervision;With upper extremity assist;With armrests;To chair/3-in-1;Uncontrolled descent   Stand to Sit Details demo & verbal cues on technique with RW   Stand Pivot Transfers --   Ambulation/Gait   Ambulation/Gait Yes   Ambulation/Gait Assistance 5: Supervision   Ambulation/Gait Assistance Details PT demo, instructed in proper use of RW, tactile during gait for proper position within RW   Ambulation Distance (Feet) 120 Feet  120' & 100'   Assistive device Rolling walker   Gait Pattern Decreased arm swing - right;Decreased arm swing - left;Decreased stride length;Decreased hip/knee flexion - right;Decreased hip/knee flexion - left;Right flexed knee in stance;Left flexed knee in  stance;Shuffle;Trunk flexed;Decreased trunk rotation;Wide base of support   Ambulation Surface Indoor;Level   Gait velocity --   Posture/Postural Control   Posture/Postural Control --   Postural Limitations --             Balance Exercises - 09/13/15 1245    [place] PROGRAM   Head Movements Sitting;5 reps   Neck Movements Sitting;5 reps   Back Extension Standing;5 reps  RW support   Trunk Movements Standing;5 reps  RW support   Ankle Movements Sitting;10 reps   Knee Extensor 10 reps  no weight   Knee Flexor 10 reps  RW support no wt   Hip ABductor 10 reps  RW support no wt           PT Education - 09/12/15 1530    Education provided Yes   Education Details initiated [employer] with instructions okay to break up 2-4 exercises at time working way thru book / Engineer, maintenance (IT)) Educated Patient   Methods Explanation;Demonstration;Tactile cues;Verbal cues;Handout   Comprehension Verbalized understanding;Returned demonstration;Verbal cues required;Tactile cues required;Need further instruction          PT Short Term Goals - 09/10/15 1100    PT SHORT TERM GOAL #1   Title Patient demonstrates understanding of initial HEP to address balance, strength & flexibility. (Target Date 10/11/2015)   Time 1   Period Months   Status New   PT SHORT TERM GOAL #2   Title Patient ambulates 200' with rolling walker with cues to improve safety with community mobility.  (Target Date 10/11/2015)   Time 1   Period Months   Status New   PT SHORT TERM GOAL #3   Title Berg Balance improves to >/= 28/56 to indicate lower fall risk.  (Target Date 10/11/2015)   Time 1   Period Months   Status New   PT SHORT TERM GOAL #4   Title Patient performs floor transfers pushing on mat table with minA to improve ability to get up from floor safely.  (Target Date 10/11/2015)   Time 1   Period Months   Status New           PT Long Term Goals - 09/10/15 1100    PT LONG TERM GOAL #1   Title  Verbalize understanding of fall prevention strategies in the home.  (Target Date 11/08/2015)   Time 2   Period Months   Status New   PT LONG TERM GOAL #2   Title Timed Up & Go without device <16sec to indicate lower fall risk.  (Target Date 11/08/2015)   Time 2   Period Months   Status New   PT LONG TERM GOAL #3   Title Patient ambulates around furniture  31' modified indepdent for safe household mobility.  (Target Date 11/08/2015)   Time 2  Period Months   Status New   PT LONG TERM GOAL #4   Title Patient ambulates 500', negotiates ramps & curbs with rolling walker modified independent for safe community access.  (Target Date 11/08/2015)   Time 2   Period Months   Status New   PT LONG TERM GOAL #5   Title Berg Balance test >/= 36/56 to indicate lower fall risk.  (Target Date 11/08/2015)   Time 2   Period Months   Status New   Additional Long Term Goals   Additional Long Term Goals Yes   PT LONG TERM GOAL #6   Title Patient verbalizes understanding of ongoing HEP / fitness plan.  (Target Date 11/08/2015)   Time 2   Period Months   Status New               Plan - 09/12/15 1530    Clinical Impression Statement Patient's gait was more stable with RW but she needs additional instructions including use on barriers. Patient seems to understand [place] initial exercises but she is so deconditioned that initial 8 exercises with rest after each one still caused muscles to tremor /shake with fatigue.    Pt will benefit from skilled therapeutic intervention in order to improve on the following deficits Abnormal gait;Decreased activity tolerance;Decreased balance;Decreased endurance;Decreased knowledge of precautions;Decreased mobility;Decreased knowledge of use of DME;Decreased strength;Impaired flexibility;Postural dysfunction   Rehab Potential Good   PT Frequency 2x / week   PT Duration Other (comment)  9 weeks (60 days)   PT Treatment/Interventions ADLs/Self Care Home Management;DME  Instruction;Gait training;Stair training;Functional mobility training;Therapeutic activities;Therapeutic exercise;Balance training;Neuromuscular re-education;Patient/family education;Vestibular   PT Next Visit Plan [place] & continue [place] HEP plus hamstring and ankle DF stretches, instruct in safe walker use including barriers   Consulted and Agree with Plan of Care Patient        Problem List Patient Active Problem List   Diagnosis Date Noted  . Chronic low back pain without sciatica 06/28/2015  . OSA (obstructive sleep apnea) 06/12/2015  . Excessive daytime sleepiness 04/11/2015  . Episodic cluster headache, not intractable 04/11/2015  . Obesity, morbid ([place]) 04/11/2015  . Snoring 04/11/2015  . Insomnia 12/27/2014  . Hypothyroidism 09/04/2014  . Cognitive and behavioral changes 07/17/2014  . Confusion 07/17/2014  . History of traumatic brain injury 07/10/2014  . Recurrent falls 06/29/2014  . Osteopenia 04/04/2014  . Esophageal varices in cirrhosis - trace 03/19/2014  . Anemia 02/01/2014  . Chronic kidney disease, unspecified ([place]) 02/01/2014  . Chronic back pain 02/01/2014  . Stricture and stenosis of esophagus 01/26/2014  . Fall at home 01/24/2014  . Near syncope 01/12/2014  . Loss of weight 01/12/2014  . Constipation 11/18/2013  . Compression fracture 09/21/2013  . Cirrhosis ([place]) 09/18/2013  . Atypical chest pain 07/26/2013  . Ascites 07/19/2013  . Burn 07/03/2013  . Dyspnea 02/03/2013  . Abnormal stress echo 01/19/2013  . Tobacco abuse 10/22/2012  . Allergic conjunctivitis 06/19/2012  . Skin lesion 06/19/2012  . Lumbar spondylosis 12/24/2011  . Obesity (BMI 35.0-39.9 without comorbidity) ([place]) 12/08/2011  . Right hip pain 12/01/2011  . Chronic hepatitis C - genotype 1A 10/06/2011  . GERD (gastroesophageal reflux disease) 10/06/2011  . Hepatic encephalopathy syndrome ([place]) 07/28/2011  . Back pain 04/07/2011  . 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  THROMBOCYTOPENIA 10/21/2006  . Depression, major, recurrent, moderate ([place]) 10/21/2006  . HYPERTENSION, BENIGN SYSTEMIC 10/21/2006  . Hepatic cirrhosis ([place]) 10/21/2006  . Generalized pruritus 10/21/2006  Jamey Reas PT, DPT 09/13/2015, 1:00 PM  [facility] [address] [place], [place], [address] Phone: [phone]   Fax:  [phone]  Name: Wendy [name] MRN: [id number] Date of Birth: [birth year]

## 2015-09-16 ENCOUNTER — Ambulatory Visit: Payer: Medicare Other | Admitting: Physical Therapy

## 2015-09-16 ENCOUNTER — Encounter: Payer: Self-pay | Admitting: Physical Therapy

## 2015-09-16 DIAGNOSIS — R269 Unspecified abnormalities of gait and mobility: Secondary | ICD-10-CM

## 2015-09-16 DIAGNOSIS — R2681 Unsteadiness on feet: Secondary | ICD-10-CM

## 2015-09-16 DIAGNOSIS — R262 Difficulty in walking, not elsewhere classified: Secondary | ICD-10-CM

## 2015-09-16 DIAGNOSIS — M623 Immobility syndrome (paraplegic): Secondary | ICD-10-CM | POA: Diagnosis not present

## 2015-09-16 DIAGNOSIS — M2560 Stiffness of unspecified joint, not elsewhere classified: Secondary | ICD-10-CM

## 2015-09-16 DIAGNOSIS — R531 Weakness: Secondary | ICD-10-CM

## 2015-09-16 DIAGNOSIS — R279 Unspecified lack of coordination: Secondary | ICD-10-CM

## 2015-09-16 DIAGNOSIS — R2689 Other abnormalities of gait and mobility: Secondary | ICD-10-CM

## 2015-09-16 DIAGNOSIS — R2991 Unspecified symptoms and signs involving the musculoskeletal system: Secondary | ICD-10-CM

## 2015-09-16 DIAGNOSIS — Z7409 Other reduced mobility: Secondary | ICD-10-CM

## 2015-09-16 NOTE — Therapy (Signed)
[facility] [address] [place], [place], [address] Phone: [phone]   Fax:  [phone]  Physical Therapy Treatment  Patient Details  Name: Wendy [name] MRN: [id number] Date of Birth: 29-Jul-1961 Referring Provider: Sarina Ill, MD  Encounter Date: 09/16/2015      PT End of Session - 09/16/15 1244    Visit Number 3   Number of Visits 18   Date for PT Re-Evaluation 11/08/15   Authorization Type Medicare G-Code & progress report every 10 visits   PT Start Time 1234   PT Stop Time 1314   PT Time Calculation (min) 40 min   Equipment Utilized During Treatment Gait belt   Activity Tolerance Patient limited by fatigue   Behavior During Therapy Anxious;WFL for tasks assessed/performed      Past Medical History  Diagnosis Date  . Hepatic cirrhosis due to chronic hepatitis C infection ([place])     ETOH causative as well  . Hiatal hernia   . Esophageal stricture     esophageal dysmotility and chronic dysphagia as well  . 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 History of alcohol abuse   . Anxiety and depression   . History of hip fracture   . Allergic rhinitis   . HTN (hypertension)   . Blood transfusion   . Cataract     MILD  . GERD (gastroesophageal reflux disease)   . Osteoporosis   . Ulcer     2007  . Arthritis   . Clotting disorder ([place])     prolonged clotting time due to liver disease  . Paraesophageal hernia   . Compression fracture 09/21/2013  . Hepatic encephalopathy syndrome [facility])     Past Surgical History  Procedure Laterality Date  . Orif hip fracture  2010    right x2  . Upper gastrointestinal endoscopy  08/05/2007    esophageal ring, hiatal hernia, portal gastropathy  . Splenectomy      age 33  . Burr hole for subdural hematoma  2004  . Upper gastrointestinal endoscopy  10/14/2011  . Colonoscopy  08/2012    moderatel left colon tics  . Kyphoplasty Bilateral  09/21/2013    Procedure: T12 - L1 KYPHOPLASTY;  Surgeon: Melina Schools, MD;  Location: [facility];  Service: Orthopedics;  Laterality: Bilateral;  . Esophagogastroduodenoscopy (egd) with propofol N/A 01/26/2014    Procedure: ESOPHAGOGASTRODUODENOSCOPY (EGD) WITH PROPOFOL;  Surgeon: Lafayette Dragon, MD;  Location[facility];  Service: Endoscopy;  Laterality: N/A;    There were no vitals filed for this visit.  Visit Diagnosis:  Abnormality of gait  Weakness generalized  Stiffness due to immobility  Unsteadiness  Difficulty walking  Lack of coordination  Impaired transfers  Balance problems      Subjective Assessment - 09/16/15 1239    Subjective Reports a near fall at the store [employer]. Was walking along, started shaking and yelled out. Her friend came over to her and walked her to the car. Had 2 other near falls at the house. Only able to recall 1 of them, when standing up from chair she started shaking and had to sit back down for fear of falling. Having leg and back pain today.  Patient Stated Goals She wants to walk better & get off floor   Currently in Pain? Yes   Pain Score 5    Pain Location Leg   Pain Orientation Left   Pain Descriptors / Indicators Aching;Sore   Pain Type Chronic pain   Pain Onset More than a month ago   Pain Frequency Intermittent   Aggravating Factors  none that she knows off   Pain Relieving Factors sleeping/resting           Balance Exercises - 09/16/15 1247    [place] PROGRAM   Head Movements Sitting;5 reps   Neck Movements Sitting;5 reps   Back Extension Standing;5 reps  walker support   Trunk Movements Standing;5 reps  walker support   Ankle Movements Sitting;10 reps   Knee Extensor 10 reps  seated, no weights   Knee Flexor 10 reps  with walker support, no weight   Hip ABductor 10 reps  with walker support, no weights   Ankle Plantorflexors --  with walker support x 10 reps    Ankle Dorsiflexors --  walker support x 10 reps            PT Short Term Goals - 09/10/15 1100    PT SHORT TERM GOAL #1   Title Patient demonstrates understanding of initial HEP to address balance, strength & flexibility. (Target Date 10/11/2015)   Time 1   Period Months   Status New   PT SHORT TERM GOAL #2   Title Patient ambulates 200' with rolling walker with cues to improve safety with community mobility.  (Target Date 10/11/2015)   Time 1   Period Months   Status New   PT SHORT TERM GOAL #3   Title Berg Balance improves to >/= 28/56 to indicate lower fall risk.  (Target Date 10/11/2015)   Time 1   Period Months   Status New   PT SHORT TERM GOAL #4   Title Patient performs floor transfers pushing on mat table with minA to improve ability to get up from floor safely.  (Target Date 10/11/2015)   Time 1   Period Months   Status New           PT Long Term Goals - 09/10/15 1100    PT LONG TERM GOAL #1   Title Verbalize understanding of fall prevention strategies in the home.  (Target Date 11/08/2015)   Time 2   Period Months   Status New   PT LONG TERM GOAL #2   Title Timed Up & Go without device <16sec to indicate lower fall risk.  (Target Date 11/08/2015)   Time 2   Period Months   Status New   PT LONG TERM GOAL #3   Title Patient ambulates around furniture  78' modified indepdent for safe household mobility.  (Target Date 11/08/2015)   Time 2   Period Months   Status New   PT LONG TERM GOAL #4   Title Patient ambulates 500', negotiates ramps & curbs with rolling walker modified independent for safe community access.  (Target Date 11/08/2015)   Time 2   Period Months   Status New   PT LONG TERM GOAL #5   Title Berg Balance test >/= 36/56 to indicate lower fall risk.  (Target Date 11/08/2015)   Time 2   Period Months   Status New   Additional Long Term Goals   Additional Long Term Goals Yes   PT LONG TERM GOAL #6   Title Patient  verbalizes understanding of  ongoing HEP / fitness plan.  (Target Date 11/08/2015)   Time 2   Period Months   Status New           Plan - 09/16/15 1245    Clinical Impression Statement Skilled session focued on [place] program for HEP. Pt needed cues to perform those already issued correctly and added 2 additional exercises today. Reinforced importance of compliance with HEP for optimal benifits from PT after pt reported not doing the exercises at home. Pt verbalized understanding.                                 Pt will benefit from skilled therapeutic intervention in order to improve on the following deficits Abnormal gait;Decreased activity tolerance;Decreased balance;Decreased endurance;Decreased knowledge of precautions;Decreased mobility;Decreased knowledge of use of DME;Decreased strength;Impaired flexibility;Postural dysfunction   Rehab Potential Good   PT Frequency 2x / week   PT Duration Other (comment)  9 weeks (60 days)   PT Treatment/Interventions ADLs/Self Care Home Management;DME Instruction;Gait training;Stair training;Functional mobility training;Therapeutic activities;Therapeutic exercise;Balance training;Neuromuscular re-education;Patient/family education;Vestibular   PT Next Visit Plan Reveiw & continue [place] HEP plus hamstring and ankle DF stretches, instruct in safe walker use including barriers   Consulted and Agree with Plan of Care Patient        Problem List Patient Active Problem List   Diagnosis Date Noted  . Chronic low back pain without sciatica 06/28/2015  . OSA (obstructive sleep apnea) 06/12/2015  . Excessive daytime sleepiness 04/11/2015  . Episodic cluster headache, not intractable 04/11/2015  . Obesity, morbid ([place]) 04/11/2015  . Snoring 04/11/2015  . Insomnia 12/27/2014  . Hypothyroidism 09/04/2014  . Cognitive and behavioral changes 07/17/2014  . Confusion 07/17/2014  . History of traumatic brain injury 07/10/2014  . Recurrent falls 06/29/2014  . Osteopenia 04/04/2014   . Esophageal varices in cirrhosis - trace 03/19/2014  . Anemia 02/01/2014  . Chronic kidney disease, unspecified ([place]) 02/01/2014  . Chronic back pain 02/01/2014  . Stricture and stenosis of esophagus 01/26/2014  . Fall at home 01/24/2014  . Near syncope 01/12/2014  . Loss of weight 01/12/2014  . Constipation 11/18/2013  . Compression fracture 09/21/2013  . Cirrhosis ([address]) 09/18/2013  . Atypical chest pain 07/26/2013  . Ascites 07/19/2013  . Burn 07/03/2013  . Dyspnea 02/03/2013  . Abnormal stress echo 01/19/2013  . Tobacco abuse 10/22/2012  . Allergic conjunctivitis 06/19/2012  . Skin lesion 06/19/2012  . Lumbar spondylosis 12/24/2011  . Obesity (BMI 35.0-39.9 without comorbidity) ([place]) 12/08/2011  . Right hip pain 12/01/2011  . Chronic hepatitis C - genotype 1A 10/06/2011  . GERD (gastroesophageal reflux disease) 10/06/2011  . Hepatic encephalopathy syndrome ([place]) 07/28/2011  . Back pain 04/07/2011  . 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 THROMBOCYTOPENIA 10/21/2006  . Depression, major, recurrent, moderate ([place]) 10/21/2006  . HYPERTENSION, BENIGN SYSTEMIC 10/21/2006  . Hepatic cirrhosis ([place]) 10/21/2006  . Generalized pruritus 10/21/2006    Willow Ora 09/16/2015, 3:59 PM  Willow Ora, PTA, [facility] [address], [place], [place] [address] [phone] 09/16/2015, 3:59 PM   Name: Wendy [name] MRN: [id number] Date of Birth: 02-08-1961

## 2015-09-17 ENCOUNTER — Telehealth: Payer: Self-pay | Admitting: Family Medicine

## 2015-09-17 ENCOUNTER — Encounter: Payer: Self-pay | Admitting: Family Medicine

## 2015-09-17 NOTE — Telephone Encounter (Signed)
Called patient and gave her Mychart activation code: [id number]

## 2015-09-17 NOTE — Telephone Encounter (Signed)
Pt wants to sign up for MyChart but doesn't have the code

## 2015-09-19 ENCOUNTER — Ambulatory Visit (INDEPENDENT_AMBULATORY_CARE_PROVIDER_SITE_OTHER): Payer: Medicare Other | Admitting: Neurology

## 2015-09-19 ENCOUNTER — Encounter: Payer: Self-pay | Admitting: Neurology

## 2015-09-19 ENCOUNTER — Ambulatory Visit: Payer: Medicare Other | Admitting: Physical Therapy

## 2015-09-19 ENCOUNTER — Telehealth: Payer: Self-pay

## 2015-09-19 ENCOUNTER — Encounter: Payer: Self-pay | Admitting: Physical Therapy

## 2015-09-19 VITALS — BP 120/92 | HR 86 | Resp 20 | Ht 62.0 in | Wt 211.0 lb

## 2015-09-19 DIAGNOSIS — R2681 Unsteadiness on feet: Secondary | ICD-10-CM | POA: Diagnosis not present

## 2015-09-19 DIAGNOSIS — Z7409 Other reduced mobility: Secondary | ICD-10-CM

## 2015-09-19 DIAGNOSIS — M623 Immobility syndrome (paraplegic): Secondary | ICD-10-CM | POA: Diagnosis not present

## 2015-09-19 DIAGNOSIS — M2560 Stiffness of unspecified joint, not elsewhere classified: Secondary | ICD-10-CM

## 2015-09-19 DIAGNOSIS — R279 Unspecified lack of coordination: Secondary | ICD-10-CM

## 2015-09-19 DIAGNOSIS — R269 Unspecified abnormalities of gait and mobility: Secondary | ICD-10-CM | POA: Diagnosis not present

## 2015-09-19 DIAGNOSIS — Z91199 Patient's noncompliance with other medical treatment and regimen due to unspecified reason: Secondary | ICD-10-CM

## 2015-09-19 DIAGNOSIS — Z9119 Patient's noncompliance with other medical treatment and regimen: Secondary | ICD-10-CM

## 2015-09-19 DIAGNOSIS — R262 Difficulty in walking, not elsewhere classified: Secondary | ICD-10-CM | POA: Diagnosis not present

## 2015-09-19 DIAGNOSIS — R2689 Other abnormalities of gait and mobility: Secondary | ICD-10-CM

## 2015-09-19 DIAGNOSIS — R531 Weakness: Secondary | ICD-10-CM

## 2015-09-19 DIAGNOSIS — R2991 Unspecified symptoms and signs involving the musculoskeletal system: Secondary | ICD-10-CM

## 2015-09-19 DIAGNOSIS — G4719 Other hypersomnia: Secondary | ICD-10-CM | POA: Diagnosis not present

## 2015-09-19 NOTE — Telephone Encounter (Signed)
Dr. Brett Fairy has ordered a dismissal for this pt for medical noncompliance.
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
## 2015-09-20 ENCOUNTER — Telehealth: Payer: Self-pay | Admitting: Family Medicine

## 2015-09-20 DIAGNOSIS — G4733 Obstructive sleep apnea (adult) (pediatric): Secondary | ICD-10-CM

## 2015-09-20 DIAGNOSIS — Z87820 Personal history of traumatic brain injury: Secondary | ICD-10-CM

## 2015-09-20 NOTE — Telephone Encounter (Signed)
Would like to talk to dr k about going to [facility] and the letter she received from them

## 2015-09-20 NOTE — Therapy (Signed)
[facility] [address] [place], [place], [address] Phone: [phone]   Fax:  [phone]  Physical Therapy Treatment  Patient Details  Name: Wendy [name] MRN: [id number] Date of Birth: 1960/11/11 Referring Provider: Sarina Ill, MD  Encounter Date: 09/19/2015      PT End of Session - 09/19/15 1028    Visit Number 4   Number of Visits 18   Date for PT Re-Evaluation 11/08/15   Authorization Type Medicare G-Code & progress report every 10 visits   PT Start Time 1015   PT Stop Time 1100   PT Time Calculation (min) 45 min   Equipment Utilized During Treatment Gait belt   Activity Tolerance Patient limited by fatigue   Behavior During Therapy Anxious;WFL for tasks assessed/performed      Past Medical History  Diagnosis Date  . Hepatic cirrhosis due to chronic hepatitis C infection ([place])     ETOH causative as well  . Hiatal hernia   . Esophageal stricture     esophageal dysmotility and chronic dysphagia as well  . 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 History of alcohol abuse   . Anxiety and depression   . History of hip fracture   . Allergic rhinitis   . HTN (hypertension)   . Blood transfusion   . Cataract     MILD  . GERD (gastroesophageal reflux disease)   . Osteoporosis   . Ulcer     2007  . Arthritis   . Clotting disorder ([place])     prolonged clotting time due to liver disease  . Paraesophageal hernia   . Compression fracture 09/21/2013  . Hepatic encephalopathy syndrome [facility])     Past Surgical History  Procedure Laterality Date  . Orif hip fracture  2010    right x2  . Upper gastrointestinal endoscopy  08/05/2007    esophageal ring, hiatal hernia, portal gastropathy  . Splenectomy      age 79  . Burr hole for subdural hematoma  2004  . Upper gastrointestinal endoscopy  10/14/2011  . Colonoscopy  08/2012    moderatel left colon tics  . Kyphoplasty Bilateral  09/21/2013    Procedure: T12 - L1 KYPHOPLASTY;  Surgeon: Melina Schools, MD;  Location: [facility];  Service: Orthopedics;  Laterality: Bilateral;  . Esophagogastroduodenoscopy (egd) with propofol N/A 01/26/2014    Procedure: ESOPHAGOGASTRODUODENOSCOPY (EGD) WITH PROPOFOL;  Surgeon: Lafayette Dragon, MD;  Location: [facility] ENDOSCOPY;  Service: Endoscopy;  Laterality: N/A;    There were no vitals filed for this visit.  Visit Diagnosis:  Abnormality of gait  Weakness generalized  Stiffness due to immobility  Unsteadiness  Difficulty walking  Lack of coordination  Impaired transfers  Balance problems      Subjective Assessment - 09/19/15 1021    Subjective Has had 2 falls since last PT visist. 1st fall was at [employer] on Monday afternoon after PT. Got up from her stall and took a few steps. Was falling forward, however the staff caught her before she hit the ground. Did not have her walker. 2cd fall was at her house in her room. Was trying to go to bathroom. Made it as far as the end of her bed and knees gave way. She fell straight down, and landed on her buttocks and hurt her right arm. Still sore today.  Was not using her walker this time as well.  Patient Stated Goals She wants to walk better & get off floor   Currently in Pain? Yes   Pain Score 7    Pain Location Hip   Pain Orientation Right   Pain Descriptors / Indicators Aching;Sore   Pain Type Acute pain   Pain Onset In the past 7 days   Pain Frequency Intermittent   Aggravating Factors  from fall   Pain Relieving Factors sleeping/resting            Balance Exercises - 09/19/15 1029    OTAGO PROGRAM   Head Movements Sitting;5 reps   Neck Movements Sitting;5 reps   Back Extension Standing;5 reps  with walker   Trunk Movements Standing;5 reps  with walker   Ankle Movements Sitting;10 reps   Knee Extensor 10 reps  no weight   Knee Flexor 10 reps  no weight   Hip ABductor 10  reps  no weight, alternating legs   Ankle Plantorflexors --  10 reps with walker support   Ankle Dorsiflexors --  10 reps with walker support   Knee Bends 10 reps, support   Sit to Stand 10 reps, bilateral support  light to no support    Overall OTAGO Comments continues to need cues on correct ex form and technique           PT Education - 09/19/15 1630    Education provided Yes   Education Details Use of RW at all times for fall prevention:OTAGO ex's    Person(s) Educated Patient   Methods Explanation;Demonstration;Handout;Verbal cues   Comprehension Verbalized understanding;Returned demonstration;Verbal cues required;Tactile cues required;Need further instruction             PT Short Term Goals - 09/10/15 1100    PT SHORT TERM GOAL #1   Title Patient demonstrates understanding of initial HEP to address balance, strength & flexibility. (Target Date 10/11/2015)   Time 1   Period Months   Status New   PT SHORT TERM GOAL #2   Title Patient ambulates 200' with rolling walker with cues to improve safety with community mobility.  (Target Date 10/11/2015)   Time 1   Period Months   Status New   PT SHORT TERM GOAL #3   Title Berg Balance improves to >/= 28/56 to indicate lower fall risk.  (Target Date 10/11/2015)   Time 1   Period Months   Status New   PT SHORT TERM GOAL #4   Title Patient performs floor transfers pushing on mat table with minA to improve ability to get up from floor safely.  (Target Date 10/11/2015)   Time 1   Period Months   Status New           PT Long Term Goals - 09/10/15 1100    PT LONG TERM GOAL #1   Title Verbalize understanding of fall prevention strategies in the home.  (Target Date 11/08/2015)   Time 2   Period Months   Status New   PT LONG TERM GOAL #2   Title Timed Up & Go without device <16sec to indicate lower fall risk.  (Target Date 11/08/2015)   Time 2   Period Months   Status New   PT LONG TERM GOAL #3   Title Patient  ambulates around furniture  52' modified indepdent for safe household mobility.  (Target Date 11/08/2015)   Time 2   Period Months   Status New   PT LONG TERM GOAL #4   Title Patient ambulates 500',  negotiates ramps & curbs with rolling walker modified independent for safe community access.  (Target Date 11/08/2015)   Time 2   Period Months   Status New   PT LONG TERM GOAL #5   Title Berg Balance test >/= 36/56 to indicate lower fall risk.  (Target Date 11/08/2015)   Time 2   Period Months   Status New   Additional Long Term Goals   Additional Long Term Goals Yes   PT LONG TERM GOAL #6   Title Patient verbalizes understanding of ongoing HEP / fitness plan.  (Target Date 11/08/2015)   Time 2   Period Months   Status New            Plan - 09/19/15 1028    Clinical Impression Statement Reinforced use of walker at all times for safety and fall prevention. Pt verbalized understanding. Reviewed OTAGO ex's issued to date and added 2 new ones. Will plan to stop here for now until pt is compliant and independant with these. Pt making slow progress toward goals.                                      Pt will benefit from skilled therapeutic intervention in order to improve on the following deficits Abnormal gait;Decreased activity tolerance;Decreased balance;Decreased endurance;Decreased knowledge of precautions;Decreased mobility;Decreased knowledge of use of DME;Decreased strength;Impaired flexibility;Postural dysfunction   Rehab Potential Good   PT Frequency 2x / week   PT Duration Other (comment)  9 weeks (60 days)   PT Treatment/Interventions ADLs/Self Care Home Management;DME Instruction;Gait training;Stair training;Functional mobility training;Therapeutic activities;Therapeutic exercise;Balance training;Neuromuscular re-education;Patient/family education;Vestibular   PT Next Visit Plan continue to review OTAGO ex's until pt is independant with handout, work on hamstring/DF stretching,  safety with walker including barriers   Consulted and Agree with Plan of Care Patient        Problem List Patient Active Problem List   Diagnosis Date Noted  . Chronic low back pain without sciatica 06/28/2015  . OSA (obstructive sleep apnea) 06/12/2015  . Excessive daytime sleepiness 04/11/2015  . Episodic cluster headache, not intractable 04/11/2015  . Obesity, morbid ([place]) 04/11/2015  . Snoring 04/11/2015  . Insomnia 12/27/2014  . Hypothyroidism 09/04/2014  . Cognitive and behavioral changes 07/17/2014  . Confusion 07/17/2014  . History of traumatic brain injury 07/10/2014  . Recurrent falls 06/29/2014  . Osteopenia 04/04/2014  . Esophageal varices in cirrhosis - trace 03/19/2014  . Anemia 02/01/2014  . Chronic kidney disease, unspecified ([place]) 02/01/2014  . Chronic back pain 02/01/2014  . Stricture and stenosis of esophagus 01/26/2014  . Fall at home 01/24/2014  . Near syncope 01/12/2014  . Loss of weight 01/12/2014  . Constipation 11/18/2013  . Compression fracture 09/21/2013  . Cirrhosis ([place]) 09/18/2013  . Atypical chest pain 07/26/2013  . Ascites 07/19/2013  . Burn 07/03/2013  . Dyspnea 02/03/2013  . Abnormal stress echo 01/19/2013  . Tobacco abuse 10/22/2012  . Allergic conjunctivitis 06/19/2012  . Skin lesion 06/19/2012  . Lumbar spondylosis 12/24/2011  . Obesity (BMI 35.0-39.9 without comorbidity) ([place]) 12/08/2011  . Right hip pain 12/01/2011  . Chronic hepatitis C - genotype 1A 10/06/2011  . GERD (gastroesophageal reflux disease) 10/06/2011  . Hepatic encephalopathy syndrome ([place]) 07/28/2011  . Back pain 04/07/2011  . 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 THROMBOCYTOPENIA 10/21/2006  . Depression, major, recurrent, moderate ([place]) 10/21/2006  . HYPERTENSION, BENIGN SYSTEMIC  10/21/2006  . Hepatic cirrhosis ([place]) 10/21/2006  . Generalized pruritus 10/21/2006    Willow Ora 09/20/2015, 3:16 PM  Willow Ora, PTA, [place] [facility] [address], [place], [place] [id number] [phone] 09/20/2015, 3:16 PM   Name: Wendy [name] MRN: [id number] Date of Birth: 01-12-1961

## 2015-09-23 ENCOUNTER — Encounter: Payer: Self-pay | Admitting: Physical Therapy

## 2015-09-23 ENCOUNTER — Other Ambulatory Visit: Payer: Self-pay | Admitting: Family Medicine

## 2015-09-23 ENCOUNTER — Telehealth: Payer: Self-pay | Admitting: Neurology

## 2015-09-23 ENCOUNTER — Ambulatory Visit: Payer: Medicare Other | Admitting: Physical Therapy

## 2015-09-23 DIAGNOSIS — R2681 Unsteadiness on feet: Secondary | ICD-10-CM

## 2015-09-23 DIAGNOSIS — R531 Weakness: Secondary | ICD-10-CM | POA: Diagnosis not present

## 2015-09-23 DIAGNOSIS — Z7409 Other reduced mobility: Secondary | ICD-10-CM

## 2015-09-23 DIAGNOSIS — R2991 Unspecified symptoms and signs involving the musculoskeletal system: Secondary | ICD-10-CM

## 2015-09-23 DIAGNOSIS — R269 Unspecified abnormalities of gait and mobility: Secondary | ICD-10-CM | POA: Diagnosis not present

## 2015-09-23 DIAGNOSIS — R279 Unspecified lack of coordination: Secondary | ICD-10-CM | POA: Diagnosis not present

## 2015-09-23 DIAGNOSIS — Z720 Tobacco use: Secondary | ICD-10-CM

## 2015-09-23 DIAGNOSIS — R2689 Other abnormalities of gait and mobility: Secondary | ICD-10-CM

## 2015-09-23 DIAGNOSIS — R262 Difficulty in walking, not elsewhere classified: Secondary | ICD-10-CM

## 2015-09-23 DIAGNOSIS — M623 Immobility syndrome (paraplegic): Secondary | ICD-10-CM | POA: Diagnosis not present

## 2015-09-23 DIAGNOSIS — M2560 Stiffness of unspecified joint, not elsewhere classified: Secondary | ICD-10-CM

## 2015-09-23 NOTE — Telephone Encounter (Signed)
Cyril Mourning- Spoke to Dr Jaynee Eagles and pt is discharged from our practice. She will have to go somewhere else for medical management.  She cannot do EEG through our practice since she was discharged. Thank you!

## 2015-09-23 NOTE — Therapy (Signed)
[facility] [address] [place], [place], [address] Phone: [phone]   Fax:  [phone]  Physical Therapy Treatment  Patient Details  Name: Wendy [name] MRN: [id number] Date of Birth: [birth year] Referring Provider: Sarina Ill, MD  Encounter Date: 09/23/2015      PT End of Session - 09/23/15 1230    Visit Number 5   Number of Visits 18   Date for PT Re-Evaluation 11/08/15   Authorization Type Medicare G-Code & progress report every 10 visits   PT Start Time 1148   PT Stop Time 1230   PT Time Calculation (min) 42 min   Equipment Utilized During Treatment Gait belt   Activity Tolerance Patient limited by fatigue   Behavior During Therapy Anxious;WFL for tasks assessed/performed      Past Medical History  Diagnosis Date  . Hepatic cirrhosis due to chronic hepatitis C infection ([place])     ETOH causative as well  . Hiatal hernia   . Esophageal stricture     esophageal dysmotility and chronic dysphagia as well  . 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 History of alcohol abuse   . Anxiety and depression   . History of hip fracture   . Allergic rhinitis   . HTN (hypertension)   . Blood transfusion   . Cataract     MILD  . GERD (gastroesophageal reflux disease)   . Osteoporosis   . Ulcer     2007  . Arthritis   . Clotting disorder ([place])     prolonged clotting time due to liver disease  . Paraesophageal hernia   . Compression fracture 09/21/2013  . Hepatic encephalopathy syndrome [facility])     Past Surgical History  Procedure Laterality Date  . Orif hip fracture  2010    right x2  . Upper gastrointestinal endoscopy  08/05/2007    esophageal ring, hiatal hernia, portal gastropathy  . Splenectomy      age 55  . Burr hole for subdural hematoma  2004  . Upper gastrointestinal endoscopy  10/14/2011  . Colonoscopy  08/2012    moderatel left colon tics  . Kyphoplasty Bilateral  09/21/2013    Procedure: T12 - L1 KYPHOPLASTY;  Surgeon: Melina Schools, MD;  Location: [place];  Service: Orthopedics;  Laterality: Bilateral;  . Esophagogastroduodenoscopy (egd) with propofol N/A 01/26/2014    Procedure: ESOPHAGOGASTRODUODENOSCOPY (EGD) WITH PROPOFOL;  Surgeon: Lafayette Dragon, MD;  Location: [facility] ENDOSCOPY;  Service: Endoscopy;  Laterality: N/A;    There were no vitals filed for this visit.  Visit Diagnosis:  Abnormality of gait  Weakness generalized  Stiffness due to immobility  Unsteadiness  Difficulty walking  Lack of coordination  Impaired transfers  Balance problems      Subjective Assessment - 09/23/15 1150    Subjective She is using RW to come to PT only.    Currently in Pain? Yes   Pain Score 4    Pain Location Hip   Pain Orientation Right   Pain Descriptors / Indicators Aching;Sore   Pain Type Chronic pain   Pain Onset 1 to 4 weeks ago   Pain Frequency Intermittent                         [facility] Adult PT Treatment/Exercise - 09/23/15 1148    Transfers   Transfers Sit to Stand;Stand to [employer]   Sit to Stand 5: Supervision;With upper  extremity assist;With armrests;From chair/3-in-1  uses back of legs against chair to stabilize   Stand to Sit 5: Supervision;With upper extremity assist;With armrests;To chair/3-in-1;Uncontrolled descent   Ambulation/Gait   Ambulation/Gait Yes   Ambulation/Gait Assistance 5: Supervision   Ambulation/Gait Assistance Details verbal cues on position within RW, posture & safety   Ambulation Distance (Feet) 120 Feet  120' X 2   Assistive device Rolling walker   Gait Pattern Decreased arm swing - right;Decreased arm swing - left;Decreased stride length;Decreased hip/knee flexion - right;Decreased hip/knee flexion - left;Right flexed knee in stance;Left flexed knee in stance;Shuffle;Trunk flexed;Decreased trunk rotation;Wide base of support   Ambulation Surface Indoor;Level   Ramp 4: Min  assist  RW   Ramp Details (indicate cue type and reason) cues on safe RW use /technique   Curb 4: Min assist  [employer] Details (indicate cue type and reason) cues on RW use & technique for safety             Balance Exercises - 09/23/15 1145    OTAGO PROGRAM   Head Movements Sitting;5 reps   Neck Movements Sitting;5 reps   Back Extension Standing;5 reps  walker   Trunk Movements Standing;5 reps  walker   Ankle Movements Sitting;10 reps   Knee Extensor 10 reps  no weight   Knee Flexor 5 reps  walker, no weight   Hip ABductor 5 reps  walker, no weights   Ankle Plantorflexors --  walker, 10 reps   Ankle Dorsiflexors --  walker, 10 reps   Knee Bends 10 reps, support  walker   Backwards Walking Support  RW support   Walking and Turning Around Assistive device  RW support   Heel Walking Support  RW support   Toe Walk Support  RW support   Sit to Stand 10 reps, bilateral support  walker              PT Short Term Goals - 09/10/15 1100    PT SHORT TERM GOAL #1   Title Patient demonstrates understanding of initial HEP to address balance, strength & flexibility. (Target Date 10/11/2015)   Time 1   Period Months   Status New   PT SHORT TERM GOAL #2   Title Patient ambulates 200' with rolling walker with cues to improve safety with community mobility.  (Target Date 10/11/2015)   Time 1   Period Months   Status New   PT SHORT TERM GOAL #3   Title Berg Balance improves to >/= 28/56 to indicate lower fall risk.  (Target Date 10/11/2015)   Time 1   Period Months   Status New   PT SHORT TERM GOAL #4   Title Patient performs floor transfers pushing on mat table with minA to improve ability to get up from floor safely.  (Target Date 10/11/2015)   Time 1   Period Months   Status New           PT Long Term Goals - 09/10/15 1100    PT LONG TERM GOAL #1   Title Verbalize understanding of fall prevention strategies in the home.  (Target Date 11/08/2015)   Time 2    Period Months   Status New   PT LONG TERM GOAL #2   Title Timed Up & Go without device <16sec to indicate lower fall risk.  (Target Date 11/08/2015)   Time 2   Period Months   Status New   PT LONG TERM GOAL #3  Title Patient ambulates around furniture  67' modified indepdent for safe household mobility.  (Target Date 11/08/2015)   Time 2   Period Months   Status New   PT LONG TERM GOAL #4   Title Patient ambulates 500', negotiates ramps & curbs with rolling walker modified independent for safe community access.  (Target Date 11/08/2015)   Time 2   Period Months   Status New   PT LONG TERM GOAL #5   Title Berg Balance test >/= 36/56 to indicate lower fall risk.  (Target Date 11/08/2015)   Time 2   Period Months   Status New   Additional Long Term Goals   Additional Long Term Goals Yes   PT LONG TERM GOAL #6   Title Patient verbalizes understanding of ongoing HEP / fitness plan.  (Target Date 11/08/2015)   Time 2   Period Months   Status New               Plan - 09/23/15 1230    Clinical Impression Statement Patient appears to understand [place] exercises thus far with less muscle fatigue. Patient's gait appears safer with RW.   Pt will benefit from skilled therapeutic intervention in order to improve on the following deficits Abnormal gait;Decreased activity tolerance;Decreased balance;Decreased endurance;Decreased knowledge of precautions;Decreased mobility;Decreased knowledge of use of DME;Decreased strength;Impaired flexibility;Postural dysfunction   Rehab Potential Good   PT Frequency 2x / week   PT Duration Other (comment)  9 weeks (60 days)   PT Treatment/Interventions ADLs/Self Care Home Management;DME Instruction;Gait training;Stair training;Functional mobility training;Therapeutic activities;Therapeutic exercise;Balance training;Neuromuscular re-education;Patient/family education;Vestibular   PT Next Visit Plan continue to review OTAGO ex's until pt is independant  with handout, work on hamstring/DF stretching, safety with walker including barriers   Consulted and Agree with Plan of Care Patient        Problem List Patient Active Problem List   Diagnosis Date Noted  . Chronic low back pain without sciatica 06/28/2015  . OSA (obstructive sleep apnea) 06/12/2015  . Excessive daytime sleepiness 04/11/2015  . Episodic cluster headache, not intractable 04/11/2015  . Obesity, morbid ([place]) 04/11/2015  . Snoring 04/11/2015  . Insomnia 12/27/2014  . Hypothyroidism 09/04/2014  . Cognitive and behavioral changes 07/17/2014  . Confusion 07/17/2014  . History of traumatic brain injury 07/10/2014  . Recurrent falls 06/29/2014  . Osteopenia 04/04/2014  . Esophageal varices in cirrhosis - trace 03/19/2014  . Anemia 02/01/2014  . Chronic kidney disease, unspecified ([place]) 02/01/2014  . Chronic back pain 02/01/2014  . Stricture and stenosis of esophagus 01/26/2014  . Fall at home 01/24/2014  . Near syncope 01/12/2014  . Loss of weight 01/12/2014  . Constipation 11/18/2013  . Compression fracture 09/21/2013  . Cirrhosis ([place]) 09/18/2013  . Atypical chest pain 07/26/2013  . Ascites 07/19/2013  . Burn 07/03/2013  . Dyspnea 02/03/2013  . Abnormal stress echo 01/19/2013  . Tobacco abuse 10/22/2012  . Allergic conjunctivitis 06/19/2012  . Skin lesion 06/19/2012  . Lumbar spondylosis 12/24/2011  . Obesity (BMI 35.0-39.9 without comorbidity) ([place]) 12/08/2011  . Right hip pain 12/01/2011  . Chronic hepatitis C - genotype 1A 10/06/2011  . GERD (gastroesophageal reflux disease) 10/06/2011  . Hepatic encephalopathy syndrome ([place]) 07/28/2011  . Back pain 04/07/2011  . 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 THROMBOCYTOPENIA 10/21/2006  . Depression, major, recurrent, moderate ([place]) 10/21/2006  . HYPERTENSION, BENIGN SYSTEMIC 10/21/2006  . Hepatic cirrhosis ([place]) 10/21/2006  . Generalized pruritus 10/21/2006    Gomer France PT, DPT 09/23/2015,  8:43 PM  [facility] [address] [place], [place], [address] Phone: [phone]   Fax:  [phone]  Name: Wendy [name] MRN: [id number] Date of Birth: 12/08/1960

## 2015-09-23 NOTE — Telephone Encounter (Signed)
Spoke to pt. I advised her that Dr. Jaynee Eagles will not see her either since pt has been discharged from our practice and that she will need to go somewhere else for medical management. I recommended returning to her PCP and asking them to refer her to a new neurology practice. Pt verbalized understanding.

## 2015-09-23 NOTE — Telephone Encounter (Signed)
Pt's roommate, Alexis Frock is not on pt's DPR, and therefore, I cannot speak to him. I called pt's home number and left a message asking her to call me back.  Pt has been dismissed from [facility] for medical noncompliance. Please advise pt of this when/if she calls back.

## 2015-09-23 NOTE — Telephone Encounter (Addendum)
Pt called and states she and her room mate come in today and signed an updated DPR. It has not been scanned into our system yet. The pt wants to know if she can come back into the practice. They stated that they spoke with Marcie Bal today. They are very confused about why pt was dismissed and believe it was a misunderstanding. Please call pt at [phone]

## 2015-09-23 NOTE — Telephone Encounter (Signed)
Spoke to pt. I advised her that Dr. Brett Fairy dismissed her because she was only using her CPAP at 60% compliance and her average use of CPAP was less than 4 hours nightly. She also did not complete her ONO testing as instructed. She took the ONO off before she went to sleep and did not use it with CPAP as directed. Dr. Brett Fairy dismissed her for medical noncompliance. I advised her that she could turn in her CPAP machine to the medical equipment company if she desired. If she is not using it, it might be best to turn it it. Dr. Brett Fairy will no longer be following her for CPAP and will not be providing any orders for her CPAP.  Pt is asking about the EEG Dr. Jaynee Eagles ordered. She is asking if it still should be completed? She had an appt to have it completed but it was cancelled. I advised pt that I would ask Dr. Jaynee Eagles if she still would like pt to complete her EEG.

## 2015-09-23 NOTE — Telephone Encounter (Signed)
Pt's roommate, Alexis Frock, called and feels the pt misunderstood what the physician wanted her to do. He feels that she still would benefit from being a pt here. May call [phone]

## 2015-09-24 NOTE — Telephone Encounter (Signed)
Pt has called again on Monday and Tuesday wanting dr Raliegh Ip to call her

## 2015-09-25 ENCOUNTER — Encounter: Payer: Self-pay | Admitting: Physical Therapy

## 2015-09-25 ENCOUNTER — Other Ambulatory Visit: Payer: Self-pay

## 2015-09-25 ENCOUNTER — Ambulatory Visit: Payer: Medicare Other | Attending: Neurology | Admitting: Physical Therapy

## 2015-09-25 DIAGNOSIS — Z7409 Other reduced mobility: Secondary | ICD-10-CM

## 2015-09-25 DIAGNOSIS — R279 Unspecified lack of coordination: Secondary | ICD-10-CM | POA: Diagnosis not present

## 2015-09-25 DIAGNOSIS — R2689 Other abnormalities of gait and mobility: Secondary | ICD-10-CM

## 2015-09-25 DIAGNOSIS — R2681 Unsteadiness on feet: Secondary | ICD-10-CM | POA: Insufficient documentation

## 2015-09-25 DIAGNOSIS — M623 Immobility syndrome (paraplegic): Secondary | ICD-10-CM | POA: Diagnosis not present

## 2015-09-25 DIAGNOSIS — R29818 Other symptoms and signs involving the nervous system: Secondary | ICD-10-CM | POA: Insufficient documentation

## 2015-09-25 DIAGNOSIS — R2991 Unspecified symptoms and signs involving the musculoskeletal system: Secondary | ICD-10-CM | POA: Diagnosis not present

## 2015-09-25 DIAGNOSIS — M2560 Stiffness of unspecified joint, not elsewhere classified: Secondary | ICD-10-CM

## 2015-09-25 DIAGNOSIS — R269 Unspecified abnormalities of gait and mobility: Secondary | ICD-10-CM | POA: Diagnosis not present

## 2015-09-25 DIAGNOSIS — R531 Weakness: Secondary | ICD-10-CM | POA: Insufficient documentation

## 2015-09-25 DIAGNOSIS — R262 Difficulty in walking, not elsewhere classified: Secondary | ICD-10-CM | POA: Diagnosis not present

## 2015-09-25 NOTE — Telephone Encounter (Signed)
See detailed Telephone Notes from [facility] ([facility]) on 09/19/15 and 09/23/15, there is discussion with office staff and Dr Brett Fairy and Dr Jaynee Eagles in this note. They have discussed this already with patient, and mailed her a letter, which it seems is what she is calling about. The bottom line from their telephone discussion is that she has been dismissed from [facility] from both Dr Jaynee Eagles and Dr Dohmeier due to non-adherence with CPAP treatment. They state that she will need to be referred to a new Neurology Office and a new Sleep Medicine Office to continue this care.  I have attempted to call patient twice today on 09/25/15, I have left her messages stating the above message very briefly, and stated that we will order new referrals however this may take some time to get new appointments and get her established, as I am not familiar with other neurology or sleep medicine offices that would be readily available to her at this time.  [name], [place], PGY-3

## 2015-09-26 ENCOUNTER — Ambulatory Visit (INDEPENDENT_AMBULATORY_CARE_PROVIDER_SITE_OTHER): Payer: Medicare Other | Admitting: Psychiatry

## 2015-09-26 ENCOUNTER — Encounter (HOSPITAL_COMMUNITY): Payer: Self-pay | Admitting: Psychiatry

## 2015-09-26 VITALS — BP 140/80 | HR 67 | Ht 62.0 in | Wt 208.0 lb

## 2015-09-26 DIAGNOSIS — F331 Major depressive disorder, recurrent, moderate: Secondary | ICD-10-CM | POA: Diagnosis not present

## 2015-09-26 DIAGNOSIS — G8929 Other chronic pain: Secondary | ICD-10-CM

## 2015-09-26 DIAGNOSIS — F411 Generalized anxiety disorder: Secondary | ICD-10-CM | POA: Diagnosis not present

## 2015-09-26 DIAGNOSIS — M549 Dorsalgia, unspecified: Secondary | ICD-10-CM

## 2015-09-26 DIAGNOSIS — F0630 Mood disorder due to known physiological condition, unspecified: Secondary | ICD-10-CM | POA: Diagnosis not present

## 2015-09-26 MED ORDER — QUETIAPINE FUMARATE 100 MG PO TABS: 100.0000 mg | ORAL_TABLET | Freq: Every day | ORAL | Status: DC

## 2015-09-26 MED ORDER — ESCITALOPRAM OXALATE 10 MG PO TABS: 10.0000 mg | ORAL_TABLET | Freq: Every day | ORAL | Status: DC

## 2015-09-26 NOTE — Therapy (Signed)
[facility] [address] [place], [place], [address] Phone: [phone]   Fax:  [phone]  Physical Therapy Treatment  Patient Details  Name: SAKAE EULL MRN: [id number] Date of Birth: 09-24-1960 Referring Provider: Sarina Ill, MD  Encounter Date: 09/25/2015      PT End of Session - 09/25/15 1244    Visit Number 6   Number of Visits 18   Date for PT Re-Evaluation 11/08/15   Authorization Type Medicare G-Code & progress report every 10 visits   PT Start Time [id number]   PT Stop Time 1315   PT Time Calculation (min) 40 min   Equipment Utilized During Treatment Gait belt   Activity Tolerance Patient limited by fatigue   Behavior During Therapy Anxious;WFL for tasks assessed/performed      Past Medical History  Diagnosis Date  . Hepatic cirrhosis due to chronic hepatitis C infection ([place])     ETOH causative as well  . Hiatal hernia   . Esophageal stricture     esophageal dysmotility and chronic dysphagia as well  . 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 History of alcohol abuse   . Anxiety and depression   . History of hip fracture   . Allergic rhinitis   . HTN (hypertension)   . Blood transfusion   . Cataract     MILD  . GERD (gastroesophageal reflux disease)   . Osteoporosis   . Ulcer     2007  . Arthritis   . Clotting disorder ([place])     prolonged clotting time due to liver disease  . Paraesophageal hernia   . Compression fracture 09/21/2013  . Hepatic encephalopathy syndrome [facility])     Past Surgical History  Procedure Laterality Date  . Orif hip fracture  2010    right x2  . Upper gastrointestinal endoscopy  08/05/2007    esophageal ring, hiatal hernia, portal gastropathy  . Splenectomy      age 55  . Burr hole for subdural hematoma  2004  . Upper gastrointestinal endoscopy  10/14/2011  . Colonoscopy  08/2012    moderatel left colon tics  . Kyphoplasty Bilateral  09/21/2013    Procedure: T12 - L1 KYPHOPLASTY;  Surgeon: Melina Schools, MD;  Location: [facility];  Service: Orthopedics;  Laterality: Bilateral;  . Esophagogastroduodenoscopy (egd) with propofol N/A 01/26/2014    Procedure: ESOPHAGOGASTRODUODENOSCOPY (EGD) WITH PROPOFOL;  Surgeon: Lafayette Dragon, MD;  Location: [facility] ENDOSCOPY;  Service: Endoscopy;  Laterality: N/A;    There were no vitals filed for this visit.  Visit Diagnosis:  Abnormality of gait  Weakness generalized  Stiffness due to immobility  Unsteadiness  Difficulty walking  Lack of coordination  Impaired transfers  Balance problems      Subjective Assessment - 09/25/15 1238    Subjective Had another fall last night. Was going to kitchen to get a drink of water and fell down in doorway going from bedroom to hallway. Did not have walker or rollator. Did not have any lights on, only the TV. Was able to get herself up. Only injury is a broken fingernail.                                                          Patient Stated Goals  She wants to walk better & get off floor   Currently in Pain? Yes   Pain Score 4    Pain Location Hip   Pain Orientation Right   Pain Descriptors / Indicators Aching;Sore   Pain Type Chronic pain   Pain Onset 1 to 4 weeks ago   Pain Frequency Intermittent   Aggravating Factors  from fall last week   Pain Relieving Factors sleeping, resting             [facility] Adult PT Treatment/Exercise - 09/25/15 1245    Transfers   Transfers Sit to Stand;Stand to [employer] Transfers   Sit to Stand 5: Supervision;With upper extremity assist;From bed   Sit to Stand Details Verbal cues for precautions/safety;Verbal cues for technique;Verbal cues for safe use of DME/AE   Sit to Stand Details (indicate cue type and reason) cues to scoot forward and for hand placement. cues to unlock rollator upon standing   Stand to Sit 5: Supervision;With upper extremity assist;To bed   Stand to Sit Details (indicate cue  type and reason) Verbal cues for technique;Verbal cues for precautions/safety;Verbal cues for safe use of DME/AE   Stand to Sit Details cues to reach back and use UE's to control descent with sitting down after cues to lock rollator   Ambulation/Gait   Ambulation/Gait Yes   Ambulation/Gait Assistance 4: Min guard   Ambulation/Gait Assistance Details cues on posture and walker proximity with gait   Ambulation Distance (Feet) 230 Feet  x1, 120 x1 (plus ramp and curb)   Assistive device Rollator   Gait Pattern Step-through pattern;Decreased stride length;Shuffle;Trunk flexed;Wide base of support;Right flexed knee in stance;Left flexed knee in stance   Ambulation Surface Level;Indoor   Ramp 4: Min assist  with rollator   Ramp Details (indicate cue type and reason) mod multimodal cues on use of rollator, sequencing, and technique.   Curb 4: Min assist  with rollator   Curb Details (indicate cue type and reason) max cueing needed on use of rollator, sequencing and technique iwth 6 inch curb          Balance Exercises - 09/25/15 1310    OTAGO PROGRAM   Head Movements Sitting;5 reps   Neck Movements Sitting;5 reps   Back Extension Standing;5 reps  with walker   Trunk Movements Standing;5 reps  with walker   Ankle Movements Sitting;10 reps   Knee Extensor 10 reps  no weight   Overall OTAGO Comments pt needed max verbal/tactile cues with neck movement for correct ex form/technique along with visual handout;otherwise min verbal/visual cues (notebook and PTA demo),                                     PT Education - 09/25/15 1630    Education Details Reinforced use of walker/rollator at all times to prevent falls. Educated on other fall prevention strategies: not walking barefoot (house shoes with rubber soles, not socks, turn on lights before getting up from bed, use of night lights so to see where light switches are)   Person(s) Educated Patient   Methods Explanation;Verbal cues    Comprehension Verbalized understanding;Returned demonstration;Verbal cues required;Need further instruction          PT Short Term Goals - 09/10/15 1100    PT SHORT TERM GOAL #1   Title Patient demonstrates understanding of initial HEP to address balance, strength & flexibility. (Target Date 10/11/2015)  Time 1   Period Months   Status New   PT SHORT TERM GOAL #2   Title Patient ambulates 200' with rolling walker with cues to improve safety with community mobility.  (Target Date 10/11/2015)   Time 1   Period Months   Status New   PT SHORT TERM GOAL #3   Title Berg Balance improves to >/= 28/56 to indicate lower fall risk.  (Target Date 10/11/2015)   Time 1   Period Months   Status New   PT SHORT TERM GOAL #4   Title Patient performs floor transfers pushing on mat table with minA to improve ability to get up from floor safely.  (Target Date 10/11/2015)   Time 1   Period Months   Status New           PT Long Term Goals - 09/10/15 1100    PT LONG TERM GOAL #1   Title Verbalize understanding of fall prevention strategies in the home.  (Target Date 11/08/2015)   Time 2   Period Months   Status New   PT LONG TERM GOAL #2   Title Timed Up & Go without device <16sec to indicate lower fall risk.  (Target Date 11/08/2015)   Time 2   Period Months   Status New   PT LONG TERM GOAL #3   Title Patient ambulates around furniture  26' modified indepdent for safe household mobility.  (Target Date 11/08/2015)   Time 2   Period Months   Status New   PT LONG TERM GOAL #4   Title Patient ambulates 500', negotiates ramps & curbs with rolling walker modified independent for safe community access.  (Target Date 11/08/2015)   Time 2   Period Months   Status New   PT LONG TERM GOAL #5   Title Berg Balance test >/= 36/56 to indicate lower fall risk.  (Target Date 11/08/2015)   Time 2   Period Months   Status New   Additional Long Term Goals   Additional Long Term Goals Yes   PT LONG TERM  GOAL #6   Title Patient verbalizes understanding of ongoing HEP / fitness plan.  (Target Date 11/08/2015)   Time 2   Period Months   Status New           Plan - 09/25/15 1244    Clinical Impression Statement Primary PT made aware of fall, POC still appropriate. Skilled session focuesed on gait safety with rollator walker that pt just bought this am. Maximal cues for safety and brake use needed through out session. Pt does appear to have increased gait cadence/speed with rollator vs RW without any balance issues noted. Continues to need cues on correct performance of most of the OTAGO ex's. Improved activity tolerance noted today with increased activity performed with less/shorter rest breaks needed.                                        Pt will benefit from skilled therapeutic intervention in order to improve on the following deficits Abnormal gait;Decreased activity tolerance;Decreased balance;Decreased endurance;Decreased knowledge of precautions;Decreased mobility;Decreased knowledge of use of DME;Decreased strength;Impaired flexibility;Postural dysfunction   Rehab Potential Good   PT Frequency 2x / week   PT Duration Other (comment)  9 weeks (60 days)   PT Treatment/Interventions ADLs/Self Care Home Management;DME Instruction;Gait training;Stair training;Functional mobility training;Therapeutic activities;Therapeutic exercise;Balance training;Neuromuscular re-education;Patient/family education;Vestibular  PT Next Visit Plan continue to review OTAGO ex's until pt is independant with handout, work on hamstring/DF stretching, safety with walker including barriers   Consulted and Agree with Plan of Care Patient        Problem List Patient Active Problem List   Diagnosis Date Noted  . Chronic low back pain without sciatica 06/28/2015  . OSA (obstructive sleep apnea) 06/12/2015  . Excessive daytime sleepiness 04/11/2015  . Episodic cluster headache, not intractable 04/11/2015  .  Obesity, morbid ([place]) 04/11/2015  . Snoring 04/11/2015  . Insomnia 12/27/2014  . Hypothyroidism 09/04/2014  . Cognitive and behavioral changes 07/17/2014  . Confusion 07/17/2014  . History of traumatic brain injury 07/10/2014  . Recurrent falls 06/29/2014  . Osteopenia 04/04/2014  . Esophageal varices in cirrhosis - trace 03/19/2014  . Anemia 02/01/2014  . Chronic kidney disease, unspecified ([place]) 02/01/2014  . Chronic back pain 02/01/2014  . Stricture and stenosis of esophagus 01/26/2014  . Fall at home 01/24/2014  . Near syncope 01/12/2014  . Loss of weight 01/12/2014  . Constipation 11/18/2013  . Compression fracture 09/21/2013  . Cirrhosis ([place]) 09/18/2013  . Atypical chest pain 07/26/2013  . Ascites 07/19/2013  . Burn 07/03/2013  . Dyspnea 02/03/2013  . Abnormal stress echo 01/19/2013  . Tobacco abuse 10/22/2012  . Allergic conjunctivitis 06/19/2012  . Skin lesion 06/19/2012  . Lumbar spondylosis 12/24/2011  . Obesity (BMI 35.0-39.9 without comorbidity) ([place]) 12/08/2011  . Right hip pain 12/01/2011  . Chronic hepatitis C - genotype 1A 10/06/2011  . GERD (gastroesophageal reflux disease) 10/06/2011  . Hepatic encephalopathy syndrome ([place]) 07/28/2011  . Back pain 04/07/2011  . 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 THROMBOCYTOPENIA 10/21/2006  . Depression, major, recurrent, moderate ([place]) 10/21/2006  . HYPERTENSION, BENIGN SYSTEMIC 10/21/2006  . Hepatic cirrhosis ([place]) 10/21/2006  . Generalized pruritus 10/21/2006    Willow Ora 09/26/2015, 9:42 AM  Willow Ora, PTA, [facility] [address], [place], [place] [address] [phone] 09/26/2015, 9:42 AM   Name: JERICHA WHITTINGHAM MRN: [id number] Date of Birth: [birth year]

## 2015-09-26 NOTE — Progress Notes (Signed)
Patient ID: Wendy [name], female   DOB: 11/27/1960, 55 y.o.   MRN: [id number]  [facility] Follow up visit  Wendy [name] [id number] 55 y.o.  09/26/2015 12:18 PM  Chief Complaint:  Depression, follow up  History of Present Illness:   Patient Presents for follow-up and medication management for panic disorder, generalized anxiety disorder. Major depressive disorder. 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  Wendy [name] is 55 years old currently single Caucasian female initially referred by Dr. Sheppard Coil for depression has multiple medical conditions including history of broken hip before 4 years ago that exacerbated her symptoms of depression. She has history of hepatitis C which is now cured and  has history of fall, TBI and multiple medical conditions  Mood wise she is not hopeless she has responded somewhat to Lexapro but considering her medical conditions and her limitation she still feels down at times and a motivated. He also cut down the seroquel considering her liver condition. Anxiety: relevant to stress, medical conditions and feeling lonely. Not worsened She has had some memory deficits and is followed by other primary and other providers. With possible memory or cognitive deficits related to traumatic brain injury. Other factors that may be contributing to it may be low thyroid, depression, traumatic brain injury, being on multiple medications and having medical conditions. Aggravating factors; physical health, broken hip 4 years ago. 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  She feels lonely. Multiple medical issues. Her husband died 59 years ago on Valentine's Day.  Modifying factors; she attends the [employer] she tries to go for a walk.  Severity of depression; 5  out of 10. 10 being no depression Duration; more than 10:15 years. Worse in the last 4 years Medical complexity; reviewed records.  She does have hypothyroidism. History of hepatitis C. Hip surgeries. All these conditions can exacerbate her depression.  There is no associated symptoms of psychosis, paranoia. Does not endorse suicidal or homicidal thoughts. There is no history of physical or sexual trauma.    Medical History; Past Medical History  Diagnosis Date  . Hepatic cirrhosis due to chronic hepatitis C infection ([place])     ETOH causative as well  . Hiatal hernia   . Esophageal stricture     esophageal dysmotility and chronic dysphagia as well  . 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 History of alcohol abuse   . Anxiety and depression   . History of hip fracture   . Allergic rhinitis   . HTN (hypertension)   . Blood transfusion   . Cataract     MILD  . GERD (gastroesophageal reflux disease)   . Osteoporosis   . Ulcer     2007  . Arthritis   . Clotting disorder ([place])     prolonged clotting time due to liver disease  . Paraesophageal hernia   . Compression fracture 09/21/2013  . Hepatic encephalopathy syndrome ([facility])     Allergies: Allergies  Allergen Reactions  . Codeine Phosphate Itching  . Codeine Rash    Medications: Outpatient Encounter Prescriptions as of 09/26/2015  Medication Sig  . alendronate (FOSAMAX) 70 MG tablet Take 70 mg by mouth.  . bisacodyl ([employer] GENTLE LAXATIVE) 5 MG EC tablet TAKE 1 TABLET (5 MG TOTAL) BY MOUTH DAILY.  . carvedilol (COREG) 12.5 MG tablet Take 1 tablet (12.5 mg total) by mouth 2 (two) times daily with a meal.  .  CHANTIX 1 MG tablet TAKE 1 TABLET BY MOUTH TWICE A DAY  . Cholecalciferol (VITAMIN D3) 2000 UNITS capsule Take 2,000 Units by mouth daily.  . clonazePAM (KLONOPIN) 1 MG tablet Take 1 tablet (1 mg total) by mouth 2 (two) times daily as needed for anxiety.  . cyclobenzaprine (FLEXERIL) 10 MG tablet TAKE 1/2 TABLET 3 TIMES A DAY AS NEEDED FOR MUSCLE SPASM  . cycloSPORINE (RESTASIS) 0.05 % ophthalmic emulsion Place 1 drop into both  eyes 2 (two) times daily as needed (For dry eyes.).  Marland Kitchen docusate sodium (COLACE) 100 MG capsule TAKE ONE CAPSULE BY MOUTH TWICE A DAY  . Doxepin HCl 5 % CREA Apply 1 application topically 3 (three) times daily as needed (affected area of itching). Use up to 2 weeks at a time then stop.  Marland Kitchen escitalopram (LEXAPRO) 10 MG tablet Take 1 tablet (10 mg total) by mouth daily.  . furosemide (LASIX) 40 MG tablet TAKE 1/2 TABLET EVERY DAY  . gabapentin (NEURONTIN) 600 MG tablet Take 1 tablet (600 mg total) by mouth 3 (three) times daily.  . hydrOXYzine (ATARAX/VISTARIL) 25 MG tablet TAKE 1 TO 2 TABLETS BY MOUTH TWICE A DAY AS NEEDED FOR ITCHING  . lactulose (CHRONULAC) 10 GM/15ML solution Take 45 mLs (30 g total) by mouth 4 (four) times daily as needed (for constipation).  Marland Kitchen levothyroxine (SYNTHROID, LEVOTHROID) 75 MCG tablet Take 1 tablet (75 mcg total) by mouth daily before breakfast.  . loratadine (CLARITIN) 10 MG tablet Take 1 tablet (10 mg total) by mouth daily.  . Multiple Vitamins-Minerals (CENTRUM SILVER ULTRA WOMENS PO) Take by mouth.  . Olopatadine HCl 0.2 % SOLN Place 1 drop into both eyes every morning.   Marland Kitchen omeprazole (PRILOSEC) 20 MG capsule Take 1 capsule (20 mg total) by mouth daily.  . polyethylene glycol (MIRALAX) packet Take 17 g by mouth 2 (two) times daily.  . QUEtiapine (SEROQUEL) 100 MG tablet Take 1 tablet (100 mg total) by mouth at bedtime.  . rifaximin (XIFAXAN) 550 MG TABS tablet Take 1 tablet (550 mg total) by mouth 2 (two) times daily.  Marland Kitchen spironolactone (ALDACTONE) 100 MG tablet TAKE 1 TABLET EVERY DAY  . traMADol (ULTRAM) 50 MG tablet Take 1-2 tablets (50-100 mg total) by mouth every 8 (eight) hours as needed (For pain.).  Marland Kitchen traZODone (DESYREL) 50 MG tablet TAKE 1/2-1 TABLET BY MOUTH AT BEDTIME AS NEEDED FOR SLEEP  . [DISCONTINUED] escitalopram (LEXAPRO) 10 MG tablet Take 1 tablet (10 mg total) by mouth daily.  . [DISCONTINUED] QUEtiapine (SEROQUEL) 200 MG tablet TAKE 1 TABLET BY  MOUTH AT BEDTIME   No facility-administered encounter medications on file as of 09/26/2015.    Family History; Family History  Problem Relation Age of Onset  . Heart disease Mother   . Anxiety disorder Mother   . Colon cancer Neg Hx   . Other Daughter     chromosome abnormalit  . Other Daughter     micropthalmia       Labs:  No results found for this or any previous visit (from the past 2160 hour(s)).     Musculoskeletal: Strength & Muscle Tone: within normal limits Gait & Station: broad based Patient leans: front when standing  Mental Status Examination;   Psychiatric Specialty Exam: Physical Exam  HENT:  Head: Normocephalic.  Skin: She is not diaphoretic.    Review of Systems  Constitutional: Negative for fever.  Respiratory: Negative for cough.   Cardiovascular: Negative for chest pain.  Musculoskeletal:  Positive for back pain.  Skin: Negative for rash.  Neurological: Negative for tremors and headaches.  Psychiatric/Behavioral: Positive for depression. Negative for suicidal ideas and substance abuse.    Blood pressure 140/80, pulse 67, height 5\' 2"  (1.575 m), weight 208 lb (94.348 kg), SpO2 97 %.Body mass index is 38.03 kg/(m^2).  General Appearance: Casual  Eye Contact::  Fair  Speech:  Slow  Volume:  Decreased  Mood: somewhat dysphoric but smiles at times  Affect:  Congruent  Thought Process:  Coherent  Orientation:  Full (Time, Place, and Person)  Thought Content:  Rumination  Suicidal Thoughts:  No  Homicidal Thoughts:  No  Memory:  Immediate;   Fair Recent;   Fair  Judgement:  Fair  Insight:  Shallow  Psychomotor Activity:  Decreased  Concentration:  Fair  Recall:  Fair  Akathisia:  Negative  Handed:  Right  AIMS (if indicated):     Assets:  Desire for Improvement Vocational/Educational  Sleep:        Assessment: Axis I: Maj. depressive disorder recurrent moderate. Generalized anxiety disorder. Panic disorder with agoraphobia. 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 Hepatic cirrhosis due to chronic hepatitis C infection ([place])     ETOH causative as well  . Hiatal hernia   . Esophageal stricture     esophageal dysmotility and chronic dysphagia as well  . 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 History of alcohol abuse   . Anxiety and depression   . History of hip fracture   . Allergic rhinitis   . HTN (hypertension)   . Blood transfusion   . Cataract     MILD  . GERD (gastroesophageal reflux disease)   . Osteoporosis   . Ulcer     2007  . Arthritis   . Clotting disorder ([place])     prolonged clotting time due to liver disease  . Paraesophageal hernia   . Compression fracture 09/21/2013  . Hepatic encephalopathy syndrome ([facility])     Axis IV: Psychosocial. Multiple medical. Loneliness  Treatment Plan and Summary:  Depression: continue lexapro to 10mg  a day  Panic and Anxiety" lexapro as above. recommend slowly to cut down the Seroquel . Will write down for 100mg . She has been on 200mg . Also recommend to cut down the Klonopin considering her cognitive concerns. She understands and will continue close follow up with primary care.  Medical complexity: Follow closely with other providers in regarding to her medical condition including hypothyroidism .  Pertinent Labs and Relevant Prior Notes reviewed. Medication Side effects, benefits and risks reviewed/discussed with Patient. Time given for patient to respond and asks questions regarding the Diagnosis and Medications. Safety concerns and to report to ER if suicidal or call 911. Relevant Medications refilled or called in to pharmacy. Discussed weight maintenance and Sleep Hygiene. Follow up with Primary care provider in regards to Medical conditions. Recommend compliance with medications and follow up  office appointments. Discussed to avail opportunity to consider or/and continue Individual therapy with Counselor. Greater than 50% of time was spend in counseling and coordination of care with the patient.  Schedule for Follow up visit in 8  weeks or call in earlier as necessary. Time spent: 25 minutes  Merian Capron, MD 09/26/2015

## 2015-09-30 ENCOUNTER — Encounter: Payer: Self-pay | Admitting: Physical Therapy

## 2015-09-30 ENCOUNTER — Ambulatory Visit: Payer: Medicare Other | Admitting: Physical Therapy

## 2015-09-30 DIAGNOSIS — M2560 Stiffness of unspecified joint, not elsewhere classified: Secondary | ICD-10-CM

## 2015-09-30 DIAGNOSIS — R279 Unspecified lack of coordination: Secondary | ICD-10-CM

## 2015-09-30 DIAGNOSIS — R2991 Unspecified symptoms and signs involving the musculoskeletal system: Secondary | ICD-10-CM

## 2015-09-30 DIAGNOSIS — R269 Unspecified abnormalities of gait and mobility: Secondary | ICD-10-CM

## 2015-09-30 DIAGNOSIS — M623 Immobility syndrome (paraplegic): Secondary | ICD-10-CM | POA: Diagnosis not present

## 2015-09-30 DIAGNOSIS — R2689 Other abnormalities of gait and mobility: Secondary | ICD-10-CM

## 2015-09-30 DIAGNOSIS — R2681 Unsteadiness on feet: Secondary | ICD-10-CM

## 2015-09-30 DIAGNOSIS — R531 Weakness: Secondary | ICD-10-CM | POA: Diagnosis not present

## 2015-09-30 DIAGNOSIS — R262 Difficulty in walking, not elsewhere classified: Secondary | ICD-10-CM

## 2015-09-30 DIAGNOSIS — Z7409 Other reduced mobility: Secondary | ICD-10-CM

## 2015-09-30 NOTE — Therapy (Signed)
[facility] [address] [place], [place], [address] Phone: [phone]   Fax:  [phone]  Physical Therapy Treatment  Patient Details  Name: Wendy [name] MRN: [id number] Date of Birth: 11/30/1960 Referring Provider: Sarina Ill, MD  Encounter Date: 09/30/2015      PT End of Session - 09/30/15 1346    Visit Number 7   Number of Visits 18   Date for PT Re-Evaluation 11/08/15   Authorization Type Medicare G-Code & progress report every 10 visits   PT Start Time 1204   PT Stop Time 1235   PT Time Calculation (min) 31 min   Equipment Utilized During Treatment Gait belt   Activity Tolerance Patient limited by fatigue   Behavior During Therapy Anxious;WFL for tasks assessed/performed      Past Medical History  Diagnosis Date  . Hepatic cirrhosis due to chronic hepatitis C infection ([place])     ETOH causative as well  . Hiatal hernia   . Esophageal stricture     esophageal dysmotility and chronic dysphagia as well  . 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 History of alcohol abuse   . Anxiety and depression   . History of hip fracture   . Allergic rhinitis   . HTN (hypertension)   . Blood transfusion   . Cataract     MILD  . GERD (gastroesophageal reflux disease)   . Osteoporosis   . Ulcer     2007  . Arthritis   . Clotting disorder ([place])     prolonged clotting time due to liver disease  . Paraesophageal hernia   . Compression fracture 09/21/2013  . Hepatic encephalopathy syndrome [facility])     Past Surgical History  Procedure Laterality Date  . Orif hip fracture  2010    right x2  . Upper gastrointestinal endoscopy  08/05/2007    esophageal ring, hiatal hernia, portal gastropathy  . Splenectomy      age 55  . Burr hole for subdural hematoma  2004  . Upper gastrointestinal endoscopy  10/14/2011  . Colonoscopy  08/2012    moderatel left colon tics  . Kyphoplasty Bilateral  09/21/2013    Procedure: T12 - L1 KYPHOPLASTY;  Surgeon: Melina Schools, MD;  Location: [place];  Service: Orthopedics;  Laterality: Bilateral;  . Esophagogastroduodenoscopy (egd) with propofol N/A 01/26/2014    Procedure: ESOPHAGOGASTRODUODENOSCOPY (EGD) WITH PROPOFOL;  Surgeon: Lafayette Dragon, MD;  Location[facility];  Service: Endoscopy;  Laterality: N/A;    There were no vitals filed for this visit.  Visit Diagnosis:  Abnormality of gait  Weakness generalized  Stiffness due to immobility  Unsteadiness  Difficulty walking  Lack of coordination  Impaired transfers  Balance problems      Subjective Assessment - 09/30/15 1207    Subjective She reports one fall in [place] when using shopping cart and legs fatigued. Denies injuries.    Currently in Pain? Yes   Pain Score 7    Pain Location Hip   Pain Orientation Right   Pain Descriptors / Indicators Aching;Sore   Pain Type Chronic pain   Pain Onset More than a month ago   Pain Frequency Intermittent   Aggravating Factors  from falls   Pain Relieving Factors sleeping, resting     Gait Training with rollator walker: PT demonstrated with verbal instruction use of rollator including brake use with sit to/from stand, hand position on brakes while ambulating and position within  RW, sitting & standing from rollator seat, use of rollator technique on ramp & curb. Patient return demo of above instructions.                             PT Education - 09/30/15 1345    Education provided Yes   Education Details safe use of rollator walker. Using rollator or RW to build conditioning first before worrying about "walking normal"   Person(s) Educated Patient   Methods Explanation;Demonstration;Verbal cues   Comprehension Verbalized understanding;Returned demonstration;Verbal cues required;Need further instruction          PT Short Term Goals - 09/10/15 1100    PT SHORT TERM GOAL #1   Title Patient  demonstrates understanding of initial HEP to address balance, strength & flexibility. (Target Date 10/11/2015)   Time 1   Period Months   Status New   PT SHORT TERM GOAL #2   Title Patient ambulates 200' with rolling walker with cues to improve safety with community mobility.  (Target Date 10/11/2015)   Time 1   Period Months   Status New   PT SHORT TERM GOAL #3   Title Berg Balance improves to >/= 28/56 to indicate lower fall risk.  (Target Date 10/11/2015)   Time 1   Period Months   Status New   PT SHORT TERM GOAL #4   Title Patient performs floor transfers pushing on mat table with minA to improve ability to get up from floor safely.  (Target Date 10/11/2015)   Time 1   Period Months   Status New           PT Long Term Goals - 09/10/15 1100    PT LONG TERM GOAL #1   Title Verbalize understanding of fall prevention strategies in the home.  (Target Date 11/08/2015)   Time 2   Period Months   Status New   PT LONG TERM GOAL #2   Title Timed Up & Go without device <16sec to indicate lower fall risk.  (Target Date 11/08/2015)   Time 2   Period Months   Status New   PT LONG TERM GOAL #3   Title Patient ambulates around furniture  75' modified indepdent for safe household mobility.  (Target Date 11/08/2015)   Time 2   Period Months   Status New   PT LONG TERM GOAL #4   Title Patient ambulates 500', negotiates ramps & curbs with rolling walker modified independent for safe community access.  (Target Date 11/08/2015)   Time 2   Period Months   Status New   PT LONG TERM GOAL #5   Title Berg Balance test >/= 36/56 to indicate lower fall risk.  (Target Date 11/08/2015)   Time 2   Period Months   Status New   Additional Long Term Goals   Additional Long Term Goals Yes   PT LONG TERM GOAL #6   Title Patient verbalizes understanding of ongoing HEP / fitness plan.  (Target Date 11/08/2015)   Time 2   Period Months   Status New               Plan - 09/30/15 1347     Clinical Impression Statement Patient appears to understand how to use rollator safely but will need multiple repettions due to her cognitive deficits.    Pt will benefit from skilled therapeutic intervention in order to improve on the following deficits Abnormal gait;Decreased activity tolerance;Decreased  balance;Decreased endurance;Decreased knowledge of precautions;Decreased mobility;Decreased knowledge of use of DME;Decreased strength;Impaired flexibility;Postural dysfunction   Rehab Potential Good   PT Frequency 2x / week   PT Duration Other (comment)  9 weeks (60 days)   PT Treatment/Interventions ADLs/Self Care Home Management;DME Instruction;Gait training;Stair training;Functional mobility training;Therapeutic activities;Therapeutic exercise;Balance training;Neuromuscular re-education;Patient/family education;Vestibular   PT Next Visit Plan continue to review OTAGO ex's until pt is independant with handout, work on hamstring/DF stretching, safety with walker including barriers   Consulted and Agree with Plan of Care Patient        Problem List Patient Active Problem List   Diagnosis Date Noted  . Chronic low back pain without sciatica 06/28/2015  . OSA (obstructive sleep apnea) 06/12/2015  . Excessive daytime sleepiness 04/11/2015  . Episodic cluster headache, not intractable 04/11/2015  . Obesity, morbid ([place]) 04/11/2015  . Snoring 04/11/2015  . Insomnia 12/27/2014  . Hypothyroidism 09/04/2014  . Cognitive and behavioral changes 07/17/2014  . Confusion 07/17/2014  . History of traumatic brain injury 07/10/2014  . Recurrent falls 06/29/2014  . Osteopenia 04/04/2014  . Esophageal varices in cirrhosis - trace 03/19/2014  . Anemia 02/01/2014  . Chronic kidney disease, unspecified ([place]) 02/01/2014  . Chronic back pain 02/01/2014  . Stricture and stenosis of esophagus 01/26/2014  . Fall at home 01/24/2014  . Near syncope 01/12/2014  . Loss of weight 01/12/2014  . Constipation  11/18/2013  . Compression fracture 09/21/2013  . Cirrhosis ([place]) 09/18/2013  . Atypical chest pain 07/26/2013  . Ascites 07/19/2013  . Burn 07/03/2013  . Dyspnea 02/03/2013  . Abnormal stress echo 01/19/2013  . Tobacco abuse 10/22/2012  . Allergic conjunctivitis 06/19/2012  . Skin lesion 06/19/2012  . Lumbar spondylosis 12/24/2011  . Obesity (BMI 35.0-39.9 without comorbidity) ([place]) 12/08/2011  . Right hip pain 12/01/2011  . Chronic hepatitis C - genotype 1A 10/06/2011  . GERD (gastroesophageal reflux disease) 10/06/2011  . Hepatic encephalopathy syndrome ([place]) 07/28/2011  . Back pain 04/07/2011  . 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 THROMBOCYTOPENIA 10/21/2006  . Depression, major, recurrent, moderate ([place]) 10/21/2006  . HYPERTENSION, BENIGN SYSTEMIC 10/21/2006  . Hepatic cirrhosis ([place]) 10/21/2006  . Generalized pruritus 10/21/2006    Jamey Reas PT, DPT 09/30/2015, 1:50 PM  [facility] [address] [place], [place], [address] Phone: [phone]   Fax:  [phone]  Name: GEORGANNE SLAVEY MRN: [id number] Date of Birth: May 15, 1961

## 2015-10-01 ENCOUNTER — Telehealth: Payer: Self-pay | Admitting: Internal Medicine

## 2015-10-01 NOTE — Telephone Encounter (Signed)
Patient is not due to [place] until mid may.  She is encouraged to keep her appt with Dr. Carlean Purl for 2/23

## 2015-10-02 ENCOUNTER — Ambulatory Visit: Payer: Medicare Other | Admitting: Physical Therapy

## 2015-10-02 ENCOUNTER — Encounter: Payer: Self-pay | Admitting: Physical Therapy

## 2015-10-02 DIAGNOSIS — R2681 Unsteadiness on feet: Secondary | ICD-10-CM

## 2015-10-02 DIAGNOSIS — R279 Unspecified lack of coordination: Secondary | ICD-10-CM

## 2015-10-02 DIAGNOSIS — R2689 Other abnormalities of gait and mobility: Secondary | ICD-10-CM

## 2015-10-02 DIAGNOSIS — R269 Unspecified abnormalities of gait and mobility: Secondary | ICD-10-CM | POA: Diagnosis not present

## 2015-10-02 DIAGNOSIS — R262 Difficulty in walking, not elsewhere classified: Secondary | ICD-10-CM

## 2015-10-02 DIAGNOSIS — R2991 Unspecified symptoms and signs involving the musculoskeletal system: Secondary | ICD-10-CM

## 2015-10-02 DIAGNOSIS — Z7409 Other reduced mobility: Secondary | ICD-10-CM

## 2015-10-02 DIAGNOSIS — M2560 Stiffness of unspecified joint, not elsewhere classified: Secondary | ICD-10-CM

## 2015-10-02 DIAGNOSIS — M623 Immobility syndrome (paraplegic): Secondary | ICD-10-CM | POA: Diagnosis not present

## 2015-10-02 DIAGNOSIS — R531 Weakness: Secondary | ICD-10-CM

## 2015-10-02 NOTE — Therapy (Signed)
[facility] [place] [facility] [address] [place], [place], [address] Phone: [phone]   Fax:  [phone]  Physical Therapy Treatment  Patient Details  Name: Wendy [name] MRN: [id number] Date of Birth: Nov 26, 1960 Referring Provider: Sarina Ill, MD  Encounter Date: 10/02/2015      PT End of Session - 10/02/15 1324    Visit Number 8   Number of Visits 18   Date for PT Re-Evaluation 11/08/15   Authorization Type Medicare G-Code & progress report every 10 visits   PT Start Time 1318   PT Stop Time 1400   PT Time Calculation (min) 42 min   Equipment Utilized During Treatment Gait belt   Activity Tolerance Patient limited by fatigue   Behavior During Therapy Anxious;WFL for tasks assessed/performed      Past Medical History  Diagnosis Date  . Hepatic cirrhosis due to chronic hepatitis C infection ([place])     ETOH causative as well  . Hiatal hernia   . Esophageal stricture     esophageal dysmotility and chronic dysphagia as well  . 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 History of alcohol abuse   . Anxiety and depression   . History of hip fracture   . Allergic rhinitis   . HTN (hypertension)   . Blood transfusion   . Cataract     MILD  . GERD (gastroesophageal reflux disease)   . Osteoporosis   . Ulcer     2007  . Arthritis   . Clotting disorder ([place])     prolonged clotting time due to liver disease  . Paraesophageal hernia   . Compression fracture 09/21/2013  . Hepatic encephalopathy syndrome [facility])     Past Surgical History  Procedure Laterality Date  . Orif hip fracture  2010    right x2  . Upper gastrointestinal endoscopy  08/05/2007    esophageal ring, hiatal hernia, portal gastropathy  . Splenectomy      age 46  . Burr hole for subdural hematoma  2004  . Upper gastrointestinal endoscopy  10/14/2011  . Colonoscopy  08/2012    moderatel left colon tics  . Kyphoplasty Bilateral  09/21/2013    Procedure: T12 - L1 KYPHOPLASTY;  Surgeon: Melina Schools, MD;  Location: [place];  Service: Orthopedics;  Laterality: Bilateral;  . Esophagogastroduodenoscopy (egd) with propofol N/A 01/26/2014    Procedure: ESOPHAGOGASTRODUODENOSCOPY (EGD) WITH PROPOFOL;  Surgeon: Lafayette Dragon, MD;  Location: [facility] ENDOSCOPY;  Service: Endoscopy;  Laterality: N/A;    There were no vitals filed for this visit.  Visit Diagnosis:  Abnormality of gait  Weakness generalized  Stiffness due to immobility  Unsteadiness  Difficulty walking  Balance problems  Impaired transfers  Lack of coordination      Subjective Assessment - 10/02/15 1322    Subjective No new falls to report. No change in hip pain.   Patient Stated Goals She wants to walk better & get off floor   Currently in Pain? Yes   Pain Score 4    Pain Location Hip   Pain Orientation Right   Pain Descriptors / Indicators Aching;Sore   Pain Type Chronic pain   Pain Onset More than a month ago   Pain Frequency Intermittent   Aggravating Factors  getting up from falling   Pain Relieving Factors sleeping, resting, tramadol on occasion            [facility] Adult PT Treatment/Exercise - 10/02/15 1326  Transfers   Transfers Sit to Stand;Stand to [employer] Transfers   Sit to Stand 5: Supervision;With upper extremity assist;From bed   Sit to Stand Details Verbal cues for precautions/safety;Verbal cues for technique;Verbal cues for safe use of DME/AE   Stand to Sit 5: Supervision;With upper extremity assist;To bed   Stand to Sit Details (indicate cue type and reason) Verbal cues for technique;Verbal cues for precautions/safety;Verbal cues for safe use of DME/AE   Ambulation/Gait   Ambulation/Gait Yes   Ambulation/Gait Assistance 4: Min guard   Ambulation/Gait Assistance Details cues on posture, walker proximity with gait and on equal step length. cues on hand position on handles on rollator for safety with gait.   Ambulation  Distance (Feet) 210 Feet  x 2 reps   Assistive device Rollator   Gait Pattern Step-through pattern;Decreased stride length;Shuffle;Trunk flexed;Wide base of support;Right flexed knee in stance;Left flexed knee in stance   Ambulation Surface Level;Indoor   Ramp 4: Min assist  with rollator   Ramp Details (indicate cue type and reason) moderate cues needed on sequencing, posture and technique with rollator   Curb 4: Min assist  with rollator   Curb Details (indicate cue type and reason) mod cues needed on sequencing and technique             Balance Exercises - 10/02/15 1340    OTAGO PROGRAM   Knee Extensor 10 reps  no weight   Knee Flexor 5 reps  rollator, no weight   Hip ABductor 5 reps  rollator, no weight   Ankle Plantorflexors --  rollator x 10 reps   Ankle Dorsiflexors --  rollator x 10 reps   Knee Bends 10 reps, support   Backwards Walking Support  with rollator   Walking and Turning Around Assistive device  with rollator   Heel Walking Support  with rollator   Toe Walk Support  with rollator   Sit to Stand 10 reps, bilateral support   Overall OTAGO Comments minimal verbal cues needed along with  handout for correct posture and ex form             PT Short Term Goals - 09/10/15 1100    PT SHORT TERM GOAL #1   Title Patient demonstrates understanding of initial HEP to address balance, strength & flexibility. (Target Date 10/11/2015)   Time 1   Period Months   Status New   PT SHORT TERM GOAL #2   Title Patient ambulates 200' with rolling walker with cues to improve safety with community mobility.  (Target Date 10/11/2015)   Time 1   Period Months   Status New   PT SHORT TERM GOAL #3   Title Berg Balance improves to >/= 28/56 to indicate lower fall risk.  (Target Date 10/11/2015)   Time 1   Period Months   Status New   PT SHORT TERM GOAL #4   Title Patient performs floor transfers pushing on mat table with minA to improve ability to get up from floor  safely.  (Target Date 10/11/2015)   Time 1   Period Months   Status New           PT Long Term Goals - 09/10/15 1100    PT LONG TERM GOAL #1   Title Verbalize understanding of fall prevention strategies in the home.  (Target Date 11/08/2015)   Time 2   Period Months   Status New   PT LONG TERM GOAL #2   Title Timed Up &  Go without device <16sec to indicate lower fall risk.  (Target Date 11/08/2015)   Time 2   Period Months   Status New   PT LONG TERM GOAL #3   Title Patient ambulates around furniture  38' modified indepdent for safe household mobility.  (Target Date 11/08/2015)   Time 2   Period Months   Status New   PT LONG TERM GOAL #4   Title Patient ambulates 500', negotiates ramps & curbs with rolling walker modified independent for safe community access.  (Target Date 11/08/2015)   Time 2   Period Months   Status New   PT LONG TERM GOAL #5   Title Berg Balance test >/= 36/56 to indicate lower fall risk.  (Target Date 11/08/2015)   Time 2   Period Months   Status New   Additional Long Term Goals   Additional Long Term Goals Yes   PT LONG TERM GOAL #6   Title Patient verbalizes understanding of ongoing HEP / fitness plan.  (Target Date 11/08/2015)   Time 2   Period Months   Status New           Plan - 10/02/15 1324    Clinical Impression Statement Pt continues to need cues on use of rollator (proximity with gait, brake use, hand position on handles) throughout session. Pt as able to demo OTAGO ex's with handouts with minimal cues on technique/ex form today. Pt is making steady progress toward goals.   Pt will benefit from skilled therapeutic intervention in order to improve on the following deficits Abnormal gait;Decreased activity tolerance;Decreased balance;Decreased endurance;Decreased knowledge of precautions;Decreased mobility;Decreased knowledge of use of DME;Decreased strength;Impaired flexibility;Postural dysfunction   Rehab Potential Good   PT Frequency 2x /  week   PT Duration Other (comment)  9 weeks (60 days)   PT Treatment/Interventions ADLs/Self Care Home Management;DME Instruction;Gait training;Stair training;Functional mobility training;Therapeutic activities;Therapeutic exercise;Balance training;Neuromuscular re-education;Patient/family education;Vestibular   PT Next Visit Plan continue to review OTAGO ex's until pt is independant with handout, work on hamstring/DF stretching, safety with walker including barriers;initiate standing balance as able   Consulted and Agree with Plan of Care Patient        Problem List Patient Active Problem List   Diagnosis Date Noted  . Chronic low back pain without sciatica 06/28/2015  . OSA (obstructive sleep apnea) 06/12/2015  . Excessive daytime sleepiness 04/11/2015  . Episodic cluster headache, not intractable 04/11/2015  . Obesity, morbid ([place]) 04/11/2015  . Snoring 04/11/2015  . Insomnia 12/27/2014  . Hypothyroidism 09/04/2014  . Cognitive and behavioral changes 07/17/2014  . Confusion 07/17/2014  . History of traumatic brain injury 07/10/2014  . Recurrent falls 06/29/2014  . Osteopenia 04/04/2014  . Esophageal varices in cirrhosis - trace 03/19/2014  . Anemia 02/01/2014  . Chronic kidney disease, unspecified ([place]) 02/01/2014  . Chronic back pain 02/01/2014  . Stricture and stenosis of esophagus 01/26/2014  . Fall at home 01/24/2014  . Near syncope 01/12/2014  . Loss of weight 01/12/2014  . Constipation 11/18/2013  . Compression fracture 09/21/2013  . Cirrhosis ([place]) 09/18/2013  . Atypical chest pain 07/26/2013  . Ascites 07/19/2013  . Burn 07/03/2013  . Dyspnea 02/03/2013  . Abnormal stress echo 01/19/2013  . Tobacco abuse 10/22/2012  . Allergic conjunctivitis 06/19/2012  . Skin lesion 06/19/2012  . Lumbar spondylosis 12/24/2011  . Obesity (BMI 35.0-39.9 without comorbidity) ([place]) 12/08/2011  . Right hip pain 12/01/2011  . Chronic hepatitis C - genotype 1A 10/06/2011  . GERD  (  gastroesophageal reflux disease) 10/06/2011  . Hepatic encephalopathy syndrome ([place]) 07/28/2011  . Back pain 04/07/2011  . 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 THROMBOCYTOPENIA 10/21/2006  . Depression, major, recurrent, moderate ([place]) 10/21/2006  . HYPERTENSION, BENIGN SYSTEMIC 10/21/2006  . Hepatic cirrhosis ([place]) 10/21/2006  . Generalized pruritus 10/21/2006    Willow Ora 10/03/2015, 12:54 PM  Willow Ora, PTA, [facility] [address], [place], [place] [address] [phone] 10/03/2015, 12:54 PM   Name: SCOTLYN GOTSHALL MRN: [id number] Date of Birth: [birth year]

## 2015-10-07 ENCOUNTER — Encounter: Payer: Self-pay | Admitting: Physical Therapy

## 2015-10-07 ENCOUNTER — Ambulatory Visit: Payer: Medicare Other | Admitting: Physical Therapy

## 2015-10-07 DIAGNOSIS — R269 Unspecified abnormalities of gait and mobility: Secondary | ICD-10-CM | POA: Diagnosis not present

## 2015-10-07 DIAGNOSIS — R2991 Unspecified symptoms and signs involving the musculoskeletal system: Secondary | ICD-10-CM

## 2015-10-07 DIAGNOSIS — R2681 Unsteadiness on feet: Secondary | ICD-10-CM | POA: Diagnosis not present

## 2015-10-07 DIAGNOSIS — M2560 Stiffness of unspecified joint, not elsewhere classified: Secondary | ICD-10-CM

## 2015-10-07 DIAGNOSIS — R262 Difficulty in walking, not elsewhere classified: Secondary | ICD-10-CM | POA: Diagnosis not present

## 2015-10-07 DIAGNOSIS — M623 Immobility syndrome (paraplegic): Secondary | ICD-10-CM | POA: Diagnosis not present

## 2015-10-07 DIAGNOSIS — R531 Weakness: Secondary | ICD-10-CM | POA: Diagnosis not present

## 2015-10-07 DIAGNOSIS — R2689 Other abnormalities of gait and mobility: Secondary | ICD-10-CM

## 2015-10-07 DIAGNOSIS — Z7409 Other reduced mobility: Secondary | ICD-10-CM

## 2015-10-07 DIAGNOSIS — R279 Unspecified lack of coordination: Secondary | ICD-10-CM | POA: Diagnosis not present

## 2015-10-08 ENCOUNTER — Other Ambulatory Visit (HOSPITAL_COMMUNITY): Payer: Self-pay | Admitting: Psychiatry

## 2015-10-08 NOTE — Therapy (Signed)
[facility] [address] [place], [place], [address] Phone: [phone]   Fax:  [phone]  Physical Therapy Treatment  Patient Details  Name: Wendy [name] MRN: [id number] Date of Birth: 01-05-1961 Referring Provider: Sarina Ill, MD  Encounter Date: 10/07/2015   10/07/15 1327  PT Visits / Re-Eval  Visit Number 9  Number of Visits 18  Date for PT Re-Evaluation 11/08/15  Authorization  Authorization Type Medicare G-Code & progress report every 10 visits  PT Time Calculation  PT Start Time 1320  PT Stop Time 1400  PT Time Calculation (min) 40 min  PT - End of Session  Equipment Utilized During Treatment Gait belt  Activity Tolerance Patient limited by fatigue  Behavior During Therapy Anxious;WFL for tasks assessed/performed     Past Medical History  Diagnosis Date  . Hepatic cirrhosis due to chronic hepatitis C infection ([place])     ETOH causative as well  . Hiatal hernia   . Esophageal stricture     esophageal dysmotility and chronic dysphagia as well  . 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 History of alcohol abuse   . Anxiety and depression   . History of hip fracture   . Allergic rhinitis   . HTN (hypertension)   . Blood transfusion   . Cataract     MILD  . GERD (gastroesophageal reflux disease)   . Osteoporosis   . Ulcer     2007  . Arthritis   . Clotting disorder ([place])     prolonged clotting time due to liver disease  . Paraesophageal hernia   . Compression fracture 09/21/2013  . Hepatic encephalopathy syndrome [facility])     Past Surgical History  Procedure Laterality Date  . Orif hip fracture  2010    right x2  . Upper gastrointestinal endoscopy  08/05/2007    esophageal ring, hiatal hernia, portal gastropathy  . 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 Burr hole for subdural hematoma  2004  . Upper gastrointestinal endoscopy  10/14/2011  . Colonoscopy  08/2012   moderatel left colon tics  . Kyphoplasty Bilateral 09/21/2013    Procedure: T12 - L1 KYPHOPLASTY;  Surgeon: Melina Schools, MD;  Location: [facility];  Service: Orthopedics;  Laterality: Bilateral;  . Esophagogastroduodenoscopy (egd) with propofol N/A 01/26/2014    Procedure: ESOPHAGOGASTRODUODENOSCOPY (EGD) WITH PROPOFOL;  Surgeon: Lafayette Dragon, MD;  Location[facility] ENDOSCOPY;  Service: Endoscopy;  Laterality: N/A;    There were no vitals filed for this visit.  Visit Diagnosis:  Abnormality of gait  Weakness generalized  Stiffness due to immobility  Unsteadiness  Difficulty walking  Balance problems  Impaired transfers   10/07/15 1326  Symptoms/Limitations  Subjective No new falls to report. Now with pain up into her lower back on the right side, plus still in the hip.   Patient Stated Goals She wants to walk better & get off floor  Pain Assessment  Currently in Pain? Yes  Pain Score 3  Pain Location Hip  Pain Orientation Right  Pain Descriptors / Indicators Aching;Sore  Pain Type Chronic pain  Pain Radiating Towards up into her lower back  Pain Onset More than a month ago  Pain Frequency Intermittent  Aggravating Factors  increased actvity to increased immobility  Pain Relieving Factors tramadol, sleeping , resting     10/07/15 1328  Transfers  Transfers Sit to Stand;Stand to [employer] Transfers  Sit to Stand 5:  Supervision;With upper extremity assist;From bed  Sit to Stand Details Verbal cues for safe use of DME/AE  Stand to Sit 5: Supervision;With upper extremity assist;To bed  Stand to Sit Details (indicate cue type and reason) Verbal cues for safe use of DME/AE  Floor to Transfer 4: Min guard;4: Min assist;With upper extremity assist  Floor to Transfer Details (indicate cue type and reason) standing>floor>standing transfer next to mat table. demo'd technique prior to pt performance. min assist with step by step cues on sequencing/technique for getting down to mat and  back up with UE support on mat table.  Ambulation/Gait  Ambulation/Gait Yes  Ambulation/Gait Assistance 5: Supervision  Ambulation/Gait Assistance Details occasional cues on hand position on brakes/handles of rollator. improved step length and foot clearance with gait today.                         Ambulation Distance (Feet) 210 Feet  Assistive device Rollator  Gait Pattern Step-through pattern;Decreased stride length;Trunk flexed;Wide base of support;Right flexed knee in stance;Left flexed knee in stance  Ambulation Surface Level;Indoor  Berg Balance Test  Sit to Stand 4  Standing Unsupported 4  Sitting with Back Unsupported but Feet Supported on Floor or Stool 4  Stand to Sit 3  Transfers 3  Standing Unsupported with Eyes Closed 4  Standing Ubsupported with Feet Together 3  From Standing, Reach Forward with Outstretched Arm 3 (8 inches)  From Standing Position, Pick up Object from Floor 4  From Standing Position, Turn to Look Behind Over each Shoulder 3 (left > right)  Turn 360 Degrees 2 (> 7 seconds both ways)  Standing Unsupported, Alternately Place Feet on Step/Stool 3  Standing Unsupported, One Foot in Front 3  Standing on One Leg 1  Total Score 44            PT Short Term Goals - 10/07/15 1334    PT SHORT TERM GOAL #1   Title Patient demonstrates understanding of initial HEP to address balance, strength & flexibility. (Target Date 10/11/2015)   Time 1   Period Months   Status New   PT SHORT TERM GOAL #2   Title Patient ambulates 200' with rolling walker with cues to improve safety with community mobility.  (Target Date 10/11/2015)   Baseline met on 10/07/15   Status Achieved   PT SHORT TERM GOAL #3   Title Berg Balance improves to >/= 28/56 to indicate lower fall risk.  (Target Date 10/11/2015)   Baseline 10/07/15: scored 44/56   Time --   Period --   Status Achieved   PT SHORT TERM GOAL #4   Title Patient performs floor transfers pushing on mat table with minA  to improve ability to get up from floor safely.  (Target Date 10/11/2015)   Baseline met on 10/07/15   Status Achieved           PT Long Term Goals - 09/10/15 1100    PT LONG TERM GOAL #1   Title Verbalize understanding of fall prevention strategies in the home.  (Target Date 11/08/2015)   Time 2   Period Months   Status New   PT LONG TERM GOAL #2   Title Timed Up & Go without device <16sec to indicate lower fall risk.  (Target Date 11/08/2015)   Time 2   Period Months   Status New   PT LONG TERM GOAL #3   Title Patient ambulates around furniture  25' modified  indepdent for safe household mobility.  (Target Date 11/08/2015)   Time 2   Period Months   Status New   PT LONG TERM GOAL #4   Title Patient ambulates 500', negotiates ramps & curbs with rolling walker modified independent for safe community access.  (Target Date 11/08/2015)   Time 2   Period Months   Status New   PT LONG TERM GOAL #5   Title Berg Balance test >/= 36/56 to indicate lower fall risk.  (Target Date 11/08/2015)   Time 2   Period Months   Status New   Additional Long Term Goals   Additional Long Term Goals Yes   PT LONG TERM GOAL #6   Title Patient verbalizes understanding of ongoing HEP / fitness plan.  (Target Date 11/08/2015)   Time 2   Period Months   Status New        10/07/15 1328  Plan  Clinical Impression Statement Pt has met some STGs and is progressing toward her other goals.   Pt will benefit from skilled therapeutic intervention in order to improve on the following deficits Abnormal gait;Decreased activity tolerance;Decreased balance;Decreased endurance;Decreased knowledge of precautions;Decreased mobility;Decreased knowledge of use of DME;Decreased strength;Impaired flexibility;Postural dysfunction  Rehab Potential Good  PT Frequency 2x / week  PT Duration Other (comment) (9 weeks (60 days))  PT Treatment/Interventions ADLs/Self Care Home Management;DME Instruction;Gait training;Stair  training;Functional mobility training;Therapeutic activities;Therapeutic exercise;Balance training;Neuromuscular re-education;Patient/family education;Vestibular  PT Next Visit Plan assess remaining STGs and G-coded/progress note due next visit  Consulted and Agree with Plan of Care Patient     Problem List Patient Active Problem List   Diagnosis Date Noted  . Chronic low back pain without sciatica 06/28/2015  . OSA (obstructive sleep apnea) 06/12/2015  . Excessive daytime sleepiness 04/11/2015  . Episodic cluster headache, not intractable 04/11/2015  . Obesity, morbid ([place]) 04/11/2015  . Snoring 04/11/2015  . Insomnia 12/27/2014  . Hypothyroidism 09/04/2014  . Cognitive and behavioral changes 07/17/2014  . Confusion 07/17/2014  . History of traumatic brain injury 07/10/2014  . Recurrent falls 06/29/2014  . Osteopenia 04/04/2014  . Esophageal varices in cirrhosis - trace 03/19/2014  . Anemia 02/01/2014  . Chronic kidney disease, unspecified ([place]) 02/01/2014  . Chronic back pain 02/01/2014  . Stricture and stenosis of esophagus 01/26/2014  . Fall at home 01/24/2014  . Near syncope 01/12/2014  . Loss of weight 01/12/2014  . Constipation 11/18/2013  . Compression fracture 09/21/2013  . Cirrhosis ([place]) 09/18/2013  . Atypical chest pain 07/26/2013  . Ascites 07/19/2013  . Burn 07/03/2013  . Dyspnea 02/03/2013  . Abnormal stress echo 01/19/2013  . Tobacco abuse 10/22/2012  . Allergic conjunctivitis 06/19/2012  . Skin lesion 06/19/2012  . Lumbar spondylosis 12/24/2011  . Obesity (BMI 35.0-39.9 without comorbidity) ([place]) 12/08/2011  . Right hip pain 12/01/2011  . Chronic hepatitis C - genotype 1A 10/06/2011  . GERD (gastroesophageal reflux disease) 10/06/2011  . Hepatic encephalopathy syndrome ([place]) 07/28/2011  . Back pain 04/07/2011  . 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 THROMBOCYTOPENIA 10/21/2006  . Depression, major, recurrent, moderate ([place]) 10/21/2006  .  HYPERTENSION, BENIGN SYSTEMIC 10/21/2006  . Hepatic cirrhosis ([place]) 10/21/2006  . Generalized pruritus 10/21/2006    Wendy [name] 10/08/2015, 10:25 PM  Wendy [name], PTA, [facility] [address] [place], [place] [address] [phone] 10/08/2015, 10:25 PM  Name: Wendy [name] MRN: [id number] Date of Birth: September 25, 1960

## 2015-10-09 ENCOUNTER — Ambulatory Visit: Payer: Medicare Other | Admitting: Physical Therapy

## 2015-10-10 ENCOUNTER — Other Ambulatory Visit: Payer: Self-pay | Admitting: Family Medicine

## 2015-10-10 DIAGNOSIS — K7460 Unspecified cirrhosis of liver: Secondary | ICD-10-CM

## 2015-10-14 ENCOUNTER — Ambulatory Visit: Payer: Medicare Other | Admitting: Physical Therapy

## 2015-10-14 DIAGNOSIS — R269 Unspecified abnormalities of gait and mobility: Secondary | ICD-10-CM

## 2015-10-14 DIAGNOSIS — R262 Difficulty in walking, not elsewhere classified: Secondary | ICD-10-CM

## 2015-10-14 DIAGNOSIS — R2681 Unsteadiness on feet: Secondary | ICD-10-CM | POA: Diagnosis not present

## 2015-10-14 DIAGNOSIS — R279 Unspecified lack of coordination: Secondary | ICD-10-CM | POA: Diagnosis not present

## 2015-10-14 DIAGNOSIS — M623 Immobility syndrome (paraplegic): Secondary | ICD-10-CM | POA: Diagnosis not present

## 2015-10-14 DIAGNOSIS — R531 Weakness: Secondary | ICD-10-CM | POA: Diagnosis not present

## 2015-10-14 DIAGNOSIS — R2991 Unspecified symptoms and signs involving the musculoskeletal system: Secondary | ICD-10-CM

## 2015-10-14 DIAGNOSIS — Z7409 Other reduced mobility: Secondary | ICD-10-CM

## 2015-10-14 DIAGNOSIS — R2689 Other abnormalities of gait and mobility: Secondary | ICD-10-CM

## 2015-10-14 NOTE — Therapy (Signed)
[facility] [address] [place], [place], [address] Phone: [phone]   Fax:  [phone]  Physical Therapy Treatment  Patient Details  Name: Wendy [name] MRN: [id number] Date of Birth: 1961-04-20 Referring Provider: Sarina Ill, MD  Encounter Date: 10/14/2015      PT End of Session - 10/14/15 1332    Visit Number 10   Number of Visits 18   Date for PT Re-Evaluation 11/08/15   Authorization Type Medicare G-Code & progress report every 10 visits   PT Start Time 1318   PT Stop Time 1400   PT Time Calculation (min) 42 min   Equipment Utilized During Treatment Gait belt   Activity Tolerance Patient limited by fatigue   Behavior During Therapy Anxious;WFL for tasks assessed/performed      Past Medical History  Diagnosis Date  . Hepatic cirrhosis due to chronic hepatitis C infection ([place])     ETOH causative as well  . Hiatal hernia   . Esophageal stricture     esophageal dysmotility and chronic dysphagia as well  . 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 History of alcohol abuse   . Anxiety and depression   . History of hip fracture   . Allergic rhinitis   . HTN (hypertension)   . Blood transfusion   . Cataract     MILD  . GERD (gastroesophageal reflux disease)   . Osteoporosis   . Ulcer     2007  . Arthritis   . Clotting disorder ([place])     prolonged clotting time due to liver disease  . Paraesophageal hernia   . Compression fracture 09/21/2013  . Hepatic encephalopathy syndrome [facility])     Past Surgical History  Procedure Laterality Date  . Orif hip fracture  2010    right x2  . Upper gastrointestinal endoscopy  08/05/2007    esophageal ring, hiatal hernia, portal gastropathy  . Splenectomy      age 55  . Burr hole for subdural hematoma  2004  . Upper gastrointestinal endoscopy  10/14/2011  . Colonoscopy  08/2012    moderatel left colon tics  . Kyphoplasty Bilateral  09/21/2013    Procedure: T12 - L1 KYPHOPLASTY;  Surgeon: Melina Schools, MD;  Location: [facility];  Service: Orthopedics;  Laterality: Bilateral;  . Esophagogastroduodenoscopy (egd) with propofol N/A 01/26/2014    Procedure: ESOPHAGOGASTRODUODENOSCOPY (EGD) WITH PROPOFOL;  Surgeon: Lafayette Dragon, MD;  Location: [facility] ENDOSCOPY;  Service: Endoscopy;  Laterality: N/A;    There were no vitals filed for this visit.  Visit Diagnosis:  Abnormality of gait  Weakness generalized  Unsteadiness  Difficulty walking  Impaired transfers  Balance problems      Subjective Assessment - 10/14/15 1324    Subjective Has had some "spells" since last session. Reports she "feels it coming on, then starts shaking all over and grabs what she can." Has gone to the floor 2x that she can recall since last visit. Once in the bathroom and her room mate got her up. Fell between wall and the bathtub.  Second one was in her bedroom due to her foot slipping on the carpet. Fell on buttocks. Room mate helped her up.  Denied any pain.                                Patient Stated Goals She wants to walk better & get  off floor   Currently in Pain? No/denies   Pain Score 0-No pain            [facility] Adult PT Treatment/Exercise - 10/14/15 0001    Transfers   Sit to Stand 5: Supervision;With upper extremity assist;From bed   Sit to Stand Details Verbal cues for safe use of DME/AE   Stand to Sit 5: Supervision;With upper extremity assist;To bed   Stand to Sit Details (indicate cue type and reason) Verbal cues for safe use of DME/AE   Ambulation/Gait   Ambulation/Gait Yes   Ambulation/Gait Assistance 5: Supervision   Ambulation/Gait Assistance Details cues on posture, step length and hand position on brakes with gait   Ambulation Distance (Feet) 230 Feet   Assistive device Rollator   Gait Pattern Step-through pattern;Decreased stride length;Trunk flexed;Wide base of support;Right flexed knee in stance;Left flexed knee in  stance   Ambulation Surface Level;Indoor   Timed Up and Go Test   Normal TUG (seconds) 27.4  with rollator     Exercise: Sit<>stands x 10 reps with no UE support Standing with rollator support: - heel/toe raises x 10 reps - alternating high knee marching x 10 each leg, cues for correct form/technique         PT Education - 10/14/15 1333    Education provided Yes   Education Details to start using shower bench again for safety in bathroom with bathing;safe use of rollator with gait (brake use)   Person(s) Educated Patient   Methods Explanation;Demonstration;Verbal cues   Comprehension Verbalized understanding;Verbal cues required;Need further instruction          PT Short Term Goals - 10/07/15 1334    PT SHORT TERM GOAL #1   Title Patient demonstrates understanding of initial HEP to address balance, strength & flexibility. (Target Date 10/11/2015)   Time 1   Period Months   Status New   PT SHORT TERM GOAL #2   Title Patient ambulates 200' with rolling walker with cues to improve safety with community mobility.  (Target Date 10/11/2015)   Baseline met on 10/07/15   Status Achieved   PT SHORT TERM GOAL #3   Title Berg Balance improves to >/= 28/56 to indicate lower fall risk.  (Target Date 10/11/2015)   Baseline 10/07/15: scored 44/56   Time --   Period --   Status Achieved   PT SHORT TERM GOAL #4   Title Patient performs floor transfers pushing on mat table with minA to improve ability to get up from floor safely.  (Target Date 10/11/2015)   Baseline met on 10/07/15   Status Achieved           PT Long Term Goals - 09/10/15 1100    PT LONG TERM GOAL #1   Title Verbalize understanding of fall prevention strategies in the home.  (Target Date 11/08/2015)   Time 2   Period Months   Status New   PT LONG TERM GOAL #2   Title Timed Up & Go without device <16sec to indicate lower fall risk.  (Target Date 11/08/2015)   Time 2   Period Months   Status New   PT LONG TERM  GOAL #3   Title Patient ambulates around furniture  42' modified indepdent for safe household mobility.  (Target Date 11/08/2015)   Time 2   Period Months   Status New   PT LONG TERM GOAL #4   Title Patient ambulates 500', negotiates ramps & curbs with rolling walker modified independent  for safe community access.  (Target Date 11/08/2015)   Time 2   Period Months   Status New   PT LONG TERM GOAL #5   Title Berg Balance test >/= 36/56 to indicate lower fall risk.  (Target Date 11/08/2015)   Time 2   Period Months   Status New   Additional Long Term Goals   Additional Long Term Goals Yes   PT LONG TERM GOAL #6   Title Patient verbalizes understanding of ongoing HEP / fitness plan.  (Target Date 11/08/2015)   Time 2   Period Months   Status New           Plan - October 16, 2015 1333    Clinical Impression Statement Skilled session focused on bathroom safety to prevent falls, use of shower bench and grab bars. Pt verbalized understanding. Pt continues to need cues on safe hand placement on rollator with gait and with transfers. Pt also continues to demo increased fatigue with exercises (tremors with fatigue, needing rest breaks). Pt is making steady progress towards goals,                                           Pt will benefit from skilled therapeutic intervention in order to improve on the following deficits Abnormal gait;Decreased activity tolerance;Decreased balance;Decreased endurance;Decreased knowledge of precautions;Decreased mobility;Decreased knowledge of use of DME;Decreased strength;Impaired flexibility;Postural dysfunction   Rehab Potential Good   PT Frequency 2x / week   PT Duration Other (comment)  9 weeks (60 days)   PT Treatment/Interventions ADLs/Self Care Home Management;DME Instruction;Gait training;Stair training;Functional mobility training;Therapeutic activities;Therapeutic exercise;Balance training;Neuromuscular re-education;Patient/family education;Vestibular   PT Next  Visit Plan assess remaining STGs. continue towards LTGs.   Consulted and Agree with Plan of Care Patient          G-Codes - 10/16/2015 1334    Functional Assessment Tool Used Berg Balance 44/56; timed up and go 27.40 sec's with rollator      Problem List Patient Active Problem List   Diagnosis Date Noted  . Chronic low back pain without sciatica 06/28/2015  . OSA (obstructive sleep apnea) 06/12/2015  . Excessive daytime sleepiness 04/11/2015  . Episodic cluster headache, not intractable 04/11/2015  . Obesity, morbid ([place]) 04/11/2015  . Snoring 04/11/2015  . Insomnia 12/27/2014  . Hypothyroidism 09/04/2014  . Cognitive and behavioral changes 07/17/2014  . Confusion 07/17/2014  . History of traumatic brain injury 07/10/2014  . Recurrent falls 06/29/2014  . Osteopenia 04/04/2014  . Esophageal varices in cirrhosis - trace 03/19/2014  . Anemia 02/01/2014  . Chronic kidney disease, unspecified ([place]) 02/01/2014  . Chronic back pain 02/01/2014  . Stricture and stenosis of esophagus 01/26/2014  . Fall at home 01/24/2014  . Near syncope 01/12/2014  . Loss of weight 01/12/2014  . Constipation 11/18/2013  . Compression fracture 09/21/2013  . Cirrhosis ([place]) 09/18/2013  . Atypical chest pain 07/26/2013  . Ascites 07/19/2013  . Burn 07/03/2013  . Dyspnea 02/03/2013  . Abnormal stress echo 01/19/2013  . Tobacco abuse 10/22/2012  . Allergic conjunctivitis 06/19/2012  . Skin lesion 06/19/2012  . Lumbar spondylosis 12/24/2011  . Obesity (BMI 35.0-39.9 without comorbidity) ([place]) 12/08/2011  . Right hip pain 12/01/2011  . Chronic hepatitis C - genotype 1A 10/06/2011  . GERD (gastroesophageal reflux disease) 10/06/2011  . Hepatic encephalopathy syndrome ([place]) 07/28/2011  . Back pain 04/07/2011  . PANIC DISORDER  WITH AGORAPHOBIA 04/17/2010  . THROMBOCYTOPENIA 10/21/2006  . Depression, major, recurrent, moderate ([place]) 10/21/2006  . HYPERTENSION, BENIGN SYSTEMIC 10/21/2006  . Hepatic  cirrhosis ([place]) 10/21/2006  . Generalized pruritus 10/21/2006    Willow Ora 10/14/2015, 3:55 PM  Willow Ora, PTA, [facility] [address] [place], [place] [address] [phone] 10/14/2015, 3:56 PM   Name: ALEANE WESENBERG MRN: [id number] Date of Birth: 1960/11/03

## 2015-10-15 ENCOUNTER — Encounter: Payer: Self-pay | Admitting: Physical Therapy

## 2015-10-15 NOTE — Therapy (Signed)
[facility] [address] [place], [place], [address] Phone: [phone]   Fax:  [phone]  Patient Details  Name: Wendy [name] MRN: [id number] Date of Birth: 09-08-1960 Referring Provider:  Sarina Ill, MD  Encounter Date: 10/15/2015   Physical Therapy Progress Note  Dates of Reporting Period: 09/10/2015 to 10/14/2015  Objective Reports of Subjective Statement: Patient is reporting less falls and most of those falls are when she has been ambulating without her rollator walker that PT has recommended.   Objective Measurements: Berg Balance improved to 44/56  Goal Update:      PT Short Term Goals - 10/07/15 1334    PT SHORT TERM GOAL #1   Title Patient demonstrates understanding of initial HEP to address balance, strength & flexibility. (Target Date 10/11/2015)   Time 1   Period Months   Status New   PT SHORT TERM GOAL #2   Title Patient ambulates 200' with rolling walker with cues to improve safety with community mobility.  (Target Date 10/11/2015)   Baseline met on 10/07/15   Status Achieved   PT SHORT TERM GOAL #3   Title Berg Balance improves to >/= 28/56 to indicate lower fall risk.  (Target Date 10/11/2015)   Baseline 10/07/15: scored 44/56   Time --   Period --   Status Achieved   PT SHORT TERM GOAL #4   Title Patient performs floor transfers pushing on mat table with minA to improve ability to get up from floor safely.  (Target Date 10/11/2015)   Baseline met on 10/07/15   Status Achieved         PT Long Term Goals - 09/10/15 1100    PT LONG TERM GOAL #1   Title Verbalize understanding of fall prevention strategies in the home.  (Target Date 11/08/2015)   Time 2   Period Months   Status New   PT LONG TERM GOAL #2   Title Timed Up & Go without device <16sec to indicate lower fall risk.  (Target Date 11/08/2015)   Time 2   Period Months   Status New   PT LONG TERM GOAL #3   Title Patient  ambulates around furniture  53' modified indepdent for safe household mobility.  (Target Date 11/08/2015)   Time 2   Period Months   Status New   PT LONG TERM GOAL #4   Title Patient ambulates 500', negotiates ramps & curbs with rolling walker modified independent for safe community access.  (Target Date 11/08/2015)   Time 2   Period Months   Status New   PT LONG TERM GOAL #5   Title Berg Balance test >/= 36/56 to indicate lower fall risk.  (Target Date 11/08/2015)   Time 2   Period Months   Status New   Additional Long Term Goals   Additional Long Term Goals Yes   PT LONG TERM GOAL #6   Title Patient verbalizes understanding of ongoing HEP / fitness plan.  (Target Date 11/08/2015)   Time 2   Period Months   Status New       Plan: Continue with plan of care established at initial evalutation  Reason Skilled Services are Required: Patient presents with balance & gait deficits resulting in multiple falls some with injury.    Jamey Reas PT, DPT 10/15/2015, 11:46 AM  [facility] [address] [place], [place], [address] Phone: [phone]   Fax:  [phone]

## 2015-10-16 ENCOUNTER — Encounter: Payer: Self-pay | Admitting: Physical Therapy

## 2015-10-16 ENCOUNTER — Ambulatory Visit: Payer: Medicare Other | Admitting: Physical Therapy

## 2015-10-16 DIAGNOSIS — R262 Difficulty in walking, not elsewhere classified: Secondary | ICD-10-CM | POA: Diagnosis not present

## 2015-10-16 DIAGNOSIS — R269 Unspecified abnormalities of gait and mobility: Secondary | ICD-10-CM | POA: Diagnosis not present

## 2015-10-16 DIAGNOSIS — R2681 Unsteadiness on feet: Secondary | ICD-10-CM

## 2015-10-16 DIAGNOSIS — M623 Immobility syndrome (paraplegic): Secondary | ICD-10-CM | POA: Diagnosis not present

## 2015-10-16 DIAGNOSIS — Z7409 Other reduced mobility: Secondary | ICD-10-CM

## 2015-10-16 DIAGNOSIS — R279 Unspecified lack of coordination: Secondary | ICD-10-CM | POA: Diagnosis not present

## 2015-10-16 DIAGNOSIS — R2689 Other abnormalities of gait and mobility: Secondary | ICD-10-CM

## 2015-10-16 DIAGNOSIS — R2991 Unspecified symptoms and signs involving the musculoskeletal system: Secondary | ICD-10-CM

## 2015-10-16 DIAGNOSIS — R531 Weakness: Secondary | ICD-10-CM | POA: Diagnosis not present

## 2015-10-16 DIAGNOSIS — M2560 Stiffness of unspecified joint, not elsewhere classified: Secondary | ICD-10-CM

## 2015-10-17 ENCOUNTER — Encounter: Payer: Self-pay | Admitting: Internal Medicine

## 2015-10-17 ENCOUNTER — Other Ambulatory Visit (INDEPENDENT_AMBULATORY_CARE_PROVIDER_SITE_OTHER): Payer: Medicare Other

## 2015-10-17 ENCOUNTER — Ambulatory Visit (INDEPENDENT_AMBULATORY_CARE_PROVIDER_SITE_OTHER): Payer: Medicare Other | Admitting: Internal Medicine

## 2015-10-17 VITALS — BP 142/96 | HR 68 | Ht 62.0 in | Wt 216.4 lb

## 2015-10-17 DIAGNOSIS — K7460 Unspecified cirrhosis of liver: Secondary | ICD-10-CM | POA: Diagnosis not present

## 2015-10-17 DIAGNOSIS — B182 Chronic viral hepatitis C: Secondary | ICD-10-CM | POA: Diagnosis not present

## 2015-10-17 DIAGNOSIS — K745 Biliary cirrhosis, unspecified: Secondary | ICD-10-CM

## 2015-10-17 DIAGNOSIS — K449 Diaphragmatic hernia without obstruction or gangrene: Secondary | ICD-10-CM

## 2015-10-17 HISTORY — DX: Diaphragmatic hernia without obstruction or gangrene: K44.9

## 2015-10-17 LAB — COMPREHENSIVE METABOLIC PANEL
ALT: 15 U/L (ref 0–35)
AST: 28 U/L (ref 0–37)
Albumin: 2.6 g/dL — ABNORMAL LOW (ref 3.5–5.2)
Alkaline Phosphatase: 124 U/L — ABNORMAL HIGH (ref 39–117)
BILIRUBIN TOTAL: 1.1 mg/dL (ref 0.2–1.2)
BUN: 16 mg/dL (ref 6–23)
CALCIUM: 8.7 mg/dL (ref 8.4–10.5)
CO2: 28 meq/L (ref 19–32)
CREATININE: 0.84 mg/dL (ref 0.40–1.20)
Chloride: 104 mEq/L (ref 96–112)
GFR: 74.6 mL/min (ref >60.00)
Glucose, Bld: 265 mg/dL — ABNORMAL HIGH (ref 70–99)
Potassium: 4.1 mEq/L (ref 3.5–5.1)
Sodium: 136 mEq/L (ref 135–145)
Total Protein: 7 g/dL (ref 6.0–8.3)

## 2015-10-17 LAB — CBC WITH DIFFERENTIAL/PLATELET
BASOS ABS: 0 10*3/uL (ref 0.0–0.1)
Basophils Relative: 0.5 % (ref 0.0–3.0)
EOS ABS: 0.1 10*3/uL (ref 0.0–0.7)
Eosinophils Relative: 1.9 % (ref 0.0–5.0)
HCT: 36.3 % (ref 36.0–46.0)
Hemoglobin: 12.1 g/dL (ref 12.0–15.0)
LYMPHS ABS: 1.8 10*3/uL (ref 0.7–4.0)
Lymphocytes Relative: 29.8 % (ref 12.0–46.0)
MCHC: 33.3 g/dL (ref 30.0–36.0)
MONO ABS: 1.1 10*3/uL — AB (ref 0.1–1.0)
NEUTROS ABS: 3 10*3/uL (ref 1.4–7.7)
NEUTROS PCT: 49.5 % (ref 43.0–77.0)
PLATELETS: 187 10*3/uL (ref 150.0–400.0)
RBC: 3.2 Mil/uL — AB (ref 3.87–5.11)
RDW: 15.9 % — ABNORMAL HIGH (ref 11.5–15.5)
WBC: 6 10*3/uL (ref 4.0–10.5)

## 2015-10-17 LAB — PROTIME-INR
INR: 1.5 ratio — AB (ref 0.8–1.0)
PROTHROMBIN TIME: 15.8 s — AB (ref 9.6–13.1)

## 2015-10-17 NOTE — Assessment & Plan Note (Addendum)
labs today, hepatitis C quantitative level to ensure durable remission.

## 2015-10-17 NOTE — Assessment & Plan Note (Signed)
Recheck labs today including CBC metabolic panel, hepatitis C level, INR. I want to see what child status she is, apparently she had trace varices on EGD in 2015 by my partner. Depending on her child'sstatus she could need a routine EGD now versus waiting until 2018. In the past she has been child's a I think.

## 2015-10-17 NOTE — Assessment & Plan Note (Deleted)
labs

## 2015-10-17 NOTE — Assessment & Plan Note (Signed)
This is known from previous barium study. Ideally it should be fixed though she's probably a higher risk surgery candidate. B see what her labs are, and I will consider referring her. This could also require an endoscopy sooner rather than later as she truly does have esophageal varices that could complicate things.

## 2015-10-17 NOTE — Progress Notes (Signed)
   Subjective:    Patient ID: Wendy [name], female    DOB: 1961/01/05, 55 y.o.   MRN: [id number] Chief complaint: Follow-up of cirrhosis HPI Patient is here for routine follow-up. She is complaining of some abdominal distention. No other specific complaints today. She think she is going to see the [facility] hepatology clinic next week. Medications, allergies, past medical history, past surgical history, family history and social history are reviewed and updated in the EMR.   Review of Systems As above, she is having mobility issues and loss of balance etc. and has gone to physical therapy.    Objective:   Physical Exam @BP  142/96 mmHg  Pulse 68  Ht 5\' 2"  (1.575 m)  Wt 216 lb 6.4 oz (98.158 kg)  BMI 39.57 kg/m2@  General:  NAD Eyes:   anicteric Lungs:  clear Heart:: S1S2 no rubs, murmurs or gallops Abdomen:  soft and nontender, BS+ no hepatosplenomegaly or mass stigmata of chronic liver disease seen Ext:   no edema, cyanosis or clubbing Neuro: No asterixis, she is alert and oriented 3    Data Reviewed:  Ultrasound in November, no liver lesions.    Assessment & Plan:  Chronic hepatitis C - genotype 1A labs today, hepatitis C quantitative level to ensure durable remission.  Hepatic cirrhosis Recheck labs today including CBC metabolic panel, hepatitis C level, INR. I want to see what child status she is, apparently she had trace varices on EGD in 2015 by my partner. Depending on her child'sstatus she could need a routine EGD now versus waiting until 2018. In the past she has been child's a I think.  Paraesophageal hiatal hernia This is known from previous barium study. Ideally it should be fixed though she's probably a higher risk surgery candidate. B see what her labs are, and I will consider referring her. This could also require an endoscopy sooner rather than later as she truly does have esophageal varices that could complicate things.  CC: [name], DO  I  will send a copy to Dr. Claybon Jabs

## 2015-10-17 NOTE — Patient Instructions (Signed)
  Your physician has requested that you go to the basement for lab work before leaving today.    I appreciate the opportunity to care for you. Carl Gessner, MD, FACG  

## 2015-10-17 NOTE — Therapy (Signed)
[facility] [address] [place], [place], [address] Phone: [phone]   Fax:  [phone]  Physical Therapy Treatment  Patient Details  Name: Wendy [name] MRN: [id number] Date of Birth: 1960/12/16 Referring Provider: Sarina Ill, MD  Encounter Date: 10/16/2015      PT End of Session - 10/16/15 1230    Visit Number 11   Number of Visits 18   Date for PT Re-Evaluation 11/08/15   Authorization Type Medicare G-Code & progress report every 10 visits   PT Start Time 1147   PT Stop Time 1230   PT Time Calculation (min) 43 min   Equipment Utilized During Treatment Gait belt   Activity Tolerance Patient limited by fatigue   Behavior During Therapy Anxious;WFL for tasks assessed/performed      Past Medical History  Diagnosis Date  . Hepatic cirrhosis due to chronic hepatitis C infection ([place])     ETOH causative as well  . Hiatal hernia   . Esophageal stricture     esophageal dysmotility and chronic dysphagia as well  . 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 History of alcohol abuse   . Anxiety and depression   . History of hip fracture   . Allergic rhinitis   . HTN (hypertension)   . Blood transfusion   . Cataract     MILD  . GERD (gastroesophageal reflux disease)   . Osteoporosis   . Ulcer     2007  . Arthritis   . Clotting disorder ([place])     prolonged clotting time due to liver disease  . Paraesophageal hernia   . Compression fracture 09/21/2013  . Hepatic encephalopathy syndrome [place])     Past Surgical History  Procedure Laterality Date  . Orif hip fracture  2010    right x2  . Upper gastrointestinal endoscopy  08/05/2007    esophageal ring, hiatal hernia, portal gastropathy  . 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 Burr hole for subdural hematoma  2004  . Upper gastrointestinal endoscopy  10/14/2011  . Colonoscopy  08/2012    moderatel left colon tics  . Kyphoplasty Bilateral  09/21/2013    Procedure: T12 - L1 KYPHOPLASTY;  Surgeon: Melina Schools, MD;  Location: [facility];  Service: Orthopedics;  Laterality: Bilateral;  . Esophagogastroduodenoscopy (egd) with propofol N/A 01/26/2014    Procedure: ESOPHAGOGASTRODUODENOSCOPY (EGD) WITH PROPOFOL;  Surgeon: Lafayette Dragon, MD;  Location: [facility] ENDOSCOPY;  Service: Endoscopy;  Laterality: N/A;    There were no vitals filed for this visit.  Visit Diagnosis:  Abnormality of gait  Weakness generalized  Unsteadiness  Impaired transfers  Balance problems  Stiffness due to immobility  Lack of coordination      Subjective Assessment - 10/16/15 1145    Subjective No falls since last PT session. She has been using her walker as PT advised.    Limitations Standing;Walking   Currently in Pain? No/denies                         [facility] Adult PT Treatment/Exercise - 10/16/15 1145    Transfers   Sit to Stand 5: Supervision;With upper extremity assist;From bed   Sit to Stand Details Verbal cues for safe use of DME/AE   Stand to Sit 5: Supervision;With upper extremity assist;To bed   Stand to Sit Details (indicate cue type and reason) Verbal cues for safe use of DME/AE  Ambulation/Gait   Ambulation/Gait Yes   Ambulation/Gait Assistance 5: Supervision   Ambulation/Gait Assistance Details verbal cues on position within RW for safety & posture.    Ambulation Distance (Feet) 200 Feet  200' X 2   Assistive device Rolling walker   Gait Pattern Step-through pattern;Decreased stride length;Trunk flexed;Wide base of support;Right flexed knee in stance;Left flexed knee in stance   Ambulation Surface Indoor;Level   Ramp 4: Min assist  min guard with RW   Ramp Details (indicate cue type and reason) verbal cues on technique including RW management   Curb 4: Min assist  Min guard with RW   Curb Details (indicate cue type and reason) verbal cues on technique   Self-Care   Self-Care ADL's   ADL's PT instructed in use  of walker accessories to aide carrying items like cup holder & baskets. Use of rollator seat in home to move laundry & other objects by placing them on seat.    Exercises   Exercises Knee/Hip   Knee/Hip Exercises: Aerobic   Nustep Level 1 with BUEs & BLEs for 6 minutues with PT cueing benefits and basics of set-ups.      Therapeutic Exercise: PT reviewed [place] HEP with need to perform all of exercises throughout her day to work whole body.            PT Education - 10/16/15 1230    Education provided Yes   Education Details use of walker accessories & rollator seat to aide ADLs, [employer] as option for ongoing fitness   Person(s) Educated Patient   Methods Explanation;Other (comment)  printed internet info on [place] Verbalized understanding          PT Short Term Goals - 10/16/15 1230    PT SHORT TERM GOAL #1   Title Patient demonstrates understanding of initial HEP to address balance, strength & flexibility. (Target Date 10/11/2015)   Baseline MET 10/16/2015 with verbalization.    Time 1   Period Months   Status Partially Met   PT SHORT TERM GOAL #2   Title Patient ambulates 200' with rolling walker with cues to improve safety with community mobility.  (Target Date 10/11/2015)   Baseline met on 10/07/15   Status Achieved   PT SHORT TERM GOAL #3   Title Berg Balance improves to >/= 28/56 to indicate lower fall risk.  (Target Date 10/11/2015)   Baseline 10/07/15: scored 44/56   Status Achieved   PT SHORT TERM GOAL #4   Title Patient performs floor transfers pushing on mat table with minA to improve ability to get up from floor safely.  (Target Date 10/11/2015)   Baseline met on 10/07/15   Status Achieved           PT Long Term Goals - 10/16/15 1230    PT LONG TERM GOAL #1   Title Verbalize understanding of fall prevention strategies in the home.  (Target Date 11/08/2015)   Time 2   Period Months   Status On-going   PT LONG TERM  GOAL #2   Title Timed Up & Go without device <16sec to indicate lower fall risk.  (Target Date 11/08/2015)   Time 2   Period Months   Status On-going   PT LONG TERM GOAL #3   Title Patient ambulates around furniture  28' modified indepdent for safe household mobility.  (Target Date 11/08/2015)   Time 2   Period Months   Status On-going  PT LONG TERM GOAL #4   Title Patient ambulates 500', negotiates ramps & curbs with rolling walker modified independent for safe community access.  (Target Date 11/08/2015)   Time 2   Period Months   Status On-going   PT LONG TERM GOAL #5   Title Berg Balance test >/= 36/56 to indicate lower fall risk.  (Target Date 11/08/2015)   Time 2   Period Months   Status On-going   PT LONG TERM GOAL #6   Title Patient verbalizes understanding of ongoing HEP / fitness plan.  (Target Date 11/08/2015)   Time 2   Period Months   Status On-going               Plan - 10/16/15 1230    Clinical Impression Statement Patient verbalizes understanding how rolling walker is helping her balance and use of accessories to help with ADLs & use RW to help balance. Patient verbalizes understanding of need to complete all of exercises a some point throughtout her day to manage whole body and recommendation to consider fitness program at local community center.    Pt will benefit from skilled therapeutic intervention in order to improve on the following deficits Abnormal gait;Decreased activity tolerance;Decreased balance;Decreased endurance;Decreased knowledge of precautions;Decreased mobility;Decreased knowledge of use of DME;Decreased strength;Impaired flexibility;Postural dysfunction   Rehab Potential Good   PT Frequency 2x / week   PT Duration Other (comment)  9 weeks (60 days)   PT Treatment/Interventions ADLs/Self Care Home Management;DME Instruction;Gait training;Stair training;Functional mobility training;Therapeutic activities;Therapeutic exercise;Balance  training;Neuromuscular re-education;Patient/family education;Vestibular   PT Next Visit Plan continue towards LTGs.   Consulted and Agree with Plan of Care Patient        Problem List Patient Active Problem List   Diagnosis Date Noted  . Chronic low back pain without sciatica 06/28/2015  . OSA (obstructive sleep apnea) 06/12/2015  . Excessive daytime sleepiness 04/11/2015  . Episodic cluster headache, not intractable 04/11/2015  . Obesity, morbid ([place]) 04/11/2015  . Snoring 04/11/2015  . Insomnia 12/27/2014  . Hypothyroidism 09/04/2014  . Cognitive and behavioral changes 07/17/2014  . Confusion 07/17/2014  . History of traumatic brain injury 07/10/2014  . Recurrent falls 06/29/2014  . Osteopenia 04/04/2014  . Esophageal varices in cirrhosis - trace 03/19/2014  . Anemia 02/01/2014  . Chronic kidney disease, unspecified ([place]) 02/01/2014  . Chronic back pain 02/01/2014  . Stricture and stenosis of esophagus 01/26/2014  . Fall at home 01/24/2014  . Near syncope 01/12/2014  . Loss of weight 01/12/2014  . Constipation 11/18/2013  . Compression fracture 09/21/2013  . Cirrhosis ([place]) 09/18/2013  . Atypical chest pain 07/26/2013  . Ascites 07/19/2013  . Burn 07/03/2013  . Dyspnea 02/03/2013  . Abnormal stress echo 01/19/2013  . Tobacco abuse 10/22/2012  . Allergic conjunctivitis 06/19/2012  . Skin lesion 06/19/2012  . Lumbar spondylosis 12/24/2011  . Obesity (BMI 35.0-39.9 without comorbidity) ([place]) 12/08/2011  . Right hip pain 12/01/2011  . Chronic hepatitis C - genotype 1A 10/06/2011  . GERD (gastroesophageal reflux disease) 10/06/2011  . Hepatic encephalopathy syndrome ([place]) 07/28/2011  . Back pain 04/07/2011  . 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 THROMBOCYTOPENIA 10/21/2006  . Depression, major, recurrent, moderate ([place]) 10/21/2006  . HYPERTENSION, BENIGN SYSTEMIC 10/21/2006  . Hepatic cirrhosis ([place]) 10/21/2006  . Generalized pruritus 10/21/2006     Jamey Reas PT, DPT 10/17/2015, 11:35 AM  [facility] [address] [place], [place], [address] Phone: [phone]   Fax:  Kevin  Name: Wendy [name] MRN: [id number] Date of Birth: Jun 12, 1961

## 2015-10-18 ENCOUNTER — Telehealth: Payer: Self-pay | Admitting: Internal Medicine

## 2015-10-18 LAB — AFP TUMOR MARKER: AFP-Tumor Marker: 8.1 ng/mL — ABNORMAL HIGH (ref <6.1)

## 2015-10-18 NOTE — Telephone Encounter (Signed)
All questions about paraesophageal hernia answered.  She is aware I or Dr. Carlean Purl will call her back with the results of labs when they are available.

## 2015-10-21 ENCOUNTER — Ambulatory Visit: Payer: Medicare Other | Admitting: Physical Therapy

## 2015-10-21 DIAGNOSIS — K7460 Unspecified cirrhosis of liver: Secondary | ICD-10-CM | POA: Diagnosis not present

## 2015-10-21 LAB — HEPATITIS C RNA QUANTITATIVE: HCV Quantitative: NOT DETECTED IU/mL (ref <15)

## 2015-10-21 NOTE — Telephone Encounter (Signed)
Received medication request from [facility] for Lexapro 10mg . Per Dr. De Nurse, medication request is denied. Pt received medication refill on 09/26/15 with 1 refill. Pt is schedule for a f/u appt on 10/22/15.

## 2015-10-22 ENCOUNTER — Ambulatory Visit: Payer: Self-pay | Admitting: Family Medicine

## 2015-10-22 ENCOUNTER — Other Ambulatory Visit: Payer: Self-pay | Admitting: Family Medicine

## 2015-10-22 DIAGNOSIS — E039 Hypothyroidism, unspecified: Secondary | ICD-10-CM

## 2015-10-22 DIAGNOSIS — L299 Pruritus, unspecified: Secondary | ICD-10-CM

## 2015-10-22 NOTE — Progress Notes (Signed)
Quick Note:  Labs OK except glucose 265 HCV remains gone Other mild abnormalities not an issue  Was not fasting but 265 is still high - f/u PCP  Please cc Dr. Monica Martinez ______

## 2015-10-23 ENCOUNTER — Encounter: Payer: Self-pay | Admitting: Physical Therapy

## 2015-10-23 ENCOUNTER — Ambulatory Visit: Payer: Medicare Other | Attending: Neurology | Admitting: Physical Therapy

## 2015-10-23 DIAGNOSIS — M623 Immobility syndrome (paraplegic): Secondary | ICD-10-CM | POA: Diagnosis not present

## 2015-10-23 DIAGNOSIS — R2681 Unsteadiness on feet: Secondary | ICD-10-CM | POA: Diagnosis not present

## 2015-10-23 DIAGNOSIS — R279 Unspecified lack of coordination: Secondary | ICD-10-CM | POA: Diagnosis not present

## 2015-10-23 DIAGNOSIS — R531 Weakness: Secondary | ICD-10-CM | POA: Insufficient documentation

## 2015-10-23 DIAGNOSIS — R2991 Unspecified symptoms and signs involving the musculoskeletal system: Secondary | ICD-10-CM | POA: Insufficient documentation

## 2015-10-23 DIAGNOSIS — R262 Difficulty in walking, not elsewhere classified: Secondary | ICD-10-CM | POA: Diagnosis not present

## 2015-10-23 DIAGNOSIS — M47817 Spondylosis without myelopathy or radiculopathy, lumbosacral region: Secondary | ICD-10-CM | POA: Insufficient documentation

## 2015-10-23 DIAGNOSIS — M6281 Muscle weakness (generalized): Secondary | ICD-10-CM | POA: Insufficient documentation

## 2015-10-23 DIAGNOSIS — M545 Low back pain: Secondary | ICD-10-CM | POA: Insufficient documentation

## 2015-10-23 DIAGNOSIS — R269 Unspecified abnormalities of gait and mobility: Secondary | ICD-10-CM | POA: Insufficient documentation

## 2015-10-23 DIAGNOSIS — M2560 Stiffness of unspecified joint, not elsewhere classified: Secondary | ICD-10-CM

## 2015-10-23 DIAGNOSIS — R29818 Other symptoms and signs involving the nervous system: Secondary | ICD-10-CM | POA: Insufficient documentation

## 2015-10-23 DIAGNOSIS — Z7409 Other reduced mobility: Secondary | ICD-10-CM

## 2015-10-23 DIAGNOSIS — R2689 Other abnormalities of gait and mobility: Secondary | ICD-10-CM

## 2015-10-23 NOTE — Therapy (Signed)
[facility] [address] [place], [place], [address] Phone: [phone]   Fax:  [phone]  Physical Therapy Treatment  Patient Details  Name: Wendy [name] MRN: [id number] Date of Birth: [birth year] Referring Provider: Sarina Ill, MD  Encounter Date: 10/23/2015      PT End of Session - 10/23/15 1230    Visit Number 12   Number of Visits 18   Date for PT Re-Evaluation 11/08/15   Authorization Type Medicare G-Code & progress report every 10 visits   PT Start Time 1147   PT Stop Time 1230   PT Time Calculation (min) 43 min   Equipment Utilized During Treatment Gait belt   Activity Tolerance Patient limited by fatigue   Behavior During Therapy Anxious;WFL for tasks assessed/performed      Past Medical History  Diagnosis Date  . Hepatic cirrhosis due to chronic hepatitis C infection ([place])     ETOH causative as well  . Hiatal hernia   . Esophageal stricture     esophageal dysmotility and chronic dysphagia as well  . 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 History of alcohol abuse   . Anxiety and depression   . History of hip fracture   . Allergic rhinitis   . HTN (hypertension)   . Blood transfusion   . Cataract     MILD  . GERD (gastroesophageal reflux disease)   . Osteoporosis   . Ulcer     2007  . Arthritis   . Clotting disorder ([place])     prolonged clotting time due to liver disease  . Paraesophageal hernia   . Compression fracture 09/21/2013  . Hepatic encephalopathy syndrome ([place])   . Paraesophageal hiatal hernia 10/17/2015    Past Surgical History  Procedure Laterality Date  . Orif hip fracture  2010    right x2  . Upper gastrointestinal endoscopy  08/05/2007    esophageal ring, hiatal hernia, portal gastropathy  . Splenectomy      age 55  . Burr hole for subdural hematoma  2004  . Upper gastrointestinal endoscopy  10/14/2011  . Colonoscopy  08/2012    moderatel  left colon tics  . Kyphoplasty Bilateral 09/21/2013    Procedure: T12 - L1 KYPHOPLASTY;  Surgeon: Melina Schools, MD;  Location: [facility];  Service: Orthopedics;  Laterality: Bilateral;  . Esophagogastroduodenoscopy (egd) with propofol N/A 01/26/2014    Procedure: ESOPHAGOGASTRODUODENOSCOPY (EGD) WITH PROPOFOL;  Surgeon: Lafayette Dragon, MD;  Location: [facility] ENDOSCOPY;  Service: Endoscopy;  Laterality: N/A;    There were no vitals filed for this visit.  Visit Diagnosis:  Abnormality of gait  Weakness generalized  Unsteadiness  Impaired transfers  Balance problems  Stiffness due to immobility  Lack of coordination      Subjective Assessment - 10/23/15 1152    Subjective She saw 2 doctors since last PT session. Her blood work had elevated blood glucose.    Currently in Pain? No/denies                         [facility] Adult PT Treatment/Exercise - 10/23/15 1145    Transfers   Sit to Stand 5: Supervision;With upper extremity assist;From bed   Sit to Stand Details Verbal cues for safe use of DME/AE   Stand to Sit 5: Supervision;With upper extremity assist;To bed   Stand to Sit Details (indicate cue type and reason) Verbal cues for safe use of  DME/AE   Ambulation/Gait   Ambulation/Gait Yes   Ambulation/Gait Assistance 5: Supervision   Ambulation/Gait Assistance Details cues on rollator control, step length, posture   Ambulation Distance (Feet) 200 Feet  200' X 3   Assistive device Rollator   Gait Pattern Step-through pattern;Decreased stride length;Trunk flexed;Wide base of support;Right flexed knee in stance;Left flexed knee in stance   Ambulation Surface Indoor;Level   Ramp 5: Supervision  rollator walker   Ramp Details (indicate cue type and reason) cues on technique including rollator control   Curb 5: Supervision  rollator walker   Curb Details (indicate cue type and reason) cues on technique including rollator control   High Level Balance   High Level Balance  Activities Side stepping;Braiding;Backward walking;Turns;Direction changes   High Level Balance Comments tactile, verbal & visual cues   Self-Care   Self-Care --   ADL's --   Exercises   Exercises --   Knee/Hip Exercises: Aerobic   Nustep Level 2 with BUEs & BLEs for 6 minutues with PT cueing benefits and basics of set-ups.                 PT Education - 10/23/15 1145    Education provided Yes   Education Details activity level, exercise, diet / weight and diabetes. fasting vs non-fasting glucose   Person(s) Educated Patient   Methods Explanation   Comprehension Verbalized understanding          PT Short Term Goals - 10/16/15 1230    PT SHORT TERM GOAL #1   Title Patient demonstrates understanding of initial HEP to address balance, strength & flexibility. (Target Date 10/11/2015)   Baseline MET 10/16/2015 with verbalization.    Time 1   Period Months   Status Partially Met   PT SHORT TERM GOAL #2   Title Patient ambulates 200' with rolling walker with cues to improve safety with community mobility.  (Target Date 10/11/2015)   Baseline met on 10/07/15   Status Achieved   PT SHORT TERM GOAL #3   Title Berg Balance improves to >/= 28/56 to indicate lower fall risk.  (Target Date 10/11/2015)   Baseline 10/07/15: scored 44/56   Status Achieved   PT SHORT TERM GOAL #4   Title Patient performs floor transfers pushing on mat table with minA to improve ability to get up from floor safely.  (Target Date 10/11/2015)   Baseline met on 10/07/15   Status Achieved           PT Long Term Goals - 10/16/15 1230    PT LONG TERM GOAL #1   Title Verbalize understanding of fall prevention strategies in the home.  (Target Date 11/08/2015)   Time 2   Period Months   Status On-going   PT LONG TERM GOAL #2   Title Timed Up & Go without device <16sec to indicate lower fall risk.  (Target Date 11/08/2015)   Time 2   Period Months   Status On-going   PT LONG TERM GOAL #3   Title Patient  ambulates around furniture  59' modified indepdent for safe household mobility.  (Target Date 11/08/2015)   Time 2   Period Months   Status On-going   PT LONG TERM GOAL #4   Title Patient ambulates 500', negotiates ramps & curbs with rolling walker modified independent for safe community access.  (Target Date 11/08/2015)   Time 2   Period Months   Status On-going   PT LONG TERM GOAL #5  Title [employer] test >/= 36/56 to indicate lower fall risk.  (Target Date 11/08/2015)   Time 2   Period Months   Status On-going   PT LONG TERM GOAL #6   Title Patient verbalizes understanding of ongoing HEP / fitness plan.  (Target Date 11/08/2015)   Time 2   Period Months   Status On-going               Plan - 10/23/15 1230    Clinical Impression Statement Patient was concerned with diabetes with high glucose with recent non-fasting labs. Patient reports better understanding including need to increase activity /exercise level. Patient tolerates more actvities without muscle tremors.    Pt will benefit from skilled therapeutic intervention in order to improve on the following deficits Abnormal gait;Decreased activity tolerance;Decreased balance;Decreased endurance;Decreased knowledge of precautions;Decreased mobility;Decreased knowledge of use of DME;Decreased strength;Impaired flexibility;Postural dysfunction   Rehab Potential Good   PT Frequency 2x / week   PT Duration Other (comment)  9 weeks (60 days)   PT Treatment/Interventions ADLs/Self Care Home Management;DME Instruction;Gait training;Stair training;Functional mobility training;Therapeutic activities;Therapeutic exercise;Balance training;Neuromuscular re-education;Patient/family education;Vestibular   PT Next Visit Plan continue towards LTGs.   Consulted and Agree with Plan of Care Patient        Problem List Patient Active Problem List   Diagnosis Date Noted  . Paraesophageal hiatal hernia 10/17/2015  . Chronic low back pain  without sciatica 06/28/2015  . OSA (obstructive sleep apnea) 06/12/2015  . Excessive daytime sleepiness 04/11/2015  . Episodic cluster headache, not intractable 04/11/2015  . Obesity, morbid ([place]) 04/11/2015  . Snoring 04/11/2015  . Insomnia 12/27/2014  . Hypothyroidism 09/04/2014  . Cognitive and behavioral changes 07/17/2014  . Confusion 07/17/2014  . History of traumatic brain injury 07/10/2014  . Recurrent falls 06/29/2014  . Osteopenia 04/04/2014  . Esophageal varices in cirrhosis - trace 03/19/2014  . Anemia 02/01/2014  . Chronic kidney disease, unspecified ([place]) 02/01/2014  . Chronic back pain 02/01/2014  . Stricture and stenosis of esophagus 01/26/2014  . Fall at home 01/24/2014  . Near syncope 01/12/2014  . Loss of weight 01/12/2014  . Constipation 11/18/2013  . Compression fracture 09/21/2013  . Cirrhosis ([place]) 09/18/2013  . Atypical chest pain 07/26/2013  . Ascites 07/19/2013  . Burn 07/03/2013  . Dyspnea 02/03/2013  . Abnormal stress echo 01/19/2013  . Tobacco abuse 10/22/2012  . Allergic conjunctivitis 06/19/2012  . Skin lesion 06/19/2012  . Lumbar spondylosis 12/24/2011  . Obesity (BMI 35.0-39.9 without comorbidity) ([place]) 12/08/2011  . Right hip pain 12/01/2011  . Chronic hepatitis C - genotype 1A 10/06/2011  . GERD (gastroesophageal reflux disease) 10/06/2011  . Hepatic encephalopathy syndrome ([place]) 07/28/2011  . Back pain 04/07/2011  . 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 THROMBOCYTOPENIA 10/21/2006  . Depression, major, recurrent, moderate ([place]) 10/21/2006  . HYPERTENSION, BENIGN SYSTEMIC 10/21/2006  . Hepatic cirrhosis ([place]) 10/21/2006  . Generalized pruritus 10/21/2006    Jamey Reas PT, DPT 10/23/2015, 8:20 PM  [facility] [address] [place], [place], [address] Phone: [phone]   Fax:  [phone]  Name: MARYEM SHUFFLER MRN: [id number] Date of Birth:  1960/10/07

## 2015-10-24 ENCOUNTER — Telehealth: Payer: Self-pay | Admitting: Family Medicine

## 2015-10-24 ENCOUNTER — Encounter: Payer: Self-pay | Admitting: Physical Therapy

## 2015-10-24 ENCOUNTER — Ambulatory Visit: Payer: Medicare Other | Admitting: Physical Therapy

## 2015-10-24 DIAGNOSIS — R2681 Unsteadiness on feet: Secondary | ICD-10-CM

## 2015-10-24 DIAGNOSIS — R531 Weakness: Secondary | ICD-10-CM | POA: Diagnosis not present

## 2015-10-24 DIAGNOSIS — M623 Immobility syndrome (paraplegic): Secondary | ICD-10-CM | POA: Diagnosis not present

## 2015-10-24 DIAGNOSIS — R269 Unspecified abnormalities of gait and mobility: Secondary | ICD-10-CM

## 2015-10-24 DIAGNOSIS — R2991 Unspecified symptoms and signs involving the musculoskeletal system: Secondary | ICD-10-CM | POA: Diagnosis not present

## 2015-10-24 DIAGNOSIS — R29818 Other symptoms and signs involving the nervous system: Secondary | ICD-10-CM | POA: Diagnosis not present

## 2015-10-24 DIAGNOSIS — R2689 Other abnormalities of gait and mobility: Secondary | ICD-10-CM

## 2015-10-24 NOTE — Telephone Encounter (Signed)
No, she does not need to fast for her upcoming appointment, next Friday 3/10. For Diabetes screening we will check an A1c finger stick, get results same day, not the blood chemistry.  [name], [place], PGY-3

## 2015-10-24 NOTE — Telephone Encounter (Signed)
Wendy [name] found out that her blood sugar was too high.  Had a reading of 260.  Want to know what she need to do.

## 2015-10-24 NOTE — Telephone Encounter (Signed)
Called patient back. She states that 10/17/15 labs she was not fasting, had glucose 265. Chart review showed no prior glucose >200 in past. No recent A1c documented and no personal history of diabetes.  Patient is scheduled for follow-up OV check-up with me on 11/01/15, advised her that we will check A1c DM screening at that time. Answered all her questions. Advised that there is a good chance she may be Diabetic, given the random glucose >200, however we will repeat blood testing, with A1c. Gave her return precautions when to go to ED and symptoms of hyperglycemia.  [name], [place], PGY-3
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
## 2015-10-24 NOTE — Telephone Encounter (Signed)
Want to have provider call her to see if she need to fast for her upcoming appt.

## 2015-10-25 ENCOUNTER — Ambulatory Visit (INDEPENDENT_AMBULATORY_CARE_PROVIDER_SITE_OTHER): Payer: Medicare Other | Admitting: Neurology

## 2015-10-25 ENCOUNTER — Encounter: Payer: Self-pay | Admitting: Neurology

## 2015-10-25 VITALS — BP 118/64 | HR 58 | Ht 62.0 in | Wt 214.0 lb

## 2015-10-25 DIAGNOSIS — S069X5D Unspecified intracranial injury with loss of consciousness greater than 24 hours with return to pre-existing conscious level, subsequent encounter: Secondary | ICD-10-CM | POA: Diagnosis not present

## 2015-10-25 DIAGNOSIS — S069X9A Unspecified intracranial injury with loss of consciousness of unspecified duration, initial encounter: Secondary | ICD-10-CM | POA: Insufficient documentation

## 2015-10-25 DIAGNOSIS — S069XAA Unspecified intracranial injury with loss of consciousness status unknown, initial encounter: Secondary | ICD-10-CM | POA: Insufficient documentation

## 2015-10-25 DIAGNOSIS — R269 Unspecified abnormalities of gait and mobility: Secondary | ICD-10-CM | POA: Diagnosis not present

## 2015-10-25 DIAGNOSIS — G4733 Obstructive sleep apnea (adult) (pediatric): Secondary | ICD-10-CM

## 2015-10-25 DIAGNOSIS — R6889 Other general symptoms and signs: Secondary | ICD-10-CM | POA: Diagnosis not present

## 2015-10-25 DIAGNOSIS — Z9989 Dependence on other enabling machines and devices: Secondary | ICD-10-CM
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
## 2015-10-25 NOTE — Patient Instructions (Addendum)
1.  Continue physical therapy. 2.  Must use walker at all times, even at home 3.  Will get a home health assessment for home safety 4.  Will refer you to another sleep specialist 5.  Follow up in 6 months.

## 2015-10-25 NOTE — Telephone Encounter (Signed)
Patient informed, expressed understanding. 

## 2015-10-28 ENCOUNTER — Ambulatory Visit: Payer: Medicare Other | Admitting: Physical Therapy

## 2015-10-28 ENCOUNTER — Telehealth: Payer: Self-pay | Admitting: Neurology

## 2015-10-28 NOTE — Telephone Encounter (Signed)
Pt states that she saw Dr Tomi Likens on Friday and has some more questions please call [phone]

## 2015-10-28 NOTE — Telephone Encounter (Signed)
Patient was called to find out what Dr. Tomi Likens said that she is diagnosed with.  Informed her that it is traumatic brain injury and cognitive impairment.  She was grateful to have met Dr. Tomi Likens and will be back in 6 months for a follow up.

## 2015-10-30 ENCOUNTER — Encounter: Payer: Self-pay | Admitting: Physical Therapy

## 2015-10-30 ENCOUNTER — Ambulatory Visit: Payer: Medicare Other | Admitting: Physical Therapy

## 2015-10-30 DIAGNOSIS — R2681 Unsteadiness on feet: Secondary | ICD-10-CM | POA: Diagnosis not present

## 2015-10-30 DIAGNOSIS — M623 Immobility syndrome (paraplegic): Secondary | ICD-10-CM | POA: Diagnosis not present

## 2015-10-30 DIAGNOSIS — R29818 Other symptoms and signs involving the nervous system: Secondary | ICD-10-CM | POA: Diagnosis not present

## 2015-10-30 DIAGNOSIS — R2991 Unspecified symptoms and signs involving the musculoskeletal system: Secondary | ICD-10-CM | POA: Diagnosis not present

## 2015-10-30 DIAGNOSIS — R531 Weakness: Secondary | ICD-10-CM

## 2015-10-30 DIAGNOSIS — M2560 Stiffness of unspecified joint, not elsewhere classified: Secondary | ICD-10-CM

## 2015-10-30 DIAGNOSIS — R269 Unspecified abnormalities of gait and mobility: Secondary | ICD-10-CM | POA: Diagnosis not present

## 2015-10-30 DIAGNOSIS — R279 Unspecified lack of coordination: Secondary | ICD-10-CM

## 2015-10-30 DIAGNOSIS — R2689 Other abnormalities of gait and mobility: Secondary | ICD-10-CM

## 2015-10-30 DIAGNOSIS — Z7409 Other reduced mobility: Secondary | ICD-10-CM

## 2015-10-31 NOTE — Therapy (Signed)
[facility] [address] [place], [place], [address] Phone: [phone]   Fax:  [phone]  Physical Therapy Treatment  Patient Details  Name: Wendy [name] MRN: [id number] Date of Birth: [birth year] Referring Provider: Sarina Ill, MD  Encounter Date: 10/30/2015      PT End of Session - 10/30/15 1230    Visit Number 14   Number of Visits 18   Date for PT Re-Evaluation 11/08/15   Authorization Type Medicare G-Code & progress report every 10 visits   PT Start Time 1150   PT Stop Time 1230   PT Time Calculation (min) 40 min   Equipment Utilized During Treatment Gait belt   Activity Tolerance Patient limited by fatigue   Behavior During Therapy Anxious;WFL for tasks assessed/performed      Past Medical History  Diagnosis Date  . Hepatic cirrhosis due to chronic hepatitis C infection ([place])     ETOH causative as well  . Hiatal hernia   . Esophageal stricture     esophageal dysmotility and chronic dysphagia as well  . 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 History of alcohol abuse   . Anxiety and depression   . History of hip fracture   . Allergic rhinitis   . HTN (hypertension)   . Blood transfusion   . Cataract     MILD  . GERD (gastroesophageal reflux disease)   . Osteoporosis   . Ulcer     2007  . Arthritis   . Clotting disorder ([place])     prolonged clotting time due to liver disease  . Paraesophageal hernia   . Compression fracture 09/21/2013  . Hepatic encephalopathy syndrome ([place])   . Paraesophageal hiatal hernia 10/17/2015    Past Surgical History  Procedure Laterality Date  . Orif hip fracture  2010    right x2  . Upper gastrointestinal endoscopy  08/05/2007    esophageal ring, hiatal hernia, portal gastropathy  . Splenectomy      age 13  . Burr hole for subdural hematoma  2004  . Upper gastrointestinal endoscopy  10/14/2011  . Colonoscopy  08/2012    moderatel  left colon tics  . Kyphoplasty Bilateral 09/21/2013    Procedure: T12 - L1 KYPHOPLASTY;  Surgeon: Melina Schools, MD;  Location: [facility];  Service: Orthopedics;  Laterality: Bilateral;  . Esophagogastroduodenoscopy (egd) with propofol N/A 01/26/2014    Procedure: ESOPHAGOGASTRODUODENOSCOPY (EGD) WITH PROPOFOL;  Surgeon: Lafayette Dragon, MD;  Location[facility] ENDOSCOPY;  Service: Endoscopy;  Laterality: N/A;    There were no vitals filed for this visit.  Visit Diagnosis:  Abnormality of gait  Unsteadiness  Balance problems  Weakness generalized  Impaired transfers  Stiffness due to immobility  Lack of coordination      Subjective Assessment - 10/30/15 1145    Subjective Patient reports no falls or issues. She requested to discuss fitness more including food issues.    Limitations Standing;Walking   Patient Stated Goals She wants to walk better & get off floor   Currently in Pain? No/denies                         [facility] Adult PT Treatment/Exercise - 10/30/15 1145    Ambulation/Gait   Ambulation/Gait Yes   Ambulation/Gait Assistance 5: Supervision   Ambulation/Gait Assistance Details cues on hand position for brake management & position in rollator for safety   Ambulation Distance (Feet) 230  Feet  230 outside, 150' X 2 inside   Assistive device Rollator   Gait Pattern Step-through pattern;Decreased stride length;Trunk flexed;Wide base of support;Right flexed knee in stance;Left flexed knee in stance   Ambulation Surface Indoor;Level;Outdoor;Paved   Ramp 4: Min assist  rollator outside   Ramp Details (indicate cue type and reason) cues on rollator /brake mananagement   Curb 4: Min assist  rollator outside   [employer] Details (indicate cue type and reason) tactile & verbal cues on technique including rollator management   Neuro Re-ed    Neuro Re-ed Details  --                PT Education - 10/30/15 1230    Education provided Yes   Education Details  incontinence issues with fall risk if rushing, water consumption/ dehydration, blood glucose with meal size & timing with can also impact fall risk, activity level / conditioning with fall risk   Person(s) Educated Patient   Methods Explanation   Comprehension Verbalized understanding;Need further instruction          PT Short Term Goals - 10/16/15 1230    PT SHORT TERM GOAL #1   Title Patient demonstrates understanding of initial HEP to address balance, strength & flexibility. (Target Date 10/11/2015)   Baseline MET 10/16/2015 with verbalization.    Time 1   Period Months   Status Partially Met   PT SHORT TERM GOAL #2   Title Patient ambulates 200' with rolling walker with cues to improve safety with community mobility.  (Target Date 10/11/2015)   Baseline met on 10/07/15   Status Achieved   PT SHORT TERM GOAL #3   Title Berg Balance improves to >/= 28/56 to indicate lower fall risk.  (Target Date 10/11/2015)   Baseline 10/07/15: scored 44/56   Status Achieved   PT SHORT TERM GOAL #4   Title Patient performs floor transfers pushing on mat table with minA to improve ability to get up from floor safely.  (Target Date 10/11/2015)   Baseline met on 10/07/15   Status Achieved           PT Long Term Goals - 10/16/15 1230    PT LONG TERM GOAL #1   Title Verbalize understanding of fall prevention strategies in the home.  (Target Date 11/08/2015)   Time 2   Period Months   Status On-going   PT LONG TERM GOAL #2   Title Timed Up & Go without device <16sec to indicate lower fall risk.  (Target Date 11/08/2015)   Time 2   Period Months   Status On-going   PT LONG TERM GOAL #3   Title Patient ambulates around furniture  38' modified indepdent for safe household mobility.  (Target Date 11/08/2015)   Time 2   Period Months   Status On-going   PT LONG TERM GOAL #4   Title Patient ambulates 500', negotiates ramps & curbs with rolling walker modified independent for safe community access.   (Target Date 11/08/2015)   Time 2   Period Months   Status On-going   PT LONG TERM GOAL #5   Title Berg Balance test >/= 36/56 to indicate lower fall risk.  (Target Date 11/08/2015)   Time 2   Period Months   Status On-going   PT LONG TERM GOAL #6   Title Patient verbalizes understanding of ongoing HEP / fitness plan.  (Target Date 11/08/2015)   Time 2   Period Months   Status On-going  Plan - 10/30/15 1230    Clinical Impression Statement Patient required increased cues for instruction to ambulate outside with rollator probably due to environment change with deficits from her brain injury. Patient reports using rollator walker more including using it move tray of food in home as PT previously instructed. This indicates some carryover with repetition to instructions.   Pt will benefit from skilled therapeutic intervention in order to improve on the following deficits Abnormal gait;Decreased activity tolerance;Decreased balance;Decreased endurance;Decreased knowledge of precautions;Decreased mobility;Decreased knowledge of use of DME;Decreased strength;Impaired flexibility;Postural dysfunction   Rehab Potential Good   PT Frequency 2x / week   PT Duration Other (comment)  9 weeks (60 days)   PT Treatment/Interventions ADLs/Self Care Home Management;DME Instruction;Gait training;Stair training;Functional mobility training;Therapeutic activities;Therapeutic exercise;Balance training;Neuromuscular re-education;Patient/family education;Vestibular   PT Next Visit Plan Address getting off floor which is one of patient's goals. Continue toward LTGs. Address barriers again with rollator, possibly outdoors.   Consulted and Agree with Plan of Care Patient        Problem List Patient Active Problem List   Diagnosis Date Noted  . TBI (traumatic brain injury) ([place]) 10/25/2015  . OSA on CPAP 10/25/2015  . Paraesophageal hiatal hernia 10/17/2015  . Chronic low back pain without  sciatica 06/28/2015  . OSA (obstructive sleep apnea) 06/12/2015  . Excessive daytime sleepiness 04/11/2015  . Episodic cluster headache, not intractable 04/11/2015  . Obesity, morbid ([place]) 04/11/2015  . Snoring 04/11/2015  . Insomnia 12/27/2014  . Hypothyroidism 09/04/2014  . Cognitive and behavioral changes 07/17/2014  . Confusion 07/17/2014  . History of traumatic brain injury 07/10/2014  . Recurrent falls 06/29/2014  . Osteopenia 04/04/2014  . Esophageal varices in cirrhosis - trace 03/19/2014  . Anemia 02/01/2014  . Chronic kidney disease, unspecified ([place]) 02/01/2014  . Chronic back pain 02/01/2014  . Stricture and stenosis of esophagus 01/26/2014  . Fall at home 01/24/2014  . Near syncope 01/12/2014  . Loss of weight 01/12/2014  . Constipation 11/18/2013  . Compression fracture 09/21/2013  . Cirrhosis ([place]) 09/18/2013  . Atypical chest pain 07/26/2013  . Ascites 07/19/2013  . Burn 07/03/2013  . Dyspnea 02/03/2013  . Abnormal stress echo 01/19/2013  . Tobacco abuse 10/22/2012  . Allergic conjunctivitis 06/19/2012  . Skin lesion 06/19/2012  . Lumbar spondylosis 12/24/2011  . Obesity (BMI 35.0-39.9 without comorbidity) ([place]) 12/08/2011  . Right hip pain 12/01/2011  . Chronic hepatitis C - genotype 1A 10/06/2011  . GERD (gastroesophageal reflux disease) 10/06/2011  . Hepatic encephalopathy syndrome ([place]) 07/28/2011  . Back pain 04/07/2011  . 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 THROMBOCYTOPENIA 10/21/2006  . Depression, major, recurrent, moderate ([place]) 10/21/2006  . HYPERTENSION, BENIGN SYSTEMIC 10/21/2006  . Hepatic cirrhosis ([place]) 10/21/2006  . Generalized pruritus 10/21/2006    Jamey Reas PT, DPT 10/31/2015, 7:58 AM  [facility] [address] [place], [place], [address] Phone: [phone]   Fax:  [phone]  Name: Wendy [name] MRN: [id number] Date of Birth: [birth year]

## 2015-11-01 ENCOUNTER — Ambulatory Visit (INDEPENDENT_AMBULATORY_CARE_PROVIDER_SITE_OTHER): Payer: Medicare Other | Admitting: Family Medicine

## 2015-11-01 ENCOUNTER — Encounter: Payer: Self-pay | Admitting: Family Medicine

## 2015-11-01 VITALS — BP 134/79 | HR 72 | Temp 98.4°F | Ht 62.0 in | Wt 205.3 lb

## 2015-11-01 DIAGNOSIS — G8929 Other chronic pain: Secondary | ICD-10-CM | POA: Diagnosis not present

## 2015-11-01 DIAGNOSIS — M549 Dorsalgia, unspecified: Secondary | ICD-10-CM | POA: Diagnosis not present

## 2015-11-01 DIAGNOSIS — R7303 Prediabetes: Secondary | ICD-10-CM

## 2015-11-01 DIAGNOSIS — E669 Obesity, unspecified: Secondary | ICD-10-CM

## 2015-11-01 DIAGNOSIS — M47816 Spondylosis without myelopathy or radiculopathy, lumbar region: Secondary | ICD-10-CM | POA: Diagnosis not present

## 2015-11-01 LAB — POCT GLYCOSYLATED HEMOGLOBIN (HGB A1C): Hemoglobin A1C: 5.8

## 2015-11-01 MED ORDER — BACLOFEN 10 MG PO TABS: 5.0000 mg | ORAL_TABLET | Freq: Two times a day (BID) | ORAL | Status: DC | PRN

## 2015-11-01 MED ORDER — TRAMADOL HCL 50 MG PO TABS: 50.0000 mg | ORAL_TABLET | Freq: Three times a day (TID) | ORAL | Status: DC | PRN

## 2015-11-01 NOTE — Progress Notes (Signed)
Subjective:    Patient ID: Wendy [name], female    DOB: [birth year], 55 y.o.   MRN: [id number]  Wendy [name] is a 55 y.o. female presenting on 11/01/2015 for Follow-up Hyperglycemia / Chronic Pain  HPI  HISTORY OF ELEVATED GLUCOSE - No prior diagnosis of DM. Recently during routine blood work, non-fasting, she was found to have hyperglycemia with elevated glucose to 265 (10/17/15) - Weight is down 10 lbs in 1 month, but relatively stable within past 9 months with +5 lb gain - Limited regular exercise due to chronic pain - Denies significant polyphagia / polydipsia  FOLLOW-UP / BACK PAIN, CHRONIC / RECURRENT FALLS: - Known chronic problems with chronic back pain with lumbar DJD, see prior notes with additional contributing history including osteopenia s/p repaired compression frx, frequent falls with h/o TBI. Followed by PM&R and Neurology. - Today reports chronic persistent low back pain with some worsening. Intermittent radiating pain down Left leg only. Limiting her activities. Ambulates with walker 100% of time, per Neuro recs. Awaiting home health eval by [facility] for improving home safety/mobility and gait training. - Taking Tramadol 50-100mg  2-3x daily with some relief, seems less effective now. Also taking Flexeril 10mg  nightly with help sleeping. Not regularly taking Tylenol. - Denies any numbness or weakness, bladder or bowel incontinence, saddle anesthesia  TOBACCO ABUSE - Resumed smoking end of 2016, started on Chantix, still taking only daily Chantix 1mg  now no longer BID. Smoking significantly reduced to 1-3 cigs daily. Motivated to quit smoking again.  Social History  Substance Use Topics  . Smoking status: Current Every Day Smoker -- 0.25 packs/day for 10 years    Types: Cigarettes    Last Attempt to Quit: 12/23/2008  . Smokeless tobacco: Never Used  . Alcohol Use: No     Comment: history of attending AA    Review of Systems Per HPI unless specifically  indicated above     Objective:    BP 134/79 mmHg  Pulse 72  Temp(Src) 98.4 F (36.9 C) (Oral)  Ht 5\' 2"  (1.575 m)  Wt 205 lb 4.8 oz (93.123 kg)  BMI 37.54 kg/m2  Wt Readings from Last 3 Encounters:  11/01/15 205 lb 4.8 oz (93.123 kg)  10/25/15 214 lb (97.07 kg)  10/17/15 216 lb 6.4 oz (98.158 kg)    Physical Exam  Constitutional: She is oriented to person, place, and time. She appears well-developed and well-nourished. No distress.  chronically ill-appearing, obese, comfortable, cooperative, pleasant, NAD  HENT:  Head: Normocephalic and atraumatic.  Cardiovascular: Normal rate, regular rhythm, normal heart sounds and intact distal pulses.   No murmur heard. Pulmonary/Chest: Effort normal and breath sounds normal. No respiratory distress. She has no wheezes. She has no rales.  Abdominal: Soft. Bowel sounds are normal. She exhibits no distension and no mass. There is no tenderness.  Musculoskeletal: She exhibits no edema.  Back: mild +TTP over bilateral Lumbar paraspinal muscles with hypertonicity, reduced ROM flexion / ext low back. SLR left with low back pain without radicular symptoms.  Neurological: She is alert and oriented to person, place, and time.  Skin: Skin is warm and dry. She is not diaphoretic.  Psychiatric: She has a normal mood and affect. Her behavior is normal.  Nursing note and vitals reviewed.  Results for orders placed or performed in visit on 11/01/15  HgB A1c  Result Value Ref Range   Hemoglobin A1C 5.8    *Note: Due to a large number of results and/or encounters  for the requested time period, some results have not been displayed. A complete set of results can be found in Results Review.      Assessment & Plan:   Problem List Items Addressed This Visit    Chronic back pain    Stable to gradual worsening, chronic LBP, known lumbar DJD, h/o compression fractures - Followed by Dr. Letta Pate (PM&R) for nerve blocks, injections  Plan 1. Referral to PT  for Low Back / Core strengthening program, can try to get [facility] PT through current [facility] agency 2. Refilled Tramadol PRN. Counseled on avoid opiates due to sedation risk 3. Discontinue Flexeril. Start trial on Baclofen 5-10mg  BID PRN, cautioned sedation but hoping will be less than flexeril 4. Continue f/u with PM&R as scheduled 5. RTC 3 mo chronic pain, consider switching Lexapro to Cymbalta (asked patient to discuss with behavioral health at next apt, or we can consider this change in future)      Relevant Medications   traMADol (ULTRAM) 50 MG tablet   baclofen (LIORESAL) 10 MG tablet   Other Relevant Orders   Ambulatory referral to Physical Therapy   Lumbar spondylosis    Likely primary underlying cause of chronic LBP. See separate A&P      Relevant Medications   traMADol (ULTRAM) 50 MG tablet   baclofen (LIORESAL) 10 MG tablet   Other Relevant Orders   Ambulatory referral to Physical Therapy   Obesity (BMI 35.0-39.9 without comorbidity) ([place])    Relatively stable weight within past 1 yr, +/- 5-10 lb changes. Now Pre-DM - Lifestyle modifications, diet      Pre-diabetes - Primary    Consistent with new dx Pre-DM with A1c 5.8. Clinically with metabolic syndrome elevated BP, obesity, without dyslipidemia.  Plan: 1. Repeat A1c in 6 months, if increasing can repeat q 3-6 months 2. Counseled on healthy diet changes, given limited exercise      Relevant Orders   HgB A1c (Completed)      Meds ordered this encounter  Medications  . traMADol (ULTRAM) 50 MG tablet    Sig: Take 1-2 tablets (50-100 mg total) by mouth every 8 (eight) hours as needed (For pain.).    Dispense:  120 tablet    Refill:  2  . baclofen (LIORESAL) 10 MG tablet    Sig: Take 0.5-1 tablets (5-10 mg total) by mouth 2 (two) times daily as needed for muscle spasms.    Dispense:  30 each    Refill:  2    Follow up plan: Return in about 3 months (around 02/01/2016).  [name], [place], PGY-3

## 2015-11-01 NOTE — Patient Instructions (Signed)
Thank you for coming in to clinic today.  1. Good news, your A1c is 5.8, which means you have Pre-Diabetes but it is very borderline. - Recommend to re-check A1c in 6 months - Important to try to eat a healthy diet to reduce risk of future blood sugar elevations and progress to Diabetes  2. For back pain - Refilled Tramadol - DISCONTINUE Flexeril - Start taking Baclofen (Lioresal) 10mg  (muscle relaxant) - start with HALF of one pill at night, at night as needed for next 1-3 nights (may make you drowsy, caution with driving) see how it affects you, then if tolerated increase to HALF to ONE WHOLE pill 2 times a day or (every 8-12 hours as needed)  - Ordered Physical Therapy for your BACK, you may ask your current agency to see if they will do this for you.  At next Behavioral Health Follow-up - Please discuss with Dr Ulice Brilliant if switching your Lexapro to Cymbalta or Duloxetine would be beneficial, as it may help your chronic pain better.  Please schedule a follow-up appointment with Dr Parks Ranger in 3 months  If you have any other questions or concerns, please feel free to call the clinic to contact me. You may also schedule an earlier appointment if necessary.  However, if your symptoms get significantly worse, please go to the Emergency Department to seek immediate medical attention.  [name], [place]

## 2015-11-02 ENCOUNTER — Encounter: Payer: Self-pay | Admitting: Family Medicine

## 2015-11-02 NOTE — Assessment & Plan Note (Addendum)
Relatively stable weight within past 1 yr, +/- 5-10 lb changes. Now Pre-DM - Lifestyle modifications, diet

## 2015-11-02 NOTE — Assessment & Plan Note (Addendum)
Stable to gradual worsening, chronic LBP, known lumbar DJD, h/o compression fractures - Followed by Dr. Letta Pate (PM&R) for nerve blocks, injections  Plan 1. Referral to PT for Low Back / Core strengthening program, can try to get [facility] PT through current [facility] agency 2. Refilled Tramadol PRN. Counseled on avoid opiates due to sedation risk 3. Discontinue Flexeril. Start trial on Baclofen 5-10mg  BID PRN, cautioned sedation but hoping will be less than flexeril 4. Continue f/u with PM&R as scheduled 5. RTC 3 mo chronic pain, consider switching Lexapro to Cymbalta (asked patient to discuss with behavioral health at next apt, or we can consider this change in future)

## 2015-11-02 NOTE — Assessment & Plan Note (Signed)
Consistent with new dx Pre-DM with A1c 5.8. Clinically with metabolic syndrome elevated BP, obesity, without dyslipidemia.  Plan: 1. Repeat A1c in 6 months, if increasing can repeat q 3-6 months 2. Counseled on healthy diet changes, given limited exercise

## 2015-11-02 NOTE — Assessment & Plan Note (Signed)
Likely primary underlying cause of chronic LBP. See separate A&P

## 2015-11-04 ENCOUNTER — Ambulatory Visit: Payer: Medicare Other | Admitting: Physical Therapy

## 2015-11-04 DIAGNOSIS — R531 Weakness: Secondary | ICD-10-CM | POA: Diagnosis not present

## 2015-11-04 DIAGNOSIS — R269 Unspecified abnormalities of gait and mobility: Secondary | ICD-10-CM

## 2015-11-04 DIAGNOSIS — M2560 Stiffness of unspecified joint, not elsewhere classified: Secondary | ICD-10-CM

## 2015-11-04 DIAGNOSIS — R262 Difficulty in walking, not elsewhere classified: Secondary | ICD-10-CM

## 2015-11-04 DIAGNOSIS — R2991 Unspecified symptoms and signs involving the musculoskeletal system: Secondary | ICD-10-CM | POA: Diagnosis not present

## 2015-11-04 DIAGNOSIS — R279 Unspecified lack of coordination: Secondary | ICD-10-CM

## 2015-11-04 DIAGNOSIS — R2681 Unsteadiness on feet: Secondary | ICD-10-CM | POA: Diagnosis not present

## 2015-11-04 DIAGNOSIS — M623 Immobility syndrome (paraplegic): Secondary | ICD-10-CM | POA: Diagnosis not present

## 2015-11-04 DIAGNOSIS — R2689 Other abnormalities of gait and mobility: Secondary | ICD-10-CM

## 2015-11-04 DIAGNOSIS — R29818 Other symptoms and signs involving the nervous system: Secondary | ICD-10-CM | POA: Diagnosis not present

## 2015-11-04 DIAGNOSIS — Z7409 Other reduced mobility: Secondary | ICD-10-CM

## 2015-11-04 NOTE — Therapy (Signed)
[facility] [address] [place], [place], [address] Phone: [phone]   Fax:  [phone]  Physical Therapy Treatment  Patient Details  Name: Wendy [name] MRN: [id number] Date of Birth: 02-25-1961 Referring Provider: Sarina Ill, MD  Encounter Date: 11/04/2015      PT End of Session - 11/04/15 1442    Visit Number 15   Date for PT Re-Evaluation 11/08/15   Authorization Type Medicare G-Code & progress report every 10 visits   PT Start Time 1400   PT Stop Time 1440   PT Time Calculation (min) 40 min   Equipment Utilized During Treatment Gait belt   Activity Tolerance Patient tolerated treatment well   Behavior During Therapy Anxious;WFL for tasks assessed/performed      Past Medical History  Diagnosis Date  . Hepatic cirrhosis due to chronic hepatitis C infection ([place])     ETOH causative as well  . Hiatal hernia   . Esophageal stricture     esophageal dysmotility and chronic dysphagia as well  . 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 History of alcohol abuse   . Anxiety and depression   . History of hip fracture   . Allergic rhinitis   . HTN (hypertension)   . Blood transfusion   . Cataract     MILD  . GERD (gastroesophageal reflux disease)   . Osteoporosis   . Ulcer     2007  . Arthritis   . Clotting disorder ([place])     prolonged clotting time due to liver disease  . Paraesophageal hernia   . Compression fracture 09/21/2013  . Hepatic encephalopathy syndrome ([place])   . Paraesophageal hiatal hernia 10/17/2015    Past Surgical History  Procedure Laterality Date  . Orif hip fracture  2010    right x2  . Upper gastrointestinal endoscopy  08/05/2007    esophageal ring, hiatal hernia, portal gastropathy  . Splenectomy      age 36  . Burr hole for subdural hematoma  2004  . Upper gastrointestinal endoscopy  10/14/2011  . Colonoscopy  08/2012    moderatel left colon tics   . Kyphoplasty Bilateral 09/21/2013    Procedure: T12 - L1 KYPHOPLASTY;  Surgeon: Melina Schools, MD;  Location: [facility];  Service: Orthopedics;  Laterality: Bilateral;  . Esophagogastroduodenoscopy (egd) with propofol N/A 01/26/2014    Procedure: ESOPHAGOGASTRODUODENOSCOPY (EGD) WITH PROPOFOL;  Surgeon: Lafayette Dragon, MD;  [facility] END[facility]COPY;  Service: Endoscopy;  Laterality: N/A;    There were no vitals filed for this visit.  Visit Diagnosis:  Abnormality of gait  Unsteadiness  Balance problems  Weakness generalized  Impaired transfers  Stiffness due to immobility  Lack of coordination  Difficulty walking      Subjective Assessment - 11/04/15 1359    Subjective Denies falls; "It was trying to make me fall down, but it didn't."  LLE "feels like something punched me."   Patient Stated Goals She wants to walk better & get off floor   Currently in Pain? No/denies                         [facility] Adult PT Treatment/Exercise - 11/04/15 1403    Berg Balance Test   Sit to Stand Able to stand without using hands and stabilize independently   Standing Unsupported Able to stand safely 2 minutes   Sitting with Back Unsupported but Feet Supported on Floor  or Stool Able to sit safely and securely 2 minutes   Stand to Sit Sits safely with minimal use of hands   Transfers Able to transfer safely, minor use of hands   Standing Unsupported with Eyes Closed Able to stand 10 seconds safely   Standing Ubsupported with Feet Together Able to place feet together independently and stand for 1 minute with supervision   From Standing, Reach Forward with Outstretched Arm Can reach confidently >25 cm (10")   From Standing Position, Pick up Object from [place] to pick up shoe safely and easily   From Standing Position, Turn to Look Behind Over each Shoulder Looks behind from both sides and weight shifts well   Turn 360 Degrees Able to turn 360 degrees safely but slowly   Standing  Unsupported, Alternately Place Feet on Step/Stool Able to complete >2 steps/needs minimal assist   Standing Unsupported, One Foot in Front Able to plae foot ahead of the other independently and hold 30 seconds   Standing on One Leg Tries to lift leg/unable to hold 3 seconds but remains standing independently   Total Score 46   Timed Up and Go Test   Normal TUG (seconds) 21.13  with rollator                PT Education - 11/04/15 1442    Education provided Yes   Education Details Fall prevention strategies; extensive discussion with pt   Person(s) Educated Patient   Methods Explanation;Handout   Comprehension Verbalized understanding          PT Short Term Goals - 10/16/15 1230    PT SHORT TERM GOAL #1   Title Patient demonstrates understanding of initial HEP to address balance, strength & flexibility. (Target Date 10/11/2015)   Baseline MET 10/16/2015 with verbalization.    Time 1   Period Months   Status Partially Met   PT SHORT TERM GOAL #2   Title Patient ambulates 200' with rolling walker with cues to improve safety with community mobility.  (Target Date 10/11/2015)   Baseline met on 10/07/15   Status Achieved   PT SHORT TERM GOAL #3   Title Berg Balance improves to >/= 28/56 to indicate lower fall risk.  (Target Date 10/11/2015)   Baseline 10/07/15: scored 44/56   Status Achieved   PT SHORT TERM GOAL #4   Title Patient performs floor transfers pushing on mat table with minA to improve ability to get up from floor safely.  (Target Date 10/11/2015)   Baseline met on 10/07/15   Status Achieved           PT Long Term Goals - 11/04/15 1444    PT LONG TERM GOAL #1   Title Verbalize understanding of fall prevention strategies in the home.  (Target Date 11/08/2015)   Status Achieved   PT LONG TERM GOAL #2   Title Timed Up & Go without device <16sec to indicate lower fall risk.  (Target Date 11/08/2015)   Status On-going   PT LONG TERM GOAL #3   Title Patient ambulates  around furniture  7' modified indepdent for safe household mobility.  (Target Date 11/08/2015)   Status On-going   PT LONG TERM GOAL #4   Title Patient ambulates 500', negotiates ramps & curbs with rolling walker modified independent for safe community access.  (Target Date 11/08/2015)   Status On-going   PT LONG TERM GOAL #5   Title Berg Balance test >/= 36/56 to indicate lower fall risk.  (  Target Date 11/08/2015)   Status Achieved   PT LONG TERM GOAL #6   Title Patient verbalizes understanding of ongoing HEP / fitness plan.  (Target Date 11/08/2015)   Status On-going               Plan - 11/04/15 1443    Clinical Impression Statement Pt has met 2 LTGs at this time.  Anticipate d/c next visit.   PT Next Visit Plan Address getting off floor which is one of patient's goals. Continue toward LTGs. Address barriers again with rollator, possibly outdoors. Check remaining goals; d/c g code   Consulted and Agree with Plan of Care Patient        Problem List Patient Active Problem List   Diagnosis Date Noted  . Pre-diabetes 11/01/2015  . TBI (traumatic brain injury) ([place]) 10/25/2015  . OSA on CPAP 10/25/2015  . Paraesophageal hiatal hernia 10/17/2015  . Chronic low back pain without sciatica 06/28/2015  . OSA (obstructive sleep apnea) 06/12/2015  . Excessive daytime sleepiness 04/11/2015  . Episodic cluster headache, not intractable 04/11/2015  . Obesity, morbid ([place]) 04/11/2015  . Snoring 04/11/2015  . Insomnia 12/27/2014  . Hypothyroidism 09/04/2014  . Cognitive and behavioral changes 07/17/2014  . Confusion 07/17/2014  . History of traumatic brain injury 07/10/2014  . Recurrent falls 06/29/2014  . Osteopenia 04/04/2014  . Esophageal varices in cirrhosis - trace 03/19/2014  . Anemia 02/01/2014  . Chronic kidney disease, unspecified ([place]) 02/01/2014  . Chronic back pain 02/01/2014  . Stricture and stenosis of esophagus 01/26/2014  . Fall at home 01/24/2014  . Near  syncope 01/12/2014  . Loss of weight 01/12/2014  . Constipation 11/18/2013  . Compression fracture 09/21/2013  . Cirrhosis ([place]) 09/18/2013  . Atypical chest pain 07/26/2013  . Ascites 07/19/2013  . Burn 07/03/2013  . Dyspnea 02/03/2013  . Abnormal stress echo 01/19/2013  . Tobacco abuse 10/22/2012  . Allergic conjunctivitis 06/19/2012  . Skin lesion 06/19/2012  . Lumbar spondylosis 12/24/2011  . Obesity (BMI 35.0-39.9 without comorbidity) ([place]) 12/08/2011  . Right hip pain 12/01/2011  . Chronic hepatitis C - genotype 1A 10/06/2011  . GERD (gastroesophageal reflux disease) 10/06/2011  . Hepatic encephalopathy syndrome ([place]) 07/28/2011  . Back pain 04/07/2011  . 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 THROMBOCYTOPENIA 10/21/2006  . Depression, major, recurrent, moderate ([place]) 10/21/2006  . HYPERTENSION, BENIGN SYSTEMIC 10/21/2006  . Hepatic cirrhosis ([place]) 10/21/2006  . Generalized pruritus 10/21/2006   Laureen Abrahams, PT, DPT 11/04/2015 2:46 PM  [facility] [address] [place], [place], [address] Phone: [phone]   Fax:  [phone]  Name: SHAKAYLA HICKOX MRN: [id number] Date of Birth: 1960/11/11

## 2015-11-04 NOTE — Patient Instructions (Signed)

## 2015-11-06 ENCOUNTER — Encounter: Payer: Self-pay | Admitting: Physical Therapy

## 2015-11-06 ENCOUNTER — Ambulatory Visit: Payer: Medicare Other | Admitting: Physical Therapy

## 2015-11-06 DIAGNOSIS — H353131 Nonexudative age-related macular degeneration, bilateral, early dry stage: Secondary | ICD-10-CM | POA: Diagnosis not present

## 2015-11-06 DIAGNOSIS — R2689 Other abnormalities of gait and mobility: Secondary | ICD-10-CM

## 2015-11-06 DIAGNOSIS — R29818 Other symptoms and signs involving the nervous system: Secondary | ICD-10-CM | POA: Diagnosis not present

## 2015-11-06 DIAGNOSIS — R269 Unspecified abnormalities of gait and mobility: Secondary | ICD-10-CM

## 2015-11-06 DIAGNOSIS — M623 Immobility syndrome (paraplegic): Secondary | ICD-10-CM | POA: Diagnosis not present

## 2015-11-06 DIAGNOSIS — R531 Weakness: Secondary | ICD-10-CM | POA: Diagnosis not present

## 2015-11-06 DIAGNOSIS — R2991 Unspecified symptoms and signs involving the musculoskeletal system: Secondary | ICD-10-CM

## 2015-11-06 DIAGNOSIS — R2681 Unsteadiness on feet: Secondary | ICD-10-CM | POA: Diagnosis not present

## 2015-11-06 DIAGNOSIS — Z7409 Other reduced mobility: Secondary | ICD-10-CM

## 2015-11-06 DIAGNOSIS — M2560 Stiffness of unspecified joint, not elsewhere classified: Secondary | ICD-10-CM

## 2015-11-06 DIAGNOSIS — H40013 Open angle with borderline findings, low risk, bilateral: Secondary | ICD-10-CM | POA: Diagnosis not present

## 2015-11-06 NOTE — Therapy (Signed)
[facility] [address] [place], [place], [address] Phone: [phone]   Fax:  [phone]  Physical Therapy Treatment  Patient Details  Name: Wendy [name] MRN: [id number] Date of Birth: 12/06/1960 Referring Provider: Sarina Ill, MD  Encounter Date: 11/06/2015      PT End of Session - 11/06/15 1230    Visit Number 16   Number of Visits 18   Date for PT Re-Evaluation 11/08/15   Authorization Type Medicare G-Code & progress report every 10 visits   PT Start Time 1146   PT Stop Time 1230   PT Time Calculation (min) 44 min   Equipment Utilized During Treatment Gait belt   Activity Tolerance Patient tolerated treatment well   Behavior During Therapy Anxious;WFL for tasks assessed/performed      Past Medical History  Diagnosis Date  . Hepatic cirrhosis due to chronic hepatitis C infection ([place])     ETOH causative as well  . Hiatal hernia   . Esophageal stricture     esophageal dysmotility and chronic dysphagia as well  . 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 History of alcohol abuse   . Anxiety and depression   . History of hip fracture   . Allergic rhinitis   . HTN (hypertension)   . Blood transfusion   . Cataract     MILD  . GERD (gastroesophageal reflux disease)   . Osteoporosis   . Ulcer     2007  . Arthritis   . Clotting disorder ([place])     prolonged clotting time due to liver disease  . Paraesophageal hernia   . Compression fracture 09/21/2013  . Hepatic encephalopathy syndrome ([place])   . Paraesophageal hiatal hernia 10/17/2015    Past Surgical History  Procedure Laterality Date  . Orif hip fracture  2010    right x2  . Upper gastrointestinal endoscopy  08/05/2007    esophageal ring, hiatal hernia, portal gastropathy  . Splenectomy      age 75  . Burr hole for subdural hematoma  2004  . Upper gastrointestinal endoscopy  10/14/2011  . Colonoscopy  08/2012   moderatel left colon tics  . Kyphoplasty Bilateral 09/21/2013    Procedure: T12 - L1 KYPHOPLASTY;  Surgeon: Melina Schools, MD;  Location: [facility];  Service: Orthopedics;  Laterality: Bilateral;  . Esophagogastroduodenoscopy (egd) with propofol N/A 01/26/2014    Procedure: ESOPHAGOGASTRODUODENOSCOPY (EGD) WITH PROPOFOL;  Surgeon: Lafayette Dragon, MD;  Location: [facility] ENDOSCOPY;  Service: Endoscopy;  Laterality: N/A;    There were no vitals filed for this visit.  Visit Diagnosis:  Abnormality of gait  Unsteadiness  Balance problems  Weakness generalized  Impaired transfers  Stiffness due to immobility      Subjective Assessment - 11/06/15 1156    Subjective No falls. Saw doctor Friday and A1C was okay.    Limitations Standing;Walking   Patient Stated Goals She wants to walk better & get off floor   Currently in Pain? No/denies            [facility] PT Assessment - 11/06/15 1145    Ambulation/Gait   Ambulation/Gait Yes   Ambulation/Gait Assistance 6: Modified independent (Device/Increase time)   Ambulation Distance (Feet) 500 Feet   Assistive device Rollator   Gait Pattern Step-through pattern;Decreased stride length;Trunk flexed   Ambulation Surface Indoor;Level   Ramp 6: Modified independent (Device)  rollotor   Curb 6: Modified independent (Device/increase time)  rollator  Timed Up and Go Test   TUG Normal TUG   Normal TUG (seconds) 15.92  13.35 sec 2nd trial                               PT Short Term Goals - 10/16/15 1230    PT SHORT TERM GOAL #1   Title Patient demonstrates understanding of initial HEP to address balance, strength & flexibility. (Target Date 10/11/2015)   Baseline MET 10/16/2015 with verbalization.    Time 1   Period Months   Status Partially Met   PT SHORT TERM GOAL #2   Title Patient ambulates 200' with rolling walker with cues to improve safety with community mobility.  (Target Date 10/11/2015)   Baseline met on 10/07/15    Status Achieved   PT SHORT TERM GOAL #3   Title Berg Balance improves to >/= 28/56 to indicate lower fall risk.  (Target Date 10/11/2015)   Baseline 10/07/15: scored 44/56   Status Achieved   PT SHORT TERM GOAL #4   Title Patient performs floor transfers pushing on mat table with minA to improve ability to get up from floor safely.  (Target Date 10/11/2015)   Baseline met on 10/07/15   Status Achieved           PT Long Term Goals - 11/06/15 1230    PT LONG TERM GOAL #1   Title Verbalize understanding of fall prevention strategies in the home.  (Target Date 11/08/2015)   Status Achieved   PT LONG TERM GOAL #2   Title Timed Up & Go without device <16sec to indicate lower fall risk.  (Target Date 11/08/2015)   Baseline MET 11/06/2015   Status Achieved   PT LONG TERM GOAL #3   Title Patient ambulates around furniture  34' modified indepdent for safe household mobility.  (Target Date 11/08/2015)   Baseline MET 11/06/2015 PT recommended use of rollator to decrease fall risk.   Status Achieved   PT LONG TERM GOAL #4   Title Patient ambulates 500', negotiates ramps & curbs with rolling walker modified independent for safe community access.  (Target Date 11/08/2015)   Baseline MET 11/06/2015 with rollator walker   Status Achieved   PT LONG TERM GOAL #5   Title Berg Balance test >/= 36/56 to indicate lower fall risk.  (Target Date 11/08/2015)   Baseline MET Berg Balance 46 /56   Status Achieved   PT LONG TERM GOAL #6   Title Patient verbalizes understanding of ongoing HEP / fitness plan.  (Target Date 11/08/2015)   Baseline MET 11/06/2015   Status Achieved               Plan - 11/06/15 1230    Clinical Impression Statement Patient met all LTGs. Patient verbalizes understanding of recommendations for ongoing HEP /fitness plan and use of rollator walker.    Pt will benefit from skilled therapeutic intervention in order to improve on the following deficits Abnormal gait;Decreased activity  tolerance;Decreased balance;Decreased endurance;Decreased knowledge of precautions;Decreased mobility;Decreased knowledge of use of DME;Decreased strength;Impaired flexibility;Postural dysfunction   Rehab Potential Good   PT Frequency 2x / week   PT Duration Other (comment)   PT Treatment/Interventions ADLs/Self Care Home Management;DME Instruction;Gait training;Stair training;Functional mobility training;Therapeutic activities;Therapeutic exercise;Balance training;Neuromuscular re-education;Patient/family education;Vestibular   PT Next Visit Plan Discharge PT   Consulted and Agree with Plan of Care Patient          G-Codes -  11/06/15 1230    Functional Assessment Tool Used Berg Balance 46/56, TUG 15.92sec without device   Functional Limitation Mobility: Walking and moving around   Mobility: Walking and Moving Around Goal Status [phone]) At least 40 percent but less than 60 percent impaired, limited or restricted   Mobility: Walking and Moving Around Discharge Status [phone]) At least 40 percent but less than 60 percent impaired, limited or restricted      Problem List Patient Active Problem List   Diagnosis Date Noted  . Pre-diabetes 11/01/2015  . TBI (traumatic brain injury) ([place]) 10/25/2015  . OSA on CPAP 10/25/2015  . Paraesophageal hiatal hernia 10/17/2015  . Chronic low back pain without sciatica 06/28/2015  . OSA (obstructive sleep apnea) 06/12/2015  . Excessive daytime sleepiness 04/11/2015  . Episodic cluster headache, not intractable 04/11/2015  . Obesity, morbid ([place]) 04/11/2015  . Snoring 04/11/2015  . Insomnia 12/27/2014  . Hypothyroidism 09/04/2014  . Cognitive and behavioral changes 07/17/2014  . Confusion 07/17/2014  . History of traumatic brain injury 07/10/2014  . Recurrent falls 06/29/2014  . Osteopenia 04/04/2014  . Esophageal varices in cirrhosis - trace 03/19/2014  . Anemia 02/01/2014  . Chronic kidney disease, unspecified ([place]) 02/01/2014  . Chronic  back pain 02/01/2014  . Stricture and stenosis of esophagus 01/26/2014  . Fall at home 01/24/2014  . Near syncope 01/12/2014  . Loss of weight 01/12/2014  . Constipation 11/18/2013  . Compression fracture 09/21/2013  . Cirrhosis ([place]) 09/18/2013  . Atypical chest pain 07/26/2013  . Ascites 07/19/2013  . Burn 07/03/2013  . Dyspnea 02/03/2013  . Abnormal stress echo 01/19/2013  . Tobacco abuse 10/22/2012  . Allergic conjunctivitis 06/19/2012  . Skin lesion 06/19/2012  . Lumbar spondylosis 12/24/2011  . Obesity (BMI 35.0-39.9 without comorbidity) ([place]) 12/08/2011  . Right hip pain 12/01/2011  . Chronic hepatitis C - genotype 1A 10/06/2011  . GERD (gastroesophageal reflux disease) 10/06/2011  . Hepatic encephalopathy syndrome ([place]) 07/28/2011  . Back pain 04/07/2011  . 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 THROMBOCYTOPENIA 10/21/2006  . Depression, major, recurrent, moderate ([place]) 10/21/2006  . HYPERTENSION, BENIGN SYSTEMIC 10/21/2006  . Hepatic cirrhosis ([place]) 10/21/2006  . Generalized pruritus 10/21/2006    Nidhi Jacome PT, DPT 11/06/2015, 9:39 PM  [facility] [address] [place], [place], [address] Phone: [phone]   Fax:  [phone]  Name: Wendy [name] MRN: [id number] Date of Birth: [birth year]   PHYSICAL THERAPY DISCHARGE SUMMARY  Visits from Start of Care: 16  Current functional level related to goals / functional outcomes: See above   Remaining deficits: See above   Education / Equipment: HEP / fitness plan and fall prevention strategies including use of rollator walker Plan: Patient agrees to discharge.  Patient goals were met. Patient is being discharged due to meeting the stated rehab goals.  ?????

## 2015-11-07 ENCOUNTER — Other Ambulatory Visit: Payer: Self-pay | Admitting: Family Medicine

## 2015-11-07 DIAGNOSIS — K5900 Constipation, unspecified: Secondary | ICD-10-CM

## 2015-11-12 ENCOUNTER — Ambulatory Visit: Payer: Medicare Other | Admitting: Physical Therapy

## 2015-11-12 DIAGNOSIS — R29818 Other symptoms and signs involving the nervous system: Secondary | ICD-10-CM | POA: Diagnosis not present

## 2015-11-12 DIAGNOSIS — R531 Weakness: Secondary | ICD-10-CM | POA: Diagnosis not present

## 2015-11-12 DIAGNOSIS — R269 Unspecified abnormalities of gait and mobility: Secondary | ICD-10-CM | POA: Diagnosis not present

## 2015-11-12 DIAGNOSIS — R2991 Unspecified symptoms and signs involving the musculoskeletal system: Secondary | ICD-10-CM | POA: Diagnosis not present

## 2015-11-12 DIAGNOSIS — M545 Low back pain, unspecified: Secondary | ICD-10-CM

## 2015-11-12 DIAGNOSIS — M47817 Spondylosis without myelopathy or radiculopathy, lumbosacral region: Secondary | ICD-10-CM

## 2015-11-12 DIAGNOSIS — M6281 Muscle weakness (generalized): Secondary | ICD-10-CM

## 2015-11-12 DIAGNOSIS — R2681 Unsteadiness on feet: Secondary | ICD-10-CM | POA: Diagnosis not present

## 2015-11-12 DIAGNOSIS — R262 Difficulty in walking, not elsewhere classified: Secondary | ICD-10-CM

## 2015-11-12 DIAGNOSIS — M623 Immobility syndrome (paraplegic): Secondary | ICD-10-CM | POA: Diagnosis not present
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
## 2015-11-12 NOTE — Patient Instructions (Signed)
Wendy [name] PT [facility] [address], [address] , [place] [address] Phone # [phone] Fax [phone]    

## 2015-11-14 ENCOUNTER — Ambulatory Visit: Payer: Medicare Other | Admitting: Physical Therapy

## 2015-11-14 DIAGNOSIS — M545 Low back pain, unspecified: Secondary | ICD-10-CM

## 2015-11-14 DIAGNOSIS — R531 Weakness: Secondary | ICD-10-CM | POA: Diagnosis not present

## 2015-11-14 DIAGNOSIS — R2681 Unsteadiness on feet: Secondary | ICD-10-CM | POA: Diagnosis not present

## 2015-11-14 DIAGNOSIS — R2991 Unspecified symptoms and signs involving the musculoskeletal system: Secondary | ICD-10-CM | POA: Diagnosis not present

## 2015-11-14 DIAGNOSIS — R269 Unspecified abnormalities of gait and mobility: Secondary | ICD-10-CM | POA: Diagnosis not present

## 2015-11-14 DIAGNOSIS — M6281 Muscle weakness (generalized): Secondary | ICD-10-CM

## 2015-11-14 DIAGNOSIS — M623 Immobility syndrome (paraplegic): Secondary | ICD-10-CM | POA: Diagnosis not present

## 2015-11-14 DIAGNOSIS — R262 Difficulty in walking, not elsewhere classified: Secondary | ICD-10-CM

## 2015-11-14 DIAGNOSIS — M47817 Spondylosis without myelopathy or radiculopathy, lumbosacral region: Secondary | ICD-10-CM

## 2015-11-14 DIAGNOSIS — R29818 Other symptoms and signs involving the nervous system: Secondary | ICD-10-CM | POA: Diagnosis not present

## 2015-11-15 ENCOUNTER — Telehealth: Payer: Self-pay | Admitting: Family Medicine

## 2015-11-15 NOTE — Telephone Encounter (Signed)
Attempted to call patient, did not reach, LVM.  Patient is now followed by Dr Tomi Likens [facility]), she established care with him earlier this month on 10/25/15. The diagnosis she is referring to is "Traumatic Brain Injury with Cognitive Impairment", we have discussed this many times, but she does have issues with memory loss as well. This is a chronic problem that will gradually worsen and not improve, unfortunately.  I am not exactly sure about the hernia, she has a history of hiatal hernia, and is being treated with PPI medicine, and she is on some chronic pain medicine. There is not much else treatment for this problem, she will need to follow-up with her GI to discuss potential surgical treatment in future, however she is not a good surgical candidate.  [name], [place], PGY-3

## 2015-11-15 NOTE — Telephone Encounter (Signed)
Would like to talk to dr Jeronimo Norma about her "unknown" diagnosis. This diagnosis was given by a neurologist that Dr Raliegh Ip sent her to.  The neurologist told her this diagnosis "' was not going away".  She also think her hernia is acting up because she is having pain more often.   Please advise.  (pt did not say what the diagnosis was)
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
## 2015-11-18 ENCOUNTER — Ambulatory Visit: Payer: Self-pay | Admitting: Adult Health

## 2015-11-20 ENCOUNTER — Ambulatory Visit: Payer: Medicare Other | Admitting: Physical Therapy

## 2015-11-20 ENCOUNTER — Telehealth: Payer: Self-pay | Admitting: Neurology

## 2015-11-20 ENCOUNTER — Encounter: Payer: Self-pay | Admitting: Physical Therapy

## 2015-11-20 DIAGNOSIS — M545 Low back pain, unspecified: Secondary | ICD-10-CM

## 2015-11-20 DIAGNOSIS — R2681 Unsteadiness on feet: Secondary | ICD-10-CM | POA: Diagnosis not present

## 2015-11-20 DIAGNOSIS — R29818 Other symptoms and signs involving the nervous system: Secondary | ICD-10-CM | POA: Diagnosis not present

## 2015-11-20 DIAGNOSIS — R531 Weakness: Secondary | ICD-10-CM | POA: Diagnosis not present

## 2015-11-20 DIAGNOSIS — R269 Unspecified abnormalities of gait and mobility: Secondary | ICD-10-CM

## 2015-11-20 DIAGNOSIS — M47817 Spondylosis without myelopathy or radiculopathy, lumbosacral region: Secondary | ICD-10-CM

## 2015-11-20 DIAGNOSIS — R2991 Unspecified symptoms and signs involving the musculoskeletal system: Secondary | ICD-10-CM | POA: Diagnosis not present

## 2015-11-20 DIAGNOSIS — R2689 Other abnormalities of gait and mobility: Secondary | ICD-10-CM

## 2015-11-20 DIAGNOSIS — R262 Difficulty in walking, not elsewhere classified: Secondary | ICD-10-CM

## 2015-11-20 DIAGNOSIS — M6281 Muscle weakness (generalized): Secondary | ICD-10-CM

## 2015-11-20 DIAGNOSIS — M623 Immobility syndrome (paraplegic): Secondary | ICD-10-CM | POA: Diagnosis not present

## 2015-11-20 NOTE — Therapy (Signed)
[facility] [address] [place], [place], [address] Phone: [phone]   Fax:  [phone]  Physical Therapy Treatment  Patient Details  Name: Wendy [name] MRN: [id number] Date of Birth: 07-Apr-1961 Referring Provider: Dr. Parks Ranger  Encounter Date: 11/20/2015      PT End of Session - 11/20/15 1237    Visit Number 3  needs KX    Number of Visits 10   Date for PT Re-Evaluation 02/03/16   Authorization Type G codes  (needs KX previous PT  at neuro)   PT Start Time 1229   PT Stop Time 1314   PT Time Calculation (min) 45 min   Activity Tolerance Patient tolerated treatment well   Behavior During Therapy [facility] for tasks assessed/performed      Past Medical History  Diagnosis Date  . Hepatic cirrhosis due to chronic hepatitis C infection ([place])     ETOH causative as well  . Hiatal hernia   . Esophageal stricture     esophageal dysmotility and chronic dysphagia as well  . 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 History of alcohol abuse   . Anxiety and depression   . History of hip fracture   . Allergic rhinitis   . HTN (hypertension)   . Blood transfusion   . Cataract     MILD  . GERD (gastroesophageal reflux disease)   . Osteoporosis   . Ulcer     2007  . Arthritis   . Clotting disorder ([place])     prolonged clotting time due to liver disease  . Paraesophageal hernia   . Compression fracture 09/21/2013  . Hepatic encephalopathy syndrome ([place])   . Paraesophageal hiatal hernia 10/17/2015    Past Surgical History  Procedure Laterality Date  . Orif hip fracture  2010    right x2  . Upper gastrointestinal endoscopy  08/05/2007    esophageal ring, hiatal hernia, portal gastropathy  . Splenectomy      age 32  . Burr hole for subdural hematoma  2004  . Upper gastrointestinal endoscopy  10/14/2011  . Colonoscopy  08/2012    moderatel left colon tics  . Kyphoplasty Bilateral  09/21/2013    Procedure: T12 - L1 KYPHOPLASTY;  Surgeon: Melina Schools, MD;  Location: [facility];  Service: Orthopedics;  Laterality: Bilateral;  . Esophagogastroduodenoscopy (egd) with propofol N/A 01/26/2014    Procedure: ESOPHAGOGASTRODUODENOSCOPY (EGD) WITH PROPOFOL;  Surgeon: Lafayette Dragon, MD;  Location: [facility] ENDOSCOPY;  Service: Endoscopy;  Laterality: N/A;    There were no vitals filed for this visit.  Visit Diagnosis:  Bilateral low back pain without sciatica  Lumbosacral spondylosis without myelopathy  Muscle weakness (generalized)  Difficulty in walking  Abnormality of gait  Unsteadiness  Balance problems  Weakness generalized  Difficulty walking      Subjective Assessment - 11/20/15 1229    Subjective Pt is using her 4wheel walker for ambulation, she rates her back pain as 8/10.    Pertinent History frequent falls;  (fell last Saturday after getting out of bed, began shaking, "I froze up";  extensive medical history with co-morbidities;  patient reports decreased memory;  TBI    Limitations House hold activities;Walking;Standing;Sitting   How long can you sit comfortably? 5 min   How long can you stand comfortably? 5 min   How long can you walk comfortably? 50 yards   Diagnostic tests x-rays;  Unsure what other diagnostics where done  Patient Stated Goals back pain to go away so I can move on   Currently in Pain? Yes   Pain Score 7    Pain Location Back   Pain Orientation Right;Left   Pain Descriptors / Indicators Aching;Sore   Pain Type Chronic pain   Pain Radiating Towards up into low back   Pain Onset More than a month ago   Pain Frequency Intermittent   Aggravating Factors  standing, unsure about walking, getting up after sitting   Pain Relieving Factors sitting down is helpin, but prolonged sitting makes it worse   Multiple Pain Sites No                         [facility] Adult PT Treatment/Exercise - 11/20/15 0001    Ambulation/Gait    Ambulation/Gait Yes   Ambulation/Gait Assistance 6: Modified independent (Device/Increase time)   Ambulation Distance (Feet) 80 Feet   Assistive device Rollator   Gait Pattern Step-through pattern;Decreased stride length;Trunk flexed   Ambulation Surface Indoor   Gait velocity --  46sec   Exercises   Exercises Lumbar;Knee/Hip   Lumbar Exercises: Seated   Other Seated Lumbar Exercises 2000gram yellow plyo ball 10x each:  shoulder to shoulder   Other Seated Lumbar Exercises rows and extensions with red band 10x each   Lumbar Exercises: Supine   Ab Set 5 reps   Bridge 10 reps  very limited ROM due to weakness   Knee/Hip Exercises: Aerobic   Nustep Level 1 Seat 6, arms 10 8 min   Knee/Hip Exercises: Standing   Other Standing Knee Exercises psoas doorway stretch 10x each side                  PT Short Term Goals - 11/20/15 1244    PT SHORT TERM GOAL #1   Title The patient will be able to perform initial, basic HEP for hip and core strengthening needed for turning, scooting in bed  12/10/15   Baseline MET 10/16/2015 with verbalization.    Time 4   Period Weeks   Status On-going   PT SHORT TERM GOAL #2   Title The patient will report a 25% improvement in pain with rising from sit to stand   Baseline met on 10/07/15   Time 4   Period Weeks   Status On-going   PT SHORT TERM GOAL #3   Title The patient will have improved hip, trunk ROM needed to put on socks and shoes with min assist from roomate   Baseline 10/07/15: scored 44/56   Time 4   Period Weeks   Status On-going   PT SHORT TERM GOAL #4   Title -----           PT Long Term Goals - 11/15/15 1538    PT LONG TERM GOAL #1   Title The patient will be independent in HEP for core/hip strengthening and flexibility for back pain management  01/07/16   Time 8   Period Weeks   Status On-going   PT LONG TERM GOAL #2   Title The patient will report a 50% improvement in pain with ADLs including getting out of bed and  out of the chair   Time 8   Period Weeks   Status On-going   PT LONG TERM GOAL #3   Title The patient will have core/trunk strength grossly 4/5 needed for standing/walking for 10 minutes with min c/o pain   Time 8  Period Weeks   Status On-going   PT LONG TERM GOAL #4   Title Timed up and go gait speed improved from 29.3 to 16 sec for improved gait safety at home and in the community   Time 8   Period Weeks   Status On-going   PT LONG TERM GOAL #5   Title FOTO functional outcome score improved from 71% limitation to 50% indicating improved function with less pain   Time 8   Period Weeks   Status On-going               Plan - 11/20/15 1240    Clinical Impression Statement High risk of falls. Tight hamstrings and iliopsoas bil, weak abdominals. Pt will continue to benefit from skilled PT. Marland Kitchen    Pt will benefit from skilled therapeutic intervention in order to improve on the following deficits Abnormal gait;Decreased balance;Decreased activity tolerance;Decreased mobility;Decreased strength;Decreased range of motion;Difficulty walking;Pain;Impaired flexibility   Rehab Potential Good   Clinical Impairments Affecting Rehab Potential High risk of falls;  TBI;  numerous co-morbidities right hip fx;  memory loss   PT Frequency 2x / week   PT Duration 8 weeks   PT Treatment/Interventions ADLs/Self Care Home Management;DME Instruction;Gait training;Stair training;Functional mobility training;Therapeutic activities;Therapeutic exercise;Balance training;Neuromuscular re-education;Patient/family education;Manual techniques;Electrical Stimulation;Moist Heat;Ultrasound;Dry needling;Taping   PT Next Visit Plan needs KX every visit secondary to previous neuro PT;  low level core strengthening;  Nu-step;  psoas stretching in doorway;  bridge   PT Home Exercise Plan supine abdominal brace but needs further instruction   Consulted and Agree with Plan of Care Patient        Problem  List Patient Active Problem List   Diagnosis Date Noted  . Pre-diabetes 11/01/2015  . TBI (traumatic brain injury) ([place]) 10/25/2015  . OSA on CPAP 10/25/2015  . Paraesophageal hiatal hernia 10/17/2015  . Chronic low back pain without sciatica 06/28/2015  . OSA (obstructive sleep apnea) 06/12/2015  . Excessive daytime sleepiness 04/11/2015  . Episodic cluster headache, not intractable 04/11/2015  . Obesity, morbid ([place]) 04/11/2015  . Snoring 04/11/2015  . Insomnia 12/27/2014  . Hypothyroidism 09/04/2014  . Cognitive and behavioral changes 07/17/2014  . Confusion 07/17/2014  . History of traumatic brain injury 07/10/2014  . Recurrent falls 06/29/2014  . Osteopenia 04/04/2014  . Esophageal varices in cirrhosis - trace 03/19/2014  . Anemia 02/01/2014  . Chronic kidney disease, unspecified ([place]) 02/01/2014  . Chronic back pain 02/01/2014  . Stricture and stenosis of esophagus 01/26/2014  . Fall at home 01/24/2014  . Near syncope 01/12/2014  . Loss of weight 01/12/2014  . Constipation 11/18/2013  . Compression fracture 09/21/2013  . Cirrhosis ([place]) 09/18/2013  . Atypical chest pain 07/26/2013  . Ascites 07/19/2013  . Burn 07/03/2013  . Dyspnea 02/03/2013  . Abnormal stress echo 01/19/2013  . Tobacco abuse 10/22/2012  . Allergic conjunctivitis 06/19/2012  . Skin lesion 06/19/2012  . Lumbar spondylosis 12/24/2011  . Obesity (BMI 35.0-39.9 without comorbidity) ([place]) 12/08/2011  . Right hip pain 12/01/2011  . Chronic hepatitis C - genotype 1A 10/06/2011  . GERD (gastroesophageal reflux disease) 10/06/2011  . Hepatic encephalopathy syndrome ([place]) 07/28/2011  . Back pain 04/07/2011  . 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 THROMBOCYTOPENIA 10/21/2006  . Depression, major, recurrent, moderate ([place]) 10/21/2006  . HYPERTENSION, BENIGN SYSTEMIC 10/21/2006  . Hepatic cirrhosis ([place]) 10/21/2006  . Generalized pruritus 10/21/2006    NAUMANN-HOUEGNIFIO,Yanice Maqueda PTA 11/20/2015,  1:16 PM  [facility] [address], [place], [place], [address] Phone: [phone]   Fax:  [phone]  Name: Wendy [name] MRN: [id number] Date of Birth: 04/14/1961

## 2015-11-20 NOTE — Telephone Encounter (Signed)
Patient want someone call Stacy at [phone]. Before 12:00 today so she need know what to work on with patient  Marzetta Board is her physical therapist  Pt call back number [phone]

## 2015-11-20 NOTE — Telephone Encounter (Signed)
Did call and speak to [facility]. Marzetta Board is off today. Did let receptionist know what was going on. States since we did not refer pt, they do not need anything from [place]. Did let them know she was just to continue PT due to gait abnormalities and frequent falls.

## 2015-11-25 ENCOUNTER — Other Ambulatory Visit: Payer: Self-pay | Admitting: *Deleted

## 2015-11-25 ENCOUNTER — Ambulatory Visit: Payer: Medicare Other | Admitting: Physical Therapy

## 2015-11-25 DIAGNOSIS — I10 Essential (primary) hypertension: Secondary | ICD-10-CM

## 2015-11-25 MED ORDER — CARVEDILOL 12.5 MG PO TABS: 12.5000 mg | ORAL_TABLET | Freq: Two times a day (BID) | ORAL | Status: DC

## 2015-11-26 ENCOUNTER — Ambulatory Visit: Payer: Medicare Other | Admitting: Physical Medicine & Rehabilitation

## 2015-11-27 ENCOUNTER — Encounter: Payer: Self-pay | Admitting: Physical Therapy

## 2015-11-27 ENCOUNTER — Ambulatory Visit: Payer: Medicare Other | Attending: Family Medicine | Admitting: Physical Therapy

## 2015-11-27 DIAGNOSIS — M47817 Spondylosis without myelopathy or radiculopathy, lumbosacral region: Secondary | ICD-10-CM | POA: Insufficient documentation

## 2015-11-27 DIAGNOSIS — R269 Unspecified abnormalities of gait and mobility: Secondary | ICD-10-CM | POA: Diagnosis not present

## 2015-11-27 DIAGNOSIS — M545 Low back pain, unspecified: Secondary | ICD-10-CM

## 2015-11-27 DIAGNOSIS — R2681 Unsteadiness on feet: Secondary | ICD-10-CM | POA: Diagnosis not present

## 2015-11-27 DIAGNOSIS — R2689 Other abnormalities of gait and mobility: Secondary | ICD-10-CM

## 2015-11-27 DIAGNOSIS — R262 Difficulty in walking, not elsewhere classified: Secondary | ICD-10-CM | POA: Diagnosis not present

## 2015-11-27 DIAGNOSIS — R531 Weakness: Secondary | ICD-10-CM | POA: Insufficient documentation

## 2015-11-27 DIAGNOSIS — M6281 Muscle weakness (generalized): Secondary | ICD-10-CM | POA: Diagnosis not present

## 2015-11-27 DIAGNOSIS — R2991 Unspecified symptoms and signs involving the musculoskeletal system: Secondary | ICD-10-CM | POA: Insufficient documentation

## 2015-11-27 DIAGNOSIS — R29818 Other symptoms and signs involving the nervous system: Secondary | ICD-10-CM | POA: Diagnosis not present

## 2015-11-27 DIAGNOSIS — M623 Immobility syndrome (paraplegic): Secondary | ICD-10-CM | POA: Diagnosis not present

## 2015-11-27 DIAGNOSIS — R279 Unspecified lack of coordination: Secondary | ICD-10-CM

## 2015-11-27 NOTE — Patient Instructions (Addendum)
      Copyright  VHI. All rights reserved.  Strengthening: Resisted Extension   Perform in sitting.  BOTH arms at the same time not see in picture Pull both arms back, elbow straight. Repeat __10__ times per set. Do _2-3___ sets per session. Do __2__ sessions per day.  http://orth.exer.us/832   Copyright  VHI. All rights reserved.    Low Row: Both  Arm's not seen in picture   Perform in sitting. Palm up, pull  BOTH arms back while squeezing shoulder blades together. Repeat 10__ times per set. Do _2-3_ sets per session.

## 2015-11-27 NOTE — Therapy (Addendum)
[facility] [address], [place], [place], [address] Phone: [phone]   Fax:  [phone]  Physical Therapy Treatment/Recertification  Patient Details  Name: Wendy [name] MRN: [id number] Date of Birth: 1961/05/22 Referring Provider: Dr. Parks Ranger  Encounter Date: 11/27/2015      PT End of Session - 11/27/15 1233    Visit Number 4   Number of Visits 10   Date for PT Re-Evaluation 22-Jan-2016   Authorization Type G codes  (needs KX previous PT  at neuro)   PT Start Time 1229   PT Stop Time 1314   PT Time Calculation (min) 45 min   Equipment Utilized During Treatment Gait belt   Activity Tolerance Patient tolerated treatment well   Behavior During Therapy [facility] for tasks assessed/performed      Past Medical History  Diagnosis Date  . Hepatic cirrhosis due to chronic hepatitis C infection ([place])     ETOH causative as well  . Hiatal hernia   . Esophageal stricture     esophageal dysmotility and chronic dysphagia as well  . 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 History of alcohol abuse   . Anxiety and depression   . History of hip fracture   . Allergic rhinitis   . HTN (hypertension)   . Blood transfusion   . Cataract     MILD  . GERD (gastroesophageal reflux disease)   . Osteoporosis   . Ulcer     2007  . Arthritis   . Clotting disorder ([place])     prolonged clotting time due to liver disease  . Paraesophageal hernia   . Compression fracture 09/21/2013  . Hepatic encephalopathy syndrome ([place])   . Paraesophageal hiatal hernia 10/17/2015    Past Surgical History  Procedure Laterality Date  . Orif hip fracture  2010    right x2  . Upper gastrointestinal endoscopy  08/05/2007    esophageal ring, hiatal hernia, portal gastropathy  . Splenectomy      age 40  . Burr hole for subdural hematoma  2004  . Upper gastrointestinal endoscopy  10/14/2011  . Colonoscopy  08/2012     moderatel left colon tics  . Kyphoplasty Bilateral 09/21/2013    Procedure: T12 - L1 KYPHOPLASTY;  Surgeon: Melina Schools, MD;  Location: [facility];  Service: Orthopedics;  Laterality: Bilateral;  . Esophagogastroduodenoscopy (egd) with propofol N/A 01/26/2014    Procedure: ESOPHAGOGASTRODUODENOSCOPY (EGD) WITH PROPOFOL;  Surgeon: Lafayette Dragon, MD;  Location: [facility] ENDOSCOPY;  Service: Endoscopy;  Laterality: N/A;    There were no vitals filed for this visit.  Visit Diagnosis:  Bilateral low back pain without sciatica  Lumbosacral spondylosis without myelopathy  Muscle weakness (generalized)  Difficulty in walking  Abnormality of gait  Unsteadiness  Balance problems  Weakness generalized  Difficulty walking  Impaired transfers  Stiffness due to immobility  Lack of coordination      Subjective Assessment - 11/27/15 1231    Subjective Pt arrived with her 4wheel walker for ambulation, pain in low back rated as 6/10   Pertinent History frequent falls;  (fell last Saturday after getting out of bed, began shaking, "I froze up";  extensive medical history with co-morbidities;  patient reports decreased memory;  TBI    Limitations House hold activities;Walking;Standing;Sitting   How long can you sit comfortably? 5 min   How long can you stand comfortably? 5 min   How long can you walk comfortably? [place]  yards   Diagnostic tests x-rays;  Unsure what other diagnostics where done   Patient Stated Goals back pain to go away so I can move on   Currently in Pain? Yes   Pain Score 6    Pain Location Back   Pain Orientation Right;Left   Pain Descriptors / Indicators Aching;Sore   Pain Type Chronic pain   Pain Onset More than a month ago   Pain Frequency Intermittent   Aggravating Factors  standing, unsure about walking, getting up after sitting   Pain Relieving Factors sitting down is helping, but prolonged sitting makes it worse.   Multiple Pain Sites No                          [facility] Adult PT Treatment/Exercise - 11/27/15 0001    Ambulation/Gait   Ambulation/Gait Yes   Ambulation/Gait Assistance 6: Modified independent (Device/Increase time)   Ambulation Distance (Feet) 80 Feet   Assistive device Rollator   Gait Pattern Step-through pattern;Decreased stride length;Trunk flexed   Ambulation Surface Indoor   Gait velocity 41sec   Exercises   Exercises Lumbar;Knee/Hip   Lumbar Exercises: Seated   Other Seated Lumbar Exercises 2000gram yellow plyo ball 10x each:  shoulder to shoulder   Other Seated Lumbar Exercises rows and extensions with red band 10x each   Lumbar Exercises: Supine   Ab Set 5 reps   Bridge 10 reps   Knee/Hip Exercises: Aerobic   Nustep Level 1 Seat 7, arms 9, 8 min 0.31mh   Knee/Hip Exercises: Standing   Other Standing Knee Exercises psoas doorway stretch 10x each side  needs vc's & tactile cues for technique   Knee/Hip Exercises: Seated   Sit to Sand --  Rockerboard x 3 min in sitting                PT Education - 11/27/15 1301    Education Details bil UE extension and rows   Person(s) Educated Patient   Methods Explanation;Demonstration;Tactile cues;Verbal cues;Handout   Comprehension Verbalized understanding          PT Short Term Goals - 11/20/15 1244    PT SHORT TERM GOAL #1   Title The patient will be able to perform initial, basic HEP for hip and core strengthening needed for turning, scooting in bed  12/10/15   Baseline MET 10/16/2015 with verbalization.    Time 4   Period Weeks   Status On-going   PT SHORT TERM GOAL #2   Title The patient will report a 25% improvement in pain with rising from sit to stand   Baseline met on 10/07/15   Time 4   Period Weeks   Status On-going   PT SHORT TERM GOAL #3   Title The patient will have improved hip, trunk ROM needed to put on socks and shoes with min assist from roomate   Baseline 10/07/15: scored 44/56   Time 4   Period Weeks    Status On-going   PT SHORT TERM GOAL #4   Title -----           PT Long Term Goals - 11/15/15 1538    PT LONG TERM GOAL #1   Title The patient will be independent in HEP for core/hip strengthening and flexibility for back pain management  01/07/16   Time 8   Period Weeks   Status On-going   PT LONG TERM GOAL #2   Title The patient will report  a 50% improvement in pain with ADLs including getting out of bed and out of the chair   Time 8   Period Weeks   Status On-going   PT LONG TERM GOAL #3   Title The patient will have core/trunk strength grossly 4/5 needed for standing/walking for 10 minutes with min c/o pain   Time 8   Period Weeks   Status On-going   PT LONG TERM GOAL #4   Title Timed up and go gait speed improved from 29.3 to 16 sec for improved gait safety at home and in the community   Time 8   Period Weeks   Status On-going   PT LONG TERM GOAL #5   Title FOTO functional outcome score improved from 71% limitation to 50% indicating improved function with less pain   Time 8   Period Weeks   Status On-going        Plan - 12/02/15 1953    Clinical Impression Statement high risk of falls. tight hamstrings and  iliopsoas bil, weak abdominals. Pt will continue to benefit from skilled  PT   Rehab Potential Good   PT Frequency 2x / week   PT Duration 8 weeks   PT Treatment/Interventions ADLs/Self Care Home Management;DME  Instruction;Gait training;Stair training;Functional mobility  training;Therapeutic activities;Therapeutic exercise;Balance  training;Neuromuscular re-education;Patient/family education;Manual  techniques;Electrical Stimulation;Moist Heat;Ultrasound;Dry  needling;Taping   PT Next Visit Plan needs KX every visit secondary to previous neuro PT;   low level core strengthening;  Nu-step;  psoas stretching in doorway;   bridge     Patient will benefit from skilled therapeutic intervention in order to improve the following deficits and impairments:   Abnormal gait, Decreased balance, Decreased activity tolerance, Decreased mobility, Decreased strength, Decreased range of motion, Difficulty walking, Pain, Impaired flexibility  Visit Diagnosis: Bilateral low back pain without sciatica  (primary encounter diagnosis)  Muscle weakness (generalized)  Other abnormalities of gait and mobility  Unsteadiness  Unspecified lack of coordination         Problem List Patient Active Problem List   Diagnosis Date Noted  . Pre-diabetes 11/01/2015  . TBI (traumatic brain injury) ([place]) 10/25/2015  . OSA on CPAP 10/25/2015  . Paraesophageal hiatal hernia 10/17/2015  . Chronic low back pain without sciatica 06/28/2015  . OSA (obstructive sleep apnea) 06/12/2015  . Excessive daytime sleepiness 04/11/2015  . Episodic cluster headache, not intractable 04/11/2015  . Obesity, morbid ([place]) 04/11/2015  . Snoring 04/11/2015  . Insomnia 12/27/2014  . Hypothyroidism 09/04/2014  . Cognitive and behavioral changes 07/17/2014  . Confusion 07/17/2014  . History of traumatic brain injury 07/10/2014  . Recurrent falls 06/29/2014  . Osteopenia 04/04/2014  . Esophageal varices in cirrhosis - trace 03/19/2014  . Anemia 02/01/2014  . Chronic kidney disease, unspecified ([place]) 02/01/2014  . Chronic back pain 02/01/2014  . Stricture and stenosis of esophagus 01/26/2014  . Fall at home 01/24/2014  . Near syncope 01/12/2014  . Loss of weight 01/12/2014  . Constipation 11/18/2013  . Compression fracture 09/21/2013  . Cirrhosis ([place]) 09/18/2013  . Atypical chest pain 07/26/2013  . Ascites 07/19/2013  . Burn 07/03/2013  . Dyspnea 02/03/2013  . Abnormal stress echo 01/19/2013  . Tobacco abuse 10/22/2012  . Allergic conjunctivitis 06/19/2012  . Skin lesion 06/19/2012  . Lumbar spondylosis 12/24/2011  . Obesity (BMI 35.0-39.9 without comorbidity) ([place]) 12/08/2011  . Right hip pain 12/01/2011  . Chronic hepatitis C - genotype 1A 10/06/2011  . GERD  (gastroesophageal reflux disease) 10/06/2011  .  Hepatic encephalopathy syndrome ([place]) 07/28/2011  . Back pain 04/07/2011  . 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 THROMBOCYTOPENIA 10/21/2006  . Depression, major, recurrent, moderate ([place]) 10/21/2006  . HYPERTENSION, BENIGN SYSTEMIC 10/21/2006  . Hepatic cirrhosis ([place]) 10/21/2006  . Generalized pruritus 10/21/2006   Ruben Im, PT 12/02/2015 7:59 PM Phone: [phone] Fax: [phone] NAUMANN-HOUEGNIFIO,Marlys Stegmaier PTA 11/27/2015, 1:23 PM  [facility] [address], [place], [place], [address] Phone: [phone]   Fax:  [phone]  Name: Wendy [name] MRN: [id number] Date of Birth: Jan 03, 1961

## 2015-11-28 ENCOUNTER — Encounter (HOSPITAL_COMMUNITY): Payer: Self-pay | Admitting: Psychiatry

## 2015-11-28 ENCOUNTER — Ambulatory Visit (INDEPENDENT_AMBULATORY_CARE_PROVIDER_SITE_OTHER): Payer: Medicare Other | Admitting: Psychiatry

## 2015-11-28 VITALS — BP 126/74 | HR 72 | Ht 62.0 in | Wt 209.0 lb

## 2015-11-28 DIAGNOSIS — M549 Dorsalgia, unspecified: Secondary | ICD-10-CM

## 2015-11-28 DIAGNOSIS — F0630 Mood disorder due to known physiological condition, unspecified: Secondary | ICD-10-CM | POA: Diagnosis not present

## 2015-11-28 DIAGNOSIS — F411 Generalized anxiety disorder: Secondary | ICD-10-CM | POA: Diagnosis not present

## 2015-11-28 DIAGNOSIS — F331 Major depressive disorder, recurrent, moderate: Secondary | ICD-10-CM

## 2015-11-28 DIAGNOSIS — G8929 Other chronic pain: Secondary | ICD-10-CM

## 2015-11-28 MED ORDER — QUETIAPINE FUMARATE 50 MG PO TABS: 50.0000 mg | ORAL_TABLET | Freq: Every day | ORAL | Status: DC

## 2015-11-28 MED ORDER — ESCITALOPRAM OXALATE 10 MG PO TABS: 10.0000 mg | ORAL_TABLET | Freq: Every day | ORAL | Status: DC

## 2015-11-28 NOTE — Progress Notes (Signed)
Patient ID: Wendy [name], female   DOB: Jul 14, 1961, 55 y.o.   MRN: [id number]  Wendy [name] Follow up visit  TUMEKA CHIMENTI [id number] 55 y.o.  11/28/2015 11:43 AM  Chief Complaint:  Depression, follow up  History of Present Illness:   Patient Presents for follow-up and medication management for panic disorder, generalized anxiety disorder. Major depressive disorder. 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  [name] is 55 years old currently single Caucasian female initially referred by Dr. Sheppard Coil for depression has multiple medical conditions including history of broken hip before 4 years ago that exacerbated her symptoms of depression. She has history of hepatitis C which is now cured and  has history of fall, TBI and multiple medical conditions  Mood wise she is not hopeless but she is limited medically and uses a walker. Feels tired and down due to limitations. Has a room mate who helps. We have cut down seroquel to 170m considering her lliver condition. Anxiety: relevant to stress, medical conditions and feeling lonely. Not worsened She has had some memory deficits and is followed by other primary and other providers.  Not worsened but worry about her health in general Aggravating factors; physical health, broken hip 4 years ago. 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  She feels lonely. Multiple medical issues. Her husband died 168years ago on Valentine's Day.  Modifying factors; she attends the [employer] she tries to go for a walk.  Severity of depression; 5  out of 10. 10 being no depression Duration; more than 10:15 years. Worse in the last 4 years Medical complexity; reviewed records. She does have hypothyroidism. History of hepatitis C. Hip surgeries. All these conditions can exacerbate her depression.  There is no associated symptoms of psychosis, paranoia. Does not endorse suicidal or  homicidal thoughts. There is no history of physical or sexual trauma.    Medical History; Past Medical History  Diagnosis Date  . Hepatic cirrhosis due to chronic hepatitis C infection ([place]     ETOH causative as well  . Hiatal hernia   . Esophageal stricture     esophageal dysmotility and chronic dysphagia as well  . 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 History of alcohol abuse   . Anxiety and depression   . History of hip fracture   . Allergic rhinitis   . HTN (hypertension)   . Blood transfusion   . Cataract     MILD  . GERD (gastroesophageal reflux disease)   . Osteoporosis   . Ulcer     2007  . Arthritis   . Clotting disorder ([place]     prolonged clotting time due to liver disease  . Paraesophageal hernia   . Compression fracture 09/21/2013  . Hepatic encephalopathy syndrome ([place]   . Paraesophageal hiatal hernia 10/17/2015    Allergies: Allergies  Allergen Reactions  . Codeine Phosphate Itching  . Codeine Rash    Medications: [name] Encounter Prescriptions as of 11/28/2015  Medication Sig  . alendronate (FOSAMAX) 70 MG tablet Take 70 mg by mouth.  . baclofen (LIORESAL) 10 MG tablet Take 0.5-1 tablets (5-10 mg total) by mouth 2 (two) times daily as needed for muscle spasms.  . bisacodyl ([employer] GENTLE LAXATIVE) 5 MG EC tablet TAKE 1 TABLET (5 MG TOTAL) BY MOUTH DAILY.  . carvedilol (COREG) 12.5 MG tablet Take 1 tablet (12.5 mg total) by mouth 2 (two)  times daily with a meal.  . CHANTIX 1 MG tablet TAKE 1 TABLET BY MOUTH TWICE A DAY  . Cholecalciferol (VITAMIN D3) 2000 UNITS capsule Take 2,000 Units by mouth daily.  . clonazePAM (KLONOPIN) 1 MG tablet Take 1 tablet (1 mg total) by mouth 2 (two) times daily as needed for anxiety.  . cycloSPORINE (RESTASIS) 0.05 % ophthalmic emulsion Place 1 drop into both eyes 2 (two) times daily as needed (For dry eyes.).  Marland Kitchen docusate sodium (COLACE) 100 MG capsule TAKE ONE CAPSULE BY MOUTH TWICE A DAY  .  escitalopram (LEXAPRO) 10 MG tablet Take 1 tablet (10 mg total) by mouth daily.  . furosemide (LASIX) 40 MG tablet TAKE 1/2 TABLET EVERY DAY  . gabapentin (NEURONTIN) 600 MG tablet Take 1 tablet (600 mg total) by mouth 3 (three) times daily.  . hydrOXYzine (ATARAX/VISTARIL) 25 MG tablet TAKE 1 TO 2 TABLETS BY MOUTH TWICE A DAY AS NEEDED FOR ITCHING  . lactulose (CHRONULAC) 10 GM/15ML solution Take 45 mLs (30 g total) by mouth 4 (four) times daily as needed (for constipation).  Marland Kitchen levothyroxine (SYNTHROID, LEVOTHROID) 75 MCG tablet TAKE 1 TABLET BY MOUTH EVERY DAY BEFORE BREAKFAST  . loratadine (CLARITIN) 10 MG tablet Take 1 tablet (10 mg total) by mouth daily.  . Multiple Vitamins-Minerals (CENTRUM SILVER ULTRA WOMENS PO) Take by mouth.  . Olopatadine HCl 0.2 % SOLN Place 1 drop into both eyes every morning.   Marland Kitchen omeprazole (PRILOSEC) 20 MG capsule Take 1 capsule (20 mg total) by mouth daily.  . polyethylene glycol (MIRALAX) packet Take 17 g by mouth 2 (two) times daily.  . QUEtiapine (SEROQUEL) 50 MG tablet Take 1 tablet (50 mg total) by mouth at bedtime.  . rifaximin (XIFAXAN) 550 MG TABS tablet Take 1 tablet (550 mg total) by mouth 2 (two) times daily.  Marland Kitchen spironolactone (ALDACTONE) 100 MG tablet TAKE 1 TABLET EVERY DAY  . traMADol (ULTRAM) 50 MG tablet Take 1-2 tablets (50-100 mg total) by mouth every 8 (eight) hours as needed (For pain.).  Marland Kitchen traZODone (DESYREL) 50 MG tablet TAKE 1/2-1 TABLET BY MOUTH AT BEDTIME AS NEEDED FOR SLEEP  . [DISCONTINUED] escitalopram (LEXAPRO) 10 MG tablet Take 1 tablet (10 mg total) by mouth daily.  . [DISCONTINUED] QUEtiapine (SEROQUEL) 100 MG tablet Take 1 tablet (100 mg total) by mouth at bedtime.   No facility-administered encounter medications on file as of 11/28/2015.    Family History; Family History  Problem Relation Age of Onset  . Heart disease Mother   . Anxiety disorder Mother   . Colon cancer Neg Hx   . Other Daughter     chromosome abnormalit   . Other Daughter     micropthalmia       Labs:  Recent Results (from the past 2160 hour(s))  CBC with Differential/Platelet     Status: Abnormal   Collection Time: 10/17/15 10:09 AM  Result Value Ref Range   WBC 6.0 4.0 - 10.5 K/uL   RBC 3.20 (L) 3.87 - 5.11 Mil/uL   Hemoglobin 12.1 12.0 - 15.0 g/dL   HCT 36.3 36.0 - 46.0 %   MCV 113.7 Rechecked and verified result. (H) 78.0 - 100.0 fl   MCHC 33.3 30.0 - 36.0 g/dL   RDW 15.9 (H) 11.5 - 15.5 %   Platelets 187.0 150.0 - 400.0 K/uL   Neutrophils Relative % 49.5 43.0 - 77.0 %   Lymphocytes Relative 29.8 12.0 - 46.0 %   Monocytes Relative 18.3 Repeated  and verified X2. (H) 3.0 - 12.0 %   Eosinophils Relative 1.9 0.0 - 5.0 %   Basophils Relative 0.5 0.0 - 3.0 %   Neutro Abs 3.0 1.4 - 7.7 K/uL   Lymphs Abs 1.8 0.7 - 4.0 K/uL   Monocytes Absolute 1.1 (H) 0.1 - 1.0 K/uL   Eosinophils Absolute 0.1 0.0 - 0.7 K/uL   Basophils Absolute 0.0 0.0 - 0.1 K/uL  Comprehensive metabolic panel     Status: Abnormal   Collection Time: 10/17/15 10:09 AM  Result Value Ref Range   Sodium 136 135 - 145 mEq/L   Potassium 4.1 3.5 - 5.1 mEq/L   Chloride 104 96 - 112 mEq/L   CO2 28 19 - 32 mEq/L   Glucose, Bld 265 (H) 70 - 99 mg/dL   BUN 16 6 - 23 mg/dL   Creatinine, Ser 0.84 0.40 - 1.20 mg/dL   Total Bilirubin 1.1 0.2 - 1.2 mg/dL   Alkaline Phosphatase 124 (H) 39 - 117 U/L   AST 28 0 - 37 U/L   ALT 15 0 - 35 U/L   Total Protein 7.0 6.0 - 8.3 g/dL   Albumin 2.6 (L) 3.5 - 5.2 g/dL   Calcium 8.7 8.4 - 10.5 mg/dL   GFR 74.60 >60.00 mL/min  Protime-INR     Status: Abnormal   Collection Time: 10/17/15 10:09 AM  Result Value Ref Range   INR 1.5 (H) 0.8 - 1.0 ratio   Prothrombin Time 15.8 (H) 9.6 - 13.1 sec  AFP tumor marker     Status: Abnormal   Collection Time: 10/17/15 10:09 AM  Result Value Ref Range   AFP-Tumor Marker 8.1 (H) <6.1 ng/mL    Comment: Patients < 46 month old: * Pediatric range is based on full term neonates, values for  premature infants may be higher.   Female: The use of AFP as a Tumor Marker in pregnant patients is not recommended.   This test was performed using the Beckman Coulter chemiluminescent method. Values obtained from different assay methods cannot be used interchangeably. AFP levels, regardless of value, should not be interpreted as absolute evidence of the presence or absence of disease.   Hepatitis C RNA quantitative     Status: None   Collection Time: 10/17/15 10:09 AM  Result Value Ref Range   HCV Quantitative Not Detected <15 IU/mL    Comment: No detectable level of HCV RNA.      HCV Quantitative Log NOT CALC <1.18 log 10    Comment:   This test utilizes the [place] FDA approved Roche HCV Test Kit by RT-PCR.   HgB A1c     Status: None   Collection Time: 11/01/15  1:45 PM  Result Value Ref Range   Hemoglobin A1C 5.8        Musculoskeletal: Strength & Muscle Tone: within normal limits Gait & Station: broad based Patient leans: front when standing  Mental Status Examination;   Psychiatric Specialty Exam: Physical Exam  HENT:  Head: Normocephalic.  Skin: She is not diaphoretic.    Review of Systems  Constitutional: Negative for fever.  Respiratory: Negative for cough.   Cardiovascular: Negative for chest pain.  Musculoskeletal: Positive for back pain.  Skin: Negative for rash.  Neurological: Negative for tremors and headaches.  Psychiatric/Behavioral: Positive for depression. Negative for suicidal ideas and substance abuse. The patient is nervous/anxious.     Blood pressure 126/74, pulse 72, height '5\' 2"'  (1.575 m), weight 209 lb (94.802 kg), SpO2 99 %.  Body mass index is 38.22 kg/(m^2).  General Appearance: Casual  Eye Contact::  Fair  Speech:  Slow  Volume:  Decreased  Mood: episodically dysphoric  Affect:  Congruent  Thought Process:  Coherent  Orientation:  Full (Time, Place, and Person)  Thought Content:  Rumination  Suicidal Thoughts:  No  Homicidal  Thoughts:  No  Memory:  Immediate;   Fair Recent;   Fair  Judgement:  Fair  Insight:  Shallow  Psychomotor Activity:  Decreased  Concentration:  Fair  Recall:  Fair  Akathisia:  Negative  Handed:  Right  AIMS (if indicated):     Assets:  Desire for Improvement Vocational/Educational  Sleep:        Assessment: Axis I: Maj. depressive disorder recurrent moderate. Generalized anxiety disorder. Panic disorder with agoraphobia. 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 Hepatic cirrhosis due to chronic hepatitis C infection ([name])     ETOH causative as well  . Hiatal hernia   . Esophageal stricture     esophageal dysmotility and chronic dysphagia as well  . 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 History of alcohol abuse   . Anxiety and depression   . History of hip fracture   . Allergic rhinitis   . HTN (hypertension)   . Blood transfusion   . Cataract     MILD  . GERD (gastroesophageal reflux disease)   . Osteoporosis   . Ulcer     2007  . Arthritis   . Clotting disorder ([place])     prolonged clotting time due to liver disease  . Paraesophageal hernia   . Compression fracture 09/21/2013  . Hepatic encephalopathy syndrome ([place])   . Paraesophageal hiatal hernia 10/17/2015    Axis IV: Psychosocial. Multiple medical. Loneliness  Treatment Plan and Summary:  Depression: continue lexapro to 63m a day   Panic and Anxiety" lexapro as above.. Will cut down seroquel further to 578mconsidering her medical complexity. Also recommend to cut down the Klonopin considering her cognitive concerns. She understands and will continue close follow up with primary care.  Medical complexity: Follow closely with other providers in regarding to her medical condition including hypothyroidism .  Pertinent Labs and Relevant Prior Notes  reviewed. Medication Side effects, benefits and risks reviewed/discussed with Patient. Time given for patient to respond and asks questions regarding the Diagnosis and Medications. Safety concerns and to report to ER if suicidal or call 911. Relevant Medications refilled or called in to pharmacy. Discussed weight maintenance and Sleep Hygiene. Follow up with Primary care provider in regards to Medical conditions. Recommend compliance with medications and follow up office appointments. Discussed to avail opportunity to consider or/and continue Individual therapy with Counselor. Greater than 50% of time was spend in counseling and coordination of care with the patient.  Schedule for Follow up visit in 8  weeks or call in earlier as necessary. Time spent: 25 minutes  AKMerian CapronMD 11/28/2015

## 2015-11-29 ENCOUNTER — Other Ambulatory Visit: Payer: Self-pay | Admitting: Family Medicine

## 2015-11-29 DIAGNOSIS — G4700 Insomnia, unspecified: Secondary | ICD-10-CM

## 2015-12-01 ENCOUNTER — Other Ambulatory Visit: Payer: Self-pay | Admitting: Family Medicine

## 2015-12-01 DIAGNOSIS — K219 Gastro-esophageal reflux disease without esophagitis: Secondary | ICD-10-CM

## 2015-12-02 ENCOUNTER — Encounter: Payer: Self-pay | Admitting: Physical Therapy

## 2015-12-02 ENCOUNTER — Ambulatory Visit: Payer: Medicare Other | Admitting: Physical Therapy

## 2015-12-02 ENCOUNTER — Telehealth: Payer: Self-pay | Admitting: Neurology

## 2015-12-02 ENCOUNTER — Telehealth: Payer: Self-pay | Admitting: Family Medicine

## 2015-12-02 DIAGNOSIS — M6281 Muscle weakness (generalized): Secondary | ICD-10-CM | POA: Diagnosis not present

## 2015-12-02 DIAGNOSIS — R262 Difficulty in walking, not elsewhere classified: Secondary | ICD-10-CM

## 2015-12-02 DIAGNOSIS — R279 Unspecified lack of coordination: Secondary | ICD-10-CM | POA: Diagnosis not present

## 2015-12-02 DIAGNOSIS — R2689 Other abnormalities of gait and mobility: Secondary | ICD-10-CM | POA: Diagnosis not present

## 2015-12-02 DIAGNOSIS — R2681 Unsteadiness on feet: Secondary | ICD-10-CM | POA: Diagnosis not present

## 2015-12-02 DIAGNOSIS — M545 Low back pain: Secondary | ICD-10-CM | POA: Diagnosis not present

## 2015-12-02 NOTE — Telephone Encounter (Signed)
I am not treating her pain so I would address that referral to her PCP or PM&R.   Her problems with balance and falls all stem from her injury in 2006

## 2015-12-02 NOTE — Telephone Encounter (Signed)
Called and spoke to patient. States she saw an add on T.V. For a non surgical lazer therapy for pain. Pt gave me a number of [phone]. That is to the office of Jola Baptist, [place]. Pt would like to know if this is an option for her? Can she be referred? Pt would also like to know what you dx her with, informed pt it was a TBI, and she asked how that is related to her falls. Pt states she just wants to get better and needs to know how to do so. Please advise.     [email or web address])

## 2015-12-02 NOTE — Telephone Encounter (Signed)
Pt is calling because she seen on TV that there is a new way to help with pain. It is a non-surgical laser procedure. Please check into this so that we can see if this an option. jw

## 2015-12-02 NOTE — Addendum Note (Signed)
Addended by: Ruben Im C on: 12/02/2015 08:00 PM   Modules accepted: Orders

## 2015-12-02 NOTE — Telephone Encounter (Signed)
Pt needs to talk to someone about her pain please call [phone]

## 2015-12-02 NOTE — Therapy (Signed)
[facility] [address], [place], [place], [address] Phone: [phone]   Fax:  [phone]  Physical Therapy Treatment  Patient Details  Name: Wendy [name] MRN: [id number] Date of Birth: April 26, 1961 Referring Provider: Dr. Parks Ranger  Encounter Date: 12/02/2015      PT End of Session - 12/02/15 1251    Visit Number 5   Number of Visits 10   Date for PT Re-Evaluation 01/21/16   Authorization Type G codes  (needs KX previous PT  at neuro)   PT Start Time 1229   PT Stop Time 1314   PT Time Calculation (min) 45 min   Equipment Utilized During Treatment Gait belt   Behavior During Therapy [facility] for tasks assessed/performed      Past Medical History  Diagnosis Date  . Hepatic cirrhosis due to chronic hepatitis C infection ([place])     ETOH causative as well  . Hiatal hernia   . Esophageal stricture     esophageal dysmotility and chronic dysphagia as well  . 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 History of alcohol abuse   . Anxiety and depression   . History of hip fracture   . Allergic rhinitis   . HTN (hypertension)   . Blood transfusion   . Cataract     MILD  . GERD (gastroesophageal reflux disease)   . Osteoporosis   . Ulcer     2007  . Arthritis   . Clotting disorder ([place])     prolonged clotting time due to liver disease  . Paraesophageal hernia   . Compression fracture 09/21/2013  . Hepatic encephalopathy syndrome ([place])   . Paraesophageal hiatal hernia 10/17/2015    Past Surgical History  Procedure Laterality Date  . Orif hip fracture  2010    right x2  . Upper gastrointestinal endoscopy  08/05/2007    esophageal ring, hiatal hernia, portal gastropathy  . Splenectomy      age 66  . Burr hole for subdural hematoma  2004  . Upper gastrointestinal endoscopy  10/14/2011  . Colonoscopy  08/2012    moderatel left colon tics  . Kyphoplasty Bilateral 09/21/2013     Procedure: T12 - L1 KYPHOPLASTY;  Surgeon: Melina Schools, MD;  Location: [place];  Service: Orthopedics;  Laterality: Bilateral;  . Esophagogastroduodenoscopy (egd) with propofol N/A 01/26/2014    Procedure: ESOPHAGOGASTRODUODENOSCOPY (EGD) WITH PROPOFOL;  Surgeon: Lafayette Dragon, MD;  Location: [facility] ENDOSCOPY;  Service: Endoscopy;  Laterality: N/A;    There were no vitals filed for this visit.      Subjective Assessment - 12/02/15 1246    Subjective Pt reports a friend noticed she is walking better   Pertinent History frequent falls;  (fell last Saturday after getting out of bed, began shaking, "I froze up";  extensive medical history with co-morbidities;  patient reports decreased memory;  TBI    Limitations House hold activities;Walking;Standing;Sitting   How long can you sit comfortably? 5 min   How long can you stand comfortably? 5 min   How long can you walk comfortably? 50 yards   Diagnostic tests x-rays;  Unsure what other diagnostics where done   Patient Stated Goals back pain to go away so I can move on   Currently in Pain? Yes   Pain Score 4    Pain Location Back   Pain Orientation Right;Left   Pain Descriptors / Indicators Aching;Sore   Pain Type Chronic pain  Pain Radiating Towards up into low back   Pain Onset More than a month ago   Pain Frequency Intermittent   Aggravating Factors  standing, unsure about walking, getting up after sitting   Pain Relieving Factors sitting down is helping, but prolonged sitting makes it worse   Multiple Pain Sites No                         [facility] Adult PT Treatment/Exercise - 12/02/15 0001    Ambulation/Gait   Ambulation/Gait Yes   Ambulation/Gait Assistance 6: Modified independent (Device/Increase time)   Ambulation Distance (Feet) 80 Feet   Assistive device Rollator   Gait Pattern Step-through pattern;Decreased stride length;Trunk flexed   Ambulation Surface Indoor   Gait velocity 41sec   Exercises   Exercises  Lumbar;Knee/Hip   Lumbar Exercises: Standing   Other Standing Lumbar Exercises Rockerboard at sink x 47mn   Lumbar Exercises: Supine   Ab Set 5 reps  with yellow plyoball, & diagonal 2 x 5 each side   Bridge 10 reps  with block b/w knees   Other Supine Lumbar Exercises TA activation basic with 5 sec hold x 10 reps   Knee/Hip Exercises: Standing   Other Standing Knee Exercises psoas doorway stretch 10x each side  immproved technique   Knee/Hip Exercises: Seated   Sit to Sand --  Rockerboard in sitting x 3 min                  PT Short Term Goals - 12/02/15 1254    PT SHORT TERM GOAL #1   Title The patient will be able to perform initial, basic HEP for hip and core strengthening needed for turning, scooting in bed  12/10/15   Baseline -----   Time 4   Period Weeks   Status On-going   PT SHORT TERM GOAL #2   Title The patient will report a 25% improvement in pain with rising from sit to stand   Baseline ----   Time 4   Period Weeks   Status On-going   PT SHORT TERM GOAL #3   Title The patient will have improved hip, trunk ROM needed to put on socks and shoes with min assist from roomate   Baseline ---   Time 4   Period Weeks   Status On-going           PT Long Term Goals - 12/02/15 1258    PT LONG TERM GOAL #1   Title The patient will be independent in HEP for core/hip strengthening and flexibility for back pain management  01/07/16   Time 8   Period Weeks   Status On-going   PT LONG TERM GOAL #2   Title The patient will report a 50% improvement in pain with ADLs including getting out of bed and out of the chair   Baseline ----   Time 8   Period Weeks   Status On-going   PT LONG TERM GOAL #3   Title The patient will have core/trunk strength grossly 4/5 needed for standing/walking for 10 minutes with min c/o pain   Baseline ------   Time 8   Period Weeks   Status On-going   PT LONG TERM GOAL #4   Title Timed up and go gait speed improved from 29.3 to  16 sec for improved gait safety at home and in the community   Baseline ---   Time 8   Period Weeks  Status On-going   PT LONG TERM GOAL #5   Title FOTO functional outcome score improved from 71% limitation to 50% indicating improved function with less pain   Baseline MET Berg Balance 46 /56   Time 8   Period Weeks   Status On-going   PT LONG TERM GOAL #6   Title -----   Baseline ----               Plan - 12/02/15 1253    Clinical Impression Statement high risk of falls. Tight hamstrings and iliopsoas bil, weak abdominals. Pt will continue to benefit from skilled PT   Rehab Potential Good   Clinical Impairments Affecting Rehab Potential High risk of falls;  TBI;  numerous co-morbidities right hip fx;  memory loss   PT Frequency 2x / week   PT Duration 8 weeks   PT Treatment/Interventions ADLs/Self Care Home Management;DME Instruction;Gait training;Stair training;Functional mobility training;Therapeutic activities;Therapeutic exercise;Balance training;Neuromuscular re-education;Patient/family education;Manual techniques;Electrical Stimulation;Moist Heat;Ultrasound;Dry needling;Taping   PT Next Visit Plan needs KX every visit secondary to previous neuro PT;  low level core strengthening;  Nu-step;  psoas stretching in doorway;  bridge   PT Home Exercise Plan continue with abdominal strengthening   Consulted and Agree with Plan of Care Patient      Patient will benefit from skilled therapeutic intervention in order to improve the following deficits and impairments:  Abnormal gait, Decreased balance, Decreased activity tolerance, Decreased mobility, Decreased strength, Decreased range of motion, Difficulty walking, Pain, Impaired flexibility  Visit Diagnosis: Muscle weakness (generalized)  Difficulty in walking, not elsewhere classified     Problem List Patient Active Problem List   Diagnosis Date Noted  . Pre-diabetes 11/01/2015  . TBI (traumatic brain injury) ([place])  10/25/2015  . OSA on CPAP 10/25/2015  . Paraesophageal hiatal hernia 10/17/2015  . Chronic low back pain without sciatica 06/28/2015  . OSA (obstructive sleep apnea) 06/12/2015  . Excessive daytime sleepiness 04/11/2015  . Episodic cluster headache, not intractable 04/11/2015  . Obesity, morbid ([place]) 04/11/2015  . Snoring 04/11/2015  . Insomnia 12/27/2014  . Hypothyroidism 09/04/2014  . Cognitive and behavioral changes 07/17/2014  . Confusion 07/17/2014  . History of traumatic brain injury 07/10/2014  . Recurrent falls 06/29/2014  . Osteopenia 04/04/2014  . Esophageal varices in cirrhosis - trace 03/19/2014  . Anemia 02/01/2014  . Chronic kidney disease, unspecified ([place]) 02/01/2014  . Chronic back pain 02/01/2014  . Stricture and stenosis of esophagus 01/26/2014  . Fall at home 01/24/2014  . Near syncope 01/12/2014  . Loss of weight 01/12/2014  . Constipation 11/18/2013  . Compression fracture 09/21/2013  . Cirrhosis ([place]) 09/18/2013  . Atypical chest pain 07/26/2013  . Ascites 07/19/2013  . Burn 07/03/2013  . Dyspnea 02/03/2013  . Abnormal stress echo 01/19/2013  . Tobacco abuse 10/22/2012  . Allergic conjunctivitis 06/19/2012  . Skin lesion 06/19/2012  . Lumbar spondylosis 12/24/2011  . Obesity (BMI 35.0-39.9 without comorbidity) ([place]) 12/08/2011  . Right hip pain 12/01/2011  . Chronic hepatitis C - genotype 1A 10/06/2011  . GERD (gastroesophageal reflux disease) 10/06/2011  . Hepatic encephalopathy syndrome ([place]) 07/28/2011  . Back pain 04/07/2011  . 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 THROMBOCYTOPENIA 10/21/2006  . Depression, major, recurrent, moderate ([place]) 10/21/2006  . HYPERTENSION, BENIGN SYSTEMIC 10/21/2006  . Hepatic cirrhosis ([place]) 10/21/2006  . Generalized pruritus 10/21/2006    NAUMANN-HOUEGNIFIO,Avea Mcgowen PTA 12/02/2015, 5:21 PM  [facility] [address], [place], [place],  [address] Phone: [phone]   Fax:  [phone]  Name: Wendy [name] MRN: [id number] Date of Birth: 1961-05-31

## 2015-12-03 NOTE — Telephone Encounter (Signed)
Patient also called her Neurologist office Dr Tomi Likens on 12/02/15 with same question, see their telephone note for additional details.  She is calling in reference to an add for a local chiropractor Jola Baptist [phone], located at [place]. He offers a laser therapy on the skin for treatment of acute and chronic pains of various types. This is an in office procedure. She is asking if this is a good option for her and if she can be referred.  I briefly looked into this treatment, and it is an alternative treatment for pain. This is not necessarily a replacement treatment, but it is a potential option. There is not clear evidence that it will help with chronic pain. There do not seem to be significant side effects from low dose topical laser treatment, but it is also not covered by insurance and she does not need referral, I am unsure of the cost.  I attempted to call Ellah but did not reach her, and left a voicemail, stating that she does not need a referral, she may call and follow-up with the chiropractor if she decides to, otherwise she may call us back and I will leave a message with nursing staff with the following info:  If she calls back, please share the following with her:  1. Laser treatment is an alternative therapy, I do not think it will significantly improve her chronic back pain, but I do not think it would do any harm either 2. She may talk with her Physical Medicine & Rehab doctor - Dr Letta Pate for more information on this treatment (may be more familiar with it) 3. Laser treatment by chiropractor is not covered by insurance. Therefore, she does not need a referral. I do not know how much this would cost. 4. If she wants to learn more about it, she could contact Jola Baptist chiropractor office at above phone #  [name], [place], PGY-3

## 2015-12-03 NOTE — Telephone Encounter (Signed)
Message relayed to patient. Verbalized understanding and denied questions.   

## 2015-12-04 ENCOUNTER — Ambulatory Visit: Payer: Medicare Other | Admitting: Physical Therapy

## 2015-12-05 NOTE — Telephone Encounter (Signed)
Patient informed, expressed understanding. 

## 2015-12-09 ENCOUNTER — Ambulatory Visit: Payer: Medicare Other | Admitting: Physical Therapy

## 2015-12-09 ENCOUNTER — Encounter: Payer: Self-pay | Admitting: Physical Therapy

## 2015-12-09 DIAGNOSIS — R262 Difficulty in walking, not elsewhere classified: Secondary | ICD-10-CM

## 2015-12-09 DIAGNOSIS — M6281 Muscle weakness (generalized): Secondary | ICD-10-CM

## 2015-12-09 NOTE — Therapy (Unsigned)
[facility] [address] [place], [place], [address] Phone: [phone]   Fax:  [phone]  Physical Therapy Treatment  Patient Details  Name: Wendy [name] MRN: [id number] Date of Birth: 1961/03/14 Referring Provider: Dr. Parks Ranger  Encounter Date: 12/09/2015      PT End of Session - 12/09/15 1244    Visit Number 6   Number of Visits 10   Date for PT Re-Evaluation 01-10-16   Authorization Type G codes  (needs KX previous PT  at neuro)   PT Start Time 1228   PT Stop Time 1313   PT Time Calculation (min) 45 min   Equipment Utilized During Treatment Gait belt   Activity Tolerance Patient tolerated treatment well   Behavior During Therapy [facility] for tasks assessed/performed      Past Medical History  Diagnosis Date  . Hepatic cirrhosis due to chronic hepatitis C infection ([place])     ETOH causative as well  . Hiatal hernia   . Esophageal stricture     esophageal dysmotility and chronic dysphagia as well  . 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 History of alcohol abuse   . Anxiety and depression   . History of hip fracture   . Allergic rhinitis   . HTN (hypertension)   . Blood transfusion   . Cataract     MILD  . GERD (gastroesophageal reflux disease)   . Osteoporosis   . Ulcer     2007  . Arthritis   . Clotting disorder ([place])     prolonged clotting time due to liver disease  . Paraesophageal hernia   . Compression fracture 09/21/2013  . Hepatic encephalopathy syndrome ([place])   . Paraesophageal hiatal hernia 10/17/2015    Past Surgical History  Procedure Laterality Date  . Orif hip fracture  2010    right x2  . Upper gastrointestinal endoscopy  08/05/2007    esophageal ring, hiatal hernia, portal gastropathy  . Splenectomy      age 55  . Burr hole for subdural hematoma  2004  . Upper gastrointestinal endoscopy  10/14/2011  . Colonoscopy  08/2012    moderatel left colon  tics  . Kyphoplasty Bilateral 09/21/2013    Procedure: T12 - L1 KYPHOPLASTY;  Surgeon: Melina Schools, MD;  Location: [place];  Service: Orthopedics;  Laterality: Bilateral;  . Esophagogastroduodenoscopy (egd) with propofol N/A 01/26/2014    Procedure: ESOPHAGOGASTRODUODENOSCOPY (EGD) WITH PROPOFOL;  Surgeon: Lafayette Dragon, MD;  Location[facility];  Service: Endoscopy;  Laterality: N/A;    There were no vitals filed for this visit.      Subjective Assessment - 12/09/15 1240    Subjective Pt reports feeling weak and she reports had a few stumbles the last few day's, but she was able to regain her balqnce byherself.    Pertinent History frequent falls;  (fell last Saturday after getting out of bed, began shaking, "I froze up";  extensive medical history with co-morbidities;  patient reports decreased memory;  TBI    Limitations House hold activities;Walking;Standing;Sitting   How long can you sit comfortably? 5 min   How long can you stand comfortably? 5 min   How long can you walk comfortably? 50 yards   Diagnostic tests x-rays;  Unsure what other diagnostics where done   Patient Stated Goals back pain to go away so I can move on   Currently in Pain? Yes   Pain Score 6  Pain Location Back   Pain Orientation Right;Left   Pain Descriptors / Indicators Aching;Sore   Pain Type Chronic pain   Pain Onset More than a month ago   Pain Frequency Intermittent   Aggravating Factors  standing, unsure about walking, getting up after sitting   Pain Relieving Factors sitting down is helping, but prolonged sitting makes it worse.    Multiple Pain Sites No                         [facility] Adult PT Treatment/Exercise - 12/09/15 0001    Ambulation/Gait   Ambulation/Gait Yes   Ambulation/Gait Assistance 6: Modified independent (Device/Increase time)   Ambulation Distance (Feet) 80 Feet   Assistive device Rollator   Gait Pattern Step-through pattern;Decreased stride length;Trunk flexed    Ambulation Surface Indoor   Gait velocity 39sec   Exercises   Exercises Lumbar;Knee/Hip   Lumbar Exercises: Standing   Other Standing Lumbar Exercises Rockerboard at sink x 69mn   Other Standing Lumbar Exercises Standing on Blue balance pillow -3 way raises 1# x 10, pt able to perform with Supervision for saftey.    Lumbar Exercises: Seated   Other Seated Lumbar Exercises sit to stand without UE support x 5  needs vc's for pacing   Lumbar Exercises: Supine   Ab Set 10 reps  with yellow plyoball diagonal   Bridge 20 reps;3 seconds   Other Supine Lumbar Exercises --   Knee/Hip Exercises: Aerobic   Nustep Level 1 Seat 7, arms 9, 8 min 0.298m   Knee/Hip Exercises: Seated   Sit to Sand --  Rockerboard in sitting                  PT Short Term Goals - 12/09/15 1250    PT SHORT TERM GOAL #1   Title The patient will be able to perform initial, basic HEP for hip and core strengthening needed for turning, scooting in bed  12/10/15   Time 4   Period Weeks   Status On-going   PT SHORT TERM GOAL #2   Title The patient will report a 25% improvement in pain with rising from sit to stand   Time 4   Period Weeks   Status On-going   PT SHORT TERM GOAL #3   Title The patient will have improved hip, trunk ROM needed to put on socks and shoes with min assist from roomate   Time 4   Period Weeks   Status On-going           PT Long Term Goals - 12/02/15 1258    PT LONG TERM GOAL #1   Title The patient will be independent in HEP for core/hip strengthening and flexibility for back pain management  01/07/16   Time 8   Period Weeks   Status On-going   PT LONG TERM GOAL #2   Title The patient will report a 50% improvement in pain with ADLs including getting out of bed and out of the chair   Baseline ----   Time 8   Period Weeks   Status On-going   PT LONG TERM GOAL #3   Title The patient will have core/trunk strength grossly 4/5 needed for standing/walking for 10 minutes  with min c/o pain   Baseline ------   Time 8   Period Weeks   Status On-going   PT LONG TERM GOAL #4   Title Timed up and go gait speed improved  from 29.3 to 16 sec for improved gait safety at home and in the community   Baseline ---   Time 8   Period Weeks   Status On-going   PT LONG TERM GOAL #5   Title FOTO functional outcome score improved from 71% limitation to 50% indicating improved function with less pain   Baseline MET Berg Balance 46 /56   Time 8   Period Weeks   Status On-going   PT LONG TERM GOAL #6   Title -----   Baseline ----               Plan - 12/09/15 1246    Clinical Impression Statement High risk of falls, tight hamstrings and iliopsoas, weak abdominals.    Rehab Potential Good   Clinical Impairments Affecting Rehab Potential High risk of falls;  TBI;  numerous co-morbidities right hip fx;  memory loss   PT Frequency 2x / week   PT Duration 8 weeks   PT Treatment/Interventions ADLs/Self Care Home Management;DME Instruction;Gait training;Stair training;Functional mobility training;Therapeutic activities;Therapeutic exercise;Balance training;Neuromuscular re-education;Patient/family education;Manual techniques;Electrical Stimulation;Moist Heat;Ultrasound;Dry needling;Taping   PT Next Visit Plan needs KX every visit secondary to previous neuro PT;  low level core strengthening;  Nu-step;  psoas stretching in doorway;  bridge   PT Home Exercise Plan continue with abdominal strengthening   Consulted and Agree with Plan of Care Patient      Patient will benefit from skilled therapeutic intervention in order to improve the following deficits and impairments:  Abnormal gait, Decreased balance, Decreased activity tolerance, Decreased mobility, Decreased strength, Decreased range of motion, Difficulty walking, Pain, Impaired flexibility  Visit Diagnosis: Muscle weakness (generalized)  Difficulty in walking, not elsewhere classified     Problem  List Patient Active Problem List   Diagnosis Date Noted  . Pre-diabetes 11/01/2015  . TBI (traumatic brain injury) ([place]) 10/25/2015  . OSA on CPAP 10/25/2015  . Paraesophageal hiatal hernia 10/17/2015  . Chronic low back pain without sciatica 06/28/2015  . OSA (obstructive sleep apnea) 06/12/2015  . Excessive daytime sleepiness 04/11/2015  . Episodic cluster headache, not intractable 04/11/2015  . Obesity, morbid ([place]) 04/11/2015  . Snoring 04/11/2015  . Insomnia 12/27/2014  . Hypothyroidism 09/04/2014  . Cognitive and behavioral changes 07/17/2014  . Confusion 07/17/2014  . History of traumatic brain injury 07/10/2014  . Recurrent falls 06/29/2014  . Osteopenia 04/04/2014  . Esophageal varices in cirrhosis - trace 03/19/2014  . Anemia 02/01/2014  . Chronic kidney disease, unspecified ([place]) 02/01/2014  . Chronic back pain 02/01/2014  . Stricture and stenosis of esophagus 01/26/2014  . Fall at home 01/24/2014  . Near syncope 01/12/2014  . Loss of weight 01/12/2014  . Constipation 11/18/2013  . Compression fracture 09/21/2013  . Cirrhosis ([place]) 09/18/2013  . Atypical chest pain 07/26/2013  . Ascites 07/19/2013  . Burn 07/03/2013  . Dyspnea 02/03/2013  . Abnormal stress echo 01/19/2013  . Tobacco abuse 10/22/2012  . Allergic conjunctivitis 06/19/2012  . Skin lesion 06/19/2012  . Lumbar spondylosis 12/24/2011  . Obesity (BMI 35.0-39.9 without comorbidity) ([place]) 12/08/2011  . Right hip pain 12/01/2011  . Chronic hepatitis C - genotype 1A 10/06/2011  . GERD (gastroesophageal reflux disease) 10/06/2011  . Hepatic encephalopathy syndrome ([place]) 07/28/2011  . Back pain 04/07/2011  . 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 THROMBOCYTOPENIA 10/21/2006  . Depression, major, recurrent, moderate ([place]) 10/21/2006  . HYPERTENSION, BENIGN SYSTEMIC 10/21/2006  . Hepatic cirrhosis ([place]) 10/21/2006  . Generalized pruritus 10/21/2006  Buddy Loeffelholz Naumann-Houegnifio, PTA 12/09/2015  1:11 PM  [facility] [address] [place], [place], [address] Phone: [phone]   Fax:  [phone]  Name: ESTHER BRADSTREET MRN: [id number] Date of Birth: Nov 17, 1960

## 2015-12-11 ENCOUNTER — Encounter: Payer: Self-pay | Admitting: Physical Therapy

## 2015-12-11 ENCOUNTER — Ambulatory Visit: Payer: Medicare Other | Admitting: Physical Therapy

## 2015-12-11 DIAGNOSIS — R2681 Unsteadiness on feet: Secondary | ICD-10-CM | POA: Diagnosis not present

## 2015-12-11 DIAGNOSIS — R2689 Other abnormalities of gait and mobility: Secondary | ICD-10-CM | POA: Diagnosis not present

## 2015-12-11 DIAGNOSIS — M545 Low back pain: Secondary | ICD-10-CM | POA: Diagnosis not present

## 2015-12-11 DIAGNOSIS — R262 Difficulty in walking, not elsewhere classified: Secondary | ICD-10-CM | POA: Diagnosis not present

## 2015-12-11 DIAGNOSIS — R279 Unspecified lack of coordination: Secondary | ICD-10-CM | POA: Diagnosis not present

## 2015-12-11 DIAGNOSIS — M6281 Muscle weakness (generalized): Secondary | ICD-10-CM

## 2015-12-11 NOTE — Therapy (Signed)
[facility] [address], [place], [place], [address] Phone: [phone]   Fax:  [phone]  Physical Therapy Treatment  Patient Details  Name: Wendy [name] MRN: [id number] Date of Birth: 1961/08/16 Referring Provider: Dr. Parks Ranger  Encounter Date: 12/11/2015      PT End of Session - 12/11/15 1235    Visit Number 7   Number of Visits 10   Date for PT Re-Evaluation 01/13/2016   Authorization Type G codes  (needs KX previous PT  at neuro)   PT Start Time 1226   PT Stop Time 1312   PT Time Calculation (min) 46 min   Equipment Utilized During Treatment Gait belt   Activity Tolerance Patient tolerated treatment well   Behavior During Therapy [facility] for tasks assessed/performed      Past Medical History  Diagnosis Date  . Hepatic cirrhosis due to chronic hepatitis C infection ([place])     ETOH causative as well  . Hiatal hernia   . Esophageal stricture     esophageal dysmotility and chronic dysphagia as well  . 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 History of alcohol abuse   . Anxiety and depression   . History of hip fracture   . Allergic rhinitis   . HTN (hypertension)   . Blood transfusion   . Cataract     MILD  . GERD (gastroesophageal reflux disease)   . Osteoporosis   . Ulcer     2007  . Arthritis   . Clotting disorder ([place])     prolonged clotting time due to liver disease  . Paraesophageal hernia   . Compression fracture 09/21/2013  . Hepatic encephalopathy syndrome ([place])   . Paraesophageal hiatal hernia 10/17/2015    Past Surgical History  Procedure Laterality Date  . Orif hip fracture  2010    right x2  . Upper gastrointestinal endoscopy  08/05/2007    esophageal ring, hiatal hernia, portal gastropathy  . Splenectomy      age 22  . Burr hole for subdural hematoma  2004  . Upper gastrointestinal endoscopy  10/14/2011  . Colonoscopy  08/2012    moderatel left colon  tics  . Kyphoplasty Bilateral 09/21/2013    Procedure: T12 - L1 KYPHOPLASTY;  Surgeon: Melina Schools, MD;  Location: [place];  Service: Orthopedics;  Laterality: Bilateral;  . Esophagogastroduodenoscopy (egd) with propofol N/A 01/26/2014    Procedure: ESOPHAGOGASTRODUODENOSCOPY (EGD) WITH PROPOFOL;  Surgeon: Lafayette Dragon, MD;  [facility] END[facility];  Service: Endoscopy;  Laterality: N/A;    There were no vitals filed for this visit.      Subjective Assessment - 12/11/15 1232    Subjective Pt reports still feeling weak, but coming to PT helps she feels more confident with her 4 wheel walker   Pertinent History frequent falls;  (fell last Saturday after getting out of bed, began shaking, "I froze up";  extensive medical history with co-morbidities;  patient reports decreased memory;  TBI    Limitations House hold activities;Walking;Standing;Sitting   How long can you sit comfortably? 5 min   How long can you stand comfortably? 5 min   How long can you walk comfortably? 50 yards   Diagnostic tests x-rays;  Unsure what other diagnostics where done   Patient Stated Goals back pain to go away so I can move on   Currently in Pain? Yes   Pain Score 3   premedicated   Pain Location  Back   Pain Orientation Right;Left   Pain Descriptors / Indicators Aching;Sore   Pain Type Chronic pain   Pain Onset More than a month ago   Pain Frequency Intermittent   Aggravating Factors  standing, unsure about walking, getting up after sitting   Pain Relieving Factors sitting down is helping, but prolonged sitting makes it worse   Multiple Pain Sites No                         [facility] Adult PT Treatment/Exercise - 12/11/15 0001    Ambulation/Gait   Ambulation/Gait Yes   Ambulation/Gait Assistance 6: Modified independent (Device/Increase time)   Ambulation Distance (Feet) 80 Feet   Assistive device Rollator   Gait Pattern Step-through pattern;Decreased stride length;Trunk flexed   Ambulation  Surface Indoor   Exercises   Exercises Lumbar;Knee/Hip   Knee/Hip Exercises: Aerobic   Nustep Level 1 Seat 7, arms 9, 107mn 0.384m   Knee/Hip Exercises: Standing   Other Standing Knee Exercises psoas stretch on stairs 10x each side   Electrical Stimulation   Electrical Stimulation Location instruction in use of home TENS for pain control worn during ex  to pt and partner RaArchivistarameters varies   Electrical Stimulation Goals Pain                  PT Short Term Goals - 12/11/15 1239    PT SHORT TERM GOAL #1   Title The patient will be able to perform initial, basic HEP for hip and core strengthening needed for turning, scooting in bed  12/10/15   Time 4   Period Weeks   Status On-going   PT SHORT TERM GOAL #2   Title The patient will report a 25% improvement in pain with rising from sit to stand   Time 4   Period Weeks   Status On-going   PT SHORT TERM GOAL #3   Title The patient will have improved hip, trunk ROM needed to put on socks and shoes with min assist from roomate   Time 4   Period Weeks   Status On-going           PT Long Term Goals - 12/02/15 1258    PT LONG TERM GOAL #1   Title The patient will be independent in HEP for core/hip strengthening and flexibility for back pain management  01/07/16   Time 8   Period Weeks   Status On-going   PT LONG TERM GOAL #2   Title The patient will report a 50% improvement in pain with ADLs including getting out of bed and out of the chair   Baseline ----   Time 8   Period Weeks   Status On-going   PT LONG TERM GOAL #3   Title The patient will have core/trunk strength grossly 4/5 needed for standing/walking for 10 minutes with min c/o pain   Baseline ------   Time 8   Period Weeks   Status On-going   PT LONG TERM GOAL #4   Title Timed up and go gait speed improved from 29.3 to 16 sec for improved gait safety at home and in the community    Baseline ---   Time 8   Period Weeks   Status On-going   PT LONG TERM GOAL #5   Title FOTO functional outcome score improved from 71% limitation to 50% indicating improved function with  less pain   Baseline MET Berg Balance 46 /56   Time 8   Period Weeks   Status On-going   PT LONG TERM GOAL #6   Title -----   Baseline ----               Plan - 12/11/15 1236    Clinical Impression Statement Pt appears with improved endurance on Nu-step in 1mn 0.355m. Pt with tight hamstrings and iliopsoas   Rehab Potential Good   Clinical Impairments Affecting Rehab Potential High risk of falls;  TBI;  numerous co-morbidities right hip fx;  memory loss   PT Frequency 2x / week   PT Duration 8 weeks   PT Treatment/Interventions ADLs/Self Care Home Management;DME Instruction;Gait training;Stair training;Functional mobility training;Therapeutic activities;Therapeutic exercise;Balance training;Neuromuscular re-education;Patient/family education;Manual techniques;Electrical Stimulation;Moist Heat;Ultrasound;Dry needling;Taping   PT Next Visit Plan needs KX every visit secondary to previous neuro PT;  low level core strengthening;  Nu-step;  psoas stretching in doorway;  bridge   PT Home Exercise Plan continue with abdominal strengthening   Consulted and Agree with Plan of Care Patient      Patient will benefit from skilled therapeutic intervention in order to improve the following deficits and impairments:  Abnormal gait, Decreased balance, Decreased activity tolerance, Decreased mobility, Decreased strength, Decreased range of motion, Difficulty walking, Pain, Impaired flexibility  Visit Diagnosis: Muscle weakness (generalized)  Difficulty in walking, not elsewhere classified     Problem List Patient Active Problem List   Diagnosis Date Noted  . Pre-diabetes 11/01/2015  . TBI (traumatic brain injury) (HCBem[facility] Point03/10/2015  . OSA on CPAP 10/25/2015  . Paraesophageal hiatal hernia  10/17/2015  . Chronic low back pain without sciatica 06/28/2015  . OSA (obstructive sleep apnea) 06/12/2015  . Excessive daytime sleepiness 04/11/2015  . Episodic cluster headache, not intractable 04/11/2015  . Obesity, morbid ([facility]CGreat Falls08/18/2016  . Snoring 04/11/2015  . Insomnia 12/27/2014  . Hypothyroidism 09/04/2014  . Cognitive and behavioral changes 07/17/2014  . Confusion 07/17/2014  . History of traumatic brain injury 07/10/2014  . Recurrent falls 06/29/2014  . Osteopenia 04/04/2014  . Esophageal varices in cirrhosis - trace 03/19/2014  . Anemia 02/01/2014  . Chronic kidney disease, unspecified (HCFlow[facility] Branch06/06/2014  . Chronic back pain 02/01/2014  . Stricture and stenosis of esophagus 01/26/2014  . Fall at home 01/24/2014  . Near syncope 01/12/2014  . Loss of weight 01/12/2014  . Constipation 11/18/2013  . Compression fracture 09/21/2013  . Cirrhosis (HCMineral Springs01/26/2015  . Atypical chest pain 07/26/2013  . Ascites 07/19/2013  . Burn 07/03/2013  . Dyspnea 02/03/2013  . Abnormal stress echo 01/19/2013  . Tobacco abuse 10/22/2012  . Allergic conjunctivitis 06/19/2012  . Skin lesion 06/19/2012  . Lumbar spondylosis 12/24/2011  . Obesity (BMI 35.0-39.9 without comorbidity) (HCNorth High Shoals04/16/2013  . Right hip pain 12/01/2011  . Chronic hepatitis C - genotype 1A 10/06/2011  . GERD (gastroesophageal reflux disease) 10/06/2011  . Hepatic encephalopathy syndrome (HCPoint Pleasant12/11/2010  . Back pain 04/07/2011  . 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 THROMBOCYTOPENIA 10/21/2006  . Depression, major, recurrent, moderate (HCBogue02/28/2008  . HYPERTENSION, BENIGN SYSTEMIC 10/21/2006  . Hepatic cirrhosis (HCPushmataha02/28/2008  . Generalized pruritus 10/21/2006    ElRivka BarbaraPTA 12/11/2015 1:23 PM  [facility] [address]ST[address]idd[place]hone: [phone] Fax:  [phone]: KiTUWANDA VOKESRN: [id number] of Birth: 5/April 17, 1961

## 2015-12-16 ENCOUNTER — Encounter: Payer: Self-pay | Admitting: Physical Therapy

## 2015-12-18 IMAGING — RF DG ESOPHAGUS
14 of 24 series · 14 of 24 positions shown · non-contrast
Comparison: 08/04/2007.  CT Abdomen and Pelvis 03/08/2012.

CLINICAL DATA: 53-year-old female with sensation of food being
stuck in the esophagus. Initial encounter. Chronic history of
dysphagia, with multiple prior esophageal dilatations. Small hiatal
hernia identified on a 9665 swallow.

Cirrhosis.
EXAM:
ESOPHOGRAM/BARIUM SWALLOW
TECHNIQUE: Combined double contrast and single contrast examination performed
using effervescent crystals, thick barium liquid, and thin barium
liquid.
FLUOROSCOPY TIME:  4 min and 13 seconds.

[Series 1: run · 1 of 1 slices shown (1 of 14)]
[im 1/1]
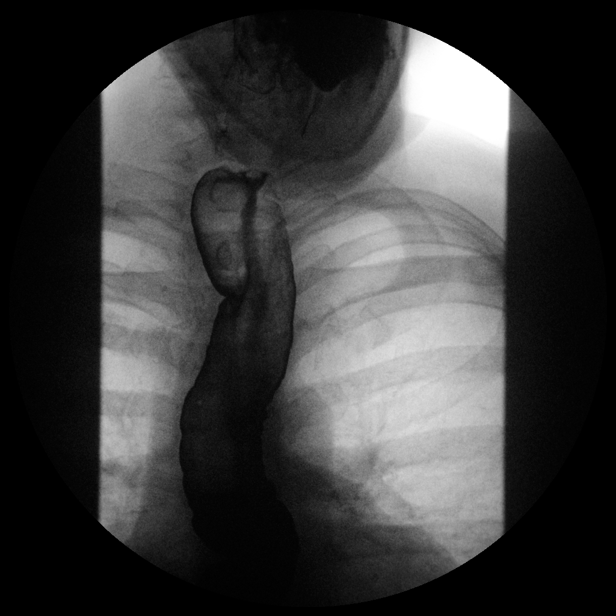

[Series 3: run · 1 of 1 slices shown (2 of 14)]
[im 1/1]
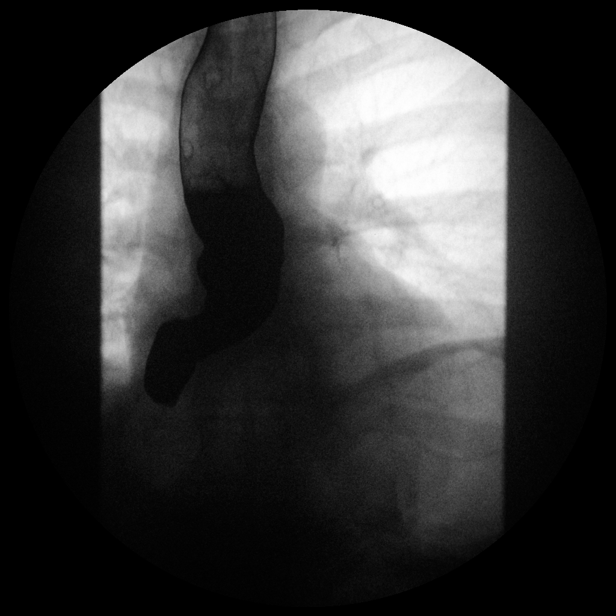

[Series 5: run · 1 of 1 slices shown (3 of 14)]
[im 1/1]
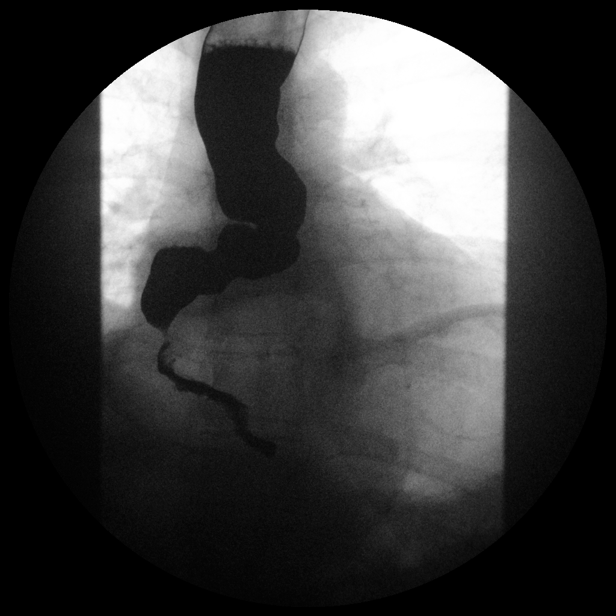

[Series 7: run · 1 of 1 slices shown (4 of 14)]
[im 1/1]
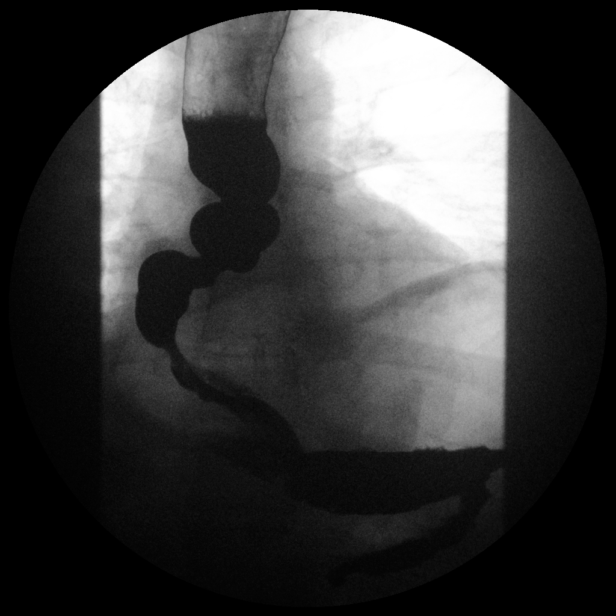

[Series 8: run · 1 of 1 slices shown (5 of 14)]
[im 1/1]
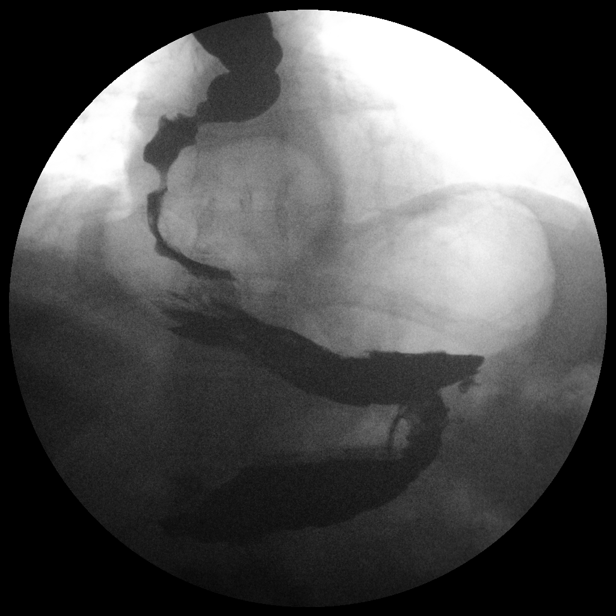

[Series 10: run · 1 of 1 slices shown (6 of 14)]
[im 1/1]
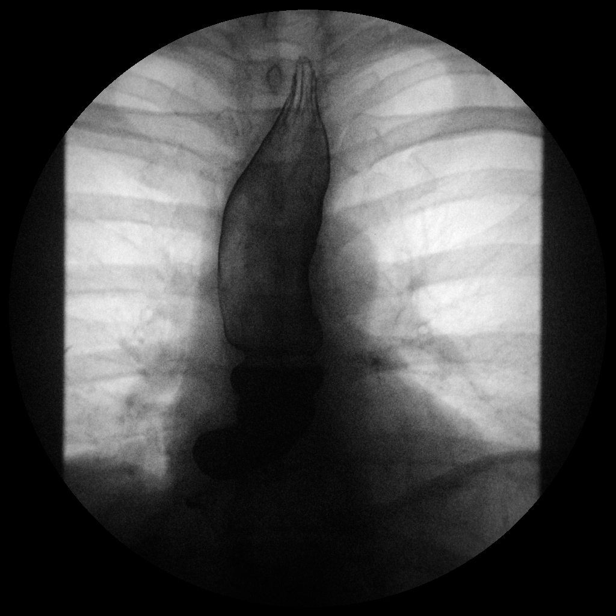

[Series 12: run · 1 of 1 slices shown (7 of 14)]
[im 1/1]
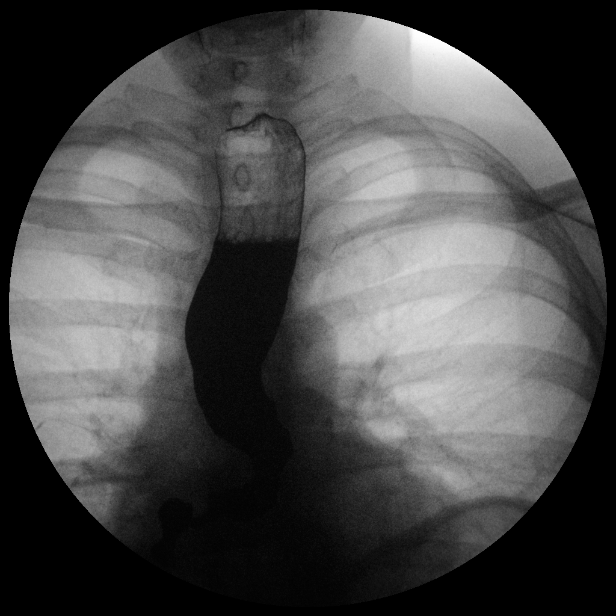

[Series 13: run · 1 of 1 slices shown (8 of 14)]
[im 1/1]
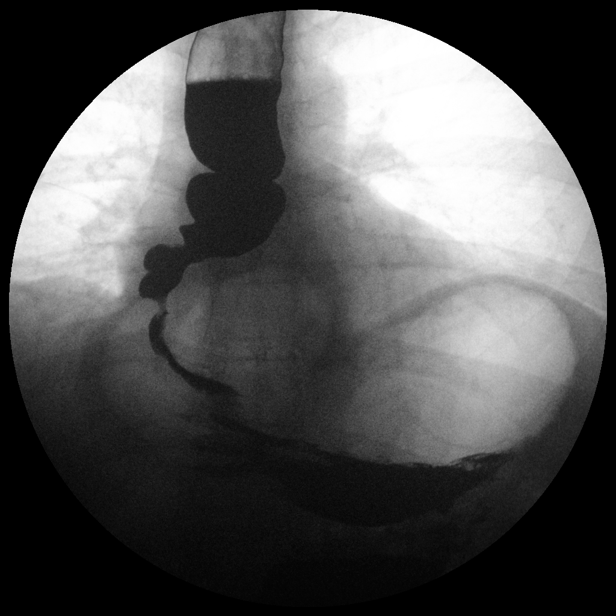

[Series 15: run · 1 of 1 slices shown (9 of 14)]
[im 1/1]
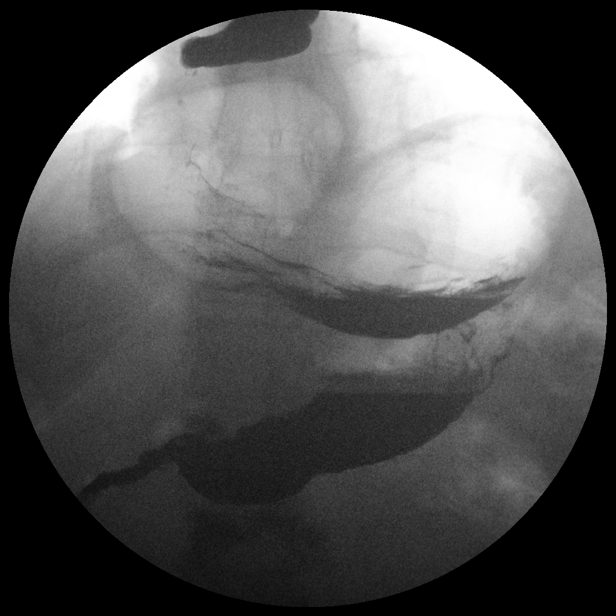

[Series 17: run · 1 of 1 slices shown (10 of 14)]
[im 1/1]
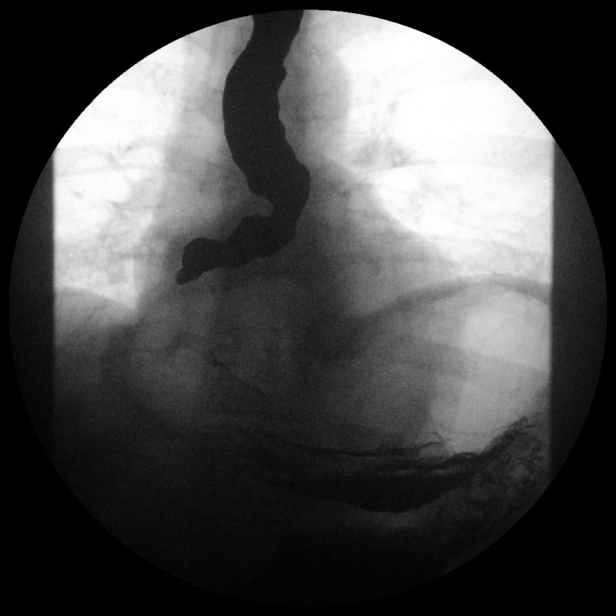

[Series 19: run · 1 of 1 slices shown (11 of 14)]
[im 1/1]
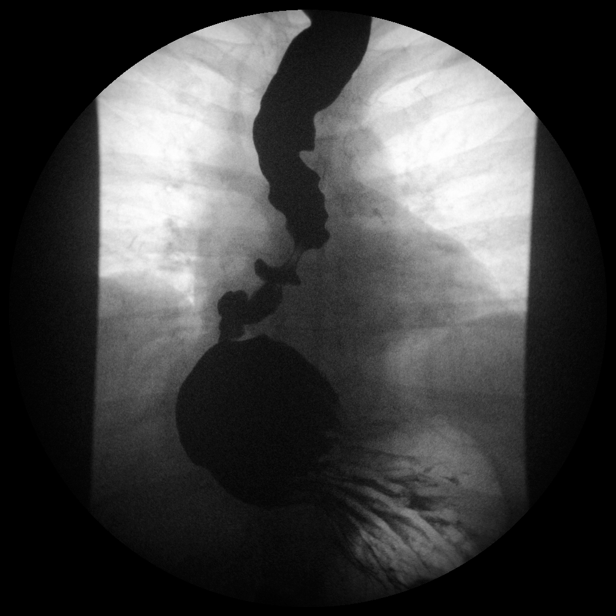

[Series 20: run · 1 of 1 slices shown (12 of 14)]
[im 1/1]
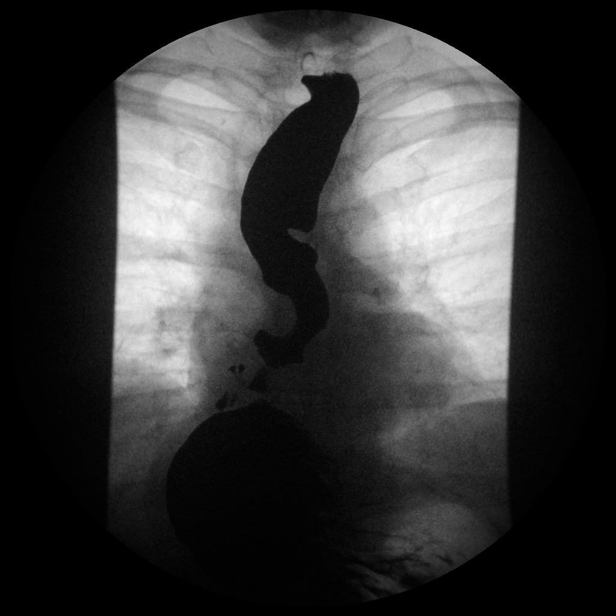

[Series 22: run · 1 of 1 slices shown (13 of 14)]
[im 1/1]
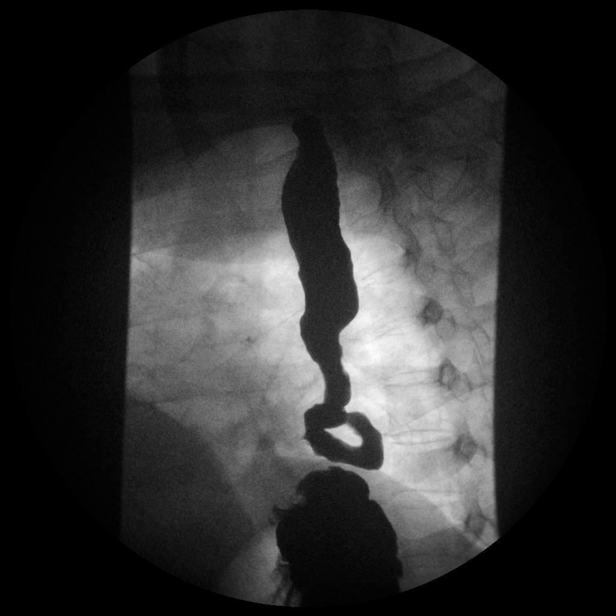

[Series 24: run · 1 of 1 slices shown (14 of 14)]
[im 1/1]
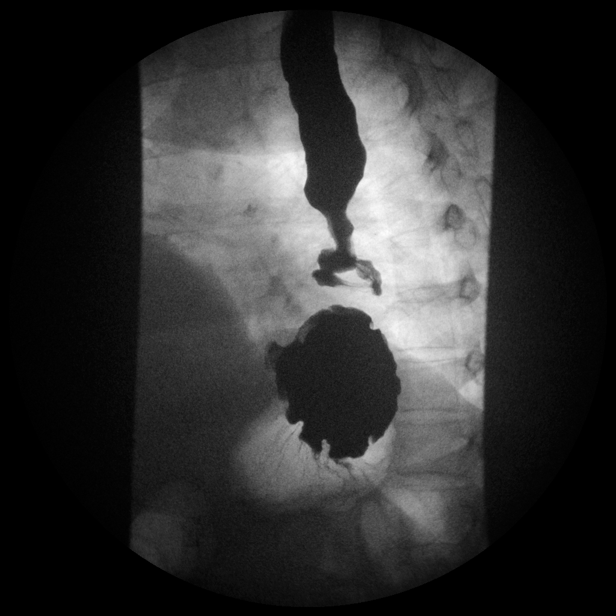

[14 of 24 positions shown; findings below may reference images not displayed]

FINDINGS: The small sliding-type hiatal hernia demonstrated in 9665 was not
apparent on the 6364 CT.

However, today there is a moderate to large gastric hernia which is
a paraesophageal type, and exerts mass effect on the distal thoracic
esophagus just proximal to the GEJ (series 8). The gastroesophageal
junction appears to remain located at the level of the diaphragmatic
hiatus.

In addition to this mass effect, almost constant tertiary
contractions are demonstrated in the distal half of the thoracic
esophagus (series 19).

Subsequently, a column of barium was maintained in the esophagus
throughout the study, and never cleared.

The esophageal contour does not suggest varices, and none were
visible in 6364.

On some images the distal esophagus does have a corkscrew type
appearance (image 23).

Subsequently, the proximal third of the thoracic esophagus is
patulous (series 9).

The esophagus never did clear of barium over the 11 min duration of
the study.

Once barium reached the intra-abdominal stomach, prompt gastric
emptying was noted. Visible proximal small bowel loops appear
unremarkable.
IMPRESSION: 1. New since 6364 moderate size gastric hiatal hernia,
paraesophageal type, exerting mass effect on the distal esophagus
and contributing to poor clearance of barium from the esophagus
throughout the study.
2. Extensive, almost constant tertiary contractions in the distal
thoracic esophagus, sometimes bordering on a "corkscrew esophagus"
type appearance.
3. No definite esophageal varices.

## 2015-12-19 ENCOUNTER — Ambulatory Visit: Payer: Medicare Other | Admitting: Physical Therapy

## 2015-12-19 DIAGNOSIS — R262 Difficulty in walking, not elsewhere classified: Secondary | ICD-10-CM | POA: Diagnosis not present

## 2015-12-19 DIAGNOSIS — R2689 Other abnormalities of gait and mobility: Secondary | ICD-10-CM

## 2015-12-19 DIAGNOSIS — M545 Low back pain, unspecified: Secondary | ICD-10-CM

## 2015-12-19 DIAGNOSIS — R2681 Unsteadiness on feet: Secondary | ICD-10-CM | POA: Diagnosis not present

## 2015-12-19 DIAGNOSIS — M6281 Muscle weakness (generalized): Secondary | ICD-10-CM | POA: Diagnosis not present

## 2015-12-19 DIAGNOSIS — R279 Unspecified lack of coordination: Secondary | ICD-10-CM | POA: Diagnosis not present

## 2015-12-19 NOTE — Therapy (Signed)
[facility] [address], [place], [place], [address] Phone: [phone]   Fax:  [phone]  Physical Therapy Treatment  Patient Details  Name: Wendy [name] MRN: [id number] Date of Birth: 1961-01-08 Referring Provider: Dr. Parks Ranger  Encounter Date: 12/19/2015      PT End of Session - 12/19/15 1302    Visit Number 8   Number of Visits 10   Date for PT Re-Evaluation 2016-01-28   Authorization Type G codes  (needs KX previous PT  at neuro)   PT Start Time 1215   PT Stop Time 1255   PT Time Calculation (min) 40 min   Activity Tolerance Patient tolerated treatment well      Past Medical History  Diagnosis Date  . Hepatic cirrhosis due to chronic hepatitis C infection ([place])     ETOH causative as well  . Hiatal hernia   . Esophageal stricture     esophageal dysmotility and chronic dysphagia as well  . 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 History of alcohol abuse   . Anxiety and depression   . History of hip fracture   . Allergic rhinitis   . HTN (hypertension)   . Blood transfusion   . Cataract     MILD  . GERD (gastroesophageal reflux disease)   . Osteoporosis   . Ulcer     2007  . Arthritis   . Clotting disorder ([place])     prolonged clotting time due to liver disease  . Paraesophageal hernia   . Compression fracture 09/21/2013  . Hepatic encephalopathy syndrome ([place])   . Paraesophageal hiatal hernia 10/17/2015    Past Surgical History  Procedure Laterality Date  . Orif hip fracture  2010    right x2  . Upper gastrointestinal endoscopy  08/05/2007    esophageal ring, hiatal hernia, portal gastropathy  . Splenectomy      age 68  . Burr hole for subdural hematoma  2004  . Upper gastrointestinal endoscopy  10/14/2011  . Colonoscopy  08/2012    moderatel left colon tics  . Kyphoplasty Bilateral 09/21/2013    Procedure: T12 - L1 KYPHOPLASTY;  Surgeon: Melina Schools, MD;   Location: [facility];  Service: Orthopedics;  Laterality: Bilateral;  . Esophagogastroduodenoscopy (egd) with propofol N/A 01/26/2014    Procedure: ESOPHAGOGASTRODUODENOSCOPY (EGD) WITH PROPOFOL;  Surgeon: Lafayette Dragon, MD;  Location: [facility] ENDOSCOPY;  Service: Endoscopy;  Laterality: N/A;    There were no vitals filed for this visit.      Subjective Assessment - 12/19/15 1215    Subjective Presents with 4 wheeled walker;  I took my medicine.  I did something to my left shoulder and it hurts.  I think therapy is helping, I can stand up longer.  Still difficult to get to the bathroom quickly at night time.  I'm doing better with putting on socks and shoes.  Still painful with rising sit to stand.  I have trouble getting up off the floor.  No recent falls but had a "spell"  last night.     Currently in Pain? Yes   Pain Score 4    Pain Location Back   Pain Orientation Right;Left   Pain Type Chronic pain                         [facility] Adult PT Treatment/Exercise - 12/19/15 0001    Ambulation/Gait   Ambulation/Gait Yes  Ambulation/Gait Assistance 6: Modified independent (Device/Increase time)   Ambulation Distance (Feet) 80 Feet   Assistive device Rollator   Gait velocity 40 sec   Gait Comments Additional 90 sec with RW   Lumbar Exercises: Stretches   Active Hamstring Stretch 2 reps;30 seconds  seated    Lumbar Exercises: Seated   Other Seated Lumbar Exercises sit to stand from high table no UEs 2x 5   Other Seated Lumbar Exercises red plyo ball hip swings, chops, golf swings, [employer] 30 sec each   Knee/Hip Exercises: Aerobic   Nustep Level 1 Seat 7, arms 9, 14min 0.32mph   Knee/Hip Exercises: Standing   Hip ADduction AROM;Right;Left;10 reps   Hip Extension AROM;Right;Left;10 reps;Knee straight   Other Standing Knee Exercises side stepping at the counter 8 feet 4x   Knee/Hip Exercises: Seated   Ball Squeeze 15x   Clamshell with TheraBand Green  15x   Other Seated  Knee/Hip Exercises heel to shin slides 5x R/L                  PT Short Term Goals - 12/19/15 1309    PT SHORT TERM GOAL #1   Title The patient will be able to perform initial, basic HEP for hip and core strengthening needed for turning, scooting in bed  12/10/15   Status Achieved   PT SHORT TERM GOAL #2   Title The patient will report a 25% improvement in pain with rising from sit to stand   Time 4   Period Weeks   Status On-going   PT SHORT TERM GOAL #3   Title The patient will have improved hip, trunk ROM needed to put on socks and shoes with min assist from roomate   Status Achieved           PT Long Term Goals - 12/19/15 1309    PT LONG TERM GOAL #1   Title The patient will be independent in HEP for core/hip strengthening and flexibility for back pain management  01/07/16   Time 8   Period Weeks   Status On-going   PT LONG TERM GOAL #2   Title The patient will report a 50% improvement in pain with ADLs including getting out of bed and out of the chair   Time 8   Period Weeks   Status On-going   PT LONG TERM GOAL #3   Title The patient will have core/trunk strength grossly 4/5 needed for standing/walking for 10 minutes with min c/o pain   Time 8   Period Weeks   Status On-going   PT LONG TERM GOAL #4   Title Timed up and go gait speed improved from 29.3 to 16 sec for improved gait safety at home and in the community   Time 8   Period Weeks   Status On-going   PT LONG TERM GOAL #5   Title FOTO functional outcome score improved from 71% limitation to 50% indicating improved function with less pain   Time 8   Period Weeks   Status On-going               Plan - 12/19/15 1303    Clinical Impression Statement The patient continues to have decreased muscle length right HS and right hip rotators.  She reports minor functional improvements but no improvement in pain.  Fatigues quickly in standing.  Therapist providing close supervision with standing  exercises and ambulation for safety.    Some progress with STGs and  LTGs.  Co-morbiities including cognitive impairments will slow progress.     PT Next Visit Plan needs KX every visit secondary to previous neuro PT;  standing LE strengthening, low level balance;  sit to stand; needs G code in 2 more visits      Patient will benefit from skilled therapeutic intervention in order to improve the following deficits and impairments:     Visit Diagnosis: Muscle weakness (generalized)  Other abnormalities of gait and mobility  Bilateral low back pain without sciatica     Problem List Patient Active Problem List   Diagnosis Date Noted  . Pre-diabetes 11/01/2015  . TBI (traumatic brain injury) ([place]) 10/25/2015  . OSA on CPAP 10/25/2015  . Paraesophageal hiatal hernia 10/17/2015  . Chronic low back pain without sciatica 06/28/2015  . OSA (obstructive sleep apnea) 06/12/2015  . Excessive daytime sleepiness 04/11/2015  . Episodic cluster headache, not intractable 04/11/2015  . Obesity, morbid ([place]) 04/11/2015  . Snoring 04/11/2015  . Insomnia 12/27/2014  . Hypothyroidism 09/04/2014  . Cognitive and behavioral changes 07/17/2014  . Confusion 07/17/2014  . History of traumatic brain injury 07/10/2014  . Recurrent falls 06/29/2014  . Osteopenia 04/04/2014  . Esophageal varices in cirrhosis - trace 03/19/2014  . Anemia 02/01/2014  . Chronic kidney disease, unspecified ([place]) 02/01/2014  . Chronic back pain 02/01/2014  . Stricture and stenosis of esophagus 01/26/2014  . Fall at home 01/24/2014  . Near syncope 01/12/2014  . Loss of weight 01/12/2014  . Constipation 11/18/2013  . Compression fracture 09/21/2013  . Cirrhosis ([place]) 09/18/2013  . Atypical chest pain 07/26/2013  . Ascites 07/19/2013  . Burn 07/03/2013  . Dyspnea 02/03/2013  . Abnormal stress echo 01/19/2013  . Tobacco abuse 10/22/2012  . Allergic conjunctivitis 06/19/2012  . Skin lesion 06/19/2012  . Lumbar  spondylosis 12/24/2011  . Obesity (BMI 35.0-39.9 without comorbidity) ([place]) 12/08/2011  . Right hip pain 12/01/2011  . Chronic hepatitis C - genotype 1A 10/06/2011  . GERD (gastroesophageal reflux disease) 10/06/2011  . Hepatic encephalopathy syndrome ([place]) 07/28/2011  . Back pain 04/07/2011  . 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 THROMBOCYTOPENIA 10/21/2006  . Depression, major, recurrent, moderate ([place]) 10/21/2006  . HYPERTENSION, BENIGN SYSTEMIC 10/21/2006  . Hepatic cirrhosis ([place]) 10/21/2006  . Generalized pruritus 10/21/2006    Ruben Im, PT 12/19/2015 1:13 PM Phone: [phone] Fax: [phone]  Alvera Singh 12/19/2015, 1:12 PM  [facility] [address] [place], [place], [address] Phone: [phone]   Fax:  [phone]  Name: Wendy [name] MRN: [id number] Date of Birth: Dec 24, 1960

## 2015-12-20 ENCOUNTER — Encounter: Payer: Self-pay | Admitting: Physical Medicine & Rehabilitation

## 2015-12-20 ENCOUNTER — Ambulatory Visit (HOSPITAL_BASED_OUTPATIENT_CLINIC_OR_DEPARTMENT_OTHER): Payer: Medicare Other | Admitting: Physical Medicine & Rehabilitation

## 2015-12-20 ENCOUNTER — Encounter: Payer: Medicare Other | Attending: Physical Medicine & Rehabilitation

## 2015-12-20 VITALS — BP 110/66 | HR 66

## 2015-12-20 DIAGNOSIS — M6281 Muscle weakness (generalized): Secondary | ICD-10-CM | POA: Diagnosis not present

## 2015-12-20 DIAGNOSIS — G8929 Other chronic pain: Secondary | ICD-10-CM

## 2015-12-20 DIAGNOSIS — F4320 Adjustment disorder, unspecified: Secondary | ICD-10-CM | POA: Insufficient documentation

## 2015-12-20 DIAGNOSIS — Z87820 Personal history of traumatic brain injury: Secondary | ICD-10-CM | POA: Diagnosis not present

## 2015-12-20 DIAGNOSIS — K7460 Unspecified cirrhosis of liver: Secondary | ICD-10-CM | POA: Diagnosis not present

## 2015-12-20 DIAGNOSIS — M549 Dorsalgia, unspecified: Secondary | ICD-10-CM

## 2015-12-20 DIAGNOSIS — F329 Major depressive disorder, single episode, unspecified: Secondary | ICD-10-CM | POA: Diagnosis not present

## 2015-12-20 DIAGNOSIS — G4710 Hypersomnia, unspecified: Secondary | ICD-10-CM | POA: Diagnosis not present

## 2015-12-20 DIAGNOSIS — S069X9A Unspecified intracranial injury with loss of consciousness of unspecified duration, initial encounter: Secondary | ICD-10-CM | POA: Insufficient documentation

## 2015-12-20 DIAGNOSIS — M545 Low back pain: Secondary | ICD-10-CM | POA: Insufficient documentation

## 2015-12-20 DIAGNOSIS — G4733 Obstructive sleep apnea (adult) (pediatric): Secondary | ICD-10-CM | POA: Insufficient documentation

## 2015-12-20 DIAGNOSIS — E039 Hypothyroidism, unspecified: Secondary | ICD-10-CM | POA: Insufficient documentation

## 2015-12-20 DIAGNOSIS — I509 Heart failure, unspecified: Secondary | ICD-10-CM | POA: Insufficient documentation

## 2015-12-20 DIAGNOSIS — R262 Difficulty in walking, not elsewhere classified: Secondary | ICD-10-CM | POA: Diagnosis not present

## 2015-12-20 DIAGNOSIS — R7303 Prediabetes: Secondary | ICD-10-CM | POA: Diagnosis present

## 2015-12-20 DIAGNOSIS — R0683 Snoring: Secondary | ICD-10-CM | POA: Insufficient documentation

## 2015-12-20 NOTE — Progress Notes (Signed)
55 year old female with history of lumbar spondylosis and chronic low back pain. Patient has had some relief with lumbar medial branch blocks however she did not get good relief with her last radiofrequency neurotomy. Her pain has been more right-sided than left-sided. Her last radiofrequency neurotomy was greater than 6 months ago. Her pain has not changed or increased in any way. She continues to have problems with her balance and has had some falls. She ambulates with a walker. Her past medical history is significant for traumatic brain injury.  Past surgical history negative for lumbar surgery Patient has had lumbar kyphoplasty for T12 compression fracture By Dr. Rolena Infante  Examination Gen. No acute distress mood and affect are appropriate Motor strength is 5/5 bilateral hip flexor and knee extensor ankle dorsiflexor There is no evidence of ataxia   mood and affect are blunted Back has no tenderness palpation lumbar paraspinals or in the sacroiliac area. No evidence of spinal deformity No pain with range of motion of the hips no pain to palpation over the greater trochanter of the hips. Gait is with a rolling walker she has no evidence of total drag or knee instability  Impression 1. Lumbar spondylosisimprovement after lumbar radiofrequency neurotomy. Do not think repeat his likely to be helpful. She is getting medication through her primary care office and this can be continued. She'll call if the character or intensity of her pain increases and we can reassess.  2. Traumatic brain injury currently getting some physical therapy for her balance. This likely going to be a chronic issue.

## 2015-12-20 NOTE — Patient Instructions (Signed)
Please call to make an appointment if there are any changes or worsening of your back problems.  Please use walker at all times, you do have a history of traumatic brain injury which can cause balance problems

## 2015-12-20 NOTE — Progress Notes (Signed)
Subjective:    Patient ID: Wendy [name], female    DOB: August 14, 1961, 55 y.o.   MRN: [id number]  HPI  Pain Inventory Average Pain 7 Pain Right Now 7 My pain is tingling and aching  In the last 24 hours, has pain interfered with the following? General activity 10 Relation with others 8 Enjoyment of life 10 What TIME of day is your pain at its worst? evening Sleep (in general) NA  Pain is worse with: walking, bending, sitting, inactivity, standing and some activites Pain improves with: rest, pacing activities and TENS Relief from Meds: 1  Mobility walk without assistance ability to climb steps?  yes do you drive?  no  Function I need assistance with the following:  dressing, bathing, meal prep, household duties and shopping  Neuro/Psych bladder control problems bowel control problems weakness tingling trouble walking dizziness confusion depression anxiety  Prior Studies Any changes since last visit?  no  Physicians involved in your care Any changes since last visit?  no   Family History  Problem Relation Age of Onset  . Heart disease Mother   . Anxiety disorder Mother   . Colon cancer Neg Hx   . Other Daughter     chromosome abnormalit  . Other Daughter     micropthalmia   Social History   Social History  . Marital Status: Widowed    Spouse Name: N/A  . Number of Children: 2  . Years of Education: 12+   Occupational History  . Disabled    Social History Main Topics  . Smoking status: Current Every Day Smoker -- 0.25 packs/day for 10 years    Types: Cigarettes    Last Attempt to Quit: 12/23/2008  . Smokeless tobacco: Never Used  . Alcohol Use: No     Comment: history of attending AA  . Drug Use: No  . Sexual Activity: No   Other Topics Concern  . None   Social History Narrative   Just before her husband died, she had a miscarriage and started to drink beer heavily.  She quit secondary to her liver disease and has been quit for  several years.  She denies the use of drugs - prescription or otherwise.     Past Surgical History  Procedure Laterality Date  . Orif hip fracture  2010    right x2  . Upper gastrointestinal endoscopy  08/05/2007    esophageal ring, hiatal hernia, portal gastropathy  . Splenectomy      age 70  . Burr hole for subdural hematoma  2004  . Upper gastrointestinal endoscopy  10/14/2011  . Colonoscopy  08/2012    moderatel left colon tics  . Kyphoplasty Bilateral 09/21/2013    Procedure: T12 - L1 KYPHOPLASTY;  Surgeon: Melina Schools, MD;  Location: [facility];  Service: Orthopedics;  Laterality: Bilateral;  . Esophagogastroduodenoscopy (egd) with propofol N/A 01/26/2014    Procedure: ESOPHAGOGASTRODUODENOSCOPY (EGD) WITH PROPOFOL;  Surgeon: Lafayette Dragon, MD;  Location: [facility] ENDOSCOPY;  Service: Endoscopy;  Laterality: N/A;   Past Medical History  Diagnosis Date  . Hepatic cirrhosis due to chronic hepatitis C infection ([place])     ETOH causative as well  . Hiatal hernia   . Esophageal stricture     esophageal dysmotility and chronic dysphagia as well  . 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 History of alcohol abuse   . Anxiety and depression   . History of hip fracture   . Allergic  rhinitis   . HTN (hypertension)   . Blood transfusion   . Cataract     MILD  . GERD (gastroesophageal reflux disease)   . Osteoporosis   . Ulcer     2007  . Arthritis   . Clotting disorder ([place])     prolonged clotting time due to liver disease  . Paraesophageal hernia   . Compression fracture 09/21/2013  . Hepatic encephalopathy syndrome ([place])   . Paraesophageal hiatal hernia 10/17/2015   BP 110/66 mmHg  Pulse 66  SpO2 96%  Opioid Risk Score:   Fall Risk Score:  `1  Depression screen PHQ 2/9  Depression screen [facility] 2/9 11/01/2015 03/14/2015 01/31/2015 12/13/2014 09/04/2014 01/18/2014 01/11/2014  Decreased Interest 0 3 1 3  0 0 1  Down, Depressed, Hopeless 0 2 1 2 3  0 1  PHQ - 2 Score 0 5  2 5 3  0 2  Altered sleeping - 3 1 1  - - -  Tired, decreased energy - 3 1 2  - - -  Change in appetite - 3 1 0 - - -  Feeling bad or failure about yourself  - 2 1 1  - - -  Trouble concentrating - 3 1 3  - - -  Moving slowly or fidgety/restless - 2 1 0 - - -  Suicidal thoughts - 0 0 0 - - -  PHQ-9 Score - 21 8 12  - - -  Difficult doing work/chores - - - Somewhat difficult - - -     Review of Systems  Constitutional: Positive for diaphoresis and unexpected weight change.  Respiratory: Positive for cough.   Cardiovascular: Positive for leg swelling.  Gastrointestinal: Positive for abdominal pain and constipation.  Endocrine:       High blood sugar  Genitourinary: Positive for dysuria.  All other systems reviewed and are negative.      Objective:   Physical Exam        Assessment & Plan:

## 2015-12-20 NOTE — Progress Notes (Deleted)
  PROCEDURE RECORD [facility]   Name: Wendy [name] DOB:1960-11-21 MRN: [id number]  Date:12/20/2015  Physician: Alysia Penna, MD    Nurse/CMA: Quiana Cobaugh RN  Allergies:  Allergies  Allergen Reactions  . Codeine Phosphate Itching  . Codeine Rash    Consent Signed: {yes no:[id number]}  Is patient diabetic? No.  CBG today?   Pregnant: No. LMP: No LMP recorded. Patient is postmenopausal. (age 55-55)  Anticoagulants: no Anti-inflammatory: no Antibiotics: no  Procedure:  Position: Prone Start Time:  End Time:   Fluoro Time:  RN/CMA Biomedical engineer    Time 8:43     BP 110/66     Pulse 66     Respirations 16     O2 Sat 98     S/S 6     Pain Level 7/10      D/C home with [name], patient A & O X 3, D/C instructions reviewed, and sits independently.

## 2015-12-25 ENCOUNTER — Other Ambulatory Visit: Payer: Self-pay | Admitting: Family Medicine

## 2015-12-25 DIAGNOSIS — L299 Pruritus, unspecified: Secondary | ICD-10-CM

## 2015-12-26 ENCOUNTER — Telehealth: Payer: Self-pay | Admitting: Family Medicine

## 2015-12-26 ENCOUNTER — Encounter: Payer: Self-pay | Admitting: Physical Therapy

## 2015-12-26 NOTE — Telephone Encounter (Signed)
Pt is calling because the [facility] would like additional visits for the patient. Please make sure that this is covered by her insurance. jw

## 2015-12-26 NOTE — Telephone Encounter (Signed)
[facility] at [facility] [phone]), discussed Cuba Jackowski's concerns with them. They are fully aware of this issue and have already discussed it with Joelene Millin. Given that she has [employer] there is an allotted amount of PT visits up to near 6, and she is at 24 visits currently. They advised her that she can finish out her final 6 visits (they will schedule these with her) and then stop PT at that point, however if she goes beyond 30 or sometimes even fewer such as 28-29 she may be BILLED by medicare for the PT. Due to medicare processing times this is unpredictable and delayed and therefore PT cannot determine her status.  Please call patient back to inform her that I have spoke with Outpatient Rehab. They recommend that she can finish her remaining 6 PT treatments, she should arrange and schedule these with Rehab. However, we cannot be 100% sure that Medicare will not still charge her for at least 1-2 visits, we cannot determine this at this time. If she prefers to be more cautious she may only finish up to 4 to 5 more PT sessions instead.  [name], [place], PGY-3

## 2015-12-30 ENCOUNTER — Ambulatory Visit (INDEPENDENT_AMBULATORY_CARE_PROVIDER_SITE_OTHER): Payer: Medicare Other | Admitting: Family Medicine

## 2015-12-30 ENCOUNTER — Encounter: Payer: Self-pay | Admitting: Family Medicine

## 2015-12-30 VITALS — BP 117/72 | HR 63 | Temp 98.2°F | Ht 62.0 in | Wt 207.0 lb

## 2015-12-30 DIAGNOSIS — T148 Other injury of unspecified body region: Secondary | ICD-10-CM

## 2015-12-30 DIAGNOSIS — M549 Dorsalgia, unspecified: Secondary | ICD-10-CM

## 2015-12-30 DIAGNOSIS — S069X5D Unspecified intracranial injury with loss of consciousness greater than 24 hours with return to pre-existing conscious level, subsequent encounter: Secondary | ICD-10-CM | POA: Diagnosis not present

## 2015-12-30 DIAGNOSIS — Z87820 Personal history of traumatic brain injury: Secondary | ICD-10-CM

## 2015-12-30 DIAGNOSIS — G4733 Obstructive sleep apnea (adult) (pediatric): Secondary | ICD-10-CM

## 2015-12-30 DIAGNOSIS — Z720 Tobacco use: Secondary | ICD-10-CM

## 2015-12-30 DIAGNOSIS — IMO0002 Reserved for concepts with insufficient information to code with codable children: Secondary | ICD-10-CM

## 2015-12-30 DIAGNOSIS — G8929 Other chronic pain: Secondary | ICD-10-CM

## 2015-12-30 MED ORDER — TRAMADOL HCL 50 MG PO TABS: 50.0000 mg | ORAL_TABLET | Freq: Three times a day (TID) | ORAL | Status: DC | PRN

## 2015-12-30 NOTE — Patient Instructions (Addendum)
Thank you for coming in to clinic today.  1. For back pain - Refilled Tramadol may take 2 tablets up to 3 times a day, given you increased # of pills for now - DISCONTINUE Baclofen   STOP taking Chantix, but work on reducing amount of cigarettes you are smoking for now  Continue physical therapy (PT) as needed, only up to 6 more visits this year, until next year   Please schedule a [facility] visit with Dr McDiarmid within next 4 to 6 weeks - Dementia evaluation in setting of Traumatic Brain Injury (TBI)  Please schedule a follow-up appointment with New Doctor in 3 months.  If you have any other questions or concerns, please feel free to call the clinic to contact me. You may also schedule an earlier appointment if necessary.  However, if your symptoms get significantly worse, please go to the Emergency Department to seek immediate medical attention.  [name], [place]

## 2015-12-31 ENCOUNTER — Ambulatory Visit: Payer: Medicare Other | Attending: Family Medicine | Admitting: Physical Therapy

## 2015-12-31 ENCOUNTER — Telehealth: Payer: Self-pay | Admitting: Family Medicine

## 2015-12-31 DIAGNOSIS — R2689 Other abnormalities of gait and mobility: Secondary | ICD-10-CM | POA: Insufficient documentation

## 2015-12-31 DIAGNOSIS — M6281 Muscle weakness (generalized): Secondary | ICD-10-CM | POA: Diagnosis not present

## 2015-12-31 DIAGNOSIS — M545 Low back pain, unspecified: Secondary | ICD-10-CM

## 2015-12-31 NOTE — Assessment & Plan Note (Signed)
Chronic problem since 2006, followed by Neurology. Additionally multifactorial with psych dx and sedating medications, progressive memory decline. Underlying cognitive impairment.  Plan: 1. Strongly recommended patient follow-up with [facility] with Dr McDiarmid for full cognitive assessment, considered neuropsych referral but patient no longer accepted at [facility] 2. Discontinue Baclofen reduce sedating med 3. Future discussed titration down on Clonazepam 4. F/u with neurology

## 2015-12-31 NOTE — Assessment & Plan Note (Addendum)
Stable to gradual worsening, chronic LBP, known lumbar DJD, h/o compression fractures - Followed by Dr. Letta Pate (PM&R) for nerve blocks, injections, ablation  Plan 1. Increased Tramadol to allow up to x 2 of the 50mg  tabs TID PRN pain, seems to help and not sedating per patient 2. Discontinue Baclofen - muscle relaxants not helping and adding to inc sedation risk 3. Discussed titration down on Clonazepam, will not start today 4. Continue PT - however only x 6 sessions available for rest of 2017, (alotted 30 for medicare), she understands this and may go once more and then return if needed 5. Continue f/u with PM&R as scheduled - limited options available at this time 6. RTC 3 mo chronic pain, consider switching Lexapro to Cymbalta (asked patient to discuss with behavioral health at next apt, or we can consider this change in future)

## 2015-12-31 NOTE — Telephone Encounter (Signed)
It is fine for her to take OTC Biotin. This can help nails, skin, and hair among other things. It is a safe supplement, and there should be no harm in taking this medication.  Please let her know that she may continue taking Biotin. It is safe to take.  [name], [place], PGY-3

## 2015-12-31 NOTE — Assessment & Plan Note (Signed)
Chronic problem without new acute fracture or recurrence. Remains high risk with frequent falls and gait problems - Remains on Fosamax 70mg  weekly, for h/o compression frx T12-L1 earlier than 2015, has had surgery on this kyphoplasty in 2015. Chart review shows start date bisphosphonate 2013, so about 4 years of treatment currently. - Patient inquiring about coverage of injectable bisphos meds, will investigate this for her

## 2015-12-31 NOTE — Assessment & Plan Note (Signed)
Likely multifactorial with OSA and possible central sleep apnea, had CPAP with O2 but non-adherent, now awaiting re-establish with Dr Juliane Lack Pulmonology no longer followed by [facility] - Will defer further CPAP orders at this time to Pulmonology

## 2015-12-31 NOTE — Telephone Encounter (Signed)
Pt called and said that she wanted the doctor to know that she is taking OTC Biotin. She wanted to know what he thought about that. Wendy [name]

## 2015-12-31 NOTE — Progress Notes (Signed)
Subjective:    Patient ID: Wendy [name], female    DOB: 10/12/1960, 55 y.o.   MRN: [id number]  Wendy [name] is a 55 y.o. female presenting on 12/30/2015 for Follow-up  HPI  FOLLOW-UP / MEMORY LOSS / COGNITIVE DEFICITS / H/o TRAUMATIC BRAIN INJURY - Known chronic history with prior TBI 2006 from prior injury (s/p coma), additional neuro-psych insults with chronic depression, anxiety, panic disorder, agoraphobia.  - No longer patient at [facility] due to non-adherence and missed apt, now established with [facility] Neurology Dr Tomi Likens (last seen 10/2015) with cognitive impairment, last MRI 08/2014 with chronic R-frontal encephalomalacia and mod subcortical changes / post-op changes stable since 2006. - Today she asks about possibility of dementia. She understands her chronic worsening memory loss. - Denies any behavior changes or agitation, HA, loss of vision, recent head trauma or injury  OSA on CPAP - Chronic OSA prior on CPAP, known TBI as above. OSA risk factors with obesity and large neck circumference, also at risk for central sleep apnea with TBI, complicated by sedating medications including BDZ, she has documented prior poor sleep hygiene. Established with Psych for anxiety, panic, depression. - last sleep study 05/2015, diagnosed with mild OSA with AHI 6.3 and hypoxemia, she had CPAP titration overnight in 06/2015, but had non-adherence with CPAP and supplemental O2 - She is no longer established with [facility], now has been arranged to follow-up for CPAP and OSA at [place], scheduled to see Dr Elsworth Soho on 01/30/16 - Today she requests new "order for CPAP" and is unclear what equipment she needs at home. - Admits sleepiness often - Denies any dyspnea, overnight worsening apnea spells  FOLLOW-UP / BACK PAIN, CHRONIC / RECURRENT FALLS: - Known chronic problems with chronic back pain with lumbar DJD, see prior notes with additional contributing history including osteopenia s/p repaired  compression frx, frequent falls with h/o TBI. Followed by PM&R and Neurology. - Today reports chronic back pain is persistent, seems to be improved on higher dose Tramadol 2 tabs up to TID, but rx # was not sufficient supply only 120 tabs, she states baclofen is not helping and she has tried taking this several times a day. She denies that either medication makes her drowsy. - Followed by PT actively for LBP, balance, gait - Denies any numbness or weakness, bladder or bowel incontinence, saddle anesthesia  TOBACCO ABUSE - Resumed smoking end of 2016, started on Chantix at that time but unable to completely quit. It has helped her in the past, however she is unsure if it is helping currently. She is not ready to quit smoking at this time. Has been off Chantix for several weeks now  Immunizations - Asks about zostavax - not indicated at her age - She will consider TDap  Social History  Substance Use Topics  . Smoking status: Current Every Day Smoker -- 0.25 packs/day for 10 years    Types: Cigarettes    Last Attempt to Quit: 12/23/2008  . Smokeless tobacco: Never Used  . Alcohol Use: No     Comment: history of attending AA    Review of Systems Per HPI unless specifically indicated above     Objective:    BP 117/72 mmHg  Pulse 63  Temp(Src) 98.2 F (36.8 C) (Oral)  Ht 5\' 2"  (1.575 m)  Wt 207 lb (93.895 kg)  BMI 37.85 kg/m2  Wt Readings from Last 3 Encounters:  12/30/15 207 lb (93.895 kg)  11/28/15 209 lb (94.802 kg)  11/01/15 205 lb 4.8 oz (93.123 kg)    Physical Exam  Constitutional: She is oriented to person, place, and time. She appears well-developed and well-nourished. No distress.  OBese, chronically ill-appearing, comfortable, cooperative  HENT:  Head: Normocephalic and atraumatic.  Mouth/Throat: Oropharynx is clear and moist.  Neck: Normal range of motion. Neck supple.  Cardiovascular: Normal rate, regular rhythm, normal heart sounds and intact distal pulses.   No  murmur heard. Pulmonary/Chest: Effort normal and breath sounds normal. No respiratory distress. She has no wheezes. She has no rales.  Musculoskeletal: She exhibits no edema.  Low Back Inspection: Large body habitus, no spinal deformity, symmetrical. Palpation: Mild bilateral lumbar paraspinal muscles tender ROM: Limited active ROM forward flex / back ext due to body habitus but mostly intact Special Testing: Seated SLR negative for radicular pain bilaterally  Strength: Bilateral hip flex/ext 5/5, knee flex/ext 5/5, ankle dorsiflex/plantarflex 5/5 Neurovascular: intact distal sensation to light touch  Neurological: She is alert and oriented to person, place, and time.  Uses walker with slow gait  Skin: Skin is warm and dry. She is not diaphoretic.  Psychiatric: She has a normal mood and affect. Her behavior is normal.  Nursing note and vitals reviewed.      Assessment & Plan:   Problem List Items Addressed This Visit    Tobacco abuse    Continues to smoke  Plan: 1. Discontinue Chantix - has not adhered to treatment appropriately and when took initial course a few months ago did not seem to facilitate quitting. She did not adhere to quit date. 2. Counseled on routine smoking cessation, 1800 quitline info given 3. Follow-up within 3-6 months on smoking cessation, consider Dr Valentina Lucks - future unlikely to benefit from Chantix, caution with Wellbutrin given tramadol and other psych doses      TBI (traumatic brain injury) [facility])    Chronic problem since 2006, followed by Neurology. Additionally multifactorial with psych dx and sedating medications, progressive memory decline. Underlying cognitive impairment.  Plan: 1. Strongly recommended patient follow-up with [facility] with Dr McDiarmid for full cognitive assessment, considered neuropsych referral but patient no longer accepted at [facility] 2. Discontinue Baclofen reduce sedating med 3. Future discussed titration down on  Clonazepam 4. F/u with neurology      OSA (obstructive sleep apnea)    Likely multifactorial with OSA and possible central sleep apnea, had CPAP with O2 but non-adherent, now awaiting re-establish with Dr Juliane Lack Pulmonology no longer followed by [facility] - Will defer further CPAP orders at this time to Pulmonology      History of traumatic brain injury   Compression fracture    Chronic problem without new acute fracture or recurrence. Remains high risk with frequent falls and gait problems - Remains on Fosamax 70mg  weekly, for h/o compression frx T12-L1 earlier than 2015, has had surgery on this kyphoplasty in 2015. Chart review shows start date bisphosphonate 2013, so about 4 years of treatment currently. - Patient inquiring about coverage of injectable bisphos meds, will investigate this for her      Chronic back pain - Primary    Stable to gradual worsening, chronic LBP, known lumbar DJD, h/o compression fractures - Followed by Dr. Letta Pate (PM&R) for nerve blocks, injections, ablation  Plan 1. Increased Tramadol to allow up to x 2 of the 50mg  tabs TID PRN pain, seems to help and not sedating per patient 2. Discontinue Baclofen - muscle relaxants not helping and adding to inc sedation risk 3.  Discussed titration down on Clonazepam, will not start today 4. Continue PT - however only x 6 sessions available for rest of 2017, (alotted 30 for medicare), she understands this and may go once more and then return if needed 5. Continue f/u with PM&R as scheduled - limited options available at this time 6. RTC 3 mo chronic pain, consider switching Lexapro to Cymbalta (asked patient to discuss with behavioral health at next apt, or we can consider this change in future)      Relevant Medications   traMADol (ULTRAM) 50 MG tablet      Meds ordered this encounter  Medications  . traMADol (ULTRAM) 50 MG tablet    Sig: Take 1-2 tablets (50-100 mg total) by mouth every 8 (eight) hours as  needed (For pain.).    Dispense:  180 tablet    Refill:  2    Follow up plan: Return in about 3 months (around 03/31/2016) for chronic pain, follow-up.  [name], [place], PGY-3

## 2015-12-31 NOTE — Therapy (Signed)
[facility] [address], [place], [place], [address] Phone: [phone]   Fax:  [phone]  Physical Therapy Treatment  Patient Details  Name: Wendy [name] MRN: [id number] Date of Birth: Apr 02, 1961 Referring Provider: Dr. Parks Ranger  Encounter Date: 12/31/2015      PT End of Session - 12/31/15 1319    Visit Number 9   Number of Visits 10   Date for PT Re-Evaluation 02/01/2016   Authorization Type G codes  (needs KX previous PT  at neuro)   PT Start Time 1230   PT Stop Time 1314   PT Time Calculation (min) 44 min   Activity Tolerance Patient tolerated treatment well      Past Medical History  Diagnosis Date  . Hepatic cirrhosis due to chronic hepatitis C infection ([place])     ETOH causative as well  . Hiatal hernia   . Esophageal stricture     esophageal dysmotility and chronic dysphagia as well  . 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 History of alcohol abuse   . Anxiety and depression   . History of hip fracture   . Allergic rhinitis   . HTN (hypertension)   . Blood transfusion   . Cataract     MILD  . GERD (gastroesophageal reflux disease)   . Osteoporosis   . Ulcer     2007  . Arthritis   . Clotting disorder ([place])     prolonged clotting time due to liver disease  . Paraesophageal hernia   . Compression fracture 09/21/2013  . Hepatic encephalopathy syndrome ([place])   . Paraesophageal hiatal hernia 10/17/2015    Past Surgical History  Procedure Laterality Date  . Orif hip fracture  2010    right x2  . Upper gastrointestinal endoscopy  08/05/2007    esophageal ring, hiatal hernia, portal gastropathy  . Splenectomy      age 51  . Burr hole for subdural hematoma  2004  . Upper gastrointestinal endoscopy  10/14/2011  . Colonoscopy  08/2012    moderatel left colon tics  . Kyphoplasty Bilateral 09/21/2013    Procedure: T12 - L1 KYPHOPLASTY;  Surgeon: Melina Schools, MD;   Location: [place];  Service: Orthopedics;  Laterality: Bilateral;  . Esophagogastroduodenoscopy (egd) with propofol N/A 01/26/2014    Procedure: ESOPHAGOGASTRODUODENOSCOPY (EGD) WITH PROPOFOL;  Surgeon: Lafayette Dragon, MD;  [facility] ENDOSCOPY;  Service: Endoscopy;  Laterality: N/A;    There were no vitals filed for this visit.      Subjective Assessment - 12/31/15 1238    Subjective My back hurts when getting dressed.  I want to practice getting up off the floor if I fall.  The doctors say my memory won't get better because of my brain injury.    Currently in Pain? Yes   Pain Score 7    Pain Location Back   Pain Type Chronic pain                         [facility] Adult PT Treatment/Exercise - 12/31/15 0001    Transfers   Floor to Transfer 4: Min guard;4: Min assist;With upper extremity assist   Lumbar Exercises: Seated   Other Seated Lumbar Exercises sit to stand from high table no UEs 2x 5   Other Seated Lumbar Exercises red plyo ball hip swings, chops, golf swings, Charlie [employer] 30 sec each   Knee/Hip Exercises: Aerobic  Nustep Level 1 Seat 7, arms 9, 71min    Knee/Hip Exercises: Standing   Heel Raises 15 reps   Hip ADduction AROM;Right;Left;15 reps   Hip Extension AROM;Right;Left;15 reps   Other Standing Knee Exercises sidestepping 4x 8 feet                  PT Short Term Goals - 12/31/15 1552    PT SHORT TERM GOAL #1   Title The patient will be able to perform initial, basic HEP for hip and core strengthening needed for turning, scooting in bed  12/10/15   Status Achieved   PT SHORT TERM GOAL #2   Title The patient will report a 25% improvement in pain with rising from sit to stand   Time 4   Period Weeks   Status On-going   PT SHORT TERM GOAL #3   Title The patient will have improved hip, trunk ROM needed to put on socks and shoes with min assist from roomate   Status Achieved           PT Long Term Goals - 12/31/15 1552    PT LONG TERM  GOAL #1   Title The patient will be independent in HEP for core/hip strengthening and flexibility for back pain management  01/07/16   Time 8   Period Weeks   Status On-going   PT LONG TERM GOAL #2   Title The patient will report a 50% improvement in pain with ADLs including getting out of bed and out of the chair   Time 8   Period Weeks   Status On-going   PT LONG TERM GOAL #3   Title The patient will have core/trunk strength grossly 4/5 needed for standing/walking for 10 minutes with min c/o pain   Time 8   Period Weeks   Status On-going   PT LONG TERM GOAL #4   Title Timed up and go gait speed improved from 29.3 to 16 sec for improved gait safety at home and in the community   Time 8   Period Weeks   Status On-going   PT LONG TERM GOAL #5   Title FOTO functional outcome score improved from 71% limitation to 50% indicating improved function with less pain   Time 8   Period Weeks   Status On-going               Plan - 12/31/15 1547    Clinical Impression Statement The patient requests to practice getting up/down off the floor in case she falls.  She is able to get down and off the floor with min assist and verbal cues.  She does need significant use of her UEs to complete task.  She is able to stand and ambulate over a longer period of time before fatiguing.  Her back pain is moderate/severe today but no pain behaviors with exercise.  Therapist closely monitoring for pain and providing close supervision for safety in standing.    She is approaching max potential with PT a this time.  Will check further progress toward goals next visit with probable discharge.     PT Next Visit Plan  KX, g code;  TUG, FOTO:  goal assess;  MMT;  discharge;  gait distance      Patient will benefit from skilled therapeutic intervention in order to improve the following deficits and impairments:     Visit Diagnosis: Muscle weakness (generalized)  Other abnormalities of gait and  mobility  Bilateral low  back pain without sciatica     Problem List Patient Active Problem List   Diagnosis Date Noted  . Pre-diabetes 11/01/2015  . TBI (traumatic brain injury) ([place]) 10/25/2015  . OSA on CPAP 10/25/2015  . Paraesophageal hiatal hernia 10/17/2015  . Chronic low back pain without sciatica 06/28/2015  . OSA (obstructive sleep apnea) 06/12/2015  . Excessive daytime sleepiness 04/11/2015  . Episodic cluster headache, not intractable 04/11/2015  . Obesity, morbid ([place]) 04/11/2015  . Snoring 04/11/2015  . Insomnia 12/27/2014  . Hypothyroidism 09/04/2014  . Cognitive and behavioral changes 07/17/2014  . Confusion 07/17/2014  . History of traumatic brain injury 07/10/2014  . Recurrent falls 06/29/2014  . Osteopenia 04/04/2014  . Esophageal varices in cirrhosis - trace 03/19/2014  . Anemia 02/01/2014  . Chronic kidney disease, unspecified ([place]) 02/01/2014  . Chronic back pain 02/01/2014  . Stricture and stenosis of esophagus 01/26/2014  . Fall at home 01/24/2014  . Near syncope 01/12/2014  . Loss of weight 01/12/2014  . Constipation 11/18/2013  . Compression fracture 09/21/2013  . Cirrhosis ([place]) 09/18/2013  . Atypical chest pain 07/26/2013  . Ascites 07/19/2013  . Burn 07/03/2013  . Dyspnea 02/03/2013  . Abnormal stress echo 01/19/2013  . Tobacco abuse 10/22/2012  . Allergic conjunctivitis 06/19/2012  . Skin lesion 06/19/2012  . Lumbar spondylosis 12/24/2011  . Obesity (BMI 35.0-39.9 without comorbidity) ([place]) 12/08/2011  . Right hip pain 12/01/2011  . Chronic hepatitis C - genotype 1A 10/06/2011  . GERD (gastroesophageal reflux disease) 10/06/2011  . Hepatic encephalopathy syndrome ([place]) 07/28/2011  . Back pain 04/07/2011  . 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 THROMBOCYTOPENIA 10/21/2006  . Depression, major, recurrent, moderate ([place]) 10/21/2006  . HYPERTENSION, BENIGN SYSTEMIC 10/21/2006  . Hepatic cirrhosis ([place]) 10/21/2006  .  Generalized pruritus 10/21/2006    Ruben Im, PT 12/31/2015 3:55 PM Phone: [phone] Fax: [phone]  Alvera Singh 12/31/2015, 3:55 PM  [facility] [address] [place], [place], [address] Phone: [phone]   Fax:  [phone]  Name: Wendy [name] MRN: [id number] Date of Birth: 04-03-1961

## 2015-12-31 NOTE — Assessment & Plan Note (Signed)
Continues to smoke  Plan: 1. Discontinue Chantix - has not adhered to treatment appropriately and when took initial course a few months ago did not seem to facilitate quitting. She did not adhere to quit date. 2. Counseled on routine smoking cessation, 1800 quitline info given 3. Follow-up within 3-6 months on smoking cessation, consider Dr Valentina Lucks - future unlikely to benefit from Chantix, caution with Wellbutrin given tramadol and other psych doses

## 2016-01-01 ENCOUNTER — Telehealth: Payer: Self-pay | Admitting: *Deleted

## 2016-01-01 DIAGNOSIS — Z1283 Encounter for screening for malignant neoplasm of skin: Secondary | ICD-10-CM

## 2016-01-01 NOTE — Telephone Encounter (Signed)
Patient informed, expressed understanding. 

## 2016-01-01 NOTE — Telephone Encounter (Signed)
Patient wants to know if MD could put in an order for PCA services, also wants dermatology referral (says this was discussed at last office visit). Also wants to know if MD could prescribe flexoderm or if he thought this would be a good medication for her.

## 2016-01-02 ENCOUNTER — Ambulatory Visit: Payer: Medicare Other | Admitting: Physical Therapy

## 2016-01-02 DIAGNOSIS — M6281 Muscle weakness (generalized): Secondary | ICD-10-CM | POA: Diagnosis not present

## 2016-01-02 DIAGNOSIS — M545 Low back pain, unspecified: Secondary | ICD-10-CM

## 2016-01-02 DIAGNOSIS — R2689 Other abnormalities of gait and mobility: Secondary | ICD-10-CM | POA: Diagnosis not present

## 2016-01-02 NOTE — Patient Instructions (Signed)
Wendy [name] PT [facility] [address], [address] [place], [place] [address] Phone # [phone] Fax [phone]    
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
## 2016-01-03 NOTE — Telephone Encounter (Signed)
Patient returning MD call

## 2016-01-03 NOTE — Telephone Encounter (Addendum)
Called [facility] back today. I had attempted to reach her yesterday and earlier today. Answered her questions.  1. Request Dermatology referral - for whole body skin check, she had seen Dermatology in the past >5-7 years ago but no longer established and unsure where she was seen before. She has several moles and other skin areas that she would like to be checked by dermatologist. Also advised her that Flexiderm or topical skin products to reduce wrinkles etc have limited efficacy, best option is to discuss with Dermatologist may have free samples or other more affordable recommendations. - Referral order placed in Epic.  2. [employer] Personal Care Assistant services - I spoke with Achilles Dunk CSW yesterday regarding this issue, patient is Medicare and therefore PCS is not covered. However she assembled several documents that list self pay or other options for patient. Including PACE program, this may also be a viable option for her. - She is scheduled for [facility] Clinic visit 02/06/16 - may discuss future care / housing options with her as she does seem to need increasing assistance with her memory loss / history of TBI - will mail packet of info to patient as she requested  Follow-up as planned  [name], [place], PGY-3

## 2016-01-06 ENCOUNTER — Encounter: Payer: Self-pay | Admitting: Family Medicine

## 2016-01-07 ENCOUNTER — Ambulatory Visit: Payer: Self-pay | Admitting: Physical Therapy

## 2016-01-08 ENCOUNTER — Institutional Professional Consult (permissible substitution): Payer: Self-pay | Admitting: Pulmonary Disease

## 2016-01-09 ENCOUNTER — Other Ambulatory Visit: Payer: Self-pay

## 2016-01-09 ENCOUNTER — Encounter: Payer: Self-pay | Admitting: Physical Therapy

## 2016-01-09 DIAGNOSIS — Z1231 Encounter for screening mammogram for malignant neoplasm of breast: Secondary | ICD-10-CM

## 2016-01-10 ENCOUNTER — Other Ambulatory Visit: Payer: Self-pay | Admitting: *Deleted

## 2016-01-10 DIAGNOSIS — F4001 Agoraphobia with panic disorder: Secondary | ICD-10-CM

## 2016-01-13 ENCOUNTER — Telehealth: Payer: Self-pay

## 2016-01-13 DIAGNOSIS — K7469 Other cirrhosis of liver: Secondary | ICD-10-CM

## 2016-01-13 MED ORDER — CLONAZEPAM 1 MG PO TABS: 0.5000 mg | ORAL_TABLET | Freq: Two times a day (BID) | ORAL | Status: DC | PRN

## 2016-01-13 NOTE — Telephone Encounter (Signed)
-----   Message from Marlon Pel, RN sent at 07/12/2015 11:26 AM EST ----- Needs [place]- see results 11/17

## 2016-01-13 NOTE — Telephone Encounter (Signed)
Phoned in Clonazepam refill to pharmacy, for lowered dose as discussed previously by me and by her psychiatrist. Previously on 1mg  BID #60 pills, for month supply. Now lowered rx to 0.5 to 1 tab BID as needed, #45 pills for 1 month, +2 refills.  [name], [place], PGY-3

## 2016-01-13 NOTE — Telephone Encounter (Signed)
She is aware to be NPO for 6 hours prior

## 2016-01-13 NOTE — Telephone Encounter (Signed)
Patient scheduled at [facility] for 01/21/14 9:00. She is notified of the appt date

## 2016-01-14 ENCOUNTER — Telehealth: Payer: Self-pay | Admitting: *Deleted

## 2016-01-14 NOTE — Telephone Encounter (Signed)
I am unsure what she is referring to about Psych. Last saw patient 12/30/15. She was advised to follow-up at [facility] she is already scheduled for this on 02/06/16 at 1:30pm. Otherwise she is currently established with her Psychiatrist at [facility] (Dr Ulice Brilliant) last seen 11/29/15, she can follow-up with them as needed. Only other recent referral was Dermatology for routine skin check-up.  Please call her back to notify her about the upcoming Geriatrics Clinic appointment (that is HERE at [facility] though, let her know that it is same place, not a different clinic).  [name], [place], PGY-3

## 2016-01-14 NOTE — Telephone Encounter (Signed)
Patient states MD was supposed to be setting her up an appt with psych and she hasnt heard anything. Wants to know what MD's plan is regarding this.

## 2016-01-15 ENCOUNTER — Ambulatory Visit: Payer: Self-pay

## 2016-01-15 NOTE — Telephone Encounter (Signed)
Pt informed. Trell Secrist, CMA  

## 2016-01-16 ENCOUNTER — Telehealth: Payer: Self-pay | Admitting: Family Medicine

## 2016-01-16 NOTE — Telephone Encounter (Signed)
Pt called because she was told to see a therapist and was given the number to call. She did call them but they said that they need to speak to the doctor first. Please call the patient since she is confused on what to do next. jw

## 2016-01-17 NOTE — Telephone Encounter (Signed)
Called patient back. She is looking to contact her psychiatry / therapist, not physical therapy as I misunderstood. She was last seen by Dr Ulice Brilliant ([place] in 11/2015), I am assuming they should be able to direct her to her therapist. She states she tried to call but didn't know exactly who she was calling, and they needed to speak to me. I attempted to call several times and could not get through to [facility]. Will try to call again later or on Monday.   [facility] [address] [place], [place] [address] Main: [place], [place], PGY-3

## 2016-01-21 NOTE — Telephone Encounter (Signed)
Called Dr Felicita Gage office ([place] [facility] [place]) [phone], and confirmed that patient Wendy [name] has never established with a therapist there, and she is scheduled to see Dr Ulice Brilliant for follow-up in 2 days on Thursday 01/23/16, they have alrdy notified her of this appointment. I requested if they could arrange her to see a therapist, and they stated that they will inform Dr Ulice Brilliant to arrange referral to Therapist.  I attempted to call patient back, did not reach her but left a voicemail, stating that I spoke to Dr Felicita Gage office and of the above plan to see Dr Ulice Brilliant in 2 days on Thursday, and he will arrange her to see a therapist at that time.  [name], [place], PGY-3

## 2016-01-21 NOTE — Telephone Encounter (Signed)
Pt wanted to check the status to see if MD was able to contact Behavioral health [place], Salome Spotted, [place]

## 2016-01-22 ENCOUNTER — Ambulatory Visit (HOSPITAL_COMMUNITY)
Admission: RE | Admit: 2016-01-22 | Discharge: 2016-01-22 | Disposition: A | Discharge status: Home / Self Care | Payer: Medicare Other | Source: Ambulatory Visit | Attending: Internal Medicine | Admitting: Internal Medicine

## 2016-01-22 DIAGNOSIS — K8020 Calculus of gallbladder without cholecystitis without obstruction: Secondary | ICD-10-CM | POA: Diagnosis not present

## 2016-01-22 DIAGNOSIS — R188 Other ascites: Secondary | ICD-10-CM | POA: Insufficient documentation

## 2016-01-22 DIAGNOSIS — K7469 Other cirrhosis of liver: Secondary | ICD-10-CM | POA: Diagnosis not present

## 2016-01-22 DIAGNOSIS — K7460 Unspecified cirrhosis of liver: Secondary | ICD-10-CM | POA: Diagnosis not present

## 2016-01-22 NOTE — Progress Notes (Signed)
Quick Note:  US liver ok - no tumors Repeat 6 mos re: cirrhosis - limited RUQ ______

## 2016-01-23 ENCOUNTER — Ambulatory Visit (INDEPENDENT_AMBULATORY_CARE_PROVIDER_SITE_OTHER): Payer: Medicare Other | Admitting: Psychiatry

## 2016-01-23 ENCOUNTER — Encounter (HOSPITAL_COMMUNITY): Payer: Self-pay | Admitting: Psychiatry

## 2016-01-23 VITALS — BP 118/64 | HR 66 | Ht 62.0 in | Wt 206.0 lb

## 2016-01-23 DIAGNOSIS — M549 Dorsalgia, unspecified: Secondary | ICD-10-CM | POA: Diagnosis not present

## 2016-01-23 DIAGNOSIS — F411 Generalized anxiety disorder: Secondary | ICD-10-CM | POA: Diagnosis not present

## 2016-01-23 DIAGNOSIS — F0630 Mood disorder due to known physiological condition, unspecified: Secondary | ICD-10-CM | POA: Diagnosis not present

## 2016-01-23 DIAGNOSIS — F4001 Agoraphobia with panic disorder: Secondary | ICD-10-CM

## 2016-01-23 DIAGNOSIS — G8929 Other chronic pain: Secondary | ICD-10-CM

## 2016-01-23 DIAGNOSIS — F331 Major depressive disorder, recurrent, moderate: Secondary | ICD-10-CM | POA: Diagnosis not present

## 2016-01-23 MED ORDER — QUETIAPINE FUMARATE 25 MG PO TABS: 25.0000 mg | ORAL_TABLET | Freq: Every day | ORAL | Status: DC

## 2016-01-23 MED ORDER — ESCITALOPRAM OXALATE 10 MG PO TABS: 15.0000 mg | ORAL_TABLET | Freq: Every day | ORAL | Status: DC
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
## 2016-01-29 ENCOUNTER — Telehealth: Payer: Self-pay | Admitting: Family Medicine

## 2016-01-29 DIAGNOSIS — G8929 Other chronic pain: Secondary | ICD-10-CM

## 2016-01-29 DIAGNOSIS — M545 Low back pain: Principal | ICD-10-CM

## 2016-01-29 MED ORDER — MELOXICAM 7.5 MG PO TABS: 7.5000 mg | ORAL_TABLET | Freq: Every day | ORAL | Status: DC

## 2016-01-29 NOTE — Telephone Encounter (Signed)
Sent rx for Meloxicam (Mobic) 7.5mg  take one tablet daily to her pharmacy. She may take this one tablet every day (this is max dose, she should not take more than 1 tablet). Also do NOT take any motrin, ibuprofen, aleve, naproxen while taking this medication.  Could you please call and notify patient of this update.  [name], [place], PGY-3

## 2016-01-29 NOTE — Telephone Encounter (Signed)
Pt called and said that she was told by [facility] that she should try taking Mobic. This is non-narcotic and she would like to try this. jw

## 2016-01-30 ENCOUNTER — Encounter: Payer: Self-pay | Admitting: Pulmonary Disease

## 2016-01-30 ENCOUNTER — Ambulatory Visit (INDEPENDENT_AMBULATORY_CARE_PROVIDER_SITE_OTHER): Payer: Medicare Other | Admitting: Pulmonary Disease

## 2016-01-30 VITALS — BP 108/64 | HR 59 | Ht 62.0 in | Wt 209.4 lb

## 2016-01-30 DIAGNOSIS — G4719 Other hypersomnia: Secondary | ICD-10-CM

## 2016-01-30 DIAGNOSIS — G4733 Obstructive sleep apnea (adult) (pediatric): Secondary | ICD-10-CM

## 2016-01-30 NOTE — Telephone Encounter (Signed)
Pt informed. Deseree Blount, CMA  

## 2016-01-30 NOTE — Patient Instructions (Signed)
You have mild obstructive sleep apnea- avoid weight gain - you do not need cpap Your sleepiness may be related to medications - seroquel, klonopin, tramadol etc - Discuss with PCP about minimizing these  Keep your liver disease in check

## 2016-01-30 NOTE — Assessment & Plan Note (Signed)
sleepiness may be related to medications - seroquel, klonopin, tramadol etc - Discuss with PCP about minimizing these It may be also related to liver disease-continue lactulose for prevention of hepatic encephalopathy which can present with altered sleep with the  She also exhibits poor sleep hygiene and I went over some simple rules of sleep hygiene

## 2016-01-30 NOTE — Progress Notes (Signed)
Subjective:    Patient ID: Wendy [name], female    DOB: 1960-10-07, 55 y.o.   MRN: [id number]  HPI  Chief Complaint  Patient presents with  . Sleep Consult    Ref. by Dr. Josepha Pigg (primary MD)-partial sleep study 1 yr. ago at [facility].Unable to go to sleep and stay asleep. Used ?CPAP for 2 mths. only 1 yr. ago.Epworth score 106.   55 year old referred for evaluation of sleep-disordered breathing. She has a history of traumatic brain injury since 2006 and is being seen by psychiatry for chronic depression, anxiety and panic disorder. MRI from 08/2014 showed right frontal encephalomalacia. PSG in 05/2015 showed mild OSA with AHI of 6/hour-this was performed by [facility] neurology, she was then set up with CPAP machine, but this was discontinued-patient is very sketchy with the details partly due to her memory deficits from TBI and it is not clear to [place] why. Review of her PCP notes suggests that she had nonadherence and due to missed appointments she was discharged from [facility] neurology practice.  I also see mention of hepatitis C cirrhosis-but she does not have any history of hepatic encephalopathy, she is maintained on lactulose Review of her medications reveals numerous sedating medications such as Seroquel, Klonopin and pain medication such as tramadol and Flexeril  There is some mention of difficulty initiating and maintaining sleep with the patient denies this today. Epworth sleepiness score is 12 and she admits to several daytime naps Bedtime is around 10 PM but she gets into bed as early as 8 PM and watches TV until she falls asleep, the TV sometimes stays on through the night, she prefers to sleep on her back or on her side with 3 pillows, reports numerous nocturnal awakenings including nocturia and is out of bed at 9:51 AM feeling tired with occasional dryness of mouth but denies headaches. She admits to some weight gain but is unable to quantify  There is no history  suggestive of cataplexy, sleep paralysis or parasomnias    Past Medical History  Diagnosis Date  . Hepatic cirrhosis due to chronic hepatitis C infection ([place])     ETOH causative as well  . Hiatal hernia   . Esophageal stricture     esophageal dysmotility and chronic dysphagia as well  . 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 History of alcohol abuse   . Anxiety and depression   . History of hip fracture   . Allergic rhinitis   . HTN (hypertension)   . Blood transfusion   . Cataract     MILD  . GERD (gastroesophageal reflux disease)   . Osteoporosis   . Ulcer     2007  . Arthritis   . Clotting disorder ([place])     prolonged clotting time due to liver disease  . Paraesophageal hernia   . Compression fracture 09/21/2013  . 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 Paraesophageal hiatal hernia 10/17/2015    Past Surgical History  Procedure Laterality Date  . Orif hip fracture  2010    right x2  . Upper gastrointestinal endoscopy  08/05/2007    esophageal ring, hiatal hernia, portal gastropathy  . Splenectomy      age 19  . Burr hole for subdural hematoma  2004  . Upper gastrointestinal endoscopy  10/14/2011  . Colonoscopy  08/2012    moderatel left colon tics  . Kyphoplasty Bilateral 09/21/2013    Procedure: T12 - L1 KYPHOPLASTY;  Surgeon: Duane Lope  Rolena Infante, MD;  Location: [place];  Service: Orthopedics;  Laterality: Bilateral;  . Esophagogastroduodenoscopy (egd) with propofol N/A 01/26/2014    Procedure: ESOPHAGOGASTRODUODENOSCOPY (EGD) WITH PROPOFOL;  Surgeon: Lafayette Dragon, MD;  [facility];  Service: Endoscopy;  Laterality: N/A;  . Nasal sinus surgery      Allergies  Allergen Reactions  . Codeine Phosphate Itching  . Codeine Rash     Social History   Social History  . Marital Status: Widowed    Spouse Name: N/A  . Number of Children: 2  . Years of Education: 12+   Occupational History  . Disabled    Social History Main Topics    . Smoking status: Current Every Day Smoker -- 0.25 packs/day for 10 years    Types: Cigarettes    Last Attempt to Quit: 12/23/2008  . Smokeless tobacco: Never Used  . Alcohol Use: No     Comment: history of attending AA  . Drug Use: No  . Sexual Activity: No   Other Topics Concern  . Not on file   Social History Narrative   Just before her husband died, she had a miscarriage and started to drink beer heavily.  She quit secondary to her liver disease and has been quit for several years.  She denies the use of drugs - prescription or otherwise.     Lives with 2 room mates      Family History  Problem Relation Age of Onset  . Heart disease Mother   . Anxiety disorder Mother   . Colon cancer Neg Hx   . Other Daughter     chromosome abnormalit  . Other Daughter     micropthalmia  . Cancer Maternal Grandmother      Review of Systems neg for any significant sore throat, dysphagia, itching, sneezing, nasal congestion or excess/ purulent secretions, fever, chills, sweats, unintended wt loss, pleuritic or exertional cp, hempoptysis, orthopnea pnd or change in chronic leg swelling.  Also denies presyncope, palpitations, heartburn, abdominal pain, nausea, vomiting, diarrhea or change in bowel or urinary habits, dysuria,hematuria, rash, arthralgias, visual complaints, headache, numbness weakness or ataxia.     Objective:   Physical Exam  Gen. Pleasant, well-nourished, in no distress ENT - no lesions, no post nasal drip, Halting speech Neck: No JVD, no thyromegaly, no carotid bruits Lungs: no use of accessory muscles, no dullness to percussion, clear without rales or rhonchi  Cardiovascular: Rhythm regular, heart sounds  normal, no murmurs or gallops, no peripheral edema Musculoskeletal: No deformities, no cyanosis or clubbing        Assessment & Plan:

## 2016-01-30 NOTE — Assessment & Plan Note (Signed)
Her OSA is very mild and probably does not need CPAP therapy If she has further weight gain, this could certainly get worse in the future. Since. She was recently done I'm not sure that repeating a sleep study is necessary or would change management

## 2016-02-03 ENCOUNTER — Other Ambulatory Visit: Payer: Self-pay | Admitting: Family Medicine

## 2016-02-04 ENCOUNTER — Other Ambulatory Visit: Payer: Self-pay | Admitting: Family Medicine

## 2016-02-04 ENCOUNTER — Other Ambulatory Visit: Payer: Self-pay | Admitting: *Deleted

## 2016-02-04 MED ORDER — ALENDRONATE SODIUM 70 MG PO TABS: 70.0000 mg | ORAL_TABLET | ORAL | Status: DC

## 2016-02-06 ENCOUNTER — Ambulatory Visit (INDEPENDENT_AMBULATORY_CARE_PROVIDER_SITE_OTHER): Payer: Medicare Other | Admitting: Family Medicine

## 2016-02-06 VITALS — BP 118/75 | HR 61 | Temp 97.8°F | Ht 62.0 in | Wt 211.8 lb

## 2016-02-06 DIAGNOSIS — F331 Major depressive disorder, recurrent, moderate: Secondary | ICD-10-CM | POA: Diagnosis not present

## 2016-02-06 DIAGNOSIS — F4001 Agoraphobia with panic disorder: Secondary | ICD-10-CM | POA: Diagnosis not present

## 2016-02-06 DIAGNOSIS — R197 Diarrhea, unspecified: Secondary | ICD-10-CM

## 2016-02-06 DIAGNOSIS — Z114 Encounter for screening for human immunodeficiency virus [HIV]: Secondary | ICD-10-CM | POA: Diagnosis not present

## 2016-02-06 DIAGNOSIS — B182 Chronic viral hepatitis C: Secondary | ICD-10-CM

## 2016-02-06 DIAGNOSIS — E079 Disorder of thyroid, unspecified: Secondary | ICD-10-CM | POA: Diagnosis not present

## 2016-02-06 DIAGNOSIS — S0990XA Unspecified injury of head, initial encounter: Secondary | ICD-10-CM | POA: Diagnosis not present

## 2016-02-06 DIAGNOSIS — D649 Anemia, unspecified: Secondary | ICD-10-CM | POA: Diagnosis not present

## 2016-02-06 DIAGNOSIS — W19XXXD Unspecified fall, subsequent encounter: Secondary | ICD-10-CM

## 2016-02-06 DIAGNOSIS — D696 Thrombocytopenia, unspecified: Secondary | ICD-10-CM

## 2016-02-06 DIAGNOSIS — K745 Biliary cirrhosis, unspecified: Secondary | ICD-10-CM | POA: Diagnosis not present

## 2016-02-06 DIAGNOSIS — Z1159 Encounter for screening for other viral diseases: Secondary | ICD-10-CM | POA: Diagnosis not present

## 2016-02-06 DIAGNOSIS — I69911 Memory deficit following unspecified cerebrovascular disease: Secondary | ICD-10-CM

## 2016-02-06 DIAGNOSIS — Y92009 Unspecified place in unspecified non-institutional (private) residence as the place of occurrence of the external cause: Secondary | ICD-10-CM

## 2016-02-06 DIAGNOSIS — IMO0001 Reserved for inherently not codable concepts without codable children: Secondary | ICD-10-CM

## 2016-02-06 DIAGNOSIS — F0281 Dementia in other diseases classified elsewhere with behavioral disturbance: Secondary | ICD-10-CM

## 2016-02-06 DIAGNOSIS — F0280 Dementia in other diseases classified elsewhere without behavioral disturbance: Secondary | ICD-10-CM

## 2016-02-06 DIAGNOSIS — K7460 Unspecified cirrhosis of liver: Secondary | ICD-10-CM | POA: Diagnosis not present

## 2016-02-06 DIAGNOSIS — R413 Other amnesia: Secondary | ICD-10-CM | POA: Diagnosis not present

## 2016-02-06 LAB — FOLATE: Folate: >24 ng/mL (ref >5.4)

## 2016-02-06 LAB — TSH: TSH: 8.22 mIU/L — ABNORMAL HIGH

## 2016-02-06 LAB — VITAMIN B12

## 2016-02-06 NOTE — Patient Instructions (Signed)
[facility]  Address :[address] City : [place] : [place] Zip : [address] Website : [email or web address] Contact Email : [email or web address] Office Phone : [phone] Information Phone : [phone] Special Notes : [place] Office- [phone] [place] Office- [phone]  [facility] can provide information on local resources including:   Information and Referral (Senior InfoLine) providing seniors, family members, caregivers and others access to information and assistance for the elderly.  Home Delivered Meals (Meals on Wheels)  Senior Daycare Centers Seniors for those ages 60 and over are offered daytime supervised programs targeted to meet their social, educational, physical and recreational interests. A lunch is also served to those who meet the eligibility criteria.  Caregivers - Volunteer-based program designed to provide seniors with 1-3 hours of service per week. Services include friendly visiting, assistance with grocery shopping, errands, general transportation, etc.  Medical Transportation: Transportation to medical appointments for seniors ages 60 and over who do not receive Medicaid. Funds are occasionally available to transport disabled individuals under 60 who do not receive Medicaid.  Seniors' Health Insurance Information Program (S.H.I.I.P.): Volunteers are trained by the [place] Department of Insurance S.H.I.I.P. office, to provide free information, counseling and education to Medicare beneficiaries and their family members about: Medicare, Medicare Supplements, Long Term Care Insurance, and Medicare Prescription Drug Plans.  Nutritional Supplement Program: Ensure, Ensure Plus, and Glucerna products are sold at reduced prices. Simply fill out an application.    Alzheimer's Association The [place] Chapter is one of over 70 Alzheimer's Association chapters serving  communities across the [place]. 24/7 Helpline: 1.800.272.3900 Website at www.alz.org             Local chapters across the nation, providing services within each community.  Professionally staffed 24/7 Helpline (1.800.272.3900) offers information and advice to more than 300,000 callers each year and provides translation services in more than 200 languages.   Host face-to-face support groups and educational sessions in communities nationwide.   Connect people across the globe through our online message boards, ALZConnected. Our online community is ready to answer your questions and give you support.   Provide caregivers and families with comprehensive online resources and information through our Alzheimer's and Dementia Caregiver Center, which features sections on early-stage, middle-stage and late-stage caregiving.   Help people find clinical studies through our free, easy-to-use matching service Alzheimer's Association TrialMatch. TrialMatch connects individuals with Alzheimer's, caregivers, healthy volunteers and physicians with current studies.   Offer a free online tool, Alzheimer's Navigator, helps those facing the disease to determine their needs and develop an action plan, and our online Community Resource Finder is a comprehensive database of programs and service, housing and care services, and legal experts.   House the Alzheimer's Association Green-Field Library, the nation's largest library and resource center devoted to increasing knowledge about Alzheimer's disease and related dementias.  Provide safety services, Comfort Zone and MedicAlert + Alzheimer's Association Safe Return, provide location management for people with Alzheimer's who wander.   Our annual Walk to End Alzheimer's is the world's largest event to raise awareness and funds for Alzheimer's care, support and research.      

## 2016-02-07 ENCOUNTER — Encounter: Payer: Self-pay | Admitting: Family Medicine

## 2016-02-07 DIAGNOSIS — F0280 Dementia in other diseases classified elsewhere without behavioral disturbance: Secondary | ICD-10-CM

## 2016-02-07 DIAGNOSIS — S0990XS Unspecified injury of head, sequela: Secondary | ICD-10-CM | POA: Insufficient documentation

## 2016-02-07 HISTORY — DX: Dementia in other diseases classified elsewhere, unspecified severity, without behavioral disturbance, psychotic disturbance, mood disturbance, and anxiety: F02.80

## 2016-02-07 LAB — HIV ANTIBODY (ROUTINE TESTING W REFLEX): HIV 1&2 Ab, 4th Generation: NONREACTIVE

## 2016-02-07 LAB — RPR
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
## 2016-02-07 NOTE — Assessment & Plan Note (Addendum)
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   No evidence of delirium during evaluation.  Patient attentive, stayed on task except when relaying stories where she would tend to lose the narrative line.  While significant depressive symptoms, her affect did not match the depression symptoms responses. Patient listed psychiatric diagnoses is Panic DO with agoraphobia.  She is on benzodiazapine (Klonopin) and potent anticholinergic, hydroxyzine that can impair cognitive processing and memory formation.  It may benefit patient's cognition and memory to minimize of eliminate exposure to these medications.   A single moderate to severe TBI increases the risk of development of Alzheimer's dementia in TBI.  TBI may lower the age of onset of TBI-related neurocognitive syndromes.  Stage: Mild to moderate as patient has preservation of ADLs and partial ability to participate in iADLs.  memory loss, slow information processing, difficulty with abstract concepts and difficulty with complex reasoning decreased problem solving Palliative Performance Score: 60%.  Behavioral and Psychological Symptoms of Dementia: none Nonpharmacologic treatments of patient: none for dementia.    Requested [place] Social worker to contact Ms Gardino to see if a Case Manger could be assigned through her insurers to help her coordinate and access services that will help maintain Ms Bilotti in the community.

## 2016-02-09 ENCOUNTER — Other Ambulatory Visit: Payer: Self-pay | Admitting: Family Medicine

## 2016-02-11 ENCOUNTER — Telehealth: Payer: Self-pay | Admitting: Family Medicine

## 2016-02-11 NOTE — Telephone Encounter (Signed)
Pt lost all the paperwork given to her at her last visit with Dr McDiarmid.  She would like a copy of all the papers she was given because she hasnt read them yet.  Please advise

## 2016-02-11 NOTE — Telephone Encounter (Signed)
Left message on voicemail that paperwork has been left up front for pickup.

## 2016-02-12 ENCOUNTER — Encounter: Payer: Self-pay | Admitting: Licensed Clinical Social Worker

## 2016-02-12 ENCOUNTER — Telehealth: Payer: Self-pay | Admitting: Licensed Clinical Social Worker

## 2016-02-12 NOTE — Progress Notes (Signed)
Patient ID: ZI PLETT, female   DOB: Apr 05, 1961, 55 y.o.   MRN: [id number] CSW received referral from Dr. McDiarmid to assist patient with Care management services.  Per MD patient has been informed and is open to services.  Patient is anticipating a call.  [place] for [employer] [facility]) they are able to take the referral.  P4CC form completed and faxed.  MD notified.   Plan: CSW will follow up with [place]. LCSW Clinical Social Work, [place]   [phone] 11:25 AM

## 2016-02-12 NOTE — Telephone Encounter (Signed)
Call from [facility] with [facility], she received patient's referral form.  Doris has attempted to contact patient to schedule a home visit to assess patient's needs. She has not been able to talk with patient.    Plan: Tamela Oddi will continue to follow-up with patient to complete an assessment. Once the assessment is complete she will provide CSW and MD with an update.    Casimer Lanius. LCSW Clinical Social Work, [place]   [phone] 3:33 PM

## 2016-02-20 ENCOUNTER — Other Ambulatory Visit: Payer: Self-pay | Admitting: Family Medicine

## 2016-02-20 DIAGNOSIS — L299 Pruritus, unspecified: Secondary | ICD-10-CM

## 2016-02-27 ENCOUNTER — Ambulatory Visit (INDEPENDENT_AMBULATORY_CARE_PROVIDER_SITE_OTHER): Payer: Medicare Other | Admitting: Internal Medicine

## 2016-02-27 ENCOUNTER — Encounter: Payer: Self-pay | Admitting: Internal Medicine

## 2016-02-27 VITALS — BP 123/106 | HR 57 | Temp 98.3°F | Ht 62.0 in | Wt 206.2 lb

## 2016-02-27 DIAGNOSIS — F4001 Agoraphobia with panic disorder: Secondary | ICD-10-CM

## 2016-02-27 DIAGNOSIS — R351 Nocturia: Secondary | ICD-10-CM | POA: Diagnosis not present

## 2016-02-27 DIAGNOSIS — E039 Hypothyroidism, unspecified: Secondary | ICD-10-CM

## 2016-02-27 DIAGNOSIS — R350 Frequency of micturition: Secondary | ICD-10-CM

## 2016-02-27 DIAGNOSIS — R7309 Other abnormal glucose: Secondary | ICD-10-CM

## 2016-02-27 DIAGNOSIS — Z8639 Personal history of other endocrine, nutritional and metabolic disease: Secondary | ICD-10-CM | POA: Diagnosis not present

## 2016-02-27 LAB — POCT URINALYSIS DIPSTICK
GLUCOSE UA: NEGATIVE
Nitrite, UA: NEGATIVE
PH UA: 5.5
PROTEIN UA: NEGATIVE
RBC UA: NEGATIVE
SPEC GRAV UA: 1.02
UROBILINOGEN UA: 1

## 2016-02-27 LAB — POCT GLYCOSYLATED HEMOGLOBIN (HGB A1C): HEMOGLOBIN A1C: 4.9

## 2016-02-27 LAB — GLUCOSE, CAPILLARY: GLUCOSE-CAPILLARY: 103 mg/dL — AB (ref 65–99)

## 2016-02-27 MED ORDER — LEVOTHYROXINE SODIUM 100 MCG PO TABS: 100.0000 ug | ORAL_TABLET | Freq: Every day | ORAL | Status: DC

## 2016-02-27 NOTE — Patient Instructions (Addendum)
It was nice meeting you today Wendy [name]!  To prevent having to use the bathroom so often at night, please begin the exercises listed below. You can also continue to limit your fluid intake for 2-3 hours prior to bed like you have been. I will see you back in one month to make sure your symptoms are improving.   I have increased your dose of Synthroid (levothyroxine) to treat your low thyroid function. Please increase your dose to 100 mcg per day before breakfast.   If you have any questions or concerns, please feel free to call the clinic.   Be well,  Dr. Avon Gully  Kegel Exercises The goal of Kegel exercises is to isolate and exercise your pelvic floor muscles. These muscles act as a hammock that supports the rectum, vagina, small intestine, and uterus. As the muscles weaken, the hammock sags and these organs are displaced from their normal positions. Kegel exercises can strengthen your pelvic floor muscles and help you to improve bladder and bowel control, improve sexual response, and help reduce many problems and some discomfort during pregnancy. Kegel exercises can be done anywhere and at any time. HOW TO PERFORM KEGEL EXERCISES 1. Locate your pelvic floor muscles. To do this, squeeze (contract) the muscles that you use when you try to stop the flow of urine. You will feel a tightness in the vaginal area (women) and a tight lift in the rectal area (men and women). 2. When you begin, contract your pelvic muscles tight for 2-5 seconds, then relax them for 2-5 seconds. This is one set. Do 4-5 sets with a short pause in between. 3. Contract your pelvic muscles for 8-10 seconds, then relax them for 8-10 seconds. Do 4-5 sets. If you cannot contract your pelvic muscles for 8-10 seconds, try 5-7 seconds and work your way up to 8-10 seconds. Your goal is 4-5 sets of 10 contractions each day. Keep your stomach, buttocks, and legs relaxed during the exercises. Perform sets of both short and long  contractions. Vary your positions. Perform these contractions 3-4 times per day. Perform sets while you are:   Lying in bed in the morning.  Standing at lunch.  Sitting in the late afternoon.  Lying in bed at night. You should do 40-50 contractions per day. Do not perform more Kegel exercises per day than recommended. Overexercising can cause muscle fatigue. Continue these exercises for for at least 15-20 weeks or as directed by your caregiver.   This information is not intended to replace advice given to you by your health care provider. Make sure you discuss any questions you have with your health care provider.   Document Released: 07/27/2012 Document Revised: 08/31/2014 Document Reviewed: 07/27/2012 Elsevier Interactive Patient Education [employer].

## 2016-02-27 NOTE — Assessment & Plan Note (Signed)
Likely 2/2 urge incontinence. UA no suggestive of UTI with only small leuks. Hgb A1C 4.9, CBG 103, making DM less likely cause. Patient does not want to begin medication today, so will try Kegel exercises first.  - F/u in one month to assess improvement with Kegels - Continue fluid restriction - If no improvement at follow-up, consider beginning oxybutynin

## 2016-02-27 NOTE — Assessment & Plan Note (Signed)
Currently requiring one 1mg  tablet Klonopin ~5 days per week. Discussed weaning dose per Dr. Parks Ranger' plan, and patient became very apprehensive. Informed patient that at next visit we will be weaning the dose, and she was in agreement.  - Decrease Klonopin dose at next visit.

## 2016-02-27 NOTE — Progress Notes (Signed)
Subjective:    Patient ID: Wendy [name], female    DOB: 1961-08-14, 55 y.o.   MRN: [id number]  HPI  Patient presents for nocturia and anxiety.   Nocturia Patient reports nocturia for the past month. She is getting up about 8 times per night to urinate. She is usually unable to make it to the bathroom before urinating at least once per night. She urinates only small volumes, but does feel like she is fully emptying her bladder. Patient reports some dysuria but no hematuria. During the day she urinates about every 3 hours. Patient has tried to limit fluid intake for 2-3 hours before bed, but has seen no improvement in symptoms. She does drink caffeine with dinner (one soda), but this is >3 hours before bed. Patient does take Lasix, but takes it first thing in the morning. Denies fevers, abdominal pain, vaginal discharge.   Anxiety Patient has long-standing history of anxiety and panic disorder with agoraphobia. She is currently taking Klonopin once a day about 5 days a week. She takes the mediation when she has to leave her house, usually for medical appointments. She becomes especially anxious around crowds. She does not take Klonopin on days when she does not leave the house because her anxiety is more manageable, however she feels guilty for not leaving the house on these days because she knows she should be more active. She does not try anything else to control her anxiety, such as breathing exercises or relaxation techniques. Dr. Parks Ranger discussed weaning dose of Klonopin at last appointment, but patient is very apprehensive about doing this today, as she feels her symptoms will not be adequately controlled on a lower dose. She is currently taking 1 mg.   Hypothyroidism Last TSH was elevated. Patient reports taking Synthroid daily and is not missing doses. Denies symptoms of hypothyroidism.   Patient is current every day smoker.   Review of Systems See HPI.     Objective:   Physical Exam  Constitutional: She is oriented to person, place, and time. She appears well-developed and well-nourished. No distress.  Ambulating with walker  HENT:  Head: Normocephalic and atraumatic.  Neck: Normal range of motion. Neck supple. No thyromegaly present.  Pulmonary/Chest: Effort normal. No respiratory distress.  Abdominal: Soft. Bowel sounds are normal. She exhibits no distension. There is no tenderness.  Lymphadenopathy:    She has no cervical adenopathy.  Neurological: She is alert and oriented to person, place, and time.  Psychiatric: She has a normal mood and affect. Her behavior is normal.      Assessment & Plan:  Hypothyroidism Poorly controlled. Most recent TSH elevated at 8. Patient asymptomatic.  - Increase Synthroid to 13mcg with breakfast - Will continue to monitor at follow-up appointments  Nocturia Likely 2/2 urge incontinence. UA no suggestive of UTI with only small leuks. Hgb A1C 4.9, CBG 103, making DM less likely cause. Patient does not want to begin medication today, so will try Kegel exercises first.  - F/u in one month to assess improvement with Kegels - Continue fluid restriction - If no improvement at follow-up, consider beginning oxybutynin   PANIC DISORDER WITH AGORAPHOBIA Currently requiring one 1mg  tablet Klonopin ~5 days per week. Discussed weaning dose per Dr. Parks Ranger' plan, and patient became very apprehensive. Informed patient that at next visit we will be weaning the dose, and she was in agreement.  - Decrease Klonopin dose at next visit.    Adin Hector, MD, MPH PGY-2 [facility]  [facility] Pager [phone]

## 2016-02-27 NOTE — Assessment & Plan Note (Signed)
Poorly controlled. Most recent TSH elevated at 8. Patient asymptomatic.  - Increase Synthroid to 180mcg with breakfast - Will continue to monitor at follow-up appointments

## 2016-02-28 ENCOUNTER — Other Ambulatory Visit (HOSPITAL_COMMUNITY): Payer: Self-pay | Admitting: Psychiatry

## 2016-03-04 ENCOUNTER — Ambulatory Visit: Payer: Self-pay

## 2016-03-04 ENCOUNTER — Telehealth (HOSPITAL_COMMUNITY): Payer: Self-pay | Admitting: Psychiatry

## 2016-03-09 ENCOUNTER — Telehealth: Payer: Self-pay | Admitting: *Deleted

## 2016-03-09 NOTE — Telephone Encounter (Signed)
Pt called requesting a refill for Gabapentin 600mg . Return telephone call to pt. Informed pt Dr. De Nurse did not write the prescription, she will need to contact Dr. Mitzi Hansen [facility] for a refill. Informed pt her f/u appt is 7/21. Pt shows understanding.

## 2016-03-09 NOTE — Telephone Encounter (Signed)
Received fax from [place] requesting a refill for Seroquel. Medication refill was escribed to pharmacy on 01/23/16 w/ no refills. Per Dr. De Nurse, refill request is authorize for Seroquel 25mg , #30 w/ no refills. Refill was escribed to pharmacy. Pt is schedule for a f/u appt on 03/13/16.Called and informed pt of refill status. Pt verbalizes understanding.

## 2016-03-09 NOTE — Telephone Encounter (Signed)
Pt calling stating that the flexeril didn't work for her wants to know if something else can be called in. Yuchen Fedor Kennon Holter, CMA

## 2016-03-10 ENCOUNTER — Encounter: Payer: Self-pay | Admitting: Licensed Clinical Social Worker

## 2016-03-10 NOTE — Progress Notes (Signed)
Patient ID: Wendy [name], female   DOB: 01-30-1961, 55 y.o.   MRN: [id number]  Call from [facility] with [facility] in reference to coordination of care for patient.  Earlie Server continues to provide Care Coordination and supportive services for patient's ongoing needs and concerns.   Casimer Lanius, LCSW Licensed Clinical Social Worker [facility]   [phone] 10:32 AM

## 2016-03-11 ENCOUNTER — Telehealth: Payer: Self-pay | Admitting: *Deleted

## 2016-03-11 NOTE — Telephone Encounter (Signed)
Informed pt that you denied her flexeril request and she started yelling saying who is going to fill it. i told her that she has to go to the doctor who prescribed it and she says she doesn't know who that is. Please advise. Sharnise Blough Kennon Holter, CMA

## 2016-03-11 NOTE — Telephone Encounter (Signed)
Pt calling in again stating that it was karamalegos who prescribed it and since you are taking over, it should be you. Please advise. Kayde Warehime Kennon Holter, CMA

## 2016-03-12 NOTE — Telephone Encounter (Signed)
There is no record of patient receiving Flexeril from [place] as far back as 2012 (we do not have records prior to that, and even if we did, we cannot prescribe a medication to her that she hasn't received in 5 years without being seen first). As Dr. Parks Ranger or anyone at our office did not prescribe this medication, I will not refill it. She can schedule an appointment to be seen for whatever pain she's using Flexeril for, but I will not write for any new medications until then. Thank you! - AJL

## 2016-03-13 ENCOUNTER — Ambulatory Visit (INDEPENDENT_AMBULATORY_CARE_PROVIDER_SITE_OTHER): Payer: Medicare Other | Admitting: Psychiatry

## 2016-03-13 ENCOUNTER — Telehealth: Payer: Self-pay | Admitting: Internal Medicine

## 2016-03-13 ENCOUNTER — Encounter (HOSPITAL_COMMUNITY): Payer: Self-pay | Admitting: Psychiatry

## 2016-03-13 DIAGNOSIS — F0630 Mood disorder due to known physiological condition, unspecified: Secondary | ICD-10-CM

## 2016-03-13 DIAGNOSIS — M549 Dorsalgia, unspecified: Secondary | ICD-10-CM

## 2016-03-13 DIAGNOSIS — F411 Generalized anxiety disorder: Secondary | ICD-10-CM

## 2016-03-13 DIAGNOSIS — F331 Major depressive disorder, recurrent, moderate: Secondary | ICD-10-CM

## 2016-03-13 DIAGNOSIS — G8929 Other chronic pain: Secondary | ICD-10-CM

## 2016-03-13 MED ORDER — QUETIAPINE FUMARATE 25 MG PO TABS: 25.0000 mg | ORAL_TABLET | Freq: Every day | ORAL | Status: DC

## 2016-03-13 MED ORDER — ESCITALOPRAM OXALATE 10 MG PO TABS: 15.0000 mg | ORAL_TABLET | Freq: Every day | ORAL | Status: DC

## 2016-03-13 NOTE — Telephone Encounter (Signed)
Pt stated " something in crease of both legs, leaks clear fluid' and can't hardly walk with it. Tried "fungal foot stuff, is not working for me". Pt also stated she is losing hair "hand over fist". Pt asked for someone to call back with an answer and to leave a message if she doesn't answer. ep

## 2016-03-13 NOTE — Telephone Encounter (Signed)
LMVOM for pt to call us back. Per dr she needs to schedule an appt or go to th ED Delray Alt, CMA
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
## 2016-03-19 ENCOUNTER — Other Ambulatory Visit (HOSPITAL_COMMUNITY): Payer: Self-pay | Admitting: Psychiatry

## 2016-03-19 DIAGNOSIS — F331 Major depressive disorder, recurrent, moderate: Secondary | ICD-10-CM

## 2016-03-23 NOTE — Telephone Encounter (Signed)
Received fax from [facility] requesting refill for Lexapro 10mg . Per Dr. De Nurse, refill request is denied. Refill for Lexapro 10mg , #45 w/ 1 refill was escribed to pharmacy on 7/21. Pt is schedule for a f/u appt on 9/13. Called and informed pt of refill status. Pt verbalizes understanding.

## 2016-03-27 ENCOUNTER — Telehealth (HOSPITAL_COMMUNITY): Payer: Self-pay | Admitting: Psychiatry

## 2016-03-27 ENCOUNTER — Ambulatory Visit: Payer: Self-pay | Admitting: Internal Medicine

## 2016-03-27 NOTE — Telephone Encounter (Signed)
Pt phone into office requesting a refill for Flexeril. LVM for pt to return call to office.

## 2016-04-06 ENCOUNTER — Other Ambulatory Visit: Payer: Self-pay | Admitting: Family Medicine

## 2016-04-06 ENCOUNTER — Telehealth: Payer: Self-pay | Admitting: Internal Medicine

## 2016-04-06 DIAGNOSIS — M545 Low back pain: Principal | ICD-10-CM

## 2016-04-06 DIAGNOSIS — G8929 Other chronic pain: Secondary | ICD-10-CM

## 2016-04-06 NOTE — Telephone Encounter (Signed)
Pt is calling to check the status of paperwork that was faxed from her oral surgeon. They need it signed and faxed back. If you never received the forms please call patient so that she can have more faxed over. jw

## 2016-04-07 ENCOUNTER — Other Ambulatory Visit: Payer: Self-pay | Admitting: Family Medicine

## 2016-04-07 ENCOUNTER — Telehealth: Payer: Self-pay | Admitting: Pulmonary Disease

## 2016-04-07 ENCOUNTER — Ambulatory Visit: Payer: Self-pay | Admitting: *Deleted

## 2016-04-07 ENCOUNTER — Ambulatory Visit: Payer: Self-pay | Admitting: Internal Medicine

## 2016-04-07 DIAGNOSIS — L299 Pruritus, unspecified: Secondary | ICD-10-CM

## 2016-04-07 DIAGNOSIS — D696 Thrombocytopenia, unspecified: Secondary | ICD-10-CM

## 2016-04-07 DIAGNOSIS — J309 Allergic rhinitis, unspecified: Secondary | ICD-10-CM

## 2016-04-07 DIAGNOSIS — R0600 Dyspnea, unspecified: Secondary | ICD-10-CM

## 2016-04-07 NOTE — Telephone Encounter (Signed)
Received fax from [facility] requesting Surgery Clearance for Teeth Extraction surgery.    Per form, patient needs to have some labwork done.  Called patient to inform her that she will need to have some labs done for her surgical clearance. Labs have been ordered.  Patient just needs to go to the lab downstairs to get the labs drawn. Will need to fast for labs.  Left message for patient to call back.

## 2016-04-08 NOTE — Telephone Encounter (Signed)
Left message on voicemail for patient to have oral surgeon refax forms.

## 2016-04-08 NOTE — Telephone Encounter (Signed)
Called spoke with pt. Aware of below. She will the labs done. Nothing further needed

## 2016-04-08 NOTE — Telephone Encounter (Signed)
Patient returned call, CB is [phone].

## 2016-04-16 ENCOUNTER — Telehealth: Payer: Self-pay | Admitting: Pulmonary Disease

## 2016-04-16 NOTE — Telephone Encounter (Signed)
Spoke with pt and she advised that she has been unable to have labs drawn due to other health issues. She plans to have them drawn tomorrow or Monday. Advised her that we should have results by middle of next week and we will call her then.  [place]. Thanks!

## 2016-04-16 NOTE — Telephone Encounter (Signed)
[id number] x1 

## 2016-04-16 NOTE — Telephone Encounter (Signed)
Patient returning Michelle's call - She can be reached at [phone]

## 2016-04-16 NOTE — Telephone Encounter (Signed)
Called to check on status of blood work, did not see results in chart, wanted to find out when patient will be getting it drawn so I can fax her paperwork for Surgical Clearance.  Paperwork is in my notebook awaiting labs to be drawn.  ------------- Left message with female that answered phone for patient to call back.

## 2016-04-16 NOTE — Telephone Encounter (Signed)
Pt returning call.Wendy [name] ° °

## 2016-04-20 ENCOUNTER — Ambulatory Visit: Payer: Self-pay | Admitting: Internal Medicine

## 2016-04-23 ENCOUNTER — Encounter: Payer: Self-pay | Admitting: Internal Medicine

## 2016-04-23 ENCOUNTER — Other Ambulatory Visit (INDEPENDENT_AMBULATORY_CARE_PROVIDER_SITE_OTHER): Payer: Medicare Other

## 2016-04-23 ENCOUNTER — Ambulatory Visit (INDEPENDENT_AMBULATORY_CARE_PROVIDER_SITE_OTHER): Payer: Medicare Other | Admitting: Internal Medicine

## 2016-04-23 ENCOUNTER — Other Ambulatory Visit: Payer: Medicare Other

## 2016-04-23 VITALS — BP 146/84 | HR 66 | Ht 62.0 in | Wt 213.0 lb

## 2016-04-23 DIAGNOSIS — D696 Thrombocytopenia, unspecified: Secondary | ICD-10-CM | POA: Diagnosis not present

## 2016-04-23 DIAGNOSIS — K449 Diaphragmatic hernia without obstruction or gangrene: Secondary | ICD-10-CM

## 2016-04-23 DIAGNOSIS — K7290 Hepatic failure, unspecified without coma: Secondary | ICD-10-CM

## 2016-04-23 DIAGNOSIS — R0600 Dyspnea, unspecified: Secondary | ICD-10-CM | POA: Diagnosis not present

## 2016-04-23 DIAGNOSIS — R140 Abdominal distension (gaseous): Secondary | ICD-10-CM | POA: Diagnosis not present

## 2016-04-23 DIAGNOSIS — K7682 Hepatic encephalopathy: Secondary | ICD-10-CM

## 2016-04-23 DIAGNOSIS — K7460 Unspecified cirrhosis of liver: Secondary | ICD-10-CM | POA: Diagnosis not present

## 2016-04-23 LAB — COMPREHENSIVE METABOLIC PANEL
ALT: 14 U/L (ref 0–35)
AST: 32 U/L (ref 0–37)
Albumin: 2.5 g/dL — ABNORMAL LOW (ref 3.5–5.2)
Alkaline Phosphatase: 109 U/L (ref 39–117)
BILIRUBIN TOTAL: 1 mg/dL (ref 0.2–1.2)
BUN: 14 mg/dL (ref 6–23)
CALCIUM: 8.3 mg/dL — AB (ref 8.4–10.5)
CHLORIDE: 105 meq/L (ref 96–112)
CO2: 28 meq/L (ref 19–32)
CREATININE: 0.98 mg/dL (ref 0.40–1.20)
GFR: 62.32 mL/min (ref >60.00)
GLUCOSE: 118 mg/dL — AB (ref 70–99)
Potassium: 4.4 mEq/L (ref 3.5–5.1)
SODIUM: 136 meq/L (ref 135–145)
Total Protein: 7.3 g/dL (ref 6.0–8.3)

## 2016-04-23 LAB — CBC WITH DIFFERENTIAL/PLATELET
BASOS ABS: 0 10*3/uL (ref 0.0–0.1)
BASOS PCT: 0.4 % (ref 0.0–3.0)
EOS ABS: 0.2 10*3/uL (ref 0.0–0.7)
Eosinophils Relative: 2 % (ref 0.0–5.0)
HCT: 36.8 % (ref 36.0–46.0)
Hemoglobin: 12.1 g/dL (ref 12.0–15.0)
LYMPHS ABS: 1.6 10*3/uL (ref 0.7–4.0)
LYMPHS PCT: 19.5 % (ref 12.0–46.0)
MCHC: 33 g/dL (ref 30.0–36.0)
MCV: 117 fl — ABNORMAL HIGH (ref 78.0–100.0)
MONO ABS: 1.1 10*3/uL — AB (ref 0.1–1.0)
Monocytes Relative: 13.5 % — ABNORMAL HIGH (ref 3.0–12.0)
NEUTROS ABS: 5.4 10*3/uL (ref 1.4–7.7)
NEUTROS PCT: 64.6 % (ref 43.0–77.0)
PLATELETS: 209 10*3/uL (ref 150.0–400.0)
RBC: 3.14 Mil/uL — ABNORMAL LOW (ref 3.87–5.11)
RDW: 15.7 % — AB (ref 11.5–15.5)
WBC: 8.4 10*3/uL (ref 4.0–10.5)

## 2016-04-23 LAB — PROTIME-INR
INR: 1.5 ratio — ABNORMAL HIGH (ref 0.8–1.0)
Prothrombin Time: 15.6 s — ABNORMAL HIGH (ref 9.6–13.1)

## 2016-04-23 LAB — AMMONIA: Ammonia: 95 umol/L — ABNORMAL HIGH (ref 11–35)

## 2016-04-23 MED ORDER — ALUM HYDROXIDE-MAG CARBONATE 95-358 MG/15ML PO SUSP: 30.0000 mL | Freq: Every day | ORAL | 0 refills | Status: DC

## 2016-04-23 NOTE — Patient Instructions (Addendum)
  Your physician has requested that you go to the basement for the following lab work before leaving today: Ammonia, AFP   You have been scheduled for an abdominal ultrasound and upper GI series at [facility] Radiology (1st floor of hospital) on 05/04/16 at 9:00AM. Please arrive 15 minutes prior to your appointment for registration. Make certain not to have anything to eat or drink 6 hours prior to your appointment. Should you need to reschedule your appointment, please contact radiology at [phone]. This test typically takes about 30 minutes to perform.   Take 2 tablespoons of Gaviscon daily at bedtime.    Put 4 inch blocks under the head of your bed.    I appreciate the opportunity to care for you. Silvano Rusk, MD, [facility]

## 2016-04-23 NOTE — Progress Notes (Signed)
Assessment & Plan:   Encounter Diagnoses  Name Primary?  . Paraesophageal hiatal hernia   . Hepatic cirrhosis, unspecified hepatic cirrhosis type ([place])   . 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 Her cirrhosis seems stable but if ascites worse could be aggravating her reflux sxs. She seems very clear today. Stay on carvedilol for varices  Plans:  1) Abd [place] - complete - assess for ascites better 2) Upper GI series assess hiatal hernia again 3) Elevate HOB 4) Gaviscon 30 cc qhs 5) Labs CNBC, CMET, INR, AFP and NH3 today  Will determine f/u after complete ? If she could have surgical repair - it would be big surgery - she is a Child's B 9 points so higher risk for sure  Same issues for hip surgery though would expect she could tolerate better than abd surgery  ? If could anchor stomach w/ PEG and fix hiatal hernia  **Needs Prevnar vaccine - will let her know  Subjective:    Patient ID: Wendy [name], female    DOB: 02/19/1961, 55 y.o.   MRN: [id number] Cc: worsening reflux, bloating HPI Having more problems with reflux - especially at night having regurgitation of hot burning fluid and sonme food debris. No overt dysphagia. Taking bid PPI. Feels bloated and distended.  HCV is cleared after Harvoni.  No edema Denies new confusion or sleepiness   Medications, allergies, past medical history, past surgical history, family history and social history are reviewed and updated in the EMR.  Review of Systems R>L hip pain - using a walker, has been unable to have surgery due to liver dz concerns of ortho MD    Objective:   Physical Exam BP (!) 146/84   Pulse 66   Ht 5\' 2"  (1.575 m)   Wt 213 lb (96.6 kg)   BMI 38.96 kg/m  NAD Anicteric Lungs cta Cor s1 s2 abd obese - soft NY - fullness RUQ, upper midline splenectomy scar Ext no  edema  Alert and oriented x 3 no asterixis  Data reviewed: 01/22/16 [place] abd - cirrhosis, no liver lesions, small perihepatic ascites  Ba swallow 02/02/14 IMPRESSION: 1. New since 2013 moderate size gastric hiatal hernia, paraesophageal type, exerting mass effect on the distal esophagus and contributing to poor clearance of barium from the esophagus throughout the study. 2. Extensive, almost constant tertiary contractions in the distal thoracic esophagus, sometimes bordering on a "corkscrew esophagus" type appearance. 3. No definite esophageal varices  .  Current Outpatient Prescriptions:  .  alendronate (FOSAMAX) 70 MG tablet, Take 1 tablet (70 mg total) by mouth once a week., Disp: 12 tablet, Rfl: 0 .  Biotin 1000 MCG tablet, Take 1,000 mcg by mouth 3 (three) times daily., Disp: , Rfl:  .  bisacodyl ([employer] GENTLE LAXATIVE) 5 MG EC tablet, TAKE 1 TABLET (5 MG TOTAL) BY MOUTH DAILY. (Patient taking differently: daily as needed. TAKE 1 TABLET (5 MG TOTAL) BY MOUTH DAILY.), Disp: 30 tablet, Rfl: 2 .  carvedilol (COREG) 12.5 MG tablet, Take 1 tablet (12.5 mg total) by mouth 2 (two) times daily with a meal., Disp: 60 tablet, Rfl: 5 .  Cholecalciferol (VITAMIN D3) 2000 UNITS capsule, Take 2,000 Units by mouth daily., Disp: , Rfl:  .  clonazePAM (KLONOPIN) 1 MG tablet, Take 0.5-1 tablets (0.5-1 mg total) by mouth 2 (  two) times daily as needed for anxiety., Disp: 45 tablet, Rfl: 2 .  cycloSPORINE (RESTASIS) 0.05 % ophthalmic emulsion, Place 1 drop into both eyes 2 (two) times daily. , Disp: , Rfl:  .  docusate sodium (COLACE) 100 MG capsule, TAKE ONE CAPSULE BY MOUTH TWICE A DAY, Disp: 60 capsule, Rfl: 2 .  escitalopram (LEXAPRO) 10 MG tablet, Take 1.5 tablets (15 mg total) by mouth daily., Disp: 45 tablet, Rfl: 1 .  furosemide (LASIX) 40 MG tablet, TAKE 1/2 TABLET EVERY DAY, Disp: 30 tablet, Rfl: 3 .  gabapentin (NEURONTIN) 600 MG tablet, Take 1 tablet (600 mg total) by mouth 3 (three) times  daily., Disp: 3 tablet, Rfl: 1 .  hydrOXYzine (ATARAX/VISTARIL) 25 MG tablet, TAKE 1 TO 2 TABLETS BY MOUTH TWICE A DAY AS NEEDED FOR ITCHING, Disp: 90 tablet, Rfl: 0 .  lactulose (CHRONULAC) 10 GM/15ML solution, Take 45 mLs (30 g total) by mouth 4 (four) times daily as needed (for constipation)., Disp: 960 mL, Rfl: 6 .  levothyroxine (SYNTHROID, LEVOTHROID) 100 MCG tablet, Take 1 tablet (100 mcg total) by mouth daily before breakfast., Disp: 90 tablet, Rfl: 0 .  loratadine (CLARITIN) 10 MG tablet, TAKE 1 TABLET (10 MG TOTAL) BY MOUTH DAILY., Disp: 30 tablet, Rfl: 6 .  meloxicam (MOBIC) 7.5 MG tablet, TAKE 1 TABLET EVERY DAY, Disp: 30 tablet, Rfl: 1 .  Multiple Vitamins-Minerals (CENTRUM SILVER ULTRA WOMENS PO), Take by mouth., Disp: , Rfl:  .  Olopatadine HCl 0.2 % SOLN, Place 1 drop into both eyes every morning. , Disp: , Rfl:  .  omeprazole (PRILOSEC) 20 MG capsule, TAKE 1 CAPSULE (20 MG TOTAL) BY MOUTH DAILY., Disp: 30 capsule, Rfl: 5 .  QUEtiapine (SEROQUEL) 25 MG tablet, Take 1 tablet (25 mg total) by mouth at bedtime., Disp: 30 tablet, Rfl: 1 .  rifaximin (XIFAXAN) 550 MG TABS tablet, Take 1 tablet (550 mg total) by mouth 2 (two) times daily., Disp: 60 tablet, Rfl: 2 .  spironolactone (ALDACTONE) 100 MG tablet, TAKE 1 TABLET EVERY DAY, Disp: 30 tablet, Rfl: 5 .  traMADol (ULTRAM) 50 MG tablet, Take 1-2 tablets (50-100 mg total) by mouth every 8 (eight) hours as needed (For pain.)., Disp: 180 tablet, Rfl: 2 .  traZODone (DESYREL) 50 MG tablet, TAKE 1/2-1 TABLET BY MOUTH AT BEDTIME AS NEEDED FOR SLEEP, Disp: 30 tablet, Rfl: 5 .  aluminum hydroxide-magnesium carbonate (GAVISCON) 95-358 MG/15ML SUSP, Take 30 mLs by mouth at bedtime., Disp: , Rfl: 0    I have reviewed 09/2015 [facility] note   Cc: Arna Snipe, MD Adin Hector, MD

## 2016-04-23 NOTE — Progress Notes (Signed)
Ammonia is quite high She was clear in clinic (mental status) Please ask her how much if any lactulose she takes and is she taking Xifaxan?

## 2016-04-23 NOTE — Assessment & Plan Note (Addendum)
Seems clear NH3 today

## 2016-04-23 NOTE — Assessment & Plan Note (Signed)
Sxic Surgery ??? Candidate Also s/p spelnectomy Check ascites Liver impact?

## 2016-04-23 NOTE — Assessment & Plan Note (Signed)
AFP, NH3

## 2016-04-24 LAB — AFP TUMOR MARKER: AFP TUMOR MARKER: 7.7 ng/mL — AB (ref <6.1)

## 2016-04-24 NOTE — Telephone Encounter (Signed)
No reason I see she could not have surgery though her INR is sltly up and labs need to be communicated to her surgeon so he knows the risk and can determine if anything else needed

## 2016-04-24 NOTE — Telephone Encounter (Signed)
Forms have been completed and faxed.  Patient notified

## 2016-04-24 NOTE — Telephone Encounter (Signed)
Pt needs to contact Dr Celesta Aver office for lab results as he resulted them and had specific questions he needed answered. In the meantime I will send this message to Dr Melvyn Novas (DOD) to advise on results and also to see if he can give Pulm Clearance. Sharyn Lull has a form that was filled out by Dr Elsworth Soho stating that surgery clearance will be based on lab results. Pt is having multiple tooth extraction surgery in the near future. Please advise Dr Melvyn Novas. Thanks.

## 2016-04-24 NOTE — Telephone Encounter (Signed)
Called spoke with pt. Reviewed MW's recs. Pt's surgeon is located in [place] and is Dr. Debbora Presto # [phone]. I explained to her that I would send message to Sharyn Lull to make her aware. She voiced understanding and had no further questions.   Will forward message to [place] as [place].

## 2016-04-24 NOTE — Telephone Encounter (Signed)
Patient returning Michelle's call -she can be reached [phone]

## 2016-04-25 ENCOUNTER — Encounter: Payer: Self-pay | Admitting: Internal Medicine

## 2016-04-28 ENCOUNTER — Telehealth: Payer: Self-pay

## 2016-04-28 ENCOUNTER — Ambulatory Visit (INDEPENDENT_AMBULATORY_CARE_PROVIDER_SITE_OTHER): Payer: Medicare Other | Admitting: Internal Medicine

## 2016-04-28 ENCOUNTER — Other Ambulatory Visit: Payer: Self-pay

## 2016-04-28 ENCOUNTER — Other Ambulatory Visit: Payer: Self-pay | Admitting: *Deleted

## 2016-04-28 ENCOUNTER — Telehealth: Payer: Self-pay | Admitting: Internal Medicine

## 2016-04-28 ENCOUNTER — Encounter: Payer: Self-pay | Admitting: Internal Medicine

## 2016-04-28 ENCOUNTER — Ambulatory Visit: Payer: Self-pay | Admitting: Neurology

## 2016-04-28 ENCOUNTER — Other Ambulatory Visit: Payer: Self-pay | Admitting: Internal Medicine

## 2016-04-28 VITALS — BP 116/84 | Temp 98.2°F | Wt 215.0 lb

## 2016-04-28 DIAGNOSIS — G8929 Other chronic pain: Secondary | ICD-10-CM | POA: Diagnosis not present

## 2016-04-28 DIAGNOSIS — H5789 Other specified disorders of eye and adnexa: Secondary | ICD-10-CM

## 2016-04-28 DIAGNOSIS — R0981 Nasal congestion: Secondary | ICD-10-CM

## 2016-04-28 DIAGNOSIS — M549 Dorsalgia, unspecified: Secondary | ICD-10-CM

## 2016-04-28 DIAGNOSIS — Z23 Encounter for immunization: Secondary | ICD-10-CM

## 2016-04-28 DIAGNOSIS — L989 Disorder of the skin and subcutaneous tissue, unspecified: Secondary | ICD-10-CM | POA: Diagnosis not present

## 2016-04-28 DIAGNOSIS — H578 Other specified disorders of eye and adnexa: Secondary | ICD-10-CM | POA: Diagnosis not present

## 2016-04-28 DIAGNOSIS — L299 Pruritus, unspecified: Secondary | ICD-10-CM | POA: Diagnosis not present

## 2016-04-28 DIAGNOSIS — H04123 Dry eye syndrome of bilateral lacrimal glands: Secondary | ICD-10-CM | POA: Diagnosis not present

## 2016-04-28 DIAGNOSIS — K7682 Hepatic encephalopathy: Secondary | ICD-10-CM

## 2016-04-28 DIAGNOSIS — K7290 Hepatic failure, unspecified without coma: Secondary | ICD-10-CM

## 2016-04-28 DIAGNOSIS — Z0000 Encounter for general adult medical examination without abnormal findings: Secondary | ICD-10-CM

## 2016-04-28 MED ORDER — SPIRONOLACTONE 100 MG PO TABS: 100.0000 mg | ORAL_TABLET | Freq: Every day | ORAL | 5 refills | Status: DC

## 2016-04-28 MED ORDER — FLUTICASONE PROPIONATE 50 MCG/ACT NA SUSP: 2.0000 | Freq: Every day | NASAL | 6 refills | Status: DC

## 2016-04-28 MED ORDER — OLOPATADINE HCL 0.2 % OP SOLN: 1.0000 [drp] | Freq: Every morning | OPHTHALMIC | 0 refills | Status: DC

## 2016-04-28 MED ORDER — CYCLOSPORINE 0.05 % OP EMUL: 1.0000 [drp] | Freq: Two times a day (BID) | OPHTHALMIC | 0 refills | Status: AC

## 2016-04-28 MED ORDER — LACTULOSE 10 GM/15ML PO SOLN: ORAL | 6 refills | Status: DC

## 2016-04-28 MED ORDER — HYDROXYZINE HCL 25 MG PO TABS: ORAL_TABLET | ORAL | 0 refills | Status: DC

## 2016-04-28 NOTE — Telephone Encounter (Signed)
   Spoke with Wendy [name] and she was heading out the door to see her PCP, she wrote down  Prevnar 13 and will ask them about it.

## 2016-04-28 NOTE — Progress Notes (Signed)
[facility] Progress Note  Subjective:  Carlisa Cuadros is a 55-y/o female with history of dementia s/p TBI in 2004, hepatic cirrhosis, HTN, and anxiety with agoraphobia. She presents with various complaints, including itching skin and discharge of eyes.  Eye discharge: - Present intermittently for years - Bothered by having stringy discharge that she has to wipe away  - No itching or discomfort at present - Uses restasis and pataday drops with relief  - Says she has eye doctor appointment tomorrow to discuss this further ROS: Denies blurry vision, eye pain  Nasal congestion: - Feels that she can't breathe well out of her nose - Claritin not helping much - No sinus pressure - Wondering if she should see allergist  ROS: No rhinorrhea or cough  Skin lesion on R temple: - Small bump had been present for years; feels that site has grown - Does not cause pain or itching - Says she was told to seek further evaluation if area grew - No bleeding of lesion  Itching skin: - Takes hydroxyzine and needs refill - Would like to eventually discontinue hydroxyzine because it is expensive for her - Denies any rashes - Does not use lotion or moisturize   Back stiffness: - Chronic. Had previously been on a muscle relaxant and wondering if she should restart this. - Takes klonopin for anxiety once daily. - Tramadol helps "some"  Health Maintenance: - Due for flu vaccine, TDAP, and pap smear - Needs Prevnar per GI  Social: Former smoker  Objective: Blood pressure 116/84, temperature 98.2 F (36.8 C), temperature source Axillary, weight 215 lb (97.5 kg). Constitutional: Chronically ill-appearing, obese, in wheelchair, NAD. Pleasant. HENT: No erythema or swelling of nasal turbinates. Narrow nares.  Eyes: Conjunctiva clear. No obvious discharge. EOMI. Pulmonary/Chest: Effort normal and breath sounds normal. No respiratory distress.  Skin: Dry, flaking skin especially  across forearms. Dry, flesh-colored papule above R temple.  Psychiatric: Somewhat tangential speech.   Vitals reviewed  Assessment/Plan: Eye discharge - None present today. - To be evaluated by eye doctor tomorrow per patient preference - Refilled pataday drops and restasis, as have provided relief.   Nasal congestion - Chronic and with history of allergic rhinitis but no swelling or erythema of nasal turbinates today. - Recommended trying flonase or nasal saline. Would defer referral to allergist at this time unless patient decides she wants second opinion after trying nasal sprays.   Skin lesion - Small lesion at R temple, growing per patient. Possible seborrheic dermatosis vs. early squamous cell lesion - Recommend biopsy at derm clinic  Itching - Suspect 2/2 very dry skin. - Recommended thick, emollient creams like eucerin vs vaseline spray - Refilled hydroxyzine  Chronic back pain - Initially requesting muscle relaxant but after discussing safety concerns of taking muscle relaxant with klonopin as well as lexapro and seroquel prefers to continue klonopin and tramadol at this time. Denies need for refills. - Not interested in weaning down klonopin at this time, as thinks not using very much anyway  Health care maintenance - Flu and TDAP administered - Out of prevnar-13. Clinic to call patient when refills in. - Pap smear deferred per patient preference until another visit.   Follow-up at earliest convenience for pap smear and derm clinic appointment.  Olene Floss, MD [place], PGY-2

## 2016-04-28 NOTE — Progress Notes (Signed)
Ask her to take 2 tablespoons of lactulose bid

## 2016-04-28 NOTE — Telephone Encounter (Signed)
-----   Message from Gatha Mayer, MD sent at 04/25/2016  4:24 PM EDT ----- Regarding: needs prevnar She needs Preevnar vaccine please arrange or have her get through PCP

## 2016-04-28 NOTE — Telephone Encounter (Signed)
See lab results for additional details.  

## 2016-04-28 NOTE — Patient Instructions (Addendum)
Ms. Reum,  Please make an appointment with our dermatology clinic at your earliest convenience for lesion on your right temple.   Please make an appointment for pap smear at your earliest convenience.  I have refilled your eye drops and your hydroxyzine. I recommend intense moisturizing with emollient lotions like eucerin cream and cerave. There is also a vaseline spray that is easy to use.  You received flu, TDAP, and prevnar vaccines today.  I prescribed flonase for nasal congestion. If you want to be referred to an allergist for a second opinion, please call clinic.  Best, Dr. Ola Spurr

## 2016-04-29 DIAGNOSIS — R0981 Nasal congestion: Secondary | ICD-10-CM | POA: Insufficient documentation

## 2016-04-29 DIAGNOSIS — L299 Pruritus, unspecified: Secondary | ICD-10-CM | POA: Insufficient documentation

## 2016-04-29 DIAGNOSIS — H5789 Other specified disorders of eye and adnexa: Secondary | ICD-10-CM | POA: Insufficient documentation

## 2016-04-29 DIAGNOSIS — Z0000 Encounter for general adult medical examination without abnormal findings: Secondary | ICD-10-CM | POA: Insufficient documentation

## 2016-04-29 NOTE — Assessment & Plan Note (Signed)
-   Initially requesting muscle relaxant but after discussing safety concerns of taking muscle relaxant with klonopin as well as lexapro and seroquel prefers to continue klonopin and tramadol at this time. Denies need for refills. - Not interested in weaning down klonopin at this time, as thinks not using very much anyway

## 2016-04-29 NOTE — Assessment & Plan Note (Signed)
-   Suspect 2/2 very dry skin. - Recommended thick, emollient creams like eucerin vs vaseline spray - Refilled hydroxyzine

## 2016-04-29 NOTE — Assessment & Plan Note (Signed)
-   Chronic and with history of allergic rhinitis but no swelling or erythema of nasal turbinates today. - Recommended trying flonase or nasal saline. Would defer referral to allergist at this time unless patient decides she wants second opinion after trying nasal sprays.

## 2016-04-29 NOTE — Assessment & Plan Note (Signed)
-   Small lesion at R temple, growing per patient. Possible seborrheic dermatosis vs. early squamous cell lesion - Recommend biopsy at derm clinic

## 2016-04-29 NOTE — Assessment & Plan Note (Signed)
-   None present today. - To be evaluated by eye doctor tomorrow per patient preference - Refilled pataday drops and restasis, as have provided relief.

## 2016-04-29 NOTE — Assessment & Plan Note (Signed)
-   Flu and TDAP administered - Out of prevnar-13. Clinic to call patient when refills in. - Pap smear deferred per patient preference until another visit.

## 2016-04-30 ENCOUNTER — Telehealth: Payer: Self-pay | Admitting: *Deleted

## 2016-04-30 DIAGNOSIS — H1045 Other chronic allergic conjunctivitis: Secondary | ICD-10-CM | POA: Diagnosis not present

## 2016-04-30 DIAGNOSIS — R6889 Other general symptoms and signs: Secondary | ICD-10-CM

## 2016-04-30 MED ORDER — OLOPATADINE HCL 0.7 % OP SOLN: 1.0000 [drp] | Freq: Every day | OPHTHALMIC | 1 refills | Status: DC

## 2016-04-30 NOTE — Telephone Encounter (Signed)
Olopatadine prescribed at visit on 9/5 is not covered by patient insurance. Pharmacy states that cromolyn, azelastine, and Pazeon are on formulary, suggests MD try one of these.

## 2016-04-30 NOTE — Telephone Encounter (Signed)
Patient was to be seen by eye doctor yesterday, and different recommendations may have been given. However, switched order to pazeo drops in case patient wanted refill.

## 2016-05-01 ENCOUNTER — Telehealth: Payer: Self-pay | Admitting: Internal Medicine

## 2016-05-01 ENCOUNTER — Other Ambulatory Visit: Payer: Self-pay | Admitting: *Deleted

## 2016-05-01 ENCOUNTER — Ambulatory Visit: Payer: Self-pay | Admitting: Internal Medicine

## 2016-05-01 DIAGNOSIS — G8929 Other chronic pain: Secondary | ICD-10-CM

## 2016-05-01 DIAGNOSIS — M549 Dorsalgia, unspecified: Principal | ICD-10-CM

## 2016-05-01 MED ORDER — TRAMADOL HCL 50 MG PO TABS: 50.0000 mg | ORAL_TABLET | Freq: Three times a day (TID) | ORAL | 2 refills | Status: DC | PRN

## 2016-05-01 NOTE — Telephone Encounter (Signed)
Pt was seen at eye dr yesterday but was given some steriods for eye.  She wants to make sure it is ok to take these. Please advise

## 2016-05-01 NOTE — Telephone Encounter (Signed)
Pt called and would to know what the next steps are for her. She is still have [problems with her bladder and wants to know what she should do. Is there medication she can take or other test that she can do. Please call and let her know. jw

## 2016-05-01 NOTE — Telephone Encounter (Signed)
Pt scheduled for a same day appt Monday. Baylin Cabal Kennon Holter, CMA

## 2016-05-01 NOTE — Telephone Encounter (Signed)
Patient states she did not need a return call

## 2016-05-01 NOTE — Telephone Encounter (Signed)
Left message for patient to call back  

## 2016-05-01 NOTE — Telephone Encounter (Signed)
She also wants to talk to dr Avon Gully about her urinary problems. She said she gave dr fitzgerald the results dr Avon Gully had asked her to tally but she wasn't sure if she gave them to her. She was given an appt for 3;45 today but she wasn't sure she would be able to make it

## 2016-05-04 ENCOUNTER — Other Ambulatory Visit (HOSPITAL_COMMUNITY)
Admission: RE | Admit: 2016-05-04 | Discharge: 2016-05-04 | Disposition: A | Discharge status: Home / Self Care | Payer: Medicare Other | Source: Ambulatory Visit | Attending: Family Medicine | Admitting: Family Medicine

## 2016-05-04 ENCOUNTER — Ambulatory Visit (INDEPENDENT_AMBULATORY_CARE_PROVIDER_SITE_OTHER): Payer: Medicare Other | Admitting: Student

## 2016-05-04 ENCOUNTER — Encounter: Payer: Self-pay | Admitting: Student

## 2016-05-04 ENCOUNTER — Ambulatory Visit (HOSPITAL_COMMUNITY)
Admission: RE | Admit: 2016-05-04 | Discharge: 2016-05-04 | Disposition: A | Discharge status: Home / Self Care | Payer: Medicare Other | Source: Ambulatory Visit | Attending: Internal Medicine | Admitting: Internal Medicine

## 2016-05-04 ENCOUNTER — Other Ambulatory Visit: Payer: Self-pay | Admitting: Internal Medicine

## 2016-05-04 VITALS — BP 133/63 | HR 83 | Temp 100.4°F | Ht 62.0 in | Wt 217.2 lb

## 2016-05-04 DIAGNOSIS — R93421 Abnormal radiologic findings on diagnostic imaging of right kidney: Secondary | ICD-10-CM | POA: Diagnosis not present

## 2016-05-04 DIAGNOSIS — K219 Gastro-esophageal reflux disease without esophagitis: Secondary | ICD-10-CM | POA: Diagnosis not present

## 2016-05-04 DIAGNOSIS — K5710 Diverticulosis of small intestine without perforation or abscess without bleeding: Secondary | ICD-10-CM | POA: Diagnosis not present

## 2016-05-04 DIAGNOSIS — B373 Candidiasis of vulva and vagina: Secondary | ICD-10-CM

## 2016-05-04 DIAGNOSIS — R350 Frequency of micturition: Secondary | ICD-10-CM | POA: Diagnosis not present

## 2016-05-04 DIAGNOSIS — N898 Other specified noninflammatory disorders of vagina: Secondary | ICD-10-CM | POA: Diagnosis not present

## 2016-05-04 DIAGNOSIS — N760 Acute vaginitis: Secondary | ICD-10-CM | POA: Diagnosis not present

## 2016-05-04 DIAGNOSIS — A499 Bacterial infection, unspecified: Secondary | ICD-10-CM

## 2016-05-04 DIAGNOSIS — Z113 Encounter for screening for infections with a predominantly sexual mode of transmission: Secondary | ICD-10-CM | POA: Insufficient documentation

## 2016-05-04 DIAGNOSIS — B3731 Acute candidiasis of vulva and vagina: Secondary | ICD-10-CM

## 2016-05-04 DIAGNOSIS — R935 Abnormal findings on diagnostic imaging of other abdominal regions, including retroperitoneum: Secondary | ICD-10-CM | POA: Insufficient documentation

## 2016-05-04 DIAGNOSIS — K449 Diaphragmatic hernia without obstruction or gangrene: Secondary | ICD-10-CM

## 2016-05-04 DIAGNOSIS — M549 Dorsalgia, unspecified: Principal | ICD-10-CM

## 2016-05-04 DIAGNOSIS — R188 Other ascites: Secondary | ICD-10-CM | POA: Insufficient documentation

## 2016-05-04 DIAGNOSIS — R93422 Abnormal radiologic findings on diagnostic imaging of left kidney: Secondary | ICD-10-CM | POA: Insufficient documentation

## 2016-05-04 DIAGNOSIS — Z0000 Encounter for general adult medical examination without abnormal findings: Secondary | ICD-10-CM

## 2016-05-04 DIAGNOSIS — K7290 Hepatic failure, unspecified without coma: Secondary | ICD-10-CM | POA: Diagnosis not present

## 2016-05-04 DIAGNOSIS — B9689 Other specified bacterial agents as the cause of diseases classified elsewhere: Secondary | ICD-10-CM

## 2016-05-04 DIAGNOSIS — R300 Dysuria: Secondary | ICD-10-CM

## 2016-05-04 DIAGNOSIS — R140 Abdominal distension (gaseous): Secondary | ICD-10-CM

## 2016-05-04 DIAGNOSIS — K7682 Hepatic encephalopathy: Secondary | ICD-10-CM

## 2016-05-04 DIAGNOSIS — K7460 Unspecified cirrhosis of liver: Secondary | ICD-10-CM

## 2016-05-04 DIAGNOSIS — K8020 Calculus of gallbladder without cholecystitis without obstruction: Secondary | ICD-10-CM | POA: Diagnosis not present

## 2016-05-04 DIAGNOSIS — K224 Dyskinesia of esophagus: Secondary | ICD-10-CM | POA: Diagnosis not present

## 2016-05-04 DIAGNOSIS — Z01419 Encounter for gynecological examination (general) (routine) without abnormal findings: Secondary | ICD-10-CM | POA: Diagnosis present

## 2016-05-04 DIAGNOSIS — G8929 Other chronic pain: Secondary | ICD-10-CM

## 2016-05-04 DIAGNOSIS — Z1151 Encounter for screening for human papillomavirus (HPV): Secondary | ICD-10-CM | POA: Diagnosis present

## 2016-05-04 LAB — POCT URINALYSIS DIPSTICK
Blood, UA: NEGATIVE
GLUCOSE UA: NEGATIVE
KETONES UA: NEGATIVE
LEUKOCYTES UA: NEGATIVE
Nitrite, UA: NEGATIVE
UROBILINOGEN UA: 1
pH, UA: 6

## 2016-05-04 LAB — POCT WET PREP (WET MOUNT)
CLUE CELLS WET PREP WHIFF POC: NEGATIVE
Trichomonas Wet Prep HPF POC: ABSENT

## 2016-05-04 MED ORDER — FLUCONAZOLE 150 MG PO TABS: 150.0000 mg | ORAL_TABLET | Freq: Once | ORAL | 0 refills | Status: AC

## 2016-05-04 MED ORDER — METRONIDAZOLE 0.75 % EX GEL: 1.0000 "application " | Freq: Two times a day (BID) | CUTANEOUS | 0 refills | Status: DC

## 2016-05-04 MED ORDER — CEPHALEXIN 500 MG PO CAPS: 500.0000 mg | ORAL_CAPSULE | Freq: Four times a day (QID) | ORAL | 0 refills | Status: AC

## 2016-05-04 MED ORDER — TRAMADOL HCL 50 MG PO TABS: 50.0000 mg | ORAL_TABLET | Freq: Three times a day (TID) | ORAL | 2 refills | Status: DC | PRN

## 2016-05-04 NOTE — Progress Notes (Signed)
   Subjective:    Patient ID: Wendy [name] is a 55 y.o. old female.  HPI #Frequent Urination: this has been going on for several months. She is here today because of increased frequency of urination and urgency. She reports having accidents before making to the bathroom. Denies dysuria, hematuria, vaginal discharge, vaginal bleeding, itching, fever, new back pain, flank pain and pelvic pressure.  However, she reports having clear discharge from her perineal area for about 4 weeks. She reports nocturnal chills and night sweat. She denies pain or swelling.    PMH: reviewed   Review of Systems Per HPI Objective:   Vitals:   05/04/16 1345  BP: 133/63  Pulse: 83  Temp: (!) 100.4 F (38 C)  TempSrc: Oral  Weight: 98.5 kg (217 lb 3.2 oz)  Height: 5\' 2"  (1.575 m)    GEN: Obese, no apparent distress. CVS: RRR, normal s1 and s2 RESP: no increased work of breathing GI: soft, non-tender,non-distended, +BS GU: Negative for suprapubic or CVA tenderness. Extensive area of erythematous skin over inguinal vulvar area. No apparent skin break or wound. Notable whitish to grayish watery discharge. No sign of vesicovaginal or rectovaginal prolapse.  Speculum with  grayish watery discharge, no vaginal dryness, no apparent vaginal wall or cervical lesion. Bimanual exam normal.  NEURO: alert and oriented appropriately, no gross defecits  PSYCH: appropriate mood and affect     Assessment & Plan:  Increased frequency of urination Patient with clinical UTI and fever. However, urinalysis negative in clinic. I will treat clinically with Keflex and send urine for urine culture. Patient has no CVA tenderness and appears well to suspect pyelonephritis or renal abscess  Vaginal discharge Pelvic and speculum exam significant for whitish to grayish watery discharge. This might be a contributing factor to her perineal and inguinal skin irritation. We'll obtain wet prep which was negative. I will send  GC/CT/trich. I'll also treat her for bacterial vaginosis with MetroGel (patient with polypharmacy. I'll avoid adding another medication with systemic effect).   Routine health maintenance Patient is due for Pap smear. Since we're doing a pelvic exam for vaginal discharge, I went ahead and obtained a Pap smear today.

## 2016-05-04 NOTE — Progress Notes (Signed)
Patient was seen by Dr. Avon Gully on 05/01/16. At that time, PCP printed Rx for Tramadol which was unfortunately not signed. Nursing staff made me aware of this as Dr. Avon Gully is on vacation this week and I am covering her inbox. I reprinted the Rx for Tramadol 50mg  tab, take 1-2 tabs q 8 hours PRN #180 with Refills 2 initially printed by Dr. Avon Gully. I placed the initial Rx in the shred box, and placed the reprinted Rx  at front desk for pick up. Please call patient and let her know rx is ready for pick up.

## 2016-05-04 NOTE — Patient Instructions (Signed)
It was great seeing you today! We have addressed the following issues today  1. Frequent urination: I have sent a prescription to the pharmacy for Keflex. Please take this medication for 7 days.   2. Vaginal discharge: I have sent a prescription for Diflucan tablet. I have also sent a prescription for MetroGel. Uses for the next 5 days.    If we did any lab work today, and the results require attention, either me or my nurse will get in touch with you. If everything is normal, you will get a letter in mail. If you don't hear from [place] in two weeks, please give [place] a call. Otherwise, I look forward to talking with you again at our next visit. If you have any questions or concerns before then, please call the clinic at [phone].  Please bring all your medications to every doctors visit   Sign up for My Chart to have easy access to your labs results, and communication with your Primary care physician.    Please check-out at the front desk before leaving the clinic.   Take Care,

## 2016-05-05 ENCOUNTER — Other Ambulatory Visit: Payer: Self-pay | Admitting: Internal Medicine

## 2016-05-05 ENCOUNTER — Other Ambulatory Visit: Payer: Self-pay

## 2016-05-05 ENCOUNTER — Encounter: Payer: Self-pay | Admitting: *Deleted

## 2016-05-05 DIAGNOSIS — G8929 Other chronic pain: Secondary | ICD-10-CM

## 2016-05-05 DIAGNOSIS — M549 Dorsalgia, unspecified: Principal | ICD-10-CM

## 2016-05-05 DIAGNOSIS — R188 Other ascites: Secondary | ICD-10-CM

## 2016-05-05 LAB — CERVICOVAGINAL ANCILLARY ONLY
CHLAMYDIA, DNA PROBE: NEGATIVE
NEISSERIA GONORRHEA: NEGATIVE

## 2016-05-05 LAB — CYTOLOGY - PAP

## 2016-05-05 MED ORDER — SPIRONOLACTONE 100 MG PO TABS: 200.0000 mg | ORAL_TABLET | Freq: Every day | ORAL | 5 refills | Status: DC

## 2016-05-05 MED ORDER — FUROSEMIDE 40 MG PO TABS: 80.0000 mg | ORAL_TABLET | Freq: Every day | ORAL | 5 refills | Status: DC

## 2016-05-05 NOTE — Progress Notes (Signed)
Ascites worse Hiatal hernia still there and causing swallowing problems Complicated situation. Will try to get fluid off by increasing diuretics  Change furosemide to 80 mg q AM (40 mg tabs take 2qAM # 60 5 RF)  Change spironolactone to 200 mg daily ( 100 mg tabs 2 each AM # 60 5 RF)  BMET 9/25 dx: ascites Come to our office and get a weight that day 9/25 also See me in November but we will stay in touch through labs and weights and phone until then

## 2016-05-05 NOTE — Assessment & Plan Note (Addendum)
Patient with clinical UTI and fever. However, urinalysis negative in clinic. I will treat clinically with Keflex and send urine for urine culture. Patient has no CVA tenderness and appears well to suspect pyelonephritis or renal abscess

## 2016-05-06 ENCOUNTER — Ambulatory Visit (HOSPITAL_COMMUNITY): Payer: Self-pay | Admitting: Psychiatry

## 2016-05-06 DIAGNOSIS — N898 Other specified noninflammatory disorders of vagina: Secondary | ICD-10-CM | POA: Insufficient documentation

## 2016-05-06 LAB — URINE CULTURE: ORGANISM ID, BACTERIA: NO GROWTH

## 2016-05-06 NOTE — Assessment & Plan Note (Signed)
Patient is due for Pap smear. Since we're doing a pelvic exam for vaginal discharge, I went ahead and obtained a Pap smear today.

## 2016-05-06 NOTE — Assessment & Plan Note (Signed)
Pelvic and speculum exam significant for whitish to grayish watery discharge. This might be a contributing factor to her perineal and inguinal skin irritation. We'll obtain wet prep which was negative. I will send GC/CT/trich. I'll also treat her for bacterial vaginosis with MetroGel (patient with polypharmacy. I'll avoid adding another medication with systemic effect).

## 2016-05-07 ENCOUNTER — Encounter: Payer: Self-pay | Admitting: Student

## 2016-05-07 ENCOUNTER — Telehealth: Payer: Self-pay | Admitting: Internal Medicine

## 2016-05-07 DIAGNOSIS — R0981 Nasal congestion: Secondary | ICD-10-CM

## 2016-05-07 NOTE — Telephone Encounter (Signed)
Placed referral. Chronic issue not improved by nasal sprays or oral antihistamines, per patient. She requested to see allergist for second opinion.

## 2016-05-07 NOTE — Telephone Encounter (Signed)
Would like referral to allergist.  Dr fitzgerald told her at her last visit to call the clinic if the medicines did not work.

## 2016-05-12 ENCOUNTER — Ambulatory Visit: Payer: Self-pay | Admitting: *Deleted

## 2016-05-13 ENCOUNTER — Other Ambulatory Visit: Payer: Self-pay | Admitting: *Deleted

## 2016-05-15 MED ORDER — OMEPRAZOLE 20 MG PO CPDR: DELAYED_RELEASE_CAPSULE | ORAL | 5 refills | Status: DC

## 2016-05-15 NOTE — Telephone Encounter (Signed)
2nd request.  Sachi Boulay L, RN  

## 2016-05-18 ENCOUNTER — Other Ambulatory Visit (INDEPENDENT_AMBULATORY_CARE_PROVIDER_SITE_OTHER): Payer: Medicare Other

## 2016-05-18 ENCOUNTER — Ambulatory Visit: Payer: Medicare Other | Admitting: Internal Medicine

## 2016-05-18 DIAGNOSIS — K7682 Hepatic encephalopathy: Secondary | ICD-10-CM

## 2016-05-18 DIAGNOSIS — R188 Other ascites: Secondary | ICD-10-CM | POA: Diagnosis not present

## 2016-05-18 DIAGNOSIS — K7460 Unspecified cirrhosis of liver: Secondary | ICD-10-CM

## 2016-05-18 DIAGNOSIS — K7290 Hepatic failure, unspecified without coma: Secondary | ICD-10-CM

## 2016-05-18 DIAGNOSIS — B182 Chronic viral hepatitis C: Secondary | ICD-10-CM

## 2016-05-18 LAB — BASIC METABOLIC PANEL
BUN: 18 mg/dL (ref 6–23)
CALCIUM: 8.7 mg/dL (ref 8.4–10.5)
CO2: 30 mEq/L (ref 19–32)
CREATININE: 1.4 mg/dL — AB (ref 0.40–1.20)
Chloride: 101 mEq/L (ref 96–112)
GFR: 41.28 mL/min — ABNORMAL LOW (ref >60.00)
Glucose, Bld: 173 mg/dL — ABNORMAL HIGH (ref 70–99)
Potassium: 4.9 mEq/L (ref 3.5–5.1)
Sodium: 138 mEq/L (ref 135–145)

## 2016-05-20 ENCOUNTER — Ambulatory Visit: Payer: Self-pay

## 2016-05-20 ENCOUNTER — Other Ambulatory Visit: Payer: Self-pay

## 2016-05-20 DIAGNOSIS — R188 Other ascites: Principal | ICD-10-CM

## 2016-05-20 DIAGNOSIS — K7460 Unspecified cirrhosis of liver: Secondary | ICD-10-CM

## 2016-05-20 MED ORDER — SPIRONOLACTONE 100 MG PO TABS: 150.0000 mg | ORAL_TABLET | Freq: Every day | ORAL | 5 refills | Status: DC

## 2016-05-20 MED ORDER — FUROSEMIDE 40 MG PO TABS: 60.0000 mg | ORAL_TABLET | Freq: Every day | ORAL | 5 refills | Status: DC

## 2016-05-20 NOTE — Progress Notes (Signed)
Weight came down so fluid off butkidney function off some - related to medication increase Let's try furosemide 1.5 tabs daily (60 mg) Spironolactone 1.5 tabs daily (150 mg)   Weight check and BMET in 2 weeks please  Go ahead and put her in for a next available OV also

## 2016-05-23 ENCOUNTER — Other Ambulatory Visit (HOSPITAL_COMMUNITY): Payer: Self-pay | Admitting: Psychiatry

## 2016-05-23 DIAGNOSIS — F331 Major depressive disorder, recurrent, moderate: Secondary | ICD-10-CM

## 2016-05-25 ENCOUNTER — Ambulatory Visit: Payer: Self-pay | Admitting: Internal Medicine

## 2016-05-25 NOTE — Telephone Encounter (Signed)
recieved fax from [facility] requesting a refill for Lexapro. Per Dr. De Nurse, refill request is denied. Pt will need to contact office for an appt.

## 2016-05-26 ENCOUNTER — Ambulatory Visit: Payer: Self-pay | Admitting: *Deleted

## 2016-05-26 ENCOUNTER — Other Ambulatory Visit (HOSPITAL_COMMUNITY): Payer: Self-pay | Admitting: *Deleted

## 2016-05-26 DIAGNOSIS — F331 Major depressive disorder, recurrent, moderate: Secondary | ICD-10-CM

## 2016-05-26 NOTE — Telephone Encounter (Signed)
Medication refill- pt phone office requesting a refill for Lexapro 10mg . Per Dr. De Nurse, refill is authorized for Lexapro 10mg , #21. Prescription was sent to pharmacy. Pt is schedule for a f/u appt on 10/17. Called and informed pt of refill status. Pt verbalizes understanding.

## 2016-05-27 MED ORDER — ESCITALOPRAM OXALATE 10 MG PO TABS: 15.0000 mg | ORAL_TABLET | Freq: Every day | ORAL | 0 refills | Status: DC

## 2016-05-28 ENCOUNTER — Other Ambulatory Visit: Payer: Self-pay | Admitting: Internal Medicine

## 2016-05-28 DIAGNOSIS — L299 Pruritus, unspecified: Secondary | ICD-10-CM

## 2016-06-03 ENCOUNTER — Ambulatory Visit: Payer: Self-pay

## 2016-06-04 ENCOUNTER — Telehealth: Payer: Self-pay | Admitting: Internal Medicine

## 2016-06-04 NOTE — Telephone Encounter (Signed)
Appointment moved to 06/05/16-want to receive her Prevnar 13 here also.

## 2016-06-04 NOTE — Telephone Encounter (Signed)
Patient reports that her appt has been rescheduled for her

## 2016-06-05 ENCOUNTER — Other Ambulatory Visit: Payer: Self-pay | Admitting: *Deleted

## 2016-06-05 MED ORDER — DOCUSATE SODIUM 100 MG PO CAPS: 100.0000 mg | ORAL_CAPSULE | Freq: Two times a day (BID) | ORAL | 2 refills | Status: DC

## 2016-06-08 ENCOUNTER — Other Ambulatory Visit: Payer: Self-pay | Admitting: *Deleted

## 2016-06-08 ENCOUNTER — Ambulatory Visit (INDEPENDENT_AMBULATORY_CARE_PROVIDER_SITE_OTHER): Payer: Medicare Other | Admitting: Internal Medicine

## 2016-06-08 ENCOUNTER — Other Ambulatory Visit (INDEPENDENT_AMBULATORY_CARE_PROVIDER_SITE_OTHER): Payer: Medicare Other

## 2016-06-08 VITALS — Wt 193.6 lb

## 2016-06-08 DIAGNOSIS — Z23 Encounter for immunization: Secondary | ICD-10-CM | POA: Diagnosis not present

## 2016-06-08 DIAGNOSIS — K7682 Hepatic encephalopathy: Secondary | ICD-10-CM

## 2016-06-08 DIAGNOSIS — R188 Other ascites: Secondary | ICD-10-CM

## 2016-06-08 DIAGNOSIS — K7290 Hepatic failure, unspecified without coma: Secondary | ICD-10-CM

## 2016-06-08 DIAGNOSIS — K7460 Unspecified cirrhosis of liver: Secondary | ICD-10-CM

## 2016-06-08 DIAGNOSIS — I10 Essential (primary) hypertension: Secondary | ICD-10-CM

## 2016-06-08 LAB — BASIC METABOLIC PANEL
BUN: 18 mg/dL (ref 6–23)
CALCIUM: 8.9 mg/dL (ref 8.4–10.5)
CO2: 29 mEq/L (ref 19–32)
CREATININE: 1.33 mg/dL — AB (ref 0.40–1.20)
Chloride: 103 mEq/L (ref 96–112)
GFR: 43.79 mL/min — ABNORMAL LOW (ref >60.00)
Glucose, Bld: 199 mg/dL — ABNORMAL HIGH (ref 70–99)
Potassium: 4.4 mEq/L (ref 3.5–5.1)
Sodium: 138 mEq/L (ref 135–145)

## 2016-06-08 MED ORDER — PNEUMOCOCCAL 13-VAL CONJ VACC IM SUSP
0.5000 mL | Freq: Once | INTRAMUSCULAR | Status: AC
  Administered 2016-06-08: 0.5 mL via INTRAMUSCULAR

## 2016-06-09 ENCOUNTER — Other Ambulatory Visit: Payer: Self-pay | Admitting: *Deleted

## 2016-06-09 ENCOUNTER — Ambulatory Visit (HOSPITAL_COMMUNITY): Payer: Self-pay | Admitting: Psychiatry

## 2016-06-09 ENCOUNTER — Other Ambulatory Visit (HOSPITAL_COMMUNITY): Payer: Self-pay | Admitting: Psychiatry

## 2016-06-09 DIAGNOSIS — F331 Major depressive disorder, recurrent, moderate: Secondary | ICD-10-CM

## 2016-06-09 MED ORDER — CARVEDILOL 12.5 MG PO TABS: 12.5000 mg | ORAL_TABLET | Freq: Two times a day (BID) | ORAL | 5 refills | Status: DC

## 2016-06-09 NOTE — Progress Notes (Signed)
Weight coming down which suggests ascites in abdomen is coming off Stay on same meds Weight and BMEt in 1 month please She is on for f/u 12/5

## 2016-06-09 NOTE — Telephone Encounter (Signed)
Medication refill- received fax from [facility] requesting a refill for Lexapro. Per Dr. De Nurse, refill request is denied. Medication was last filled on 05/27/16. Pt no showed appt today. lvm for pt to contact office. Pt is schedule for a f/u appt on 10/20.

## 2016-06-10 ENCOUNTER — Other Ambulatory Visit: Payer: Self-pay

## 2016-06-10 ENCOUNTER — Telehealth: Payer: Self-pay | Admitting: *Deleted

## 2016-06-10 ENCOUNTER — Ambulatory Visit: Payer: Self-pay | Admitting: Allergy and Immunology

## 2016-06-10 DIAGNOSIS — K7469 Other cirrhosis of liver: Secondary | ICD-10-CM

## 2016-06-10 MED ORDER — TRAZODONE HCL 50 MG PO TABS: 50.0000 mg | ORAL_TABLET | Freq: Every evening | ORAL | 0 refills | Status: DC | PRN

## 2016-06-10 NOTE — Telephone Encounter (Signed)
Left message on patient's home VM that tomorrow's appt for AWV needs to be rescheduled. Requested return call to reschedule. Velora Heckler, RN

## 2016-06-10 NOTE — Telephone Encounter (Signed)
Will refill trazodone for one month (#30 tablets). Per Dr. Bari Mantis note, patient's excessive daytime sleepiness likely due to the multiple sedating medications she is on, and he recommended weaning her off of some of these. Patient has not been evaluated at this office for insomnia in over 6 months, and needs to be seen again before I will continue to refill this medication, as it is not good medical care for her to be as sedated as it seems she is from notes from other providers. Patient to be informed of this by phone.   Adin Hector, MD, MPH PGY-2 [facility] Pager [phone]

## 2016-06-10 NOTE — Telephone Encounter (Signed)
Pt is calling back to let you know that  She received your message about canceling her appointment. jw

## 2016-06-11 ENCOUNTER — Ambulatory Visit: Payer: Self-pay | Admitting: *Deleted

## 2016-06-11 ENCOUNTER — Ambulatory Visit: Payer: Self-pay | Admitting: Internal Medicine

## 2016-06-11 ENCOUNTER — Ambulatory Visit (HOSPITAL_COMMUNITY): Payer: Self-pay | Admitting: Psychiatry

## 2016-06-11 ENCOUNTER — Telehealth: Payer: Self-pay | Admitting: Internal Medicine

## 2016-06-11 NOTE — Telephone Encounter (Signed)
We can see if she can get an Rx for ranitidine 300 mg qhs and see if that works - # 30 5 RF  Ask her if an rx would be cheaper  Not sure how much is too much

## 2016-06-11 NOTE — Telephone Encounter (Signed)
Any suggestions Sir, thank you. 

## 2016-06-11 NOTE — Telephone Encounter (Signed)
Left message to call me back.

## 2016-06-12 ENCOUNTER — Other Ambulatory Visit: Payer: Self-pay | Admitting: Internal Medicine

## 2016-06-12 ENCOUNTER — Ambulatory Visit (HOSPITAL_COMMUNITY): Payer: Self-pay | Admitting: Psychiatry

## 2016-06-12 DIAGNOSIS — G8929 Other chronic pain: Secondary | ICD-10-CM

## 2016-06-12 DIAGNOSIS — M545 Low back pain, unspecified: Secondary | ICD-10-CM

## 2016-06-12 MED ORDER — RANITIDINE HCL 300 MG PO TABS: 300.0000 mg | ORAL_TABLET | Freq: Every day | ORAL | 5 refills | Status: DC

## 2016-06-12 NOTE — Telephone Encounter (Signed)
Pt called me back and I was at lunch.  I tried to call her back and had to leave another message.

## 2016-06-12 NOTE — Telephone Encounter (Signed)
Patient informed, already has follow up scheduled for 10/26 with PCP.

## 2016-06-12 NOTE — Telephone Encounter (Signed)
Tried to reach patient before leaving for the weekend. Unable to do so , therefore left a detailed message that [facility]idine [facility] [address]. For her to pick up if not too expensive.

## 2016-06-15 NOTE — Telephone Encounter (Signed)
[facility] called [facility] and Aubria picked up the generic zantac Friday night.

## 2016-06-16 ENCOUNTER — Other Ambulatory Visit: Payer: Self-pay | Admitting: *Deleted

## 2016-06-16 DIAGNOSIS — F4001 Agoraphobia with panic disorder: Secondary | ICD-10-CM

## 2016-06-18 ENCOUNTER — Encounter: Payer: Self-pay | Admitting: Internal Medicine

## 2016-06-18 ENCOUNTER — Ambulatory Visit (INDEPENDENT_AMBULATORY_CARE_PROVIDER_SITE_OTHER): Payer: Medicare Other | Admitting: Psychiatry

## 2016-06-18 ENCOUNTER — Telehealth: Payer: Self-pay | Admitting: Internal Medicine

## 2016-06-18 ENCOUNTER — Encounter (HOSPITAL_COMMUNITY): Payer: Self-pay | Admitting: Psychiatry

## 2016-06-18 ENCOUNTER — Ambulatory Visit (INDEPENDENT_AMBULATORY_CARE_PROVIDER_SITE_OTHER): Payer: Medicare Other | Admitting: Internal Medicine

## 2016-06-18 VITALS — BP 128/76 | HR 66 | Ht 62.0 in | Wt 192.8 lb

## 2016-06-18 VITALS — BP 119/68 | HR 61 | Temp 98.2°F | Ht 62.0 in | Wt 193.2 lb

## 2016-06-18 DIAGNOSIS — F411 Generalized anxiety disorder: Secondary | ICD-10-CM

## 2016-06-18 DIAGNOSIS — F0630 Mood disorder due to known physiological condition, unspecified: Secondary | ICD-10-CM

## 2016-06-18 DIAGNOSIS — F331 Major depressive disorder, recurrent, moderate: Secondary | ICD-10-CM

## 2016-06-18 DIAGNOSIS — R531 Weakness: Secondary | ICD-10-CM

## 2016-06-18 DIAGNOSIS — F4001 Agoraphobia with panic disorder: Secondary | ICD-10-CM

## 2016-06-18 DIAGNOSIS — L659 Nonscarring hair loss, unspecified: Secondary | ICD-10-CM

## 2016-06-18 DIAGNOSIS — E039 Hypothyroidism, unspecified: Secondary | ICD-10-CM | POA: Diagnosis not present

## 2016-06-18 DIAGNOSIS — Z818 Family history of other mental and behavioral disorders: Secondary | ICD-10-CM

## 2016-06-18 DIAGNOSIS — Z8489 Family history of other specified conditions: Secondary | ICD-10-CM

## 2016-06-18 DIAGNOSIS — Z8249 Family history of ischemic heart disease and other diseases of the circulatory system: Secondary | ICD-10-CM

## 2016-06-18 LAB — TSH: TSH: 8.08 m[IU]/L — AB

## 2016-06-18 MED ORDER — QUETIAPINE FUMARATE 25 MG PO TABS: 25.0000 mg | ORAL_TABLET | Freq: Every day | ORAL | 2 refills | Status: DC

## 2016-06-18 MED ORDER — CLONAZEPAM 1 MG PO TABS: 0.5000 mg | ORAL_TABLET | Freq: Two times a day (BID) | ORAL | 2 refills | Status: DC | PRN

## 2016-06-18 MED ORDER — ESCITALOPRAM OXALATE 10 MG PO TABS: 15.0000 mg | ORAL_TABLET | Freq: Every day | ORAL | 2 refills | Status: DC

## 2016-06-18 NOTE — Assessment & Plan Note (Signed)
Possibly related to hypothyroidism, or simply due to increasing age. Appearance not consistent with allopecia arreata.  - Check TSH today - Recommended continuing biotin and trying OTC Rogaine
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
## 2016-06-18 NOTE — Assessment & Plan Note (Signed)
Possibly 2/2 poorly controlled hypothyroidism vs deconditioning. Able to walk, sit, and stand normally with assistance of walker. No neurological deficits on exam today.  - Check TSH. Adjust Synthroid accordingly.  - Referral to physical therapy

## 2016-06-18 NOTE — Patient Instructions (Signed)
It was nice seeing you again today Wendy [name]!  For your hair, you can continue taking Biotin. You can also try an over the counter hair growth product, such as Rogaine for Women. Make sure to use this as long as the instructions on the box say. If you do not use it for the full amount of time, it likely will not work as well.   I have placed a referral to physical therapy for you. They will call you with the date and time of your appointment.   I will let you know the results of your thyroid level, and we will make any changes to your thyroid medication as needed.   If you have any questions or concerns, please feel free to call the clinic.   Be well,  Dr. Avon Gully

## 2016-06-18 NOTE — Progress Notes (Signed)
   Subjective:    Patient ID: Wendy [name], female    DOB: 1960-09-23, 55 y.o.   MRN: [id number]  HPI  Patient presents to discuss weakness and thinning hair.   Weakness Not a new problem. Patient reports that she gets tired easily when walking, and her must feel weak after only moderate exertion. She uses a walker, and is able to ambulate well with this, but not for very long distances. Also reports DOE. Patient went to physical therapy in the past for these symptoms and report that her condition improved significantly. She stopped going when insurance stopped covering her visits. She did not follow through with the exercises she was given to do at home, and thinks this is part of the reason why she feels weak again. She thinks if she could go back to physical therapy she could become stronger. Denies recent falls, though does feel dizzy sometimes (not a new problem).   Thinning hair Has been ongoing for multiple years. Was told many years ago to begin taking prenatal vitamins, which patient reports stopped her hair loss. Patient stopped taking PNV and has started taking Centrum vitamins per her ophthalmologist's recommendation, and says her hair has started falling out again. Says she has noticed that her hair "sheds" a lot, and also comes out when she is brushing it. Notices that her eyebrows seem thinner as well. Does not notice thinning hair elsewhere, such as on her arms or legs. Has been taking Biotin in addition to Centrum vitamins, but has not tried any OTC shampoos or serums.   Smoking status reviewed.   Review of Systems See HPI.     Objective:   Physical Exam  Constitutional: She is oriented to person, place, and time. She appears well-developed and well-nourished. No distress.  HENT:  Head: Normocephalic and atraumatic.  Pulmonary/Chest: Effort normal. No respiratory distress.  Musculoskeletal:  Walks slowly but with normal gait with assistance of walker. Is able to sit and  stand normally when holding on to walker. 5/5 strength upper and lower extremities bilaterally.   Neurological: She is alert and oriented to person, place, and time.  Psychiatric: She has a normal mood and affect. Her behavior is normal.      Assessment & Plan:  Thinning hair Possibly related to hypothyroidism, or simply due to increasing age. Appearance not consistent with allopecia arreata.  - Check TSH today - Recommended continuing biotin and trying OTC Rogaine  Generalized weakness Possibly 2/2 poorly controlled hypothyroidism vs deconditioning. Able to walk, sit, and stand normally with assistance of walker. No neurological deficits on exam today.  - Check TSH. Adjust Synthroid accordingly.  - Referral to physical therapy  Adin Hector, MD, MPH PGY-2 [facility] Pager [phone]

## 2016-06-19 ENCOUNTER — Telehealth: Payer: Self-pay | Admitting: Internal Medicine

## 2016-06-19 NOTE — Telephone Encounter (Signed)
Pt would like Dr. Cyndia Skeeters to call her regarding labs and pap done back in September. Pt stated Dr. Avon Gully prescribed Rogaine, but it is $50 and the pt can not afford that. Pt would like something equal to it, but affordable. Please advise. Thanks! ep

## 2016-06-22 ENCOUNTER — Telehealth: Payer: Self-pay | Admitting: Internal Medicine

## 2016-06-22 ENCOUNTER — Other Ambulatory Visit: Payer: Self-pay | Admitting: Internal Medicine

## 2016-06-22 DIAGNOSIS — L299 Pruritus, unspecified: Secondary | ICD-10-CM

## 2016-06-22 MED ORDER — LEVOTHYROXINE SODIUM 200 MCG PO TABS: 200.0000 ug | ORAL_TABLET | Freq: Every day | ORAL | 3 refills | Status: DC

## 2016-06-22 NOTE — Telephone Encounter (Signed)
Spoke with patient regarding elevated TSH. Will increase Synthroid to 267mcg. Discussed with patient again that this will possibly improve some of the symptoms she is experiencing, including thinning hair. As Rogaine is $50, patient will not purchase right now. Can consider other options if thinning hair does not improve with improvement in thyroid functioning. Prescription for 269mcg Synthroid sent to pharmacy.   Adin Hector, MD, MPH PGY-2 [facility] Pager [phone]

## 2016-06-25 ENCOUNTER — Ambulatory Visit (INDEPENDENT_AMBULATORY_CARE_PROVIDER_SITE_OTHER): Payer: Medicare Other | Admitting: Neurology

## 2016-06-25 ENCOUNTER — Encounter: Payer: Self-pay | Admitting: Neurology

## 2016-06-25 ENCOUNTER — Other Ambulatory Visit: Payer: Self-pay | Admitting: Internal Medicine

## 2016-06-25 VITALS — BP 122/84 | HR 68 | Ht 62.0 in | Wt 188.9 lb

## 2016-06-25 DIAGNOSIS — R296 Repeated falls: Secondary | ICD-10-CM

## 2016-06-25 DIAGNOSIS — F17200 Nicotine dependence, unspecified, uncomplicated: Secondary | ICD-10-CM

## 2016-06-25 DIAGNOSIS — S069X5D Unspecified intracranial injury with loss of consciousness greater than 24 hours with return to pre-existing conscious level, subsequent encounter: Secondary | ICD-10-CM

## 2016-06-25 DIAGNOSIS — R4189 Other symptoms and signs involving cognitive functions and awareness: Secondary | ICD-10-CM

## 2016-06-25 DIAGNOSIS — R4689 Other symptoms and signs involving appearance and behavior: Secondary | ICD-10-CM

## 2016-06-25 DIAGNOSIS — R42 Dizziness and giddiness: Secondary | ICD-10-CM | POA: Diagnosis not present

## 2016-06-25 DIAGNOSIS — M47816 Spondylosis without myelopathy or radiculopathy, lumbar region: Secondary | ICD-10-CM

## 2016-06-25 NOTE — Telephone Encounter (Signed)
Spoke with patient prior to receiving Dr. Justus Memory message. Informed patient that med refills are best done through original prescriber. Attempted to reach patient to let her know she needs appt to discuss baclofen but there was no answer and VM was full. Will make second attempt later.  Hubbard Hartshorn, RN, BSN
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
## 2016-06-25 NOTE — Patient Instructions (Signed)
1.  Home Health service tried to contact you but nobody answered or returned the phone call.  Please let them contact somebody else (emergency contact) to try and set up an appointment. 2.  Take care when getting out of bed or standing up. 3.  Always use walker 4.  Follow up as needed.

## 2016-06-25 NOTE — Telephone Encounter (Signed)
Pt would like a refill on muscle relaxer's. [facility] [address]. Pt also would like a list of who is charge of pt's different medications. Please advise. Thanks! ep

## 2016-06-27 ENCOUNTER — Telehealth: Payer: Self-pay | Admitting: Internal Medicine

## 2016-06-27 NOTE — Telephone Encounter (Signed)
Chart reviewed. Patient calls today with multiple complaints and questions. She tells me that she fell a couple of times 2 weeks ago, that she has been dizzy, that she has nausea, that she wants a prescription for Phenergan. She has multiple significant medical problems and is on multiple medications. I told her that it would not be possible or appropriate to evaluate these complaints on the background of her medical history. As well, it would not be appropriate to prescribe Phenergan empirically. Recommend that she presented herself to urgent care, the emergency room, or PCP as soon as possible for formal evaluation. She was agreeable

## 2016-06-29 NOTE — Telephone Encounter (Signed)
Left message on Patient's VM asking her to call back to schedule appt with Dr. Avon Gully.  Hubbard Hartshorn, RN, BSN

## 2016-07-01 ENCOUNTER — Ambulatory Visit: Payer: Self-pay | Admitting: Internal Medicine

## 2016-07-01 ENCOUNTER — Telehealth: Payer: Self-pay | Admitting: Internal Medicine

## 2016-07-01 NOTE — Telephone Encounter (Signed)
Patient report itching and atarax is not helping.  She is asking for additional medications for itching.  Please advise.

## 2016-07-02 ENCOUNTER — Telehealth: Payer: Self-pay | Admitting: Internal Medicine

## 2016-07-02 NOTE — Telephone Encounter (Signed)
I am not aware this is coming from liver disease and would see if she can see PCP about it

## 2016-07-02 NOTE — Telephone Encounter (Signed)
Left message for patient to call back  

## 2016-07-02 NOTE — Telephone Encounter (Signed)
Pt informed. Attempted to schedule her an appt but she hung up. Candus Braud Kennon Holter, CMA

## 2016-07-02 NOTE — Telephone Encounter (Signed)
Needs refill on her "itching pills".  She is taking hydroxyzine right but she needs something to go along with them. [facility] [address]

## 2016-07-02 NOTE — Telephone Encounter (Signed)
Patient notified

## 2016-07-02 NOTE — Telephone Encounter (Signed)
Will forward to PCP (see previous phone note from GI office)

## 2016-07-07 ENCOUNTER — Ambulatory Visit: Payer: Self-pay | Admitting: Allergy and Immunology

## 2016-07-07 ENCOUNTER — Other Ambulatory Visit: Payer: Self-pay | Admitting: Internal Medicine

## 2016-07-07 NOTE — Telephone Encounter (Signed)
Left message on patient voicemail that she has an upcoming appointment on 11/17 with PCP to address her refills.

## 2016-07-07 NOTE — Telephone Encounter (Signed)
Pt has questions about refills on her medications. She is having problems getting some of her meds refills.  She also hasnt heard from PT.  She is having trouble getting her musle relaxer refill. She is having trouble sleeping at night because her legs hurt so bad.

## 2016-07-08 ENCOUNTER — Other Ambulatory Visit: Payer: Self-pay | Admitting: Internal Medicine

## 2016-07-09 NOTE — Telephone Encounter (Signed)
Patient initially prescribed trazodone by prior provider. Have not personally evaluated patient's insomnia myself, however in looking at specialist's notes, Dr. Elsworth Soho feels that patient's reported excessive daytime sleepiness is possibly due to polypharmacy, including treatment with trazodone. As I have not discussed this with patient personally, will write one month refill of trazodone, but she must be seen in office to discuss continued use of this medication before I will write additional refills. Plan to begin weaning trazodone at next visit.   Adin Hector, MD, MPH PGY-2 [place] [facility] Pager [phone]

## 2016-07-09 NOTE — Telephone Encounter (Signed)
Left message on voicemail for patient to return call. 

## 2016-07-10 ENCOUNTER — Ambulatory Visit: Payer: Self-pay | Admitting: Internal Medicine

## 2016-07-13 ENCOUNTER — Other Ambulatory Visit: Payer: Self-pay | Admitting: Internal Medicine

## 2016-07-13 DIAGNOSIS — L299 Pruritus, unspecified: Secondary | ICD-10-CM

## 2016-07-15 ENCOUNTER — Ambulatory Visit: Payer: Self-pay | Admitting: Internal Medicine

## 2016-07-23 ENCOUNTER — Encounter: Payer: Self-pay | Admitting: Internal Medicine

## 2016-07-23 ENCOUNTER — Ambulatory Visit (INDEPENDENT_AMBULATORY_CARE_PROVIDER_SITE_OTHER): Payer: Medicare Other | Admitting: Internal Medicine

## 2016-07-23 ENCOUNTER — Ambulatory Visit: Payer: Medicare Other | Admitting: Internal Medicine

## 2016-07-23 ENCOUNTER — Other Ambulatory Visit (INDEPENDENT_AMBULATORY_CARE_PROVIDER_SITE_OTHER): Payer: Medicare Other

## 2016-07-23 VITALS — Wt 193.2 lb

## 2016-07-23 DIAGNOSIS — L299 Pruritus, unspecified: Secondary | ICD-10-CM | POA: Diagnosis not present

## 2016-07-23 DIAGNOSIS — G8929 Other chronic pain: Secondary | ICD-10-CM | POA: Diagnosis not present

## 2016-07-23 DIAGNOSIS — K7469 Other cirrhosis of liver: Secondary | ICD-10-CM

## 2016-07-23 DIAGNOSIS — R202 Paresthesia of skin: Secondary | ICD-10-CM

## 2016-07-23 DIAGNOSIS — Z9889 Other specified postprocedural states: Secondary | ICD-10-CM | POA: Diagnosis not present

## 2016-07-23 DIAGNOSIS — M545 Low back pain: Secondary | ICD-10-CM | POA: Diagnosis not present

## 2016-07-23 LAB — BASIC METABOLIC PANEL
BUN: 26 mg/dL — ABNORMAL HIGH (ref 6–23)
CO2: 28 mEq/L (ref 19–32)
Calcium: 9.6 mg/dL (ref 8.4–10.5)
Chloride: 101 mEq/L (ref 96–112)
Creatinine, Ser: 1.77 mg/dL — ABNORMAL HIGH (ref 0.40–1.20)
GFR: 31.48 mL/min — AB (ref >60.00)
GLUCOSE: 107 mg/dL — AB (ref 70–99)
POTASSIUM: 4.4 meq/L (ref 3.5–5.1)
SODIUM: 134 meq/L — AB (ref 135–145)

## 2016-07-23 MED ORDER — HYDROXYZINE HCL 25 MG PO TABS: ORAL_TABLET | ORAL | 0 refills | Status: DC

## 2016-07-23 MED ORDER — GABAPENTIN 300 MG PO CAPS: 300.0000 mg | ORAL_CAPSULE | Freq: Three times a day (TID) | ORAL | 3 refills | Status: DC

## 2016-07-23 NOTE — Assessment & Plan Note (Signed)
Well-controlled on long-term hydroxyzine 25-50mg  BID. Likely multifactorial, as patient with history of cirrhosis and Hep C (s/p Harvoni treatment) as well as anxiety/psych component. Patient also taking tramadol for chronic pain which could be contributing to itching, though not willing to discontinue. Patient is still followed by hepatologist at [facility].  - No recent LFTs, so will obtain today - Refilled hydroxyzine  - Consider beginning cholestyramine pending LFTs and no improvement in symptoms

## 2016-07-23 NOTE — Assessment & Plan Note (Addendum)
Bilateral. Occurs throughout the day, seeming less consistent with Restless Leg. Description of "spider webs" and tingling more consistent with nerve pain than other etiology. Itching likely related to patient's generalized pruritis. Explained to patient that as pain seems more nerve-related, Flexeril is not the best treatment choice. Also explained that given patient's frequent falls, Flexeril is not a good option. Patient amenable to trying gabapentin instead.  - Begin gabapentin 300mg  TID. Explained to patient that we will likely need to increase the dose to achieve symptomatic relief.  - F/u in 2-4 weeks to monitor symptom improvement. Will increase dose as needed at that time.

## 2016-07-23 NOTE — Patient Instructions (Addendum)
It was nice seeing you again today Wendy [name]!   Please begin taking gabapentin one tablet (300 mg) three times a day (breakfast, lunch, and dinner). You may not see immediate results, but we need to increase the dose gradually so you will not become too drowsy. We can increase the dose at your next appointment if we need to.   You can continue to take hydroxyzine for itching.   I will see you back in 2-4 weeks to see if your leg symptoms have improved with the gabapentin.   If you have any questions or concerns, please feel free to call the clinic.   Be well,  Dr. Avon Gully

## 2016-07-23 NOTE — Progress Notes (Signed)
Patient came in for weight check. Went to lab for blood draw.

## 2016-07-23 NOTE — Progress Notes (Signed)
   Subjective:    Patient ID: Wendy [name], female    DOB: 02/10/1961, 55 y.o.   MRN: [id number]  HPI  Patient presents to discuss pruritis and lower extremity discomfort.   Generalized pruritis Ongoing for over a year. Patient has history of cirrhosis and Hep C, and has been told by her hepatologist at [facility] and her itching is 2/2 liver issues. Patient began taking hydroxyzine over a year ago, and reports that her itching is well-controlled with 25-50mg  BID. Reports itching mainly on her back and mainly at night. Does not experience any side effects with hydroxyzine, including no drowsiness. Patient was taking loratadine for possible allergic component, however has recently been seen by an allergist who told her to stop taking this. She has another appointment with the allergist soon. Also has another appointment with hepatologist in the spring.   Lower extremity discomfort Patient requesting muscle relaxer for leg discomfort. Describes sensation as "like spider webs on my legs." Also describe itching, cramping, and tingling. Occurs throughout the day. Not worse at night. Was previously taking Flexeril about a year ago but stopped because she did not think it was making a difference. Now she thinks it may have been helping after all.   Smoking status reviewed. Patient is current every day smoker.    Review of Systems See HPI.     Objective:   Physical Exam  Constitutional: She is oriented to person, place, and time. She appears well-developed and well-nourished. No distress.  HENT:  Head: Normocephalic and atraumatic.  Eyes: Conjunctivae and EOM are normal. Right eye exhibits no discharge. Left eye exhibits no discharge.  Pulmonary/Chest: Effort normal. No respiratory distress.  Musculoskeletal:  Ambulates well with walker. No gait abnormalities.   Neurological: She is alert and oriented to person, place, and time.  Skin: Skin is warm and dry.  Psychiatric: She has a normal mood and  affect. Her behavior is normal.      Assessment & Plan:  Generalized pruritus Well-controlled on long-term hydroxyzine 25-50mg  BID. Likely multifactorial, as patient with history of cirrhosis and Hep C (s/p Harvoni treatment) as well as anxiety/psych component. Patient also taking tramadol for chronic pain which could be contributing to itching, though not willing to discontinue. Patient is still followed by hepatologist at [facility].  - No recent LFTs, so will obtain today - Refilled hydroxyzine  - Consider beginning cholestyramine pending LFTs and no improvement in symptoms  Paresthesia of lower extremity Bilateral. Occurs throughout the day, seeming less consistent with Restless Leg. Description of "spider webs" and tingling more consistent with nerve pain than other etiology. Itching likely related to patient's generalized pruritis. Explained to patient that as pain seems more nerve-related, Flexeril is not the best treatment choice. Also explained that given patient's frequent falls, Flexeril is not a good option. Patient amenable to trying gabapentin instead.  - Begin gabapentin 300mg  TID. Explained to patient that we will likely need to increase the dose to achieve symptomatic relief.  - F/u in 2-4 weeks to monitor symptom improvement. Will increase dose as needed at that time.   Adin Hector, MD, MPH PGY-2 [place] [facility] Pager [phone]

## 2016-07-24 ENCOUNTER — Telehealth: Payer: Self-pay | Admitting: Internal Medicine

## 2016-07-24 ENCOUNTER — Other Ambulatory Visit: Payer: Self-pay

## 2016-07-24 DIAGNOSIS — R296 Repeated falls: Secondary | ICD-10-CM

## 2016-07-24 DIAGNOSIS — R531 Weakness: Secondary | ICD-10-CM

## 2016-07-24 LAB — HEPATIC FUNCTION PANEL
ALT: 16 U/L (ref 6–29)
AST: 30 U/L (ref 10–35)
Albumin: 2.6 g/dL — ABNORMAL LOW (ref 3.6–5.1)
Alkaline Phosphatase: 98 U/L (ref 33–130)
BILIRUBIN DIRECT: 0.3 mg/dL — AB (ref <=0.2)
Indirect Bilirubin: 1 mg/dL (ref 0.2–1.2)
TOTAL PROTEIN: 6.9 g/dL (ref 6.1–8.1)
Total Bilirubin: 1.3 mg/dL — ABNORMAL HIGH (ref 0.2–1.2)

## 2016-07-24 MED ORDER — FUROSEMIDE 40 MG PO TABS
40.0000 mg | ORAL_TABLET | Freq: Every day | ORAL | 5 refills | Status: DC
Start: 2016-07-24 — End: 2016-12-14

## 2016-07-24 MED ORDER — SPIRONOLACTONE 100 MG PO TABS: 100.0000 mg | ORAL_TABLET | Freq: Every day | ORAL | 5 refills | Status: DC

## 2016-07-24 NOTE — Telephone Encounter (Signed)
Pt is calling and would like to have a referral for PT. jw

## 2016-07-24 NOTE — Progress Notes (Signed)
Kidney function slightly worse Meloxicam could be contributing to this problem and would stop if she can tolerate that For now reduce furosemide and spironolactone to 40 mg and 100 mg daily She is seeing me next week and will discuss more

## 2016-07-24 NOTE — Telephone Encounter (Signed)
Left message for patient to call back  

## 2016-07-27 ENCOUNTER — Telehealth: Payer: Self-pay | Admitting: Internal Medicine

## 2016-07-27 ENCOUNTER — Telehealth: Payer: Self-pay | Admitting: Neurology

## 2016-07-27 DIAGNOSIS — S069X0D Unspecified intracranial injury without loss of consciousness, subsequent encounter: Secondary | ICD-10-CM

## 2016-07-27 DIAGNOSIS — IMO0001 Reserved for inherently not codable concepts without codable children: Secondary | ICD-10-CM

## 2016-07-27 NOTE — Telephone Encounter (Signed)
Pt was informed, but stated no one has called her yet for an appointment with PT. Please advise. Thanks! ep

## 2016-07-27 NOTE — Telephone Encounter (Signed)
I highly doubt it is related to a new/acute neurologic issue.  It could be related to medication or history of cirrhosis.  I would direct this to her PCP.

## 2016-07-27 NOTE — Telephone Encounter (Signed)
Please call patient to make an appointment with a provider for today or tomorrow.  Derl Barrow, RN

## 2016-07-27 NOTE — Telephone Encounter (Signed)
Pt called to report that she has been experiencing shakes and is unable to move when she has them. Pt would like to if she should come in for an appointment or what she needs to do. Per last OV on 06/27/16, "  She has chronic static issues related to her remote TBI.  No further follow unless something acute or new recurs. " Pt did call and report issue to PCP, scheduled to see them on Wednesday 07/29/16. Please advise.

## 2016-07-27 NOTE — Telephone Encounter (Signed)
Pt has been experiencing the shakes and is unable to move when she has them. Pt would like to know if she needs to be referred out or make an appointment to come in or what she needs to do. Please advise. Thanks! ep

## 2016-07-27 NOTE — Telephone Encounter (Signed)
Appointment was scheduled for 07-29-16 @ 11:30 with Dr. Jacinto Reap. Wendy [name]

## 2016-07-27 NOTE — Telephone Encounter (Signed)
PATIENT needs to talk to someone about what is going on with her please call [phone]

## 2016-07-27 NOTE — Telephone Encounter (Signed)
Discussed with patient. Will wait to see what PCP has to say about shaking. [facility] had never called her. Resubmitted referral to [facility].

## 2016-07-28 ENCOUNTER — Ambulatory Visit (INDEPENDENT_AMBULATORY_CARE_PROVIDER_SITE_OTHER): Payer: Medicare Other | Admitting: Internal Medicine

## 2016-07-28 ENCOUNTER — Other Ambulatory Visit (INDEPENDENT_AMBULATORY_CARE_PROVIDER_SITE_OTHER): Payer: Medicare Other

## 2016-07-28 ENCOUNTER — Encounter: Payer: Self-pay | Admitting: Internal Medicine

## 2016-07-28 VITALS — BP 136/82 | HR 66 | Ht 62.0 in | Wt 190.0 lb

## 2016-07-28 DIAGNOSIS — L299 Pruritus, unspecified: Secondary | ICD-10-CM

## 2016-07-28 DIAGNOSIS — N289 Disorder of kidney and ureter, unspecified: Secondary | ICD-10-CM

## 2016-07-28 DIAGNOSIS — R188 Other ascites: Secondary | ICD-10-CM | POA: Diagnosis not present

## 2016-07-28 DIAGNOSIS — K449 Diaphragmatic hernia without obstruction or gangrene: Secondary | ICD-10-CM

## 2016-07-28 DIAGNOSIS — K7469 Other cirrhosis of liver: Secondary | ICD-10-CM

## 2016-07-28 LAB — BASIC METABOLIC PANEL
BUN: 19 mg/dL (ref 6–23)
CALCIUM: 8.9 mg/dL (ref 8.4–10.5)
CO2: 27 mEq/L (ref 19–32)
Chloride: 107 mEq/L (ref 96–112)
Creatinine, Ser: 1.21 mg/dL — ABNORMAL HIGH (ref 0.40–1.20)
GFR: 48.82 mL/min — AB (ref >60.00)
GLUCOSE: 225 mg/dL — AB (ref 70–99)
POTASSIUM: 4.3 meq/L (ref 3.5–5.1)
SODIUM: 139 meq/L (ref 135–145)

## 2016-07-28 NOTE — Progress Notes (Addendum)
Wendy [name] 55 y.o. 11-24-1960 [id number]  Assessment & Plan:     Encounter Diagnoses  Name Primary?  . Other cirrhosis of liver ([place])   . Other ascites Yes  . Pruritus   . Paraesophageal hiatal hernia   . Renal insufficiency      Recheck BMET ? Paracentesis Stop meloxicam Acetaminophen up to 2 g daily ok F/U TBD after labs ? 3 mos She thinks she sees Dr. Monica [name] in March Pruritis a bit puzzling w/o cholestatic features on labs at least [id number] Wendy Alberta, MD   Subjective:   Chief Complaint: abdominal bloating  HPI Still feeling swollen and bloated. Have been trying to reduce ascites via diuretics, creatine has risen and diuretics cut in half. Weight about stable lately suspect weight 183 mistake. Itching all over intermittently - no rash. Hydroxyzine providing some relief. Not having dysphagia at the present but has in past - has paraesophageal hiatal hernia.  In past couple of mos started meloxicam for joint oains Wt Readings from Last 3 Encounters:  07/28/16 190 lb (86.2 kg)  07/23/16 193 lb 3.2 oz (87.6 kg)  07/23/16 183 lb (83 kg)   Allergies  Allergen Reactions  . Codeine Phosphate Itching  . Codeine Rash   Outpatient Medications Prior to Visit  Medication Sig Dispense Refill  . alendronate (FOSAMAX) 70 MG tablet Take 1 tablet (70 mg total) by mouth once a week. 12 tablet 0  . aluminum hydroxide-magnesium carbonate (GAVISCON) 95-358 MG/15ML SUSP Take 30 mLs by mouth at bedtime.  0  . Biotin 1000 MCG tablet Take 1,000 mcg by mouth 3 (three) times daily.    . bisacodyl ([employer]S GENTLE LAXATIVE) 5 MG EC tablet TAKE 1 TABLET (5 MG TOTAL) BY MOUTH DAILY. (Patient taking differently: daily as needed. TAKE 1 TABLET (5 MG TOTAL) BY MOUTH DAILY.) 30 tablet 2  . carvedilol (COREG) 12.5 MG tablet Take 1 tablet (12.5 mg total) by mouth 2 (two) times daily with a meal. 60 tablet 5  . Cholecalciferol (VITAMIN D3) 2000 UNITS capsule Take 2,000 Units by mouth  daily.    . clonazePAM (KLONOPIN) 1 MG tablet Take 0.5-1 tablets (0.5-1 mg total) by mouth 2 (two) times daily as needed for anxiety. 30 tablet 2  . cycloSPORINE (RESTASIS) 0.05 % ophthalmic emulsion Place 1 drop into both eyes 2 (two) times daily. 0.4 mL 0  . docusate sodium (COLACE) 100 MG capsule Take 1 capsule (100 mg total) by mouth 2 (two) times daily. 60 capsule 2  . escitalopram (LEXAPRO) 10 MG tablet Take 1.5 tablets (15 mg total) by mouth daily. One time refill only. 45 tablet 2  . fluticasone (FLONASE) 50 MCG/ACT nasal spray Place 2 sprays into both nostrils daily. 16 g 6  . furosemide (LASIX) 40 MG tablet Take 1 tablet (40 mg total) by mouth daily. 45 tablet 5  . gabapentin (NEURONTIN) 300 MG capsule Take 1 capsule (300 mg total) by mouth 3 (three) times daily. 90 capsule 3  . hydrOXYzine (ATARAX/VISTARIL) 25 MG tablet TAKE 1 TO 2 TABLETS BY MOUTH TWICE A DAY AS NEEDED FOR ITCHING 90 tablet 0  . lactulose (CHRONULAC) 10 GM/15ML solution Take 2 tbsp by mouth 2 times a day 1892 mL 6  . levothyroxine (SYNTHROID, LEVOTHROID) 200 MCG tablet Take 1 tablet (200 mcg total) by mouth daily before breakfast. 90 tablet 3  . metroNIDAZOLE (METROGEL) 0.75 % gel Apply 1 application topically 2 (two) times daily. For 5 days 45 g 0  .  Multiple Vitamins-Minerals (CENTRUM SILVER ULTRA WOMENS PO) Take by mouth.    . Olopatadine HCl (PAZEO) 0.7 % SOLN Apply 1 drop to eye daily. 2.5 mL 1  . omeprazole (PRILOSEC) 20 MG capsule TAKE 1 CAPSULE (20 MG TOTAL) BY MOUTH DAILY. 30 capsule 5  . QUEtiapine (SEROQUEL) 25 MG tablet Take 1 tablet (25 mg total) by mouth at bedtime. 30 tablet 2  . ranitidine (ZANTAC) 300 MG tablet Take 1 tablet (300 mg total) by mouth at bedtime. 30 tablet 5  . rifaximin (XIFAXAN) 550 MG TABS tablet Take 1 tablet (550 mg total) by mouth 2 (two) times daily. 60 tablet 2  . spironolactone (ALDACTONE) 100 MG tablet Take 1 tablet (100 mg total) by mouth daily. 45 tablet 5  . traMADol (ULTRAM)  50 MG tablet Take 1-2 tablets (50-100 mg total) by mouth every 8 (eight) hours as needed (For pain.). 180 tablet 2  . traZODone (DESYREL) 50 MG tablet TAKE 1 TABLET (50 MG TOTAL) BY MOUTH AT BEDTIME AS NEEDED FOR SLEEP. 30 tablet 0  . loratadine (CLARITIN) 10 MG tablet TAKE 1 TABLET (10 MG TOTAL) BY MOUTH DAILY. (Patient not taking: Reported on 07/28/2016) 30 tablet 6  . meloxicam (MOBIC) 7.5 MG tablet TAKE 1 TABLET BY MOUTH EVERY DAY (Patient not taking: Reported on 07/28/2016) 30 tablet 1   No facility-administered medications prior to visit.    Past Medical History:  Diagnosis Date  . Allergic rhinitis   . Anxiety and depression   . Arthritis   . Blood transfusion   . Cataract    MILD  . Clotting disorder ([place])    prolonged clotting time due to liver disease  . Compression fracture 09/21/2013  . 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 Esophageal stricture    esophageal dysmotility and chronic dysphagia as well  . GERD (gastroesophageal reflux disease)   . Hepatic cirrhosis due to chronic hepatitis C infection ([place])    ETOH causative as well  . Hepatic encephalopathy syndrome ([place])   . Hiatal hernia   . History of alcohol abuse   . History of hip fracture   . HTN (hypertension)   . Osteoporosis   . 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 Paraesophageal hernia   . Paraesophageal hiatal hernia 10/17/2015  . 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 Ulcer ([place])    2007   Past Surgical History:  Procedure Laterality Date  . BURR HOLE FOR SUBDURAL HEMATOMA  2004  . COLONOSCOPY  08/2012   moderatel left colon tics  . ESOPHAGOGASTRODUODENOSCOPY (EGD) WITH PROPOFOL N/A 01/26/2014   Procedure: ESOPHAGOGASTRODUODENOSCOPY (EGD) WITH PROPOFOL;  Surgeon: Lafayette Dragon, MD;  Location: [facility] ENDOSCOPY;  Service: Endoscopy;  Laterality: N/A;  . KYPHOPLASTY Bilateral 09/21/2013   Procedure: T12 - L1  KYPHOPLASTY;  Surgeon: Melina Schools, MD;  Location: [facility];  Service: Orthopedics;  Laterality: Bilateral;  . NASAL SINUS SURGERY    . ORIF HIP FRACTURE  2010   right x2  . SPLENECTOMY     age 61  . UPPER GASTROINTESTINAL ENDOSCOPY  08/05/2007   esophageal ring, hiatal hernia, portal gastropathy  . UPPER GASTROINTESTINAL ENDOSCOPY  10/14/2011   Social History   Social History  . Marital status: Widowed    Spouse name: N/A  . Number of children: 2  . Years of education: 12+   Occupational History  . Disabled    Social History  Main Topics  . Smoking status: Current Every Day Smoker    Packs/day: 0.25    Years: 10.00    Types: Cigarettes    Last attempt to quit: 12/23/2008  . Smokeless tobacco: Never Used     Comment: reduce # of cigarettes  . Alcohol use No     Comment: history of attending AA  . Drug use: No  . Sexual activity: No   Other Topics Concern  . None   Social History Narrative   Just before her husband died, she had a miscarriage and started to drink beer heavily.  She quit secondary to her liver disease and has been quit for several years.  She denies the use of drugs - prescription or otherwise.     Lives with renters who help her   She does not drive   She has an associates degree.    Many of her bills are paid automatically by the bank.  Her SSI checks deposit directly into her banck account.    Family History  Problem Relation Age of Onset  . Heart disease Mother   . Anxiety disorder Mother   . Other Daughter     chromosome abnormalit  . Other Daughter     micropthalmia  . Cancer Maternal Grandmother   . Colon cancer Neg Hx        Review of Systems As above  Objective:   Physical Exam BP 136/82   Pulse 66   Ht 5\' 2"  (1.575 m)   Wt 190 lb (86.2 kg)   BMI 34.75 kg/m  Anicteric eyes Lungs cta Cor s1s2 abd distended suspect mod ascites Ext no edema Alert and oriented x 3 no asterixis

## 2016-07-28 NOTE — Telephone Encounter (Signed)
Called patient. No answer. Left message explaining to patient that referral was placed and to give more time for someone from PT to contact to schedule an appointment. Nat Christen, CMA

## 2016-07-28 NOTE — Addendum Note (Signed)
Addended by: Gerda Diss A on: 07/28/2016 07:33 AM   Modules accepted: Orders

## 2016-07-28 NOTE — Patient Instructions (Addendum)
   Per Dr Carlean Purl stop your Mobic.  You may take up to 2000mg  of tylenol daily for pain.    Your physician has requested that you go to the basement for the following lab work before leaving today: BMET   We will call you with results and plans.     I appreciate the opportunity to care for you. Silvano Rusk, MD, [facility]

## 2016-07-28 NOTE — Assessment & Plan Note (Signed)
Ardine Eng B 9 pts

## 2016-07-29 ENCOUNTER — Ambulatory Visit: Payer: Self-pay | Admitting: Family Medicine

## 2016-07-29 ENCOUNTER — Other Ambulatory Visit: Payer: Self-pay

## 2016-07-29 ENCOUNTER — Telehealth: Payer: Self-pay | Admitting: Internal Medicine

## 2016-07-29 DIAGNOSIS — R188 Other ascites: Secondary | ICD-10-CM

## 2016-07-29 NOTE — Telephone Encounter (Signed)
Pt just wanted to let you know she is having paracentesis done tomorrow. Please advise. Thanks! ep

## 2016-07-29 NOTE — Progress Notes (Signed)
Kidney fx is better Please schedule US guided paracentesis - remove  Up to 5 L IV albumin after Studies - cell count, culture, albumin

## 2016-07-30 ENCOUNTER — Other Ambulatory Visit: Payer: Self-pay | Admitting: Internal Medicine

## 2016-07-30 ENCOUNTER — Ambulatory Visit (HOSPITAL_COMMUNITY)
Admission: RE | Admit: 2016-07-30 | Discharge: 2016-07-30 | Disposition: A | Discharge status: Home / Self Care | Payer: Medicare Other | Source: Ambulatory Visit | Attending: Internal Medicine | Admitting: Internal Medicine

## 2016-07-30 DIAGNOSIS — R188 Other ascites: Secondary | ICD-10-CM | POA: Diagnosis not present

## 2016-07-30 MED ORDER — LIDOCAINE HCL (PF) 1 % IJ SOLN
INTRAMUSCULAR | Status: AC
  Filled 2016-07-30: qty 10

## 2016-07-30 NOTE — Progress Notes (Signed)
abdominal distention not from ascites Doubt I can do anything more for that  Have her do a BMET in 1 month please

## 2016-07-30 NOTE — Progress Notes (Signed)
Patient ID: Wendy [name], female   DOB: 12-May-1961, 55 y.o.   MRN: [id number]   Limited US Abd was performed  Very small pocket of fluid in RLQ No other ascites identified  Small pocket in RLQ was partially obstructed with floating bowel No safe window to access small pocket  No paracentesis performed today

## 2016-07-31 ENCOUNTER — Other Ambulatory Visit: Payer: Self-pay

## 2016-07-31 ENCOUNTER — Ambulatory Visit: Payer: Self-pay | Admitting: Neurology

## 2016-07-31 DIAGNOSIS — K7469 Other cirrhosis of liver: Secondary | ICD-10-CM

## 2016-08-04 ENCOUNTER — Ambulatory Visit (INDEPENDENT_AMBULATORY_CARE_PROVIDER_SITE_OTHER): Payer: Medicare Other | Admitting: Allergy and Immunology

## 2016-08-04 ENCOUNTER — Encounter: Payer: Self-pay | Admitting: Allergy and Immunology

## 2016-08-04 VITALS — BP 114/78 | HR 60 | Temp 99.3°F | Resp 18 | Ht 61.89 in | Wt 200.8 lb

## 2016-08-04 DIAGNOSIS — J3089 Other allergic rhinitis: Secondary | ICD-10-CM

## 2016-08-04 DIAGNOSIS — R51 Headache: Secondary | ICD-10-CM | POA: Diagnosis not present

## 2016-08-04 DIAGNOSIS — F17210 Nicotine dependence, cigarettes, uncomplicated: Secondary | ICD-10-CM

## 2016-08-04 DIAGNOSIS — R519 Headache, unspecified: Secondary | ICD-10-CM

## 2016-08-04 MED ORDER — MONTELUKAST SODIUM 10 MG PO TABS: 10.0000 mg | ORAL_TABLET | Freq: Every day | ORAL | 5 refills | Status: DC

## 2016-08-04 NOTE — Progress Notes (Signed)
Dear Dr. Avon Gully,  Thank you for referring Wendy [name] to the [facility] on 08/04/2016.   Below is a summation of this patient's evaluation and recommendations.  Thank you for your referral. I will keep you informed about this patient's response to treatment.   If you have any questions please do not hesitate to contact me.   Sincerely,  Jiles Prows, MD [facility]   ______________________________________________________________________    NEW PATIENT NOTE  Referring Provider: [place] Primary Provider: Adin Hector, MD Date of office visit: 08/04/2016    Subjective:   Chief Complaint:  Wendy [name] (DOB: 03-Jun-1961) is a 55 y.o. female who presents to the clinic on 08/04/2016 with a chief complaint of Allergic Rhinitis  and Itchy Eye .     HPI: Wendy [name] presents to this clinic in evaluation of 2 main issues.  First, she has a long history of nasal congestion and sneezing and intermittent sore throat and ear itching and watery eyes of many years duration occurring on a perennial basis with possible trigger of locating to her bedroom. She previously had evaluation with allergist several years ago and was skin tested but apparently that skin testing was performed with the use of an antihistamine. She does have anosmia and cannot really smell food across the room but has no history of ugly nasal discharge. She had an MRI of her head in January 2016 which did not identify any evidence of chronic sinusitis.  Second, she has headaches. She has daily headaches in her frontal region usually occurring in the afternoon the last several hours and sometimes lasts until she goes to bed. She will get spots in her vision with these headaches but she has no associated nausea or other neurological symptoms. She does not treat this issue. It should be noted that she  drinks diet Dr. peppers 4 times per day and also occasionally has tea. She does not drink alcohol or consume chocolate.  Past Medical History:  Diagnosis Date  . Allergic rhinitis   . Anxiety and depression   . Arthritis   . Blood transfusion   . Cataract    MILD  . Clotting disorder ([place])    prolonged clotting time due to liver disease  . Compression fracture 09/21/2013  . 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 Esophageal stricture    esophageal dysmotility and chronic dysphagia as well  . GERD (gastroesophageal reflux disease)   . Hepatic cirrhosis due to chronic hepatitis C infection ([place])    ETOH causative as well  . Hepatic encephalopathy syndrome ([place])   . Hiatal hernia   . History of alcohol abuse   . History of hip fracture   . HTN (hypertension)   . Osteoporosis   . 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 Paraesophageal hernia   . Paraesophageal hiatal hernia 10/17/2015  . 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 Ulcer ([place])    2007    Past Surgical History:  Procedure Laterality Date  . BURR HOLE FOR SUBDURAL HEMATOMA  2004  . COLONOSCOPY  08/2012   moderatel left colon tics  . ESOPHAGOGASTRODUODENOSCOPY (EGD) WITH PROPOFOL N/A 01/26/2014   Procedure: ESOPHAGOGASTRODUODENOSCOPY (EGD) WITH PROPOFOL;  Surgeon: Lafayette Dragon, MD;  Location: [facility] ENDOSCOPY;  Service: Endoscopy;  Laterality: N/A;  . KYPHOPLASTY Bilateral  09/21/2013   Procedure: T12 - L1 KYPHOPLASTY;  Surgeon: Melina Schools, MD;  Location: [facility];  Service: Orthopedics;  Laterality: Bilateral;  . NASAL SINUS SURGERY    . ORIF HIP FRACTURE  2010   right x2  . SPLENECTOMY     age 60  . UPPER GASTROINTESTINAL ENDOSCOPY  08/05/2007   esophageal ring, hiatal hernia, portal gastropathy  . UPPER GASTROINTESTINAL ENDOSCOPY  10/14/2011      Medication List      alendronate 70 MG tablet Commonly known as:   FOSAMAX Take 1 tablet (70 mg total) by mouth once a week.   aluminum hydroxide-magnesium carbonate 95-358 MG/15ML Susp Commonly known as:  GAVISCON Take 30 mLs by mouth at bedtime.   Biotin 1000 MCG tablet Take 1,000 mcg by mouth 3 (three) times daily.   bisacodyl 5 MG EC tablet Commonly known as:  CVS GENTLE LAXATIVE TAKE 1 TABLET (5 MG TOTAL) BY MOUTH DAILY.   carvedilol 12.5 MG tablet Commonly known as:  COREG Take 1 tablet (12.5 mg total) by mouth 2 (two) times daily with a meal.   CENTRUM SILVER ULTRA WOMENS PO Take by mouth.   clonazePAM 1 MG tablet Commonly known as:  KLONOPIN Take 0.5-1 tablets (0.5-1 mg total) by mouth 2 (two) times daily as needed for anxiety.   cycloSPORINE 0.05 % ophthalmic emulsion Commonly known as:  RESTASIS Place 1 drop into both eyes 2 (two) times daily.   docusate sodium 100 MG capsule Commonly known as:  COLACE Take 1 capsule (100 mg total) by mouth 2 (two) times daily.   escitalopram 10 MG tablet Commonly known as:  LEXAPRO Take 1.5 tablets (15 mg total) by mouth daily. One time refill only.   fluticasone 50 MCG/ACT nasal spray Commonly known as:  FLONASE Place 2 sprays into both nostrils daily.   furosemide 40 MG tablet Commonly known as:  LASIX Take 1 tablet (40 mg total) by mouth daily.   gabapentin 300 MG capsule Commonly known as:  NEURONTIN Take 1 capsule (300 mg total) by mouth 3 (three) times daily.   hydrOXYzine 25 MG tablet Commonly known as:  ATARAX/VISTARIL TAKE 1 TO 2 TABLETS BY MOUTH TWICE A DAY AS NEEDED FOR ITCHING   lactulose 10 GM/15ML solution Commonly known as:  CHRONULAC Take 2 tbsp by mouth 2 times a day   levothyroxine 200 MCG tablet Commonly known as:  SYNTHROID, LEVOTHROID Take 1 tablet (200 mcg total) by mouth daily before breakfast.   loratadine 10 MG tablet Commonly known as:  CLARITIN TAKE 1 TABLET (10 MG TOTAL) BY MOUTH DAILY.   meloxicam 7.5 MG tablet Commonly known as:  MOBIC TAKE 1  TABLET BY MOUTH EVERY DAY   metroNIDAZOLE 0.75 % gel Commonly known as:  METROGEL Apply 1 application topically 2 (two) times daily. For 5 days   Olopatadine HCl 0.7 % Soln Commonly known as:  PAZEO Apply 1 drop to eye daily.   omeprazole 20 MG capsule Commonly known as:  PRILOSEC TAKE 1 CAPSULE (20 MG TOTAL) BY MOUTH DAILY.   QUEtiapine 25 MG tablet Commonly known as:  SEROQUEL Take 1 tablet (25 mg total) by mouth at bedtime.   ranitidine 300 MG tablet Commonly known as:  ZANTAC Take 1 tablet (300 mg total) by mouth at bedtime.   rifaximin 550 MG Tabs tablet Commonly known as:  XIFAXAN Take 1 tablet (550 mg total) by mouth 2 (two) times daily.   spironolactone 100 MG tablet Commonly known as:  ALDACTONE Take 1 tablet (100 mg total) by mouth daily.   traMADol 50 MG tablet Commonly known as:  ULTRAM Take 1-2 tablets (50-100 mg total) by mouth every 8 (eight) hours as needed (For pain.).   traZODone 50 MG tablet Commonly known as:  DESYREL TAKE 1 TABLET (50 MG TOTAL) BY MOUTH AT BEDTIME AS NEEDED FOR SLEEP.   Vitamin D3 2000 units capsule Take 2,000 Units by mouth daily.       Allergies  Allergen Reactions  . Codeine Phosphate Itching  . Codeine Rash    Review of systems negative except as noted in HPI / PMHx or noted below:  Review of Systems  Constitutional: Negative.   HENT: Negative.   Eyes: Negative.   Respiratory: Negative.   Cardiovascular: Negative.   Gastrointestinal: Negative.   Genitourinary: Negative.   Musculoskeletal: Negative.   Skin: Negative.   Neurological: Negative.   Endo/Heme/Allergies: Negative.   Psychiatric/Behavioral: Negative.     Family History  Problem Relation Age of Onset  . Heart disease Mother   . Anxiety disorder Mother   . Other Daughter     chromosome abnormalit  . Other Daughter     micropthalmia  . Cancer Maternal Grandmother   . Kidney failure Sister   . Colon cancer Neg Hx     Social History    Social History  . Marital status: Widowed    Spouse name: N/A  . Number of children: 2  . Years of education: 12+   Occupational History  . Disabled    Social History Main Topics  . Smoking status: Current Every Day Smoker    Packs/day: 0.25    Years: 10.00    Types: Cigarettes  . Smokeless tobacco: Never Used     Comment: reduce # of cigarettes  . Alcohol use No     Comment: history of attending AA  . Drug use: No  . Sexual activity: No   Other Topics Concern  . Not on file   Social History Narrative   Just before her husband died, she had a miscarriage and started to drink beer heavily.  She quit secondary to her liver disease and has been quit for several years.  She denies the use of drugs - prescription or otherwise.     Lives with renters who help her   She does not drive   She has an associates degree.    Many of her bills are paid automatically by the bank.  Her SSI checks deposit directly into her banck account.     Environmental and Social history  Lives in a house with a dry environment, dogs located inside the household, carpeting in the bedroom, no plastic on the bed or pillow, and smoking occurring with inside the household.  Objective:   Vitals:   08/04/16 1439  BP: 114/78  Pulse: 60  Resp: 18  Temp: 99.3 F (37.4 C)   Height: 5' 1.89" (157.2 cm) Weight: 200 lb 12.8 oz (91.1 kg)  Physical Exam  Constitutional: She is well-developed, well-nourished, and in no distress.  HENT:  Head: Normocephalic.  Right Ear: Tympanic membrane, external ear and ear canal normal.  Left Ear: Tympanic membrane, external ear and ear canal normal.  Nose: Mucosal edema present. No rhinorrhea.  Mouth/Throat: Uvula is midline, oropharynx is clear and moist and mucous membranes are normal. No oropharyngeal exudate.  Eyes: Conjunctivae are normal.  Neck: Trachea normal. No tracheal tenderness present. No tracheal deviation present. No thyromegaly present.  Cardiovascular: Normal rate, regular rhythm, S1 normal, S2 normal and normal heart sounds.   No murmur heard. Pulmonary/Chest: Breath sounds normal. No stridor. No respiratory distress. She has no wheezes. She has no rales.  Musculoskeletal: She exhibits no edema.  Lymphadenopathy:       Head (right side): No tonsillar adenopathy present.       Head (left side): No tonsillar adenopathy present.    She has no cervical adenopathy.  Neurological: She is alert. Gait normal.  Skin: No rash noted. She is not diaphoretic. No erythema. Nails show no clubbing.  Psychiatric: Mood and affect normal.    Diagnostics: Allergy skin tests were performed. She did not demonstrate any hypersensitivity to a screening panel of aeroallergens or foods  Assessment and Plan:    1. Other allergic rhinitis   2. Chronic daily headache   3. Light tobacco smoker <10 cigarettes per day     1. Allergen avoidance measures and find substitutes for tobacco smoke exposure  2. Treat and prevent inflammation:   A. OTC Rhinocort one spray each nostril one time per day  B. montelukast 10 mg tablet 1 time per day  3. If needed:   A. nasal saline spray multiple times a day  B. loratadine 10 mg tablet 1 time a day  4. Slowly taper off all forms of caffeine including diet Dr. Samson Frederic and tea to help with chronic headache issue.  5. Return to clinic in 4 weeks or earlier if problem  Wendy [name] will consist use anti-inflammatory medications to eliminate any inflammation of her upper airways and I've encouraged her to eliminate exposure to tobacco smoke by picking a nicotine substitute to replace her cigarettes. Hopefully this combination of therapy will result in resolution of her upper airway symptoms and return of her ability to smell. As well, I've asked her to taper down on her rather significant caffeine use as this is probably contributing to her headaches. She has several reasons have headaches including the  possibility of posttraumatic headache but I think that caffeine is also contributing to this situation. We'll see what type of response we get in approximately 4 weeks.  Jiles Prows, MD [facility]

## 2016-08-04 NOTE — Patient Instructions (Addendum)
  1. Allergen avoidance measures and find substitutes for tobacco smoke exposure  2. Treat and prevent inflammation:   A. OTC Rhinocort one spray each nostril one time per day  B. montelukast 10 mg tablet 1 time per day  3. If needed:   A. nasal saline spray multiple times a day  B. loratadine 10 mg tablet 1 time a day  4. Slowly taper off all forms of caffeine including diet Dr. Samson Frederic and tea to help with chronic headache issue.  5. Return to clinic in 4 weeks or earlier if problem

## 2016-08-05 ENCOUNTER — Ambulatory Visit: Payer: Self-pay | Admitting: Family Medicine

## 2016-08-06 ENCOUNTER — Other Ambulatory Visit: Payer: Self-pay | Admitting: Internal Medicine

## 2016-08-06 ENCOUNTER — Telehealth: Payer: Self-pay | Admitting: Internal Medicine

## 2016-08-06 ENCOUNTER — Telehealth: Payer: Self-pay

## 2016-08-06 NOTE — Telephone Encounter (Signed)
Then there is really nothing more we can do at this time.  I think it would be a good idea if her friend checks things out in her home (such as if she is taking medications correctly).  She should also be careful and take her time when standing up and walking.  I would like to order her a 4-prong cane to use when walking.

## 2016-08-06 NOTE — Telephone Encounter (Signed)
Have been attempting to get patient home health. Received a message from Danny Lawless at [facility] that stated, "  Just wanted to let you know that due to [facility]  being an open liability case I have been unable  to find an agency that accepts these type of  cases. Sorry.  Let me know if there is anything  I can do."  Please advise.

## 2016-08-06 NOTE — Telephone Encounter (Signed)
Patient notified that weight has been fluctuating due to adjustments in her diuretics.  She is advised that she needs to remain on her current dose of diuretics  Furosemide 40 mg and spironolactone 100 mg daily.  She is due for labs right after the first of the year.  She will call back for any additional questions or concerns.

## 2016-08-07 DIAGNOSIS — F329 Major depressive disorder, single episode, unspecified: Secondary | ICD-10-CM | POA: Diagnosis not present

## 2016-08-07 DIAGNOSIS — Z720 Tobacco use: Secondary | ICD-10-CM | POA: Diagnosis not present

## 2016-08-07 DIAGNOSIS — M810 Age-related osteoporosis without current pathological fracture: Secondary | ICD-10-CM | POA: Diagnosis not present

## 2016-08-07 DIAGNOSIS — M1990 Unspecified osteoarthritis, unspecified site: Secondary | ICD-10-CM | POA: Diagnosis not present

## 2016-08-07 DIAGNOSIS — F4001 Agoraphobia with panic disorder: Secondary | ICD-10-CM | POA: Diagnosis not present

## 2016-08-07 DIAGNOSIS — B1920 Unspecified viral hepatitis C without hepatic coma: Secondary | ICD-10-CM | POA: Diagnosis not present

## 2016-08-07 DIAGNOSIS — E039 Hypothyroidism, unspecified: Secondary | ICD-10-CM | POA: Diagnosis not present

## 2016-08-07 DIAGNOSIS — S069X0S Unspecified intracranial injury without loss of consciousness, sequela: Secondary | ICD-10-CM | POA: Diagnosis not present

## 2016-08-07 DIAGNOSIS — F1011 Alcohol abuse, in remission: Secondary | ICD-10-CM | POA: Diagnosis not present

## 2016-08-07 DIAGNOSIS — K7030 Alcoholic cirrhosis of liver without ascites: Secondary | ICD-10-CM | POA: Diagnosis not present

## 2016-08-07 DIAGNOSIS — G4733 Obstructive sleep apnea (adult) (pediatric): Secondary | ICD-10-CM | POA: Diagnosis not present

## 2016-08-07 DIAGNOSIS — I10 Essential (primary) hypertension: Secondary | ICD-10-CM | POA: Diagnosis not present

## 2016-08-07 DIAGNOSIS — F0390 Unspecified dementia without behavioral disturbance: Secondary | ICD-10-CM | POA: Diagnosis not present

## 2016-08-07 DIAGNOSIS — R296 Repeated falls: Secondary | ICD-10-CM | POA: Diagnosis not present

## 2016-08-08 ENCOUNTER — Other Ambulatory Visit: Payer: Self-pay | Admitting: Internal Medicine

## 2016-08-08 DIAGNOSIS — R6889 Other general symptoms and signs: Secondary | ICD-10-CM

## 2016-08-11 DIAGNOSIS — M5136 Other intervertebral disc degeneration, lumbar region: Secondary | ICD-10-CM | POA: Diagnosis not present

## 2016-08-12 DIAGNOSIS — S069X0S Unspecified intracranial injury without loss of consciousness, sequela: Secondary | ICD-10-CM | POA: Diagnosis not present

## 2016-08-12 DIAGNOSIS — F0390 Unspecified dementia without behavioral disturbance: Secondary | ICD-10-CM | POA: Diagnosis not present

## 2016-08-12 DIAGNOSIS — K7030 Alcoholic cirrhosis of liver without ascites: Secondary | ICD-10-CM | POA: Diagnosis not present

## 2016-08-12 DIAGNOSIS — I10 Essential (primary) hypertension: Secondary | ICD-10-CM | POA: Diagnosis not present

## 2016-08-12 DIAGNOSIS — F4001 Agoraphobia with panic disorder: Secondary | ICD-10-CM | POA: Diagnosis not present

## 2016-08-12 DIAGNOSIS — B1920 Unspecified viral hepatitis C without hepatic coma: Secondary | ICD-10-CM | POA: Diagnosis not present

## 2016-08-14 DIAGNOSIS — F0390 Unspecified dementia without behavioral disturbance: Secondary | ICD-10-CM | POA: Diagnosis not present

## 2016-08-14 DIAGNOSIS — I10 Essential (primary) hypertension: Secondary | ICD-10-CM | POA: Diagnosis not present

## 2016-08-14 DIAGNOSIS — S069X0S Unspecified intracranial injury without loss of consciousness, sequela: Secondary | ICD-10-CM | POA: Diagnosis not present

## 2016-08-14 DIAGNOSIS — B1920 Unspecified viral hepatitis C without hepatic coma: Secondary | ICD-10-CM | POA: Diagnosis not present

## 2016-08-14 DIAGNOSIS — F4001 Agoraphobia with panic disorder: Secondary | ICD-10-CM | POA: Diagnosis not present

## 2016-08-14 DIAGNOSIS — K7030 Alcoholic cirrhosis of liver without ascites: Secondary | ICD-10-CM | POA: Diagnosis not present

## 2016-08-18 ENCOUNTER — Telehealth: Payer: Self-pay | Admitting: Neurology

## 2016-08-18 NOTE — Telephone Encounter (Signed)
Gina from [facility] called and stated that the patient canceled ALL visit for the week and she just wanted [place] to know. If you need to cal her back please call [phone]

## 2016-08-18 NOTE — Telephone Encounter (Signed)
Dr Jaffe *FYI* 

## 2016-08-20 ENCOUNTER — Other Ambulatory Visit: Payer: Self-pay | Admitting: Internal Medicine

## 2016-08-20 DIAGNOSIS — L299 Pruritus, unspecified: Secondary | ICD-10-CM

## 2016-08-25 DIAGNOSIS — F0390 Unspecified dementia without behavioral disturbance: Secondary | ICD-10-CM | POA: Diagnosis not present

## 2016-08-25 DIAGNOSIS — S069X0S Unspecified intracranial injury without loss of consciousness, sequela: Secondary | ICD-10-CM | POA: Diagnosis not present

## 2016-08-25 DIAGNOSIS — K7030 Alcoholic cirrhosis of liver without ascites: Secondary | ICD-10-CM | POA: Diagnosis not present

## 2016-08-25 DIAGNOSIS — B1920 Unspecified viral hepatitis C without hepatic coma: Secondary | ICD-10-CM | POA: Diagnosis not present

## 2016-08-25 DIAGNOSIS — I10 Essential (primary) hypertension: Secondary | ICD-10-CM | POA: Diagnosis not present

## 2016-08-25 DIAGNOSIS — F4001 Agoraphobia with panic disorder: Secondary | ICD-10-CM | POA: Diagnosis not present

## 2016-08-26 ENCOUNTER — Other Ambulatory Visit: Payer: Self-pay | Admitting: Internal Medicine

## 2016-08-26 NOTE — Telephone Encounter (Signed)
Pt needs a refill on tramadol. ep

## 2016-08-27 ENCOUNTER — Encounter: Payer: Self-pay | Admitting: Internal Medicine

## 2016-08-27 ENCOUNTER — Ambulatory Visit: Payer: Self-pay | Admitting: Internal Medicine

## 2016-08-27 ENCOUNTER — Ambulatory Visit (INDEPENDENT_AMBULATORY_CARE_PROVIDER_SITE_OTHER): Payer: Medicare Other | Admitting: Internal Medicine

## 2016-08-27 VITALS — BP 114/70 | HR 65 | Temp 98.2°F | Wt 213.0 lb

## 2016-08-27 DIAGNOSIS — R6889 Other general symptoms and signs: Secondary | ICD-10-CM | POA: Diagnosis not present

## 2016-08-27 DIAGNOSIS — M549 Dorsalgia, unspecified: Secondary | ICD-10-CM

## 2016-08-27 DIAGNOSIS — R202 Paresthesia of skin: Secondary | ICD-10-CM | POA: Diagnosis not present

## 2016-08-27 DIAGNOSIS — Z789 Other specified health status: Secondary | ICD-10-CM | POA: Diagnosis not present

## 2016-08-27 DIAGNOSIS — M545 Low back pain, unspecified: Secondary | ICD-10-CM

## 2016-08-27 DIAGNOSIS — F331 Major depressive disorder, recurrent, moderate: Secondary | ICD-10-CM | POA: Diagnosis not present

## 2016-08-27 DIAGNOSIS — I10 Essential (primary) hypertension: Secondary | ICD-10-CM

## 2016-08-27 DIAGNOSIS — E559 Vitamin D deficiency, unspecified: Secondary | ICD-10-CM

## 2016-08-27 DIAGNOSIS — E038 Other specified hypothyroidism: Secondary | ICD-10-CM

## 2016-08-27 DIAGNOSIS — G4719 Other hypersomnia: Secondary | ICD-10-CM | POA: Diagnosis not present

## 2016-08-27 DIAGNOSIS — R5382 Chronic fatigue, unspecified: Secondary | ICD-10-CM

## 2016-08-27 DIAGNOSIS — G8929 Other chronic pain: Secondary | ICD-10-CM

## 2016-08-27 DIAGNOSIS — W19XXXD Unspecified fall, subsequent encounter: Secondary | ICD-10-CM

## 2016-08-27 LAB — BASIC METABOLIC PANEL WITH GFR
BUN: 15 mg/dL (ref 7–25)
CALCIUM: 8.4 mg/dL — AB (ref 8.6–10.4)
CO2: 26 mmol/L (ref 20–31)
Chloride: 103 mmol/L (ref 98–110)
Creat: 0.93 mg/dL (ref 0.50–1.05)
GFR, EST AFRICAN AMERICAN: 79 mL/min (ref >=60)
GFR, EST NON AFRICAN AMERICAN: 69 mL/min (ref >=60)
Glucose, Bld: 129 mg/dL — ABNORMAL HIGH (ref 65–99)
POTASSIUM: 4.1 mmol/L (ref 3.5–5.3)
SODIUM: 137 mmol/L (ref 135–146)

## 2016-08-27 LAB — CBC
HCT: 35.1 % (ref 35.0–45.0)
Hemoglobin: 11.6 g/dL — ABNORMAL LOW (ref 11.7–15.5)
MCH: 38.3 pg — ABNORMAL HIGH (ref 27.0–33.0)
MCHC: 33 g/dL (ref 32.0–36.0)
MCV: 115.8 fL — ABNORMAL HIGH (ref 80.0–100.0)
MPV: 11.3 fL (ref 7.5–12.5)
Platelets: 232 10*3/uL (ref 140–400)
RBC: 3.03 MIL/uL — AB (ref 3.80–5.10)
RDW: 14.7 % (ref 11.0–15.0)
WBC: 7.9 10*3/uL (ref 3.8–10.8)

## 2016-08-27 LAB — TSH: TSH: 2.77 mIU/L

## 2016-08-27 MED ORDER — TRAMADOL HCL 50 MG PO TABS: 50.0000 mg | ORAL_TABLET | Freq: Two times a day (BID) | ORAL | 2 refills | Status: DC

## 2016-08-27 MED ORDER — TRAZODONE HCL 50 MG PO TABS: 25.0000 mg | ORAL_TABLET | Freq: Every evening | ORAL | 0 refills | Status: DC | PRN

## 2016-08-27 NOTE — Patient Instructions (Addendum)
It was nice seeing you again today Ms. Apolinar!  For your swelling, please increase your Lasix to 80mg  every other day, with 40mg  in between. We are measuring your kidney function today and will be sure to continue to monitor it at your follow-up visits.   For your lack of energy, we are remeasuring your thyroid hormone level today to see if we need to change the dose of Synthroid. We are also measuring your hemoglobin today to make sure you are not anemic. The multiple sedating medications you are on are also likely contributing to your tiredness. Because of this, we are decreasing your trazodone to 25mg  every night. You can continue to take the Seroquel as prescribed by your psychiatrist.   For your leg cramps, we are measuring your potassium to make sure that is not the cause. Again, the multiple medications you are on can also cause symptoms like this and may be contributing.   For your depression and anxiety, please continue to take the medications you are currently prescribed. For counseling, you can contact [facility] at [phone] to schedule an appointment.   I will see you back in two weeks to make sure your swelling has improved.   If you have any questions or concerns, please feel free to call the clinic.   Be well,  Dr. Avon Gully

## 2016-08-27 NOTE — Assessment & Plan Note (Signed)
Possible cause or contributing factor to patient's fatigue. Currently taking 136mcg Synthroid. Last TSH 8.08 on 10/26.  - Repeat TSH today - Adjust Synthroid dose accordingly

## 2016-08-27 NOTE — Progress Notes (Signed)
Subjective:   Patient: Wendy [name]       Birthdate: May 03, 1961       MRN: [id number]      HPI  Wendy [name] is a 56 y.o. female presenting for [facility] clinic.   Weight fluctuation Patient reports bloated feeling in abdomen only. Has been evaluated by her GI Dr. Carlean Purl for this, who suspected possible ascites. [place] was performed which showed only minimal fluid. Patient's Lasix was increased from 40mg  qd to 80mg  qd by Dr. Carlean Purl, however her creatinine increased, so she was decreased back to 40mg  qd, which is what she is currently taking. Patient also currently taking spironolactone 100mg  qd. Denies any swelling in extremities, hands, or feet. Endorses worsening SOB since onset of weight gain. Says she is not eating more than usual or more unhealthy foods than usual.   Lack of energy Chronic issue which patient and I have discussed before. Her Synthroid was increased to 130mcg on 10/26 after TSH was 8, but she reports no improvement in symptoms since then. Patient says she does feel rested when she awakes in the morning but not energetic per se, however she reports she has never really felt energetic. She is now taking Seroquel nightly as prescribed by her psychiatrist which she says helps a lot. She is still taking trazodone nightly. She is hesitant to discontinue trazodone as she feels that Seroquel alone may not be helpful.   Leg/back pain Patient reports significant improvement in neuropathic leg pain after beginning gabapentin. She denies the spidery sensations she was experiencing before. Is still endorsing her usual chronic back pain. She is currently taking typically two tramadol per day, some days three. Her friend that she lives with who controls her medications will not allow her to take more than three a day, and encourages her to take only two. She is no longer taking Mobic. Her GI told her she can take Tylenol, however she has not tried this yet. She has an appointment with Dr.  Nelva Bush in two weeks for some type of injection, however she is unsure if it is an epidural injection or steroid.   Muscle twitches Primarily in her legs. Describes it as a "muscle jerk." Not painful, just feels "weird." Patient reports that you cannot see the twitching. Occurs during the day and night, however does not wake her from sleep. Occurs when she is both standing, seated, and laying down.   Depression/anxiety Patient followed by Dr. De Nurse with [facility]. She is currently taking Seroquel and Lexapro which she thinks help some. She is particularly struggling with social anxiety and agoraphobia, which is why she says she cancels many of her appointments. She says it will often take her up to two weeks to get the courage to go to the grocery store. She takes Klonopin for her anxiety which helps some. On days when she has to leave the house, she takes 1-2 Klonopin. She does not take it daily. She has another appointment with her psychiatrist this month. She was previously trying to set up a counseling appointment, but the agency she was working with would not accept her insurance. She is very interested in some type of counseling, as she thinks this would be very beneficial to her.   Smoking status reviewed. Patient currently smoking three cigarettes per day. She had stopped smoking altogether before Christmas, but had a particularly stressful holiday season and began smoking again. She has set a new quit date of next Friday. She  uses a phone hotline for counseling and support.   Review of Systems See HPI.     Objective:  Physical Exam  Constitutional: She is oriented to person, place, and time and well-developed, well-nourished, and in no distress.  HENT:  Head: Normocephalic and atraumatic.  Eyes: Conjunctivae and EOM are normal. Right eye exhibits no discharge. Left eye exhibits no discharge.  Cardiovascular: Normal rate, regular rhythm and normal heart sounds.   No murmur  heard. 1-2+ LE edema to midshin R>L. No swelling of hands/arms.   Pulmonary/Chest: Effort normal and breath sounds normal. No respiratory distress. She has no wheezes.  Abdominal: Soft. Bowel sounds are normal. She exhibits distension. She exhibits no mass. There is no tenderness. There is no rebound and no guarding.  Musculoskeletal:  Slow gait with walker  Neurological: She is alert and oriented to person, place, and time.  Skin: Skin is warm and dry.  Psychiatric: Affect and judgment normal.   Assessment & Plan:  Hypothyroidism Possible cause or contributing factor to patient's fatigue. Currently taking 138mcg Synthroid. Last TSH 8.08 on 10/26.  - Repeat TSH today - Adjust Synthroid dose accordingly  Depression, major, recurrent, moderate ([address]) Followed by Dr. De Nurse at [facility] since 12/2014. Currently taking Seroquel, Lexapro, and Klonopin. Seroquel and Lexapro managed by Dr. De Nurse; Klonopin currently managed by [facility].  - Continue medications as currently prescribed - Patient given contact information for [facility] as she would like to schedule a counseling appointment   Back pain Chronic. Currently taking Tramadol 2-3 tabs daily. Has been cleared to take Tylenol by Dr. Carlean Purl (GI). No longer taking Mobic or any other NSAIDs. Scheduled for injection with Dr. Nelva Bush in about two weeks.  - Refilled prescription for Tramadol 50mg  BID #60 with 2 refills - Keep appt for injections with Dr. Nelva Bush  Excessive daytime sleepiness Suspect multiple sedating medications paired with decreased hepatic clearance resulting in persistent fatigue. Patient now taking both trazodone and Seroquel before bed. Discussed likelihood of medication combo causing daytime sleepiness and recommendation for beginning to wean trazodone. Patient in agreement.  - Begin to wean trazodone to 25mg  - refill today written for trazodone 50mg  one-half tablet QHS PRN #15 with no refills  Paresthesia of lower  extremity Much improved with gabapentin 300mg  TID. Muscle twitching likely dystonic reaction 2/2 one of patient's medications.  - Continue gabapentin 300mg  TID  Fluctuation of weight Primarily abdominal bloating. [place] ordered by patient's GI Dr. Carlean Purl which showed minimal fluid, thus ruling out ascites as cause. Patient with 1-2+ LE edema L>R in office today, suggesting that peripheral edema may be contributing as well. Weight also up to 213lb today, significant increase from one month ago. Given history of sharp creatinine rise with doubling Lasix to 80mg  qd, will increase more gradually.  - Begin Lasix 80mg  qod with 40mg  in between - BMP today - F/u in two weeks to check weight and recheck BMP for Cr monitoring   Adin Hector, MD, MPH PGY-2 [facility] Pager [phone]

## 2016-08-27 NOTE — Assessment & Plan Note (Addendum)
Much improved with gabapentin 300mg  TID. Muscle twitching likely dystonic reaction 2/2 one of patient's medications.  - Continue gabapentin 300mg  TID

## 2016-08-27 NOTE — Assessment & Plan Note (Signed)
Suspect multiple sedating medications paired with decreased hepatic clearance resulting in persistent fatigue. Patient now taking both trazodone and Seroquel before bed. Discussed likelihood of medication combo causing daytime sleepiness and recommendation for beginning to wean trazodone. Patient in agreement.  - Begin to wean trazodone to 25mg  - refill today written for trazodone 50mg  one-half tablet QHS PRN #15 with no refills

## 2016-08-27 NOTE — Assessment & Plan Note (Signed)
Followed by Dr. De Nurse at [facility] since 12/2014. Currently taking Seroquel, Lexapro, and Klonopin. Seroquel and Lexapro managed by Dr. De Nurse; Klonopin currently managed by [facility].  - Continue medications as currently prescribed - Patient given contact information for [facility] as she would like to schedule a counseling appointment

## 2016-08-27 NOTE — Assessment & Plan Note (Signed)
Primarily abdominal bloating. [place] ordered by patient's GI Dr. Carlean Purl which showed minimal fluid, thus ruling out ascites as cause. Patient with 1-2+ LE edema L>R in office today, suggesting that peripheral edema may be contributing as well. Weight also up to 213lb today, significant increase from one month ago. Given history of sharp creatinine rise with doubling Lasix to 80mg  qd, will increase more gradually.  - Begin Lasix 80mg  qod with 40mg  in between - BMP today - F/u in two weeks to check weight and recheck BMP for Cr monitoring

## 2016-08-27 NOTE — Assessment & Plan Note (Signed)
Chronic. Currently taking Tramadol 2-3 tabs daily. Has been cleared to take Tylenol by Dr. Carlean Purl (GI). No longer taking Mobic or any other NSAIDs. Scheduled for injection with Dr. Nelva Bush in about two weeks.  - Refilled prescription for Tramadol 50mg  BID #60 with 2 refills - Keep appt for injections with Dr. Nelva Bush

## 2016-08-28 ENCOUNTER — Telehealth: Payer: Self-pay | Admitting: Internal Medicine

## 2016-08-28 LAB — VITAMIN D 25 HYDROXY (VIT D DEFICIENCY, FRACTURES): VIT D 25 HYDROXY: 44 ng/mL (ref 30–100)

## 2016-08-28 NOTE — Telephone Encounter (Signed)
Pt called because she has a missed call from [place]. I didn't see any notes. jw

## 2016-08-31 DIAGNOSIS — S069X0S Unspecified intracranial injury without loss of consciousness, sequela: Secondary | ICD-10-CM | POA: Diagnosis not present

## 2016-08-31 DIAGNOSIS — F4001 Agoraphobia with panic disorder: Secondary | ICD-10-CM | POA: Diagnosis not present

## 2016-08-31 DIAGNOSIS — I10 Essential (primary) hypertension: Secondary | ICD-10-CM | POA: Diagnosis not present

## 2016-08-31 DIAGNOSIS — B1920 Unspecified viral hepatitis C without hepatic coma: Secondary | ICD-10-CM | POA: Diagnosis not present

## 2016-08-31 DIAGNOSIS — F0390 Unspecified dementia without behavioral disturbance: Secondary | ICD-10-CM | POA: Diagnosis not present

## 2016-08-31 DIAGNOSIS — K7030 Alcoholic cirrhosis of liver without ascites: Secondary | ICD-10-CM | POA: Diagnosis not present

## 2016-09-01 ENCOUNTER — Telehealth: Payer: Self-pay | Admitting: Internal Medicine

## 2016-09-01 NOTE — Telephone Encounter (Signed)
Pt would like to try omega q plus reveratrol. Pt saw it in a magazine and thinks it would be a good idea to try. Pt also states she has never received a phone call about a cough. Please advise. ep

## 2016-09-03 ENCOUNTER — Telehealth: Payer: Self-pay | Admitting: Internal Medicine

## 2016-09-03 NOTE — Telephone Encounter (Signed)
Patient notified of recommendations. 

## 2016-09-03 NOTE — Telephone Encounter (Signed)
Returned patient's call regarding Omega Q. No answer. If she calls back, she is safe to take this medication. I did not call her about a cough because she never discussed coughing with me at our last visit.   Adin Hector, MD, MPH PGY-2 [facility] Pager [phone]

## 2016-09-03 NOTE — Telephone Encounter (Signed)
Left message for patient to call back  

## 2016-09-03 NOTE — Telephone Encounter (Signed)
Pt states she has been having trouble swallowing for about a month. Reports she has to hold her head way back to get things to go down. She states this is with solids and her meds, liquids seem to go down ok. Please advise.

## 2016-09-03 NOTE — Telephone Encounter (Signed)
Pt wants to know if she can take Omega Q plus Resveratrol

## 2016-09-03 NOTE — Telephone Encounter (Signed)
Swallowing problems are chronic from hiatal hernia We cannot fix that as it would require surgery  I don't think a supplement she is asking about is worth the $ and do not recommend it

## 2016-09-04 ENCOUNTER — Telehealth: Payer: Self-pay | Admitting: Internal Medicine

## 2016-09-04 DIAGNOSIS — F4001 Agoraphobia with panic disorder: Secondary | ICD-10-CM | POA: Diagnosis not present

## 2016-09-04 DIAGNOSIS — I10 Essential (primary) hypertension: Secondary | ICD-10-CM | POA: Diagnosis not present

## 2016-09-04 DIAGNOSIS — F0390 Unspecified dementia without behavioral disturbance: Secondary | ICD-10-CM | POA: Diagnosis not present

## 2016-09-04 DIAGNOSIS — B1920 Unspecified viral hepatitis C without hepatic coma: Secondary | ICD-10-CM | POA: Diagnosis not present

## 2016-09-04 DIAGNOSIS — K7030 Alcoholic cirrhosis of liver without ascites: Secondary | ICD-10-CM | POA: Diagnosis not present

## 2016-09-04 DIAGNOSIS — S069X0S Unspecified intracranial injury without loss of consciousness, sequela: Secondary | ICD-10-CM | POA: Diagnosis not present

## 2016-09-04 NOTE — Telephone Encounter (Signed)
FYI Sir 

## 2016-09-07 ENCOUNTER — Other Ambulatory Visit: Payer: Self-pay | Admitting: Internal Medicine

## 2016-09-08 ENCOUNTER — Ambulatory Visit: Payer: Self-pay | Admitting: Allergy and Immunology

## 2016-09-08 DIAGNOSIS — F4001 Agoraphobia with panic disorder: Secondary | ICD-10-CM | POA: Diagnosis not present

## 2016-09-08 DIAGNOSIS — S069X0S Unspecified intracranial injury without loss of consciousness, sequela: Secondary | ICD-10-CM | POA: Diagnosis not present

## 2016-09-08 DIAGNOSIS — I10 Essential (primary) hypertension: Secondary | ICD-10-CM | POA: Diagnosis not present

## 2016-09-08 DIAGNOSIS — F0390 Unspecified dementia without behavioral disturbance: Secondary | ICD-10-CM | POA: Diagnosis not present

## 2016-09-08 DIAGNOSIS — B1920 Unspecified viral hepatitis C without hepatic coma: Secondary | ICD-10-CM | POA: Diagnosis not present

## 2016-09-08 DIAGNOSIS — K7030 Alcoholic cirrhosis of liver without ascites: Secondary | ICD-10-CM | POA: Diagnosis not present

## 2016-09-10 ENCOUNTER — Ambulatory Visit: Payer: Self-pay | Admitting: Internal Medicine

## 2016-09-10 ENCOUNTER — Ambulatory Visit (HOSPITAL_COMMUNITY): Payer: Self-pay | Admitting: Psychiatry

## 2016-09-11 DIAGNOSIS — B1920 Unspecified viral hepatitis C without hepatic coma: Secondary | ICD-10-CM | POA: Diagnosis not present

## 2016-09-11 DIAGNOSIS — I10 Essential (primary) hypertension: Secondary | ICD-10-CM | POA: Diagnosis not present

## 2016-09-11 DIAGNOSIS — F4001 Agoraphobia with panic disorder: Secondary | ICD-10-CM | POA: Diagnosis not present

## 2016-09-11 DIAGNOSIS — F0390 Unspecified dementia without behavioral disturbance: Secondary | ICD-10-CM | POA: Diagnosis not present

## 2016-09-11 DIAGNOSIS — S069X0S Unspecified intracranial injury without loss of consciousness, sequela: Secondary | ICD-10-CM | POA: Diagnosis not present

## 2016-09-11 DIAGNOSIS — K7030 Alcoholic cirrhosis of liver without ascites: Secondary | ICD-10-CM | POA: Diagnosis not present

## 2016-09-14 ENCOUNTER — Other Ambulatory Visit (HOSPITAL_COMMUNITY): Payer: Self-pay | Admitting: Psychiatry

## 2016-09-14 DIAGNOSIS — I10 Essential (primary) hypertension: Secondary | ICD-10-CM | POA: Diagnosis not present

## 2016-09-14 DIAGNOSIS — B1920 Unspecified viral hepatitis C without hepatic coma: Secondary | ICD-10-CM | POA: Diagnosis not present

## 2016-09-14 DIAGNOSIS — S069X0S Unspecified intracranial injury without loss of consciousness, sequela: Secondary | ICD-10-CM | POA: Diagnosis not present

## 2016-09-14 DIAGNOSIS — F0390 Unspecified dementia without behavioral disturbance: Secondary | ICD-10-CM | POA: Diagnosis not present

## 2016-09-14 DIAGNOSIS — F331 Major depressive disorder, recurrent, moderate: Secondary | ICD-10-CM

## 2016-09-14 DIAGNOSIS — K7030 Alcoholic cirrhosis of liver without ascites: Secondary | ICD-10-CM | POA: Diagnosis not present

## 2016-09-14 DIAGNOSIS — F4001 Agoraphobia with panic disorder: Secondary | ICD-10-CM | POA: Diagnosis not present

## 2016-09-16 DIAGNOSIS — I10 Essential (primary) hypertension: Secondary | ICD-10-CM | POA: Diagnosis not present

## 2016-09-16 DIAGNOSIS — F4001 Agoraphobia with panic disorder: Secondary | ICD-10-CM | POA: Diagnosis not present

## 2016-09-16 DIAGNOSIS — B1920 Unspecified viral hepatitis C without hepatic coma: Secondary | ICD-10-CM | POA: Diagnosis not present

## 2016-09-16 DIAGNOSIS — S069X0S Unspecified intracranial injury without loss of consciousness, sequela: Secondary | ICD-10-CM | POA: Diagnosis not present

## 2016-09-16 DIAGNOSIS — K7030 Alcoholic cirrhosis of liver without ascites: Secondary | ICD-10-CM | POA: Diagnosis not present

## 2016-09-16 DIAGNOSIS — F0390 Unspecified dementia without behavioral disturbance: Secondary | ICD-10-CM | POA: Diagnosis not present

## 2016-09-16 NOTE — Telephone Encounter (Signed)
Medication refill-  Received fax from [place] from [place] requesting refills for Lexapro and Seroquel. Per Dr. De Nurse, refills are authorize for Lexapro 10mg , #45 and Seroquel 25mg , #30. Prescriptions were sent to pharmacy. Pt's next apt is schedule on 09/24/16. Called and informed pt of refill status. Pt verbalizes understanding.

## 2016-09-18 ENCOUNTER — Other Ambulatory Visit: Payer: Self-pay | Admitting: Internal Medicine

## 2016-09-18 ENCOUNTER — Telehealth: Payer: Self-pay | Admitting: Internal Medicine

## 2016-09-18 ENCOUNTER — Other Ambulatory Visit: Payer: Self-pay | Admitting: *Deleted

## 2016-09-18 DIAGNOSIS — F4001 Agoraphobia with panic disorder: Secondary | ICD-10-CM

## 2016-09-18 NOTE — Telephone Encounter (Signed)
Pt states she called billing and they were unable to help her so she is hoping we can or point her in the right direction. Pt has a shot and and an office visit that they say is filed under [employer] comp, but pt has never had workman's comp and doesn't know what to do. Please advise. ep

## 2016-09-21 NOTE — Telephone Encounter (Signed)
Pt is calling for a refill on her trazodone to be called in. jw

## 2016-09-21 NOTE — Telephone Encounter (Signed)
We have no idea about billing issues on our end. Wendy [name] can you point patient in the right direction?

## 2016-09-22 DIAGNOSIS — B1920 Unspecified viral hepatitis C without hepatic coma: Secondary | ICD-10-CM | POA: Diagnosis not present

## 2016-09-22 DIAGNOSIS — F4001 Agoraphobia with panic disorder: Secondary | ICD-10-CM | POA: Diagnosis not present

## 2016-09-22 DIAGNOSIS — K7030 Alcoholic cirrhosis of liver without ascites: Secondary | ICD-10-CM | POA: Diagnosis not present

## 2016-09-22 DIAGNOSIS — S069X0S Unspecified intracranial injury without loss of consciousness, sequela: Secondary | ICD-10-CM | POA: Diagnosis not present

## 2016-09-22 DIAGNOSIS — I10 Essential (primary) hypertension: Secondary | ICD-10-CM | POA: Diagnosis not present

## 2016-09-22 DIAGNOSIS — F0390 Unspecified dementia without behavioral disturbance: Secondary | ICD-10-CM | POA: Diagnosis not present

## 2016-09-22 MED ORDER — CLONAZEPAM 1 MG PO TABS: 0.5000 mg | ORAL_TABLET | Freq: Two times a day (BID) | ORAL | 2 refills | Status: DC | PRN

## 2016-09-22 NOTE — Telephone Encounter (Signed)
Patient has been advised of Dr. Justus [facility] msg.

## 2016-09-23 NOTE — Telephone Encounter (Signed)
Left message on voicemail that rx was ready to be picked up. 

## 2016-09-24 ENCOUNTER — Ambulatory Visit (INDEPENDENT_AMBULATORY_CARE_PROVIDER_SITE_OTHER): Payer: Medicare Other | Admitting: Psychiatry

## 2016-09-24 ENCOUNTER — Ambulatory Visit: Payer: Self-pay | Admitting: Internal Medicine

## 2016-09-24 ENCOUNTER — Encounter (HOSPITAL_COMMUNITY): Payer: Self-pay | Admitting: Psychiatry

## 2016-09-24 VITALS — BP 126/70 | HR 87 | Resp 16 | Ht 62.0 in | Wt 202.0 lb

## 2016-09-24 DIAGNOSIS — Z888 Allergy status to other drugs, medicaments and biological substances status: Secondary | ICD-10-CM

## 2016-09-24 DIAGNOSIS — F411 Generalized anxiety disorder: Secondary | ICD-10-CM | POA: Diagnosis not present

## 2016-09-24 DIAGNOSIS — Z841 Family history of disorders of kidney and ureter: Secondary | ICD-10-CM | POA: Diagnosis not present

## 2016-09-24 DIAGNOSIS — F331 Major depressive disorder, recurrent, moderate: Secondary | ICD-10-CM | POA: Diagnosis not present

## 2016-09-24 DIAGNOSIS — F0630 Mood disorder due to known physiological condition, unspecified: Secondary | ICD-10-CM | POA: Diagnosis not present

## 2016-09-24 DIAGNOSIS — F410 Panic disorder [episodic paroxysmal anxiety] without agoraphobia: Secondary | ICD-10-CM | POA: Diagnosis not present

## 2016-09-24 DIAGNOSIS — Z818 Family history of other mental and behavioral disorders: Secondary | ICD-10-CM

## 2016-09-24 DIAGNOSIS — Z79899 Other long term (current) drug therapy: Secondary | ICD-10-CM | POA: Diagnosis not present

## 2016-09-24 DIAGNOSIS — Z8249 Family history of ischemic heart disease and other diseases of the circulatory system: Secondary | ICD-10-CM

## 2016-09-24 DIAGNOSIS — Z808 Family history of malignant neoplasm of other organs or systems: Secondary | ICD-10-CM | POA: Diagnosis not present

## 2016-09-24 MED ORDER — QUETIAPINE FUMARATE 25 MG PO TABS: 25.0000 mg | ORAL_TABLET | Freq: Every day | ORAL | 3 refills | Status: DC

## 2016-09-24 MED ORDER — ESCITALOPRAM OXALATE 10 MG PO TABS: 15.0000 mg | ORAL_TABLET | Freq: Every day | ORAL | 3 refills | Status: DC
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
## 2016-09-24 NOTE — Telephone Encounter (Signed)
Pt would like to get information about permanent disability. Please advise

## 2016-09-24 NOTE — Telephone Encounter (Signed)
Pt was unable to make appointment and states she is out of this medication and needs it. Pt's spouse (I believe) got on the phone and was frustrated because he states this medication hasn't been filled since Dec. They would like Dr. Avon Gully to call to discuss this. ep

## 2016-09-25 NOTE — Telephone Encounter (Signed)
Pt called again and would like Dr. Avon Gully to call her back about the trazodone refill. ep

## 2016-09-25 NOTE — Telephone Encounter (Signed)
Left message on voicemail informing patient that she would need to be seen for refill on trazodone and that we do NOT do disabilty at this office. Patient will need to go through the social security office to get this process started.

## 2016-09-28 ENCOUNTER — Other Ambulatory Visit: Payer: Self-pay | Admitting: Internal Medicine

## 2016-09-28 ENCOUNTER — Ambulatory Visit: Payer: Self-pay | Admitting: Internal Medicine

## 2016-09-28 MED ORDER — TRAZODONE HCL 50 MG PO TABS: 25.0000 mg | ORAL_TABLET | Freq: Every evening | ORAL | 0 refills | Status: DC | PRN

## 2016-09-28 NOTE — Progress Notes (Signed)
No answer. Left message for patient to call office back. Nat Christen, CMA

## 2016-09-29 DIAGNOSIS — F0390 Unspecified dementia without behavioral disturbance: Secondary | ICD-10-CM | POA: Diagnosis not present

## 2016-09-29 DIAGNOSIS — I10 Essential (primary) hypertension: Secondary | ICD-10-CM | POA: Diagnosis not present

## 2016-09-29 DIAGNOSIS — K7030 Alcoholic cirrhosis of liver without ascites: Secondary | ICD-10-CM | POA: Diagnosis not present

## 2016-09-29 DIAGNOSIS — B1920 Unspecified viral hepatitis C without hepatic coma: Secondary | ICD-10-CM | POA: Diagnosis not present

## 2016-09-29 DIAGNOSIS — S069X0S Unspecified intracranial injury without loss of consciousness, sequela: Secondary | ICD-10-CM | POA: Diagnosis not present

## 2016-09-29 DIAGNOSIS — F4001 Agoraphobia with panic disorder: Secondary | ICD-10-CM | POA: Diagnosis not present

## 2016-09-30 DIAGNOSIS — I10 Essential (primary) hypertension: Secondary | ICD-10-CM | POA: Diagnosis not present

## 2016-09-30 DIAGNOSIS — B1920 Unspecified viral hepatitis C without hepatic coma: Secondary | ICD-10-CM | POA: Diagnosis not present

## 2016-09-30 DIAGNOSIS — F0390 Unspecified dementia without behavioral disturbance: Secondary | ICD-10-CM | POA: Diagnosis not present

## 2016-09-30 DIAGNOSIS — K7030 Alcoholic cirrhosis of liver without ascites: Secondary | ICD-10-CM | POA: Diagnosis not present

## 2016-09-30 DIAGNOSIS — S069X0S Unspecified intracranial injury without loss of consciousness, sequela: Secondary | ICD-10-CM | POA: Diagnosis not present

## 2016-09-30 DIAGNOSIS — F4001 Agoraphobia with panic disorder: Secondary | ICD-10-CM | POA: Diagnosis not present

## 2016-10-02 ENCOUNTER — Ambulatory Visit (INDEPENDENT_AMBULATORY_CARE_PROVIDER_SITE_OTHER): Payer: Medicare Other | Admitting: Family Medicine

## 2016-10-02 ENCOUNTER — Encounter: Payer: Self-pay | Admitting: Family Medicine

## 2016-10-02 VITALS — BP 126/72 | HR 64 | Temp 97.9°F | Ht 62.0 in | Wt 205.0 lb

## 2016-10-02 DIAGNOSIS — F331 Major depressive disorder, recurrent, moderate: Secondary | ICD-10-CM

## 2016-10-02 NOTE — Patient Instructions (Signed)
It was nice meeting you! You were seen in clinic today to discuss how you have been feeling lately. You stated that you have been told by a close friend that you don't make sense at times when speaking.  I feel as though anything acute or life threatening has been ruled out based on our visit today. However I do think it would be of great benefit to you to follow up with our [facility] to discuss these issues further.  Your appointment has been made by Dr. Gwenlyn Saran for Feb 20th at [facility]. I think it would help to bring your friend in for this visit if possible so that she can shed some light as to how she has noticed your speech./behavior may be changing.   In the meantime you can continue to take your medications as prescribed by Dr. De Nurse.  If you have any questions, please call.    Lovenia Kim, MD

## 2016-10-02 NOTE — Progress Notes (Signed)
   Subjective:   Patient ID: Wendy [name]    DOB: 1960-12-09, 56 y.o. female   MRN: [id number]  CC:  Not making sense when she talks  HPI: Wendy [name] is a 56 y.o. female with PMH of cirrhosis, GAD, MDD, hypothyroidism who presents to clinic today because she was told by a concerned friend that she does not make sense when she talks.  She presents to clinic alone today.  Reports mild tremors at home but they are not there all the time.  For about a week now her close friend has noticed that she does not seem like herself over the phone.  Patient reports her friend's husband has also noticed the same about her.  She endorses being able to recognize people and places.  States she is oriented at home and that she does not notice that she is behaving differently or saying anything she normally would not say.    She states she has a doctor in behavioral med that she sees (Dr. De Nurse) in [place].  Takes Lexapro 15 mg, seroquel and Trazodone for sleep.  Has been tolerating meds well.    ROS: See HPI for pertinent ROS.   Smoking status reviewed. Current smoker. 3 cigs/day.   Medications reviewed.  Objective:   BP 126/72   Pulse 64   Temp 97.9 F (36.6 C) (Oral)   Ht 5\' 2"  (1.575 m)   Wt 205 lb (93 kg)   BMI 37.49 kg/m  Vitals and nursing note reviewed.   General: 56 yo female, well nourished, well developed, in no acute distress HEENT: NCAT, MMM Neck: supple CV: RRR no MRG  Lungs: clear B/L with normal work of breathing  Abdomen: soft, NTND, no ascites present,  +bs Skin: warm, dry Extremities: no asterixis, warm and well perfused, normal tone Neuro: Alert, orientedx3, no focal deficits, finger to nose intact  Psych: normal affect, speech normal, linear thought process  Assessment & Plan:   56 yo female with h/o cirrhosis, GAD, MDD, and hypothyroidism with concerns for recent changes in speech.    With her history of liver cirrhosis, concern for hepatic  encephalopathy.  However patient is alert,oriented and answering questions appropriately on exam.  No asterixis present and neuro exam normal.  She does not exhibit any signs of encephalopathy.  Her speech symptoms are likely secondary to mood disorder/underlying psych conditions.   Discussed with patient and she states she would like to have someone to talk to about her concerns.   Patient with underlying psych conditions of GAD and MDD which could likely be contributing.  Sees Dr. De Nurse in [place] where she is prescribed Seroquel, Trazodone and Lexapro.  -Discussed with Dr. Gwenlyn Saran and agree that she would benefit being seen here -No changes to psych medications made at this time  -Recommended it may be helpful to have her friend accompany her to the visit as she may be able to shed light on the changes she has noticed about patient -Scheduled to follow up with [facility] this upcoming week  Follow up : with [facility] on 2/20  Lovenia Kim, MD [place], PGY-1 10/05/2016 12:53 AM

## 2016-10-03 ENCOUNTER — Telehealth: Payer: Self-pay | Admitting: Family Medicine

## 2016-10-03 NOTE — Telephone Encounter (Signed)
Received call from patient on after hours. Wendy [name] that she had "a lot" of clear fluid leaking from her belly button since this morning. Fluid is nonbloody. No fevers, chills, or abdominal pain. Advised patient that she should go to urgent care of the ED for further evaluation.  Algis Greenhouse. Jerline Pain, [place] Resident PGY-3 10/03/2016 2:19 PM

## 2016-10-04 ENCOUNTER — Ambulatory Visit (HOSPITAL_COMMUNITY)
Admission: EM | Admit: 2016-10-04 | Discharge: 2016-10-04 | Disposition: A | Discharge status: Home / Self Care | Payer: Medicare Other | Attending: Internal Medicine | Admitting: Internal Medicine

## 2016-10-04 ENCOUNTER — Encounter (HOSPITAL_COMMUNITY): Payer: Self-pay | Admitting: Emergency Medicine

## 2016-10-04 DIAGNOSIS — Z79899 Other long term (current) drug therapy: Secondary | ICD-10-CM | POA: Insufficient documentation

## 2016-10-04 DIAGNOSIS — Q899 Congenital malformation, unspecified: Secondary | ICD-10-CM

## 2016-10-04 DIAGNOSIS — F17210 Nicotine dependence, cigarettes, uncomplicated: Secondary | ICD-10-CM | POA: Diagnosis not present

## 2016-10-04 DIAGNOSIS — R109 Unspecified abdominal pain: Secondary | ICD-10-CM | POA: Insufficient documentation

## 2016-10-04 MED ORDER — CIPROFLOXACIN HCL 500 MG PO TABS
500.0000 mg | ORAL_TABLET | Freq: Two times a day (BID) | ORAL | 0 refills | Status: DC
Start: 2016-10-04 — End: 2017-01-25

## 2016-10-04 MED ORDER — METRONIDAZOLE 500 MG PO TABS: 500.0000 mg | ORAL_TABLET | Freq: Two times a day (BID) | ORAL | 0 refills | Status: DC

## 2016-10-04 NOTE — ED Triage Notes (Signed)
The patient presented to the [facility] with a complaint of "my belly buttion is leaking clear fluid" that started yesterday am.

## 2016-10-04 NOTE — Discharge Instructions (Signed)
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 He will need to follow-up with your primary care doctor tomorrow or the next day and likely have an ultrasound to diagnose the problem. In the meantime he will be started on antibiotics because sometimes this gets infected. He can apply warm compresses for comfort. And also keep the dressing over it to catch the liquid. If you develop worsening pain, fever or other problems prior to seeing her doctor go directly to the emergency department.

## 2016-10-04 NOTE — ED Provider Notes (Signed)
CSN: [id number]     Arrival date & time 10/04/16  1431 History   First MD Initiated Contact with Patient 10/04/16 1724     Chief Complaint  Patient presents with  . Abdominal Pain   (Consider location/radiation/quality/duration/timing/severity/associated sxs/prior Treatment) 56 year old female presents to the urgent care with a complaint of a leaky bellybutton. She noticed yesterday that there was some fluid coming from her abdomen and it took a little while for her to discover that it was actually coming from the umbilicus. She is not complaining of abdominal pain but states there is minor soreness around the umbilicus. Denies systemic symptoms including fever.      Past Medical History:  Diagnosis Date  . Allergic rhinitis   . Anxiety and depression   . Arthritis   . Blood transfusion   . Cataract    MILD  . Clotting disorder ([place])    prolonged clotting time due to liver disease  . Compression fracture 09/21/2013  . 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 Esophageal stricture    esophageal dysmotility and chronic dysphagia as well  . GERD (gastroesophageal reflux disease)   . Hepatic cirrhosis due to chronic hepatitis C infection ([place])    ETOH causative as well  . Hepatic encephalopathy syndrome ([place])   . Hiatal hernia   . History of alcohol abuse   . History of hip fracture   . HTN (hypertension)   . Osteoporosis   . 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 Paraesophageal hernia   . Paraesophageal hiatal hernia 10/17/2015  . 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 Ulcer ([place])    2007   Past Surgical History:  Procedure Laterality Date  . BURR HOLE FOR SUBDURAL HEMATOMA  2004  . COLONOSCOPY  08/2012   moderatel left colon tics  . ESOPHAGOGASTRODUODENOSCOPY (EGD) WITH PROPOFOL N/A 01/26/2014   Procedure: ESOPHAGOGASTRODUODENOSCOPY (EGD) WITH PROPOFOL;  Surgeon: Lafayette Dragon,  MD;  Location: [facility] ENDOSCOPY;  Service: Endoscopy;  Laterality: N/A;  . KYPHOPLASTY Bilateral 09/21/2013   Procedure: T12 - L1 KYPHOPLASTY;  Surgeon: Melina Schools, MD;  Location: [facility];  Service: Orthopedics;  Laterality: Bilateral;  . NASAL SINUS SURGERY    . ORIF HIP FRACTURE  2010   right x2  . SPLENECTOMY     age 60  . UPPER GASTROINTESTINAL ENDOSCOPY  08/05/2007   esophageal ring, hiatal hernia, portal gastropathy  . UPPER GASTROINTESTINAL ENDOSCOPY  10/14/2011   Family History  Problem Relation Age of Onset  . Heart disease Mother   . Anxiety disorder Mother   . Other Daughter     chromosome abnormalit  . Other Daughter     micropthalmia  . Cancer Maternal Grandmother   . Kidney failure Sister   . Colon cancer Neg Hx    Social History  Substance Use Topics  . Smoking status: Current Every Day Smoker    Packs/day: 0.25    Years: 10.00    Types: Cigarettes  . Smokeless tobacco: Never Used     Comment: reduce # of cigarettes  . Alcohol use No     Comment: history of attending AA   OB History    No data available     Review of Systems  Constitutional: Negative.   Gastrointestinal: Negative for abdominal distention, blood in stool, nausea, rectal pain and vomiting.  Genitourinary: Negative.   Neurological: Negative.   All other systems reviewed and  are negative.   Allergies  Codeine phosphate and Codeine  Home Medications   Prior to Admission medications   Medication Sig Start Date End Date Taking? Authorizing Provider  alendronate (FOSAMAX) 70 MG tablet Take 1 tablet (70 mg total) by mouth once a week. 02/04/16   Patrecia Pour, MD  aluminum hydroxide-magnesium carbonate (GAVISCON) 95-358 MG/15ML SUSP Take 30 mLs by mouth at bedtime. 04/23/16   Gatha Mayer, MD  Biotin 1000 MCG tablet Take 1,000 mcg by mouth 3 (three) times daily.    Historical Provider, MD  bisacodyl ([facility] GENTLE LAXATIVE) 5 MG EC tablet TAKE 1 TABLET (5 MG TOTAL) BY MOUTH DAILY. Patient taking  differently: daily as needed. TAKE 1 TABLET (5 MG TOTAL) BY MOUTH DAILY. 10/26/14   Olin Hauser, DO  carvedilol (COREG) 12.5 MG tablet Take 1 tablet (12.5 mg total) by mouth 2 (two) times daily with a meal. 06/09/16   Verner Mould, MD  Cholecalciferol (VITAMIN D3) 2000 UNITS capsule Take 2,000 Units by mouth daily.    Historical Provider, MD  ciprofloxacin (CIPRO) 500 MG tablet Take 1 tablet (500 mg total) by mouth 2 (two) times daily. 10/04/16   Janne Napoleon, NP  clonazePAM (KLONOPIN) 1 MG tablet Take 0.5-1 tablets (0.5-1 mg total) by mouth 2 (two) times daily as needed for anxiety. 09/22/16   Verner Mould, MD  cycloSPORINE (RESTASIS) 0.05 % ophthalmic emulsion Place 1 drop into both eyes 2 (two) times daily. 04/28/16   Hillary Corinda Gubler, MD  docusate sodium (COLACE) 100 MG capsule TAKE ONE CAPSULE BY MOUTH TWICE A DAY 09/10/16   Verner Mould, MD  escitalopram (LEXAPRO) 10 MG tablet Take 1.5 tablets (15 mg total) by mouth daily. One time refill only. 09/24/16   Merian Capron, MD  fluticasone (FLONASE) 50 MCG/ACT nasal spray Place 2 sprays into both nostrils daily. 04/28/16   Hillary Corinda Gubler, MD  furosemide (LASIX) 40 MG tablet Take 1 tablet (40 mg total) by mouth daily. 07/24/16   Gatha Mayer, MD  gabapentin (NEURONTIN) 300 MG capsule Take 1 capsule (300 mg total) by mouth 3 (three) times daily. 07/23/16   Verner Mould, MD  hydrOXYzine (ATARAX/VISTARIL) 25 MG tablet TAKE 1 TO 2 TABLETS BY MOUTH TWICE A DAY AS NEEDED FOR ITCHING 08/20/16   Verner Mould, MD  lactulose [facility]) 10 GM/15ML solution Take 2 tbsp by mouth 2 times a day 04/28/16   Gatha Mayer, MD  levothyroxine (SYNTHROID, LEVOTHROID) 200 MCG tablet Take 1 tablet (200 mcg total) by mouth daily before breakfast. 06/22/16   Verner Mould, MD  loratadine (CLARITIN) 10 MG tablet TAKE 1 TABLET (10 MG TOTAL) BY MOUTH DAILY. 04/07/16   Verner Mould,  MD  meloxicam (MOBIC) 7.5 MG tablet TAKE 1 TABLET BY MOUTH EVERY DAY 06/12/16   Verner Mould, MD  metroNIDAZOLE (FLAGYL) 500 MG tablet Take 1 tablet (500 mg total) by mouth 2 (two) times daily. X 7 days 10/04/16   Janne Napoleon, NP  metroNIDAZOLE (METROGEL) 0.75 % gel Apply 1 application topically 2 (two) times daily. For 5 days 05/04/16   Mercy Riding, MD  montelukast (SINGULAIR) 10 MG tablet Take 1 tablet (10 mg total) by mouth daily. 08/04/16   Jiles Prows, MD  Multiple Vitamins-Minerals (CENTRUM SILVER ULTRA WOMENS PO) Take by mouth.    Historical Provider, MD  omeprazole (PRILOSEC) 20 MG capsule TAKE 1 CAPSULE (20 MG TOTAL) BY MOUTH DAILY. 05/15/16  Verner Mould, MD  PAZEO 0.7 % SOLN INSTILL 1 DROP INTO AFFECTED EYE EVERY DAY 08/10/16   Verner Mould, MD  QUEtiapine (SEROQUEL) 25 MG tablet Take 1 tablet (25 mg total) by mouth at bedtime. 09/24/16   Merian Capron, MD  ranitidine (ZANTAC) 300 MG tablet Take 1 tablet (300 mg total) by mouth at bedtime. 06/12/16   Gatha Mayer, MD  rifaximin (XIFAXAN) 550 MG TABS tablet Take 1 tablet (550 mg total) by mouth 2 (two) times daily. 06/18/15   Gatha Mayer, MD  spironolactone (ALDACTONE) 100 MG tablet Take 1 tablet (100 mg total) by mouth daily. 07/24/16   Gatha Mayer, MD  traMADol (ULTRAM) 50 MG tablet Take 1 tablet (50 mg total) by mouth 2 (two) times daily. 08/27/16   Verner Mould, MD  traZODone (DESYREL) 50 MG tablet Take 0.5 tablets (25 mg total) by mouth at bedtime as needed for sleep. 09/28/16   Verner Mould, MD   Meds Ordered and Administered this Visit  Medications - No data to display  BP 101/62 (BP Location: Right Arm)   Pulse 61   Temp 98.1 F (36.7 C) (Oral)   Resp 16   SpO2 95%  No data found.   Physical Exam  Constitutional: She is oriented to person, place, and time. She appears well-developed and well-nourished. No distress.  HENT:  Head: Atraumatic.  Right Ear:  External ear normal.  Neck: Neck supple.  Pulmonary/Chest: Effort normal.  Abdominal: Soft. She exhibits no distension.  Abdomen obese, soft and nontender. No evidence of yeast infection. No erythema or lymphangitis. No localized tenderness. There is a clear fluid that continues to ooze drop by drop from the center portion of the umbilicus.  Neurological: She is alert and oriented to person, place, and time.  Skin: Skin is warm and dry.  Psychiatric: She has a normal mood and affect.  Nursing note and vitals reviewed.   Urgent Care Course         Procedures (including critical care time)  Labs Review Labs Reviewed - No data to display  Imaging Review No results found.   Visual Acuity Review  Right Eye Distance:   Left Eye Distance:   Bilateral Distance:    Right Eye Near:   Left Eye Near:    Bilateral Near:         MDM   1. 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 He will need to follow-up with your primary care doctor tomorrow or the next day and likely have an ultrasound to diagnose the problem. In the meantime he will be started on antibiotics because sometimes this gets infected. He can apply warm compresses for comfort. And also keep the dressing over it to catch the liquid. If you develop worsening pain, fever or other problems prior to seeing her doctor go directly to the emergency department. Meds ordered this encounter  Medications  . ciprofloxacin (CIPRO) 500 MG tablet    Sig: Take 1 tablet (500 mg total) by mouth 2 (two) times daily.    Dispense:  14 tablet    Refill:  0    Order Specific Question:   Supervising Provider    Answer:   Sherlene Shams [id number]  . metroNIDAZOLE (FLAGYL) 500 MG tablet    Sig: Take 1 tablet (500 mg total) by mouth 2 (two) times daily. X 7 days    Dispense:  14 tablet    Refill:  0    Order Specific Question:   Supervising Provider    Answer:   Sherlene Shams  [id number]       Janne Napoleon, NP 10/04/16 [phone]

## 2016-10-05 ENCOUNTER — Telehealth: Payer: Self-pay | Admitting: Internal Medicine

## 2016-10-05 ENCOUNTER — Other Ambulatory Visit: Payer: Self-pay | Admitting: Internal Medicine

## 2016-10-05 DIAGNOSIS — L299 Pruritus, unspecified: Secondary | ICD-10-CM

## 2016-10-05 NOTE — Telephone Encounter (Signed)
Patient seen at the urgent care for Umbilical abnormality. She would like the PCP to review the notes and advise her on what she needs to do next. Please advise.

## 2016-10-06 LAB — AEROBIC CULTURE  (SUPERFICIAL SPECIMEN): CULTURE: NORMAL

## 2016-10-06 LAB — AEROBIC CULTURE W GRAM STAIN (SUPERFICIAL SPECIMEN)

## 2016-10-07 ENCOUNTER — Ambulatory Visit: Payer: Self-pay | Admitting: Family Medicine

## 2016-10-08 ENCOUNTER — Ambulatory Visit (INDEPENDENT_AMBULATORY_CARE_PROVIDER_SITE_OTHER): Payer: Medicare Other | Admitting: Family Medicine

## 2016-10-08 ENCOUNTER — Encounter: Payer: Self-pay | Admitting: Family Medicine

## 2016-10-08 VITALS — BP 124/68 | HR 63 | Temp 98.5°F | Wt 205.6 lb

## 2016-10-08 DIAGNOSIS — Q899 Congenital malformation, unspecified: Secondary | ICD-10-CM | POA: Diagnosis present

## 2016-10-08 NOTE — Patient Instructions (Signed)
Thank you so much for coming to visit today! We will obtain an ultrasound of your belly button. After we obtain these results, we will determine if a referral to general surgery or urology is warranted. Please return if you notice increasing redness, warmth, or fevers.   Dr. Gerlean Ren

## 2016-10-09 ENCOUNTER — Telehealth: Payer: Self-pay | Admitting: Internal Medicine

## 2016-10-09 NOTE — Telephone Encounter (Signed)
lmovm for pt to return call. Hortence Charter Dawn, CMA  

## 2016-10-09 NOTE — Telephone Encounter (Signed)
Please let patient know that if her symptoms persist, she should schedule an appointment. It is important to stay hydrated, so if she notices signs of dehydration (dry mouth, increased thirst), or begins to have vomiting and cannot keep food or water down, she should schedule an appointment. Thanks! - AJL

## 2016-10-09 NOTE — Telephone Encounter (Signed)
Pt states she was to let PCP know of any changes in her condition. Pt is now having loose stools every time she goes to the bathroom. ep

## 2016-10-11 NOTE — Progress Notes (Signed)
Subjective:     Patient ID: Wendy [name], female   DOB: [birth year], 56 y.o.   MRN: [id number]  HPI Mrs. Wendy [name] is a 56yo female presenting today for "leaky umbilicus." Previously evaluated at Urgent Care, where they suspected a Urachal Cyst. Was given course of Ciprofloxacin and Flagyl, which she has been taking. First noted symptoms this past weekend when she noticed the front of her gown getting wet. Notes the leaking stopped today. Drainage was a clear color and had a slight odor, although it did not smell of urine. Does not notice any warmth or redness to area. Nontender. Denies fever. No changes in urine output or bowel movements. Smoker.  Review of Systems Per HPI    Objective:   Physical Exam  Constitutional: She appears well-developed and well-nourished. No distress.  Cardiovascular: Normal rate and regular rhythm.   No murmur heard. Pulmonary/Chest: Effort normal. No respiratory distress. She has no wheezes.  Abdominal: Soft. She exhibits no distension. There is no tenderness.  No drainage from umbilicus, however healing opening noted where fluid was leaking from. Umbilical hernia noted, reducible.  Skin:  No increased warmth or redness of umbilicus noted  Psychiatric: She has a normal mood and affect. Her behavior is normal.      Assessment and Plan:     1. Umbilical abnormality Concerning for Urachal Cyst (would expect earlier presentation) vs. Urinary Bladder Diverticulum vs. Alternative diagnosis. Complete course of antibiotics as prescribed. Will obtain US to determine origin of draining fluid. Determine if General Surgery vs. Urology referral is warranted based on [place] results. Return if symptoms worsen.

## 2016-10-12 ENCOUNTER — Telehealth: Payer: Self-pay | Admitting: Internal Medicine

## 2016-10-12 NOTE — Telephone Encounter (Signed)
Pt has has a slight fever at times, unproductive cough, and congestion. Pt would like to know what she can take for this. Please advise. ep

## 2016-10-12 NOTE — Telephone Encounter (Signed)
Return call to patient regarding cold symptoms.  Advised patient that she could try mucinex or robitussin to help with her symptoms. May sure she kept well hydrated as well.  If symptoms worsen to call for an appointment. Will forward to PCP for further advise. Derl Barrow, RN

## 2016-10-13 ENCOUNTER — Ambulatory Visit (INDEPENDENT_AMBULATORY_CARE_PROVIDER_SITE_OTHER): Payer: Medicare Other | Admitting: Psychology

## 2016-10-13 ENCOUNTER — Other Ambulatory Visit: Payer: Self-pay | Admitting: Family Medicine

## 2016-10-13 ENCOUNTER — Ambulatory Visit: Payer: Self-pay

## 2016-10-13 ENCOUNTER — Ambulatory Visit (HOSPITAL_COMMUNITY)
Admission: RE | Admit: 2016-10-13 | Discharge: 2016-10-13 | Disposition: A | Discharge status: Home / Self Care | Payer: Medicare Other | Source: Ambulatory Visit | Attending: Family Medicine | Admitting: Family Medicine

## 2016-10-13 DIAGNOSIS — R609 Edema, unspecified: Secondary | ICD-10-CM | POA: Diagnosis not present

## 2016-10-13 DIAGNOSIS — L02216 Cutaneous abscess of umbilicus: Secondary | ICD-10-CM | POA: Diagnosis not present

## 2016-10-13 DIAGNOSIS — F331 Major depressive disorder, recurrent, moderate: Secondary | ICD-10-CM

## 2016-10-13 DIAGNOSIS — Q899 Congenital malformation, unspecified: Secondary | ICD-10-CM

## 2016-10-13 DIAGNOSIS — K7460 Unspecified cirrhosis of liver: Secondary | ICD-10-CM | POA: Diagnosis not present

## 2016-10-13 NOTE — Assessment & Plan Note (Signed)
Assessment / Plan / Recommendations: Patient expressed herself clearly and coherently in our appointment today. She was able to give detailed information (i.e. Told me about a book title/author that she ordered and plans to read). There was one occasion when she lost her train of thought and forgot what her point was, but this did not characterize our interaction. She expressed a full range of appropriate affect.   Patient reports that she does not currently do anything that she enjoys, and that she often feels cooped up in her room to avoid having to be around her housemate, who has been getting on her nerves. We discussed trying to find ways for her to start doing more things she enjoys. Patient liked this idea and explained that she used to set small goals for herself and would feel really good when she achieved them. Today we made a small goal for patient to ask her friend if she could get a ride to church, as she has been wanting to get involved in a church. Patient feels she can accomplish this goal. She would like to follow up with me in 3 weeks.

## 2016-10-13 NOTE — Progress Notes (Signed)
Patient scheduled a [facility] consult per Dr. Latina Craver recommendation at her 10/02/2016 appointment  Presenting Issue:  Concentration issues, depression, anxiety  Report of symptoms:  Patient initially expressed concern to her doctor due to her friend commenting to her that her thoughts seemed disorganized on the phone, and that she was jumping from one subject to another, which struck the friend as unusual. Patient has not noticed any changes in cognition and states that poor concentration could be due to history of head injury. Patient stated that she is currently at an "all time low" with her mood and medical issues. She explained that she is struggling with her house payment as her disability check does not cover everything. Patient also feels that she doesn't have many friends anymore and the only time she gets out of the house is to go to doctor's appointments. She talks to one friend 1-2 times per week on the phone, but otherwise only has contact with her house-mate, who she has been living with for about 13 years and "doesn't see eye to eye with." She stated that their relationship has always been strictly a friendship, and that he doesn't have a job so they are cooped up in the house together every day.   Duration of CURRENT symptoms:  Patient states she has been struggling with depression and anxiety on and off for many years.   Psychiatric History - Diagnoses: Depression, anxiety per patient - Hospitalizations: none for psychiatric reasons, per patient - Pharmacotherapy: managed by Dr. De Nurse, [place] therapy: none currently  PHQ-9:  19 (item 9="0")

## 2016-10-13 NOTE — Patient Instructions (Addendum)
It was nice to meet you today!   Today we discussed setting a small goal for the next week. Try to ask your friend directly if she could take you to church every week. If she says no, try asking if anyone else at church could give you a ride.   Schedule a followup appointment with me Wendy [name], in behavioral health) for Tuesday March 13th. See you then!

## 2016-10-14 ENCOUNTER — Telehealth: Payer: Self-pay | Admitting: Internal Medicine

## 2016-10-14 NOTE — Telephone Encounter (Signed)
Pt would like someone to call with u/s results. ep

## 2016-10-16 ENCOUNTER — Telehealth: Payer: Self-pay | Admitting: Internal Medicine

## 2016-10-16 NOTE — Telephone Encounter (Signed)
Patient calling provider back regarding ultrasound results.  Will forward to Dr. Gerlean Ren who ordered this. Dayle Sherpa,CMA

## 2016-10-16 NOTE — Telephone Encounter (Signed)
Pt called and would like the results of her [place] she had done on the 20 th. jw

## 2016-10-16 NOTE — Telephone Encounter (Signed)
[facility]! I see that you ordered this ultrasound for Wendy [name]. Could you call her back with the results when you get a chance? Thanks! Abby

## 2016-10-16 NOTE — Telephone Encounter (Signed)
Returned patient call x2 without response. Voice message left at request of patient. US showed small collection of fluid at umbilicus, but no tract to bladder which was our concern. Radiology recommends workup with CT if no improvement of symptoms. Would continue to monitor if she is continuing to improve. Encouraged to call office to let us know the status of her symptoms.

## 2016-10-27 ENCOUNTER — Telehealth: Payer: Self-pay | Admitting: Internal Medicine

## 2016-10-27 ENCOUNTER — Other Ambulatory Visit: Payer: Self-pay | Admitting: Internal Medicine

## 2016-10-27 MED ORDER — DEXTROMETHORPHAN-GUAIFENESIN 10-100 MG/5ML PO SYRP: 5.0000 mL | ORAL_SOLUTION | Freq: Two times a day (BID) | ORAL | 0 refills | Status: DC

## 2016-10-27 MED ORDER — ACETAMINOPHEN ER 650 MG PO TBCR: 650.0000 mg | EXTENDED_RELEASE_TABLET | Freq: Three times a day (TID) | ORAL | 0 refills | Status: AC | PRN

## 2016-10-27 NOTE — Progress Notes (Signed)
Received phone call that patient is having a mild sore throat and cough at home. She cannot afford to come in to clinic and she cannot afford any over-the-counter medications.  Will prescribe Tylenol and Robitussin for her symptoms.  Hyman Bible, MD

## 2016-10-27 NOTE — Telephone Encounter (Signed)
Please let patient know that I have prescribed some Robitussin for her cough and Tylenol for her sore throat.

## 2016-10-27 NOTE — Telephone Encounter (Signed)
The prescriptions called in today cost $27. She cannot afford that. She would like to have something cheaper called in for her throat/cough.  [facility] [address]

## 2016-10-27 NOTE — Telephone Encounter (Signed)
Pt has a mild sore throat, cough and no fever. Pt can't come in and can't afford anything over the counter. Pt [facility] [address]. ep

## 2016-10-27 NOTE — Telephone Encounter (Signed)
Will forward to PCP.  Pessy Delamar L, RN  

## 2016-10-28 ENCOUNTER — Other Ambulatory Visit: Payer: Self-pay | Admitting: Internal Medicine

## 2016-10-28 MED ORDER — BENZONATATE 100 MG PO CAPS: 100.0000 mg | ORAL_CAPSULE | Freq: Two times a day (BID) | ORAL | 0 refills | Status: DC | PRN

## 2016-10-28 NOTE — Telephone Encounter (Signed)
I have prescribed some Tessalon for her cough to see if her insurance will cover this. Would also recommend that she buy some Tylenol over-the-counter. Usually insurance does not cover over-the-counter medications. Thanks!

## 2016-11-03 ENCOUNTER — Ambulatory Visit: Payer: Self-pay

## 2016-11-10 ENCOUNTER — Ambulatory Visit: Payer: Self-pay | Admitting: Internal Medicine

## 2016-11-10 ENCOUNTER — Telehealth: Payer: Self-pay | Admitting: Internal Medicine

## 2016-11-10 NOTE — Telephone Encounter (Signed)
No charge. 

## 2016-11-16 DIAGNOSIS — K7469 Other cirrhosis of liver: Secondary | ICD-10-CM | POA: Diagnosis not present

## 2016-11-19 ENCOUNTER — Telehealth: Payer: Self-pay | Admitting: Internal Medicine

## 2016-11-19 NOTE — Telephone Encounter (Signed)
Pt is scheduled for OV with Dr. Carlean Purl 12/22/16. Left message for pt that she should keep her OV as scheduled and Dr. Carlean Purl can decide if she needs an [place] at that visit. [place] would then be scheduled after her visit. Encouraged pt to call back with any questions.

## 2016-11-19 NOTE — Telephone Encounter (Signed)
lmovm for callback. Fleeger, Jessica Dawn, CMA  

## 2016-11-19 NOTE — Progress Notes (Signed)
Patient called our office back regarding Chantix request.  Informed that she will need an office visit with Dr. Avon Gully to discuss.  Patient verbalized understanding and stated she already has an appt with her and will discuss at that time.  Burna Forts, BSN, RN-BC'

## 2016-11-19 NOTE — Telephone Encounter (Signed)
Spoke with the patient. Her "[facility] doctors"  Saw her recently in an office visit for routine follow up. He spoke to her about having regular u/s every 6 months to monitor her cirrhosis. He told her there would not be any sx's of liver cancer, but she would know when she had fluid. The patient's last u/s was in December 2018. She was scared by what was said and wanted to be certain she got regular u/s. Her follow up appointment is in May, rescheduled from 11/10/16.

## 2016-11-19 NOTE — Telephone Encounter (Signed)
Pt would like to start chantix, pt believes she has mentioned it to PCP before. [facility] [address] ep

## 2016-11-19 NOTE — Telephone Encounter (Signed)
Please let patient know she needs to schedule an appointment to discuss this. Thanks! - AJL

## 2016-11-22 ENCOUNTER — Other Ambulatory Visit: Payer: Self-pay | Admitting: Internal Medicine

## 2016-11-22 DIAGNOSIS — G8929 Other chronic pain: Secondary | ICD-10-CM

## 2016-11-22 DIAGNOSIS — M549 Dorsalgia, unspecified: Secondary | ICD-10-CM

## 2016-11-22 DIAGNOSIS — L299 Pruritus, unspecified: Secondary | ICD-10-CM

## 2016-11-24 MED ORDER — TRAMADOL HCL 50 MG PO TABS: 50.0000 mg | ORAL_TABLET | Freq: Two times a day (BID) | ORAL | 2 refills | Status: DC

## 2016-11-24 NOTE — Telephone Encounter (Signed)
Patient informed rx was ready to be picked up.

## 2016-11-28 ENCOUNTER — Other Ambulatory Visit: Payer: Self-pay | Admitting: Internal Medicine

## 2016-11-30 ENCOUNTER — Other Ambulatory Visit (HOSPITAL_COMMUNITY): Payer: Self-pay | Admitting: Internal Medicine

## 2016-12-01 DIAGNOSIS — M5136 Other intervertebral disc degeneration, lumbar region: Secondary | ICD-10-CM | POA: Diagnosis not present

## 2016-12-03 ENCOUNTER — Ambulatory Visit: Payer: Self-pay | Admitting: Internal Medicine

## 2016-12-03 ENCOUNTER — Telehealth: Payer: Self-pay | Admitting: Internal Medicine

## 2016-12-03 NOTE — Telephone Encounter (Signed)
I am not away of any eye conditions that patient has and have never evaluated her for any eye problems. She needs to schedule an appointment for evaluation at our office before I will place any referrals. Thanks! - AJL

## 2016-12-03 NOTE — Telephone Encounter (Signed)
Pt called and would like a referral to a opthamologist for her eye condition. jw

## 2016-12-03 NOTE — Telephone Encounter (Signed)
Left Message on Voicemail to Call back.

## 2016-12-04 NOTE — Telephone Encounter (Signed)
Pt was advised. ep °

## 2016-12-05 ENCOUNTER — Other Ambulatory Visit: Payer: Self-pay | Admitting: Internal Medicine

## 2016-12-05 DIAGNOSIS — I10 Essential (primary) hypertension: Secondary | ICD-10-CM

## 2016-12-07 ENCOUNTER — Other Ambulatory Visit: Payer: Self-pay | Admitting: Internal Medicine

## 2016-12-07 ENCOUNTER — Telehealth: Payer: Self-pay | Admitting: Internal Medicine

## 2016-12-07 NOTE — Telephone Encounter (Signed)
Do you want lab on her prior to the office visit she has with you on 5/1?

## 2016-12-08 NOTE — Telephone Encounter (Signed)
Do you want her to have labs prior to [place] on 5/1?

## 2016-12-08 NOTE — Telephone Encounter (Signed)
No had late March labs at [facility] that I can see

## 2016-12-08 NOTE — Telephone Encounter (Signed)
I left a detailed message that no labs are needed at this time.

## 2016-12-08 NOTE — Telephone Encounter (Signed)
Patient calling in regarding this.  °

## 2016-12-09 ENCOUNTER — Ambulatory Visit: Payer: Medicare Other | Admitting: Internal Medicine

## 2016-12-09 ENCOUNTER — Other Ambulatory Visit: Payer: Self-pay | Admitting: Internal Medicine

## 2016-12-11 ENCOUNTER — Other Ambulatory Visit: Payer: Self-pay | Admitting: Internal Medicine

## 2016-12-13 ENCOUNTER — Other Ambulatory Visit: Payer: Self-pay | Admitting: Internal Medicine

## 2016-12-14 ENCOUNTER — Other Ambulatory Visit: Payer: Self-pay | Admitting: Internal Medicine

## 2016-12-14 MED ORDER — FUROSEMIDE 40 MG PO TABS: 60.0000 mg | ORAL_TABLET | Freq: Every day | ORAL | 1 refills | Status: DC

## 2016-12-14 NOTE — Telephone Encounter (Signed)
In September we made these changes - noted in lab results   Let's try furosemide 1.5 tabs daily (60 mg) Spironolactone 1.5 tabs daily (150 mg)

## 2016-12-14 NOTE — Telephone Encounter (Signed)
Dr. Carlean Purl, is she supposed to be on 100mg  faily or 150 mg of spironolactone?

## 2016-12-16 ENCOUNTER — Other Ambulatory Visit: Payer: Self-pay | Admitting: Internal Medicine

## 2016-12-16 ENCOUNTER — Ambulatory Visit: Payer: Medicare Other | Admitting: Family Medicine

## 2016-12-16 MED ORDER — GABAPENTIN 300 MG PO CAPS: 300.0000 mg | ORAL_CAPSULE | Freq: Three times a day (TID) | ORAL | 3 refills | Status: DC

## 2016-12-16 NOTE — Telephone Encounter (Signed)
Pt  calling to request refill of:  Name of Medication(s):  gabapentin  Last date of OV:  10-08-16 [facility]   Will route refill request to Clinic RN.  Discussed with patient policy to call pharmacy for future refills.  Also, discussed refills may take up to 48 hours to approve or deny.  Renella Cunas

## 2016-12-17 ENCOUNTER — Encounter: Payer: Self-pay | Admitting: Internal Medicine

## 2016-12-17 ENCOUNTER — Ambulatory Visit (INDEPENDENT_AMBULATORY_CARE_PROVIDER_SITE_OTHER): Payer: Medicare Other | Admitting: Internal Medicine

## 2016-12-17 VITALS — BP 106/58 | HR 68 | Temp 98.2°F | Ht 62.0 in | Wt 210.2 lb

## 2016-12-17 DIAGNOSIS — H5789 Other specified disorders of eye and adnexa: Secondary | ICD-10-CM

## 2016-12-17 DIAGNOSIS — H578 Other specified disorders of eye and adnexa: Secondary | ICD-10-CM | POA: Diagnosis not present

## 2016-12-17 MED ORDER — OLOPATADINE HCL 0.2 % OP SOLN: 1.0000 [drp] | Freq: Two times a day (BID) | OPHTHALMIC | 0 refills | Status: DC

## 2016-12-17 NOTE — Patient Instructions (Addendum)
Please try Pataday to help with your eyes. I will send in referral to ophthalmology. Please follow-up with your PCP in the next couple weeks to discuss smoking cessation

## 2016-12-17 NOTE — Progress Notes (Signed)
   [name] Wendy Mo, MD Phone: [phone]  Reason For Visit: SDA for Several Months of Eye Discharge   #Presents with having eye discharge noted at night and in the morning as her primary concern. Indicates discharge is slightly yellow. She has had it for several months. She indicates that she does not have irritation, erythema, itchiness associated with this discharge. It is not associated with congestion or cough. She has recently seen her optometrist whom told her she likely has dry eye   Past Medical History Reviewed problem list.  Medications- reviewed and updated No additions to family history Social history- patient is a non smoker  Objective: BP (!) 106/58   Pulse 68   Temp 98.2 F (36.8 C) (Oral)   Ht 5\' 2"  (1.575 m)   Wt 210 lb 3.2 oz (95.3 kg)   SpO2 94%   BMI 38.45 kg/m  Gen: NAD, alert, cooperative with exam HEENT: Normal    Eyes: PERRLA, EOMI, conjunctiva within normal limits, no eye discharge     Nose: nasal turbinates moist    Throat: moist mucus membranes, no erythema Skin: dry, intact, no rashes or lesions   Assessment/Plan: See problem based a/p  Eye drainage Symptoms of eye drainage per patient, though completely normal exam. Per optometry noted to have dry eye. However would like a referral to opthalmology - vision 20/40 however patient indicates optometry states patient needs her lens changed  - provided pataday drops  - referral to opthalmology

## 2016-12-21 ENCOUNTER — Ambulatory Visit: Payer: Self-pay | Admitting: Internal Medicine

## 2016-12-21 NOTE — Assessment & Plan Note (Signed)
Symptoms of eye drainage per patient, though completely normal exam. Per optometry noted to have dry eye. However would like a referral to opthalmology - vision 20/40 however patient indicates optometry states patient needs her lens changed  - provided pataday drops  - referral to opthalmology

## 2016-12-22 ENCOUNTER — Ambulatory Visit: Payer: Self-pay | Admitting: Internal Medicine

## 2016-12-22 ENCOUNTER — Telehealth: Payer: Self-pay | Admitting: Internal Medicine

## 2016-12-22 NOTE — Telephone Encounter (Signed)
Left detailed message for Wendy [name] to call me back about changing her appointment time.

## 2016-12-22 NOTE — Telephone Encounter (Signed)
Wendy [name] says she doesn't feel well enough to get herself to the 1:30pm appointment today w/Dr Carlean Purl. I have rescheduled her w/App for next week Wed 5-9 at 2pm because she says she could not wait until next available in June.

## 2016-12-22 NOTE — Telephone Encounter (Signed)
OK Understand - she has been ill so no charge  I think it best she see me and not an APP Let's see if we can offer her a spot the next day 5/10

## 2016-12-22 NOTE — Telephone Encounter (Signed)
Mescal called back and we will change her appointment to 12/31/16 at 11:15AM to see Dr Carlean Purl.

## 2016-12-23 ENCOUNTER — Ambulatory Visit: Payer: Medicare Other | Admitting: *Deleted

## 2016-12-24 ENCOUNTER — Ambulatory Visit: Payer: Medicare Other | Admitting: Internal Medicine

## 2016-12-29 DIAGNOSIS — H3530 Unspecified macular degeneration: Secondary | ICD-10-CM | POA: Diagnosis not present

## 2016-12-30 ENCOUNTER — Ambulatory Visit: Payer: Self-pay | Admitting: Gastroenterology

## 2016-12-31 ENCOUNTER — Telehealth: Payer: Self-pay | Admitting: Internal Medicine

## 2016-12-31 ENCOUNTER — Ambulatory Visit: Payer: Self-pay | Admitting: Internal Medicine

## 2016-12-31 NOTE — Telephone Encounter (Signed)
Patient called to cancel appt for today 5.10.18 due to thinking her appt was at 1:30pm not 11:15am and can not make it today. fyi this is the fourth time this pt has canceled same day appt. Patient notified that we would speak with Dr.Gessner before rescheduling.

## 2017-01-05 ENCOUNTER — Other Ambulatory Visit: Payer: Self-pay | Admitting: *Deleted

## 2017-01-05 ENCOUNTER — Ambulatory Visit: Payer: Medicare Other | Admitting: *Deleted

## 2017-01-05 ENCOUNTER — Telehealth: Payer: Self-pay | Admitting: Internal Medicine

## 2017-01-05 DIAGNOSIS — F4001 Agoraphobia with panic disorder: Secondary | ICD-10-CM

## 2017-01-05 DIAGNOSIS — H04123 Dry eye syndrome of bilateral lacrimal glands: Secondary | ICD-10-CM | POA: Diagnosis not present

## 2017-01-05 MED ORDER — CLONAZEPAM 1 MG PO TABS: 0.5000 mg | ORAL_TABLET | Freq: Two times a day (BID) | ORAL | 2 refills | Status: DC | PRN

## 2017-01-05 NOTE — Telephone Encounter (Signed)
Patient's Klonopin refilled. Confirmed that we prescribe this for patient rather than her psychiatrist.

## 2017-01-06 ENCOUNTER — Other Ambulatory Visit: Payer: Self-pay | Admitting: Internal Medicine

## 2017-01-06 DIAGNOSIS — L299 Pruritus, unspecified: Secondary | ICD-10-CM

## 2017-01-06 NOTE — Telephone Encounter (Signed)
Please advise Sir? 

## 2017-01-06 NOTE — Telephone Encounter (Signed)
Pt informed. Deseree Blount, CMA  

## 2017-01-08 ENCOUNTER — Telehealth: Payer: Self-pay | Admitting: Internal Medicine

## 2017-01-08 MED ORDER — DOCUSATE SODIUM 100 MG PO CAPS: 100.0000 mg | ORAL_CAPSULE | Freq: Two times a day (BID) | ORAL | 2 refills | Status: DC

## 2017-01-08 NOTE — Telephone Encounter (Signed)
May we refill Sir, thank you for your time.

## 2017-01-08 NOTE — Telephone Encounter (Signed)
Spoke with Wendy [name] and she has made an appointment for early June.  I sent in a refill for her colace.  And I informed her the last liver U/S was 07/2016. I stressed to her that it is very important that we see her.

## 2017-01-08 NOTE — Telephone Encounter (Signed)
Patient has made a follow up appointment early June Sir.

## 2017-01-11 ENCOUNTER — Other Ambulatory Visit (HOSPITAL_COMMUNITY): Payer: Self-pay | Admitting: Gastroenterology

## 2017-01-11 ENCOUNTER — Telehealth: Payer: Self-pay | Admitting: Internal Medicine

## 2017-01-11 ENCOUNTER — Other Ambulatory Visit: Payer: Self-pay | Admitting: Gastroenterology

## 2017-01-11 DIAGNOSIS — Z1289 Encounter for screening for malignant neoplasm of other sites: Secondary | ICD-10-CM

## 2017-01-11 DIAGNOSIS — K7460 Unspecified cirrhosis of liver: Secondary | ICD-10-CM

## 2017-01-11 NOTE — Telephone Encounter (Signed)
Please advise Sir, thank you. 

## 2017-01-11 NOTE — Telephone Encounter (Signed)
Left message for Verbena that rx refilled.

## 2017-01-11 NOTE — Telephone Encounter (Signed)
Furosemide 40 mg bid # 60 [id number] RF

## 2017-01-14 ENCOUNTER — Telehealth: Payer: Self-pay | Admitting: Internal Medicine

## 2017-01-14 NOTE — Telephone Encounter (Signed)
Please let patient know we do not call in pain medication for patients who have not been evaluated. She needs to attend her appointment tomorrow to be evaluated for this.   Adin Hector, MD, MPH PGY-2 [facility] Pager [phone]

## 2017-01-14 NOTE — Telephone Encounter (Signed)
Pt has teeth pain and can't get in to see the dentist till next week and wants something for pain. Pt made an appointment for tomorrow, but she doesn't want to come in. Pt wants to just have something called. ep

## 2017-01-15 ENCOUNTER — Ambulatory Visit (HOSPITAL_COMMUNITY): Payer: Medicare Other

## 2017-01-15 ENCOUNTER — Telehealth: Payer: Self-pay | Admitting: Internal Medicine

## 2017-01-15 ENCOUNTER — Ambulatory Visit: Payer: Medicare Other | Admitting: Family Medicine

## 2017-01-15 NOTE — Telephone Encounter (Signed)
If patient calls back, she can take her Tramadol as prescribed. She should not take extra. She should go to the dental appointment that she has scheduled, or actually attend an appointment scheduled with [place]. We will not prescribe pain medication for her over the phone. She can try OraGel if the pain is not controlled with her prescribed dose of Tramadol.   Adin Hector, MD, MPH PGY-2 [facility] Pager [phone]

## 2017-01-15 NOTE — Telephone Encounter (Signed)
Patient was scheduled to see [facility] today at 72 but decided to canceled appt because she realized we were not a dentist. She is having tooth pain and would like to know if she can take 2 tabs of Tramadol to help with the pain. Please advise.

## 2017-01-19 ENCOUNTER — Ambulatory Visit (HOSPITAL_COMMUNITY): Payer: Self-pay | Admitting: Psychiatry

## 2017-01-20 ENCOUNTER — Encounter: Payer: Self-pay | Admitting: Internal Medicine

## 2017-01-20 ENCOUNTER — Ambulatory Visit (INDEPENDENT_AMBULATORY_CARE_PROVIDER_SITE_OTHER): Payer: Medicare Other | Admitting: Internal Medicine

## 2017-01-20 DIAGNOSIS — Z720 Tobacco use: Secondary | ICD-10-CM | POA: Diagnosis not present

## 2017-01-20 DIAGNOSIS — R251 Tremor, unspecified: Secondary | ICD-10-CM

## 2017-01-20 MED ORDER — PROPRANOLOL HCL 60 MG PO TABS: 60.0000 mg | ORAL_TABLET | Freq: Two times a day (BID) | ORAL | 1 refills | Status: DC

## 2017-01-20 MED ORDER — VARENICLINE TARTRATE 0.5 MG PO TABS: 0.5000 mg | ORAL_TABLET | Freq: Two times a day (BID) | ORAL | 0 refills | Status: DC

## 2017-01-20 NOTE — Patient Instructions (Signed)
It was nice seeing you again today Ms. Presas!  Please STOP taking carvedilol (Coreg). Instead, take propranolol 60 mg (one tablet) twice a day. This will help both your blood pressure and the tremors.   Begin by taking one 0.5mg  tablet of Chantix a day for the next 11 days. After that, increase to one tablet twice a day. Begin taking Chantix tomorrow, then your quit date is Monday, June 4th.   I will see you back in one month to see how things are going both with your smoking and the tremors.   If you have any questions or concerns, please feel free to call the clinic.   Be well,  Dr. Avon Gully

## 2017-01-20 NOTE — Assessment & Plan Note (Signed)
No tremors noted on exam today, either intentional or resting, however patient reporting significant interference with her life. As such, will begin trial of propranolol to see if any improvement. Patient already taking carvedilol, so d'ced carvedilol and will begin propranolol in its place.  - F/u in one month

## 2017-01-20 NOTE — Assessment & Plan Note (Signed)
Patient wishing to quit. Currently smoking 6-7 cigarettes per day. Will begin Chantix. Also discussed coping methods for possible temptations/triggers.  - Begin Chantix 0.5mg  x11 days then 0.5mg  BID per UpToDate recommendations - Set quit date for Monday, June 04 - F/u in one month

## 2017-01-20 NOTE — Progress Notes (Signed)
   Subjective:   Patient: Wendy [name]       Birthdate: 08/31/1960       MRN: [id number]      HPI  Wendy [name] is a 56 y.o. female presenting for tobacco cessation and shaking.   Tobacco abuse Currently smoking 6-7 cigarettes per day. Was smoking only 3 cigarettes per day in January, however increased number due to increased stress. Has been able to quit in the past with help of Chantix, she thinks about three years ago. Is hoping to use Chantix again so she can "quit for good" this time. Is confident in her ability to quit. Has met with Clent Jacks and learned some coping mechanisms that she says she will use when she is tempted to smoke. Wants to set quit date for Monday, June 4th.   Shaking Is not a new problem but has worsened recently. Patient cannot say whether it happens more when she is in motion or at rest, but does note that it is particularly bad when she is writing. Tremor present both in arms and legs. Nothing seems to improve symptoms. Symptoms are worse at night. She does not have a family history of tremors.   Smoking status reviewed. Patient is current every day smoker.   Review of Systems See HPI.     Objective:  Physical Exam  Constitutional: She is well-developed, well-nourished, and in no distress.  HENT:  Head: Normocephalic and atraumatic.  Cardiovascular: Normal rate, regular rhythm and normal heart sounds.   Pulmonary/Chest: Effort normal and breath sounds normal. No respiratory distress. She has no wheezes.  Neurological:  5/5 strength upper extremities bilaterally. No issues with finger to nose or rapid alternating hand movement. No tremor noted, either intentional or resting in upper or lower extremities.   Skin: Skin is warm and dry.  Psychiatric: Affect and judgment normal.      Assessment & Plan:  Tobacco abuse Patient wishing to quit. Currently smoking 6-7 cigarettes per day. Will begin Chantix. Also discussed coping methods for possible  temptations/triggers.  - Begin Chantix 0.'5mg'$  x11 days then 0.'5mg'$  BID per UpToDate recommendations - Set quit date for Monday, June 04 - F/u in one month  Shaking No tremors noted on exam today, either intentional or resting, however patient reporting significant interference with her life. As such, will begin trial of propranolol to see if any improvement. Patient already taking carvedilol, so d'ced carvedilol and will begin propranolol in its place.  - F/u in one month   Adin Hector, MD, MPH PGY-2 [facility] Pager [phone]

## 2017-01-21 ENCOUNTER — Ambulatory Visit (HOSPITAL_COMMUNITY): Payer: Self-pay | Admitting: Psychiatry

## 2017-01-21 ENCOUNTER — Telehealth: Payer: Self-pay | Admitting: Internal Medicine

## 2017-01-21 NOTE — Telephone Encounter (Signed)
Pt is going to have a root canal and due to pt's allergies the dentist wants to know what he can give pt for pain. ep

## 2017-01-22 ENCOUNTER — Ambulatory Visit (INDEPENDENT_AMBULATORY_CARE_PROVIDER_SITE_OTHER): Payer: Medicare Other | Admitting: Psychiatry

## 2017-01-22 ENCOUNTER — Encounter (HOSPITAL_COMMUNITY): Payer: Self-pay | Admitting: Psychiatry

## 2017-01-22 ENCOUNTER — Ambulatory Visit (HOSPITAL_COMMUNITY)
Admission: RE | Admit: 2017-01-22 | Discharge: 2017-01-22 | Disposition: A | Discharge status: Home / Self Care | Payer: Medicare Other | Source: Ambulatory Visit | Attending: Gastroenterology | Admitting: Gastroenterology

## 2017-01-22 VITALS — BP 130/80 | HR 78 | Resp 16 | Ht 62.0 in | Wt 217.0 lb

## 2017-01-22 DIAGNOSIS — F4001 Agoraphobia with panic disorder: Secondary | ICD-10-CM

## 2017-01-22 DIAGNOSIS — F0630 Mood disorder due to known physiological condition, unspecified: Secondary | ICD-10-CM | POA: Diagnosis not present

## 2017-01-22 DIAGNOSIS — F411 Generalized anxiety disorder: Secondary | ICD-10-CM

## 2017-01-22 DIAGNOSIS — F331 Major depressive disorder, recurrent, moderate: Secondary | ICD-10-CM | POA: Diagnosis not present

## 2017-01-22 DIAGNOSIS — K8020 Calculus of gallbladder without cholecystitis without obstruction: Secondary | ICD-10-CM | POA: Diagnosis not present

## 2017-01-22 DIAGNOSIS — R188 Other ascites: Secondary | ICD-10-CM | POA: Diagnosis not present

## 2017-01-22 DIAGNOSIS — K7460 Unspecified cirrhosis of liver: Secondary | ICD-10-CM | POA: Diagnosis not present

## 2017-01-22 DIAGNOSIS — Z1289 Encounter for screening for malignant neoplasm of other sites: Secondary | ICD-10-CM

## 2017-01-22 DIAGNOSIS — J90 Pleural effusion, not elsewhere classified: Secondary | ICD-10-CM | POA: Insufficient documentation

## 2017-01-22 DIAGNOSIS — Z9081 Acquired absence of spleen: Secondary | ICD-10-CM | POA: Insufficient documentation

## 2017-01-22 MED ORDER — ESCITALOPRAM OXALATE 20 MG PO TABS: 20.0000 mg | ORAL_TABLET | Freq: Every day | ORAL | 3 refills | Status: DC

## 2017-01-22 MED ORDER — ESCITALOPRAM OXALATE 10 MG PO TABS: 15.0000 mg | ORAL_TABLET | Freq: Every day | ORAL | 3 refills | Status: DC

## 2017-01-22 MED ORDER — QUETIAPINE FUMARATE 25 MG PO TABS: 25.0000 mg | ORAL_TABLET | Freq: Every day | ORAL | 3 refills | Status: DC

## 2017-01-22 NOTE — Telephone Encounter (Signed)
Called number provided x 2, no answer no voicemail.
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
## 2017-01-23 ENCOUNTER — Other Ambulatory Visit: Payer: Self-pay | Admitting: Internal Medicine

## 2017-01-25 ENCOUNTER — Encounter: Payer: Self-pay | Admitting: Internal Medicine

## 2017-01-25 ENCOUNTER — Ambulatory Visit (INDEPENDENT_AMBULATORY_CARE_PROVIDER_SITE_OTHER): Payer: Medicare Other | Admitting: Internal Medicine

## 2017-01-25 VITALS — BP 100/70 | HR 64 | Ht 62.0 in | Wt 216.0 lb

## 2017-01-25 DIAGNOSIS — K7460 Unspecified cirrhosis of liver: Secondary | ICD-10-CM

## 2017-01-25 DIAGNOSIS — I8510 Secondary esophageal varices without bleeding: Secondary | ICD-10-CM | POA: Diagnosis not present

## 2017-01-25 DIAGNOSIS — R1319 Other dysphagia: Secondary | ICD-10-CM

## 2017-01-25 DIAGNOSIS — K224 Dyskinesia of esophagus: Secondary | ICD-10-CM | POA: Diagnosis not present

## 2017-01-25 DIAGNOSIS — R1310 Dysphagia, unspecified: Secondary | ICD-10-CM | POA: Diagnosis not present

## 2017-01-25 DIAGNOSIS — R188 Other ascites: Secondary | ICD-10-CM

## 2017-01-25 DIAGNOSIS — K449 Diaphragmatic hernia without obstruction or gangrene: Secondary | ICD-10-CM

## 2017-01-25 NOTE — Patient Instructions (Signed)
   Hang in there. I will see what Dr. Monica Martinez thinks about possibility of fixing the hernia and let you know  I appreciate the opportunity to care for you. Gatha Mayer, MD, [facility]

## 2017-01-25 NOTE — Progress Notes (Signed)
Wendy [name] 56 y.o. [birth year] [id number]  Assessment & Plan:   Encounter Diagnoses  Name Primary?  . Cirrhosis of liver with ascites, unspecified hepatic cirrhosis type ([place])   . Secondary esophageal varices without bleeding (HCC) Yes  . Paraesophageal hiatal hernia   . Esophageal dysmotility   . Esophageal dysphagia     Overall seems  Stable with cirrhosis, parasesophageal hiatal hernia and dysphagia issues. I doubt she is appropriate to attempt laparoscopic hiatal hernoa repair but will check with hepatology and possibly surgery Otherwise RTC late 2018 - I will try to see her every 6 mos in between [facility] visits  BL:[id number], Trudie Reed, MD Dr. Claybon Jabs - [facility]  Subjective:   Chief Complaint:, nausea - weight up and down  HPI Here for f/u - saw [facility] hepatology in late March low MELD and not transplant candidate. HCV is cured. F/U Fall 2018. Other sxs: Fatigue Urinary incontinence - urge and stress Dysphagia Wt Readings from Last 3 Encounters:  01/25/17 216 lb (98 kg)  01/20/17 219 lb (99.3 kg)  12/17/16 210 lb 3.2 oz (95.3 kg)   Allergies  Allergen Reactions  . Codeine Phosphate Itching  . Codeine Rash   Current Meds  Medication Sig  . acetaminophen (TYLENOL 8 HOUR) 650 MG CR tablet Take 1 tablet (650 mg total) by mouth every 8 (eight) hours as needed for pain.  Marland Kitchen aluminum hydroxide-magnesium carbonate (GAVISCON) 95-358 MG/15ML SUSP Take 30 mLs by mouth at bedtime.  . benzonatate (TESSALON) 100 MG capsule Take 1 capsule (100 mg total) by mouth 2 (two) times daily as needed for cough.  . Biotin 1000 MCG tablet Take 1,000 mcg by mouth 3 (three) times daily.  . bisacodyl ([employer] GENTLE LAXATIVE) 5 MG EC tablet TAKE 1 TABLET (5 MG TOTAL) BY MOUTH DAILY. (Patient taking differently: daily as needed. TAKE 1 TABLET (5 MG TOTAL) BY MOUTH DAILY.)  . Cholecalciferol (VITAMIN D3) 2000 UNITS capsule Take 2,000 Units by mouth daily.  . clonazePAM (KLONOPIN) 1 MG  tablet Take 0.5-1 tablets (0.5-1 mg total) by mouth 2 (two) times daily as needed for anxiety.  . cycloSPORINE (RESTASIS) 0.05 % ophthalmic emulsion Place 1 drop into both eyes 2 (two) times daily.  Marland Kitchen docusate sodium (COLACE) 100 MG capsule Take 1 capsule (100 mg total) by mouth 2 (two) times daily.  Marland Kitchen escitalopram (LEXAPRO) 20 MG tablet Take 1 tablet (20 mg total) by mouth daily. Stop the 10mg  tablets. Dose increased to 20mg  today  . fluticasone (FLONASE) 50 MCG/ACT nasal spray Place 2 sprays into both nostrils daily.  . furosemide (LASIX) 40 MG tablet Take 1.5 tablets (60 mg total) by mouth daily.  Marland Kitchen gabapentin (NEURONTIN) 300 MG capsule Take 1 capsule (300 mg total) by mouth 3 (three) times daily.  . hydrOXYzine (ATARAX/VISTARIL) 25 MG tablet TAKE 1 TO 2 TABLETS BY MOUTH TWICE A DAY AS NEEDED FOR ITCHING  . lactulose (CHRONULAC) 10 GM/15ML solution Take 2 tbsp by mouth 2 times a day  . levothyroxine (SYNTHROID, LEVOTHROID) 200 MCG tablet Take 1 tablet (200 mcg total) by mouth daily before breakfast.  . loratadine (CLARITIN) 10 MG tablet TAKE 1 TABLET (10 MG TOTAL) BY MOUTH DAILY.  . metroNIDAZOLE (METROGEL) 0.75 % gel Apply 1 application topically 2 (two) times daily. For 5 days  . montelukast (SINGULAIR) 10 MG tablet Take 1 tablet (10 mg total) by mouth daily.  . Multiple Vitamins-Minerals (CENTRUM SILVER ULTRA WOMENS PO) Take by mouth.  . Olopatadine HCl  0.2 % SOLN Apply 1 drop to eye 2 (two) times daily.  Marland Kitchen omeprazole (PRILOSEC) 20 MG capsule TAKE 1 CAPSULE (20 MG TOTAL) BY MOUTH DAILY.  Marland Kitchen propranolol (INDERAL) 60 MG tablet Take 1 tablet (60 mg total) by mouth 2 (two) times daily.  . QUEtiapine (SEROQUEL) 25 MG tablet Take 1 tablet (25 mg total) by mouth at bedtime.  . ranitidine (ZANTAC) 300 MG tablet TAKE 1 TABLET (300 MG TOTAL) BY MOUTH AT BEDTIME.  . rifaximin (XIFAXAN) 550 MG TABS tablet Take 1 tablet (550 mg total) by mouth 2 (two) times daily.  Marland Kitchen spironolactone (ALDACTONE) 100 MG  tablet TAKE 1.5 TABLETS (150 MG TOTAL) BY MOUTH DAILY.  . traMADol (ULTRAM) 50 MG tablet Take 1 tablet (50 mg total) by mouth 2 (two) times daily.  . traZODone (DESYREL) 50 MG tablet TAKE 0.5 TABLETS (25 MG TOTAL) BY MOUTH AT BEDTIME AS NEEDED FOR SLEEP. (Patient taking differently: Take 50 mg by mouth at bedtime as needed for sleep. )  . varenicline (CHANTIX) 0.5 MG tablet Take 1 tablet (0.5 mg total) by mouth 2 (two) times daily.  . [DISCONTINUED] meloxicam (MOBIC) 7.5 MG tablet TAKE 1 TABLET BY MOUTH EVERY DAY  . [DISCONTINUED] spironolactone (ALDACTONE) 100 MG tablet Take 1 tablet (100 mg total) by mouth daily.   Past Medical History:  Diagnosis Date  . Allergic rhinitis   . Anxiety and depression   . Arthritis   . Blood transfusion   . Cataract    MILD  . Clotting disorder ([place])    prolonged clotting time due to liver disease  . Compression fracture 09/21/2013  . 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 Esophageal stricture    esophageal dysmotility and chronic dysphagia as well  . GERD (gastroesophageal reflux disease)   . Hepatic cirrhosis due to chronic hepatitis C infection ([place])    ETOH causative as well  . Hepatic encephalopathy syndrome ([place])   . Hiatal hernia   . History of alcohol abuse   . History of hip fracture   . HTN (hypertension)   . Osteoporosis   . 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 Paraesophageal hernia   . Paraesophageal hiatal hernia 10/17/2015  . 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 Ulcer    2007   Past Surgical History:  Procedure Laterality Date  . BURR HOLE FOR SUBDURAL HEMATOMA  2004  . COLONOSCOPY  08/2012   moderatel left colon tics  . ESOPHAGOGASTRODUODENOSCOPY (EGD) WITH PROPOFOL N/A 01/26/2014   Procedure: ESOPHAGOGASTRODUODENOSCOPY (EGD) WITH PROPOFOL;  Surgeon: Lafayette Dragon, MD;  [facility] ENDOSCOPY;  Service: Endoscopy;  Laterality:  N/A;  . KYPHOPLASTY Bilateral 09/21/2013   Procedure: T12 - L1 KYPHOPLASTY;  Surgeon: Melina Schools, MD;  Location: [place];  Service: Orthopedics;  Laterality: Bilateral;  . NASAL SINUS SURGERY    . ORIF HIP FRACTURE  2010   right x2  . SPLENECTOMY     age 54  . UPPER GASTROINTESTINAL ENDOSCOPY  08/05/2007   esophageal ring, hiatal hernia, portal gastropathy  . UPPER GASTROINTESTINAL ENDOSCOPY  10/14/2011   Social History   Social History  . Marital status: Widowed    Spouse name: N/A  . Number of children: 2  . Years of education: 12+   Occupational History  . Disabled    Social History Main Topics  . Smoking status: Current Every Day Smoker    Packs/day: 0.25  Years: 10.00    Types: Cigarettes  . Smokeless tobacco: Never Used     Comment: reduce # of cigarettes  . Alcohol use No     Comment: history of attending AA  . Drug use: No  . Sexual activity: No   Other Topics Concern  . None   Social History Narrative   Just before her husband died, she had a miscarriage and started to drink beer heavily.  She quit secondary to her liver disease and has been sober for several years.  She denies the use of drugs - prescription or otherwise.     She does not drive.   She lives in [place].    She has an associates degree.    Many of her bills are paid automatically by the bank.  Her SSI checks deposit directly into her banck account.    Patient lives with a female friend in a house that she owns.    Her friend manages her medications for her using a pill box.       Updated 08/27/16   family history includes Anxiety disorder in her mother; Cancer in her maternal grandmother; Heart disease in her mother; Kidney failure in her sister; Other in her daughter and daughter.  Review of Systems Dental pain, as per HPI  Objective:   Physical Exam BP 100/70   Pulse 64   Ht 5\' 2"  (1.575 m)   Wt 216 lb (98 kg)   BMI 39.51 kg/m  NAD Eyes anicteric Lungs cta Cor s1s2 no  rmg abd obese soft and NT Neuro alert and oriented x 3 and no asterixis  Data reviewed: Labs and note from 10/2016 [facility] liver clinic note 01/22/2017 [place] - cirrhosis, sl ascites, no masses

## 2017-01-25 NOTE — Assessment & Plan Note (Signed)
stable °

## 2017-01-26 ENCOUNTER — Telehealth: Payer: Self-pay | Admitting: Internal Medicine

## 2017-01-26 NOTE — Telephone Encounter (Signed)
See questions from patient.  She is asking for refill of phenergan, and if you think CBD oil would help her?

## 2017-01-27 ENCOUNTER — Other Ambulatory Visit: Payer: Self-pay | Admitting: Internal Medicine

## 2017-01-27 ENCOUNTER — Ambulatory Visit: Payer: Medicare Other | Admitting: Internal Medicine

## 2017-01-27 ENCOUNTER — Ambulatory Visit: Payer: Medicare Other | Admitting: *Deleted

## 2017-01-27 MED ORDER — PROMETHAZINE HCL 25 MG PO TABS: ORAL_TABLET | ORAL | 0 refills | Status: DC

## 2017-01-27 NOTE — Telephone Encounter (Signed)
Promethazine 12.5 mg every 8 hrs prn nausea # 30 no refill

## 2017-01-27 NOTE — Telephone Encounter (Signed)
Patient notified

## 2017-01-27 NOTE — Telephone Encounter (Signed)
She says the two of you discussed her nausea at the office visit.  She doesn't have phenergan at home, she is requesting it for her nausea.  She is notified not to take CBD oil.

## 2017-01-27 NOTE — Telephone Encounter (Signed)
I do not see promethazine on active or historical meds - what does she want it for - dosing etc?  What, specifically is she asking about CBD oil - if its liver disease I would say no

## 2017-01-27 NOTE — Telephone Encounter (Signed)
Left message for patient to call back  

## 2017-02-01 ENCOUNTER — Telehealth: Payer: Self-pay | Admitting: Internal Medicine

## 2017-02-01 ENCOUNTER — Other Ambulatory Visit: Payer: Self-pay | Admitting: *Deleted

## 2017-02-01 DIAGNOSIS — F4001 Agoraphobia with panic disorder: Secondary | ICD-10-CM

## 2017-02-01 MED ORDER — CLONAZEPAM 1 MG PO TABS: 0.5000 mg | ORAL_TABLET | Freq: Two times a day (BID) | ORAL | 2 refills | Status: DC | PRN

## 2017-02-01 NOTE — Telephone Encounter (Signed)
Pt wants to know if the new medicine dr put her on would interfere with her sleep. She doesn't know the name of the medication. Pt has a persistant cough and is wheezing.  What can she talk for that?

## 2017-02-01 NOTE — Telephone Encounter (Signed)
Please let patient know if she is wheezing despite taking her prescribed Flonase, Claritin, and Singulair she needs to schedule an appointment. And yes, Chantix can cause insomnia like we discussed during her last appointment. Thanks! - AJL

## 2017-02-02 ENCOUNTER — Telehealth: Payer: Self-pay | Admitting: Internal Medicine

## 2017-02-02 DIAGNOSIS — H04123 Dry eye syndrome of bilateral lacrimal glands: Secondary | ICD-10-CM | POA: Diagnosis not present

## 2017-02-02 NOTE — Telephone Encounter (Signed)
Spoke with patient and advised her to make OV if she is still having wheezing. She states she has an upcoming appointment and will discuss it then. I also told her per note that Chantix can cause insomnia.

## 2017-02-02 NOTE — Telephone Encounter (Signed)
LM to CB for information.

## 2017-02-03 ENCOUNTER — Telehealth: Payer: Self-pay | Admitting: Internal Medicine

## 2017-02-03 DIAGNOSIS — F4001 Agoraphobia with panic disorder: Secondary | ICD-10-CM

## 2017-02-03 NOTE — Telephone Encounter (Signed)
Pt needs a refill on klonopin, she is almost out. [facility] [address]

## 2017-02-04 MED ORDER — CLONAZEPAM 1 MG PO TABS: 0.5000 mg | ORAL_TABLET | Freq: Two times a day (BID) | ORAL | 2 refills | Status: DC | PRN

## 2017-02-04 NOTE — Telephone Encounter (Signed)
Patient is aware that script has been approved and printed but I didn't see it up front for pick up.  Will forward to both the team and provider to see if they have script.  Please call patient once it has been placed up front. Jazmin Hartsell,CMA

## 2017-02-04 NOTE — Telephone Encounter (Signed)
Pt called again. I spoke with Deseree who said to page Avon Gully, so I am waiting to hear back. Once I do I will call pt and let her know. ep

## 2017-02-05 ENCOUNTER — Other Ambulatory Visit: Payer: Self-pay | Admitting: Internal Medicine

## 2017-02-05 DIAGNOSIS — F4001 Agoraphobia with panic disorder: Secondary | ICD-10-CM

## 2017-02-05 MED ORDER — CLONAZEPAM 1 MG PO TABS: 0.5000 mg | ORAL_TABLET | Freq: Two times a day (BID) | ORAL | 2 refills | Status: DC | PRN

## 2017-02-05 NOTE — Progress Notes (Signed)
Left message on voicemail informing patient that rx was ready for pickup .

## 2017-02-09 ENCOUNTER — Telehealth: Payer: Self-pay | Admitting: Internal Medicine

## 2017-02-09 NOTE — Telephone Encounter (Signed)
She is asking if you were able to discuss her with Dr. Monica Martinez? We discussed esophogeal varices as well.  All of her questions were answered.

## 2017-02-10 NOTE — Telephone Encounter (Signed)
It's on my list

## 2017-02-10 NOTE — Telephone Encounter (Signed)
Patient notified that Dr. Carlean Purl will get back with her when he and Dr. Jola Babinski can connect and discuss her case.

## 2017-02-12 ENCOUNTER — Other Ambulatory Visit: Payer: Self-pay | Admitting: Internal Medicine

## 2017-02-12 NOTE — Telephone Encounter (Signed)
Please advise how many refills Sir, thank you. 

## 2017-02-15 ENCOUNTER — Telehealth: Payer: Self-pay | Admitting: Internal Medicine

## 2017-02-15 NOTE — Telephone Encounter (Signed)
Pt states the medication for the shaking is not working. Pt would also like a referral for dermatology, for bumps on skin. Pt states she is seeing black flashes and things that aren't there and would like PCP to call her. ep

## 2017-02-15 NOTE — Telephone Encounter (Signed)
May I refill Sir? 

## 2017-02-16 ENCOUNTER — Telehealth: Payer: Self-pay | Admitting: Internal Medicine

## 2017-02-16 NOTE — Telephone Encounter (Signed)
Refill both 4

## 2017-02-16 NOTE — Telephone Encounter (Signed)
Wendy [name] a voice mail message that rx's have been sent in

## 2017-02-16 NOTE — Telephone Encounter (Signed)
Please advise Sir, thank you. 

## 2017-02-17 ENCOUNTER — Encounter: Payer: Self-pay | Admitting: Internal Medicine

## 2017-02-17 NOTE — Telephone Encounter (Signed)
See other documentation - let her know I spoke with Dr. Monica Martinez  Next appt me should be November not September, sooner prn

## 2017-02-17 NOTE — Telephone Encounter (Signed)
Please let her know that I have spoken with Dr. Monica Martinez and he and I agree that surgery to repair her hiatal hernia is too risky.  Will need to try to manage her ascites with diuretics and she should use a dysphagia 3 diet.  I should see her again in [place]

## 2017-02-18 ENCOUNTER — Telehealth: Payer: Self-pay | Admitting: Internal Medicine

## 2017-02-18 NOTE — Telephone Encounter (Signed)
Patient notified New copy of dysphagia diet mailed to the patient  She is notified to follow up in November for routine or earlier for new concerns

## 2017-02-18 NOTE — Telephone Encounter (Signed)
Left message for patient to call back  

## 2017-02-18 NOTE — Telephone Encounter (Signed)
See other phone note for details. 

## 2017-02-22 ENCOUNTER — Other Ambulatory Visit: Payer: Self-pay | Admitting: Internal Medicine

## 2017-02-22 ENCOUNTER — Ambulatory Visit: Payer: Medicare Other | Admitting: Internal Medicine

## 2017-02-22 DIAGNOSIS — L299 Pruritus, unspecified: Secondary | ICD-10-CM

## 2017-02-23 NOTE — Telephone Encounter (Signed)
Left message for patient to return call.

## 2017-02-24 ENCOUNTER — Other Ambulatory Visit: Payer: Self-pay | Admitting: Internal Medicine

## 2017-02-24 DIAGNOSIS — M549 Dorsalgia, unspecified: Principal | ICD-10-CM

## 2017-02-24 DIAGNOSIS — G8929 Other chronic pain: Secondary | ICD-10-CM

## 2017-02-25 MED ORDER — TRAMADOL HCL 50 MG PO TABS: 50.0000 mg | ORAL_TABLET | Freq: Two times a day (BID) | ORAL | 2 refills | Status: DC

## 2017-02-26 ENCOUNTER — Ambulatory Visit: Payer: Medicare Other | Admitting: Family Medicine

## 2017-02-26 NOTE — Telephone Encounter (Signed)
Patient has an appointment today. Jazmin Hartsell,CMA

## 2017-03-02 ENCOUNTER — Telehealth: Payer: Self-pay

## 2017-03-02 NOTE — Telephone Encounter (Signed)
Please mail list of dentist that take Medicare. Pt had a root canal done, dentist sold the practice and the new dentist doesn't take medicare. Ottis Stain, CMA

## 2017-03-02 NOTE — Telephone Encounter (Signed)
List mailed to patient. [name],CMA

## 2017-03-03 ENCOUNTER — Telehealth: Payer: Self-pay | Admitting: Internal Medicine

## 2017-03-03 ENCOUNTER — Ambulatory Visit: Payer: Self-pay | Admitting: Internal Medicine

## 2017-03-03 NOTE — Telephone Encounter (Signed)
Will forward to MD to make aware. Jazmin Hartsell,CMA  

## 2017-03-03 NOTE — Telephone Encounter (Signed)
If patient would like to be evaluated for her cramps and pain she needs to come to the office. She had an appointment today that she cancelled. She can feel free to make another and we can discuss her current issues.   Adin Hector, MD, MPH PGY-3 [facility] Pager [phone]

## 2017-03-03 NOTE — Telephone Encounter (Signed)
Pt called because she wanted the doctor to know that she is doing okay on quitting smoking. She said that she is still having cramps. Her legs and hip area is very painful. jw

## 2017-03-04 ENCOUNTER — Telehealth: Payer: Self-pay | Admitting: Internal Medicine

## 2017-03-04 NOTE — Telephone Encounter (Signed)
Pt would like a referral to dermatologist. She has an appt July 25 but she says she can only discuss 2 things. cna she get the referral before her appt?   She has also started falling and doesn't know why.  Just wanted dr to know

## 2017-03-05 NOTE — Telephone Encounter (Signed)
Spoke with patient and she wants to be referred for the "growth on her face".  States that she was seen last time in clinic and it has grown since then and is still bothersome.  Will forward to MD to advise. Shantel Helwig,CMA

## 2017-03-08 NOTE — Telephone Encounter (Signed)
LM for patient ok per DPR reminding her of her upcoming appointment on 03-23-17 and that she will have to discuss her dermatology concerns at that time with provider.  Odell Choung,CMA

## 2017-03-10 NOTE — Telephone Encounter (Signed)
Pt never received the list.  Please mail another one.  Also her falls have increased

## 2017-03-12 ENCOUNTER — Telehealth: Payer: Self-pay | Admitting: Internal Medicine

## 2017-03-12 NOTE — Telephone Encounter (Signed)
Yes this is the year to do it  I think she is ok for [place]  Please review and set up direct  Thanks  Dx cirrhosis, screen for varices

## 2017-03-12 NOTE — Telephone Encounter (Signed)
Lat EGD was in 2015.  Does she need screening for varices?

## 2017-03-12 NOTE — Telephone Encounter (Signed)
Left message for patient to call back  

## 2017-03-15 NOTE — Telephone Encounter (Signed)
Left message for patient to call back  

## 2017-03-16 NOTE — Telephone Encounter (Signed)
Patient notified of the recommendations She is scheduled for [place] 04/29/17 and pre-visit

## 2017-03-23 ENCOUNTER — Encounter: Payer: Self-pay | Admitting: Internal Medicine

## 2017-03-23 ENCOUNTER — Ambulatory Visit (INDEPENDENT_AMBULATORY_CARE_PROVIDER_SITE_OTHER): Payer: Medicare Other | Admitting: Internal Medicine

## 2017-03-23 VITALS — BP 96/62 | HR 57 | Temp 98.4°F | Ht 62.0 in | Wt 213.2 lb

## 2017-03-23 DIAGNOSIS — R296 Repeated falls: Secondary | ICD-10-CM | POA: Diagnosis not present

## 2017-03-23 DIAGNOSIS — R251 Tremor, unspecified: Secondary | ICD-10-CM | POA: Diagnosis not present

## 2017-03-23 NOTE — Progress Notes (Signed)
Subjective:   Patient: Wendy [name]       Birthdate: 12/01/1960       MRN: [id number]      HPI  KORAH HUFSTEDLER is a 56 y.o. female presenting for tremors and falls.   Tremors Ongoing for months now. Worsening since last visit. Patient says that recently her friends noticed the tremors while helping her work on a computer (noticed while she was typing). Thinks that symptoms are worse at rest, however also notices symptoms with activities. For instance, feels that her handwriting is messier now than it used to be and is having difficulty writing. Has also been dropping pens when trying to write. Has difficulty holding other things, such as glasses or mugs, but has not dropped anything other than pens. Is still able to complete all activities, though some with more difficulty than others. Has not seen a difference after beginning metoprolol at last visit. No family history of tremors.   Falls Has history of recurrent falls, initially thought to be secondary to polypharmacy. Falling had improved, however has began occurring again over the past two months. Has fallen 6-8 times in the past month. Does not leave her house often, so all falls have occurred in her house. Denies hitting her head or loss of consciousness. Says she knows that she is about to fall but cannot say what exactly it is that makes her fall. Denies tripping over things. Has fallen so forcefully that she has scabs on her knees, arms, and hands. If she falls when home alone, she has to scoot on her buttocks to her bedroom so she can try to pull herself up using the railings of her bed, however even this is very difficult. She has recently purchased a new home alarm system that includes a device to let her alert someone if she falls and no one is home to help her. Patient does live alone but has a close friend who is often at her house.   Smoking status reviewed. Patient is current every day smoker.   Review of Systems See HPI.      Objective:  Physical Exam  Constitutional:  Pleasant female, appears older than stated age  HENT:  Head: Normocephalic and atraumatic.  Eyes: Conjunctivae and EOM are normal. Right eye exhibits no discharge. Left eye exhibits no discharge.  Pulmonary/Chest: Effort normal. No respiratory distress.  Musculoskeletal:  Able to walk, sit, and stand with assistance of rolling walker (patient's baseline).   Neurological:  Fine tremor of both hands, R>L, throughout encounter, more pronounced at rest. Patient able to perform finger to nose without much difficulty bilaterally. No issues with rapid alternating movement. 5/5 strength upper extremities bilaterally. Full grip strength bilaterally. No focal deficits.   Skin: Skin is warm and dry.  Psychiatric: Affect and judgment normal.  Speaks slowly (patient's baseline)      Assessment & Plan:  Recurrent falls Chronic issue, though acutely worsening in past two months. Initially thought 2/2 polypharmacy so certain medications discontinued and patient improved, however now symptoms have returned and are occurring more frequently than before. No head trauma or LOC, however as patient falling so frequently and does live alone significant potential for fall with serious complications. Given worsening falls combined with tremor, concerned for neurological etiology, possibly Parkinson's (see "Tremors"). As such, will refer to neurology with request for patient to be seen within two weeks. Patient instructed to go to ED if fall with head trauma or LOC. Also instructed to  call our office to check on status of referral if she has not heard from neuro office by end of this week.   Tremor Not a new issue, but worsening over past couple months. No improvement with propranolol started at last visit; rather, tremors have become more pronounced and started interfering with daily activities. Tremor not noticeable on prior office visits, however very noticeable  throughout encounter today. Tremor greatest at rest, however also present with intention, and as such is not consistent with typical essential tremor. Given worsening tremors coupled with falls, concern for neurological etiology, possibly Parkinsons. As such will refer to neurology. Patient to continue taking propranolol for time being.   Precepted with Dr. Nori Riis.   Adin Hector, MD, MPH PGY-3 [facility] Pager [phone]

## 2017-03-23 NOTE — Patient Instructions (Addendum)
It was nice seeing you again today Fee!  You will be called with the date and time of your neurology appointment. If you have not been called by the end of the week, please call our office back to ask about the status of your referral.   If you have any falls in the meantime and hit your head or lose consciousness, please call our office or go to the emergency room.   If you have any questions or concerns, please feel free to call the clinic.   Be well,  Dr. Avon Gully

## 2017-03-24 NOTE — Assessment & Plan Note (Signed)
Chronic issue, though acutely worsening in past two months. Initially thought 2/2 polypharmacy so certain medications discontinued and patient improved, however now symptoms have returned and are occurring more frequently than before. No head trauma or LOC, however as patient falling so frequently and does live alone significant potential for fall with serious complications. Given worsening falls combined with tremor, concerned for neurological etiology, possibly Parkinson's (see "Tremors"). As such, will refer to neurology with request for patient to be seen within two weeks. Patient instructed to go to ED if fall with head trauma or LOC. Also instructed to call our office to check on status of referral if she has not heard from neuro office by end of this week.

## 2017-03-24 NOTE — Assessment & Plan Note (Signed)
Not a new issue, but worsening over past couple months. No improvement with propranolol started at last visit; rather, tremors have become more pronounced and started interfering with daily activities. Tremor not noticeable on prior office visits, however very noticeable throughout encounter today. Tremor greatest at rest, however also present with intention, and as such is not consistent with typical essential tremor. Given worsening tremors coupled with falls, concern for neurological etiology, possibly Parkinsons. As such will refer to neurology. Patient to continue taking propranolol for time being.

## 2017-03-25 ENCOUNTER — Encounter: Payer: Self-pay | Admitting: Neurology

## 2017-03-27 ENCOUNTER — Other Ambulatory Visit: Payer: Self-pay | Admitting: Internal Medicine

## 2017-03-29 ENCOUNTER — Telehealth: Payer: Self-pay | Admitting: Internal Medicine

## 2017-03-29 NOTE — Telephone Encounter (Signed)
Patient states that her liver specialist Ned Card states that she does not need and EGD and patient would like to someone about the one she has scheduled for 9.6.18

## 2017-03-29 NOTE — Telephone Encounter (Signed)
Left a message to call back to discuss

## 2017-03-30 DIAGNOSIS — H04123 Dry eye syndrome of bilateral lacrimal glands: Secondary | ICD-10-CM | POA: Diagnosis not present

## 2017-03-31 ENCOUNTER — Ambulatory Visit: Payer: Self-pay | Admitting: Internal Medicine

## 2017-03-31 NOTE — Telephone Encounter (Signed)
Discussed the EGD and the nurse visit with her. Briefly discussed the reasoning behind varices screening and her personal health. She agrees to keep the appointments.

## 2017-04-01 ENCOUNTER — Ambulatory Visit: Payer: Self-pay | Admitting: Family Medicine

## 2017-04-01 ENCOUNTER — Telehealth: Payer: Self-pay | Admitting: Internal Medicine

## 2017-04-01 NOTE — Telephone Encounter (Signed)
Ms.Cutrona called to cancel her appt today due to she is going to be see Dr.Brooks for her increased leg pain. She will call back to reschedule. She wanted her PCP to know. Faythe Dingwall

## 2017-04-02 ENCOUNTER — Other Ambulatory Visit: Payer: Self-pay | Admitting: Internal Medicine

## 2017-04-05 DIAGNOSIS — M4696 Unspecified inflammatory spondylopathy, lumbar region: Secondary | ICD-10-CM | POA: Diagnosis not present

## 2017-04-05 DIAGNOSIS — M461 Sacroiliitis, not elsewhere classified: Secondary | ICD-10-CM | POA: Diagnosis not present

## 2017-04-07 ENCOUNTER — Telehealth: Payer: Self-pay | Admitting: *Deleted

## 2017-04-07 NOTE — Telephone Encounter (Signed)
Patient called to inform PCP that she saw Dr. Nelva Bush' PA (orthopaedic) on 8/13 and it was a really great appt, she learned a lot and is very grateful. Reminded patient of upcoming appt with PCP on 8/22 and with gastro on 8/23. Hubbard Hartshorn, RN, BSN

## 2017-04-08 ENCOUNTER — Other Ambulatory Visit: Payer: Self-pay | Admitting: Allergy and Immunology

## 2017-04-08 ENCOUNTER — Other Ambulatory Visit: Payer: Self-pay | Admitting: Internal Medicine

## 2017-04-08 DIAGNOSIS — L299 Pruritus, unspecified: Secondary | ICD-10-CM

## 2017-04-12 ENCOUNTER — Other Ambulatory Visit: Payer: Self-pay | Admitting: Internal Medicine

## 2017-04-14 ENCOUNTER — Ambulatory Visit: Payer: Medicare Other | Admitting: Internal Medicine

## 2017-04-19 ENCOUNTER — Other Ambulatory Visit: Payer: Self-pay | Admitting: Internal Medicine

## 2017-04-19 ENCOUNTER — Other Ambulatory Visit: Payer: Self-pay | Admitting: Family Medicine

## 2017-04-19 ENCOUNTER — Telehealth: Payer: Self-pay | Admitting: *Deleted

## 2017-04-19 DIAGNOSIS — F4001 Agoraphobia with panic disorder: Secondary | ICD-10-CM

## 2017-04-19 DIAGNOSIS — J309 Allergic rhinitis, unspecified: Secondary | ICD-10-CM

## 2017-04-19 NOTE — Telephone Encounter (Signed)
Patient left voice message on nurse line requesting a call back.  Derl Barrow, RN   Left voice message for patient to return nurse call.  Derl Barrow, RN

## 2017-04-22 ENCOUNTER — Ambulatory Visit (AMBULATORY_SURGERY_CENTER): Payer: Self-pay | Admitting: *Deleted

## 2017-04-22 VITALS — Ht 62.0 in | Wt 228.0 lb

## 2017-04-22 DIAGNOSIS — K7031 Alcoholic cirrhosis of liver with ascites: Secondary | ICD-10-CM

## 2017-04-22 DIAGNOSIS — Z8719 Personal history of other diseases of the digestive system: Secondary | ICD-10-CM

## 2017-04-22 NOTE — Progress Notes (Signed)
Denies allergies to eggs or soy products. Denies complications with sedation or anesthesia. Denies O2 use. Denies use of diet or weight loss medications.  Emmi instructions given for endoscopy.  

## 2017-04-23 ENCOUNTER — Encounter: Payer: Self-pay | Admitting: Internal Medicine

## 2017-04-23 ENCOUNTER — Ambulatory Visit (INDEPENDENT_AMBULATORY_CARE_PROVIDER_SITE_OTHER): Payer: Medicare Other | Admitting: Internal Medicine

## 2017-04-23 ENCOUNTER — Ambulatory Visit (INDEPENDENT_AMBULATORY_CARE_PROVIDER_SITE_OTHER): Payer: Medicare Other | Admitting: Psychology

## 2017-04-23 DIAGNOSIS — F4001 Agoraphobia with panic disorder: Secondary | ICD-10-CM

## 2017-04-23 DIAGNOSIS — R251 Tremor, unspecified: Secondary | ICD-10-CM

## 2017-04-23 MED ORDER — CLONAZEPAM 1 MG PO TABS: 0.5000 mg | ORAL_TABLET | Freq: Two times a day (BID) | ORAL | 2 refills | Status: DC | PRN

## 2017-04-23 NOTE — Progress Notes (Signed)
Patient scheduled self for [facility] (not referred by PCP)  Presenting Issue: Patient presented with anxiety and panic symptoms.   Report of symptoms: Patient reported feeling worried about her health and experiencing panic symptoms such as increasd heart rate, sweating, trembling, and reported fear of leaving her house.   Duration of CURRENT symptoms: 10 years  Age of onset of first mood disturbance: 19 years ago   Impact on function: Sx lead to her not wanting to leave house.   Psychiatric History - Diagnoses: Depression, Panic disorder with agoraphobia - Hospitalizations:  None - Pharmacotherapy: Yes Klonopin/ Lexapro but patient reported medicine is not helping.  - Outpatient therapy: Attempted to get connected with services but was unsuccessful.   Current and history of substance use: Smokes 4 cigarettes per day; denied all other substance use.   GAD7: 19

## 2017-04-23 NOTE — Assessment & Plan Note (Signed)
Using Klonopin daily again in addition to other medications. Has been seeing [facility] which is an improvement. Has appt with [facility] immediately after our appt today.  - Refilled Klonopin - Will encourage patient to continue with [facility] appointments

## 2017-04-23 NOTE — Progress Notes (Signed)
   Subjective:   Patient: Wendy [name]       Birthdate: 09/19/1960       MRN: [id number]      HPI  Wendy [name] is a 56 y.o. female presenting for tremors and med refill.   Tremors Patient reports no improvement in tremors, and has continued to have falls. At last appointment I placed a referral to neurology. Per notes, patient was contacted by [facility] and said she would call back to schedule an appointment. She has not done so. She says she does not remember being contacted by them and no longer has their phone number.   Anxiety Patient requesting Klonopin refill today. Has been using daily. Also has appointment with [facility] today after our appointment to discuss her anxiety.   Smoking status reviewed. Patient is current every day smoker.   Review of Systems See HPI.     Objective:  Physical Exam  Constitutional:  Chronically ill appearing female in NAD  HENT:  Head: Normocephalic and atraumatic.  Eyes: Conjunctivae and EOM are normal. Right eye exhibits no discharge. Left eye exhibits no discharge.  Pulmonary/Chest: Effort normal. No respiratory distress.  Musculoskeletal:  Ambulates slowly with walker  Neurological:  Prominent tremor both at rest and with movement of hands  Skin: Skin is warm and dry.  Psychiatric: Affect and judgment normal.      Assessment & Plan:  Tremor Patient never called back to schedule appt with [facility]. Stressed to her the importance of scheduling this appointment given my concern about possible Parkinson's. Gave patient number for [facility] [facility] office and encouraged her to call immediately after appointment today. Patient voiced understanding.   PANIC DISORDER WITH AGORAPHOBIA Using Klonopin daily again in addition to other medications. Has been seeing [facility] which is an improvement. Has appt with [facility] immediately after our appt today.  - Refilled Klonopin - Will encourage patient to continue with [facility] appointments   Adin Hector, MD, MPH PGY-3 [facility] Pager [phone]

## 2017-04-23 NOTE — Assessment & Plan Note (Signed)
Assessment/Plan Recommendations:  Patient's affect was appropriate and neutral during session; she spoke slowly and at times speaking appeared effortful.   [facility] provided psychoeducation about anxiety and panic and explained the relationship between avoidance and anxiety maintenance. Patient identified with many of the discussed anxiety/panic symptoms and appeared motivated to try some CBT techniques. [facility] demonstrated deep breathing, gave hand out, and patient practiced this in session.   Patient will do deep breathing for 2 minutes during anxiety/panic attacks and is scheduled for follow-up with [facility] in 2 weeks.

## 2017-04-23 NOTE — Assessment & Plan Note (Signed)
Patient never called back to schedule appt with [facility]. Stressed to her the importance of scheduling this appointment given my concern about possible Parkinson's. Gave patient number for [facility] [facility] office and encouraged her to call immediately after appointment today. Patient voiced understanding.

## 2017-04-23 NOTE — Patient Instructions (Addendum)
It was nice seeing you again today Ms. Blok!  Please call [facility] today to schedule your appointment. Their phone number is [phone]. I think it is very important for you to schedule this appointment to evaluate your tremors, so please call them as soon as you can.   If you have any questions or concerns, please feel free to call the clinic.   Be well,  Dr. Avon Gully

## 2017-04-23 NOTE — Patient Instructions (Signed)
Review hand out on deep -breathing. Do deep-breathing for 2 minutes when you experience anxiety.   You are scheduled for follow-up appointment with [facility]  Verdis Frederickson) on Friday, September 14th at 11 AM

## 2017-04-29 ENCOUNTER — Ambulatory Visit (AMBULATORY_SURGERY_CENTER): Payer: Medicare Other | Admitting: Internal Medicine

## 2017-04-29 ENCOUNTER — Encounter: Payer: Self-pay | Admitting: Internal Medicine

## 2017-04-29 VITALS — BP 132/75 | HR 69 | Temp 97.7°F | Resp 12 | Ht 62.0 in | Wt 228.0 lb

## 2017-04-29 DIAGNOSIS — K766 Portal hypertension: Secondary | ICD-10-CM | POA: Diagnosis not present

## 2017-04-29 DIAGNOSIS — K3189 Other diseases of stomach and duodenum: Secondary | ICD-10-CM

## 2017-04-29 DIAGNOSIS — K449 Diaphragmatic hernia without obstruction or gangrene: Secondary | ICD-10-CM | POA: Diagnosis not present

## 2017-04-29 DIAGNOSIS — R1310 Dysphagia, unspecified: Secondary | ICD-10-CM | POA: Diagnosis not present

## 2017-04-29 DIAGNOSIS — Z8719 Personal history of other diseases of the digestive system: Secondary | ICD-10-CM

## 2017-04-29 DIAGNOSIS — I8500 Esophageal varices without bleeding: Secondary | ICD-10-CM | POA: Diagnosis not present

## 2017-04-29 DIAGNOSIS — R1319 Other dysphagia: Secondary | ICD-10-CM

## 2017-04-29 MED ORDER — SPIRONOLACTONE 100 MG PO TABS: 200.0000 mg | ORAL_TABLET | Freq: Every day | ORAL | 3 refills | Status: DC

## 2017-04-29 MED ORDER — SODIUM CHLORIDE 0.9 % IV SOLN: 500.0000 mL | INTRAVENOUS | Status: DC

## 2017-04-29 MED ORDER — FUROSEMIDE 40 MG PO TABS: 80.0000 mg | ORAL_TABLET | Freq: Every day | ORAL | 3 refills | Status: DC

## 2017-04-29 NOTE — Op Note (Addendum)
[name] Procedure Date: 04/29/2017 10:34 AM MRN: [id number] Endoscopist: Gatha Mayer , MD Age: 56 Referring MD:  Date of Birth: 10-Oct-1960 Gender: Female Account #: [id number] Procedure:                Upper GI endoscopy Indications:              Dysphagia, Hiatal hernia, Follow-up of hiatal                            hernia, Follow-up of esophageal varices Medicines:                Monitored Anesthesia Care Procedure:                Pre-Anesthesia Assessment:                           - Prior to the procedure, a History and Physical                            was performed, and patient medications and                            allergies were reviewed. The patient's tolerance of                            previous anesthesia was also reviewed. The risks                            and benefits of the procedure and the sedation                            options and risks were discussed with the patient.                            All questions were answered, and informed consent                            was obtained. Prior Anticoagulants: The patient has                            taken no previous anticoagulant or antiplatelet                            agents. ASA Grade Assessment: III - A patient with                            severe systemic disease. After reviewing the risks                            and benefits, the patient was deemed in                            satisfactory condition to undergo the procedure.  After obtaining informed consent, the endoscope was                            passed under direct vision. Throughout the                            procedure, the patient's blood pressure, pulse, and                            oxygen saturations were monitored continuously. The                            Endoscope was introduced through the mouth, and                            advanced to the  second part of duodenum. The upper                            GI endoscopy was accomplished without difficulty.                            The patient tolerated the procedure well. Scope In: Scope Out: Findings:                 The examined esophagus was moderately tortuous.                           An 8 cm hiatal hernia was present.                           Moderate portal hypertensive gastropathy was found                            in the entire examined stomach.                           A small amount of food (residue) was found in the                            gastric body.                           The exam was otherwise without abnormality.                           The cardia and gastric fundus were normal on                            retroflexion. Complications:            No immediate complications. Estimated Blood Loss:     Estimated blood loss: none. Impression:               - Tortuous esophagus. C/W dysmotility - No varices  evident                           - 8 cm hiatal hernia. No clear paraesophageal                            component today but has on UGI Series                           - Portal hypertensive gastropathy.                           - A small amount of food (residue) in the stomach.                            Suspect decreased emptying due to ascites                           - The examination was otherwise normal.                           - No specimens collected. Recommendation:           - Patient has a contact number available for                            emergencies. The signs and symptoms of potential                            delayed complications were discussed with the                            patient. Return to normal activities tomorrow.                            Written discharge instructions were provided to the                            patient.                           - Resume previous diet.                            - Continue present medications.                           - No repeat upper endoscopy on a routine basis as                            she is on propranolol - exam today to reassess                            dysphagia and hernia also                           -  Return to my office December - she is to call in                            November to get an appointment.                           - In effort to reduce ascites - will increase                            spironolactone to 200 mg daily and furosemide to 8                            mg daily                           BMET and weight in 2 weeks my office Gatha Mayer, MD 04/29/2017 11:02:17 AM This report has been signed electronically.

## 2017-04-29 NOTE — Progress Notes (Signed)
A/ox3 pleased with MAC, report to [place]

## 2017-04-29 NOTE — Progress Notes (Signed)
Pt's states no medical or surgical changes since previsit or office visit. 

## 2017-04-29 NOTE — Patient Instructions (Addendum)
The large hiatal hernia remains and the esophagus looks like it does not squeeze well - esophageal dysmotility.  There were no varices seen - the stomach does show changes of extra blood flow called portal gastropathy.  I do not plan to keep looking on a routine basis.  I am changing the furosemide and spironolactone to try to get more fluid off - we will contact you about coming back for labs and a weight in 2 weeks.  Please call in early November to get an appointment in December or January.  I appreciate the opportunity to care for you. Gatha Mayer, MD, FACG YOU HAD AN ENDOSCOPIC PROCEDURE TODAY AT [facility]:   Refer to the procedure report that was given to you for any specific questions about what was found during the examination.  If the procedure report does not answer your questions, please call your gastroenterologist to clarify.  If you requested that your care partner not be given the details of your procedure findings, then the procedure report has been included in a sealed envelope for you to review at your convenience later.  YOU SHOULD EXPECT: Some feelings of bloating in the abdomen. Passage of more gas than usual.  Walking can help get rid of the air that was put into your GI tract during the procedure and reduce the bloating. If you had a lower endoscopy (such as a colonoscopy or flexible sigmoidoscopy) you may notice spotting of blood in your stool or on the toilet paper. If you underwent a bowel prep for your procedure, you may not have a normal bowel movement for a few days.  Please Note:  You might notice some irritation and congestion in your nose or some drainage.  This is from the oxygen used during your procedure.  There is no need for concern and it should clear up in a day or so.  SYMPTOMS TO REPORT IMMEDIATELY:   Following upper endoscopy (EGD)  Vomiting of blood or coffee ground material  New chest pain or pain under the shoulder  blades  Painful or persistently difficult swallowing  New shortness of breath  Fever of 100F or higher  Black, tarry-looking stools  For urgent or emergent issues, a gastroenterologist can be reached at any hour by calling [phone].   DIET:  We do recommend a small meal at first, but then you may proceed to your regular diet.  Drink plenty of fluids but you should avoid alcoholic beverages for 24 hours.  MEDICATIONS: Continue present medications.  Please see handouts given to you by your recovery nurse.  FOLLOW UP:  Please follow up in Dr. Celesta Aver office in December - call office in November to schedule an appointment.  ACTIVITY:  You should plan to take it easy for the rest of today and you should NOT DRIVE or use heavy machinery until tomorrow (because of the sedation medicines used during the test).    FOLLOW UP: Our staff will call the number listed on your records the next business day following your procedure to check on you and address any questions or concerns that you may have regarding the information given to you following your procedure. If we do not reach you, we will leave a message.  However, if you are feeling well and you are not experiencing any problems, there is no need to return our call.  We will assume that you have returned to your regular daily activities without incident.  If any  biopsies were taken you will be contacted by phone or by letter within the next 1-3 weeks.  Please call us at [phone] if you have not heard about the biopsies in 3 weeks.   Thank you for allowing [place] to provide for your healthcare needs today.  SIGNATURES/CONFIDENTIALITY: You and/or your care partner have signed paperwork which will be entered into your electronic medical record.  These signatures attest to the fact that that the information above on your After Visit Summary has been reviewed and is understood.  Full responsibility of the confidentiality of this discharge  information lies with you and/or your care-partner.

## 2017-04-30 ENCOUNTER — Telehealth: Payer: Self-pay | Admitting: *Deleted

## 2017-04-30 ENCOUNTER — Telehealth: Payer: Self-pay

## 2017-04-30 DIAGNOSIS — R188 Other ascites: Secondary | ICD-10-CM

## 2017-04-30 DIAGNOSIS — K7469 Other cirrhosis of liver: Secondary | ICD-10-CM

## 2017-04-30 NOTE — Telephone Encounter (Signed)
No answer, message left for the patient. 

## 2017-04-30 NOTE — Telephone Encounter (Signed)
Patient will come on 05/13/17 10:00 for weight check and labs

## 2017-04-30 NOTE — Telephone Encounter (Signed)
-----   Message from Gatha Mayer, MD sent at 04/29/2017 11:12 AM EDT ----- Regarding: labs and weight in 2 weeks See egd but BMET dx cirrhosis, ascites in 2 weeks and I want her to weigh in also

## 2017-04-30 NOTE — Telephone Encounter (Signed)
  Follow up Call-  Call back number 04/29/2017  Post procedure Call Back phone  # [phone]  Permission to leave phone message Yes  Some recent data might be hidden    [employer]

## 2017-05-03 ENCOUNTER — Telehealth: Payer: Self-pay | Admitting: Internal Medicine

## 2017-05-03 NOTE — Telephone Encounter (Signed)
Patient reports since procedure she has had pedal edema and stomach edema "I am all swelled up". She is having difficulty walking.  Her current dose of furosemide 80 mg daily and aldactone 100 mg daily

## 2017-05-04 NOTE — Telephone Encounter (Signed)
Patient reports that she is taking spironolactone 200 mg and 80 mg daily.  She reports this has not changed her edema at all.  She is still very uncomfortable.

## 2017-05-04 NOTE — Telephone Encounter (Signed)
Just to double check did she increase spironolactone to 200 mg and furosemide to 80 mg as instructed and prescribed at EGD?

## 2017-05-04 NOTE — Telephone Encounter (Signed)
Patient notified  She will call back for other GI concerns

## 2017-05-04 NOTE — Telephone Encounter (Signed)
It takes some time to help She can see her PCP if swelling is bothering her more but may take a few days for diuretics to work  Should not be having severe pain in legs if is or there is a unilateral problem could be blood clot so should see PCP or if severe can go to ED

## 2017-05-05 ENCOUNTER — Ambulatory Visit: Payer: Self-pay | Admitting: Neurology

## 2017-05-07 ENCOUNTER — Ambulatory Visit: Payer: Self-pay

## 2017-05-09 ENCOUNTER — Telehealth: Payer: Self-pay | Admitting: Family Medicine

## 2017-05-09 NOTE — Telephone Encounter (Signed)
**  After Hours/ Emergency Line Call*  Received a call to report that Wendy [name] notes that her belly button is leaking. In the past she reports that she had had an ultrasound on this that "showed that it wasn't urinary," and she was given antibiotics.  This started this afternoon. She notes her power is out. She states she doesn't have transportation. Notes increased abdominal swelling since Friday; "looks pregnant." Hx of esophageal varices and hepatic encephalopathy noted on problem list. Denies fever. Of note, speech is slow, unsure if this is patient's baseline. Denies respiratory changes. Recommended that she come into the ED to be evaluated. She states she will try to find a friend to bring her in or call 911. Red flags discussed.  Will forward to PCP.  Ralene Ok, MD PGY-2, [facility]

## 2017-05-10 ENCOUNTER — Emergency Department (HOSPITAL_COMMUNITY)
Admission: EM | Admit: 2017-05-10 | Discharge: 2017-05-10 | Disposition: A | Discharge status: Home / Self Care | Payer: Medicare Other | Attending: Emergency Medicine | Admitting: Emergency Medicine

## 2017-05-10 ENCOUNTER — Ambulatory Visit: Payer: Self-pay | Admitting: Internal Medicine

## 2017-05-10 ENCOUNTER — Encounter (HOSPITAL_COMMUNITY): Payer: Self-pay | Admitting: Emergency Medicine

## 2017-05-10 ENCOUNTER — Telehealth: Payer: Self-pay | Admitting: Internal Medicine

## 2017-05-10 DIAGNOSIS — R140 Abdominal distension (gaseous): Secondary | ICD-10-CM | POA: Insufficient documentation

## 2017-05-10 HISTORY — DX: Esophageal varices without bleeding: I85.00

## 2017-05-10 LAB — URINALYSIS, ROUTINE W REFLEX MICROSCOPIC
BACTERIA UA: NONE SEEN
Bilirubin Urine: NEGATIVE
Glucose, UA: NEGATIVE mg/dL
Hgb urine dipstick: NEGATIVE
Ketones, ur: NEGATIVE mg/dL
Leukocytes, UA: NEGATIVE
Nitrite: NEGATIVE
Protein, ur: NEGATIVE mg/dL
SPECIFIC GRAVITY, URINE: 1.004 — AB (ref 1.005–1.030)
pH: 6 (ref 5.0–8.0)

## 2017-05-10 LAB — COMPREHENSIVE METABOLIC PANEL
ALBUMIN: 2 g/dL — AB (ref 3.5–5.0)
ALK PHOS: 139 U/L — AB (ref 38–126)
ALT: 23 U/L (ref 14–54)
ANION GAP: 8 (ref 5–15)
AST: 53 U/L — ABNORMAL HIGH (ref 15–41)
BUN: 11 mg/dL (ref 6–20)
CALCIUM: 8.3 mg/dL — AB (ref 8.9–10.3)
CO2: 26 mmol/L (ref 22–32)
Chloride: 97 mmol/L — ABNORMAL LOW (ref 101–111)
Creatinine, Ser: 1.2 mg/dL — ABNORMAL HIGH (ref 0.44–1.00)
GFR calc Af Amer: 57 mL/min — ABNORMAL LOW (ref >60)
GFR calc non Af Amer: 49 mL/min — ABNORMAL LOW (ref >60)
GLUCOSE: 95 mg/dL (ref 65–99)
POTASSIUM: 3.8 mmol/L (ref 3.5–5.1)
Sodium: 131 mmol/L — ABNORMAL LOW (ref 135–145)
Total Bilirubin: 1.5 mg/dL — ABNORMAL HIGH (ref 0.3–1.2)
Total Protein: 8.2 g/dL — ABNORMAL HIGH (ref 6.5–8.1)

## 2017-05-10 LAB — CBC
HEMATOCRIT: 35.5 % — AB (ref 36.0–46.0)
HEMOGLOBIN: 12.5 g/dL (ref 12.0–15.0)
MCH: 38.9 pg — AB (ref 26.0–34.0)
MCHC: 35.2 g/dL (ref 30.0–36.0)
MCV: 110.6 fL — ABNORMAL HIGH (ref 78.0–100.0)
Platelets: 198 10*3/uL (ref 150–400)
RBC: 3.21 MIL/uL — ABNORMAL LOW (ref 3.87–5.11)
RDW: 14.9 % (ref 11.5–15.5)
WBC: 5.8 10*3/uL (ref 4.0–10.5)

## 2017-05-10 LAB — LIPASE, BLOOD: Lipase: 37 U/L (ref 11–51)

## 2017-05-10 NOTE — ED Notes (Signed)
Requested urine sample from pt, states she is unable to at this time. Specimen cup provided.

## 2017-05-10 NOTE — ED Notes (Signed)
Pt states she has to leave and will f/u with her Dr.  Pana Cellar for wait, explained delays, and encouraged pt to stay but she states she has to leave.

## 2017-05-10 NOTE — ED Triage Notes (Signed)
Pt presents reports abd distension and "leaking from belly button" Abd pain has been present X1 week, fluid coming from belly button has been present since last night. The fluid is clear and thin, constantly draining. Pt called her PCP and they informed her to come to ED. Pt has hx of cirrhosis, Hep C, ascites. Pt had upper endoscopy on 9/6

## 2017-05-10 NOTE — Telephone Encounter (Signed)
Pt states the weird thing has happened to her belly button again.  She went to ED last night. She  Never say a dr because of the wait.

## 2017-05-12 ENCOUNTER — Encounter: Payer: Self-pay | Admitting: Neurology

## 2017-05-12 ENCOUNTER — Telehealth: Payer: Self-pay | Admitting: Internal Medicine

## 2017-05-12 NOTE — Telephone Encounter (Signed)
Patient called stating she was seen in the ED on Sunday for belly button leakage.  Patient did not stay in the ED stating it was over crowd. Now she is worried that the leakage has been going on long enough and wanted to know if she had to wait until tomorrow. Advised patient that she should have waited to be evaluated by a ED doctor.  Patient denies fever or blood drainage. Advised patient that she can wait until tomorrow. If she develop a fever or increased in drainage or it start to bleed to go back to the ED.  Derl Barrow, RN

## 2017-05-12 NOTE — Telephone Encounter (Signed)
Left message for patient to call back  

## 2017-05-12 NOTE — Telephone Encounter (Signed)
See notes from primary care and the patient.  She has been communicating with primary care the last time this am.  She c/o fluid leaking from her abdomen " from my bellybutton".  She went to the ED on 05/10/17 as advised by primary care, but left without being seen because they were behind.  She is still c/o pain with ambulation "I can't walk"  and leaking from her umbilicus.  She is reminded of our conversation from 05/04/17 that she should see her primary care about pain in her legs.  She is advised that she should return to the ED for evaluation.  Patient also seemed a little more confused than her baseline. She reports that she does not have a ride to the ED and relays that the reason she left the ED on 05/10/17 without being seen was due to a lack of a ride home. I advised her she should take a cab or if needed call an ambulance.

## 2017-05-13 ENCOUNTER — Ambulatory Visit: Payer: Self-pay | Admitting: Internal Medicine

## 2017-05-13 NOTE — Telephone Encounter (Signed)
I called and did not get an answer (got voicemail)  Please try again in the next hour and if no answer I suggest that we have law enforcement check her home to see if she is there and needs to go to hospital  I could not get her contact person either

## 2017-05-13 NOTE — Telephone Encounter (Signed)
She called back, same sxs as yesterday  Agreed to go to [facility] ED today

## 2017-05-16 ENCOUNTER — Telehealth: Payer: Self-pay | Admitting: Family Medicine

## 2017-05-16 NOTE — Telephone Encounter (Signed)
**  After Hours/ Emergency Line Call*  Received a call to report that Wendy [name] was looking for Dr. Carlean Purl, her gastroenterologist. Per chart review she has made multiple after hours calls and calls to our office as well as gastroenterology office concerning for "bellybutton leaking".  She is confused as to whether she should be coming to the emergency department or following up in the clinic. She reports that her pain has improved and she is no longer concerned about any leakage. She denies any shortness of breath, fever or chills or worsening pain. Recommended that if she develops any of these symptoms then she should be seen in the emergency department, otherwise she can follow-up in our office or the gastroenterology office with Dr. Carlean Purl. Will forward to PCP.  Eloise Levels, MD PGY-2, [place] [facility]

## 2017-05-17 ENCOUNTER — Telehealth: Payer: Self-pay | Admitting: Internal Medicine

## 2017-05-18 NOTE — Telephone Encounter (Signed)
I left a message for the patient to call back to reschedule at her convenience.

## 2017-05-20 ENCOUNTER — Ambulatory Visit (HOSPITAL_COMMUNITY): Payer: Self-pay | Admitting: Psychiatry

## 2017-05-20 ENCOUNTER — Other Ambulatory Visit: Payer: Self-pay

## 2017-05-20 MED ORDER — FUROSEMIDE 40 MG PO TABS: 80.0000 mg | ORAL_TABLET | Freq: Every day | ORAL | 3 refills | Status: DC

## 2017-05-20 NOTE — Telephone Encounter (Signed)
[facility] sent fax requesting we send in a new script for patients furosemide saying 2 qd not one BID since that is how she is currently taking it.  I sent this as requested.

## 2017-05-24 ENCOUNTER — Telehealth: Payer: Self-pay

## 2017-05-24 NOTE — Telephone Encounter (Signed)
Current dose is 2 x 40 mg tablets each AM (80 mg qd)

## 2017-05-24 NOTE — Telephone Encounter (Signed)
Spoke with the pharmacist and gave her the correct dosage on the furosemide 40mg  tablets.  She is going to call the patient for [place] and fill the rx .

## 2017-05-24 NOTE — Telephone Encounter (Signed)
Please clarify how she is to be taking her lasix 40mg  tablets?  Thank you. Pharmacy needs [place] to clarify Sir, thank you.

## 2017-05-29 ENCOUNTER — Other Ambulatory Visit: Payer: Self-pay | Admitting: Internal Medicine

## 2017-05-29 DIAGNOSIS — L299 Pruritus, unspecified: Secondary | ICD-10-CM

## 2017-05-30 ENCOUNTER — Other Ambulatory Visit: Payer: Self-pay | Admitting: Internal Medicine

## 2017-06-03 ENCOUNTER — Telehealth: Payer: Self-pay | Admitting: Internal Medicine

## 2017-06-03 NOTE — Telephone Encounter (Signed)
Patient calling to ask about who performed an epidural injection on her.  I explained that I do not see any information about injections and that she should contact her primary care or orthopedic MD and ask if they helped arrange injections

## 2017-06-04 ENCOUNTER — Telehealth: Payer: Self-pay | Admitting: *Deleted

## 2017-06-04 NOTE — Telephone Encounter (Signed)
Called patient back. No answer but left voicemail. I see no notes regarding any injections that she has received and she likely will need to cal her orthopedist to inquire about this, however will try to call again later this AM.   Adin Hector, MD, MPH PGY-3 [facility] Pager [phone]

## 2017-06-04 NOTE — Telephone Encounter (Signed)
Spoke with patient. She received what sounds like an epidural injection in April and says she had a reaction to this. She is interested in having another injection but does not want to do so if the reaction was indeed to the injection. She does not remember who did the injection or where it was performed and was wondering if we had any record of this. She has already called Dr. Carlean Purl (GI) and was told that they do not perform injections there. She has been seen by [facility] in the past and says she called there as well and was told that they were not sure who performed the injection. I informed her that we do not do epidural injections at this office so it would not have been performed here. Also informed patient that we do not have access to [facility] notes, so unfortunately I am unable to tell if the injection was performed there or not. Advised patient to call [facility] again to explain the situation to them once more and double check that they do not have record of this. Patient amenable to this and appreciative of the return phone call.   Adin Hector, MD, MPH PGY-3 [place] [facility] Pager [phone]

## 2017-06-04 NOTE — Telephone Encounter (Signed)
Patient would like to talk to Dr. Avon Gully about a "person problem" having to do with a "discrepancy in who gave what" in terms of a "big shot". She thinks she may have had a reaction and wants to talk about this. She declined an appointment.  She's currently using a cell phone that is not her usual phone: [phone]

## 2017-06-05 ENCOUNTER — Other Ambulatory Visit: Payer: Self-pay | Admitting: Family Medicine

## 2017-06-10 ENCOUNTER — Other Ambulatory Visit: Payer: Self-pay | Admitting: *Deleted

## 2017-06-10 ENCOUNTER — Ambulatory Visit: Payer: Self-pay | Admitting: Neurology

## 2017-06-10 ENCOUNTER — Encounter: Payer: Self-pay | Admitting: Neurology

## 2017-06-10 DIAGNOSIS — M549 Dorsalgia, unspecified: Principal | ICD-10-CM

## 2017-06-10 DIAGNOSIS — G8929 Other chronic pain: Secondary | ICD-10-CM

## 2017-06-10 MED ORDER — TRAMADOL HCL 50 MG PO TABS: 50.0000 mg | ORAL_TABLET | Freq: Two times a day (BID) | ORAL | 2 refills | Status: DC

## 2017-06-10 NOTE — Telephone Encounter (Signed)
Called to inform patient that her Tramadol prescription is ready to be picked up and also to offer appointment in [facility] clinic. Patient's partner answered the phone and said she was asleep. Informed him of prescription which he said he would come pick up. Also discussed [place] clinic, and he thinks patient would be interested, as she enjoyed this last year. He was unsure which appointment time she would prefer, and said he would have her call to schedule the appointment when she woke up.   Please schedule patient in [facility] appointment slot on Mon 10/22 if she calls.   Adin Hector, MD, MPH PGY-3 [place] [facility] Pager [phone]

## 2017-06-11 ENCOUNTER — Telehealth: Payer: Self-pay | Admitting: Neurology

## 2017-06-11 NOTE — Telephone Encounter (Signed)
Patient dismissed from [facility] by Metta Clines DO , effective June 10, 2017. Dismissal letter sent out by certified / registered mail.  daj

## 2017-06-13 ENCOUNTER — Other Ambulatory Visit (HOSPITAL_COMMUNITY): Payer: Self-pay | Admitting: Psychiatry

## 2017-06-13 ENCOUNTER — Other Ambulatory Visit: Payer: Self-pay | Admitting: Internal Medicine

## 2017-06-13 DIAGNOSIS — F331 Major depressive disorder, recurrent, moderate: Secondary | ICD-10-CM

## 2017-06-14 ENCOUNTER — Ambulatory Visit: Payer: Self-pay | Admitting: Internal Medicine

## 2017-06-14 NOTE — Telephone Encounter (Signed)
Please advise if okay to refill Sir? Thank you.

## 2017-06-16 NOTE — Telephone Encounter (Signed)
Wendy [name] is on 2 of the 100mg  spironolactone tablets daily.  Please advise how many refills Sir, thank you.  I made her a November 20th appointment.

## 2017-06-16 NOTE — Telephone Encounter (Signed)
Med list says she is to be on 200 mg qd not 150.  Can you clarify this  We also need to get her in to see me in November or December

## 2017-06-16 NOTE — Telephone Encounter (Signed)
Left detailed message on her home voicemail to call me back.

## 2017-06-18 NOTE — Telephone Encounter (Signed)
Refill 100 mg 2 each day # 60 w/ 5 RF

## 2017-06-21 ENCOUNTER — Other Ambulatory Visit: Payer: Self-pay | Admitting: Internal Medicine

## 2017-06-22 ENCOUNTER — Other Ambulatory Visit: Payer: Self-pay | Admitting: Internal Medicine

## 2017-06-24 ENCOUNTER — Ambulatory Visit (HOSPITAL_COMMUNITY): Payer: Self-pay | Admitting: Psychiatry

## 2017-06-26 ENCOUNTER — Emergency Department (HOSPITAL_COMMUNITY): Payer: Medicare Other

## 2017-06-26 ENCOUNTER — Inpatient Hospital Stay (HOSPITAL_COMMUNITY)
Admission: EM | Admit: 2017-06-26 | Discharge: 2017-06-29 | DRG: 441 | Disposition: A | Discharge status: Home Health | Payer: Medicare Other | Attending: Family Medicine | Admitting: Family Medicine

## 2017-06-26 ENCOUNTER — Encounter (HOSPITAL_COMMUNITY): Payer: Self-pay | Admitting: Emergency Medicine

## 2017-06-26 DIAGNOSIS — F17210 Nicotine dependence, cigarettes, uncomplicated: Secondary | ICD-10-CM | POA: Diagnosis present

## 2017-06-26 DIAGNOSIS — I8500 Esophageal varices without bleeding: Secondary | ICD-10-CM | POA: Diagnosis present

## 2017-06-26 DIAGNOSIS — B182 Chronic viral hepatitis C: Secondary | ICD-10-CM | POA: Diagnosis present

## 2017-06-26 DIAGNOSIS — Z79899 Other long term (current) drug therapy: Secondary | ICD-10-CM

## 2017-06-26 DIAGNOSIS — Z87820 Personal history of traumatic brain injury: Secondary | ICD-10-CM

## 2017-06-26 DIAGNOSIS — R4182 Altered mental status, unspecified: Secondary | ICD-10-CM | POA: Diagnosis present

## 2017-06-26 DIAGNOSIS — I129 Hypertensive chronic kidney disease with stage 1 through stage 4 chronic kidney disease, or unspecified chronic kidney disease: Secondary | ICD-10-CM | POA: Diagnosis not present

## 2017-06-26 DIAGNOSIS — F329 Major depressive disorder, single episode, unspecified: Secondary | ICD-10-CM | POA: Diagnosis present

## 2017-06-26 DIAGNOSIS — Z8249 Family history of ischemic heart disease and other diseases of the circulatory system: Secondary | ICD-10-CM

## 2017-06-26 DIAGNOSIS — K7682 Hepatic encephalopathy: Secondary | ICD-10-CM | POA: Diagnosis present

## 2017-06-26 DIAGNOSIS — L03311 Cellulitis of abdominal wall: Secondary | ICD-10-CM | POA: Diagnosis present

## 2017-06-26 DIAGNOSIS — F331 Major depressive disorder, recurrent, moderate: Secondary | ICD-10-CM

## 2017-06-26 DIAGNOSIS — N183 Chronic kidney disease, stage 3 (moderate): Secondary | ICD-10-CM | POA: Diagnosis present

## 2017-06-26 DIAGNOSIS — E039 Hypothyroidism, unspecified: Secondary | ICD-10-CM | POA: Diagnosis not present

## 2017-06-26 DIAGNOSIS — Z9081 Acquired absence of spleen: Secondary | ICD-10-CM

## 2017-06-26 DIAGNOSIS — D638 Anemia in other chronic diseases classified elsewhere: Secondary | ICD-10-CM | POA: Diagnosis present

## 2017-06-26 DIAGNOSIS — Z885 Allergy status to narcotic agent status: Secondary | ICD-10-CM

## 2017-06-26 DIAGNOSIS — J181 Lobar pneumonia, unspecified organism: Secondary | ICD-10-CM | POA: Diagnosis not present

## 2017-06-26 DIAGNOSIS — Z79890 Hormone replacement therapy (postmenopausal): Secondary | ICD-10-CM

## 2017-06-26 DIAGNOSIS — Z79891 Long term (current) use of opiate analgesic: Secondary | ICD-10-CM

## 2017-06-26 DIAGNOSIS — R140 Abdominal distension (gaseous): Secondary | ICD-10-CM | POA: Diagnosis not present

## 2017-06-26 DIAGNOSIS — K219 Gastro-esophageal reflux disease without esophagitis: Secondary | ICD-10-CM | POA: Diagnosis not present

## 2017-06-26 DIAGNOSIS — Z7951 Long term (current) use of inhaled steroids: Secondary | ICD-10-CM

## 2017-06-26 DIAGNOSIS — G4700 Insomnia, unspecified: Secondary | ICD-10-CM | POA: Diagnosis present

## 2017-06-26 DIAGNOSIS — J189 Pneumonia, unspecified organism: Secondary | ICD-10-CM | POA: Diagnosis present

## 2017-06-26 DIAGNOSIS — D539 Nutritional anemia, unspecified: Secondary | ICD-10-CM | POA: Diagnosis not present

## 2017-06-26 DIAGNOSIS — J309 Allergic rhinitis, unspecified: Secondary | ICD-10-CM | POA: Diagnosis present

## 2017-06-26 DIAGNOSIS — G4733 Obstructive sleep apnea (adult) (pediatric): Secondary | ICD-10-CM | POA: Diagnosis present

## 2017-06-26 DIAGNOSIS — E871 Hypo-osmolality and hyponatremia: Secondary | ICD-10-CM | POA: Diagnosis not present

## 2017-06-26 DIAGNOSIS — R188 Other ascites: Secondary | ICD-10-CM | POA: Diagnosis not present

## 2017-06-26 DIAGNOSIS — M810 Age-related osteoporosis without current pathological fracture: Secondary | ICD-10-CM | POA: Diagnosis not present

## 2017-06-26 DIAGNOSIS — F4001 Agoraphobia with panic disorder: Secondary | ICD-10-CM | POA: Diagnosis present

## 2017-06-26 DIAGNOSIS — K7290 Hepatic failure, unspecified without coma: Principal | ICD-10-CM | POA: Diagnosis present

## 2017-06-26 DIAGNOSIS — K449 Diaphragmatic hernia without obstruction or gangrene: Secondary | ICD-10-CM | POA: Diagnosis not present

## 2017-06-26 DIAGNOSIS — Z9114 Patient's other noncompliance with medication regimen: Secondary | ICD-10-CM

## 2017-06-26 DIAGNOSIS — K7460 Unspecified cirrhosis of liver: Secondary | ICD-10-CM | POA: Diagnosis not present

## 2017-06-26 DIAGNOSIS — Z818 Family history of other mental and behavioral disorders: Secondary | ICD-10-CM

## 2017-06-26 DIAGNOSIS — F0280 Dementia in other diseases classified elsewhere without behavioral disturbance: Secondary | ICD-10-CM | POA: Diagnosis present

## 2017-06-26 DIAGNOSIS — R1084 Generalized abdominal pain: Secondary | ICD-10-CM | POA: Diagnosis not present

## 2017-06-26 DIAGNOSIS — R918 Other nonspecific abnormal finding of lung field: Secondary | ICD-10-CM | POA: Diagnosis not present

## 2017-06-26 LAB — RAPID URINE DRUG SCREEN, HOSP PERFORMED
AMPHETAMINES: NOT DETECTED
BENZODIAZEPINES: NOT DETECTED
Barbiturates: NOT DETECTED
Cocaine: NOT DETECTED
Opiates: NOT DETECTED
TETRAHYDROCANNABINOL: NOT DETECTED

## 2017-06-26 LAB — CBC WITH DIFFERENTIAL/PLATELET
BASOS ABS: 0 10*3/uL (ref 0.0–0.1)
Basophils Relative: 0 %
Eosinophils Absolute: 0.1 10*3/uL (ref 0.0–0.7)
Eosinophils Relative: 2 %
HCT: 29.8 % — ABNORMAL LOW (ref 36.0–46.0)
Hemoglobin: 10.3 g/dL — ABNORMAL LOW (ref 12.0–15.0)
LYMPHS ABS: 1.2 10*3/uL (ref 0.7–4.0)
Lymphocytes Relative: 17 %
MCH: 39.2 pg — ABNORMAL HIGH (ref 26.0–34.0)
MCHC: 34.6 g/dL (ref 30.0–36.0)
MCV: 113.3 fL — ABNORMAL HIGH (ref 78.0–100.0)
MONO ABS: 1.6 10*3/uL — AB (ref 0.1–1.0)
Monocytes Relative: 23 %
NEUTROS ABS: 4.1 10*3/uL (ref 1.7–7.7)
Neutrophils Relative %: 58 %
PLATELETS: 193 10*3/uL (ref 150–400)
RBC: 2.63 MIL/uL — AB (ref 3.87–5.11)
RDW: 16 % — AB (ref 11.5–15.5)
WBC: 7 10*3/uL (ref 4.0–10.5)

## 2017-06-26 LAB — URINALYSIS, ROUTINE W REFLEX MICROSCOPIC
Bilirubin Urine: NEGATIVE
GLUCOSE, UA: NEGATIVE mg/dL
HGB URINE DIPSTICK: NEGATIVE
KETONES UR: NEGATIVE mg/dL
Leukocytes, UA: NEGATIVE
Nitrite: NEGATIVE
PH: 6 (ref 5.0–8.0)
PROTEIN: NEGATIVE mg/dL
Specific Gravity, Urine: 1.002 — ABNORMAL LOW (ref 1.005–1.030)

## 2017-06-26 LAB — LIPASE, BLOOD: LIPASE: 31 U/L (ref 11–51)

## 2017-06-26 LAB — COMPREHENSIVE METABOLIC PANEL
ALK PHOS: 136 U/L — AB (ref 38–126)
ALT: 30 U/L (ref 14–54)
AST: 59 U/L — AB (ref 15–41)
Albumin: 2.5 g/dL — ABNORMAL LOW (ref 3.5–5.0)
Anion gap: 8 (ref 5–15)
BILIRUBIN TOTAL: 2.5 mg/dL — AB (ref 0.3–1.2)
BUN: 19 mg/dL (ref 6–20)
CALCIUM: 8.7 mg/dL — AB (ref 8.9–10.3)
CO2: 21 mmol/L — ABNORMAL LOW (ref 22–32)
CREATININE: 1.17 mg/dL — AB (ref 0.44–1.00)
Chloride: 100 mmol/L — ABNORMAL LOW (ref 101–111)
GFR calc Af Amer: 59 mL/min — ABNORMAL LOW (ref >60)
GFR, EST NON AFRICAN AMERICAN: 51 mL/min — AB (ref >60)
Glucose, Bld: 167 mg/dL — ABNORMAL HIGH (ref 65–99)
Potassium: 3.9 mmol/L (ref 3.5–5.1)
Sodium: 129 mmol/L — ABNORMAL LOW (ref 135–145)
TOTAL PROTEIN: 7.8 g/dL (ref 6.5–8.1)

## 2017-06-26 LAB — SALICYLATE LEVEL

## 2017-06-26 LAB — ACETAMINOPHEN LEVEL

## 2017-06-26 LAB — PROTIME-INR
INR: 1.44
PROTHROMBIN TIME: 17.5 s — AB (ref 11.4–15.2)

## 2017-06-26 LAB — ETHANOL: Alcohol, Ethyl (B): <10 mg/dL (ref <10)

## 2017-06-26 LAB — I-STAT CG4 LACTIC ACID, ED: Lactic Acid, Venous: 1.43 mmol/L (ref 0.5–1.9)

## 2017-06-26 LAB — AMMONIA: Ammonia: 70 umol/L — ABNORMAL HIGH (ref 9–35)

## 2017-06-26 MED ORDER — DOXYCYCLINE HYCLATE 100 MG IV SOLR
100.0000 mg | Freq: Once | INTRAVENOUS | Status: AC
  Administered 2017-06-26: 100 mg via INTRAVENOUS
  Filled 2017-06-26: qty 100

## 2017-06-26 MED ORDER — TRAMADOL HCL 50 MG PO TABS
50.0000 mg | ORAL_TABLET | Freq: Two times a day (BID) | ORAL | Status: DC
  Administered 2017-06-26 – 2017-06-28 (×4): 50 mg via ORAL
  Filled 2017-06-26 (×4): qty 1

## 2017-06-26 MED ORDER — HYDROXYZINE HCL 25 MG PO TABS
25.0000 mg | ORAL_TABLET | Freq: Three times a day (TID) | ORAL | Status: DC | PRN
  Administered 2017-06-26 – 2017-06-29 (×2): 25 mg via ORAL
  Filled 2017-06-26 (×3): qty 1

## 2017-06-26 MED ORDER — MONTELUKAST SODIUM 10 MG PO TABS
10.0000 mg | ORAL_TABLET | Freq: Every day | ORAL | Status: DC
  Administered 2017-06-26 – 2017-06-28 (×3): 10 mg via ORAL
  Filled 2017-06-26 (×3): qty 1

## 2017-06-26 MED ORDER — FUROSEMIDE 40 MG PO TABS
80.0000 mg | ORAL_TABLET | Freq: Every day | ORAL | Status: DC
  Administered 2017-06-27 – 2017-06-29 (×3): 80 mg via ORAL
  Filled 2017-06-26 (×3): qty 2

## 2017-06-26 MED ORDER — FLUTICASONE PROPIONATE 50 MCG/ACT NA SUSP
2.0000 | Freq: Every day | NASAL | Status: DC
  Administered 2017-06-27 – 2017-06-29 (×3): 2 via NASAL
  Filled 2017-06-26: qty 16

## 2017-06-26 MED ORDER — CYCLOSPORINE 0.05 % OP EMUL
1.0000 [drp] | Freq: Two times a day (BID) | OPHTHALMIC | Status: DC
  Administered 2017-06-26 – 2017-06-29 (×6): 1 [drp] via OPHTHALMIC
  Filled 2017-06-26 (×7): qty 1

## 2017-06-26 MED ORDER — PROPRANOLOL HCL 10 MG PO TABS
10.0000 mg | ORAL_TABLET | Freq: Two times a day (BID) | ORAL | Status: DC
  Administered 2017-06-26 – 2017-06-29 (×6): 10 mg via ORAL
  Filled 2017-06-26 (×6): qty 1

## 2017-06-26 MED ORDER — LORATADINE 10 MG PO TABS
10.0000 mg | ORAL_TABLET | Freq: Every day | ORAL | Status: DC
  Administered 2017-06-26 – 2017-06-29 (×4): 10 mg via ORAL
  Filled 2017-06-26 (×4): qty 1

## 2017-06-26 MED ORDER — DEXTROSE 5 % IV SOLN
2.0000 g | INTRAVENOUS | Status: DC
  Administered 2017-06-26 – 2017-06-28 (×3): 2 g via INTRAVENOUS
  Filled 2017-06-26 (×4): qty 2

## 2017-06-26 MED ORDER — ESCITALOPRAM OXALATE 10 MG PO TABS
20.0000 mg | ORAL_TABLET | Freq: Every day | ORAL | Status: DC
  Administered 2017-06-27: 20 mg via ORAL
  Filled 2017-06-26: qty 2

## 2017-06-26 MED ORDER — SPIRONOLACTONE 100 MG PO TABS
200.0000 mg | ORAL_TABLET | Freq: Every day | ORAL | Status: DC
  Administered 2017-06-26 – 2017-06-29 (×4): 200 mg via ORAL
  Filled 2017-06-26 (×3): qty 8
  Filled 2017-06-26 (×2): qty 2
  Filled 2017-06-26: qty 8
  Filled 2017-06-26 (×2): qty 2

## 2017-06-26 MED ORDER — RIFAXIMIN 550 MG PO TABS
550.0000 mg | ORAL_TABLET | Freq: Two times a day (BID) | ORAL | Status: DC
  Administered 2017-06-26 – 2017-06-29 (×6): 550 mg via ORAL
  Filled 2017-06-26 (×7): qty 1

## 2017-06-26 MED ORDER — LACTULOSE 10 GM/15ML PO SOLN
10.0000 g | Freq: Once | ORAL | Status: AC
  Administered 2017-06-26: 10 g via ORAL
  Filled 2017-06-26: qty 30

## 2017-06-26 MED ORDER — PANTOPRAZOLE SODIUM 40 MG PO TBEC
40.0000 mg | DELAYED_RELEASE_TABLET | Freq: Every day | ORAL | Status: DC
  Administered 2017-06-26 – 2017-06-29 (×4): 40 mg via ORAL
  Filled 2017-06-26 (×4): qty 1

## 2017-06-26 MED ORDER — LEVOTHYROXINE SODIUM 200 MCG PO TABS
200.0000 ug | ORAL_TABLET | Freq: Every day | ORAL | Status: DC
  Administered 2017-06-27 – 2017-06-29 (×3): 200 ug via ORAL
  Filled 2017-06-26 (×2): qty 1
  Filled 2017-06-26: qty 2
  Filled 2017-06-26: qty 1
  Filled 2017-06-26 (×2): qty 2

## 2017-06-26 MED ORDER — LACTULOSE 10 GM/15ML PO SOLN
10.0000 g | Freq: Three times a day (TID) | ORAL | Status: DC
  Administered 2017-06-26 – 2017-06-27 (×3): 10 g via ORAL
  Filled 2017-06-26 (×3): qty 15

## 2017-06-26 MED ORDER — VITAMIN D3 25 MCG (1000 UNIT) PO TABS
2000.0000 [IU] | ORAL_TABLET | Freq: Every day | ORAL | Status: DC
  Administered 2017-06-26 – 2017-06-29 (×4): 2000 [IU] via ORAL
  Filled 2017-06-26 (×4): qty 2

## 2017-06-26 MED ORDER — DOXYCYCLINE HYCLATE 100 MG IV SOLR
100.0000 mg | Freq: Once | INTRAVENOUS | Status: DC
  Filled 2017-06-26: qty 100

## 2017-06-26 NOTE — ED Notes (Signed)
ADMISSION Provider at bedside. 

## 2017-06-26 NOTE — H&P (Signed)
History and Physical  SHARONLEE NINE TMH:[id number] DOB: [birth year] DOA: 06/26/2017  Referring physician: EDP PCP: Verner Mould, MD   Chief Complaint: Altered mental status  HPI: Wendy [name] is a 56 y.o. female   With history of cirrhosis with ascites esophageal varices secondary to hepatitis C and prior alcohol use, sent to the emergency room due to altered mental status.  ED course: Vital signs are stable, patient is very confused not able to provide detailed history.  Basilic work WBC 7, hemoglobin 10, platelet 193, sodium 129, potassium 3.9, BUN 19 ,creatinine 1.17, LFTs mildly elevated but close to baseline, ammonia elevated at 70.  EDP called to request admit the patient.  I have requested abdominal ultrasound and diagnostic paracentesis.  Per EDP not enough to tap.  She is given doxycycline in the ED due to concern of superficial cellulitis anterior abdomen.   Review of Systems:  Detail per HPI, Review of systems are otherwise negative  Past Medical History:  Diagnosis Date  . Allergic rhinitis   . Anxiety and depression   . Arthritis   . Blood transfusion   . Cataract    MILD  . Clotting disorder ([place])    prolonged clotting time due to liver disease  . Compression fracture 09/21/2013  . 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 Esophageal stricture    esophageal dysmotility and chronic dysphagia as well  . Esophageal varices ([place])   . GERD (gastroesophageal reflux disease)   . Hepatic cirrhosis due to chronic hepatitis C infection ([place])    ETOH causative as well  . Hepatic encephalopathy syndrome ([place])   . Hiatal hernia   . History of alcohol abuse   . History of hip fracture   . HTN (hypertension)   . Osteoporosis   . 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 Paraesophageal hernia   . Paraesophageal hiatal hernia 10/17/2015  . 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 Ulcer    2007   Past Surgical History:  Procedure Laterality Date  . BURR HOLE FOR SUBDURAL HEMATOMA  2004  . COLONOSCOPY  08/2012   moderatel left colon tics  . ESOPHAGOGASTRODUODENOSCOPY (EGD) WITH PROPOFOL N/A 01/26/2014   Procedure: ESOPHAGOGASTRODUODENOSCOPY (EGD) WITH PROPOFOL;  Surgeon: Lafayette Dragon, MD;  [facility] ENDOSCOPY;  Service: Endoscopy;  Laterality: N/A;  . KYPHOPLASTY Bilateral 09/21/2013   Procedure: T12 - L1 KYPHOPLASTY;  Surgeon: Melina Schools, MD;  Location: [facility];  Service: Orthopedics;  Laterality: Bilateral;  . NASAL SINUS SURGERY    . ORIF HIP FRACTURE  2010   right x2  . SPLENECTOMY     age 30  . UPPER GASTROINTESTINAL ENDOSCOPY  08/05/2007   esophageal ring, hiatal hernia, portal gastropathy  . UPPER GASTROINTESTINAL ENDOSCOPY  10/14/2011   Social History:  reports that she has been smoking Cigarettes.  She has a 2.50 pack-year smoking history. She has never used smokeless tobacco. She reports that she does not drink alcohol or use drugs. Patient lives at home & is able to participate in activities of daily living independently   Allergies  Allergen Reactions  . Codeine Phosphate Itching  . Codeine Rash    Family History  Problem Relation Age of Onset  . Heart disease Mother   . Anxiety disorder Mother   . Other Daughter        chromosome abnormalit  . Other Daughter  micropthalmia  . Cancer Maternal Grandmother   . Kidney failure Sister   . Colon cancer Neg Hx   . Esophageal cancer Neg Hx   . Rectal cancer Neg Hx   . Stomach cancer Neg Hx       Prior to Admission medications   Medication Sig Start Date End Date Taking? Authorizing Provider  acetaminophen (TYLENOL 8 HOUR) 650 MG CR tablet Take 1 tablet (650 mg total) by mouth every 8 (eight) hours as needed for pain. 10/27/16  Yes Mayo, Pete Pelt, MD  CHANTIX 0.5 MG tablet TAKE 1 TABLET BY MOUTH TWICE A DAY 06/01/17  Yes Verner Mould, MD  Cholecalciferol  (VITAMIN D3) 2000 UNITS capsule Take 2,000 Units by mouth daily.   Yes [provider]  clonazePAM (KLONOPIN) 1 MG tablet Take 0.5-1 tablets (0.5-1 mg total) by mouth 2 (two) times daily as needed for anxiety. 04/23/17  Yes Verner Mould, MD  cycloSPORINE (RESTASIS) 0.05 % ophthalmic emulsion Place 1 drop into both eyes 2 (two) times daily. 04/28/16  Yes Rogue Bussing, MD  docusate sodium (COLACE) 100 MG capsule Take 1 capsule (100 mg total) by mouth 2 (two) times daily. 01/08/17  Yes Gatha Mayer, MD  escitalopram (LEXAPRO) 20 MG tablet Take 1 tablet (20 mg total) by mouth daily. Stop the 10mg  tablets. Dose increased to 20mg  today 01/22/17  Yes Merian Capron, MD  fluticasone (FLONASE) 50 MCG/ACT nasal spray Place 2 sprays into both nostrils daily. 04/28/16  Yes Rogue Bussing, MD  furosemide (LASIX) 40 MG tablet Take 2 tablets (80 mg total) by mouth daily. 05/20/17  Yes Gatha Mayer, MD  gabapentin (NEURONTIN) 300 MG capsule Take 1 capsule (300 mg total) by mouth 3 (three) times daily. 12/16/16  Yes Mikell, Jeani Sow, MD  hydrOXYzine (ATARAX/VISTARIL) 25 MG tablet TAKE 1 TO 2 TABLETS BY MOUTH TWICE A DAY AS NEEDED FOR ITCHING 06/01/17  Yes Verner Mould, MD  lactulose [facility]) 10 GM/15ML solution Take 2 tbsp by mouth 2 times a day 04/28/16  Yes Gatha Mayer, MD  levothyroxine (SYNTHROID, LEVOTHROID) 200 MCG tablet Take 1 tablet (200 mcg total) by mouth daily before breakfast. 06/22/16  Yes Verner Mould, MD  loratadine (CLARITIN) 10 MG tablet TAKE 1 TABLET BY MOUTH EVERY DAY 04/20/17  Yes Verner Mould, MD  montelukast (SINGULAIR) 10 MG tablet TAKE 1 TABLET (10 MG TOTAL) BY MOUTH DAILY. 04/09/17  Yes Kozlow, Donnamarie Poag, MD  Multiple Vitamins-Minerals (CENTRUM SILVER ULTRA WOMENS PO) Take by mouth.   Yes [provider]  omeprazole (PRILOSEC) 20 MG capsule TAKE 1 CAPSULE (20 MG TOTAL) BY MOUTH DAILY. 12/08/16  Yes  Verner Mould, MD  propranolol (INDERAL) 60 MG tablet TAKE 1 TABLET (60 MG TOTAL) BY MOUTH 2 (TWO) TIMES DAILY. 04/12/17  Yes Verner Mould, MD  QUEtiapine (SEROQUEL) 25 MG tablet Take 1 tablet (25 mg total) by mouth at bedtime. 01/22/17  Yes Merian Capron, MD  ranitidine (ZANTAC) 300 MG tablet TAKE 1 TABLET (300 MG TOTAL) BY MOUTH AT BEDTIME. 02/16/17  Yes Gatha Mayer, MD  rifaximin (XIFAXAN) 550 MG TABS tablet Take 1 tablet (550 mg total) by mouth 2 (two) times daily. 06/18/15  Yes Gatha Mayer, MD  spironolactone (ALDACTONE) 100 MG tablet Take 2 tablets (200 mg total) by mouth daily. 06/18/17  Yes Gatha Mayer, MD  traMADol (ULTRAM) 50 MG tablet Take 1 tablet (50 mg total) by mouth 2 (two)  times daily. 06/10/17  Yes Verner Mould, MD  traZODone (DESYREL) 50 MG tablet TAKE 0.5 TABLETS (25 MG TOTAL) BY MOUTH AT BEDTIME AS NEEDED FOR SLEEP. 06/07/17  Yes Verner Mould, MD  bisacodyl ([employer] GENTLE LAXATIVE) 5 MG EC tablet TAKE 1 TABLET (5 MG TOTAL) BY MOUTH DAILY. Patient not taking: Reported on 06/26/2017 10/26/14   Olin Hauser, DO    Physical Exam: BP (!) 105/59   Pulse 73   Temp 98.5 F (36.9 C) (Oral)   Resp 12   SpO2 98%   General: Confused, oriented to self only Eyes: PERRL ENT: unremarkable Neck: supple, no JVD Cardiovascular: RRR Respiratory: CTABL Abdomen: protuberance, soft/NT, positive bowel sounds Skin: no rash Musculoskeletal:  No edema Psychiatric: calm/cooperative Neurologic: Confused, moving all extremities          Labs on Admission:  Basic Metabolic Panel:  Recent Labs Lab 06/26/17 1243  NA 129*  K 3.9  CL 100*  CO2 21*  GLUCOSE 167*  BUN 19  CREATININE 1.17*  CALCIUM 8.7*   Liver Function Tests:  Recent Labs Lab 06/26/17 1243  AST 59*  ALT 30  ALKPHOS 136*  BILITOT 2.5*  PROT 7.8  ALBUMIN 2.5*    Recent Labs Lab 06/26/17 1243  LIPASE 31    Recent Labs Lab  06/26/17 1243  AMMONIA 70*   CBC:  Recent Labs Lab 06/26/17 1243  WBC 7.0  NEUTROABS 4.1  HGB 10.3*  HCT 29.8*  MCV 113.3*  PLT 193   Cardiac Enzymes: No results for input(s): CKTOTAL, CKMB, CKMBINDEX, TROPONINI in the last 168 hours.  BNP (last 3 results) No results for input(s): BNP in the last 8760 hours.  ProBNP (last 3 results) No results for input(s): PROBNP in the last 8760 hours.  CBG: No results for input(s): GLUCAP in the last 168 hours.  Radiological Exams on Admission: Dg Chest 2 View  Result Date: 06/26/2017 CLINICAL DATA:  57 year old female with a history of altered mental status EXAM: CHEST  2 VIEW COMPARISON:  09/19/2013 FINDINGS: Cardiomediastinal silhouette unchanged in size and contour. No evidence of central vascular congestion. No pneumothorax. No pleural effusion. Lateral view demonstrates opacity at the posterior lung base. IMPRESSION: Low lung volumes with ill-defined opacity at the posterior lung base on the lateral view, potentially atelectasis or consolidation. Electronically Signed   By: Corrie Mckusick D.O.   On: 06/26/2017 13:57   Ct Head Wo Contrast  Result Date: 06/26/2017 CLINICAL DATA:  Altered mental status. EXAM: CT HEAD WITHOUT CONTRAST TECHNIQUE: Contiguous axial images were obtained from the base of the skull through the vertex without intravenous contrast. COMPARISON:  Brain MR dated 08/29/2014 and head CT dated 01/19/2005. FINDINGS: Brain: Stable right frontal encephalomalacia and small amount of left posterior parietal encephalomalacia. Diffusely enlarged ventricles and subarachnoid spaces. Patchy white matter low density in both cerebral hemispheres. No intracranial hemorrhage, mass lesion or CT evidence of acute infarction. Vascular: No hyperdense vessel or unexpected calcification. Skull: Stable left parietooccipital post craniotomy changes. Sinuses/Orbits: Unremarkable. Other: None. IMPRESSION: 1. No acute abnormality. 2. Stable right  frontal and left posterior parietal encephalomalacia. 3. Diffuse cerebral and cerebellar atrophy and chronic small vessel white matter ischemic changes with little change since 08/29/2014. 4. Electronically Signed   By: Claudie Revering M.D.   On: 06/26/2017 13:58   US Abdomen Limited  Result Date: 06/26/2017 CLINICAL DATA:  Evaluate for ascites.  Abdominal distension. EXAM: LIMITED ABDOMEN ULTRASOUND FOR ASCITES TECHNIQUE: Limited ultrasound survey for  ascites was performed in all four abdominal quadrants. COMPARISON:  Ultrasound abdomen 01/22/2017. FINDINGS: Trace amount of fluid within the abdomen. No significant pocket of fluid identified within the quadrants. IMPRESSION: Minimal ascites. Electronically Signed   By: Lovey Newcomer M.D.   On: 06/26/2017 16:11    EKG: Independently reviewed.  Sinus rhythm, no acute ST-T changes, QTC is 492  Assessment/Plan Present on Admission: **None**  Hepatic encephalopathy Hold sedative medication Increase lactulose, continue rifaximin  Hyponatremia: From cirrhosis?  Continue lasix/spironolactone Repeat labs in am  Cirrhosis with ascites, secondary to hepatitis C and prior alcohol use, esophageal varices, no prior history of variceal bleed -Per chart review, she  finished treatment for hepatitis C in 2016, she reports has stopped drinking alcohol -Per EDP abdominal ultrasound with minimal ascites - will start Rocephin empiric coverage for possible infection. she has no fever, no leukocytosis. She reported abdomen pain to the ED physician, however denies abdomen pain to me and abdomen is soft when I examine her. she does has borderline blood pressure and altered mental status -Blood culture obtained after EDP given doxycycline to cover possible cellulitis on abdomen -Continue home dose Lasix spironolactone, decrease propranolol  CKD 3, UA unremarkable, renal function at baseline, renal dosing medication   DVT prophylaxis: scd  Consultants:  none  Code Status: full   Family Communication:  Patient   Disposition Plan: admit to med tele  Time spent: 58mins  Irvin Bastin MD, PhD [facility] Pager [phone] If 7PM-7AM, please contact night-coverage at [email or web address], password [facility]

## 2017-06-26 NOTE — ED Provider Notes (Signed)
[facility] DEPT Provider Note   CSN: [id number] Arrival date & time: 06/26/17  1208     History   Chief Complaint Chief Complaint  Patient presents with  . Altered Mental Status    HPI Wendy [name] is a 56 y.o. female with PMH/o Dementia, Esophageal varices, Cirrhosis, hepatic encephalopathy BIB EMS who presents with AMS. Per dad, he was contacted by the person who rents a room from patient's house this morning stating that she was not acting appropriately. Additionally, the rent or told dad that patient has not taken her medications in the last 3 days. He said the last time he talked to her was broccoli 5 days ago and she was at her normal baseline. Dad is unsure of when her last normal was.  EM CAVEAT: DUE TO PATIENT"S AMS  The history is provided by a relative.    Past Medical History:  Diagnosis Date  . Allergic rhinitis   . Anxiety and depression   . Arthritis   . Blood transfusion   . Cataract    MILD  . Clotting disorder ([place])    prolonged clotting time due to liver disease  . Compression fracture 09/21/2013  . 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 Esophageal stricture    esophageal dysmotility and chronic dysphagia as well  . Esophageal varices ([place])   . GERD (gastroesophageal reflux disease)   . Hepatic cirrhosis due to chronic hepatitis C infection ([place])    ETOH causative as well  . Hepatic encephalopathy syndrome ([place])   . Hiatal hernia   . History of alcohol abuse   . History of hip fracture   . HTN (hypertension)   . Osteoporosis   . 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 Paraesophageal hernia   . Paraesophageal hiatal hernia 10/17/2015  . 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 Ulcer    2007    Patient Active Problem List   Diagnosis Date Noted  . Tremor 01/20/2017  . Fluctuation of weight 08/27/2016  .  Medically complex patient 08/27/2016  . Paresthesia of lower extremity 07/23/2016  . Generalized weakness 06/18/2016  . Eye drainage 04/29/2016  . Nasal congestion 04/29/2016  . Routine health maintenance 04/29/2016  . Nocturia 02/27/2016  . Morbid obesity ([place]) 02/07/2016  . 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 Pre-diabetes 11/01/2015  . TBI (traumatic brain injury) ([place]) 10/25/2015  . OSA on CPAP 10/25/2015  . Paraesophageal hiatal hernia 10/17/2015  . Chronic low back pain without sciatica 06/28/2015  . OSA (obstructive sleep apnea) 06/12/2015  . Excessive daytime sleepiness 04/11/2015  . Episodic cluster headache, not intractable 04/11/2015  . Obesity, morbid ([place]) 04/11/2015  . Insomnia 12/27/2014  . Hypothyroidism 09/04/2014  . Cognitive and behavioral changes 07/17/2014  . History of traumatic brain injury 07/10/2014  . Recurrent falls 06/29/2014  . Osteopenia 04/04/2014  . Esophageal varices in cirrhosis - trace 03/19/2014  . Anemia 02/01/2014  . Chronic kidney disease, unspecified ([place]) 02/01/2014  . Chronic back pain 02/01/2014  . Stricture and stenosis of esophagus 01/26/2014  . Fall at home 01/24/2014  . Near syncope 01/12/2014  . Compression fracture 09/21/2013  . Atypical chest pain 07/26/2013  . Ascites 07/19/2013  . Dyspnea 02/03/2013  . Tobacco abuse 10/22/2012  . Allergic conjunctivitis 06/19/2012  . Increased frequency of urination 02/05/2012  . Thinning hair 12/25/2011  . Lumbar  spondylosis 12/24/2011  . Obesity (BMI 35.0-39.9 without comorbidity) 12/08/2011  . Right hip pain 12/01/2011  . Chronic hepatitis C - genotype 1A 10/06/2011  . GERD (gastroesophageal reflux disease) 10/06/2011  . Hepatic encephalopathy syndrome ([place]) 07/28/2011  . Back pain 04/07/2011  . 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 THROMBOCYTOPENIA 10/21/2006  . Depression, major, recurrent, moderate ([place]) 10/21/2006  .  HYPERTENSION, BENIGN SYSTEMIC 10/21/2006  . Hepatic cirrhosis ([place]) 10/21/2006  . Generalized pruritus 10/21/2006    Past Surgical History:  Procedure Laterality Date  . BURR HOLE FOR SUBDURAL HEMATOMA  2004  . COLONOSCOPY  08/2012   moderatel left colon tics  . ESOPHAGOGASTRODUODENOSCOPY (EGD) WITH PROPOFOL N/A 01/26/2014   Procedure: ESOPHAGOGASTRODUODENOSCOPY (EGD) WITH PROPOFOL;  Surgeon: Lafayette Dragon, MD;  Location: [facility] ENDOSCOPY;  Service: Endoscopy;  Laterality: N/A;  . KYPHOPLASTY Bilateral 09/21/2013   Procedure: T12 - L1 KYPHOPLASTY;  Surgeon: Melina Schools, MD;  Location: [facility];  Service: Orthopedics;  Laterality: Bilateral;  . NASAL SINUS SURGERY    . ORIF HIP FRACTURE  2010   right x2  . SPLENECTOMY     age 70  . UPPER GASTROINTESTINAL ENDOSCOPY  08/05/2007   esophageal ring, hiatal hernia, portal gastropathy  . UPPER GASTROINTESTINAL ENDOSCOPY  10/14/2011    OB History    No data available       Home Medications    Prior to Admission medications   Medication Sig Start Date End Date Taking? Authorizing Provider  acetaminophen (TYLENOL 8 HOUR) 650 MG CR tablet Take 1 tablet (650 mg total) by mouth every 8 (eight) hours as needed for pain. 10/27/16  Yes Mayo, Pete Pelt, MD  CHANTIX 0.5 MG tablet TAKE 1 TABLET BY MOUTH TWICE A DAY 06/01/17  Yes Verner Mould, MD  Cholecalciferol (VITAMIN D3) 2000 UNITS capsule Take 2,000 Units by mouth daily.   Yes [provider]  clonazePAM (KLONOPIN) 1 MG tablet Take 0.5-1 tablets (0.5-1 mg total) by mouth 2 (two) times daily as needed for anxiety. 04/23/17  Yes Verner Mould, MD  cycloSPORINE (RESTASIS) 0.05 % ophthalmic emulsion Place 1 drop into both eyes 2 (two) times daily. 04/28/16  Yes Rogue Bussing, MD  docusate sodium (COLACE) 100 MG capsule Take 1 capsule (100 mg total) by mouth 2 (two) times daily. 01/08/17  Yes Gatha Mayer, MD  escitalopram (LEXAPRO) 20 MG tablet Take 1 tablet (20 mg  total) by mouth daily. Stop the 10mg  tablets. Dose increased to 20mg  today 01/22/17  Yes Merian Capron, MD  fluticasone (FLONASE) 50 MCG/ACT nasal spray Place 2 sprays into both nostrils daily. 04/28/16  Yes Rogue Bussing, MD  furosemide (LASIX) 40 MG tablet Take 2 tablets (80 mg total) by mouth daily. 05/20/17  Yes Gatha Mayer, MD  gabapentin (NEURONTIN) 300 MG capsule Take 1 capsule (300 mg total) by mouth 3 (three) times daily. 12/16/16  Yes Mikell, Jeani Sow, MD  hydrOXYzine (ATARAX/VISTARIL) 25 MG tablet TAKE 1 TO 2 TABLETS BY MOUTH TWICE A DAY AS NEEDED FOR ITCHING 06/01/17  Yes Verner Mould, MD  lactulose [facility]) 10 GM/15ML solution Take 2 tbsp by mouth 2 times a day 04/28/16  Yes Gatha Mayer, MD  levothyroxine (SYNTHROID, LEVOTHROID) 200 MCG tablet Take 1 tablet (200 mcg total) by mouth daily before breakfast. 06/22/16  Yes Verner Mould, MD  loratadine (CLARITIN) 10 MG tablet TAKE 1 TABLET BY MOUTH EVERY DAY 04/20/17  Yes Verner Mould, MD  montelukast (  SINGULAIR) 10 MG tablet TAKE 1 TABLET (10 MG TOTAL) BY MOUTH DAILY. 04/09/17  Yes Kozlow, Donnamarie Poag, MD  Multiple Vitamins-Minerals (CENTRUM SILVER ULTRA WOMENS PO) Take by mouth.   Yes [provider]  omeprazole (PRILOSEC) 20 MG capsule TAKE 1 CAPSULE (20 MG TOTAL) BY MOUTH DAILY. 12/08/16  Yes Verner Mould, MD  propranolol (INDERAL) 60 MG tablet TAKE 1 TABLET (60 MG TOTAL) BY MOUTH 2 (TWO) TIMES DAILY. 04/12/17  Yes Verner Mould, MD  QUEtiapine (SEROQUEL) 25 MG tablet Take 1 tablet (25 mg total) by mouth at bedtime. 01/22/17  Yes Merian Capron, MD  ranitidine (ZANTAC) 300 MG tablet TAKE 1 TABLET (300 MG TOTAL) BY MOUTH AT BEDTIME. 02/16/17  Yes Gatha Mayer, MD  rifaximin (XIFAXAN) 550 MG TABS tablet Take 1 tablet (550 mg total) by mouth 2 (two) times daily. 06/18/15  Yes Gatha Mayer, MD  spironolactone (ALDACTONE) 100 MG tablet Take 2 tablets (200 mg  total) by mouth daily. 06/18/17  Yes Gatha Mayer, MD  traMADol (ULTRAM) 50 MG tablet Take 1 tablet (50 mg total) by mouth 2 (two) times daily. 06/10/17  Yes Verner Mould, MD  traZODone (DESYREL) 50 MG tablet TAKE 0.5 TABLETS (25 MG TOTAL) BY MOUTH AT BEDTIME AS NEEDED FOR SLEEP. 06/07/17  Yes Verner Mould, MD  bisacodyl ([employer] GENTLE LAXATIVE) 5 MG EC tablet TAKE 1 TABLET (5 MG TOTAL) BY MOUTH DAILY. Patient not taking: Reported on 06/26/2017 10/26/14   Olin Hauser, DO    Family History Family History  Problem Relation Age of Onset  . Heart disease Mother   . Anxiety disorder Mother   . Other Daughter        chromosome abnormalit  . Other Daughter        micropthalmia  . Cancer Maternal Grandmother   . Kidney failure Sister   . Colon cancer Neg Hx   . Esophageal cancer Neg Hx   . Rectal cancer Neg Hx   . Stomach cancer Neg Hx     Social History Social History  Substance Use Topics  . Smoking status: Current Every Day Smoker    Packs/day: 0.25    Years: 10.00    Types: Cigarettes  . Smokeless tobacco: Never Used     Comment: reduce # of cigarettes  . Alcohol use No     Comment: history of attending AA     Allergies   Codeine phosphate and Codeine   Review of Systems Review of Systems  Unable to perform ROS: Mental status change     Physical Exam Updated Vital Signs BP (!) 105/59   Pulse 73   Temp 98.5 F (36.9 C) (Oral)   Resp 12   SpO2 98%   Physical Exam  Constitutional: She appears well-developed and well-nourished.  Appears older than stated age  HENT:  Head: Normocephalic and atraumatic.  Mouth/Throat: Oropharynx is clear and moist and mucous membranes are normal.  Eyes: Pupils are equal, round, and reactive to light. Conjunctivae, EOM and lids are normal.  Neck: Normal range of motion and full passive range of motion without pain.  Cardiovascular: Normal rate, regular rhythm, normal heart sounds and normal  pulses.  Exam reveals no gallop and no friction rub.   No murmur heard. Pulses:      Radial pulses are 2+ on the right side, and 2+ on the left side.  Pulmonary/Chest: Effort normal and breath sounds normal.  Abdominal: Soft. Normal appearance and  bowel sounds are normal. She exhibits no distension. There is generalized tenderness. There is no rigidity, no guarding and no tenderness at McBurney's point.  Musculoskeletal: Normal range of motion.  Neurological: She is alert.  Moves all extremity spontaneously. 5/5 strength of BLE A&Ox1 - oriented to person but not place or time Will intermittently follow commands but then stops following and will not respond to commands but is still alert. Difficulty to assess neuro exam secondary to patient's inability to cooperate  Skin: Skin is warm and dry. Capillary refill takes less than 2 seconds.  Diffuse erythema overlying the anterior aspect of her abdomen. No overlying warmth, induration. No open wounds, ulcers, lacerations.  Psychiatric: She has a normal mood and affect. Her speech is normal.  Nursing note and vitals reviewed.    ED Treatments / Results  Labs (all labs ordered are listed, but only abnormal results are displayed) Labs Reviewed  COMPREHENSIVE METABOLIC PANEL - Abnormal; Notable for the following:       Result Value   Sodium 129 (*)    Chloride 100 (*)    CO2 21 (*)    Glucose, Bld 167 (*)    Creatinine, Ser 1.17 (*)    Calcium 8.7 (*)    Albumin 2.5 (*)    AST 59 (*)    Alkaline Phosphatase 136 (*)    Total Bilirubin 2.5 (*)    GFR calc non Af Amer 51 (*)    GFR calc Af Amer 59 (*)    All other components within normal limits  CBC WITH DIFFERENTIAL/PLATELET - Abnormal; Notable for the following:    RBC 2.63 (*)    Hemoglobin 10.3 (*)    HCT 29.8 (*)    MCV 113.3 (*)    MCH 39.2 (*)    RDW 16.0 (*)    Monocytes Absolute 1.6 (*)    All other components within normal limits  ACETAMINOPHEN LEVEL - Abnormal; Notable  for the following:    Acetaminophen (Tylenol), Serum <10 (*)    All other components within normal limits  URINALYSIS, ROUTINE W REFLEX MICROSCOPIC - Abnormal; Notable for the following:    Specific Gravity, Urine 1.002 (*)    All other components within normal limits  AMMONIA - Abnormal; Notable for the following:    Ammonia 70 (*)    All other components within normal limits  PROTIME-INR - Abnormal; Notable for the following:    Prothrombin Time 17.5 (*)    All other components within normal limits  ETHANOL  SALICYLATE LEVEL  RAPID URINE DRUG SCREEN, HOSP PERFORMED  LIPASE, BLOOD  I-STAT CG4 LACTIC ACID, ED  I-STAT CG4 LACTIC ACID, ED    EKG  EKG Interpretation None       Radiology Dg Chest 2 View  Result Date: 06/26/2017 CLINICAL DATA:  56 year old female with a history of altered mental status EXAM: CHEST  2 VIEW COMPARISON:  09/19/2013 FINDINGS: Cardiomediastinal silhouette unchanged in size and contour. No evidence of central vascular congestion. No pneumothorax. No pleural effusion. Lateral view demonstrates opacity at the posterior lung base. IMPRESSION: Low lung volumes with ill-defined opacity at the posterior lung base on the lateral view, potentially atelectasis or consolidation. Electronically Signed   By: Corrie Mckusick D.O.   On: 06/26/2017 13:57   Ct Head Wo Contrast  Result Date: 06/26/2017 CLINICAL DATA:  Altered mental status. EXAM: CT HEAD WITHOUT CONTRAST TECHNIQUE: Contiguous axial images were obtained from the base of the skull through the vertex without  intravenous contrast. COMPARISON:  Brain MR dated 08/29/2014 and head CT dated 01/19/2005. FINDINGS: Brain: Stable right frontal encephalomalacia and small amount of left posterior parietal encephalomalacia. Diffusely enlarged ventricles and subarachnoid spaces. Patchy white matter low density in both cerebral hemispheres. No intracranial hemorrhage, mass lesion or CT evidence of acute infarction. Vascular: No  hyperdense vessel or unexpected calcification. Skull: Stable left parietooccipital post craniotomy changes. Sinuses/Orbits: Unremarkable. Other: None. IMPRESSION: 1. No acute abnormality. 2. Stable right frontal and left posterior parietal encephalomalacia. 3. Diffuse cerebral and cerebellar atrophy and chronic small vessel white matter ischemic changes with little change since 08/29/2014. 4. Electronically Signed   By: Claudie Revering M.D.   On: 06/26/2017 13:58   US Abdomen Limited  Result Date: 06/26/2017 CLINICAL DATA:  Evaluate for ascites.  Abdominal distension. EXAM: LIMITED ABDOMEN ULTRASOUND FOR ASCITES TECHNIQUE: Limited ultrasound survey for ascites was performed in all four abdominal quadrants. COMPARISON:  Ultrasound abdomen 01/22/2017. FINDINGS: Trace amount of fluid within the abdomen. No significant pocket of fluid identified within the quadrants. IMPRESSION: Minimal ascites. Electronically Signed   By: Lovey Newcomer M.D.   On: 06/26/2017 16:11    Procedures Procedures (including critical care time)  Medications Ordered in ED Medications  doxycycline (VIBRAMYCIN) 100 mg in dextrose 5 % 250 mL IVPB (not administered)  lactulose (CHRONULAC) 10 GM/15ML solution 10 g (10 g Oral Given 06/26/17 1547)     Initial Impression / Assessment and Plan / ED Course  I have reviewed the triage vital signs and the nursing notes.  Pertinent labs & imaging results that were available during my care of the patient were reviewed by me and considered in my medical decision making (see chart for details).     56 year old female who presents with complaints of altered mental status. That is contacted by housemate who said that patient was not acting properly and stated that patient had not taken her medications the last 3 days. Patient is afebrile, non-toxic appearing, sitting comfortably on examination table. Vital signs reviewed. She is slightly hypertensive. Patient is alert and oriented to person only.  At bedside and states that this is not her baseline. Concern for infectious etiology versus vs encephalopathy vs drug etiology.  Suspect that symptoms may be secondary to noncompliance with medication, particularly lactulose.  Will check basic labs including CBC, CMP, lipase, UA, UDS, salicylate level, ethanol level, acetaminophen level, CT head, chest x-ray, ammonia level.   Labs and imaging reviewed. Urinalysis is negative for any acute UTI. Urine drug screen is negative for any drugs. Acetaminophen level normal. Salicylate level normal. Ethanol level normal. CBC shows anemia of 10.3 and 29.8. Records reviewed showed the patient has baseline history of anemia. CMP shows hyponatremia 129. EOM and creatinine are at baseline. Patient has elevation in indices/ALT and alkaline phosphatase. Records reviewed show this is baseline. She does have a slight bump in her total bili compared to previous labs. Ammonia level is elevated, likely secondary to subtherapeutic use of lactulose. CT head negative for any acute abnormality. CXR x-ray concerning for potential lower lung base pneumonia.  Given altered mental status, increased ammonia level and concern for possible pneumonia and cellulitis of the abdomen, will plan for admission for further evaluation.  We will plan to start patient on antibiotics in the department.  We will give a dose of lactulose here in the department.  Discussed patient with pharmacy regarding doxycycline as an antibiotic choice that covers both cellulitis and CAP. She reviewed the medication and there  is a rare instance of hepatotoxicity but feels like given patient's labs, this will be okay to start.  Discussed patient with Dr. Erlinda Hong (hospitalist). Given patient's history of ascites and history of needing paracentesis, recommends doing a diagnostic paracentesis in the emergency room. I discussed regarding patient's physical exam and concern that there is overlying cellulitis and that emergency  paracentesis would not be indicated in this instance. I also informed that the attending has evaluated the patient does not feel the patient needs a paracentesis at this time. Dr. Erlinda Hong would like a abdomen ultrasound for evaluation ascites before admission. She recommend stopping all antibiotics prior to paracentesis.   Discussed patient with Dr. Ralene Bathe who performed a bedside ultrasound that showed no obvious area of ascites that would indicate need for emergent paracentesis.   Abdomen ultrasound shows trace amount of fluid identified. No evident pocket for paracentesis.   Dr. Erlinda Hong will admit.   Final Clinical Impressions(s) / ED Diagnoses   Final diagnoses:  Altered mental status, unspecified altered mental status type  Altered mental status    New Prescriptions New Prescriptions   No medications on file     Desma Mcgregor 06/26/17 2113    Quintella Reichert, MD 06/28/17 [phone]

## 2017-06-26 NOTE — ED Notes (Signed)
ULTRASOUND AT BEDSIDE

## 2017-06-26 NOTE — ED Notes (Signed)
ED Provider at bedside. [place] PRESENT TO EVALUATE PT

## 2017-06-26 NOTE — ED Notes (Signed)
BLOOD CULTURE 2ND SET 5ML LEFT AC OBTAINED BY ROBERT B EMT

## 2017-06-26 NOTE — ED Triage Notes (Signed)
Patient here via EMS from home with complaints of altered mental status. Caregiver reports that patient has not been taking medications. Alert to self.

## 2017-06-26 NOTE — ED Notes (Signed)
EDPA Provider at bedside. 

## 2017-06-26 NOTE — ED Notes (Signed)
Please call Verdis Frederickson, RN at [phone] for report at [facility]. Thanks.

## 2017-06-26 NOTE — ED Notes (Signed)
BLOOD CULTURE X 1 5ML EACH RIGHT AC

## 2017-06-27 DIAGNOSIS — K7290 Hepatic failure, unspecified without coma: Principal | ICD-10-CM

## 2017-06-27 DIAGNOSIS — R4182 Altered mental status, unspecified: Secondary | ICD-10-CM

## 2017-06-27 DIAGNOSIS — R188 Other ascites: Secondary | ICD-10-CM

## 2017-06-27 DIAGNOSIS — R140 Abdominal distension (gaseous): Secondary | ICD-10-CM

## 2017-06-27 LAB — CBC WITH DIFFERENTIAL/PLATELET
BASOS ABS: 0.1 10*3/uL (ref 0.0–0.1)
Basophils Relative: 1 %
EOS ABS: 0.2 10*3/uL (ref 0.0–0.7)
Eosinophils Relative: 3 %
HEMATOCRIT: 27.1 % — AB (ref 36.0–46.0)
HEMOGLOBIN: 9.4 g/dL — AB (ref 12.0–15.0)
LYMPHS PCT: 18 %
Lymphs Abs: 1.3 10*3/uL (ref 0.7–4.0)
MCH: 39.7 pg — ABNORMAL HIGH (ref 26.0–34.0)
MCHC: 34.7 g/dL (ref 30.0–36.0)
MCV: 114.3 fL — ABNORMAL HIGH (ref 78.0–100.0)
MONOS PCT: 30 %
Monocytes Absolute: 2.2 10*3/uL — ABNORMAL HIGH (ref 0.1–1.0)
NEUTROS ABS: 3.6 10*3/uL (ref 1.7–7.7)
Neutrophils Relative %: 48 %
Platelets: 184 10*3/uL (ref 150–400)
RBC: 2.37 MIL/uL — AB (ref 3.87–5.11)
RDW: 16.6 % — AB (ref 11.5–15.5)
WBC: 7.4 10*3/uL (ref 4.0–10.5)

## 2017-06-27 LAB — BLOOD CULTURE ID PANEL (REFLEXED)
Acinetobacter baumannii: NOT DETECTED
CANDIDA GLABRATA: NOT DETECTED
Candida albicans: NOT DETECTED
Candida krusei: NOT DETECTED
Candida parapsilosis: NOT DETECTED
Candida tropicalis: NOT DETECTED
ENTEROBACTER CLOACAE COMPLEX: NOT DETECTED
ENTEROCOCCUS SPECIES: NOT DETECTED
Enterobacteriaceae species: NOT DETECTED
Escherichia coli: NOT DETECTED
Haemophilus influenzae: NOT DETECTED
Klebsiella oxytoca: NOT DETECTED
Klebsiella pneumoniae: NOT DETECTED
LISTERIA MONOCYTOGENES: NOT DETECTED
Methicillin resistance: NOT DETECTED
NEISSERIA MENINGITIDIS: NOT DETECTED
PROTEUS SPECIES: NOT DETECTED
Pseudomonas aeruginosa: NOT DETECTED
SERRATIA MARCESCENS: NOT DETECTED
STAPHYLOCOCCUS AUREUS BCID: NOT DETECTED
STAPHYLOCOCCUS SPECIES: DETECTED — AB
STREPTOCOCCUS AGALACTIAE: NOT DETECTED
STREPTOCOCCUS SPECIES: NOT DETECTED
Streptococcus pneumoniae: NOT DETECTED
Streptococcus pyogenes: NOT DETECTED

## 2017-06-27 LAB — COMPREHENSIVE METABOLIC PANEL
ALBUMIN: 2 g/dL — AB (ref 3.5–5.0)
ALK PHOS: 102 U/L (ref 38–126)
ALT: 26 U/L (ref 14–54)
ANION GAP: 7 (ref 5–15)
AST: 53 U/L — AB (ref 15–41)
BILIRUBIN TOTAL: 2 mg/dL — AB (ref 0.3–1.2)
BUN: 15 mg/dL (ref 6–20)
CO2: 22 mmol/L (ref 22–32)
Calcium: 8.3 mg/dL — ABNORMAL LOW (ref 8.9–10.3)
Chloride: 106 mmol/L (ref 101–111)
Creatinine, Ser: 1.08 mg/dL — ABNORMAL HIGH (ref 0.44–1.00)
GFR calc Af Amer: >60 mL/min (ref >60)
GFR calc non Af Amer: 56 mL/min — ABNORMAL LOW (ref >60)
GLUCOSE: 150 mg/dL — AB (ref 65–99)
POTASSIUM: 3.6 mmol/L (ref 3.5–5.1)
SODIUM: 135 mmol/L (ref 135–145)
Total Protein: 6.4 g/dL — ABNORMAL LOW (ref 6.5–8.1)

## 2017-06-27 LAB — LACTIC ACID, PLASMA: LACTIC ACID, VENOUS: 1.5 mmol/L (ref 0.5–1.9)

## 2017-06-27 LAB — HIV ANTIBODY (ROUTINE TESTING W REFLEX): HIV Screen 4th Generation wRfx: NONREACTIVE

## 2017-06-27 LAB — AMMONIA: Ammonia: 60 umol/L — ABNORMAL HIGH (ref 9–35)

## 2017-06-27 MED ORDER — LACTULOSE 10 GM/15ML PO SOLN
20.0000 g | Freq: Three times a day (TID) | ORAL | Status: DC
  Administered 2017-06-27: 20 g via ORAL
  Filled 2017-06-27: qty 30

## 2017-06-27 MED ORDER — LACTULOSE 10 GM/15ML PO SOLN
10.0000 g | Freq: Once | ORAL | Status: AC
  Administered 2017-06-27: 10 g via ORAL
  Filled 2017-06-27: qty 15

## 2017-06-27 NOTE — Progress Notes (Signed)
PHARMACY - PHYSICIAN COMMUNICATION CRITICAL VALUE ALERT - BLOOD CULTURE IDENTIFICATION (BCID)  Results for orders placed or performed during the hospital encounter of 06/26/17  Blood Culture ID Panel (Reflexed) (Collected: 06/26/2017  6:42 PM)  Result Value Ref Range   Enterococcus species NOT DETECTED NOT DETECTED   Listeria monocytogenes NOT DETECTED NOT DETECTED   Staphylococcus species DETECTED (A) NOT DETECTED   Staphylococcus aureus NOT DETECTED NOT DETECTED   Methicillin resistance NOT DETECTED NOT DETECTED   Streptococcus species NOT DETECTED NOT DETECTED   Streptococcus agalactiae NOT DETECTED NOT DETECTED   Streptococcus pneumoniae NOT DETECTED NOT DETECTED   Streptococcus pyogenes NOT DETECTED NOT DETECTED   Acinetobacter baumannii NOT DETECTED NOT DETECTED   Enterobacteriaceae species NOT DETECTED NOT DETECTED   Enterobacter cloacae complex NOT DETECTED NOT DETECTED   Escherichia coli NOT DETECTED NOT DETECTED   Klebsiella oxytoca NOT DETECTED NOT DETECTED   Klebsiella pneumoniae NOT DETECTED NOT DETECTED   Proteus species NOT DETECTED NOT DETECTED   Serratia marcescens NOT DETECTED NOT DETECTED   Haemophilus influenzae NOT DETECTED NOT DETECTED   Neisseria meningitidis NOT DETECTED NOT DETECTED   Pseudomonas aeruginosa NOT DETECTED NOT DETECTED   Candida albicans NOT DETECTED NOT DETECTED   Candida glabrata NOT DETECTED NOT DETECTED   Candida krusei NOT DETECTED NOT DETECTED   Candida parapsilosis NOT DETECTED NOT DETECTED   Candida tropicalis NOT DETECTED NOT DETECTED   Name of physician (or Provider) Contacted: A Kadrakandy  Changes to prescribed antibiotics required: None, likely contaminant Dorrene German 06/27/2017  9:45 PM

## 2017-06-27 NOTE — Progress Notes (Signed)
PROGRESS NOTE  Wendy [name]  GUR:[id number] DOB: 11-05-1960 DOA: 06/26/2017 PCP: Verner Mould, MD   Per HPI: Wendy [name] is a 56 y.o. female with history of cirrhosis with ascites esophageal varices secondary to hepatitis C and prior alcohol use, sent to the emergency room due to altered mental status.  ED course: Vital signs are stable, patient is very confused not able to provide detailed history.  Basilic work WBC 7, hemoglobin 10, platelet 193, sodium 129, potassium 3.9, BUN 19 ,creatinine 1.17, LFTs mildly elevated but close to baseline, ammonia elevated at 70.  EDP called to request admit the patient.  I have requested abdominal ultrasound and diagnostic paracentesis.  Per EDP not enough to tap.  She is given doxycycline in the ED due to concern of superficial cellulitis anterior abdomen.   Assessment & Plan: Active Problems:   Hepatic encephalopathy (HCC)  Hepatic encephalopathy: Ammonia elevated to 70.  - Still no BM since arrival, will increase dose and frequency of lactulose, continue rifaximin.  - Hold sedative medications  Hepatic cirrhosis: With minimal ascites (unable to find pocket for para in ED) secondary to hepatitis C and prior alcohol use. EMR indicates complete hep C Tx 2016, no recent EtOH. INR 1.44 and albumin low. HIV checked and nonreactive. - Continue empiric ceftriaxone, though low threshold to stop and monitor if paracentesis unable to be performed for diagnostic purposes. No leukocytosis, fever, lactate elevation, or tenderness on my exam. Of note, CTX would likely cover any cellulitis (considered by EDP) or typical PNA pathogens.  - Monitor blood cultures.  - Continue lasix/spironolactone  Esophageal varices: No history of bleeding.  - Monitor closely - Continue propranolol  Macrocytic anemia: Due to cirrhosis. - Follow CBC until definitely stable hgb - Check B12 (historically elevated in 2015-2017)  Stage III CKD: Cr at baseline  at 1.17 on arrival.  - Monitor - Avoid nephrotoxins as able.   Abnormal CXR: Ill-defined opacity, suspect poor inspiratory effort, but covering with CTX.  - Repeat CXR in AM  Hyponatremia: Resolved, likely due to cirrhosis  DVT prophylaxis: SCDs Code Status: Full Family Communication: None at bedside Disposition Plan: DC to home once encephalopathy improved  Consultants:   None  Procedures:   Paracentesis ordered  Antimicrobials:  Ceftriaxone   Subjective: Alert but confused. Has no complaints. Has not had BM since arrival.   Objective: BP (!) 104/59 (BP Location: Left Arm)   Pulse 72   Temp 98.1 F (36.7 C) (Oral)   Resp 20   Ht 5\' 2"  (1.575 m)   Wt 92.4 kg (203 lb 11.3 oz)   SpO2 100%   BMI 37.26 kg/m   Gen: Unkempt 56 y.o. female in no distress  Pulm: Non-labored breathing room air. Clear to auscultation bilaterally.  CV: Regular rate and rhythm. No murmur, rub, or gallop. No JVD, trace pedal edema. GI: Abdomen is soft and protuberant, very nontender, nondistended, no fluid wave +BS. Ext: Warm, no deformities Skin: No rashes, lesions, ulcers Neuro: Alert, oriented to person and "hospital" Doesn't know year or situation. No focal deficits. Psych: Judgement and insight impaired.  Data Reviewed: I have personally reviewed following labs and imaging studies  CBC: Recent Labs  Lab 06/26/17 1243 06/27/17 0632  WBC 7.0 7.4  NEUTROABS 4.1 3.6  HGB 10.3* 9.4*  HCT 29.8* 27.1*  MCV 113.3* 114.3*  PLT 193 283   Basic Metabolic Panel: Recent Labs  Lab 06/26/17 1243 06/27/17 0632  NA 129* 135  K  3.9 3.6  CL 100* 106  CO2 21* 22  GLUCOSE 167* 150*  BUN 19 15  CREATININE 1.17* 1.08*  CALCIUM 8.7* 8.3*   GFR: Estimated Creatinine Clearance: 60.8 mL/min (A) (by C-G formula based on SCr of 1.08 mg/dL (H)). Liver Function Tests: Recent Labs  Lab 06/26/17 1243 06/27/17 0632  AST 59* 53*  ALT 30 26  ALKPHOS 136* 102  BILITOT 2.5* 2.0*  PROT 7.8  6.4*  ALBUMIN 2.5* 2.0*   Recent Labs  Lab 06/26/17 1243  LIPASE 31   Recent Labs  Lab 06/26/17 1243 06/27/17 0632  AMMONIA 70* 60*   Coagulation Profile: Recent Labs  Lab 06/26/17 1640  INR 1.44   Cardiac Enzymes: No results for input(s): CKTOTAL, CKMB, CKMBINDEX, TROPONINI in the last 168 hours. BNP (last 3 results) No results for input(s): PROBNP in the last 8760 hours. HbA1C: No results for input(s): HGBA1C in the last 72 hours. CBG: No results for input(s): GLUCAP in the last 168 hours. Lipid Profile: No results for input(s): CHOL, HDL, LDLCALC, TRIG, CHOLHDL, LDLDIRECT in the last 72 hours. Thyroid Function Tests: No results for input(s): TSH, T4TOTAL, FREET4, T3FREE, THYROIDAB in the last 72 hours. Anemia Panel: No results for input(s): VITAMINB12, FOLATE, FERRITIN, TIBC, IRON, RETICCTPCT in the last 72 hours. Urine analysis:    Component Value Date/Time   COLORURINE YELLOW 06/26/2017 1300   APPEARANCEUR CLEAR 06/26/2017 1300   LABSPEC 1.002 (L) 06/26/2017 1300   PHURINE 6.0 06/26/2017 1300   GLUCOSEU NEGATIVE 06/26/2017 1300   HGBUR NEGATIVE 06/26/2017 1300   HGBUR trace-intact 02/10/2008 1504   BILIRUBINUR NEGATIVE 06/26/2017 1300   BILIRUBINUR small 05/04/2016 1441   KETONESUR NEGATIVE 06/26/2017 1300   PROTEINUR NEGATIVE 06/26/2017 1300   UROBILINOGEN 1.0 05/04/2016 1441   UROBILINOGEN 0.2 01/25/2014 0030   NITRITE NEGATIVE 06/26/2017 1300   LEUKOCYTESUR NEGATIVE 06/26/2017 1300   No results found for this or any previous visit (from the past 240 hour(s)).    Radiology Studies: Dg Chest 2 View  Result Date: 06/26/2017 CLINICAL DATA:  56 year old female with a history of altered mental status EXAM: CHEST  2 VIEW COMPARISON:  09/19/2013 FINDINGS: Cardiomediastinal silhouette unchanged in size and contour. No evidence of central vascular congestion. No pneumothorax. No pleural effusion. Lateral view demonstrates opacity at the posterior lung base.  IMPRESSION: Low lung volumes with ill-defined opacity at the posterior lung base on the lateral view, potentially atelectasis or consolidation. Electronically Signed   By: Corrie Mckusick D.O.   On: 06/26/2017 13:57   Ct Head Wo Contrast  Result Date: 06/26/2017 CLINICAL DATA:  Altered mental status. EXAM: CT HEAD WITHOUT CONTRAST TECHNIQUE: Contiguous axial images were obtained from the base of the skull through the vertex without intravenous contrast. COMPARISON:  Brain MR dated 08/29/2014 and head CT dated 01/19/2005. FINDINGS: Brain: Stable right frontal encephalomalacia and small amount of left posterior parietal encephalomalacia. Diffusely enlarged ventricles and subarachnoid spaces. Patchy white matter low density in both cerebral hemispheres. No intracranial hemorrhage, mass lesion or CT evidence of acute infarction. Vascular: No hyperdense vessel or unexpected calcification. Skull: Stable left parietooccipital post craniotomy changes. Sinuses/Orbits: Unremarkable. Other: None. IMPRESSION: 1. No acute abnormality. 2. Stable right frontal and left posterior parietal encephalomalacia. 3. Diffuse cerebral and cerebellar atrophy and chronic small vessel white matter ischemic changes with little change since 08/29/2014. 4. Electronically Signed   By: Claudie Revering M.D.   On: 06/26/2017 13:58   US Abdomen Limited  Result Date: 06/26/2017 CLINICAL  DATA:  Evaluate for ascites.  Abdominal distension. EXAM: LIMITED ABDOMEN ULTRASOUND FOR ASCITES TECHNIQUE: Limited ultrasound survey for ascites was performed in all four abdominal quadrants. COMPARISON:  Ultrasound abdomen 01/22/2017. FINDINGS: Trace amount of fluid within the abdomen. No significant pocket of fluid identified within the quadrants. IMPRESSION: Minimal ascites. Electronically Signed   By: Lovey Newcomer M.D.   On: 06/26/2017 16:11    Scheduled Meds: . cholecalciferol  2,000 Units Oral Daily  . cycloSPORINE  1 drop Both Eyes BID  . escitalopram   20 mg Oral Daily  . fluticasone  2 spray Each Nare Daily  . furosemide  80 mg Oral Daily  . lactulose  10 g Oral TID  . levothyroxine  200 mcg Oral QAC breakfast  . loratadine  10 mg Oral Daily  . montelukast  10 mg Oral QHS  . pantoprazole  40 mg Oral Daily  . propranolol  10 mg Oral BID  . rifaximin  550 mg Oral BID  . spironolactone  200 mg Oral Daily  . traMADol  50 mg Oral BID   Continuous Infusions: . cefTRIAXone (ROCEPHIN)  IV Stopped (06/26/17 2227)     LOS: 1 day   Time spent: 25 minutes.  Vance Gather, MD [facility] Pager [phone]  If 7PM-7AM, please contact night-coverage [email or web address] Password [facility] 06/27/2017, 5:24 PM

## 2017-06-28 ENCOUNTER — Inpatient Hospital Stay (HOSPITAL_COMMUNITY): Payer: Medicare Other

## 2017-06-28 ENCOUNTER — Encounter (HOSPITAL_COMMUNITY): Payer: Self-pay

## 2017-06-28 LAB — CBC
HCT: 32.1 % — ABNORMAL LOW (ref 36.0–46.0)
Hemoglobin: 11.2 g/dL — ABNORMAL LOW (ref 12.0–15.0)
MCH: 39.9 pg — AB (ref 26.0–34.0)
MCHC: 34.9 g/dL (ref 30.0–36.0)
MCV: 114.2 fL — AB (ref 78.0–100.0)
PLATELETS: 205 10*3/uL (ref 150–400)
RBC: 2.81 MIL/uL — ABNORMAL LOW (ref 3.87–5.11)
RDW: 16.3 % — AB (ref 11.5–15.5)
WBC: 18.4 10*3/uL — ABNORMAL HIGH (ref 4.0–10.5)

## 2017-06-28 LAB — COMPREHENSIVE METABOLIC PANEL
ALT: 31 U/L (ref 14–54)
AST: 57 U/L — AB (ref 15–41)
Albumin: 2.3 g/dL — ABNORMAL LOW (ref 3.5–5.0)
Alkaline Phosphatase: 123 U/L (ref 38–126)
Anion gap: 9 (ref 5–15)
BUN: 12 mg/dL (ref 6–20)
CHLORIDE: 105 mmol/L (ref 101–111)
CO2: 21 mmol/L — AB (ref 22–32)
CREATININE: 1.29 mg/dL — AB (ref 0.44–1.00)
Calcium: 8.6 mg/dL — ABNORMAL LOW (ref 8.9–10.3)
GFR calc Af Amer: 52 mL/min — ABNORMAL LOW (ref >60)
GFR, EST NON AFRICAN AMERICAN: 45 mL/min — AB (ref >60)
GLUCOSE: 146 mg/dL — AB (ref 65–99)
Potassium: 4.5 mmol/L (ref 3.5–5.1)
SODIUM: 135 mmol/L (ref 135–145)
Total Bilirubin: 1.5 mg/dL — ABNORMAL HIGH (ref 0.3–1.2)
Total Protein: 7.7 g/dL (ref 6.5–8.1)

## 2017-06-28 LAB — VITAMIN B12: Vitamin B-12: 4419 pg/mL — ABNORMAL HIGH (ref 180–914)

## 2017-06-28 MED ORDER — ONDANSETRON HCL 4 MG/2ML IJ SOLN
4.0000 mg | INTRAMUSCULAR | Status: DC | PRN
  Administered 2017-06-28: 4 mg via INTRAVENOUS
  Filled 2017-06-28: qty 2

## 2017-06-28 MED ORDER — LACTULOSE 10 GM/15ML PO SOLN
10.0000 g | Freq: Three times a day (TID) | ORAL | Status: DC
  Administered 2017-06-28 – 2017-06-29 (×4): 10 g via ORAL
  Filled 2017-06-28 (×4): qty 15

## 2017-06-28 MED ORDER — TRAMADOL HCL 50 MG PO TABS
50.0000 mg | ORAL_TABLET | Freq: Four times a day (QID) | ORAL | Status: DC | PRN
  Administered 2017-06-28 – 2017-06-29 (×2): 50 mg via ORAL
  Filled 2017-06-28 (×2): qty 1

## 2017-06-28 MED ORDER — ESCITALOPRAM OXALATE 10 MG PO TABS
10.0000 mg | ORAL_TABLET | Freq: Every day | ORAL | Status: DC
  Administered 2017-06-28 – 2017-06-29 (×2): 10 mg via ORAL
  Filled 2017-06-28 (×2): qty 1

## 2017-06-28 MED ORDER — AZITHROMYCIN 250 MG PO TABS
500.0000 mg | ORAL_TABLET | Freq: Every day | ORAL | Status: DC
  Administered 2017-06-28 – 2017-06-29 (×2): 500 mg via ORAL
  Filled 2017-06-28 (×2): qty 2

## 2017-06-28 NOTE — Progress Notes (Signed)
Patient has not voided yet this shift but has attempted x2 to [facility]. Pt has no urge to void at this time. Bladder scan showing 270 ml. Will have pt attempt to void again soon and notify MD if unable.

## 2017-06-28 NOTE — Care Management Note (Signed)
Case Management Note  Patient Details  Name: Wendy [name] MRN: [id number] Date of Birth: 1961-01-15  Subjective/Objective:  56 y/o f admitted w/hepatic encephalopathy. From home. PT cons-await recc.                  Action/Plan:d/c plan home.   Expected Discharge Date:               Expected Discharge Plan:  Home/Self Care  In-House Referral:     Discharge planning Services  CM Consult  Post Acute Care Choice:    Choice offered to:     DME Arranged:    DME Agency:     HH Arranged:    HH Agency:     Status of Service:  In process, will continue to follow  If discussed at Long Length of Stay Meetings, dates discussed:    Additional Comments:  Dessa Phi, RN 06/28/2017, 2:28 PM

## 2017-06-28 NOTE — Progress Notes (Signed)
PROGRESS NOTE  Wendy [name]  TDD:[id number] DOB: [birth year] DOA: 06/26/2017 PCP: Verner Mould, MD   Per HPI: Wendy [name] is a 56 y.o. female with history of cirrhosis with ascites esophageal varices secondary to hepatitis C and prior alcohol use, sent to the emergency room due to altered mental status.  ED course: Vital signs are stable, patient is very confused not able to provide detailed history.  Basilic work WBC 7, hemoglobin 10, platelet 193, sodium 129, potassium 3.9, BUN 19 ,creatinine 1.17, LFTs mildly elevated but close to baseline, ammonia elevated at 70.  EDP called to request admit the patient.  I have requested abdominal ultrasound and diagnostic paracentesis.  Per EDP not enough to tap.  She is given doxycycline in the ED due to concern of superficial cellulitis anterior abdomen.   Assessment & Plan: Active Problems:   Hepatic encephalopathy (HCC)  Hepatic encephalopathy: Ammonia elevated to 70. Remains disoriented, though unsure of her baseline. - Continue lactulose, will decrease dose back to 10mg  TID and monitor given blow-out 11/4. - Held sedative medications on admission, no evidence of benzodiazepine withdrawal.   Hepatic cirrhosis: With minimal ascites (unable to find pocket for para in ED) secondary to hepatitis C and prior alcohol use. EMR indicates complete hep C Tx 2016, no recent EtOH. INR 1.44 and albumin low. HIV checked and nonreactive. - No leukocytosis, fever, lactate elevation, or tenderness on my exam.  - Continue lasix/spironolactone - Would recommend stopping tylenol  Coagulase negative Staphylococcus in 1 of 4 blood cultures:  - Suspect this is a contaminant, will continue to monitor culture.   Esophageal varices: No history of bleeding.  - Monitor closely - Continue propranolol  Macrocytic anemia: Due to cirrhosis. B12 elevated. - Hgb stable.   Stage III CKD: Cr at baseline at 1.17 on arrival.  - Monitor - Avoid  nephrotoxins as able. NSAIDs not a good option for pain.  - Tramadol prn ordered with max dose 200mg /day.   LLL pneumonia: Subtle infiltrate seen on better quality CXR 11/5. WBC up to 18.4.   - Cover with ceftriaxone, add azithromycin.   Hyponatremia: Resolved, likely due to cirrhosis  Depression/anxiety:  - Holding low dose clonazepam for now.  - Decrease SSRI to 10mg  per pharmacy recommendation given hepatic impairment.  - Follow up with [facility]  Tremors: Diffuse, has been subacute/chronic problem referred to neurology as outpatient.  - Strongly recommend she follow up with neurology as an outpatient.   DVT prophylaxis: SCDs Code Status: Full Family Communication: None at bedside Disposition Plan: DC once encephalopathy improved, PT evaluation today.  Consultants:   None  Procedures:   Paracentesis not able to be performed  Antimicrobials:  Ceftriaxone 11/3 >>   Azithromycin 11/5 >>   Subjective: Pt slept well, has gotten up to the bathroom overnight with a large BM.   Objective: BP 119/69 (BP Location: Left Arm)   Pulse 82   Temp 98.6 F (37 C) (Oral)   Resp (!) 22   Ht 5\' 2"  (1.575 m)   Wt 92.4 kg (203 lb 11.3 oz)   SpO2 95%   BMI 37.26 kg/m   Gen: Unkempt 56 y.o. female in no distress HEENT: Poor dentition  Pulm: Non-labored breathing room air. Clear to auscultation bilaterally.  CV: Regular rate and rhythm. No murmur, rub, or gallop. No JVD, trace pedal edema. GI: Abdomen is soft and protuberant, nontender, nondistended, no fluid wave +BS. Ext: Warm, no deformities Skin: No rashes, lesions, ulcers. No  erythema on abdomen. No significant jaundice Neuro: Alert, oriented to person and [facility] hospital, states it's 1990 something, and doesn't know why she's here. No focal deficits. Mild diffuse tremor at rest but no asterixis. Psych: Judgement and insight impaired.  Data Reviewed: I have personally reviewed following labs and imaging studies  CBC: Recent Labs    Lab July 19, 2017 1243 06/27/17 0632 06/28/17 0529  WBC 7.0 7.4 18.4*  NEUTROABS 4.1 3.6  --   HGB 10.3* 9.4* 11.2*  HCT 29.8* 27.1* 32.1*  MCV 113.3* 114.3* 114.2*  PLT 193 184 671   Basic Metabolic Panel: Recent Labs  Lab Jul 19, 2017 1243 06/27/17 0632 06/28/17 0529  NA 129* 135 135  K 3.9 3.6 4.5  CL 100* 106 105  CO2 21* 22 21*  GLUCOSE 167* 150* 146*  BUN 19 15 12   CREATININE 1.17* 1.08* 1.29*  CALCIUM 8.7* 8.3* 8.6*   GFR: Estimated Creatinine Clearance: 50.9 mL/min (A) (by C-G formula based on SCr of 1.29 mg/dL (H)). Liver Function Tests: Recent Labs  Lab 07/19/2017 1243 06/27/17 0632 06/28/17 0529  AST 59* 53* 57*  ALT 30 26 31   ALKPHOS 136* 102 123  BILITOT 2.5* 2.0* 1.5*  PROT 7.8 6.4* 7.7  ALBUMIN 2.5* 2.0* 2.3*   Recent Labs  Lab 07/19/17 1243  LIPASE 31   Recent Labs  Lab 07/19/2017 1243 06/27/17 0632  AMMONIA 70* 60*   Coagulation Profile: Recent Labs  Lab 07/19/17 1640  INR 1.44   Anemia Panel: Recent Labs    06/28/17 0529  VITAMINB12 4,419*   Urine analysis:    Component Value Date/Time   COLORURINE YELLOW 19-Jul-2017 1300   APPEARANCEUR CLEAR 2017-07-19 1300   LABSPEC 1.002 (L) Jul 19, 2017 1300   PHURINE 6.0 2017-07-19 1300   GLUCOSEU NEGATIVE 07-19-17 1300   HGBUR NEGATIVE 2017/07/19 1300   HGBUR trace-intact 02/10/2008 1504   BILIRUBINUR NEGATIVE 07/19/17 1300   BILIRUBINUR small 05/04/2016 1441   KETONESUR NEGATIVE 07/19/2017 1300   PROTEINUR NEGATIVE 07/19/2017 1300   UROBILINOGEN 1.0 05/04/2016 1441   UROBILINOGEN 0.2 01/25/2014 0030   NITRITE NEGATIVE 2017-07-19 1300   LEUKOCYTESUR NEGATIVE 19-Jul-2017 1300   Recent Results (from the past 240 hour(s))  Culture, blood (routine x 2)     Status: None (Preliminary result)   Collection Time: July 19, 2017  6:37 PM  Result Value Ref Range Status   Specimen Description BLOOD RIGHT ANTECUBITAL  Final   Special Requests   Final    BOTTLES DRAWN AEROBIC AND ANAEROBIC Blood  Culture adequate volume   Culture   Final    NO GROWTH 1 DAY Performed at [facility], [place] [address], [place], [place] [address]    Report Status PENDING  Incomplete  Culture, blood (routine x 2)     Status: Abnormal (Preliminary result)   Collection Time: 2017/07/19  6:42 PM  Result Value Ref Range Status   Specimen Description BLOOD LEFT ANTECUBITAL  Final   Special Requests   Final    BOTTLES DRAWN AEROBIC AND ANAEROBIC Blood Culture adequate volume   Culture  Setup Time   Final    GRAM POSITIVE COCCI IN CLUSTERS ANAEROBIC BOTTLE ONLY CRITICAL RESULT CALLED TO, READ BACK BY AND VERIFIED WITH: B.GREEN,PHARMD AT 2141 ON 06/27/17 BY G.[email or web address]    Culture (A)  Final    STAPHYLOCOCCUS SPECIES (COAGULASE NEGATIVE) THE SIGNIFICANCE OF ISOLATING THIS ORGANISM FROM A SINGLE SET OF BLOOD CULTURES WHEN MULTIPLE SETS ARE DRAWN IS UNCERTAIN. PLEASE NOTIFY [place]  WITHIN ONE WEEK IF SPECIATION AND SENSITIVITIES ARE REQUIRED. Performed at [facility], [place] [address] [place], [place] [address]    Report Status PENDING  Incomplete  Blood Culture ID Panel (Reflexed)     Status: Abnormal   Collection Time: 06/26/17  6:42 PM  Result Value Ref Range Status   Enterococcus species NOT DETECTED NOT DETECTED Final   Listeria monocytogenes NOT DETECTED NOT DETECTED Final   Staphylococcus species DETECTED (A) NOT DETECTED Final    Comment: Methicillin (oxacillin) susceptible coagulase negative staphylococcus. Possible blood culture contaminant (unless isolated from more than one blood culture draw or clinical case suggests pathogenicity). No antibiotic treatment is indicated for blood  culture contaminants. CRITICAL RESULT CALLED TO, READ BACK BY AND VERIFIED WITH: B.GREEN,PHARMD AT 2141 ON 06/27/17 BY G.[email or web address]    Staphylococcus aureus NOT DETECTED NOT DETECTED Final   Methicillin resistance NOT DETECTED NOT DETECTED Final   Streptococcus species NOT DETECTED NOT DETECTED  Final   Streptococcus agalactiae NOT DETECTED NOT DETECTED Final   Streptococcus pneumoniae NOT DETECTED NOT DETECTED Final   Streptococcus pyogenes NOT DETECTED NOT DETECTED Final   Acinetobacter baumannii NOT DETECTED NOT DETECTED Final   Enterobacteriaceae species NOT DETECTED NOT DETECTED Final   Enterobacter cloacae complex NOT DETECTED NOT DETECTED Final   Escherichia coli NOT DETECTED NOT DETECTED Final   Klebsiella oxytoca NOT DETECTED NOT DETECTED Final   Klebsiella pneumoniae NOT DETECTED NOT DETECTED Final   Proteus species NOT DETECTED NOT DETECTED Final   Serratia marcescens NOT DETECTED NOT DETECTED Final   Haemophilus influenzae NOT DETECTED NOT DETECTED Final   Neisseria meningitidis NOT DETECTED NOT DETECTED Final   Pseudomonas aeruginosa NOT DETECTED NOT DETECTED Final   Candida albicans NOT DETECTED NOT DETECTED Final   Candida glabrata NOT DETECTED NOT DETECTED Final   Candida krusei NOT DETECTED NOT DETECTED Final   Candida parapsilosis NOT DETECTED NOT DETECTED Final   Candida tropicalis NOT DETECTED NOT DETECTED Final    Comment: Performed at [facility], [address] [place], [place] [address]      Radiology Studies: Dg Chest 2 View  Result Date: 06/28/2017 CLINICAL DATA:  Pneumonia EXAM: CHEST  2 VIEW COMPARISON:  June 26, 2017 FINDINGS: There is focal opacity in the posterior left base seen only on the lateral view. Lungs elsewhere appear clear. Heart size and pulmonary vascularity are normal. No adenopathy. There is a hiatal hernia. No evident bone lesions. 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 IMPRESSION: Small area of infiltrate posterior left base seen only on the lateral view. Lungs elsewhere clear. Heart size within normal limits.  No adenopathy. Hiatal hernia present. Electronically Signed   By: Lowella Grip III M.D.   On: 06/28/2017 10:39   Dg Chest 2 View  Result Date: 06/26/2017 CLINICAL DATA:  56 year old female with a history  of altered mental status EXAM: CHEST  2 VIEW COMPARISON:  09/19/2013 FINDINGS: Cardiomediastinal silhouette unchanged in size and contour. No evidence of central vascular congestion. No pneumothorax. No pleural effusion. Lateral view demonstrates opacity at the posterior lung base. IMPRESSION: Low lung volumes with ill-defined opacity at the posterior lung base on the lateral view, potentially atelectasis or consolidation. Electronically Signed   By: Corrie Mckusick D.O.   On: 06/26/2017 13:57   Ct Head Wo Contrast  Result Date: 06/26/2017 CLINICAL DATA:  Altered mental status. EXAM: CT HEAD WITHOUT CONTRAST TECHNIQUE: Contiguous axial images were obtained from the base of the skull through the  vertex without intravenous contrast. COMPARISON:  Brain MR dated 08/29/2014 and head CT dated 01/19/2005. FINDINGS: Brain: Stable right frontal encephalomalacia and small amount of left posterior parietal encephalomalacia. Diffusely enlarged ventricles and subarachnoid spaces. Patchy white matter low density in both cerebral hemispheres. No intracranial hemorrhage, mass lesion or CT evidence of acute infarction. Vascular: No hyperdense vessel or unexpected calcification. Skull: Stable left parietooccipital post craniotomy changes. Sinuses/Orbits: Unremarkable. Other: None. IMPRESSION: 1. No acute abnormality. 2. Stable right frontal and left posterior parietal encephalomalacia. 3. Diffuse cerebral and cerebellar atrophy and chronic small vessel white matter ischemic changes with little change since 08/29/2014. 4. Electronically Signed   By: Claudie Revering M.D.   On: 06/26/2017 13:58   US Abdomen Limited  Result Date: 06/26/2017 CLINICAL DATA:  Evaluate for ascites.  Abdominal distension. EXAM: LIMITED ABDOMEN ULTRASOUND FOR ASCITES TECHNIQUE: Limited ultrasound survey for ascites was performed in all four abdominal quadrants. COMPARISON:  Ultrasound abdomen 01/22/2017. FINDINGS: Trace amount of fluid within the abdomen.  No significant pocket of fluid identified within the quadrants. IMPRESSION: Minimal ascites. Electronically Signed   By: Lovey Newcomer M.D.   On: 06/26/2017 16:11    Scheduled Meds: . cholecalciferol  2,000 Units Oral Daily  . cycloSPORINE  1 drop Both Eyes BID  . escitalopram  10 mg Oral Daily  . fluticasone  2 spray Each Nare Daily  . furosemide  80 mg Oral Daily  . lactulose  10 g Oral TID  . levothyroxine  200 mcg Oral QAC breakfast  . loratadine  10 mg Oral Daily  . montelukast  10 mg Oral QHS  . pantoprazole  40 mg Oral Daily  . propranolol  10 mg Oral BID  . rifaximin  550 mg Oral BID  . spironolactone  200 mg Oral Daily  . traMADol  50 mg Oral BID   Continuous Infusions: . cefTRIAXone (ROCEPHIN)  IV Stopped (06/27/17 2247)     LOS: 2 days   Time spent: 25 minutes.  Vance Gather, MD [facility] Pager [phone]  If 7PM-7AM, please contact night-coverage [email or web address] Password [email or web address] 06/28/2017, 1:23 PM

## 2017-06-28 NOTE — Plan of Care (Signed)
progressing 

## 2017-06-29 ENCOUNTER — Other Ambulatory Visit: Payer: Self-pay

## 2017-06-29 DIAGNOSIS — F331 Major depressive disorder, recurrent, moderate: Secondary | ICD-10-CM

## 2017-06-29 DIAGNOSIS — J181 Lobar pneumonia, unspecified organism: Secondary | ICD-10-CM

## 2017-06-29 LAB — CBC
HCT: 29.2 % — ABNORMAL LOW (ref 36.0–46.0)
Hemoglobin: 10.1 g/dL — ABNORMAL LOW (ref 12.0–15.0)
MCH: 39.8 pg — ABNORMAL HIGH (ref 26.0–34.0)
MCHC: 34.6 g/dL (ref 30.0–36.0)
MCV: 115 fL — ABNORMAL HIGH (ref 78.0–100.0)
Platelets: 197 10*3/uL (ref 150–400)
RBC: 2.54 MIL/uL — ABNORMAL LOW (ref 3.87–5.11)
RDW: 16.3 % — AB (ref 11.5–15.5)
WBC: 12.1 10*3/uL — ABNORMAL HIGH (ref 4.0–10.5)

## 2017-06-29 LAB — CULTURE, BLOOD (ROUTINE X 2): Special Requests: ADEQUATE

## 2017-06-29 MED ORDER — ESCITALOPRAM OXALATE 20 MG PO TABS: 10.0000 mg | ORAL_TABLET | Freq: Every day | ORAL | 3 refills | Status: AC

## 2017-06-29 MED ORDER — LACTULOSE 10 GM/15ML PO SOLN: 20.0000 g | Freq: Three times a day (TID) | ORAL | 6 refills | Status: DC

## 2017-06-29 MED ORDER — CEFDINIR 300 MG PO CAPS: 300.0000 mg | ORAL_CAPSULE | Freq: Two times a day (BID) | ORAL | 0 refills | Status: DC

## 2017-06-29 MED ORDER — AZITHROMYCIN 250 MG PO TABS: 250.0000 mg | ORAL_TABLET | Freq: Every day | ORAL | 0 refills | Status: DC

## 2017-06-29 NOTE — Care Management Important Message (Signed)
Important Message  Patient Details  Name: Wendy [name] MRN: [id number] Date of Birth: April 22, 1961   Medicare Important Message Given:  Yes    Kerin Salen 06/29/2017, [place] Message  Patient Details  Name: Wendy [name] MRN: [id number] Date of Birth: Jun 28, 1961   Medicare Important Message Given:  Yes    Kerin Salen 06/29/2017, 11:21 AM

## 2017-06-29 NOTE — Evaluation (Signed)
Physical Therapy Evaluation Patient Details Name: Wendy [name] MRN: [id number] DOB: 1961-03-28 Today's Date: 06/29/2017   History of Present Illness  57 y.o. female with history of cirrhosis with ascites esophageal varices secondary to hepatitis C and prior alcohol use, sent to the emergency room due to altered mental status.  Dx of hepatic encephalopathy. 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  Clinical Impression  Pt ambulated 150' with RW with no loss of balance. She was able to independently perform bed mobility and transfers. Of concern is her report of at least 10 falls in the past year. She is not able to pinpoint any cause for the falls. HHPT for balance training recommended.     Follow Up Recommendations Home health PT(for balance training due to h/o multiple falls)    Equipment Recommendations  None recommended by PT    Recommendations for Other Services       Precautions / Restrictions Precautions Precautions: Fall Precaution Comments: pt reports h/o ~10 falls in past 1 year (MD note from 03/23/17 office visit stated pt had 6-8 falls that month alone, neuro consult was recommended, no neuro notes in chart review) Restrictions Weight Bearing Restrictions: No      Mobility  Bed Mobility Overal bed mobility: Modified Independent             General bed mobility comments: HOB up  Transfers Overall transfer level: Modified independent Equipment used: Rolling walker (2 wheeled)                Ambulation/Gait Ambulation/Gait assistance: Supervision Ambulation Distance (Feet): 150 Feet Assistive device: Rolling walker (2 wheeled) Gait Pattern/deviations: WFL(Within Functional Limits)   Gait velocity interpretation: Below normal speed for age/gender General Gait Details: steady, no LOB  Stairs            Wheelchair Mobility    Modified Rankin (Stroke Patients Only)       Balance Overall balance assessment: History of  Falls;Modified Independent                                           Pertinent Vitals/Pain Pain Assessment: No/denies pain    Home Living Family/patient expects to be discharged to:: Private residence Living Arrangements: Non-relatives/Friends(housemate) Available Help at Discharge: Available PRN/intermittently Type of Home: House Home Access: Level entry     Home Layout: One level Home Equipment: Environmental consultant - 2 wheels;Cane - single point;Bedside commode      Prior Function Level of Independence: Independent with assistive device(s)         Comments: uses RW, likes to take bath but has a hard time getting up from tub, shower currently not working so has been sponge bathing; doesn't drive, friend takes her to get groceries     Hand Dominance        Extremity/Trunk Assessment   Upper Extremity Assessment Upper Extremity Assessment: Defer to OT evaluation    Lower Extremity Assessment Lower Extremity Assessment: Overall WFL for tasks assessed    Cervical / Trunk Assessment Cervical / Trunk Assessment: Normal  Communication   Communication: No difficulties  Cognition Arousal/Alertness: Awake/alert Behavior During Therapy: WFL for tasks assessed/performed Overall Cognitive Status: No family/caregiver present to determine baseline cognitive functioning(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, not able to recall when this happened)  General Comments      Exercises     Assessment/Plan    PT Assessment All further PT needs can be met in the next venue of care  PT Problem List Decreased balance       PT Treatment Interventions      PT Goals (Current goals can be found in the Care Plan section)  Acute Rehab PT Goals PT Goal Formulation: All assessment and education complete, DC therapy    Frequency     Barriers to discharge        Co-evaluation               AM-PAC PT "6 Clicks" Daily Activity   Outcome Measure Difficulty turning over in bed (including adjusting bedclothes, sheets and blankets)?: None Difficulty moving from lying on back to sitting on the side of the bed? : A Little Difficulty sitting down on and standing up from a chair with arms (e.g., wheelchair, bedside commode, etc,.)?: None Help needed moving to and from a bed to chair (including a wheelchair)?: None Help needed walking in hospital room?: None Help needed climbing 3-5 steps with a railing? : A Little 6 Click Score: 22    End of Session Equipment Utilized During Treatment: Gait belt Activity Tolerance: Patient tolerated treatment well Patient left: in chair;with call bell/phone within reach;with chair alarm set Nurse Communication: Mobility status PT Visit Diagnosis: Repeated falls (R29.6)    Time: 6116-4353 PT Time Calculation (min) (ACUTE ONLY): 28 min   Charges:   PT Evaluation $PT Eval Low Complexity: 1 Low PT Treatments $Gait Training: 8-22 mins   PT G Codes:         Philomena Doheny 06/29/2017, 11:46 AM [phone]

## 2017-06-29 NOTE — Care Management Note (Signed)
Case Management Note  Patient Details  Name: Wendy [name] MRN: [id number] Date of Birth: [birth year]  Subjective/Objective: PT recc HHPT. Provided patient w/HHC agency list for choice-used [facility] in past-chose [facility] aware of HHPT, & d/c today. Patient has rw.No further CM needs.                   Action/Plan:d/c home w/HHPT.   Expected Discharge Date:  06/28/17               Expected Discharge Plan:  [place]  In-House Referral:     Discharge planning Services  CM Consult  Post Acute Care Choice:    Choice offered to:  Patient  DME Arranged:    DME Agency:     HH Arranged:  PT [place]:  [facility]  Status of Service:  Completed, signed off  If discussed at [place] of Stay Meetings, dates discussed:    Additional Comments:  Dessa Phi, RN 06/29/2017, 11:37 AM

## 2017-06-29 NOTE — Discharge Summary (Signed)
Physician Discharge Summary  Wendy [name] ATF:[id number] DOB: 05-Apr-1961 DOA: 06/26/2017  PCP: Verner Mould, MD  Admit date: 06/26/2017 Discharge date: 06/29/2017  Admitted From: Home Disposition: Home   Recommendations for Outpatient Follow-up:  1. Follow up with PCP in 1 week as scheduled.  2. Follow up mental status, admitted for hepatic encephalopathy: Encouraged to continue rifaximin and lactulose TID.  3. Note lexapro dose decreased 20mg  > 10mg  due to hepatic impairment.   Home Health: PT ordered Equipment/Devices: None new Discharge Condition: Stable CODE STATUS: Full Diet recommendation: As tolerated  Brief/Interim Summary: Wendy Magouirk Merrittis a 56 y.o.femalewith a history of hepatic cirrhosis, hepatic encephalopathy, nonbleeding esophageal varices, and mood disorder who presented from EMS due to [name] after her roommate had reported abnormal behavior, not taking her medications. On arrival, pt was unable to provide meaningful history. She was altered, but in no distress, without leukocytosis, and abd U/S demonstrated insignificant ascites. Ammonia was elevated and she was admitted for hepatic encephalopathy. She developed a cough on hospital day 1 and CXR demonstrated infiltrate in the posterior left base, so antibiotics were started. Rifaximin and other home medications were restarted, lactulose increased with good effect resulting in resolution of encephalopathy.   Discharge Diagnoses:  Active Problems:   Hepatic encephalopathy (HCC)  Hepatic encephalopathy: Ammonia elevated to 70 on admission, and mentation has improved. She is currently oriented.  - Continue home medications, urged her to be adherent, and scheduled her PCP follow up.   Hepatic cirrhosis: With minimal ascites (unable to find pocket for para in ED)secondary to hepatitis C and prior alcohol use. EMR indicates complete hep C Tx 2016, no recent EtOH. INR 1.44 and albumin low. HIV  nonreactive. - Initial concern for SBP as pt was unable to reliably report abdominal pain. Though with no significant ascites, fever, lactate elevation, or tenderness on my exam, this is felt to be unlikely.  - Continue lasix/spironolactone - Would recommend stopping tylenol  Coagulase negative Staphylococcus in 1 of 4 blood cultures:  - Suspect this is a contaminant, other bottles remain negative at 3 days.  Esophageal varices: No history of bleeding.  - Continue propranolol  Macrocytic anemia: Due to cirrhosis. B12 actually elevated. - Hgb stable.   Stage III CKD: Cr at baseline at 1.17 on arrival.  - Monitor - Avoid nephrotoxins as able. NSAIDs not a good option for pain.  - Tramadol continued with modest total daily dose of 100mg .  LLL pneumonia: Subtle infiltrate seen on better quality CXR 11/5. - Cover with 3rd gen cephalosporin and azithromycin.   Hyponatremia: Resolved, likely due to cirrhosis  Depression/anxiety:  - Held low dose clonazepam while admitted without evidence of withdrawal symptoms.  - Decrease SSRI to 10mg  per pharmacy recommendation given hepatic impairment.  - [facility]  Tremors: Diffuse, has been subacute/chronic problem referred to neurology as outpatient.  - Strongly recommend she follow up with neurology as an outpatient.   Discharge Instructions Discharge Instructions    Diet - low sodium heart healthy   Complete by:  As directed    Discharge instructions   Complete by:  As directed    You were admitted for altered mental status/confusion related to your liver disease and pneumonia. You have improved and are ready for discharge with the following recommendations:  - Keep taking lactulose, make sure you take this 3 times daily, or decrease to twice daily if you have more than 3 bowel movements in a day.  - Keep  taking antibiotics: Azithromycin and omnicef were sent to your pharmacy. Complete these as directed.  - You need to  change your lexapro: Take only a half tablet per day instead of a full tablet. The dose needs to be 10mg  (half your current dose) due to liver disease. - I've scheduled a follow up appointment for you with your PCP next Tuesday. If your symptoms return, seek medical attention right away.   Increase activity slowly   Complete by:  As directed      Allergies as of 06/29/2017      Reactions   Codeine Phosphate Itching   Codeine Rash      Medication List    TAKE these medications   acetaminophen 650 MG CR tablet Commonly known as:  TYLENOL 8 HOUR Take 1 tablet (650 mg total) by mouth every 8 (eight) hours as needed for pain.   azithromycin 250 MG tablet Commonly known as:  ZITHROMAX Take 1 tablet (250 mg total) daily by mouth.   bisacodyl 5 MG EC tablet Commonly known as:  [employer] GENTLE LAXATIVE TAKE 1 TABLET (5 MG TOTAL) BY MOUTH DAILY.   cefdinir 300 MG capsule Commonly known as:  OMNICEF Take 1 capsule (300 mg total) 2 (two) times daily by mouth.   CENTRUM SILVER ULTRA WOMENS PO Take by mouth.   CHANTIX 0.5 MG tablet Generic drug:  varenicline TAKE 1 TABLET BY MOUTH TWICE A DAY   clonazePAM 1 MG tablet Commonly known as:  KLONOPIN Take 0.5-1 tablets (0.5-1 mg total) by mouth 2 (two) times daily as needed for anxiety.   cycloSPORINE 0.05 % ophthalmic emulsion Commonly known as:  RESTASIS Place 1 drop into both eyes 2 (two) times daily.   docusate sodium 100 MG capsule Commonly known as:  COLACE Take 1 capsule (100 mg total) by mouth 2 (two) times daily.   escitalopram 20 MG tablet Commonly known as:  LEXAPRO Take 0.5 tablets (10 mg total) daily by mouth. Stop the 10mg  tablets. Dose increased to 20mg  today What changed:  how much to take   fluticasone 50 MCG/ACT nasal spray Commonly known as:  FLONASE Place 2 sprays into both nostrils daily.   furosemide 40 MG tablet Commonly known as:  LASIX Take 2 tablets (80 mg total) by mouth daily.   gabapentin 300 MG  capsule Commonly known as:  NEURONTIN Take 1 capsule (300 mg total) by mouth 3 (three) times daily.   hydrOXYzine 25 MG tablet Commonly known as:  ATARAX/VISTARIL TAKE 1 TO 2 TABLETS BY MOUTH TWICE A DAY AS NEEDED FOR ITCHING   lactulose 10 GM/15ML solution Commonly known as:  CHRONULAC Take 30 mLs (20 g total) 3 (three) times daily by mouth. Take 2 tbsp by mouth 2 times a day What changed:    how much to take  how to take this  when to take this   levothyroxine 200 MCG tablet Commonly known as:  SYNTHROID, LEVOTHROID Take 1 tablet (200 mcg total) by mouth daily before breakfast.   loratadine 10 MG tablet Commonly known as:  CLARITIN TAKE 1 TABLET BY MOUTH EVERY DAY   montelukast 10 MG tablet Commonly known as:  SINGULAIR TAKE 1 TABLET (10 MG TOTAL) BY MOUTH DAILY.   omeprazole 20 MG capsule Commonly known as:  PRILOSEC TAKE 1 CAPSULE (20 MG TOTAL) BY MOUTH DAILY.   propranolol 60 MG tablet Commonly known as:  INDERAL TAKE 1 TABLET (60 MG TOTAL) BY MOUTH 2 (TWO) TIMES DAILY.   QUEtiapine  25 MG tablet Commonly known as:  SEROQUEL Take 1 tablet (25 mg total) by mouth at bedtime.   ranitidine 300 MG tablet Commonly known as:  ZANTAC TAKE 1 TABLET (300 MG TOTAL) BY MOUTH AT BEDTIME.   rifaximin 550 MG Tabs tablet Commonly known as:  XIFAXAN Take 1 tablet (550 mg total) by mouth 2 (two) times daily.   spironolactone 100 MG tablet Commonly known as:  ALDACTONE Take 2 tablets (200 mg total) by mouth daily.   traMADol 50 MG tablet Commonly known as:  ULTRAM Take 1 tablet (50 mg total) by mouth 2 (two) times daily.   traZODone 50 MG tablet Commonly known as:  DESYREL TAKE 0.5 TABLETS (25 MG TOTAL) BY MOUTH AT BEDTIME AS NEEDED FOR SLEEP.   Vitamin D3 2000 units capsule Take 2,000 Units by mouth daily.      Follow-up Information    [facility] Follow [facility].   Why:  [facility] Contact information: [place] [address] [phone]        Verner Mould, MD Follow up on 07/06/2017.   Specialty:  Family Medicine Why:  1:50pm, please come 20 minutes early.  Contact information: [address] [place] [address] [phone]          Allergies  Allergen Reactions  . Codeine Phosphate Itching  . Codeine Rash    Consultations:  None  Procedures/Studies: Dg Chest 2 View  Result Date: 06/28/2017 CLINICAL DATA:  Pneumonia EXAM: CHEST  2 VIEW COMPARISON:  June 26, 2017 FINDINGS: There is focal opacity in the posterior left base seen only on the lateral view. Lungs elsewhere appear clear. Heart size and pulmonary vascularity are normal. No adenopathy. There is a hiatal hernia. No evident bone lesions. 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 IMPRESSION: Small area of infiltrate posterior left base seen only on the lateral view. Lungs elsewhere clear. Heart size within normal limits.  No adenopathy. Hiatal hernia present. Electronically Signed   By: Lowella Grip III M.D.   On: 06/28/2017 10:39   Dg Chest 2 View  Result Date: 06/26/2017 CLINICAL DATA:  56 year old female with a history of altered mental status EXAM: CHEST  2 VIEW COMPARISON:  09/19/2013 FINDINGS: Cardiomediastinal silhouette unchanged in size and contour. No evidence of central vascular congestion. No pneumothorax. No pleural effusion. Lateral view demonstrates opacity at the posterior lung base. IMPRESSION: Low lung volumes with ill-defined opacity at the posterior lung base on the lateral view, potentially atelectasis or consolidation. Electronically Signed   By: Corrie Mckusick D.O.   On: 06/26/2017 13:57   Ct Head Wo Contrast  Result Date: 06/26/2017 CLINICAL DATA:  Altered mental status. EXAM: CT HEAD WITHOUT CONTRAST TECHNIQUE: Contiguous axial images were obtained from the base of the skull through the vertex without intravenous contrast. COMPARISON:  Brain MR dated 08/29/2014 and head CT dated  01/19/2005. FINDINGS: Brain: Stable right frontal encephalomalacia and small amount of left posterior parietal encephalomalacia. Diffusely enlarged ventricles and subarachnoid spaces. Patchy white matter low density in both cerebral hemispheres. No intracranial hemorrhage, mass lesion or CT evidence of acute infarction. Vascular: No hyperdense vessel or unexpected calcification. Skull: Stable left parietooccipital post craniotomy changes. Sinuses/Orbits: Unremarkable. Other: None. IMPRESSION: 1. No acute abnormality. 2. Stable right frontal and left posterior parietal encephalomalacia. 3. Diffuse cerebral and cerebellar atrophy and chronic small vessel white matter ischemic changes with little change since 08/29/2014. 4. Electronically Signed   By: Claudie Revering  M.D.   On: 06/26/2017 13:58   US Abdomen Limited  Result Date: 06/26/2017 CLINICAL DATA:  Evaluate for ascites.  Abdominal distension. EXAM: LIMITED ABDOMEN ULTRASOUND FOR ASCITES TECHNIQUE: Limited ultrasound survey for ascites was performed in all four abdominal quadrants. COMPARISON:  Ultrasound abdomen 01/22/2017. FINDINGS: Trace amount of fluid within the abdomen. No significant pocket of fluid identified within the quadrants. IMPRESSION: Minimal ascites. Electronically Signed   By: Lovey Newcomer M.D.   On: 06/26/2017 16:11     Subjective: Much more clear today. Wants to go home to vote. No pain or fever.   Discharge Exam: Vitals:   06/29/17 0631 06/29/17 0950  BP: 119/72 120/86  Pulse: 83 85  Resp: 16 18  Temp: 98.3 F (36.8 C) 98.3 F (36.8 C)  SpO2: 97% 100%   Gen: Unkempt 56 y.o. female in no distress HEENT: Poor dentition  Pulm: Non-labored breathing room air. Clear to auscultation bilaterally.  CV: Regular rate and rhythm. No murmur, rub, or gallop. No JVD, trace pedal edema. GI: Abdomen is soft and protuberant, nontender, nondistended, no fluid wave +BS. Skin: No rashes, lesions, ulcers. No erythema on abdomen. No  significant jaundice Neuro: Alert and completely oriented today (previously thought it was 1990-something) and wants to go vote! No focal deficits. Mild diffuse tremor at rest but no asterixis. Psych: Judgement and insight intact.  Labs: Basic Metabolic Panel: Recent Labs  Lab 06/26/17 1243 06/27/17 0632 06/28/17 0529  NA 129* 135 135  K 3.9 3.6 4.5  CL 100* 106 105  CO2 21* 22 21*  GLUCOSE 167* 150* 146*  BUN 19 15 12   CREATININE 1.17* 1.08* 1.29*  CALCIUM 8.7* 8.3* 8.6*   Liver Function Tests: Recent Labs  Lab 06/26/17 1243 06/27/17 0632 06/28/17 0529  AST 59* 53* 57*  ALT 30 26 31   ALKPHOS 136* 102 123  BILITOT 2.5* 2.0* 1.5*  PROT 7.8 6.4* 7.7  ALBUMIN 2.5* 2.0* 2.3*   Recent Labs  Lab 06/26/17 1243  LIPASE 31   Recent Labs  Lab 06/26/17 1243 06/27/17 0632  AMMONIA 70* 60*   CBC: Recent Labs  Lab 06/26/17 1243 06/27/17 0632 06/28/17 0529 06/29/17 0729  WBC 7.0 7.4 18.4* 12.1*  NEUTROABS 4.1 3.6  --   --   HGB 10.3* 9.4* 11.2* 10.1*  HCT 29.8* 27.1* 32.1* 29.2*  MCV 113.3* 114.3* 114.2* 115.0*  PLT 193 184 205 197   Cardiac Enzymes: No results for input(s): CKTOTAL, CKMB, CKMBINDEX, TROPONINI in the last 168 hours. BNP: Invalid input(s): POCBNP CBG: No results for input(s): GLUCAP in the last 168 hours. D-Dimer No results for input(s): DDIMER in the last 72 hours. Hgb A1c No results for input(s): HGBA1C in the last 72 hours. Lipid Profile No results for input(s): CHOL, HDL, LDLCALC, TRIG, CHOLHDL, LDLDIRECT in the last 72 hours. Thyroid function studies No results for input(s): TSH, T4TOTAL, T3FREE, THYROIDAB in the last 72 hours.  Invalid input(s): FREET3 Anemia work up Recent Labs    06/28/17 0529  VITAMINB12 4,419*   Urinalysis    Component Value Date/Time   COLORURINE YELLOW 06/26/2017 1300   APPEARANCEUR CLEAR 06/26/2017 1300   LABSPEC 1.002 (L) 06/26/2017 1300   PHURINE 6.0 06/26/2017 1300   GLUCOSEU NEGATIVE 06/26/2017  1300   HGBUR NEGATIVE 06/26/2017 1300   HGBUR trace-intact 02/10/2008 1504   BILIRUBINUR NEGATIVE 06/26/2017 1300   BILIRUBINUR small 05/04/2016 1441   KETONESUR NEGATIVE 06/26/2017 1300   PROTEINUR NEGATIVE 06/26/2017 1300   UROBILINOGEN 1.0  05/04/2016 1441   UROBILINOGEN 0.2 01/25/2014 0030   NITRITE NEGATIVE 06/26/2017 1300   LEUKOCYTESUR NEGATIVE 06/26/2017 1300    Microbiology Recent Results (from the past 240 hour(s))  Culture, blood (routine x 2)     Status: None (Preliminary result)   Collection Time: 06/26/17  6:37 PM  Result Value Ref Range Status   Specimen Description BLOOD RIGHT ANTECUBITAL  Final   Special Requests   Final    BOTTLES DRAWN AEROBIC AND ANAEROBIC Blood Culture adequate volume   Culture   Final    NO GROWTH 1 DAY Performed at [facility], [place] [address] [place], [place] [address]    Report Status PENDING  Incomplete  Culture, blood (routine x 2)     Status: Abnormal   Collection Time: 06/26/17  6:42 PM  Result Value Ref Range Status   Specimen Description BLOOD LEFT ANTECUBITAL  Final   Special Requests   Final    BOTTLES DRAWN AEROBIC AND ANAEROBIC Blood Culture adequate volume   Culture  Setup Time   Final    GRAM POSITIVE COCCI IN CLUSTERS ANAEROBIC BOTTLE ONLY CRITICAL RESULT CALLED TO, READ BACK BY AND VERIFIED WITH: B.GREEN,PHARMD AT 2141 ON 06/27/17 BY G.MCADOO    Culture (A)  Final    STAPHYLOCOCCUS SPECIES (COAGULASE NEGATIVE) THE SIGNIFICANCE OF ISOLATING THIS ORGANISM FROM A SINGLE SET OF BLOOD CULTURES WHEN MULTIPLE SETS ARE DRAWN IS UNCERTAIN. PLEASE NOTIFY THE MICROBIOLOGY DEPARTMENT WITHIN ONE WEEK IF SPECIATION AND SENSITIVITIES ARE REQUIRED. Performed at [facility], [place] [address], [place], [place] [address]    Report Status 06/29/2017 FINAL  Final  Blood Culture ID Panel (Reflexed)     Status: Abnormal   Collection Time: 06/26/17  6:42 PM  Result Value Ref Range Status   Enterococcus species NOT DETECTED NOT  DETECTED Final   Listeria monocytogenes NOT DETECTED NOT DETECTED Final   Staphylococcus species DETECTED (A) NOT DETECTED Final    Comment: Methicillin (oxacillin) susceptible coagulase negative staphylococcus. Possible blood culture contaminant (unless isolated from more than one blood culture draw or clinical case suggests pathogenicity). No antibiotic treatment is indicated for blood  culture contaminants. CRITICAL RESULT CALLED TO, READ BACK BY AND VERIFIED WITH: B.GREEN,PHARMD AT 2141 ON 06/27/17 BY G.MCADOO    Staphylococcus aureus NOT DETECTED NOT DETECTED Final   Methicillin resistance NOT DETECTED NOT DETECTED Final   Streptococcus species NOT DETECTED NOT DETECTED Final   Streptococcus agalactiae NOT DETECTED NOT DETECTED Final   Streptococcus pneumoniae NOT DETECTED NOT DETECTED Final   Streptococcus pyogenes NOT DETECTED NOT DETECTED Final   Acinetobacter baumannii NOT DETECTED NOT DETECTED Final   Enterobacteriaceae species NOT DETECTED NOT DETECTED Final   Enterobacter cloacae complex NOT DETECTED NOT DETECTED Final   Escherichia coli NOT DETECTED NOT DETECTED Final   Klebsiella oxytoca NOT DETECTED NOT DETECTED Final   Klebsiella pneumoniae NOT DETECTED NOT DETECTED Final   Proteus species NOT DETECTED NOT DETECTED Final   Serratia marcescens NOT DETECTED NOT DETECTED Final   Haemophilus influenzae NOT DETECTED NOT DETECTED Final   Neisseria meningitidis NOT DETECTED NOT DETECTED Final   Pseudomonas aeruginosa NOT DETECTED NOT DETECTED Final   Candida albicans NOT DETECTED NOT DETECTED Final   Candida glabrata NOT DETECTED NOT DETECTED Final   Candida krusei NOT DETECTED NOT DETECTED Final   Candida parapsilosis NOT DETECTED NOT DETECTED Final   Candida tropicalis NOT DETECTED NOT DETECTED Final    Comment: Performed at [facility], [address] [place], [place] [address]    Time coordinating discharge: Approximately 40 minutes  Vance Gather, MD  [facility] 06/29/2017, 12:08 PM Pager [phone]

## 2017-07-02 ENCOUNTER — Other Ambulatory Visit: Payer: Self-pay | Admitting: Internal Medicine

## 2017-07-02 LAB — CULTURE, BLOOD (ROUTINE X 2)
Culture: NO GROWTH
Special Requests: ADEQUATE

## 2017-07-02 NOTE — Telephone Encounter (Signed)
OK x 6

## 2017-07-02 NOTE — Telephone Encounter (Signed)
How many refills Sir, thank you. 

## 2017-07-05 ENCOUNTER — Telehealth: Payer: Self-pay | Admitting: Internal Medicine

## 2017-07-05 ENCOUNTER — Ambulatory Visit (HOSPITAL_COMMUNITY): Payer: Self-pay | Admitting: Psychiatry

## 2017-07-05 DIAGNOSIS — R1310 Dysphagia, unspecified: Secondary | ICD-10-CM | POA: Diagnosis not present

## 2017-07-05 DIAGNOSIS — D638 Anemia in other chronic diseases classified elsewhere: Secondary | ICD-10-CM | POA: Diagnosis not present

## 2017-07-05 DIAGNOSIS — K7460 Unspecified cirrhosis of liver: Secondary | ICD-10-CM | POA: Diagnosis not present

## 2017-07-05 DIAGNOSIS — R251 Tremor, unspecified: Secondary | ICD-10-CM | POA: Diagnosis not present

## 2017-07-05 DIAGNOSIS — I8500 Esophageal varices without bleeding: Secondary | ICD-10-CM | POA: Diagnosis not present

## 2017-07-05 DIAGNOSIS — J181 Lobar pneumonia, unspecified organism: Secondary | ICD-10-CM | POA: Diagnosis not present

## 2017-07-05 DIAGNOSIS — N183 Chronic kidney disease, stage 3 (moderate): Secondary | ICD-10-CM | POA: Diagnosis not present

## 2017-07-05 NOTE — Telephone Encounter (Signed)
Pt's in home care provider called to schedule the pt's follow up appt from being in the hospital. She was also concerned because the pt is having black stool and frequent diarrhea. Pt went to the bathroom 7 times yesterday and has gone 4 times today so far. Pt has her fu appt on Thursday 11/15 and didn't know if she should be seen before that for her stool. Please advise

## 2017-07-05 NOTE — Telephone Encounter (Signed)
Physical therapist called while at pt home doing in home therapy and needs orders to continue doing therapy twice a week for three weeks. Also asked for orders for the pt to have speech therapy to look at her swallowing and occupational therapy to assist with her bathing.  Therapist Amber, her number [phone].

## 2017-07-06 ENCOUNTER — Ambulatory Visit (HOSPITAL_COMMUNITY): Payer: Self-pay | Admitting: Psychiatry

## 2017-07-06 ENCOUNTER — Ambulatory Visit: Payer: Self-pay | Admitting: Internal Medicine

## 2017-07-07 NOTE — Telephone Encounter (Signed)
Returned call to [employer] PT at number provided. No answer but left message on secure voicemail providing the requested orders. I agree that these services are warranted and would be beneficial to patient.   Adin Hector, MD, MPH PGY-3 [facility] Pager [phone]

## 2017-07-08 ENCOUNTER — Inpatient Hospital Stay: Payer: Medicare Other | Admitting: Internal Medicine

## 2017-07-08 ENCOUNTER — Ambulatory Visit: Payer: Self-pay | Admitting: Neurology

## 2017-07-08 NOTE — Telephone Encounter (Signed)
Patient rescheduled appt today, so called to discuss symptoms with her since I could not evaluate her in office. Patient says diarrhea has stopped and stools are no longer black and are well-formed. As such, feel that patient does not need to urgently be seen, but did encourage her to schedule a follow-up appointment so we can discuss her chronic issues. Patient voiced understanding and appreciation.   Adin Hector, MD, MPH PGY-3 [facility] Pager [phone]

## 2017-07-09 DIAGNOSIS — R251 Tremor, unspecified: Secondary | ICD-10-CM | POA: Diagnosis not present

## 2017-07-09 DIAGNOSIS — R1310 Dysphagia, unspecified: Secondary | ICD-10-CM | POA: Diagnosis not present

## 2017-07-09 DIAGNOSIS — J181 Lobar pneumonia, unspecified organism: Secondary | ICD-10-CM | POA: Diagnosis not present

## 2017-07-09 DIAGNOSIS — D638 Anemia in other chronic diseases classified elsewhere: Secondary | ICD-10-CM | POA: Diagnosis not present

## 2017-07-09 DIAGNOSIS — I8500 Esophageal varices without bleeding: Secondary | ICD-10-CM | POA: Diagnosis not present

## 2017-07-09 DIAGNOSIS — N183 Chronic kidney disease, stage 3 (moderate): Secondary | ICD-10-CM | POA: Diagnosis not present

## 2017-07-09 DIAGNOSIS — K7460 Unspecified cirrhosis of liver: Secondary | ICD-10-CM | POA: Diagnosis not present

## 2017-07-09 NOTE — Telephone Encounter (Signed)
Patient left message on nurse line wanting to know if it is OK to take anti-diarrheals. Person from [facility] saw her today and told her she should check with PCP first. Hubbard Hartshorn, RN, BSN

## 2017-07-13 ENCOUNTER — Telehealth: Payer: Self-pay | Admitting: Internal Medicine

## 2017-07-13 ENCOUNTER — Ambulatory Visit: Payer: Self-pay | Admitting: Internal Medicine

## 2017-07-13 DIAGNOSIS — D638 Anemia in other chronic diseases classified elsewhere: Secondary | ICD-10-CM | POA: Diagnosis not present

## 2017-07-13 DIAGNOSIS — N183 Chronic kidney disease, stage 3 (moderate): Secondary | ICD-10-CM | POA: Diagnosis not present

## 2017-07-13 DIAGNOSIS — I8500 Esophageal varices without bleeding: Secondary | ICD-10-CM | POA: Diagnosis not present

## 2017-07-13 DIAGNOSIS — J181 Lobar pneumonia, unspecified organism: Secondary | ICD-10-CM | POA: Diagnosis not present

## 2017-07-13 DIAGNOSIS — R251 Tremor, unspecified: Secondary | ICD-10-CM | POA: Diagnosis not present

## 2017-07-13 DIAGNOSIS — R1310 Dysphagia, unspecified: Secondary | ICD-10-CM | POA: Diagnosis not present

## 2017-07-13 DIAGNOSIS — K7460 Unspecified cirrhosis of liver: Secondary | ICD-10-CM | POA: Diagnosis not present

## 2017-07-13 NOTE — Telephone Encounter (Signed)
Thank you for the information, no charge.

## 2017-07-13 NOTE — Telephone Encounter (Signed)
Alonna Minium with [facility] called to request verbal orders to see pt once a week for five weeks to address swallowing and memory. Ebony Hail can be reached at [phone]

## 2017-07-14 ENCOUNTER — Inpatient Hospital Stay: Payer: Medicare Other | Admitting: Internal Medicine

## 2017-07-14 NOTE — Telephone Encounter (Signed)
Returned phone call to [employer]. Gave verbal orders for continued therapy. Patient has appt later today and can discuss her progress with swallowing and her memory issues.   Adin Hector, MD, MPH PGY-3 [facility] Pager [phone]

## 2017-07-15 ENCOUNTER — Other Ambulatory Visit: Payer: Self-pay | Admitting: Internal Medicine

## 2017-07-21 ENCOUNTER — Inpatient Hospital Stay: Payer: Medicare Other | Admitting: Internal Medicine

## 2017-07-21 ENCOUNTER — Telehealth: Payer: Self-pay | Admitting: *Deleted

## 2017-07-21 DIAGNOSIS — J181 Lobar pneumonia, unspecified organism: Secondary | ICD-10-CM | POA: Diagnosis not present

## 2017-07-21 DIAGNOSIS — R251 Tremor, unspecified: Secondary | ICD-10-CM | POA: Diagnosis not present

## 2017-07-21 DIAGNOSIS — D638 Anemia in other chronic diseases classified elsewhere: Secondary | ICD-10-CM | POA: Diagnosis not present

## 2017-07-21 DIAGNOSIS — I8500 Esophageal varices without bleeding: Secondary | ICD-10-CM | POA: Diagnosis not present

## 2017-07-21 DIAGNOSIS — N183 Chronic kidney disease, stage 3 (moderate): Secondary | ICD-10-CM | POA: Diagnosis not present

## 2017-07-21 DIAGNOSIS — K7460 Unspecified cirrhosis of liver: Secondary | ICD-10-CM | POA: Diagnosis not present

## 2017-07-21 DIAGNOSIS — R1310 Dysphagia, unspecified: Secondary | ICD-10-CM | POA: Diagnosis not present

## 2017-07-21 NOTE — Telephone Encounter (Signed)
Beth called and lm on nurse line.  Her and Speech Therapy have tried to talk pt into making and appt or going to ED.  Pt "flat out refuses" per [facility].  Will forward to MD.  Elois Averitt, Salome Spotted, [place]

## 2017-07-21 NOTE — Telephone Encounter (Signed)
Wendy [name] states that she just recently left patient's home after doing PT and said that patient seemed to be "talking in circles".  Per voicemail left on nurse line Wendy [name] was concerned for a possible UTI or maybe elevated ammonia levels.  LM for Wendy [name] asking her to call back as patient was originally scheduled to see PCP today but it was cancelled.  I asked her to call back and we could get patient an appointment this week if Wendy [name] is able to come.  Will forward to red team to try and contact patient to offer an appt. Wendy [name],CMA

## 2017-07-22 NOTE — Telephone Encounter (Signed)
Called patient to encourage her to be seen given PT and speech therapy concerns. No answer. Left detailed voice message explaining why PT/speech therapy and myself are concerned and feel that she needs to be seen. Let patient know that if she wants to be seen this afternoon, she can walk in and I will see her at whatever time she arrives. Also let her know that if she wants to be seen tomorrow, I will not be here, but she can call to schedule a same day appointment or walk in. Finally let her know that if for some reason she cannot or does not want to come to clinic, she can go to urgent care or the emergency room. Stressed once again how important I feel it is for her to be seen by someone as soon as possible.  Of note, patient with long history of non-compliance and no shows for appointments. She has severe anxiety and agoraphobia, and leaving the house is very difficult for her. Tried to schedule home visit with her a few weeks ago and patient declined. I too feel that patient needs to be seen regularly both by myself and by her specialists, however we have not been able to find a solution to allow her to keep her appointments thus far.   If patient calls to schedule an appointment, please either double book my schedule this afternoon (11/29) at any time or let her know she can walk in at any time this afternoon and I will see her. If she calls and wants to be seen tomorrow, please encourage her to walk in if there are no available same day slots. Thank you!   Adin Hector, MD, MPH PGY-3 [facility] Pager [phone]

## 2017-07-23 ENCOUNTER — Telehealth: Payer: Self-pay | Admitting: *Deleted

## 2017-07-23 ENCOUNTER — Other Ambulatory Visit: Payer: Self-pay | Admitting: Internal Medicine

## 2017-07-23 NOTE — Telephone Encounter (Signed)
Beth wanted to let MD know that pt canceled PT visit today as she was not feeling well.  Beth did want to relay to MD that pt did not sound as confused today and she encouraged her to keep appt on Monday.  Pt told Eustaquio Maize that she would go to a walk-in clinic this weekend if not feeling better.   Marcee Jacobs, Salome Spotted, CMA

## 2017-07-26 ENCOUNTER — Inpatient Hospital Stay: Payer: Medicare Other | Admitting: Internal Medicine

## 2017-07-26 ENCOUNTER — Other Ambulatory Visit: Payer: Self-pay

## 2017-07-26 ENCOUNTER — Emergency Department (HOSPITAL_COMMUNITY): Payer: Medicare Other

## 2017-07-26 ENCOUNTER — Encounter (HOSPITAL_COMMUNITY): Payer: Self-pay | Admitting: Emergency Medicine

## 2017-07-26 ENCOUNTER — Inpatient Hospital Stay (HOSPITAL_COMMUNITY)
Admission: EM | Admit: 2017-07-26 | Discharge: 2017-08-03 | DRG: 441 | Disposition: A | Discharge status: Other Acute Inpatient Hospital | Payer: Medicare Other | Attending: Pulmonary Disease | Admitting: Pulmonary Disease

## 2017-07-26 DIAGNOSIS — J948 Other specified pleural conditions: Secondary | ICD-10-CM | POA: Diagnosis not present

## 2017-07-26 DIAGNOSIS — Z79899 Other long term (current) drug therapy: Secondary | ICD-10-CM

## 2017-07-26 DIAGNOSIS — Z8249 Family history of ischemic heart disease and other diseases of the circulatory system: Secondary | ICD-10-CM

## 2017-07-26 DIAGNOSIS — E876 Hypokalemia: Secondary | ICD-10-CM | POA: Diagnosis not present

## 2017-07-26 DIAGNOSIS — J918 Pleural effusion in other conditions classified elsewhere: Secondary | ICD-10-CM | POA: Diagnosis not present

## 2017-07-26 DIAGNOSIS — I313 Pericardial effusion (noninflammatory): Secondary | ICD-10-CM | POA: Diagnosis present

## 2017-07-26 DIAGNOSIS — R404 Transient alteration of awareness: Secondary | ICD-10-CM | POA: Diagnosis not present

## 2017-07-26 DIAGNOSIS — K7460 Unspecified cirrhosis of liver: Secondary | ICD-10-CM | POA: Diagnosis present

## 2017-07-26 DIAGNOSIS — F4001 Agoraphobia with panic disorder: Secondary | ICD-10-CM | POA: Diagnosis present

## 2017-07-26 DIAGNOSIS — Z8619 Personal history of other infectious and parasitic diseases: Secondary | ICD-10-CM

## 2017-07-26 DIAGNOSIS — I8500 Esophageal varices without bleeding: Secondary | ICD-10-CM | POA: Diagnosis not present

## 2017-07-26 DIAGNOSIS — I10 Essential (primary) hypertension: Secondary | ICD-10-CM | POA: Diagnosis present

## 2017-07-26 DIAGNOSIS — S069X9A Unspecified intracranial injury with loss of consciousness of unspecified duration, initial encounter: Secondary | ICD-10-CM | POA: Diagnosis present

## 2017-07-26 DIAGNOSIS — N183 Chronic kidney disease, stage 3 (moderate): Secondary | ICD-10-CM | POA: Diagnosis not present

## 2017-07-26 DIAGNOSIS — N189 Chronic kidney disease, unspecified: Secondary | ICD-10-CM | POA: Diagnosis present

## 2017-07-26 DIAGNOSIS — Z9119 Patient's noncompliance with other medical treatment and regimen: Secondary | ICD-10-CM

## 2017-07-26 DIAGNOSIS — Z9114 Patient's other noncompliance with medication regimen: Secondary | ICD-10-CM

## 2017-07-26 DIAGNOSIS — F17210 Nicotine dependence, cigarettes, uncomplicated: Secondary | ICD-10-CM | POA: Diagnosis present

## 2017-07-26 DIAGNOSIS — Z792 Long term (current) use of antibiotics: Secondary | ICD-10-CM

## 2017-07-26 DIAGNOSIS — R7303 Prediabetes: Secondary | ICD-10-CM | POA: Diagnosis present

## 2017-07-26 DIAGNOSIS — K7682 Hepatic encephalopathy: Secondary | ICD-10-CM | POA: Diagnosis present

## 2017-07-26 DIAGNOSIS — R6889 Other general symptoms and signs: Secondary | ICD-10-CM | POA: Diagnosis not present

## 2017-07-26 DIAGNOSIS — Z79891 Long term (current) use of opiate analgesic: Secondary | ICD-10-CM

## 2017-07-26 DIAGNOSIS — D689 Coagulation defect, unspecified: Secondary | ICD-10-CM | POA: Diagnosis present

## 2017-07-26 DIAGNOSIS — I340 Nonrheumatic mitral (valve) insufficiency: Secondary | ICD-10-CM | POA: Diagnosis not present

## 2017-07-26 DIAGNOSIS — B1710 Acute hepatitis C without hepatic coma: Secondary | ICD-10-CM | POA: Diagnosis not present

## 2017-07-26 DIAGNOSIS — G8929 Other chronic pain: Secondary | ICD-10-CM | POA: Diagnosis present

## 2017-07-26 DIAGNOSIS — I129 Hypertensive chronic kidney disease with stage 1 through stage 4 chronic kidney disease, or unspecified chronic kidney disease: Secondary | ICD-10-CM | POA: Diagnosis present

## 2017-07-26 DIAGNOSIS — Z87820 Personal history of traumatic brain injury: Secondary | ICD-10-CM

## 2017-07-26 DIAGNOSIS — Z885 Allergy status to narcotic agent status: Secondary | ICD-10-CM

## 2017-07-26 DIAGNOSIS — K7290 Hepatic failure, unspecified without coma: Principal | ICD-10-CM | POA: Diagnosis present

## 2017-07-26 DIAGNOSIS — Z841 Family history of disorders of kidney and ureter: Secondary | ICD-10-CM

## 2017-07-26 DIAGNOSIS — Z9081 Acquired absence of spleen: Secondary | ICD-10-CM

## 2017-07-26 DIAGNOSIS — K766 Portal hypertension: Secondary | ICD-10-CM | POA: Diagnosis not present

## 2017-07-26 DIAGNOSIS — D539 Nutritional anemia, unspecified: Secondary | ICD-10-CM | POA: Diagnosis not present

## 2017-07-26 DIAGNOSIS — K449 Diaphragmatic hernia without obstruction or gangrene: Secondary | ICD-10-CM | POA: Diagnosis present

## 2017-07-26 DIAGNOSIS — F329 Major depressive disorder, single episode, unspecified: Secondary | ICD-10-CM | POA: Diagnosis present

## 2017-07-26 DIAGNOSIS — J90 Pleural effusion, not elsewhere classified: Secondary | ICD-10-CM

## 2017-07-26 DIAGNOSIS — Z789 Other specified health status: Secondary | ICD-10-CM | POA: Diagnosis present

## 2017-07-26 DIAGNOSIS — G4733 Obstructive sleep apnea (adult) (pediatric): Secondary | ICD-10-CM | POA: Diagnosis present

## 2017-07-26 DIAGNOSIS — J9601 Acute respiratory failure with hypoxia: Secondary | ICD-10-CM | POA: Diagnosis present

## 2017-07-26 DIAGNOSIS — Z8781 Personal history of (healed) traumatic fracture: Secondary | ICD-10-CM

## 2017-07-26 DIAGNOSIS — W1830XA Fall on same level, unspecified, initial encounter: Secondary | ICD-10-CM | POA: Diagnosis present

## 2017-07-26 DIAGNOSIS — F0280 Dementia in other diseases classified elsewhere without behavioral disturbance: Secondary | ICD-10-CM | POA: Diagnosis present

## 2017-07-26 DIAGNOSIS — K222 Esophageal obstruction: Secondary | ICD-10-CM | POA: Diagnosis present

## 2017-07-26 DIAGNOSIS — R0602 Shortness of breath: Secondary | ICD-10-CM

## 2017-07-26 DIAGNOSIS — G4700 Insomnia, unspecified: Secondary | ICD-10-CM | POA: Diagnosis present

## 2017-07-26 DIAGNOSIS — R4182 Altered mental status, unspecified: Secondary | ICD-10-CM | POA: Diagnosis present

## 2017-07-26 DIAGNOSIS — M545 Low back pain: Secondary | ICD-10-CM | POA: Diagnosis present

## 2017-07-26 DIAGNOSIS — Z818 Family history of other mental and behavioral disorders: Secondary | ICD-10-CM

## 2017-07-26 DIAGNOSIS — R188 Other ascites: Secondary | ICD-10-CM | POA: Diagnosis not present

## 2017-07-26 DIAGNOSIS — R0902 Hypoxemia: Secondary | ICD-10-CM

## 2017-07-26 DIAGNOSIS — N179 Acute kidney failure, unspecified: Secondary | ICD-10-CM | POA: Diagnosis not present

## 2017-07-26 DIAGNOSIS — G9341 Metabolic encephalopathy: Secondary | ICD-10-CM | POA: Diagnosis not present

## 2017-07-26 DIAGNOSIS — E039 Hypothyroidism, unspecified: Secondary | ICD-10-CM | POA: Diagnosis present

## 2017-07-26 DIAGNOSIS — K7291 Hepatic failure, unspecified with coma: Secondary | ICD-10-CM | POA: Diagnosis not present

## 2017-07-26 DIAGNOSIS — M549 Dorsalgia, unspecified: Secondary | ICD-10-CM

## 2017-07-26 DIAGNOSIS — Z9989 Dependence on other enabling machines and devices: Secondary | ICD-10-CM

## 2017-07-26 DIAGNOSIS — M810 Age-related osteoporosis without current pathological fracture: Secondary | ICD-10-CM | POA: Diagnosis present

## 2017-07-26 DIAGNOSIS — K7031 Alcoholic cirrhosis of liver with ascites: Secondary | ICD-10-CM | POA: Diagnosis present

## 2017-07-26 DIAGNOSIS — J690 Pneumonitis due to inhalation of food and vomit: Secondary | ICD-10-CM | POA: Diagnosis not present

## 2017-07-26 DIAGNOSIS — K219 Gastro-esophageal reflux disease without esophagitis: Secondary | ICD-10-CM | POA: Diagnosis present

## 2017-07-26 DIAGNOSIS — Z4659 Encounter for fitting and adjustment of other gastrointestinal appliance and device: Secondary | ICD-10-CM

## 2017-07-26 DIAGNOSIS — S0990XA Unspecified injury of head, initial encounter: Secondary | ICD-10-CM | POA: Diagnosis not present

## 2017-07-26 DIAGNOSIS — S0990XS Unspecified injury of head, sequela: Secondary | ICD-10-CM

## 2017-07-26 DIAGNOSIS — K224 Dyskinesia of esophagus: Secondary | ICD-10-CM | POA: Diagnosis present

## 2017-07-26 DIAGNOSIS — Z79890 Hormone replacement therapy (postmenopausal): Secondary | ICD-10-CM

## 2017-07-26 DIAGNOSIS — Z6837 Body mass index (BMI) 37.0-37.9, adult: Secondary | ICD-10-CM

## 2017-07-26 DIAGNOSIS — S069XAA Unspecified intracranial injury with loss of consciousness status unknown, initial encounter: Secondary | ICD-10-CM | POA: Diagnosis present

## 2017-07-26 DIAGNOSIS — G9389 Other specified disorders of brain: Secondary | ICD-10-CM

## 2017-07-26 DIAGNOSIS — S299XXA Unspecified injury of thorax, initial encounter: Secondary | ICD-10-CM | POA: Diagnosis not present

## 2017-07-26 DIAGNOSIS — K3189 Other diseases of stomach and duodenum: Secondary | ICD-10-CM | POA: Diagnosis present

## 2017-07-26 HISTORY — DX: Other ascites: R18.8

## 2017-07-26 HISTORY — DX: Pleural effusion, not elsewhere classified: J90

## 2017-07-26 LAB — COMPREHENSIVE METABOLIC PANEL
ALBUMIN: 2.1 g/dL — AB (ref 3.5–5.0)
ALK PHOS: 130 U/L — AB (ref 38–126)
ALT: 28 U/L (ref 14–54)
AST: 67 U/L — AB (ref 15–41)
Anion gap: 11 (ref 5–15)
BILIRUBIN TOTAL: 1.8 mg/dL — AB (ref 0.3–1.2)
BUN: 31 mg/dL — AB (ref 6–20)
CALCIUM: 8.6 mg/dL — AB (ref 8.9–10.3)
CO2: 18 mmol/L — ABNORMAL LOW (ref 22–32)
CREATININE: 1.8 mg/dL — AB (ref 0.44–1.00)
Chloride: 101 mmol/L (ref 101–111)
GFR calc Af Amer: 35 mL/min — ABNORMAL LOW (ref >60)
GFR, EST NON AFRICAN AMERICAN: 30 mL/min — AB (ref >60)
GLUCOSE: 141 mg/dL — AB (ref 65–99)
POTASSIUM: 4.5 mmol/L (ref 3.5–5.1)
Sodium: 130 mmol/L — ABNORMAL LOW (ref 135–145)
TOTAL PROTEIN: 7.3 g/dL (ref 6.5–8.1)

## 2017-07-26 LAB — URINALYSIS, ROUTINE W REFLEX MICROSCOPIC
BILIRUBIN URINE: NEGATIVE
GLUCOSE, UA: NEGATIVE mg/dL
HGB URINE DIPSTICK: NEGATIVE
KETONES UR: NEGATIVE mg/dL
Leukocytes, UA: NEGATIVE
Nitrite: NEGATIVE
PH: 6 (ref 5.0–8.0)
PROTEIN: NEGATIVE mg/dL
Specific Gravity, Urine: 1.005 (ref 1.005–1.030)

## 2017-07-26 LAB — CBC WITH DIFFERENTIAL/PLATELET
BASOS ABS: 0 10*3/uL (ref 0.0–0.1)
Basophils Relative: 0 %
EOS PCT: 0 %
Eosinophils Absolute: 0 10*3/uL (ref 0.0–0.7)
HEMATOCRIT: 25 % — AB (ref 36.0–46.0)
Hemoglobin: 8.7 g/dL — ABNORMAL LOW (ref 12.0–15.0)
LYMPHS ABS: 0.7 10*3/uL (ref 0.7–4.0)
LYMPHS PCT: 8 %
MCH: 37.3 pg — ABNORMAL HIGH (ref 26.0–34.0)
MCHC: 34.8 g/dL (ref 30.0–36.0)
MCV: 107.3 fL — AB (ref 78.0–100.0)
MONO ABS: 1.6 10*3/uL — AB (ref 0.1–1.0)
Monocytes Relative: 17 %
NEUTROS ABS: 7.1 10*3/uL (ref 1.7–7.7)
Neutrophils Relative %: 75 %
Platelets: 297 10*3/uL (ref 150–400)
RBC: 2.33 MIL/uL — ABNORMAL LOW (ref 3.87–5.11)
RDW: 15.4 % (ref 11.5–15.5)
WBC: 9.4 10*3/uL (ref 4.0–10.5)

## 2017-07-26 LAB — AMMONIA: AMMONIA: 99 umol/L — AB (ref 9–35)

## 2017-07-26 LAB — PROTIME-INR
INR: 1.69
Prothrombin Time: 19.8 seconds — ABNORMAL HIGH (ref 11.4–15.2)

## 2017-07-26 LAB — ETHANOL: Alcohol, Ethyl (B): <10 mg/dL (ref <10)

## 2017-07-26 MED ORDER — LORAZEPAM 2 MG/ML IJ SOLN
1.0000 mg | Freq: Once | INTRAMUSCULAR | Status: AC
  Administered 2017-07-26: 1 mg via INTRAVENOUS
  Filled 2017-07-26: qty 1

## 2017-07-26 MED ORDER — LORAZEPAM 2 MG/ML IJ SOLN
1.0000 mg | Freq: Once | INTRAMUSCULAR | Status: AC
Start: 2017-07-26 — End: 2017-07-26
  Administered 2017-07-26: 1 mg via INTRAVENOUS
  Filled 2017-07-26: qty 1

## 2017-07-26 NOTE — ED Notes (Signed)
Unable to measure BP and keep cardiac monitor on patient due to patient moving too much and peeling off cuff and leads. EDP made aware. Stated that she can have 1mg  of ativan again.

## 2017-07-26 NOTE — ED Triage Notes (Signed)
Per EMS, patient is coming from home where she sustained an unwitnessed fall.  EKG unremarkable.  Altered mental status.  Hx of TBI. Recently in [facility] with high ammonia levels and infection.  Afebrile.  Belly distended. Dry heaving.  342 systolic palpated, HR 80, 16 RR, 97% RA.  Cap refill 2 seconds.

## 2017-07-26 NOTE — ED Provider Notes (Signed)
[facility] EMERGENCY DEPARTMENT Provider Note   CSN: [id number] Arrival date & time: 07/26/17  2011     History   Chief Complaint Chief Complaint  Patient presents with  . Fall  . Altered Mental Status   Level 5 caveat due to altered mental status. HPI Wendy [name] is a 56 y.o. female.  HPI Patient with altered mental status.  Reportedly found on the floor.  History of cirrhosis and hepatic encephalopathy.  Patient really cannot provide history.  Will move all her extremities but incoherent. Past Medical History:  Diagnosis Date  . Allergic rhinitis   . Anxiety and depression   . Arthritis   . Blood transfusion   . Cataract    MILD  . Clotting disorder ([place])    prolonged clotting time due to liver disease  . Compression fracture 09/21/2013  . 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 Esophageal stricture    esophageal dysmotility and chronic dysphagia as well  . Esophageal varices ([place])   . GERD (gastroesophageal reflux disease)   . Hepatic cirrhosis due to chronic hepatitis C infection ([place])    ETOH causative as well  . Hepatic encephalopathy syndrome ([place])   . Hiatal hernia   . History of alcohol abuse   . History of hip fracture   . HTN (hypertension)   . Osteoporosis   . 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 Paraesophageal hernia   . Paraesophageal hiatal hernia 10/17/2015  . 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 Ulcer    2007    Patient Active Problem List   Diagnosis Date Noted  . Hepatic encephalopathy ([place]) 06/26/2017  . Tremor 01/20/2017  . Fluctuation of weight 08/27/2016  . Medically complex patient 08/27/2016  . Paresthesia of lower extremity 07/23/2016  . Generalized weakness 06/18/2016  . Eye drainage 04/29/2016  . Nasal congestion 04/29/2016  . Routine health maintenance 04/29/2016  . Nocturia 02/27/2016  . Morbid  obesity ([place]) 02/07/2016  . 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 Pre-diabetes 11/01/2015  . TBI (traumatic brain injury) ([place]) 10/25/2015  . OSA on CPAP 10/25/2015  . Paraesophageal hiatal hernia 10/17/2015  . Chronic low back pain without sciatica 06/28/2015  . OSA (obstructive sleep apnea) 06/12/2015  . Excessive daytime sleepiness 04/11/2015  . Episodic cluster headache, not intractable 04/11/2015  . Obesity, morbid ([place]) 04/11/2015  . Insomnia 12/27/2014  . Hypothyroidism 09/04/2014  . Cognitive and behavioral changes 07/17/2014  . History of traumatic brain injury 07/10/2014  . Recurrent falls 06/29/2014  . Osteopenia 04/04/2014  . Esophageal varices in cirrhosis - trace 03/19/2014  . Anemia 02/01/2014  . Chronic kidney disease, unspecified ([place]) 02/01/2014  . Chronic back pain 02/01/2014  . Stricture and stenosis of esophagus 01/26/2014  . Fall at home 01/24/2014  . Near syncope 01/12/2014  . Compression fracture 09/21/2013  . Atypical chest pain 07/26/2013  . Ascites 07/19/2013  . Dyspnea 02/03/2013  . Tobacco abuse 10/22/2012  . Allergic conjunctivitis 06/19/2012  . Increased frequency of urination 02/05/2012  . Thinning hair 12/25/2011  . Lumbar spondylosis 12/24/2011  . Obesity (BMI 35.0-39.9 without comorbidity) 12/08/2011  . Right hip pain 12/01/2011  . Chronic hepatitis C - genotype 1A 10/06/2011  . GERD (gastroesophageal reflux disease) 10/06/2011  . Hepatic encephalopathy syndrome ([place]) 07/28/2011  . Back pain 04/07/2011  . 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  THROMBOCYTOPENIA 10/21/2006  . Depression, major, recurrent, moderate ([place]) 10/21/2006  . HYPERTENSION, BENIGN SYSTEMIC 10/21/2006  . Hepatic cirrhosis ([place]) 10/21/2006  . Generalized pruritus 10/21/2006    Past Surgical History:  Procedure Laterality Date  . BURR HOLE FOR SUBDURAL HEMATOMA  2004  . COLONOSCOPY  08/2012   moderatel left colon  tics  . ESOPHAGOGASTRODUODENOSCOPY (EGD) WITH PROPOFOL N/A 01/26/2014   Procedure: ESOPHAGOGASTRODUODENOSCOPY (EGD) WITH PROPOFOL;  Surgeon: [place] Dragon, MD;  Location: [facility] ENDOSCOPY;  Service: Endoscopy;  Laterality: N/A;  . KYPHOPLASTY Bilateral 09/21/2013   Procedure: T12 - L1 KYPHOPLASTY;  Surgeon: Melina Schools, MD;  Location: [place];  Service: Orthopedics;  Laterality: Bilateral;  . NASAL SINUS SURGERY    . ORIF HIP FRACTURE  2010   right x2  . SPLENECTOMY     age 64  . UPPER GASTROINTESTINAL ENDOSCOPY  08/05/2007   esophageal ring, hiatal hernia, portal gastropathy  . UPPER GASTROINTESTINAL ENDOSCOPY  10/14/2011    OB History    No data available       Home Medications    Prior to Admission medications   Medication Sig Start Date End Date Taking? Authorizing Provider  acetaminophen (TYLENOL 8 HOUR) 650 MG CR tablet Take 1 tablet (650 mg total) by mouth every 8 (eight) hours as needed for pain. 10/27/16   Mayo, Pete Pelt, MD  azithromycin (ZITHROMAX) 250 MG tablet Take 1 tablet (250 mg total) daily by mouth. 06/29/17   Patrecia Pour, MD  bisacodyl ([employer] GENTLE LAXATIVE) 5 MG EC tablet TAKE 1 TABLET (5 MG TOTAL) BY MOUTH DAILY. Patient not taking: Reported on 06/26/2017 10/26/14   Olin Hauser, DO  cefdinir (OMNICEF) 300 MG capsule Take 1 capsule (300 mg total) 2 (two) times daily by mouth. 06/29/17   Patrecia Pour, MD  CHANTIX 0.5 MG tablet TAKE 1 TABLET BY MOUTH TWICE A DAY 06/01/17   Verner Mould, MD  Cholecalciferol (VITAMIN D3) 2000 UNITS capsule Take 2,000 Units by mouth daily.    [provider]  clonazePAM (KLONOPIN) 1 MG tablet Take 0.5-1 tablets (0.5-1 mg total) by mouth 2 (two) times daily as needed for anxiety. 04/23/17   Verner Mould, MD  cycloSPORINE (RESTASIS) 0.05 % ophthalmic emulsion Place 1 drop into both eyes 2 (two) times daily. 04/28/16   Rogue Bussing, MD  docusate sodium (COLACE) 100 MG capsule TAKE 1 CAPSULE BY  MOUTH TWICE A DAY 07/23/17   Gatha Mayer, MD  escitalopram (LEXAPRO) 20 MG tablet Take 0.5 tablets (10 mg total) daily by mouth. Stop the 10mg  tablets. Dose increased to 20mg  today 06/29/17   Patrecia Pour, MD  fluticasone [facility]) 50 MCG/ACT nasal spray Place 2 sprays into both nostrils daily. 04/28/16   Rogue Bussing, MD  furosemide (LASIX) 40 MG tablet Take 2 tablets (80 mg total) by mouth daily. 05/20/17   Gatha Mayer, MD  gabapentin (NEURONTIN) 300 MG capsule Take 1 capsule (300 mg total) by mouth 3 (three) times daily. 12/16/16   Mikell, Jeani Sow, MD  hydrOXYzine (ATARAX/VISTARIL) 25 MG tablet TAKE 1 TO 2 TABLETS BY MOUTH TWICE A DAY AS NEEDED FOR ITCHING 06/01/17   Verner Mould, MD  lactulose [facility]) 10 GM/15ML solution Take 30 mLs (20 g total) 3 (three) times daily by mouth. Take 2 tbsp by mouth 2 times a day 06/29/17   Patrecia Pour, MD  levothyroxine (SYNTHROID, LEVOTHROID) 200 MCG tablet Take 1 tablet (200 mcg total)  by mouth daily before breakfast. 06/22/16   Verner Mould, MD  loratadine (CLARITIN) 10 MG tablet TAKE 1 TABLET BY MOUTH EVERY DAY 04/20/17   Verner Mould, MD  montelukast (SINGULAIR) 10 MG tablet TAKE 1 TABLET (10 MG TOTAL) BY MOUTH DAILY. 04/09/17   Kozlow, Donnamarie Poag, MD  Multiple Vitamins-Minerals (CENTRUM SILVER ULTRA WOMENS PO) Take by mouth.    [provider]  omeprazole (PRILOSEC) 20 MG capsule TAKE 1 CAPSULE (20 MG TOTAL) BY MOUTH DAILY. 12/08/16   Verner Mould, MD  propranolol (INDERAL) 60 MG tablet TAKE 1 TABLET (60 MG TOTAL) BY MOUTH 2 (TWO) TIMES DAILY. 07/19/17   Verner Mould, MD  QUEtiapine (SEROQUEL) 25 MG tablet Take 1 tablet (25 mg total) by mouth at bedtime. 01/22/17   Merian Capron, MD  ranitidine (ZANTAC) 300 MG tablet TAKE 1 TABLET (300 MG TOTAL) BY MOUTH AT BEDTIME. 07/19/17   Gatha Mayer, MD  rifaximin (XIFAXAN) 550 MG TABS tablet Take 1 tablet (550 mg total) by  mouth 2 (two) times daily. 06/18/15   Gatha Mayer, MD  spironolactone (ALDACTONE) 100 MG tablet Take 2 tablets (200 mg total) by mouth daily. 06/18/17   Gatha Mayer, MD  spironolactone (ALDACTONE) 100 MG tablet TAKE 1.5 TABLETS (150 MG TOTAL) BY MOUTH DAILY. 07/02/17   Gatha Mayer, MD  traMADol (ULTRAM) 50 MG tablet Take 1 tablet (50 mg total) by mouth 2 (two) times daily. 06/10/17   Verner Mould, MD  traZODone (DESYREL) 50 MG tablet TAKE 0.5 TABLETS (25 MG TOTAL) BY MOUTH AT BEDTIME AS NEEDED FOR SLEEP. 06/07/17   Verner Mould, MD    Family History Family History  Problem Relation Age of Onset  . Heart disease Mother   . Anxiety disorder Mother   . Other Daughter        chromosome abnormalit  . Other Daughter        micropthalmia  . Cancer Maternal Grandmother   . Kidney failure Sister   . Colon cancer Neg Hx   . Esophageal cancer Neg Hx   . Rectal cancer Neg Hx   . Stomach cancer Neg Hx     Social History Social History   Tobacco Use  . Smoking status: Current Every Day Smoker    Packs/day: 0.25    Years: 10.00    Pack years: 2.50    Types: Cigarettes  . Smokeless tobacco: Never Used  . Tobacco comment: reduce # of cigarettes  Substance Use Topics  . Alcohol use: No    Alcohol/week: 0.0 oz    Comment: history of attending AA  . Drug use: No     Allergies   Codeine phosphate and Codeine   Review of Systems Review of Systems  Unable to perform ROS: Mental status change     Physical Exam Updated Vital Signs BP 123/74   Pulse 90   Temp (!) 97.1 F (36.2 C) (Rectal)   Resp 19   Ht 5\' 6"  (1.676 m)   Wt 92.1 kg (203 lb)   SpO2 92%   BMI 32.77 kg/m   Physical Exam  Constitutional: She appears well-developed.  HENT:  Head: Atraumatic.  Eyes: Pupils are equal, round, and reactive to light.  Neck: Neck supple.  Cardiovascular:  Tachycardic, but patient is agitated and fighting with staff.  Pulmonary/Chest: No  respiratory distress.  Abdominal: There is no tenderness.  Musculoskeletal: She exhibits edema.  Pitting edema bilateral lower extremities  Neurological:  Patient is altered.  Will really not follow commands.  Has done some yelling.  She is moving all extremities.  Skin: Skin is warm. Capillary refill takes less than 2 seconds.     ED Treatments / Results  Labs (all labs ordered are listed, but only abnormal results are displayed) Labs Reviewed  PROTIME-INR - Abnormal; Notable for the following components:      Result Value   Prothrombin Time 19.8 (*)    All other components within normal limits  CBC WITH DIFFERENTIAL/PLATELET - Abnormal; Notable for the following components:   RBC 2.33 (*)    Hemoglobin 8.7 (*)    HCT 25.0 (*)    MCV 107.3 (*)    MCH 37.3 (*)    Monocytes Absolute 1.6 (*)    All other components within normal limits  COMPREHENSIVE METABOLIC PANEL - Abnormal; Notable for the following components:   Sodium 130 (*)    CO2 18 (*)    Glucose, Bld 141 (*)    BUN 31 (*)    Creatinine, Ser 1.80 (*)    Calcium 8.6 (*)    Albumin 2.1 (*)    AST 67 (*)    Alkaline Phosphatase 130 (*)    Total Bilirubin 1.8 (*)    GFR calc non Af Amer 30 (*)    GFR calc Af Amer 35 (*)    All other components within normal limits  AMMONIA - Abnormal; Notable for the following components:   Ammonia 99 (*)    All other components within normal limits  URINALYSIS, ROUTINE W REFLEX MICROSCOPIC - Abnormal; Notable for the following components:   Color, Urine STRAW (*)    All other components within normal limits  ETHANOL    EKG  EKG Interpretation  Date/Time:  Monday July 26 2017 20:19:32 EST Ventricular Rate:  87 PR Interval:    QRS Duration: 85 QT Interval:  441 QTC Calculation: 531 R Axis:   90 Text Interpretation:  Right and left arm electrode reversal, interpretation assumes no reversal Sinus rhythm Borderline right axis deviation Low voltage, extremity leads  Prolonged QT interval Confirmed by Davonna Belling [phone]) on 07/26/2017 9:25:40 PM       Radiology Dg Chest Portable 1 View  Result Date: 07/26/2017 CLINICAL DATA:  Unwitnessed fall. EXAM: PORTABLE CHEST 1 VIEW COMPARISON:  06/28/2017. FINDINGS: Low lung volumes with mild interstitial edema. Heart is top-normal in size. No aortic aneurysm. Small peripheral opacity at the right lung base may represent a focus of pleural thickening, loculated fluid or atelectasis. No acute osseous appearing abnormality. IMPRESSION: 1. Small peripheral opacity at the right lung base may represent focal pleural thickening, small fluid loculation or atelectasis. Mild interstitial edema with low lung volumes. Electronically Signed   By: Ashley Royalty M.D.   On: 07/26/2017 21:26    Procedures Procedures (including critical care time)  Medications Ordered in ED Medications  LORazepam (ATIVAN) injection 1 mg (1 mg Intravenous Given 07/26/17 2136)  LORazepam (ATIVAN) injection 1 mg (1 mg Intravenous Given 07/26/17 2202)     Initial Impression / Assessment and Plan / ED Course  I have reviewed the triage vital signs and the nursing notes.  Pertinent labs & imaging results that were available during my care of the patient were reviewed by me and considered in my medical decision making (see chart for details).     Patient with mental status changes.  Likely hepatic encephalopathy.  Has had history of same.  Ammonia  elevated.  Labs otherwise near baseline but with mildly worsening anemia.  Chest x-ray and urine reassuring.  Head CT pending but will require admission.  Will admit to family practice.   Final Clinical Impressions(s) / ED Diagnoses   Final diagnoses:  Hepatic encephalopathy [facility])    ED Discharge Orders    None       Davonna Belling, MD 07/26/17 2317

## 2017-07-27 ENCOUNTER — Telehealth: Payer: Self-pay | Admitting: Internal Medicine

## 2017-07-27 ENCOUNTER — Observation Stay (HOSPITAL_COMMUNITY): Payer: Medicare Other

## 2017-07-27 ENCOUNTER — Inpatient Hospital Stay (HOSPITAL_COMMUNITY): Payer: Medicare Other

## 2017-07-27 DIAGNOSIS — R6521 Severe sepsis with septic shock: Secondary | ICD-10-CM | POA: Diagnosis not present

## 2017-07-27 DIAGNOSIS — Z7983 Long term (current) use of bisphosphonates: Secondary | ICD-10-CM | POA: Diagnosis not present

## 2017-07-27 DIAGNOSIS — F0280 Dementia in other diseases classified elsewhere without behavioral disturbance: Secondary | ICD-10-CM | POA: Diagnosis present

## 2017-07-27 DIAGNOSIS — Z789 Other specified health status: Secondary | ICD-10-CM

## 2017-07-27 DIAGNOSIS — N179 Acute kidney failure, unspecified: Secondary | ICD-10-CM

## 2017-07-27 DIAGNOSIS — K7460 Unspecified cirrhosis of liver: Secondary | ICD-10-CM | POA: Diagnosis not present

## 2017-07-27 DIAGNOSIS — K7031 Alcoholic cirrhosis of liver with ascites: Secondary | ICD-10-CM | POA: Diagnosis present

## 2017-07-27 DIAGNOSIS — Z9989 Dependence on other enabling machines and devices: Secondary | ICD-10-CM

## 2017-07-27 DIAGNOSIS — Z8619 Personal history of other infectious and parasitic diseases: Secondary | ICD-10-CM | POA: Diagnosis not present

## 2017-07-27 DIAGNOSIS — R0902 Hypoxemia: Secondary | ICD-10-CM | POA: Diagnosis not present

## 2017-07-27 DIAGNOSIS — R0600 Dyspnea, unspecified: Secondary | ICD-10-CM | POA: Diagnosis not present

## 2017-07-27 DIAGNOSIS — D72829 Elevated white blood cell count, unspecified: Secondary | ICD-10-CM | POA: Diagnosis not present

## 2017-07-27 DIAGNOSIS — E038 Other specified hypothyroidism: Secondary | ICD-10-CM

## 2017-07-27 DIAGNOSIS — B1710 Acute hepatitis C without hepatic coma: Secondary | ICD-10-CM | POA: Diagnosis not present

## 2017-07-27 DIAGNOSIS — K7469 Other cirrhosis of liver: Secondary | ICD-10-CM | POA: Diagnosis not present

## 2017-07-27 DIAGNOSIS — K766 Portal hypertension: Secondary | ICD-10-CM

## 2017-07-27 DIAGNOSIS — S069X9S Unspecified intracranial injury with loss of consciousness of unspecified duration, sequela: Secondary | ICD-10-CM

## 2017-07-27 DIAGNOSIS — R0609 Other forms of dyspnea: Secondary | ICD-10-CM | POA: Diagnosis not present

## 2017-07-27 DIAGNOSIS — J918 Pleural effusion in other conditions classified elsewhere: Secondary | ICD-10-CM | POA: Diagnosis not present

## 2017-07-27 DIAGNOSIS — J188 Other pneumonia, unspecified organism: Secondary | ICD-10-CM | POA: Diagnosis not present

## 2017-07-27 DIAGNOSIS — I340 Nonrheumatic mitral (valve) insufficiency: Secondary | ICD-10-CM | POA: Diagnosis not present

## 2017-07-27 DIAGNOSIS — R918 Other nonspecific abnormal finding of lung field: Secondary | ICD-10-CM | POA: Diagnosis not present

## 2017-07-27 DIAGNOSIS — I361 Nonrheumatic tricuspid (valve) insufficiency: Secondary | ICD-10-CM | POA: Diagnosis not present

## 2017-07-27 DIAGNOSIS — M810 Age-related osteoporosis without current pathological fracture: Secondary | ICD-10-CM | POA: Diagnosis not present

## 2017-07-27 DIAGNOSIS — I499 Cardiac arrhythmia, unspecified: Secondary | ICD-10-CM | POA: Diagnosis not present

## 2017-07-27 DIAGNOSIS — R05 Cough: Secondary | ICD-10-CM | POA: Diagnosis not present

## 2017-07-27 DIAGNOSIS — E872 Acidosis: Secondary | ICD-10-CM | POA: Diagnosis not present

## 2017-07-27 DIAGNOSIS — I313 Pericardial effusion (noninflammatory): Secondary | ICD-10-CM | POA: Diagnosis not present

## 2017-07-27 DIAGNOSIS — G4733 Obstructive sleep apnea (adult) (pediatric): Secondary | ICD-10-CM | POA: Diagnosis not present

## 2017-07-27 DIAGNOSIS — G9341 Metabolic encephalopathy: Secondary | ICD-10-CM | POA: Diagnosis not present

## 2017-07-27 DIAGNOSIS — N189 Chronic kidney disease, unspecified: Secondary | ICD-10-CM

## 2017-07-27 DIAGNOSIS — Z6837 Body mass index (BMI) 37.0-37.9, adult: Secondary | ICD-10-CM | POA: Diagnosis not present

## 2017-07-27 DIAGNOSIS — R4182 Altered mental status, unspecified: Secondary | ICD-10-CM | POA: Diagnosis present

## 2017-07-27 DIAGNOSIS — Z515 Encounter for palliative care: Secondary | ICD-10-CM | POA: Diagnosis not present

## 2017-07-27 DIAGNOSIS — I10 Essential (primary) hypertension: Secondary | ICD-10-CM | POA: Diagnosis not present

## 2017-07-27 DIAGNOSIS — R188 Other ascites: Secondary | ICD-10-CM | POA: Diagnosis not present

## 2017-07-27 DIAGNOSIS — G9389 Other specified disorders of brain: Secondary | ICD-10-CM | POA: Diagnosis not present

## 2017-07-27 DIAGNOSIS — D696 Thrombocytopenia, unspecified: Secondary | ICD-10-CM | POA: Diagnosis not present

## 2017-07-27 DIAGNOSIS — N183 Chronic kidney disease, stage 3 (moderate): Secondary | ICD-10-CM | POA: Diagnosis not present

## 2017-07-27 DIAGNOSIS — J90 Pleural effusion, not elsewhere classified: Secondary | ICD-10-CM | POA: Diagnosis not present

## 2017-07-27 DIAGNOSIS — E876 Hypokalemia: Secondary | ICD-10-CM | POA: Diagnosis not present

## 2017-07-27 DIAGNOSIS — I129 Hypertensive chronic kidney disease with stage 1 through stage 4 chronic kidney disease, or unspecified chronic kidney disease: Secondary | ICD-10-CM | POA: Diagnosis not present

## 2017-07-27 DIAGNOSIS — F17210 Nicotine dependence, cigarettes, uncomplicated: Secondary | ICD-10-CM | POA: Diagnosis not present

## 2017-07-27 DIAGNOSIS — Z66 Do not resuscitate: Secondary | ICD-10-CM | POA: Diagnosis not present

## 2017-07-27 DIAGNOSIS — W1830XA Fall on same level, unspecified, initial encounter: Secondary | ICD-10-CM | POA: Diagnosis not present

## 2017-07-27 DIAGNOSIS — E039 Hypothyroidism, unspecified: Secondary | ICD-10-CM | POA: Diagnosis not present

## 2017-07-27 DIAGNOSIS — Z4682 Encounter for fitting and adjustment of non-vascular catheter: Secondary | ICD-10-CM | POA: Diagnosis not present

## 2017-07-27 DIAGNOSIS — D689 Coagulation defect, unspecified: Secondary | ICD-10-CM | POA: Diagnosis not present

## 2017-07-27 DIAGNOSIS — I4581 Long QT syndrome: Secondary | ICD-10-CM | POA: Diagnosis not present

## 2017-07-27 DIAGNOSIS — I8500 Esophageal varices without bleeding: Secondary | ICD-10-CM | POA: Diagnosis not present

## 2017-07-27 DIAGNOSIS — D539 Nutritional anemia, unspecified: Secondary | ICD-10-CM | POA: Diagnosis not present

## 2017-07-27 DIAGNOSIS — J948 Other specified pleural conditions: Secondary | ICD-10-CM | POA: Diagnosis not present

## 2017-07-27 DIAGNOSIS — J9601 Acute respiratory failure with hypoxia: Secondary | ICD-10-CM | POA: Diagnosis not present

## 2017-07-27 DIAGNOSIS — F329 Major depressive disorder, single episode, unspecified: Secondary | ICD-10-CM | POA: Diagnosis not present

## 2017-07-27 DIAGNOSIS — J811 Chronic pulmonary edema: Secondary | ICD-10-CM | POA: Diagnosis not present

## 2017-07-27 DIAGNOSIS — Z452 Encounter for adjustment and management of vascular access device: Secondary | ICD-10-CM | POA: Diagnosis not present

## 2017-07-27 DIAGNOSIS — F4001 Agoraphobia with panic disorder: Secondary | ICD-10-CM | POA: Diagnosis not present

## 2017-07-27 DIAGNOSIS — R0602 Shortness of breath: Secondary | ICD-10-CM | POA: Diagnosis not present

## 2017-07-27 DIAGNOSIS — J690 Pneumonitis due to inhalation of food and vomit: Secondary | ICD-10-CM | POA: Diagnosis not present

## 2017-07-27 DIAGNOSIS — A419 Sepsis, unspecified organism: Secondary | ICD-10-CM | POA: Diagnosis not present

## 2017-07-27 DIAGNOSIS — K7201 Acute and subacute hepatic failure with coma: Secondary | ICD-10-CM | POA: Diagnosis not present

## 2017-07-27 DIAGNOSIS — J9691 Respiratory failure, unspecified with hypoxia: Secondary | ICD-10-CM | POA: Diagnosis not present

## 2017-07-27 DIAGNOSIS — K7290 Hepatic failure, unspecified without coma: Secondary | ICD-10-CM | POA: Diagnosis not present

## 2017-07-27 LAB — RAPID URINE DRUG SCREEN, HOSP PERFORMED
Amphetamines: NOT DETECTED
BARBITURATES: NOT DETECTED
BENZODIAZEPINES: NOT DETECTED
COCAINE: NOT DETECTED
Opiates: NOT DETECTED
TETRAHYDROCANNABINOL: NOT DETECTED

## 2017-07-27 LAB — RETICULOCYTES
RBC.: 2.28 MIL/uL — ABNORMAL LOW (ref 3.87–5.11)
RETIC COUNT ABSOLUTE: 139.1 10*3/uL (ref 19.0–186.0)
Retic Ct Pct: 6.1 % — ABNORMAL HIGH (ref 0.4–3.1)

## 2017-07-27 LAB — I-STAT ARTERIAL BLOOD GAS, ED
Acid-base deficit: 5 mmol/L — ABNORMAL HIGH (ref 0.0–2.0)
Bicarbonate: 18.9 mmol/L — ABNORMAL LOW (ref 20.0–28.0)
O2 Saturation: 97 %
PCO2 ART: 29 mmHg — AB (ref 32.0–48.0)
PO2 ART: 87 mmHg (ref 83.0–108.0)
Patient temperature: 98.6
TCO2: 20 mmol/L — ABNORMAL LOW (ref 22–32)
pH, Arterial: 7.422 (ref 7.350–7.450)

## 2017-07-27 LAB — CBC
HEMATOCRIT: 25.1 % — AB (ref 36.0–46.0)
Hemoglobin: 8.6 g/dL — ABNORMAL LOW (ref 12.0–15.0)
MCH: 37.6 pg — ABNORMAL HIGH (ref 26.0–34.0)
MCHC: 34.3 g/dL (ref 30.0–36.0)
MCV: 109.6 fL — AB (ref 78.0–100.0)
PLATELETS: 268 10*3/uL (ref 150–400)
RBC: 2.29 MIL/uL — AB (ref 3.87–5.11)
RDW: 15.3 % (ref 11.5–15.5)
WBC: 12.8 10*3/uL — AB (ref 4.0–10.5)

## 2017-07-27 LAB — TROPONIN I: Troponin I: <0.03 ng/mL (ref <0.03)

## 2017-07-27 LAB — TSH: TSH: 1.003 u[IU]/mL (ref 0.350–4.500)

## 2017-07-27 LAB — CBG MONITORING, ED: Glucose-Capillary: 110 mg/dL — ABNORMAL HIGH (ref 65–99)

## 2017-07-27 MED ORDER — LEVOTHYROXINE SODIUM 100 MCG IV SOLR: 200.0000 ug | Freq: Every day | INTRAVENOUS | Status: DC

## 2017-07-27 MED ORDER — PANTOPRAZOLE SODIUM 40 MG PO TBEC: 40.0000 mg | DELAYED_RELEASE_TABLET | Freq: Every day | ORAL | Status: DC

## 2017-07-27 MED ORDER — LEVOTHYROXINE SODIUM 100 MCG IV SOLR
100.0000 ug | Freq: Every day | INTRAVENOUS | Status: DC
  Administered 2017-07-28: 100 ug via INTRAVENOUS
  Filled 2017-07-27: qty 5

## 2017-07-27 MED ORDER — SODIUM CHLORIDE 0.9 % IV BOLUS (SEPSIS)
1000.0000 mL | Freq: Once | INTRAVENOUS | Status: AC
  Administered 2017-07-27: 1000 mL via INTRAVENOUS

## 2017-07-27 MED ORDER — PANTOPRAZOLE SODIUM 40 MG IV SOLR
40.0000 mg | Freq: Every day | INTRAVENOUS | Status: DC
  Administered 2017-07-27 – 2017-07-28 (×2): 40 mg via INTRAVENOUS
  Filled 2017-07-27 (×2): qty 40

## 2017-07-27 MED ORDER — RIFAXIMIN 550 MG PO TABS: 550.0000 mg | ORAL_TABLET | Freq: Two times a day (BID) | ORAL | Status: DC

## 2017-07-27 MED ORDER — LACTULOSE ENEMA
300.0000 mL | Freq: Every day | ORAL | Status: DC
  Administered 2017-07-27: 300 mL via RECTAL
  Filled 2017-07-27 (×2): qty 300

## 2017-07-27 MED ORDER — PROPRANOLOL HCL 20 MG PO TABS: 20.0000 mg | ORAL_TABLET | Freq: Two times a day (BID) | ORAL | Status: DC

## 2017-07-27 MED ORDER — ESCITALOPRAM OXALATE 10 MG PO TABS: 20.0000 mg | ORAL_TABLET | Freq: Every day | ORAL | Status: DC

## 2017-07-27 MED ORDER — ENOXAPARIN SODIUM 40 MG/0.4ML ~~LOC~~ SOLN
40.0000 mg | SUBCUTANEOUS | Status: DC
  Administered 2017-07-28: 40 mg via SUBCUTANEOUS
  Filled 2017-07-27 (×2): qty 0.4

## 2017-07-27 MED ORDER — LACTULOSE 10 GM/15ML PO SOLN
20.0000 g | Freq: Three times a day (TID) | ORAL | Status: DC
Start: 2017-07-27 — End: 2017-07-27

## 2017-07-27 MED ORDER — FUROSEMIDE 20 MG PO TABS: 80.0000 mg | ORAL_TABLET | Freq: Every day | ORAL | Status: DC

## 2017-07-27 MED ORDER — LACTULOSE ENEMA
300.0000 mL | Freq: Once | ORAL | Status: AC
  Administered 2017-07-27: 300 mL via RECTAL
  Filled 2017-07-27: qty 300

## 2017-07-27 MED ORDER — MONTELUKAST SODIUM 10 MG PO TABS: 10.0000 mg | ORAL_TABLET | Freq: Every day | ORAL | Status: DC

## 2017-07-27 MED ORDER — LEVOTHYROXINE SODIUM 200 MCG PO TABS
200.0000 ug | ORAL_TABLET | Freq: Every day | ORAL | Status: DC
  Filled 2017-07-27: qty 1

## 2017-07-27 MED ORDER — DEXTROSE 5 % IV SOLN
2.0000 g | INTRAVENOUS | Status: DC
  Administered 2017-07-27 – 2017-07-29 (×3): 2 g via INTRAVENOUS
  Filled 2017-07-27 (×4): qty 2

## 2017-07-27 MED ORDER — LORATADINE 10 MG PO TABS: 10.0000 mg | ORAL_TABLET | Freq: Every day | ORAL | Status: DC

## 2017-07-27 MED ORDER — SPIRONOLACTONE 100 MG PO TABS: 200.0000 mg | ORAL_TABLET | Freq: Every day | ORAL | Status: DC

## 2017-07-27 NOTE — ED Notes (Signed)
Admitting MD paged to [facility] @ [phone] to [phone].

## 2017-07-27 NOTE — ED Notes (Signed)
IR came to transport patient

## 2017-07-27 NOTE — Telephone Encounter (Signed)
Her friend called to let us know she is in the hospital and said she had mentioned needing an x-ray (but friend did not know what for).  He was just calling as fyi, thanks.  Mr. Harvel Ricks (her friend) called from [phone].

## 2017-07-27 NOTE — ED Notes (Signed)
Pt attempting to remove IV. Additional gauze added to cover pt's IV.

## 2017-07-27 NOTE — ED Notes (Signed)
Spoke w/ Larkin Ina in [place] regarding the flexi seal. States that he'll have it sent up STAT. Nikki RN aware.

## 2017-07-27 NOTE — ED Notes (Signed)
Attempted report 

## 2017-07-27 NOTE — ED Notes (Signed)
Admitting MD paged to [facility] RN @ [phone] to [phone] regarding the pt's bed request.

## 2017-07-27 NOTE — ED Notes (Signed)
Flexiseal rectal tube requested from materials to administer lactulose.

## 2017-07-27 NOTE — H&P (Signed)
Attending Brief H&P I have interviewed and examined the patient.  I have discussed the case and verified the key findings with Dr. Juleen China and Dr Garlan Fillers.   I agree with their assessments and plans as documented in their admission note.    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  Suspect Alcohol origin +/- GI bleed    Recommendations - IV fluid hydration and monitor for SCr response back to baseline - Paracentesis for CBC with diff and culture looking for SBP. Start Rocephin if patient becomes hemodynamically unstable prior to getting ascitic fluid PMN count.  - Avoid Benzodiazapines, opiates, tramadol, anticholinergics for now.   - Monitor for withdrawal benzodiazapine/alcohol: Consider reduce benzo dose response given sensitivity of patients with hepatic encephalopathy.  - Check FOBT for evidence of GI bleed in patient with hx esophageal varices and decline in hemoglobin over last couple days. Check reticulocyte count to look for evidence of bleeding (increased retic count). - CPAP - Restart  lactulose and rifaximin.  If unable to take PO because of obtundation then start with lactulose per rectum.

## 2017-07-27 NOTE — H&P (Signed)
[facility] Admission History and Physical Service Pager: [phone]  Patient name: Wendy [name] Medical record number: [id number] Date of birth: 07-15-1961 Age: 56 y.o. Gender: female  Primary Care Provider: Verner Mould, MD Consultants: None Code Status: FULL (unable to confirm on admission, Full code at prior admissions)   Chief Complaint:   AMS w/unwitnessed fall  Assessment and Plan: Wendy [name] is a 56 y.o. female presenting with AMS and an unwitnessed fall . 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 and past alcohol use, Esophageal varices, non-compliance with medications, anxiety, panic disorder, frequent falls, HTN, GERD, and Obesity  Altered Mental Status/unwitnessed fall: Patient was reportedly found on the floor and EMS was called. She arrived to the ED altered and has been unable to give a history since arriving. Patient was able to awaken for only short intervals to touch and verbal commands but was unable to follow any commands or respond verbally. Patient has history of cirrhosis and hepatic encephalopathy with current ammonia level of 99. She also has history of non-compliance with medication and thus is likely not taking her lactulose. Given these factors, hepatic encephalopathy is most likely the cause of her AMS and likely fall. Alternatively, stroke as a potential cause was considered but unlikely as head CT showed no evidence of acute infarction or hemorrhage. No concern for lesions, mass effect or hydrocephalus. UTI could be another cause but less likely as her U/A was wnl, no nitrites, no leukocytes, she is afebrile and no high WBC. Acute intoxication is another consideration but ETOH was <10 and UDS is pending. Thyroid myoxedema coma is also a possibility as patient is hypothyroid and this could be a possibility with not taking her levothyroxine but overall less likely given her ammonia level.  It would also not explain her distended abdomen. Patient also given Ativan 2 mg prior to evaluation which may be contributing to mental status.  - Admit to stepdown, attending Dr. McDiarmid - ABGs to ensure no acidosis/alkalosis contributing to AMS - TSH to rule out acute thyroid myxedema coma contributing to process; last TSH >1 yr ago but was WNL  - repeat CMP and CBC in am - UDS pending - Cont home Lactulose 20g TID; titrate as needed to produce 2-3 soft bowel movements per day  - Cont home rifaximin 550mg  BID -check troponin  -neuro checks q1 hour  -NPO pending nurse swallow evaluation   Cirrhosis: Secondary to Hepatitis C and prior alcohol use. EMR indicated complete Hep C treatment in 2016. No recent alcohol use per EMR. INR 1.69. Ascites present on exam. Does not to appear to have recent paracentesis done. Ultrasound of abdomen performed 11/3 showed minimal ascites. Low suspicion for hospital course being complicated by SBP given lack of fevers, no leukocytosis, and patient does not withdrawal from palpation to abdomen.  -limited ultrasound abdomen to evaluate ascites volume  -continue spironolactone and lasix   AKI: Patient has acute elevation of Scr at 1.80. Baseline appears to be around 1.08 back in 06/2017. GFR of 30 so this is an acute on chronic disease process. Likely prerenal as patient could be third-spacing her fluid explaining her pitting edema in her extremities and her distended abdomen. Patient has history of non-compliance with medication so potentially she has not been taking her diuretics either.UA is unremarkable. Could also consider hepatorenal syndrome.  - CMP in am - Cont home meds Lasix 40mg  daily, Spironolactone 200mg  daily - Consider giving IVFs to  rehydration if no improvement with diuresis  HTN: BP since admission has been normotensive. - Cont to monitor -decrease propranolol dose to 20 mg BID from 60 mg BID   Hypothyroid: Patient has history of  hypothyroidism. Unlikely that she has myxedema coma but worth checking due to history of non-compliance with medication. - Cont levothyroxine 136mcg daily before breakfast - TSH pending  Anxiety/Panic Diorder: Per notes from clinic patient has severe anxiety and agoraphobia. It is difficult for her to leave her house and she often misses necessary doctors appointment due to not being able to cope with the anxiety. - Cont home Lexipro 20mg  daily - Holding Atartax and clonopin for now until AMS resolves  FEN/GI: NPO until resolution of AMS, Protonix Prophylaxis: Heparin  Disposition: admit to stepdown  History of Present Illness:  Wendy [name] is a 56 y.o. female presenting with altered mental status with an unwitnessed fall. She arrived to the ED altered and was found in this condition by EMS. She was unable to give any history upon arrival to the ED and became agitated. She was given 2mg  of Ativan in the ED and since has been solemnent. She is unable to stay awake for any appreciable amount of time but can awaken to touch and verbal commands. She is unable to answer any questions or give any indication that she understands what she is being asked. She is currently on 2L .  Review Of Systems: Per HPI with the following additions: unable to perform due to patient being altered.  Review of Systems  Musculoskeletal: Positive for myalgias.    Patient Active Problem List   Diagnosis Date Noted  . Hepatic encephalopathy ([place]) 06/26/2017  . Tremor 01/20/2017  . Fluctuation of weight 08/27/2016  . Medically complex patient 08/27/2016  . Paresthesia of lower extremity 07/23/2016  . Generalized weakness 06/18/2016  . Eye drainage 04/29/2016  . Nasal congestion 04/29/2016  . Routine health maintenance 04/29/2016  . Nocturia 02/27/2016  . Morbid obesity ([place]) 02/07/2016  . 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 Pre-diabetes 11/01/2015  . TBI  (traumatic brain injury) ([place]) 10/25/2015  . OSA on CPAP 10/25/2015  . Paraesophageal hiatal hernia 10/17/2015  . Chronic low back pain without sciatica 06/28/2015  . OSA (obstructive sleep apnea) 06/12/2015  . Excessive daytime sleepiness 04/11/2015  . Episodic cluster headache, not intractable 04/11/2015  . Obesity, morbid ([place]) 04/11/2015  . Insomnia 12/27/2014  . Hypothyroidism 09/04/2014  . Cognitive and behavioral changes 07/17/2014  . History of traumatic brain injury 07/10/2014  . Recurrent falls 06/29/2014  . Osteopenia 04/04/2014  . Esophageal varices in cirrhosis - trace 03/19/2014  . Anemia 02/01/2014  . Chronic kidney disease, unspecified ([place]) 02/01/2014  . Chronic back pain 02/01/2014  . Stricture and stenosis of esophagus 01/26/2014  . Fall at home 01/24/2014  . Near syncope 01/12/2014  . Compression fracture 09/21/2013  . Atypical chest pain 07/26/2013  . Ascites 07/19/2013  . Dyspnea 02/03/2013  . Tobacco abuse 10/22/2012  . Allergic conjunctivitis 06/19/2012  . Increased frequency of urination 02/05/2012  . Thinning hair 12/25/2011  . Lumbar spondylosis 12/24/2011  . Obesity (BMI 35.0-39.9 without comorbidity) 12/08/2011  . Right hip pain 12/01/2011  . Chronic hepatitis C - genotype 1A 10/06/2011  . GERD (gastroesophageal reflux disease) 10/06/2011  . Hepatic encephalopathy syndrome ([place]) 07/28/2011  . Back pain 04/07/2011  . 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 THROMBOCYTOPENIA 10/21/2006  . Depression, major, recurrent, moderate (  [place]) 10/21/2006  . HYPERTENSION, BENIGN SYSTEMIC 10/21/2006  . Hepatic cirrhosis ([place]) 10/21/2006  . Generalized pruritus 10/21/2006    Past Medical History: Past Medical History:  Diagnosis Date  . Allergic rhinitis   . Anxiety and depression   . Arthritis   . Blood transfusion   . Cataract    MILD  . Clotting disorder ([place])    prolonged clotting time due to liver disease  . Compression fracture 09/21/2013   . 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 Esophageal stricture    esophageal dysmotility and chronic dysphagia as well  . Esophageal varices ([place])   . GERD (gastroesophageal reflux disease)   . Hepatic cirrhosis due to chronic hepatitis C infection ([place])    ETOH causative as well  . Hepatic encephalopathy syndrome ([place])   . Hiatal hernia   . History of alcohol abuse   . History of hip fracture   . HTN (hypertension)   . Osteoporosis   . 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 Paraesophageal hernia   . Paraesophageal hiatal hernia 10/17/2015  . 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 Ulcer    2007    Past Surgical History: Past Surgical History:  Procedure Laterality Date  . BURR HOLE FOR SUBDURAL HEMATOMA  2004  . COLONOSCOPY  08/2012   moderatel left colon tics  . ESOPHAGOGASTRODUODENOSCOPY (EGD) WITH PROPOFOL N/A 01/26/2014   Procedure: ESOPHAGOGASTRODUODENOSCOPY (EGD) WITH PROPOFOL;  Surgeon: Lafayette Dragon, MD;  Location[facility]COPY;  Service: Endoscopy;  Laterality: N/A;  . KYPHOPLASTY Bilateral 09/21/2013   Procedure: T12 - L1 KYPHOPLASTY;  Surgeon: Melina Schools, MD;  Location: [place];  Service: Orthopedics;  Laterality: Bilateral;  . NASAL SINUS SURGERY    . ORIF HIP FRACTURE  2010   right x2  . SPLENECTOMY     age 23  . UPPER GASTROINTESTINAL ENDOSCOPY  08/05/2007   esophageal ring, hiatal hernia, portal gastropathy  . UPPER GASTROINTESTINAL ENDOSCOPY  10/14/2011    Social History: Social History   Tobacco Use  . Smoking status: Current Every Day Smoker    Packs/day: 0.25    Years: 10.00    Pack years: 2.50    Types: Cigarettes  . Smokeless tobacco: Never Used  . Tobacco comment: reduce # of cigarettes  Substance Use Topics  . Alcohol use: No    Alcohol/week: 0.0 oz    Comment: history of attending AA  . Drug use: No   Additional social history: None Please  also refer to relevant sections of EMR.  Family History: Family History  Problem Relation Age of Onset  . Heart disease Mother   . Anxiety disorder Mother   . Other Daughter        chromosome abnormalit  . Other Daughter        micropthalmia  . Cancer Maternal Grandmother   . Kidney failure Sister   . Colon cancer Neg Hx   . Esophageal cancer Neg Hx   . Rectal cancer Neg Hx   . Stomach cancer Neg Hx     Allergies and Medications: Allergies  Allergen Reactions  . Codeine Phosphate Itching  . Codeine Rash   No current facility-administered medications on file prior to encounter.    Current Outpatient Medications on File Prior to Encounter  Medication Sig Dispense Refill  . acetaminophen (TYLENOL 8 HOUR) 650 MG CR tablet Take 1 tablet (650 mg total) by mouth  every 8 (eight) hours as needed for pain. 30 tablet 0  . azithromycin (ZITHROMAX) 250 MG tablet Take 1 tablet (250 mg total) daily by mouth. 3 each 0  . bisacodyl ([employer] GENTLE LAXATIVE) 5 MG EC tablet TAKE 1 TABLET (5 MG TOTAL) BY MOUTH DAILY. (Patient not taking: Reported on 06/26/2017) 30 tablet 2  . cefdinir (OMNICEF) 300 MG capsule Take 1 capsule (300 mg total) 2 (two) times daily by mouth. 10 capsule 0  . CHANTIX 0.5 MG tablet TAKE 1 TABLET BY MOUTH TWICE A DAY 28 tablet 0  . Cholecalciferol (VITAMIN D3) 2000 UNITS capsule Take 2,000 Units by mouth daily.    . clonazePAM (KLONOPIN) 1 MG tablet Take 0.5-1 tablets (0.5-1 mg total) by mouth 2 (two) times daily as needed for anxiety. 30 tablet 2  . cycloSPORINE (RESTASIS) 0.05 % ophthalmic emulsion Place 1 drop into both eyes 2 (two) times daily. 0.4 mL 0  . docusate sodium (COLACE) 100 MG capsule TAKE 1 CAPSULE BY MOUTH TWICE A DAY 60 capsule 2  . escitalopram (LEXAPRO) 20 MG tablet Take 0.5 tablets (10 mg total) daily by mouth. Stop the 10mg  tablets. Dose increased to 20mg  today 30 tablet 3  . fluticasone (FLONASE) 50 MCG/ACT nasal spray Place 2 sprays into both nostrils  daily. 16 g 6  . furosemide (LASIX) 40 MG tablet Take 2 tablets (80 mg total) by mouth daily. 60 tablet 3  . gabapentin (NEURONTIN) 300 MG capsule Take 1 capsule (300 mg total) by mouth 3 (three) times daily. 90 capsule 3  . hydrOXYzine (ATARAX/VISTARIL) 25 MG tablet TAKE 1 TO 2 TABLETS BY MOUTH TWICE A DAY AS NEEDED FOR ITCHING 90 tablet 0  . lactulose (CHRONULAC) 10 GM/15ML solution Take 30 mLs (20 g total) 3 (three) times daily by mouth. Take 2 tbsp by mouth 2 times a day 1892 mL 6  . levothyroxine (SYNTHROID, LEVOTHROID) 200 MCG tablet Take 1 tablet (200 mcg total) by mouth daily before breakfast. 90 tablet 3  . loratadine (CLARITIN) 10 MG tablet TAKE 1 TABLET BY MOUTH EVERY DAY 30 tablet 6  . montelukast (SINGULAIR) 10 MG tablet TAKE 1 TABLET (10 MG TOTAL) BY MOUTH DAILY. 30 tablet 2  . Multiple Vitamins-Minerals (CENTRUM SILVER ULTRA WOMENS PO) Take by mouth.    Marland Kitchen omeprazole (PRILOSEC) 20 MG capsule TAKE 1 CAPSULE (20 MG TOTAL) BY MOUTH DAILY. 30 capsule 5  . propranolol (INDERAL) 60 MG tablet TAKE 1 TABLET (60 MG TOTAL) BY MOUTH 2 (TWO) TIMES DAILY. 60 tablet 1  . QUEtiapine (SEROQUEL) 25 MG tablet Take 1 tablet (25 mg total) by mouth at bedtime. 30 tablet 3  . ranitidine (ZANTAC) 300 MG tablet TAKE 1 TABLET (300 MG TOTAL) BY MOUTH AT BEDTIME. 30 tablet 3  . rifaximin (XIFAXAN) 550 MG TABS tablet Take 1 tablet (550 mg total) by mouth 2 (two) times daily. 60 tablet 2  . spironolactone (ALDACTONE) 100 MG tablet Take 2 tablets (200 mg total) by mouth daily. 60 tablet 4  . spironolactone (ALDACTONE) 100 MG tablet TAKE 1.5 TABLETS (150 MG TOTAL) BY MOUTH DAILY. 45 tablet 5  . traMADol (ULTRAM) 50 MG tablet Take 1 tablet (50 mg total) by mouth 2 (two) times daily. 60 tablet 2  . traZODone (DESYREL) 50 MG tablet TAKE 0.5 TABLETS (25 MG TOTAL) BY MOUTH AT BEDTIME AS NEEDED FOR SLEEP. 30 tablet 0    Objective: BP 123/74   Pulse 90   Temp (!) 97.1 F (36.2 C) (  Rectal)   Resp 19   Ht 5\' 6"   (1.676 m)   Wt 203 lb (92.1 kg)   SpO2 92%   BMI 32.77 kg/m  Exam: Gen: Alert and Oriented x 0 HEENT: Normocephalic, atraumatic, PERRLA, non-erythematous pharyngeal mucosa, no exudates CV: RRR, no murmurs, normal S1, S2 split, faint dorsalis pedis bilaterally Resp: CTAB anteriorly, no wheezing, rales, or rhonchi, comfortable work of breathing, [place] in place  Abd: swollen obese abdomen, distended, non-tender, soft, +bs in all four quadrants, questionable fluid wave Ext: no clubbing or cyanosis, +2 pitting edema bilaterally in lower extremities, moves extremities spontaneously  Neuro: not able to assess due to patient being obtunded, withdraws from pain, responsive briefly to voice and to tactile stimulation, does not follow commands or verbally respond  Skin: warm, dry, intact, no rashes  Labs and Imaging: CBC BMET  Recent Labs  Lab 07/26/17 2042  WBC 9.4  HGB 8.7*  HCT 25.0*  PLT 297   Recent Labs  Lab 07/26/17 2042  NA 130*  K 4.5  CL 101  CO2 18*  BUN 31*  CREATININE 1.80*  GLUCOSE 141*  CALCIUM 8.6*     12/3 - Ammonia: 99 12/3 - U/A: wnl 12/3 - Ethanol - <10 12/4 - Troponin - pending 12/4 - UDS - pending 12/4 - TSH - pending 12/4 - ABG -pending  12/3 - CT Head IMPRESSION: Stable appearance of the brain with large area of encephalomalacia in the right frontal lobe and small area of encephalomalacia in the posterior left parietal lobe.  No acute findings.  12/3 - Port CXR:  IMPRESSION: 1. Small peripheral opacity at the right lung base may represent focal pleural thickening, small fluid loculation or atelectasis. Mild interstitial edema with low lung volumes.  Nuala Alpha, DO 07/27/2017, 12:48 AM PGY-1, [place] Intern pager: [phone], text pages welcome  Upper Level Addendum:  I have seen and evaluated this patient along with Dr. Garlan Fillers and reviewed the above note, making necessary revisions in green.   Phill Myron, D.O. 07/27/2017, 1:40 AM PGY-3, [place]

## 2017-07-27 NOTE — Progress Notes (Signed)
FPTS Interim Progress Note  S: Patient still altered this morning.  Unable to tell me her name and does not know where she is.  Does admit to pain and abdominal region.  When I entered the ED room she is chewing on her oxygen monitor.  Pleasantly confused.  O: BP (!) 88/50   Pulse 89   Temp (!) 97.1 F (36.2 C) (Rectal)   Resp 13   Ht 5\' 6"  (1.676 m)   Wt 203 lb (92.1 kg)   SpO2 98%   BMI 32.77 kg/m   General: NAD, confused Eyes: PERRL, no scleral icterus, no conjunctival pallor or injection  ENTM: Dry mucous membranes, no pharyngeal erythema or exudate Neck: Supple, no LAD Cardiovascular: RRR, no m/r/g, 2+ pitting LE edema to the knee Respiratory: CTA BL, normal work of breathing Gastrointestinal: soft, diffusely tender, notably distended, normoactive BS MSK: moves 4 extremities equally Derm: no rashes appreciated Neuro: CN II-XII grossly intact Psych: AOx3, appropriate affect   A/P:  Abdominal US with mod-large amount of ascites. AMS likely due to hepatic encephalopathy, and will remain NPO while altered and SLP consulted. IR consulted for paracentesis with studies. Patient started on Rocephin for concern for SBP. Will consider adding IV albumin. Lactulose given rectally.   Wendy [name], [place], DO 07/27/2017, 8:14 AM PGY-1, [place] Medicine Service pager [phone]

## 2017-07-27 NOTE — ED Notes (Signed)
Patient denies pain and is resting comfortably.  

## 2017-07-27 NOTE — ED Notes (Signed)
Pt taken to IR but had pulled her armband off.  Pt unable to identify herself to IR nurses.  Pt was brought back to the ED armband placed back on pt.  IR advises that it will be tomorrow before procedure.

## 2017-07-27 NOTE — ED Notes (Signed)
Patient transported to Ultrasound 

## 2017-07-28 ENCOUNTER — Encounter (HOSPITAL_COMMUNITY): Payer: Self-pay | Admitting: General Surgery

## 2017-07-28 ENCOUNTER — Inpatient Hospital Stay (HOSPITAL_COMMUNITY): Payer: Medicare Other

## 2017-07-28 HISTORY — PX: IR PARACENTESIS: IMG2679

## 2017-07-28 LAB — BODY FLUID CELL COUNT WITH DIFFERENTIAL
EOS FL: 1 %
Lymphs, Fluid: 34 %
Monocyte-Macrophage-Serous Fluid: 60 % (ref 50–90)
NEUTROPHIL FLUID: 6 % (ref 0–25)
WBC FLUID: 147 uL (ref 0–1000)

## 2017-07-28 LAB — COMPREHENSIVE METABOLIC PANEL
ALBUMIN: 2 g/dL — AB (ref 3.5–5.0)
ALT: 32 U/L (ref 14–54)
ANION GAP: 12 (ref 5–15)
AST: 79 U/L — AB (ref 15–41)
Alkaline Phosphatase: 107 U/L (ref 38–126)
BILIRUBIN TOTAL: 2 mg/dL — AB (ref 0.3–1.2)
BUN: 28 mg/dL — AB (ref 6–20)
CHLORIDE: 105 mmol/L (ref 101–111)
CO2: 18 mmol/L — ABNORMAL LOW (ref 22–32)
Calcium: 8.6 mg/dL — ABNORMAL LOW (ref 8.9–10.3)
Creatinine, Ser: 1.58 mg/dL — ABNORMAL HIGH (ref 0.44–1.00)
GFR calc Af Amer: 41 mL/min — ABNORMAL LOW (ref >60)
GFR, EST NON AFRICAN AMERICAN: 35 mL/min — AB (ref >60)
Glucose, Bld: 106 mg/dL — ABNORMAL HIGH (ref 65–99)
POTASSIUM: 4.3 mmol/L (ref 3.5–5.1)
Sodium: 135 mmol/L (ref 135–145)
TOTAL PROTEIN: 6.8 g/dL (ref 6.5–8.1)

## 2017-07-28 LAB — POCT I-STAT 3, ART BLOOD GAS (G3+)
Acid-base deficit: 5 mmol/L — ABNORMAL HIGH (ref 0.0–2.0)
Bicarbonate: 18.4 mmol/L — ABNORMAL LOW (ref 20.0–28.0)
O2 Saturation: 96 %
PCO2 ART: 27 mmHg — AB (ref 32.0–48.0)
PH ART: 7.442 (ref 7.350–7.450)
TCO2: 19 mmol/L — AB (ref 22–32)
pO2, Arterial: 78 mmHg — ABNORMAL LOW (ref 83.0–108.0)

## 2017-07-28 LAB — CBC
HEMATOCRIT: 25.9 % — AB (ref 36.0–46.0)
HEMOGLOBIN: 8.6 g/dL — AB (ref 12.0–15.0)
MCH: 36.9 pg — ABNORMAL HIGH (ref 26.0–34.0)
MCHC: 33.2 g/dL (ref 30.0–36.0)
MCV: 111.2 fL — AB (ref 78.0–100.0)
Platelets: 280 10*3/uL (ref 150–400)
RBC: 2.33 MIL/uL — AB (ref 3.87–5.11)
RDW: 15.7 % — AB (ref 11.5–15.5)
WBC: 9.1 10*3/uL (ref 4.0–10.5)

## 2017-07-28 LAB — GRAM STAIN

## 2017-07-28 LAB — AMYLASE, PLEURAL OR PERITONEAL FLUID: AMYLASE FL: 10 U/L

## 2017-07-28 LAB — LACTATE DEHYDROGENASE, PLEURAL OR PERITONEAL FLUID: LD, Fluid: 15 U/L (ref 3–23)

## 2017-07-28 LAB — OCCULT BLOOD X 1 CARD TO LAB, STOOL: FECAL OCCULT BLD: NEGATIVE

## 2017-07-28 LAB — ALBUMIN, PLEURAL OR PERITONEAL FLUID

## 2017-07-28 LAB — IRON AND TIBC
Iron: 54 ug/dL (ref 28–170)
Saturation Ratios: 20 % (ref 10.4–31.8)
TIBC: 276 ug/dL (ref 250–450)
UIBC: 222 ug/dL

## 2017-07-28 LAB — MRSA PCR SCREENING: MRSA BY PCR: NEGATIVE

## 2017-07-28 LAB — FERRITIN: FERRITIN: 151 ng/mL (ref 11–307)

## 2017-07-28 LAB — PROTEIN, PLEURAL OR PERITONEAL FLUID: Total protein, fluid: <3 g/dL

## 2017-07-28 LAB — FOLATE: Folate: 30 ng/mL (ref >5.9)

## 2017-07-28 MED ORDER — LIDOCAINE HCL 2 % IJ SOLN
INTRAMUSCULAR | Status: DC | PRN
  Administered 2017-07-28: 10 mL

## 2017-07-28 MED ORDER — SPIRONOLACTONE 25 MG PO TABS
150.0000 mg | ORAL_TABLET | Freq: Every day | ORAL | Status: DC
  Administered 2017-07-28 – 2017-07-30 (×3): 150 mg via ORAL
  Filled 2017-07-28 (×3): qty 6

## 2017-07-28 MED ORDER — LACTULOSE ENEMA: 300.0000 mL | Freq: Two times a day (BID) | ORAL | Status: DC

## 2017-07-28 MED ORDER — FUROSEMIDE 10 MG/ML IJ SOLN
20.0000 mg | Freq: Once | INTRAMUSCULAR | Status: AC
  Administered 2017-07-28: 20 mg via INTRAVENOUS
  Filled 2017-07-28: qty 2

## 2017-07-28 MED ORDER — FUROSEMIDE 80 MG PO TABS
80.0000 mg | ORAL_TABLET | Freq: Every day | ORAL | Status: DC
  Administered 2017-07-29: 80 mg via ORAL
  Filled 2017-07-28: qty 1

## 2017-07-28 MED ORDER — LEVOTHYROXINE SODIUM 100 MCG PO TABS
200.0000 ug | ORAL_TABLET | Freq: Every day | ORAL | Status: DC
  Administered 2017-07-29 – 2017-08-01 (×4): 200 ug via ORAL
  Filled 2017-07-28 (×5): qty 2

## 2017-07-28 MED ORDER — LIDOCAINE 2% (20 MG/ML) 5 ML SYRINGE
INTRAMUSCULAR | Status: AC
  Filled 2017-07-28: qty 10

## 2017-07-28 MED ORDER — LACTULOSE ENEMA
300.0000 mL | Freq: Two times a day (BID) | ORAL | Status: DC
  Administered 2017-07-28: 300 mL via RECTAL
  Filled 2017-07-28: qty 300

## 2017-07-28 MED ORDER — ORAL CARE MOUTH RINSE
15.0000 mL | Freq: Two times a day (BID) | OROMUCOSAL | Status: DC
  Administered 2017-07-28 – 2017-08-01 (×8): 15 mL via OROMUCOSAL

## 2017-07-28 MED ORDER — ALBUTEROL SULFATE (2.5 MG/3ML) 0.083% IN NEBU
2.5000 mg | INHALATION_SOLUTION | RESPIRATORY_TRACT | Status: DC | PRN
  Administered 2017-07-28: 2.5 mg via RESPIRATORY_TRACT
  Filled 2017-07-28: qty 3

## 2017-07-28 MED ORDER — IPRATROPIUM-ALBUTEROL 0.5-2.5 (3) MG/3ML IN SOLN
RESPIRATORY_TRACT | Status: AC
  Administered 2017-07-28: 3 mL via RESPIRATORY_TRACT
  Filled 2017-07-28: qty 3

## 2017-07-28 MED ORDER — IPRATROPIUM-ALBUTEROL 0.5-2.5 (3) MG/3ML IN SOLN
3.0000 mL | Freq: Four times a day (QID) | RESPIRATORY_TRACT | Status: DC
  Administered 2017-07-28 – 2017-07-30 (×7): 3 mL via RESPIRATORY_TRACT
  Filled 2017-07-28 (×9): qty 3

## 2017-07-28 MED ORDER — LACTULOSE 10 GM/15ML PO SOLN
30.0000 g | Freq: Two times a day (BID) | ORAL | Status: DC
  Administered 2017-07-28 – 2017-07-29 (×4): 30 g via ORAL
  Filled 2017-07-28 (×4): qty 45

## 2017-07-28 MED ORDER — IOPAMIDOL (ISOVUE-300) INJECTION 61%
INTRAVENOUS | Status: AC
Start: 2017-07-28 — End: 2017-07-28
  Administered 2017-07-28: 75 mL
  Filled 2017-07-28: qty 75

## 2017-07-28 MED ORDER — PROCHLORPERAZINE MALEATE 5 MG PO TABS
5.0000 mg | ORAL_TABLET | Freq: Four times a day (QID) | ORAL | Status: DC | PRN
  Administered 2017-07-28 – 2017-07-29 (×3): 5 mg via ORAL
  Filled 2017-07-28 (×5): qty 1

## 2017-07-28 NOTE — Evaluation (Signed)
Clinical/Bedside Swallow Evaluation Patient Details  Name: Wendy [name] MRN: [id number] Date of Birth: 06/29/1961  Today's Date: 07/28/2017 Time: SLP Start Time (ACUTE ONLY): 1500 SLP Stop Time (ACUTE ONLY): 1510 SLP Time Calculation (min) (ACUTE ONLY): 10 min  Past Medical History:  Past Medical History:  Diagnosis Date  . Allergic rhinitis   . Anxiety and depression   . Arthritis   . Blood transfusion   . Cataract    MILD  . Clotting disorder ([place])    prolonged clotting time due to liver disease  . Compression fracture 09/21/2013  . 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 Esophageal stricture    esophageal dysmotility and chronic dysphagia as well  . Esophageal varices ([place])   . GERD (gastroesophageal reflux disease)   . Hepatic cirrhosis due to chronic hepatitis C infection ([place])    ETOH causative as well  . Hepatic encephalopathy syndrome ([place])   . Hiatal hernia   . History of alcohol abuse   . History of hip fracture   . HTN (hypertension)   . Osteoporosis   . 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 Paraesophageal hernia   . Paraesophageal hiatal hernia 10/17/2015  . 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 Ulcer    2007   Past Surgical History:  Past Surgical History:  Procedure Laterality Date  . BURR HOLE FOR SUBDURAL HEMATOMA  2004  . COLONOSCOPY  08/2012   moderatel left colon tics  . ESOPHAGOGASTRODUODENOSCOPY (EGD) WITH PROPOFOL N/A 01/26/2014   Procedure: ESOPHAGOGASTRODUODENOSCOPY (EGD) WITH PROPOFOL;  Surgeon: Lafayette Dragon, MD;  Location[facility] ENDOSCOPY;  Service: Endoscopy;  Laterality: N/A;  . IR PARACENTESIS  07/28/2017  . KYPHOPLASTY Bilateral 09/21/2013   Procedure: T12 - L1 KYPHOPLASTY;  Surgeon: Melina Schools, MD;  Location: [place];  Service: Orthopedics;  Laterality: Bilateral;  . NASAL SINUS SURGERY    . ORIF HIP FRACTURE  2010   right x2  .  SPLENECTOMY     age 54  . UPPER GASTROINTESTINAL ENDOSCOPY  08/05/2007   esophageal ring, hiatal hernia, portal gastropathy  . UPPER GASTROINTESTINAL ENDOSCOPY  10/14/2011   HPI:  Wendy [name] is a 56 y.o. female presenting with AMS and an unwitnessed fall . 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 Initiate regular diet and thin liquids. Will sign off.       Aspiration Risk       Diet Recommendation Regular;Thin liquid   Liquid Administration via: Cup;Straw Medication Administration: Whole meds with liquid      Swallow Study   General HPI: Wendy [name] is a 56 y.o. female presenting with AMS and an unwitnessed fall . 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 and past alcohol use, Esophageal varices, non-compliance with medications, anxiety, panic disorder, frequent falls, HTN, GERD, and Obesity Diet Prior to this Study: NPO Temperature Spikes Noted: No Respiratory Status: Nasal cannula History of Recent Intubation: No Behavior/Cognition: Alert;Cooperative;Pleasant mood Oral Cavity Assessment: Within Functional Limits Oral Care Completed by SLP: No Oral Cavity - Dentition: Adequate natural dentition Vision: Functional for self-feeding Self-Feeding Abilities: Able to feed self Patient Positioning: Upright in bed Baseline Vocal Quality: Normal Volitional Cough: Strong Volitional Swallow:  Able to elicit    Oral/Motor/Sensory Function     Ice Chips     Thin Liquid Thin Liquid: Within functional limits Presentation: Cup;Straw    Nectar Thick Nectar Thick Liquid: Not tested   Honey Thick Honey Thick Liquid: Not tested   Puree Puree: Within functional limits    Solid   GO   Solid: Within functional limits       [facility], MA CCC-SLP [phone]  Wendy [name] 07/28/2017,3:26 PM

## 2017-07-28 NOTE — Progress Notes (Signed)
Pt. C/o s.o.b at the beginning of the shift. Called intern to alert. Pt. Was scheduled for paracentesis, came back still c/o s.o.b. Oxygen was ordered with no relief, lasix was ordered with no relief, lactulose via enema balloon was inserted and patient could not retain. Patient continued with shallow breathing using accessory muscles and skin was pallor. Alerted intern services again and explained the expiratory wheezing, chest x-ray was ordered STAT. ABGs were drawn and I along with Charge Nurse transported patient to x-ray.  Patient has voided on bsc 3xs during shift but unable to have a bowel movement. Patient passed her swallow test with speech therapy and diet was changed to regular. After dinner patient's stomach pain began "unbearable" with her screaming and moaning for help.  Could not get comfortable.

## 2017-07-28 NOTE — Progress Notes (Signed)
SLP Cancellation Note  Patient Details Name: Wendy [name] MRN: [id number] DOB: 1961/03/18   Cancelled treatment:       Reason Eval/Treat Not Completed: Patient at procedure or test/unavailable. Pt heading to paracentesis. Will f/u later in am/pm if pt back.   Herbie Baltimore, [place] CCC-SLP [phone]  Lynann Beaver 07/28/2017, 9:32 AM

## 2017-07-28 NOTE — Progress Notes (Signed)
Family Medicine Teaching Service Daily Progress Note Intern Pager: [phone]  Patient name: Wendy [name] Medical record number: [id number] Date of birth: May 15, 1961 Age: 56 y.o. Gender: female  Primary Care Provider: Verner Mould, MD Consultants: IR Code Status: Full  Pt Overview and Major Events to Date:  Wendy [name] is a 56 y.o. female presenting with AMS and an unwitnessed fall . 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 and past alcohol use, Esophageal varices, non-compliance with medications, anxiety, panic disorder, frequent falls, HTN, GERD, and Obesity  Assessment and Plan:  Altered Mental Status/unwitnessed fall: Improving. Patient has history of cirrhosis and hepatic encephalopathy with current ammonia level of 99. She also has history of non-compliance with medication and thus is likely not taking her lactulose. Likely hepatic encephalopathy. - UDS negative - Rectal Lactulose 200g BID-while altered - Cont Rocephin -neuro checks q2 hour  -NPO pending nurse swallow evaluation  -IR to perform paracentesis today  Cirrhosis: Secondary to Hepatitis C and prior alcohol use. EMR indicated complete Hep C treatment in 2016.  INR 1.69. Ascites present on exam.  -On Rocephin for possible SBP due to AMS, tenderness on exam -limited ultrasound abdomen shows moderate to large amount of ascites- paracentesis performed today -Will continue spironolactone and lasix  after she has passed swallow test- Lasix 20 mg IV for now  AKI: Patient has acute elevation of Scr at 1.80> 1.58. Baseline appears to be around 1.08 back in 06/2017.  - CMP tomorrow - Cont home meds Lasix 40mg  daily, Spironolactone 200mg  daily after she passes a swallow test -IVF stopped and will start 20 IV Lasix until she is able to swallow pills due to crackles on exam  HTN: BP since admission has been normotensive. Highest BP noted at 123/109 this a.m. normotensive  overnight - Cont to monitor -decrease propranolol dose to 20 mg BID from 60 mg BID -not taking due to inability to swallow pills.    Hypothyroid: Patient has history of hypothyroidism.  - Cont levothyroxine daily before breakfast - TSH normal  Anxiety/Panic Diorder: Per notes from clinic patient has severe anxiety and agoraphobia. It is difficult for her to leave her house and she often misses necessary doctors appointment due to not being able to cope with the anxiety. - Cont home Lexipro 20mg  daily once she is able to swallow pills - Holding Atartax and clonopin for now until AMS resolves  FEN/GI: NPO until resolution of AMS, Protonix Prophylaxis: Heparin  Disposition: Home pending mental status improvement  Subjective:  Patient is much more alert today.  She is able to tell me her name, she believes she is in [place], and she is able to tell me it is about to be Christmas.  She is still chewing on different EKG attachments.  She has not required any mittens overnight.  Nursing reports that she has not had very many bowel movements.  She was about to be taken to IR for paracentesis.  Objective: Temp:  [98.2 F (36.8 C)-98.5 F (36.9 C)] 98.5 F (36.9 C) (12/05 0343) Pulse Rate:  [76-111] 82 (12/05 0343) Resp:  [13-18] 13 (12/05 0343) BP: (90-123)/(44-98) 90/60 (12/05 0343) SpO2:  [93 %-100 %] 100 % (12/05 0343) Physical Exam: General: NAD, confused Cardiovascular: RRR, no murmurs, normal S1, S2 split Respiratory: Diffuse wheezing with rhonchi.  Slight increased work of breathing on room air Abdomen: Swollen obese abdomen, distended, diffusely tender, soft with noted fluid wave, positive BS in all 4 quadrants  Extremities: Moves all 4 extremities Neuro: A&O to person, place, and time.   Laboratory: Recent Labs  Lab 07/26/17 2042 07/27/17 0312  WBC 9.4 12.8*  HGB 8.7* 8.6*  HCT 25.0* 25.1*  PLT 297 268   Recent Labs  Lab 07/26/17 2042  NA 130*  K 4.5  CL 101   CO2 18*  BUN 31*  CREATININE 1.80*  CALCIUM 8.6*  PROT 7.3  BILITOT 1.8*  ALKPHOS 130*  ALT 28  AST 67*  GLUCOSE 141*    Imaging/Diagnostic Tests: Ct Head Wo Contrast  Result Date: 07/27/2017 CLINICAL DATA:  Altered mental status.  Unwitnessed fall. EXAM: CT HEAD WITHOUT CONTRAST TECHNIQUE: Contiguous axial images were obtained from the base of the skull through the vertex without intravenous contrast. COMPARISON:  06/26/2017 FINDINGS: Brain: No evidence of acute infarction, hemorrhage, hydrocephalus, extra-axial collection or mass lesion/mass effect. Unchanged large area of encephalomalacia in the right frontal lobe, and left posterior parietal lobe. Mild brain parenchymal volume loss. Vascular: Mild calcific atherosclerotic disease at the skullbase. Skull: Stable left parietal septal craniotomy changes. Sinuses/Orbits: No acute finding. Other: None. IMPRESSION: Stable appearance of the brain with large area of encephalomalacia in the right frontal lobe and small area of encephalomalacia in the posterior left parietal lobe. No acute findings. Electronically Signed   By: Fidela Salisbury M.D.   On: 07/27/2017 00:17   US Abdomen Limited  Result Date: 07/27/2017 CLINICAL DATA:  Ascites. EXAM: LIMITED ABDOMEN ULTRASOUND FOR ASCITES TECHNIQUE: Limited ultrasound survey for ascites was performed in all four abdominal quadrants. COMPARISON:  06/26/2017 FINDINGS: Limited ultrasound evaluation of all 4 quadrants of the abdomen demonstrates moderate to large amount of ascites with the biggest pocket measuring 8.5 cm in the right upper quadrant. IMPRESSION: Moderate-to-large amount of abdominal ascites. Electronically Signed   By: Fidela Salisbury M.D.   On: 07/27/2017 04:12   Dg Chest Portable 1 View  Result Date: 07/26/2017 CLINICAL DATA:  Unwitnessed fall. EXAM: PORTABLE CHEST 1 VIEW COMPARISON:  06/28/2017. FINDINGS: Low lung volumes with mild interstitial edema. Heart is top-normal in size.  No aortic aneurysm. Small peripheral opacity at the right lung base may represent a focus of pleural thickening, loculated fluid or atelectasis. No acute osseous appearing abnormality. IMPRESSION: 1. Small peripheral opacity at the right lung base may represent focal pleural thickening, small fluid loculation or atelectasis. Mild interstitial edema with low lung volumes. Electronically Signed   By: Ashley Royalty M.D.   On: 07/26/2017 21:26    Cordarryl Monrreal, [place], DO 07/28/2017, 6:39 AM PGY-1, [place] [facility] Intern pager: [phone], text pages welcome

## 2017-07-28 NOTE — Procedures (Signed)
Ultrasound-guided diagnostic and therapeutic paracentesis performed yielding 3.8 liters of serous colored fluid. No immediate complications.  Wendy [name] 11:17 AM 07/28/2017

## 2017-07-28 NOTE — Progress Notes (Signed)
FPTS Interim Progress Note  S: Patient resting comfortably in bed and smiling upon entrance.  Does appear to have increased work of breathing.  Not complaining of any pain or discomfort.  O: BP 130/67 (BP Location: Right Arm)   Pulse 87   Temp 97.7 F (36.5 C) (Axillary)   Resp 20   Ht 5\' 6"  (1.676 m)   Wt 232 lb 9.4 oz (105.5 kg)   SpO2 92%   BMI 37.54 kg/m   General: NAD Respiratory: Diffuse wheezing noted on exam, increased work of breathing, good air movement noted in upper lobes with diminished sounds in bilateral bases Cardiac: RRR  A/P: Patient is more short of breath and a DG chest x-ray has been ordered to rule out aspiration pneumonia due to her AMS.  Patient satting well on nasal cannula.  Patient is more alert than she was this morning.  Duo nebs and albuterol added for comfort.  We will continue to monitor her respiratory status.  Koni Kannan, [place], DO 07/28/2017, 3:20 PM PGY-1, [place] Service pager [phone]

## 2017-07-29 ENCOUNTER — Encounter (HOSPITAL_COMMUNITY): Payer: Self-pay | Admitting: General Practice

## 2017-07-29 ENCOUNTER — Other Ambulatory Visit: Payer: Self-pay

## 2017-07-29 DIAGNOSIS — R0600 Dyspnea, unspecified: Secondary | ICD-10-CM

## 2017-07-29 DIAGNOSIS — K7290 Hepatic failure, unspecified without coma: Principal | ICD-10-CM

## 2017-07-29 DIAGNOSIS — R0609 Other forms of dyspnea: Secondary | ICD-10-CM

## 2017-07-29 DIAGNOSIS — R0902 Hypoxemia: Secondary | ICD-10-CM

## 2017-07-29 DIAGNOSIS — J90 Pleural effusion, not elsewhere classified: Secondary | ICD-10-CM

## 2017-07-29 LAB — COMPREHENSIVE METABOLIC PANEL
ALK PHOS: 109 U/L (ref 38–126)
ALT: 34 U/L (ref 14–54)
AST: 70 U/L — ABNORMAL HIGH (ref 15–41)
Albumin: 1.9 g/dL — ABNORMAL LOW (ref 3.5–5.0)
Anion gap: 9 (ref 5–15)
BILIRUBIN TOTAL: 1.6 mg/dL — AB (ref 0.3–1.2)
BUN: 24 mg/dL — ABNORMAL HIGH (ref 6–20)
CALCIUM: 8.3 mg/dL — AB (ref 8.9–10.3)
CO2: 19 mmol/L — AB (ref 22–32)
Chloride: 109 mmol/L (ref 101–111)
Creatinine, Ser: 1.5 mg/dL — ABNORMAL HIGH (ref 0.44–1.00)
GFR, EST AFRICAN AMERICAN: 44 mL/min — AB (ref >60)
GFR, EST NON AFRICAN AMERICAN: 38 mL/min — AB (ref >60)
Glucose, Bld: 157 mg/dL — ABNORMAL HIGH (ref 65–99)
Potassium: 3.6 mmol/L (ref 3.5–5.1)
Sodium: 137 mmol/L (ref 135–145)
Total Protein: 6.9 g/dL (ref 6.5–8.1)

## 2017-07-29 LAB — CBC
HCT: 25.9 % — ABNORMAL LOW (ref 36.0–46.0)
Hemoglobin: 8.7 g/dL — ABNORMAL LOW (ref 12.0–15.0)
MCH: 37.2 pg — AB (ref 26.0–34.0)
MCHC: 33.6 g/dL (ref 30.0–36.0)
MCV: 110.7 fL — ABNORMAL HIGH (ref 78.0–100.0)
PLATELETS: 288 10*3/uL (ref 150–400)
RBC: 2.34 MIL/uL — AB (ref 3.87–5.11)
RDW: 16.1 % — ABNORMAL HIGH (ref 11.5–15.5)
WBC: 11.3 10*3/uL — AB (ref 4.0–10.5)

## 2017-07-29 MED ORDER — RIFAXIMIN 550 MG PO TABS
550.0000 mg | ORAL_TABLET | Freq: Two times a day (BID) | ORAL | Status: DC
  Administered 2017-07-29 – 2017-08-01 (×7): 550 mg via ORAL
  Filled 2017-07-29 (×8): qty 1

## 2017-07-29 MED ORDER — FUROSEMIDE 80 MG PO TABS: 80.0000 mg | ORAL_TABLET | Freq: Two times a day (BID) | ORAL | Status: DC

## 2017-07-29 MED ORDER — HYDROXYZINE HCL 10 MG PO TABS
10.0000 mg | ORAL_TABLET | Freq: Three times a day (TID) | ORAL | Status: DC | PRN
  Administered 2017-07-29 (×2): 10 mg via ORAL
  Filled 2017-07-29 (×3): qty 1

## 2017-07-29 MED ORDER — HYDROXYZINE HCL 25 MG PO TABS: 12.5000 mg | ORAL_TABLET | Freq: Three times a day (TID) | ORAL | Status: DC | PRN

## 2017-07-29 MED ORDER — ENOXAPARIN SODIUM 40 MG/0.4ML ~~LOC~~ SOLN
40.0000 mg | SUBCUTANEOUS | Status: DC
  Administered 2017-07-29 – 2017-08-02 (×5): 40 mg via SUBCUTANEOUS
  Filled 2017-07-29 (×5): qty 0.4

## 2017-07-29 MED ORDER — TRAMADOL HCL 50 MG PO TABS
50.0000 mg | ORAL_TABLET | Freq: Three times a day (TID) | ORAL | Status: DC | PRN
  Administered 2017-07-29 – 2017-08-01 (×2): 50 mg via ORAL
  Filled 2017-07-29 (×2): qty 1

## 2017-07-29 MED ORDER — FUROSEMIDE 10 MG/ML IJ SOLN
60.0000 mg | Freq: Two times a day (BID) | INTRAMUSCULAR | Status: DC
  Administered 2017-07-29 – 2017-07-30 (×2): 60 mg via INTRAVENOUS
  Filled 2017-07-29 (×2): qty 6

## 2017-07-29 MED ORDER — ZOLPIDEM TARTRATE 5 MG PO TABS
5.0000 mg | ORAL_TABLET | Freq: Every evening | ORAL | Status: DC | PRN
  Administered 2017-07-29: 5 mg via ORAL
  Filled 2017-07-29: qty 1

## 2017-07-29 MED ORDER — FUROSEMIDE 10 MG/ML IJ SOLN
80.0000 mg | Freq: Once | INTRAMUSCULAR | Status: AC
  Administered 2017-07-29: 80 mg via INTRAVENOUS
  Filled 2017-07-29: qty 8

## 2017-07-29 NOTE — Consult Note (Signed)
Name: Wendy [name] MRN: [id number] DOB: Apr 09, 1961    ADMISSION DATE:  07/26/2017 CONSULTATION DATE:  12/6  REFERRING MD :  FPTS   CHIEF COMPLAINT:  pleural effusion   BRIEF PATIENT DESCRIPTION: 56 year old female with history of hepatitis C cirrhosis, history of EtOH, esophageal varices, medication noncompliance, anxiety, hepatic encephalopathy, hypertension who presented 12/3 with altered mental status after unwitnessed fall.  She was admitted by teaching service and workup revealed ammonia level of 99, negative head CT.  She was started on lactulose and has had slow improvement of her mental status.  She has had ongoing issues with cirrhosis and ascites.  She has had increasing complaints of dyspnea and CT chest 12/5 revealed large right pleural effusion and pulmonary was consulted for further recommendations.  SIGNIFICANT EVENTS  12/5 paracentesis >>3.8 L  STUDIES:  12/5 CT chest >>Large water density right pleural effusion with atelectatic changes in the right upper lobe, near complete collapse of the right middle lobe and segmental collapse of the right lower lobe. Possible superimposed airspace consolidation in the right upper lobe. Mediastinal structures are displaced to the left due to the large right pleural effusion. Patchy peripheral airspace consolidation in the left upper lobe. Advanced for age marked calcific atherosclerotic disease of the coronary arteries.   CT head 12/3 >>Stable appearance of the brain with large area of encephalomalacia in the right frontal lobe and small area of encephalomalacia in the posterior left parietal lobe.  9 No acute findings.   HISTORY OF PRESENT ILLNESS:  56 year old female with history of hepatitis C cirrhosis, history of EtOH, esophageal varices, medication noncompliance, anxiety, hepatic encephalopathy, hypertension who presented 12/3 with altered mental status after unwitnessed fall.  She was admitted by teaching service  and workup revealed ammonia level of 99, negative head CT.  She was started on lactulose and has had slow improvement of her mental status.  She has had ongoing issues with cirrhosis and ascites.  She has had increasing complaints of dyspnea and CT chest 12/5 revealed large right pleural effusion and pulmonary was consulted for further recommendations.  Patient currently sitting on bedside commode.  She does complain of shortness of breath but feels that it is better than yesterday.  RN is at bedside and states the patient looks much better than yesterday, able to speak in full sentences, moving around more easily without significant dyspnea compared to yesterday.  Denies cough, hemoptysis, fever, leg or calf pain.  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  has a past surgical history that includes ORIF hip fracture (2010); Upper gastrointestinal endoscopy (08/05/2007); Splenectomy; Trudee Kuster hole for subdural hematoma (2004); Upper gastrointestinal endoscopy (10/14/2011); Colonoscopy (08/2012); Kyphoplasty (Bilateral, 09/21/2013); Esophagogastroduodenoscopy (egd) with propofol (N/A, 01/26/2014); Nasal sinus surgery; and IR Paracentesis (07/28/2017). Prior to Admission medications   Medication Sig  Start Date End Date Taking? Authorizing Provider  acetaminophen (TYLENOL 8 HOUR) 650 MG CR tablet Take 1 tablet (650 mg total) by mouth every 8 (eight)  hours as needed for pain. 10/27/16  Yes Mayo, Pete Pelt, MD  bisacodyl ([employer] GENTLE LAXATIVE) 5 MG EC tablet TAKE 1 TABLET (5 MG TOTAL) BY MOUTH DAILY. Patient taking differently: Take 5 mg by mouth daily.  10/26/14  Yes Karamalegos, Devonne Doughty, DO  CHANTIX 0.5 MG tablet TAKE 1 TABLET BY MOUTH TWICE A DAY 06/01/17  Yes Verner Mould, MD  clonazePAM (KLONOPIN) 1 MG tablet Take 0.5-1 tablets (0.5-1 mg total) by mouth 2 (two) times daily as needed for anxiety. 04/23/17  Yes Verner Mould, MD  docusate sodium (COLACE) 100 MG capsule TAKE 1 CAPSULE BY MOUTH TWICE A DAY Patient taking differently: TAKE 100mg  BY MOUTH TWICE A DAY 07/23/17  Yes Gatha Mayer, MD  escitalopram (LEXAPRO) 20 MG tablet Take 0.5 tablets (10 mg total) daily by mouth. Stop the 10mg  tablets. Dose increased to 20mg  today 06/29/17  Yes Patrecia Pour, MD  furosemide (LASIX) 40 MG tablet Take 2 tablets (80 mg total) by mouth daily. 05/20/17  Yes Gatha Mayer, MD  gabapentin (NEURONTIN) 300 MG capsule Take 1 capsule (300 mg total) by mouth 3 (three) times daily. 12/16/16  Yes Mikell, Jeani Sow, MD  hydrOXYzine (ATARAX/VISTARIL) 25 MG tablet TAKE 1 TO 2 TABLETS BY MOUTH TWICE A DAY AS NEEDED FOR ITCHING Patient taking differently: TAKE 25mg  to 50mg  MOUTH TWICE A DAY AS NEEDED FOR ITCHING 06/01/17  Yes Verner Mould, MD  levothyroxine (SYNTHROID, LEVOTHROID) 200 MCG tablet Take 1 tablet (200 mcg total) by mouth daily before breakfast. 06/22/16  Yes Verner Mould, MD  loratadine (CLARITIN) 10 MG tablet TAKE 1 TABLET BY MOUTH EVERY DAY Patient taking differently: TAKE 10mg  BY MOUTH EVERY DAY 04/20/17  Yes Verner Mould, MD  montelukast (SINGULAIR) 10 MG tablet TAKE 1 TABLET (10 MG TOTAL) BY MOUTH DAILY. 04/09/17  Yes Kozlow, Donnamarie Poag, MD    omeprazole (PRILOSEC) 20 MG capsule TAKE 1 CAPSULE (20 MG TOTAL) BY MOUTH DAILY. 12/08/16  Yes Verner Mould, MD  propranolol (INDERAL) 60 MG tablet TAKE 1 TABLET (60 MG TOTAL) BY MOUTH 2 (TWO) TIMES DAILY. 07/19/17  Yes Verner Mould, MD  QUEtiapine (SEROQUEL) 25 MG tablet Take 1 tablet (25 mg total) by mouth at bedtime. 01/22/17  Yes Merian Capron, MD  ranitidine (ZANTAC) 300 MG tablet TAKE 1 TABLET (300 MG TOTAL) BY MOUTH AT BEDTIME. 07/19/17  Yes Gatha Mayer, MD  rifaximin (XIFAXAN) 550 MG TABS tablet Take 1 tablet (550 mg total) by mouth 2 (two) times daily. 06/18/15  Yes Gatha Mayer, MD  spironolactone (ALDACTONE) 100 MG tablet TAKE 1.5 TABLETS (150 MG TOTAL) BY MOUTH DAILY. 07/02/17  Yes Gatha Mayer, MD  traMADol (ULTRAM) 50 MG tablet Take 1 tablet (50 mg total) by mouth 2 (two) times daily. 06/10/17  Yes Verner Mould, MD  traZODone (DESYREL) 50 MG tablet TAKE 0.5 TABLETS (25 MG TOTAL) BY MOUTH AT BEDTIME AS NEEDED FOR SLEEP. 06/07/17  Yes Verner Mould, MD  cycloSPORINE (RESTASIS) 0.05 % ophthalmic emulsion Place 1 drop into both eyes 2 (two) times daily. Patient not taking: Reported on 07/27/2017 04/28/16   Rogue Bussing, MD  fluticasone [facility]) 50 MCG/ACT nasal spray Place 2 sprays into both nostrils daily. Patient not taking: Reported on 07/27/2017 04/28/16   Rogue Bussing, MD  lactulose (CHRONULAC) 10 GM/15ML solution Take 30 mLs (20 g total) 3 (three) times daily by mouth. Take 2 tbsp by mouth 2 times  a day Patient not taking: Reported on 07/27/2017 06/29/17   Patrecia Pour, MD  spironolactone (ALDACTONE) 100 MG tablet Take 2 tablets (200 mg total) by mouth daily. Patient not taking: Reported on 07/27/2017 06/18/17   Gatha Mayer, MD   Allergies  Allergen Reactions  . Codeine Phosphate Itching  . Codeine Rash    FAMILY HISTORY:  family history includes Anxiety disorder in her mother; Cancer in her  maternal grandmother; Heart disease in her mother; Kidney failure in her sister; Other in her daughter and daughter. SOCIAL HISTORY:  reports that she has been smoking cigarettes.  She has a 2.50 pack-year smoking history. she has never used smokeless tobacco. She reports that she does not drink alcohol or use drugs.  REVIEW OF SYSTEMS:   As per HPI - All other systems reviewed and were neg.    SUBJECTIVE:   VITAL SIGNS: Temp:  [97.5 F (36.4 C)-98.6 F (37 C)] 98.6 F (37 C) (12/06 1137) Pulse Rate:  [84-98] 98 (12/06 1137) Resp:  [13-30] 28 (12/06 1137) BP: (119-136)/(66-85) 119/68 (12/06 1137) SpO2:  [92 %-100 %] 97 % (12/06 1137) Weight:  [99.1 kg (218 lb 7.6 oz)] 99.1 kg (218 lb 7.6 oz) (12/06 0500)  PHYSICAL EXAMINATION: General: Obese, chronically ill-appearing female, no acute distress on bedside commode Neuro: Awake, alert, mild confusion but overall appropriate, moves all extremities HEENT: Mucous membranes moist, mild JVD Cardiovascular: S1-S2 regular rate and rhythm Lungs: Respirations are even and nonlabored on nasal cannula, few scattered crackles left, diminished right Abdomen: Round, soft, ascites, nontender Musculoskeletal: Warm and dry, 2+ edema   Recent Labs  Lab 07/26/17 2042 07/28/17 0850 07/29/17 0351  NA 130* 135 137  K 4.5 4.3 3.6  CL 101 105 109  CO2 18* 18* 19*  BUN 31* 28* 24*  CREATININE 1.80* 1.58* 1.50*  GLUCOSE 141* 106* 157*   Recent Labs  Lab 07/27/17 0312 07/28/17 1247 07/29/17 0351  HGB 8.6* 8.6* 8.7*  HCT 25.1* 25.9* 25.9*  WBC 12.8* 9.1 11.3*  PLT 268 280 288   Dg Chest 2 View  Result Date: 07/28/2017 CLINICAL DATA:  Shortness of breath, cough. EXAM: CHEST  2 VIEW COMPARISON:  Radiograph July 26, 2017. FINDINGS: The heart size and mediastinal contours are within normal limits. No pneumothorax is noted. Left lung is clear. Moderate right pleural effusion is noted with probable underlying atelectasis or infiltrate. The  visualized skeletal structures are unremarkable. IMPRESSION: Moderate right pleural effusion with underlying atelectasis or infiltrate. Electronically Signed   By: Marijo Conception, M.D.   On: 07/28/2017 16:41   Ct Chest W Contrast  Result Date: 07/28/2017 CLINICAL DATA:  Pleural effusion.  Chronic cough. EXAM: CT CHEST WITH CONTRAST TECHNIQUE: Multidetector CT imaging of the chest was performed during intravenous contrast administration. CONTRAST:  33mL ISOVUE-300 IOPAMIDOL (ISOVUE-300) INJECTION 61% COMPARISON:  Chest radiograph 07/28/2017 FINDINGS: Cardiovascular: Normal heart size. No pericardial effusion. Heavy calcific atherosclerotic disease of the coronary arteries and the milder atherosclerotic disease of the thoracic aorta. The mediastinal structures are displaced to the left due to presence of large right pleural effusion. Mediastinum/Nodes: No enlarged mediastinal, hilar, or axillary lymph nodes. Thyroid gland, trachea, and esophagus demonstrate no significant findings. Lungs/Pleura: Large water density right pleural effusion. There is airspace consolidation versus atelectasis in the right upper lobe. Near complete collapse of the right middle lobe. Partial collapse of segmental branches of the right lower lobe. Mild emphysematous changes in the lung apices. Patchy airspace consolidation peripherally  in the left upper lobe. Upper Abdomen: Cirrhotic appearance of the liver. Small amount of upper abdominal ascites. Cholelithiasis. Soft tissue edema of the abdominal wall. Musculoskeletal: No chest wall abnormality. No acute or significant osseous findings. IMPRESSION: Large water density right pleural effusion with atelectatic changes in the right upper lobe, near complete collapse of the right middle lobe and segmental collapse of the right lower lobe. Possible superimposed airspace consolidation in the right upper lobe. Mediastinal structures are displaced to the left due to the large right pleural  effusion. Patchy peripheral airspace consolidation in the left upper lobe. Advanced for age marked calcific atherosclerotic disease of the coronary arteries. Aortic Atherosclerosis (ICD10-I70.0) and Emphysema (ICD10-J43.9). Cirrhotic liver with small amount of ascites. Cholelithiasis. Chest wall/abdominal wall edema. Electronically Signed   By: Fidela Salisbury M.D.   On: 07/28/2017 23:58   Ir Paracentesis  Result Date: 07/28/2017 INDICATION: History of alcoholic cirrhosis with hepatic that the. Request is made for diagnostic and therapeutic paracentesis. EXAM: ULTRASOUND GUIDED DIAGNOSTIC AND THERAPEUTIC PARACENTESIS MEDICATIONS: 2% lidocaine COMPLICATIONS: None immediate. PROCEDURE: Emergency written consent was obtained from the IR as well as the patient's attending physician after a discussion of the risks, benefits and alternatives to treatment. A timeout was performed prior to the initiation of the procedure. Initial ultrasound scanning demonstrates a moderate amount of ascites within the right upper abdominal quadrant. The right upper abdomen was prepped and draped in the usual sterile fashion. 2% lidocaine was used for local anesthesia. Following this, a 19 gauge, 15-cm, Yueh catheter was introduced. An ultrasound image was saved for documentation purposes. The paracentesis was performed. The catheter was removed and a dressing was applied. The patient tolerated the procedure well without immediate post procedural complication. FINDINGS: A total of approximately 3.6 L of serous fluid was removed. Samples were sent to the laboratory as requested by the clinical team. IMPRESSION: Successful ultrasound-guided paracentesis yielding 3.6 liters of peritoneal fluid. Read by: Saverio Danker, PA-C Electronically Signed   By: Sandi Mariscal M.D.   On: 07/28/2017 11:34    ASSESSMENT / PLAN:  Dyspnea Large right pleural effusion-in the setting of significant cirrhosis with ascites status post paracentesis 12/5  removing 3.8L. ?  Aspiration pneumonia   Plan- Continue aggressive diuresis as renal function and blood pressure tolerate No role for thoracentesis at this time-poor choice in hepatic failure as fluid will immediately reaccumulate Continue antibiotics per primary team Continue routine paracentesis as needed for ascites Continue lactulose Trend ammonia Swallow eval pending F/u CXR     Nickolas Madrid, NP 07/29/2017  11:46 AM Pager: [phone] or [phone]  Attending Note:  56 year old female with advanced liver disease who presents to PCCM with pleural effusion, hypoxemia and SOB.  One exam, decreased BS on the right.  I reviewed CXR myself, pleural effusion noted.  Discussed with PCCM-NP.  Pleural effusion: hepatic hydrothorax, would recur very quickly after a thora  - No thora  - Consider TIPS  Increased WOB: due to pleural effusion and ascites   - Consider TIPS  Hypoxemia:  - Titrate O2 for sat of 88-92%  - May need home O2  PCCM will sign off, please call back if needed.  Patient seen and examined, agree with above note.  I dictated the care and orders written for this patient under my direction.  Rush Farmer, [place]

## 2017-07-29 NOTE — Progress Notes (Signed)
FPTS Interim Progress Note  S: Patient seen due to increase work of breathing. Patient reports that she hurts and is having trouble breathing.   O: BP 119/68 (BP Location: Right Arm)   Pulse 93   Temp 98.3 F (36.8 C) (Oral)   Resp (!) 21   Ht 5\' 6"  (1.676 m)   Wt 218 lb 7.6 oz (99.1 kg)   SpO2 97%   BMI 35.26 kg/m   General: NAD, sitting up in bed Resp: Increased work of breathing, some wheezing noted in L lung, with absent breath sounds on the R Psych: appears anxious  A/P: Patient with increased work of breathing, seen previously by pulmonology who do not feel that she needs thoracentesis and have recommended diuresing aggressively. Patient just received her 60 mg IV Lasix. We have ordered her home atarax but it has not arrived yet. Patient is easily distractible and there is concern for anxiety component as well. Her breathing and saturations are much improved while she is talking.    Mareo Portilla, [place], DO 07/29/2017, 4:38 PM PGY-1, [place] Medicine Service pager [phone]

## 2017-07-29 NOTE — Progress Notes (Signed)
   07/29/17 0444  Urine Characteristics  Urinary Interventions Bladder scan  Bladder Scan Volume (mL) 320 mL   Pt stated she was unable to void, pt tried twice, no success. RN unable to tell if pt has voided during shift due to pt being incontinent with watery stools due to lactulose. MD paged. No new orders received at this time. Will continue to monitor.

## 2017-07-29 NOTE — Progress Notes (Signed)
Family Medicine Teaching Service Daily Progress Note Intern Pager: [phone]  Patient name: Wendy [name] Medical record number: [id number] Date of birth: 08-08-1961 Age: 56 y.o. Gender: female  Primary Care Provider: Verner Mould, MD Consultants: IR Code Status: Full  Pt Overview and Major Events to Date:  Wendy [name] is a 56 y.o. female presenting with AMS and an unwitnessed fall . 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 and past alcohol use, Esophageal varices, non-compliance with medications, anxiety, panic disorder, frequent falls, HTN, GERD, and Obesity  Assessment and Plan:  AMS/unwitnessed fall: AMS Improving. Patient has history of cirrhosis and hepatic encephalopathy with ammonia level of 99 at admission. She has history of non-compliance with medication. Likely hepatic encephalopathy. -Home Lactulose 30g BID- discontinued this am -neuro checks q4 hour  -Regular diet -IR perform paracentesis and removed 3.8 L of fluid which was not suggestive of SBP - Unasyn started (12/7-) for possible aspiration pneumonia as WBC increased to 17  R. Pleural effusion: CT chest with contrast obtained which showed large water density right pleural effusion with atelectatic changes in the right upper lobe, near complete collapse of the right middle lobe and segmental collapse of the right lower lobe. -Pulmonology consulted, appreciate recommendations- aggressive diuresis, no thoracentesis now -IV Lasix 60 mg BID  Anxiety/Panic Diorder:  Prior notes from clinic patient has severe anxiety and [place] phobia.  She misses most of her doctor's appointments due to not being able to cope with anxiety.   -Continue home Lexapro 20 mg daily  -Restarted Atarax and holding Klonopin for now.  Patient reports Klonopin does not help her with her anxiety very much, however she takes it daily.   -Started patient on Zoloft 25 mg daily to help with future  anxiety. -Overnight patient was given Haldol 2 mg due to her anxiety, restraints were tried- however she is not in restraints now, and sitter was placed at bedside.  Patient was also given Ativan 0.5 and Ambien 5 mg.  None of these helped.  Appears to be related to being alone in the room. - Will add scheduled ativan and start CIWA protocol as she was previously on klonipin daily  Cirrhosis: Secondary to Hepatitis C and prior alcohol use. EMR indicated complete Hep C treatment in 2016. MELD score 21 per GI -Rocephin (12/4-12/7), Unasyn (12/7-) -s/p paracentesis removing 3.8 L 12/05 -Continue spironolactone, rifaximin -GI consulted for possible TIPS  AKI: Improving. Patient has acute elevation of Scr at 1.80> 1.49. Baseline appears to be around 1.08 back in 06/2017.  - Continue to monitor - Cont IV Lasix 60mg  BID, Spironolactone 200mg  daily  HTN: BP since admission has been normotensive.  - Cont to monitor -Holding propranolol dose to 20 mg BID from 60 mg BID  Hypothyroid: Patient has history of hypothyroidism.  - Cont levothyroxine daily before breakfast - TSH normal  FEN/GI: Regular diet, protonix Prophylaxis: Lovenox  Disposition: Home when medically stable  Subjective:  Patient sitting up in bed with increased work of breathing.  She swelling went into the room and we have a long conversation at which point it appears that her breathing becomes more steady.  Reports that she had a very bad night and felt like she was being given a lot of drugs to help calm her down, however none of these seem to help her. She says she feels like she is drowning and it is again explained that pulmonology did not wish to drain the fluid bc  it would return.   Objective: Temp:  [97.7 F (36.5 C)-98.6 F (37 C)] 98.1 F (36.7 C) (12/07 0350) Pulse Rate:  [91-110] 106 (12/07 0400) Resp:  [13-32] 26 (12/07 0400) BP: (96-151)/(68-110) 144/88 (12/07 0402) SpO2:  [89 %-100 %] 98 % (12/07  0400) FiO2 (%):  [40 %] 40 % (12/07 0114) Physical Exam: General: NAD, pleasant, increased work of breathing Cardiovascular: RRR, no murmurs, normal S1, S2 split, no LE edema Respiratory: L lung with no wheezing or crackles on exam and good air exchange. Increased work of breathing on 3.5 L. R lung with diminished breath sounds and air exchange. Abdomen: Swollen obese abdomen, distended, nontender, soft with no fluid wave noted, positive BS in all 4 quadrants Extremities: Moves all 4 extremities Neuro: A&O to person, place, and time. Mild confusion, much improved   Laboratory: Recent Labs  Lab 07/28/17 1247 07/29/17 0351 07/30/17 0402  WBC 9.1 11.3* 17.0*  HGB 8.6* 8.7* 9.2*  HCT 25.9* 25.9* 27.6*  PLT 280 288 249   Recent Labs  Lab 07/28/17 0850 07/29/17 0351 07/30/17 0402  NA 135 137 136  K 4.3 3.6 3.7  CL 105 109 108  CO2 18* 19* 17*  BUN 28* 24* 21*  CREATININE 1.58* 1.50* 1.49*  CALCIUM 8.6* 8.3* 8.6*  PROT 6.8 6.9 7.4  BILITOT 2.0* 1.6* 1.8*  ALKPHOS 107 109 113  ALT 32 34 38  AST 79* 70* 69*  GLUCOSE 106* 157* 164*    Imaging/Diagnostic Tests: Ct Head Wo Contrast  Result Date: 07/27/2017 CLINICAL DATA:  Altered mental status.  Unwitnessed fall. EXAM: CT HEAD WITHOUT CONTRAST TECHNIQUE: Contiguous axial images were obtained from the base of the skull through the vertex without intravenous contrast. COMPARISON:  06/26/2017 FINDINGS: Brain: No evidence of acute infarction, hemorrhage, hydrocephalus, extra-axial collection or mass lesion/mass effect. Unchanged large area of encephalomalacia in the right frontal lobe, and left posterior parietal lobe. Mild brain parenchymal volume loss. Vascular: Mild calcific atherosclerotic disease at the skullbase. Skull: Stable left parietal septal craniotomy changes. Sinuses/Orbits: No acute finding. Other: None. IMPRESSION: Stable appearance of the brain with large area of encephalomalacia in the right frontal lobe and small area  of encephalomalacia in the posterior left parietal lobe. No acute findings. Electronically Signed   By: Fidela Salisbury M.D.   On: 07/27/2017 00:17   US Abdomen Limited  Result Date: 07/27/2017 CLINICAL DATA:  Ascites. EXAM: LIMITED ABDOMEN ULTRASOUND FOR ASCITES TECHNIQUE: Limited ultrasound survey for ascites was performed in all four abdominal quadrants. COMPARISON:  06/26/2017 FINDINGS: Limited ultrasound evaluation of all 4 quadrants of the abdomen demonstrates moderate to large amount of ascites with the biggest pocket measuring 8.5 cm in the right upper quadrant. IMPRESSION: Moderate-to-large amount of abdominal ascites. Electronically Signed   By: Fidela Salisbury M.D.   On: 07/27/2017 04:12   Dg Chest Portable 1 View  Result Date: 07/26/2017 CLINICAL DATA:  Unwitnessed fall. EXAM: PORTABLE CHEST 1 VIEW COMPARISON:  06/28/2017. FINDINGS: Low lung volumes with mild interstitial edema. Heart is top-normal in size. No aortic aneurysm. Small peripheral opacity at the right lung base may represent a focus of pleural thickening, loculated fluid or atelectasis. No acute osseous appearing abnormality. IMPRESSION: 1. Small peripheral opacity at the right lung base may represent focal pleural thickening, small fluid loculation or atelectasis. Mild interstitial edema with low lung volumes. Electronically Signed   By: Ashley Royalty M.D.   On: 07/26/2017 21:26    Jazzlin Clements, [place], DO 07/30/2017, 6:06 AM  PGY-1, [place] Intern pager: [phone], text pages welcome

## 2017-07-29 NOTE — Progress Notes (Signed)
Family Medicine Teaching Service Daily Progress Note Intern Pager: [phone]  Patient name: Wendy [name] Medical record number: [id number] Date of birth: 1961/01/25 Age: 56 y.o. Gender: female  Primary Care Provider: Verner Mould, MD Consultants: IR Code Status: Full  Pt Overview and Major Events to Date:  Wendy [name] is a 56 y.o. female presenting with AMS and an unwitnessed fall . 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 and past alcohol use, Esophageal varices, non-compliance with medications, anxiety, panic disorder, frequent falls, HTN, GERD, and Obesity  Assessment and Plan:  AMS/unwitnessed fall: AMS Improving. Patient has history of cirrhosis and hepatic encephalopathy with ammonia level of 99 at admission. She has history of non-compliance with medication. Likely hepatic encephalopathy. - Home Lactulose 30g BID- has had multiple BM's - Will start Unasyn for anaerobic coverage -neuro checks q4 hour  -Regular diet -IR perform paracentesis and removed 3.8 L of fluid which was not suggestive of SBP  R. Pleural effusion: [facility] chest x-ray yesterday revealing possible right pleural effusion.  CT chest with contrast obtained which showed large water density right pleural effusion with atelectatic changes in the right upper lobe, near complete collapse of the right middle lobe and segmental collapse of the right lower lobe. -Pulmonology consulted for thoracentesis, appreciate recommendations -Continue home Lasix 80 mg daily -On rocephin- concern for aspiration pneumonia, switched to unasyn- WBC increased slightly to 11.3  Cirrhosis: Secondary to Hepatitis C and prior alcohol use. EMR indicated complete Hep C treatment in 2016.  INR 1.69.  -Continue Rocephin, restarted rifaximin now that she can tolerate PO -s/p paracentesis removing 3.8 L 12/05 -Will continue spironolactone and lasix  after she has passed swallow test- Lasix 20 mg  IV for now  AKI: Improving. Patient has acute elevation of Scr at 1.80> 1.50. Baseline appears to be around 1.08 back in 06/2017.  - Continue to monitor - Cont home meds Lasix 80mg  daily, Spironolactone 200mg  daily - IVF stopped, regular diet ordered  HTN: BP since admission has been normotensive.  - Cont to monitor -Holding propranolol dose to 20 mg BID from 60 mg BID  Hypothyroid: Patient has history of hypothyroidism.  - Cont levothyroxine daily before breakfast - TSH normal  Anxiety/Panic Diorder: Per notes from clinic patient has severe anxiety and agoraphobia. It is difficult for her to leave her house and she often misses necessary doctors appointment due to not being able to cope with the anxiety. - Cont home Lexipro 20mg  daily once she is able to swallow pills - Holding Atartax and clonopin for now until AMS resolves  FEN/GI: NPO until resolution of AMS, Protonix Prophylaxis: Heparin  Disposition: Home pending mental status improvement  Subjective:  Patient much more alert today. She is is able to tell me what brought her to the   Objective: Temp:  [97.5 F (36.4 C)-98.2 F (36.8 C)] 98.1 F (36.7 C) (12/06 0247) Pulse Rate:  [82-97] 97 (12/06 0247) Resp:  [16-30] 30 (12/06 0247) BP: (123-136)/(66-109) 136/85 (12/06 0247) SpO2:  [92 %-100 %] 97 % (12/06 0327) Weight:  [218 lb 7.6 oz (99.1 kg)] 218 lb 7.6 oz (99.1 kg) (12/06 0500) Physical Exam: General: NAD, pleasant Cardiovascular: RRR, no murmurs, normal S1, S2 split Respiratory: L lung with no wheezing or crackles on exam and good air exchange. Increased work of breathing on 3 L. R lung with diminished breath sounds and air exchange. Abdomen: Swollen obese abdomen, distended, nontender, soft with no fluid wave noted, positive  BS in all 4 quadrants Extremities: Moves all 4 extremities Neuro: A&O to person, place, and time. Mild confusion, much improved   Laboratory: Recent Labs  Lab 07/27/17 0312  07/28/17 1247 07/29/17 0351  WBC 12.8* 9.1 11.3*  HGB 8.6* 8.6* 8.7*  HCT 25.1* 25.9* 25.9*  PLT 268 280 288   Recent Labs  Lab 07/26/17 2042 07/28/17 0850 07/29/17 0351  NA 130* 135 137  K 4.5 4.3 3.6  CL 101 105 109  CO2 18* 18* 19*  BUN 31* 28* 24*  CREATININE 1.80* 1.58* 1.50*  CALCIUM 8.6* 8.6* 8.3*  PROT 7.3 6.8 6.9  BILITOT 1.8* 2.0* 1.6*  ALKPHOS 130* 107 109  ALT 28 32 34  AST 67* 79* 70*  GLUCOSE 141* 106* 157*    Imaging/Diagnostic Tests: Ct Head Wo Contrast  Result Date: 07/27/2017 CLINICAL DATA:  Altered mental status.  Unwitnessed fall. EXAM: CT HEAD WITHOUT CONTRAST TECHNIQUE: Contiguous axial images were obtained from the base of the skull through the vertex without intravenous contrast. COMPARISON:  06/26/2017 FINDINGS: Brain: No evidence of acute infarction, hemorrhage, hydrocephalus, extra-axial collection or mass lesion/mass effect. Unchanged large area of encephalomalacia in the right frontal lobe, and left posterior parietal lobe. Mild brain parenchymal volume loss. Vascular: Mild calcific atherosclerotic disease at the skullbase. Skull: Stable left parietal septal craniotomy changes. Sinuses/Orbits: No acute finding. Other: None. IMPRESSION: Stable appearance of the brain with large area of encephalomalacia in the right frontal lobe and small area of encephalomalacia in the posterior left parietal lobe. No acute findings. Electronically Signed   By: Fidela Salisbury M.D.   On: 07/27/2017 00:17   US Abdomen Limited  Result Date: 07/27/2017 CLINICAL DATA:  Ascites. EXAM: LIMITED ABDOMEN ULTRASOUND FOR ASCITES TECHNIQUE: Limited ultrasound survey for ascites was performed in all four abdominal quadrants. COMPARISON:  06/26/2017 FINDINGS: Limited ultrasound evaluation of all 4 quadrants of the abdomen demonstrates moderate to large amount of ascites with the biggest pocket measuring 8.5 cm in the right upper quadrant. IMPRESSION: Moderate-to-large amount of  abdominal ascites. Electronically Signed   By: Fidela Salisbury M.D.   On: 07/27/2017 04:12   Dg Chest Portable 1 View  Result Date: 07/26/2017 CLINICAL DATA:  Unwitnessed fall. EXAM: PORTABLE CHEST 1 VIEW COMPARISON:  06/28/2017. FINDINGS: Low lung volumes with mild interstitial edema. Heart is top-normal in size. No aortic aneurysm. Small peripheral opacity at the right lung base may represent a focus of pleural thickening, loculated fluid or atelectasis. No acute osseous appearing abnormality. IMPRESSION: 1. Small peripheral opacity at the right lung base may represent focal pleural thickening, small fluid loculation or atelectasis. Mild interstitial edema with low lung volumes. Electronically Signed   By: Ashley Royalty M.D.   On: 07/26/2017 21:26    Marley Charlot, [place], DO 07/29/2017, 6:55 AM PGY-1, [place] Intern pager: [phone], text pages welcome

## 2017-07-29 NOTE — Progress Notes (Signed)
FMTS Attending Daily Note:   S:  Dyspnea continues.  Patient feels less confused today.  Can hold conversation.  Thirsty.  No real pain today  Exam: Gen:  Alert, cooperative patient who appears stated age in no acute distress.  Vital signs reviewed. Heart:  RRR Lungs:  Decreased BS on Right.   Abd:  Mildly distended. Nontender Neuro:  Awake and alert.  Oriented to person and place.  No focal neuro deficits.  Confluent and conversant speech  Imp/Plan: 1. Dyspnea: - now major issue - CT yesterday revealed large Right pleural effusion - Near collapse of Right middle and segmental RLL displacing mediastinal structures to left - consult pulm for further recommendations, possible thoracentesis  2.  Hepatic encephalopathy: - Clearing with lactulose - decrease dose of lactulose  - much less confused today than she has been  3.  Leukocytosis: - question of consolidation in RUL.  Possibility of aspiration PNA - afebrile thus far - await pulm input re: possible thoracentesis.  Follow WBC  I will sign resident note with other chronic problems once completed.   Alveda Reasons, MD 07/29/2017 9:23 AM

## 2017-07-29 NOTE — Progress Notes (Signed)
I was asked to see the patient by housestaff again for dyspnea. When I arrived, she was breathing comfortably on room air. Continue diuresis as planned.

## 2017-07-29 NOTE — Progress Notes (Signed)
Pt has had continued shortness of breath during shift. At 4:00 patient began to struggle with breathing and saturations dropped first below 90 then below 80 while on 3 liters and bed in high fowlers position. Called physician to come up to the floor and lay eyes on the patient. 60 mg of lasix via IV were give along with tramadol for complaints of pain, waiting on vistaril to help with her restlessness and anxiety related to "feeling like she is drowning".

## 2017-07-30 ENCOUNTER — Inpatient Hospital Stay (HOSPITAL_COMMUNITY): Payer: Medicare Other

## 2017-07-30 DIAGNOSIS — B1710 Acute hepatitis C without hepatic coma: Secondary | ICD-10-CM

## 2017-07-30 DIAGNOSIS — K7460 Unspecified cirrhosis of liver: Secondary | ICD-10-CM

## 2017-07-30 LAB — COMPREHENSIVE METABOLIC PANEL
ALBUMIN: 2.1 g/dL — AB (ref 3.5–5.0)
ALK PHOS: 113 U/L (ref 38–126)
ALT: 38 U/L (ref 14–54)
AST: 69 U/L — AB (ref 15–41)
Anion gap: 11 (ref 5–15)
BILIRUBIN TOTAL: 1.8 mg/dL — AB (ref 0.3–1.2)
BUN: 21 mg/dL — AB (ref 6–20)
CALCIUM: 8.6 mg/dL — AB (ref 8.9–10.3)
CO2: 17 mmol/L — AB (ref 22–32)
Chloride: 108 mmol/L (ref 101–111)
Creatinine, Ser: 1.49 mg/dL — ABNORMAL HIGH (ref 0.44–1.00)
GFR calc Af Amer: 44 mL/min — ABNORMAL LOW (ref >60)
GFR calc non Af Amer: 38 mL/min — ABNORMAL LOW (ref >60)
GLUCOSE: 164 mg/dL — AB (ref 65–99)
POTASSIUM: 3.7 mmol/L (ref 3.5–5.1)
SODIUM: 136 mmol/L (ref 135–145)
Total Protein: 7.4 g/dL (ref 6.5–8.1)

## 2017-07-30 LAB — CBC
HEMATOCRIT: 27.6 % — AB (ref 36.0–46.0)
HEMOGLOBIN: 9.2 g/dL — AB (ref 12.0–15.0)
MCH: 36.9 pg — AB (ref 26.0–34.0)
MCHC: 33.3 g/dL (ref 30.0–36.0)
MCV: 110.8 fL — ABNORMAL HIGH (ref 78.0–100.0)
Platelets: 249 10*3/uL (ref 150–400)
RBC: 2.49 MIL/uL — ABNORMAL LOW (ref 3.87–5.11)
RDW: 16 % — AB (ref 11.5–15.5)
WBC: 17 10*3/uL — ABNORMAL HIGH (ref 4.0–10.5)

## 2017-07-30 LAB — BLOOD GAS, ARTERIAL
ACID-BASE DEFICIT: 6 mmol/L — AB (ref 0.0–2.0)
Bicarbonate: 17.3 mmol/L — ABNORMAL LOW (ref 20.0–28.0)
DRAWN BY: 511911
EXPIRATORY PAP: 6
FIO2: 40
INSPIRATORY PAP: 12
MODE: POSITIVE
O2 SAT: 94.6 %
PCO2 ART: 25.3 mmHg — AB (ref 32.0–48.0)
PEEP: 6 cmH2O
PH ART: 7.449 (ref 7.350–7.450)
Patient temperature: 98.6
RATE: 8 resp/min
pO2, Arterial: 74.1 mmHg — ABNORMAL LOW (ref 83.0–108.0)

## 2017-07-30 LAB — GLUCOSE, PLEURAL OR PERITONEAL FLUID: Glucose, Fluid: 141 mg/dL

## 2017-07-30 LAB — BODY FLUID CELL COUNT WITH DIFFERENTIAL
Eos, Fluid: 1 %
LYMPHS FL: 18 %
Monocyte-Macrophage-Serous Fluid: 72 % (ref 50–90)
NEUTROPHIL FLUID: 9 % (ref 0–25)
Total Nucleated Cell Count, Fluid: 378 cu mm (ref 0–1000)

## 2017-07-30 LAB — AMMONIA: Ammonia: 62 umol/L — ABNORMAL HIGH (ref 9–35)

## 2017-07-30 LAB — PROTIME-INR
INR: 1.94
PROTHROMBIN TIME: 22 s — AB (ref 11.4–15.2)

## 2017-07-30 LAB — PATHOLOGIST SMEAR REVIEW: Path Review: REACTIVE

## 2017-07-30 LAB — LACTATE DEHYDROGENASE, PLEURAL OR PERITONEAL FLUID: LD, Fluid: 46 U/L — ABNORMAL HIGH (ref 3–23)

## 2017-07-30 LAB — LACTIC ACID, PLASMA
LACTIC ACID, VENOUS: 2.8 mmol/L — AB (ref 0.5–1.9)
Lactic Acid, Venous: 2.7 mmol/L (ref 0.5–1.9)

## 2017-07-30 LAB — PROCALCITONIN: PROCALCITONIN: 0.21 ng/mL

## 2017-07-30 MED ORDER — FUROSEMIDE 10 MG/ML IJ SOLN
80.0000 mg | Freq: Two times a day (BID) | INTRAMUSCULAR | Status: DC
  Administered 2017-07-30 – 2017-08-01 (×4): 80 mg via INTRAVENOUS
  Filled 2017-07-30 (×4): qty 8

## 2017-07-30 MED ORDER — SPIRONOLACTONE 100 MG PO TABS
300.0000 mg | ORAL_TABLET | Freq: Every day | ORAL | Status: DC
  Administered 2017-07-31 – 2017-08-01 (×2): 300 mg via ORAL
  Filled 2017-07-30: qty 12
  Filled 2017-07-30 (×2): qty 3

## 2017-07-30 MED ORDER — THIAMINE HCL 100 MG/ML IJ SOLN
100.0000 mg | Freq: Every day | INTRAMUSCULAR | Status: DC
  Administered 2017-08-02: 100 mg via INTRAVENOUS
  Filled 2017-07-30: qty 2

## 2017-07-30 MED ORDER — HALOPERIDOL LACTATE 5 MG/ML IJ SOLN
2.0000 mg | Freq: Once | INTRAMUSCULAR | Status: AC
  Administered 2017-07-30: 2 mg via INTRAVENOUS
  Filled 2017-07-30: qty 1

## 2017-07-30 MED ORDER — SODIUM CHLORIDE 0.9 % IV SOLN
3.0000 g | Freq: Three times a day (TID) | INTRAVENOUS | Status: DC
  Administered 2017-07-30 – 2017-08-01 (×7): 3 g via INTRAVENOUS
  Filled 2017-07-30 (×8): qty 3

## 2017-07-30 MED ORDER — DEXTROSE 5 % IV SOLN
500.0000 mg | INTRAVENOUS | Status: DC
  Administered 2017-07-30 – 2017-07-31 (×2): 500 mg via INTRAVENOUS
  Filled 2017-07-30 (×3): qty 500

## 2017-07-30 MED ORDER — LORAZEPAM 1 MG PO TABS: 1.0000 mg | ORAL_TABLET | Freq: Four times a day (QID) | ORAL | Status: DC | PRN

## 2017-07-30 MED ORDER — LORAZEPAM 2 MG/ML IJ SOLN
1.0000 mg | Freq: Every day | INTRAMUSCULAR | Status: DC
  Filled 2017-07-30: qty 1

## 2017-07-30 MED ORDER — FOLIC ACID 1 MG PO TABS
1.0000 mg | ORAL_TABLET | Freq: Every day | ORAL | Status: DC
  Administered 2017-07-30 – 2017-08-01 (×3): 1 mg via ORAL
  Filled 2017-07-30 (×3): qty 1

## 2017-07-30 MED ORDER — ADULT MULTIVITAMIN W/MINERALS CH
1.0000 | ORAL_TABLET | Freq: Every day | ORAL | Status: DC
  Administered 2017-07-30 – 2017-08-01 (×3): 1 via ORAL
  Filled 2017-07-30 (×3): qty 1

## 2017-07-30 MED ORDER — VITAMIN B-1 100 MG PO TABS
100.0000 mg | ORAL_TABLET | Freq: Every day | ORAL | Status: DC
  Administered 2017-07-30 – 2017-08-01 (×3): 100 mg via ORAL
  Filled 2017-07-30 (×3): qty 1

## 2017-07-30 MED ORDER — LORAZEPAM 2 MG/ML IJ SOLN
1.0000 mg | Freq: Four times a day (QID) | INTRAMUSCULAR | Status: DC | PRN
  Administered 2017-07-30: 1 mg via INTRAVENOUS

## 2017-07-30 MED ORDER — PHYTONADIONE 5 MG PO TABS
10.0000 mg | ORAL_TABLET | Freq: Once | ORAL | Status: AC
  Administered 2017-07-30: 10 mg via ORAL
  Filled 2017-07-30 (×2): qty 2

## 2017-07-30 MED ORDER — LORAZEPAM 2 MG/ML IJ SOLN
0.5000 mg | Freq: Once | INTRAMUSCULAR | Status: AC
  Administered 2017-07-30: 0.5 mg via INTRAVENOUS
  Filled 2017-07-30: qty 1

## 2017-07-30 MED ORDER — MORPHINE SULFATE (PF) 4 MG/ML IV SOLN
1.0000 mg | Freq: Once | INTRAVENOUS | Status: AC
  Administered 2017-07-30: 1 mg via INTRAVENOUS
  Filled 2017-07-30: qty 1

## 2017-07-30 MED ORDER — SERTRALINE HCL 50 MG PO TABS
25.0000 mg | ORAL_TABLET | Freq: Every day | ORAL | Status: DC
  Administered 2017-07-30 – 2017-08-01 (×3): 25 mg via ORAL
  Filled 2017-07-30 (×3): qty 1

## 2017-07-30 NOTE — Progress Notes (Signed)
FPTS Interim Progress Note  S: Patient is sleeping when into the room.  She is having deep inspiration and increased work of breathing.  Patient is hard to awaken.  Once awake she is much more altered than she was previously this morning.  O: BP (!) 132/117   Pulse (!) 112   Temp (!) 97.4 F (36.3 C) (Oral)   Resp (!) 31   Ht 5\' 6"  (1.676 m)   Wt 218 lb 7.6 oz (99.1 kg)   SpO2 (!) 89%   BMI 35.26 kg/m   General: Sleeping, confused, temporal wasting noted Respiratory: Increased work of breathing on 15 L, no breath sounds noted on right side, wheezing noted on left side Psych: Alert only to self  A/P: CCM consulted due to decompensation this afternoon.  Patient with increased work of breathing on 15 L high flow.  Gastroenterology has seen.  GI recommends increase in Lasix, increase in spironolactone, and thoracentesis.  Myson Levi, [place], DO 07/30/2017, 2:49 PM PGY-1, [place] Medicine Service pager [phone]

## 2017-07-30 NOTE — Progress Notes (Signed)
1900: Handoff report received from RN. Pt's roommate visiting. Pt resting in bed c/o 7/10 pain all over and increased WOB.   2000: Pt's roommate leaves, and pt immediately begins calling out from the room. Pt's anxiety is much better managed with someone in the room with her.  2200: Pt continues with anxiety. Calls out when left alone in the room despite frequent education on the call bell. Pt is most anxious about frequency and urgency of bowel movements r/t lactulose. Rapid response RN consulted for WOB. Advised this RN to monitor O2 saturations and call back if any decline.  0000: Pt still agitated and working very hard to breathe. O2 sats are maintained, but the pt is tiring and agitated, removing equipment, etc. Respiratory therapist paged to assess pt. RT recommending BiPAP. MD consulted, BiPAP ordered. Pt tolerated for a short time before removing equipment.  0100: Pt requiring 1:1 interventions for safety, anxiety management. Pt appears to be acting from air hunger and perineal discomfort r/t frequent bowel movements r/t lactulose.   0300: Pt with increasing agitation. RN covering lunch called for haldol and discussed concerns about pt being appropriate for the unit. BiPAP reapplied until ABGs resulted. Pt returned to nasal cannula.  0400: Pt continues to require 1:1 monitoring with redirection q46min. Anxiety has increased with each intervention.  0500: Unable to measure UOP accurately with frequency of bowel movements, but UOP does not seem consistent with that of diuretic administration. Pt bladder scanned = 550mL. Not voiding when prompted. 316mL from in and out catheter.  0700: Handoff report given to RN.

## 2017-07-30 NOTE — Progress Notes (Signed)
I spoke with Dr. Brigitte Pulse from [facility]. He advised against TIPS given her h/o encephalopathy and MELD 20.  He recommended continued aggressive diuresis, reconsideration of thoracentesis, supporting resp/oxygenation PRN.  Will follow along.

## 2017-07-30 NOTE — Progress Notes (Signed)
Pharmacy Antibiotic Note  Wendy [name] is a 56 y.o. female admitted on 07/26/2017 with AMS.  Pharmacy has been consulted for unasyn dosing for possible aspiration pneumonia. Pt is afebrile and WBC is elevated at 17. SCr is elevated at 1.49 but fairly stable.   Plan: Unasyn 3gm IV Q8H F/u renal fxn, C&S, clinical status   Height: 5\' 6"  (167.6 cm) Weight: 218 lb 7.6 oz (99.1 kg) IBW/kg (Calculated) : 59.3  Temp (24hrs), Avg:98.1 F (36.7 C), Min:97.7 F (36.5 C), Max:98.6 F (37 C)  Recent Labs  Lab 07/26/17 2042 07/27/17 0312 07/28/17 0850 07/28/17 1247 07/29/17 0351 07/30/17 0402  WBC 9.4 12.8*  --  9.1 11.3* 17.0*  CREATININE 1.80*  --  1.58*  --  1.50* 1.49*    Estimated Creatinine Clearance: 49.5 mL/min (A) (by C-G formula based on SCr of 1.49 mg/dL (H)).    Allergies  Allergen Reactions  . Codeine Phosphate Itching  . Codeine Rash    Antimicrobials this admission: Unasyn 12/7>>  Dose adjustments this admission: N/A  Microbiology results: 12/5 Peritoneal fluid - NGTD 12/5 MRSA - NEG  Thank you for allowing pharmacy to be a part of this patient's care.  Yannis Broce, Rande Lawman 07/30/2017 9:39 AM

## 2017-07-30 NOTE — Progress Notes (Signed)
CRITICAL VALUE ALERT  Critical Value:  2.8  Date & Time Notied: 07/30/17 1657  Provider Notified: [id number]  Orders Received/Actions taken: No new orders

## 2017-07-30 NOTE — Progress Notes (Signed)
Pt refusing to keep BIPAP on.  Called to room by RN for decreased O2 Sats.  O2 sats 74% on 6L nasal cannula.  Pt is very agitated.  Pt placed on 15L HFNC.  O2 sats have increased to 90%. Pt still has increased work of breathing.

## 2017-07-30 NOTE — Progress Notes (Signed)
PULMONARY / CRITICAL CARE MEDICINE   Name: Wendy [name] MRN: [id number] DOB: [birth year]    ADMISSION DATE:  07/26/2017   CHIEF COMPLAINT:  Dyspnea.  HISTORY OF PRESENT ILLNESS:   Is a 56 year old with a history of anxiety, hepatitis C, and a past history of alcohol use who was admitted with altered mental status and found to have an elevated pneumonia.  In addition she had a large right pleural effusion.  Her initially consulted for the fusion which appears to essentially represent thoracic ascites.  Diuresis was recommended however I's and O's from last night are recorded as 1890 in and 950 out in the patient today is more dyspneic.  Is a very poor historian and is not clear whether or not she has any productive cough although she has cough some during my exam with a cough not productive of sputum.  She is not having any overt fevers or chest pain.  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  PAST SURGICAL HISTORY: She  has a past surgical history that includes ORIF hip fracture (2010); Upper gastrointestinal endoscopy (08/05/2007); Splenectomy; Trudee Kuster hole for subdural hematoma (2004); Upper gastrointestinal endoscopy (10/14/2011); Colonoscopy  (08/2012); Kyphoplasty (Bilateral, 09/21/2013); Esophagogastroduodenoscopy (egd) with propofol (N/A, 01/26/2014); Nasal sinus surgery; and IR Paracentesis (07/28/2017).  Allergies  Allergen Reactions  . Codeine Phosphate Itching  . Codeine Rash    No current facility-administered medications on file prior to encounter.    Current Outpatient Medications on File Prior to Encounter  Medication Sig  . acetaminophen (TYLENOL 8 HOUR) 650 MG CR tablet Take 1 tablet (650 mg total) by mouth every 8 (eight) hours as needed for pain.  . bisacodyl ([employer] GENTLE LAXATIVE) 5 MG EC tablet TAKE 1 TABLET (5 MG TOTAL) BY MOUTH DAILY. (Patient taking differently: Take 5 mg by mouth daily. )  . CHANTIX 0.5 MG tablet TAKE 1 TABLET BY MOUTH TWICE A DAY  . clonazePAM (KLONOPIN) 1 MG tablet Take 0.5-1 tablets (0.5-1 mg total) by mouth 2 (two) times daily as needed for anxiety.  . docusate sodium (COLACE) 100 MG capsule TAKE 1 CAPSULE BY MOUTH TWICE A DAY (Patient taking differently: TAKE 100mg  BY MOUTH TWICE A DAY)  . escitalopram (LEXAPRO) 20 MG tablet Take 0.5 tablets (10 mg total) daily by mouth. Stop the 10mg  tablets. Dose increased to 20mg  today  . furosemide (LASIX) 40 MG tablet Take 2 tablets (80 mg total) by mouth daily.  Marland Kitchen gabapentin (NEURONTIN) 300 MG capsule Take 1 capsule (300 mg total) by mouth 3 (three) times daily.  . hydrOXYzine (ATARAX/VISTARIL) 25 MG tablet TAKE 1 TO 2 TABLETS BY MOUTH TWICE A DAY AS NEEDED FOR ITCHING (Patient taking differently: TAKE 25mg  to 50mg  MOUTH TWICE A DAY AS NEEDED FOR ITCHING)  . levothyroxine (SYNTHROID, LEVOTHROID) 200 MCG tablet Take 1 tablet (200 mcg total) by mouth daily before breakfast.  . loratadine (CLARITIN) 10 MG tablet TAKE 1 TABLET BY MOUTH EVERY DAY (  Patient taking differently: TAKE 10mg  BY MOUTH EVERY DAY)  . montelukast (SINGULAIR) 10 MG tablet TAKE 1 TABLET (10 MG TOTAL) BY MOUTH DAILY.  Marland Kitchen omeprazole (PRILOSEC) 20 MG capsule TAKE 1 CAPSULE (20 MG TOTAL) BY  MOUTH DAILY.  Marland Kitchen propranolol (INDERAL) 60 MG tablet TAKE 1 TABLET (60 MG TOTAL) BY MOUTH 2 (TWO) TIMES DAILY.  Marland Kitchen QUEtiapine (SEROQUEL) 25 MG tablet Take 1 tablet (25 mg total) by mouth at bedtime.  . ranitidine (ZANTAC) 300 MG tablet TAKE 1 TABLET (300 MG TOTAL) BY MOUTH AT BEDTIME.  . rifaximin (XIFAXAN) 550 MG TABS tablet Take 1 tablet (550 mg total) by mouth 2 (two) times daily.  Marland Kitchen spironolactone (ALDACTONE) 100 MG tablet TAKE 1.5 TABLETS (150 MG TOTAL) BY MOUTH DAILY.  . traMADol (ULTRAM) 50 MG tablet Take 1 tablet (50 mg total) by mouth 2 (two) times daily.  . traZODone (DESYREL) 50 MG tablet TAKE 0.5 TABLETS (25 MG TOTAL) BY MOUTH AT BEDTIME AS NEEDED FOR SLEEP.  Marland Kitchen cycloSPORINE (RESTASIS) 0.05 % ophthalmic emulsion Place 1 drop into both eyes 2 (two) times daily. (Patient not taking: Reported on 07/27/2017)  . fluticasone (FLONASE) 50 MCG/ACT nasal spray Place 2 sprays into both nostrils daily. (Patient not taking: Reported on 07/27/2017)  . lactulose (CHRONULAC) 10 GM/15ML solution Take 30 mLs (20 g total) 3 (three) times daily by mouth. Take 2 tbsp by mouth 2 times a day (Patient not taking: Reported on 07/27/2017)  . spironolactone (ALDACTONE) 100 MG tablet Take 2 tablets (200 mg total) by mouth daily. (Patient not taking: Reported on 07/27/2017)    FAMILY HISTORY:  Her indicated that her mother is deceased. She indicated that her father is alive. She indicated that her sister is deceased. She indicated that the status of her maternal grandmother is unknown. She indicated that the status of her neg hx is unknown.   SOCIAL HISTORY: She  reports that she quit smoking about 4 weeks ago. Her smoking use included cigarettes. She has a 2.50 pack-year smoking history. she has never used smokeless tobacco. She reports that she does not drink alcohol or use drugs.  REVIEW OF SYSTEMS:   Not obtainable  SUBJECTIVE:  Not obtainable  VITAL SIGNS: BP (!) 132/117   Pulse (!) 112   Temp (!) 97.4 F  (36.3 C) (Oral)   Resp (!) 31   Ht 5\' 6"  (1.676 m)   Wt 218 lb 7.6 oz (99.1 kg)   SpO2 (!) 89%   BMI 35.26 kg/m   HEMODYNAMICS:    VENTILATOR SETTINGS: FiO2 (%):  [40 %] 40 %  INTAKE / OUTPUT: I/O last 3 completed shifts: In: 2010 [P.O.:1910; IV Piggyback:100] Out: 950 [Urine:950]  PHYSICAL EXAMINATION: General: This is a middle-aged female who appears her 54 years.  She is overtly agitated and confused. Cardiovascular: S1 and S2 are regular without murmur rub or gallop. Lungs: There is decreased air movement throughout the right hemithorax.  There are no wheezes or some scattered rhonchi. Abdomen: The abdomen is obese without any overt fluid wave.  There is no organomegaly masses tenderness guarding or rebound.  There is no Caput.  No asterixis.  LABS:  BMET Recent Labs  Lab 07/28/17 0850 07/29/17 0351 07/30/17 0402  NA 135 137 136  K 4.3 3.6 3.7  CL 105 109 108  CO2 18* 19* 17*  BUN 28* 24* 21*  CREATININE 1.58* 1.50* 1.49*  GLUCOSE 106* 157* 164*    Electrolytes Recent Labs  Lab 07/28/17 0850  07/29/17 0351 07/30/17 0402  CALCIUM 8.6* 8.3* 8.6*    CBC Recent Labs  Lab 07/28/17 1247 07/29/17 0351 07/30/17 0402  WBC 9.1 11.3* 17.0*  HGB 8.6* 8.7* 9.2*  HCT 25.9* 25.9* 27.6*  PLT 280 288 249    Coag's Recent Labs  Lab 07/26/17 2042 07/30/17 0912  INR 1.69 1.94    Sepsis Markers No results for input(s): LATICACIDVEN, PROCALCITON, O2SATVEN in the last 168 hours.  ABG Recent Labs  Lab 07/27/17 0101 07/28/17 1555 07/30/17 0409  PHART 7.422 7.442 7.449  PCO2ART 29.0* 27.0* 25.3*  PO2ART 87.0 78.0* 74.1*    Liver Enzymes Recent Labs  Lab 07/28/17 0850 07/29/17 0351 07/30/17 0402  AST 79* 70* 69*  ALT 32 34 38  ALKPHOS 107 109 113  BILITOT 2.0* 1.6* 1.8*  ALBUMIN 2.0* 1.9* 2.1*    Cardiac Enzymes Recent Labs  Lab 07/27/17 0312  TROPONINI <0.03    Glucose Recent Labs  Lab 07/27/17 0450  GLUCAP 110*    Imaging No  results found.    DISCUSSION: Large pleural effusion in a patient with ascites.  This essentially represents thoracic ascites and the ideal treatment is with diuresis however as the patient is approaching respiratory extremis I am willing to do a one-time thoracentesis to relieve her discomfort.  Repeat thoracenteses are contraindicated for the same reason as large volume paracenteses is able to eventually intravascular volume depletion and even worse hypoproteinemia.  I am somewhat concerned that she may have an infiltrate underlying the effusion sent a pro-calcitonin to help with that evaluation.  Obviously there will be a post procedure chest x-ray.  Note an INR of almost 2, patient is at some increased risk for performing the procedure.   Lars Masson, MD Pulmonary and [place] Pager: [phone]  07/30/2017, 3:07 PM

## 2017-07-30 NOTE — Procedures (Signed)
Thoracentesis.  Indication: Treatment of dyspnea which has not responded to diuresis.  The patient is not competent to provide informed consent therefore the procedure was performed after 2 physicians agreed that it was medically indicated.  The patient was placed in the sitting position and the right thorax was examined with ultrasound to determine an appropriate placed position thoracentesis catheter.  The skin was then sterilely prepped with chlorhexidine and the area was draped.  A small wheal was raised on the skin with 1% lidocaine.  The skin was sharply incised.  The thoracentesis catheter was then gently Durene Fruits while aspirating and as soon as fluid was obtained the catheter was advanced off of the needle.  Approximately 1.5 L of somewhat turbid very light yellow fluid was obtained.  There was no immediate complication including no bleeding at the insertion site.    Postprocedural chest x-ray is pending.  Because the patient has an elevated lactate and not an elevated calcitonin I have added azithromycin to better cover for community-acquired pneumonia.  I am suspicious that the lactate however is on the basis of her hepatic dysfunction.  Laboratories have been sent on the pleural fluid including a cell count and differential LDH glucose and culture.  Patient is reporting subjective improvement

## 2017-07-30 NOTE — Consult Note (Signed)
Referring Provider:  Dr. Martinique, PGY-1 Primary Care Physician:  Verner Mould, MD Primary Gastroenterologist:  Dr. Carlean Purl  Reason for Consultation:  Cirrhosis, hepatic hydrothorax  HPI: Wendy [name] is a 56 y.o. female with history of hepatitis C cirrhosis (successfully treated), history of EtOH, esophageal varices, medication noncompliance, anxiety, hepatic encephalopathy, hypertension who presented 12/3 with altered mental status after unwitnessed fall.  She was admitted by teaching service and workup revealed ammonia level of 99, negative head CT.  She was started on lactulose and has had slow improvement of her mental status.  She has had ongoing issues with cirrhosis and ascites and is on diuretics as an outpatient although there is some question of compliance.  She has had increasing complaints of dyspnea over the past week and CT chest 12/5 revealed large right pleural effusion.  She follows with the liver clinic through [facility] and her last visit there was in 10/2016 at which time her MELD was only 10 so she has not been evaluated for transplant at this point.  She was supposed to return to them in September but did not do so.  MELD currently is 21.  She is currently on lasix 60 mg IV BID and spironolactone 150 mg PO daily.  Pulmonary is also caring for her and they have decided against thoracentesis for now as they usually re-accumulate quickly.  She is refusing nebulizer breathing treatments and BiPap.  Only having O2 sats in the high 80's on 6 Liters [place].  Increased WOB.   Past Medical History:  Diagnosis Date  . Allergic rhinitis   . Anxiety and depression   . Arthritis   . Ascites   . Blood transfusion   . Cataract    MILD  . Clotting disorder ([place])    prolonged clotting time due to liver disease  . Compression fracture 09/21/2013  . 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 Esophageal stricture    esophageal dysmotility and  chronic dysphagia as well  . Esophageal varices ([place])   . GERD (gastroesophageal reflux disease)   . Hepatic cirrhosis due to chronic hepatitis C infection ([place])    ETOH causative as well  . Hepatic encephalopathy syndrome ([place])   . Hiatal hernia   . History of alcohol abuse   . History of hip fracture   . HTN (hypertension)   . Osteoporosis   . 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 Paraesophageal hernia   . Paraesophageal hiatal hernia 10/17/2015  . Pleural effusion   . 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 Ulcer    2007    Past Surgical History:  Procedure Laterality Date  . BURR HOLE FOR SUBDURAL HEMATOMA  2004  . COLONOSCOPY  08/2012   moderatel left colon tics  . ESOPHAGOGASTRODUODENOSCOPY (EGD) WITH PROPOFOL N/A 01/26/2014   Procedure: ESOPHAGOGASTRODUODENOSCOPY (EGD) WITH PROPOFOL;  Surgeon: Lafayette Dragon, MD;  Location: [facility] ENDOSCOPY;  Service: Endoscopy;  Laterality: N/A;  . IR PARACENTESIS  07/28/2017  . KYPHOPLASTY Bilateral 09/21/2013   Procedure: T12 - L1 KYPHOPLASTY;  Surgeon: Melina Schools, MD;  Location: [facility];  Service: Orthopedics;  Laterality: Bilateral;  . NASAL SINUS SURGERY    . ORIF HIP FRACTURE  2010   right x2  . SPLENECTOMY     age 19  . UPPER GASTROINTESTINAL ENDOSCOPY  08/05/2007   esophageal ring, hiatal hernia, portal gastropathy  . UPPER GASTROINTESTINAL ENDOSCOPY  10/14/2011    Prior to Admission medications   Medication Sig Start Date End Date Taking? Authorizing Provider  acetaminophen (TYLENOL 8 HOUR) 650 MG CR tablet Take 1 tablet (650 mg total) by mouth every 8 (eight) hours as needed for pain. 10/27/16  Yes Mayo, Pete Pelt, MD  bisacodyl ([employer] GENTLE LAXATIVE) 5 MG EC tablet TAKE 1 TABLET (5 MG TOTAL) BY MOUTH DAILY. Patient taking differently: Take 5 mg by mouth daily.  10/26/14  Yes Karamalegos, Devonne Doughty, DO  CHANTIX 0.5 MG tablet TAKE 1 TABLET BY MOUTH TWICE A DAY 06/01/17   Yes Verner Mould, MD  clonazePAM (KLONOPIN) 1 MG tablet Take 0.5-1 tablets (0.5-1 mg total) by mouth 2 (two) times daily as needed for anxiety. 04/23/17  Yes Verner Mould, MD  docusate sodium (COLACE) 100 MG capsule TAKE 1 CAPSULE BY MOUTH TWICE A DAY Patient taking differently: TAKE 100mg  BY MOUTH TWICE A DAY 07/23/17  Yes Gatha Mayer, MD  escitalopram (LEXAPRO) 20 MG tablet Take 0.5 tablets (10 mg total) daily by mouth. Stop the 10mg  tablets. Dose increased to 20mg  today 06/29/17  Yes Patrecia Pour, MD  furosemide (LASIX) 40 MG tablet Take 2 tablets (80 mg total) by mouth daily. 05/20/17  Yes Gatha Mayer, MD  gabapentin (NEURONTIN) 300 MG capsule Take 1 capsule (300 mg total) by mouth 3 (three) times daily. 12/16/16  Yes Mikell, Jeani Sow, MD  hydrOXYzine (ATARAX/VISTARIL) 25 MG tablet TAKE 1 TO 2 TABLETS BY MOUTH TWICE A DAY AS NEEDED FOR ITCHING Patient taking differently: TAKE 25mg  to 50mg  MOUTH TWICE A DAY AS NEEDED FOR ITCHING 06/01/17  Yes Verner Mould, MD  levothyroxine (SYNTHROID, LEVOTHROID) 200 MCG tablet Take 1 tablet (200 mcg total) by mouth daily before breakfast. 06/22/16  Yes Verner Mould, MD  loratadine (CLARITIN) 10 MG tablet TAKE 1 TABLET BY MOUTH EVERY DAY Patient taking differently: TAKE 10mg  BY MOUTH EVERY DAY 04/20/17  Yes Verner Mould, MD  montelukast (SINGULAIR) 10 MG tablet TAKE 1 TABLET (10 MG TOTAL) BY MOUTH DAILY. 04/09/17  Yes Kozlow, Donnamarie Poag, MD  omeprazole (PRILOSEC) 20 MG capsule TAKE 1 CAPSULE (20 MG TOTAL) BY MOUTH DAILY. 12/08/16  Yes Verner Mould, MD  propranolol (INDERAL) 60 MG tablet TAKE 1 TABLET (60 MG TOTAL) BY MOUTH 2 (TWO) TIMES DAILY. 07/19/17  Yes Verner Mould, MD  QUEtiapine (SEROQUEL) 25 MG tablet Take 1 tablet (25 mg total) by mouth at bedtime. 01/22/17  Yes Merian Capron, MD  ranitidine (ZANTAC) 300 MG tablet TAKE 1 TABLET (300 MG TOTAL) BY MOUTH AT BEDTIME.  07/19/17  Yes Gatha Mayer, MD  rifaximin (XIFAXAN) 550 MG TABS tablet Take 1 tablet (550 mg total) by mouth 2 (two) times daily. 06/18/15  Yes Gatha Mayer, MD  spironolactone (ALDACTONE) 100 MG tablet TAKE 1.5 TABLETS (150 MG TOTAL) BY MOUTH DAILY. 07/02/17  Yes Gatha Mayer, MD  traMADol (ULTRAM) 50 MG tablet Take 1 tablet (50 mg total) by mouth 2 (two) times daily. 06/10/17  Yes Verner Mould, MD  traZODone (DESYREL) 50 MG tablet TAKE 0.5 TABLETS (25 MG TOTAL) BY MOUTH AT BEDTIME AS NEEDED FOR SLEEP. 06/07/17  Yes Verner Mould, MD  cycloSPORINE (RESTASIS) 0.05 % ophthalmic emulsion Place 1 drop into both eyes 2 (two) times daily. Patient not taking: Reported on 07/27/2017 04/28/16   Rogue Bussing, MD  fluticasone [facility]) 50 MCG/ACT nasal spray Place 2 sprays into both nostrils  daily. Patient not taking: Reported on 07/27/2017 04/28/16   Rogue Bussing, MD  lactulose (CHRONULAC) 10 GM/15ML solution Take 30 mLs (20 g total) 3 (three) times daily by mouth. Take 2 tbsp by mouth 2 times a day Patient not taking: Reported on 07/27/2017 06/29/17   Patrecia Pour, MD  spironolactone (ALDACTONE) 100 MG tablet Take 2 tablets (200 mg total) by mouth daily. Patient not taking: Reported on 07/27/2017 06/18/17   Gatha Mayer, MD    Current Facility-Administered Medications  Medication Dose Route Frequency Provider Last Rate Last Dose  . Ampicillin-Sulbactam (UNASYN) 3 g in sodium chloride 0.9 % 100 mL IVPB  3 g Intravenous Q8H Rumbarger, Rachel L, RPH 200 mL/hr at 07/30/17 0953 3 g at 07/30/17 0953  . enoxaparin (LOVENOX) injection 40 mg  40 mg Subcutaneous Q24H Enid Derry, Martinique, DO   40 mg at 07/29/17 2243  . furosemide (LASIX) injection 60 mg  60 mg Intravenous BID Lucila Maine C, DO   60 mg at 07/30/17 0900  . hydrOXYzine (ATARAX/VISTARIL) tablet 12.5 mg  12.5 mg Oral TID PRN Lucila Maine C, DO      . ipratropium-albuterol (DUONEB) 0.5-2.5 (3) MG/3ML  nebulizer solution 3 mL  3 mL Nebulization Q6H Shirley, Martinique, DO   3 mL at 07/30/17 0114  . levothyroxine (SYNTHROID, LEVOTHROID) tablet 200 mcg  200 mcg Oral QAC breakfast Enid Derry, Martin[place], DO   200 mcg at 07/30/17 0900  . lidocaine (XYLOCAINE) 2 % (with pres) injection   Infiltration PRN Saverio Danker, PA-C   10 mL at 07/28/17 0959  . MEDLINE mouth rinse  15 mL Mouth Rinse BID Alveda Reasons, MD   15 mL at 07/30/17 0945  . prochlorperazine (COMPAZINE) tablet 5 mg  5 mg Oral Q6H PRN Shirley, Martinique, DO   5 mg at 07/29/17 2355  . rifaximin (XIFAXAN) tablet 550 mg  550 mg Oral BID Shirley, Martinique, DO   550 mg at 07/30/17 0942  . sertraline (ZOLOFT) tablet 25 mg  25 mg Oral Daily Enid Derry, Martinique, DO   25 mg at 07/30/17 0942  . spironolactone (ALDACTONE) tablet 150 mg  150 mg Oral Daily Enid Derry, Martinique, DO   150 mg at 07/30/17 0942  . traMADol (ULTRAM) tablet 50 mg  50 mg Oral Q8H PRN Lucila Maine C, DO   50 mg at 07/29/17 1626  . zolpidem (AMBIEN) tablet 5 mg  5 mg Oral QHS PRN Lucila Maine C, DO   5 mg at 07/29/17 2242    Allergies as of 07/26/2017 - Review Complete 07/26/2017  Allergen Reaction Noted  . Codeine phosphate Itching 12/24/2005  . Codeine Rash 06/29/2014    Family History  Problem Relation Age of Onset  . Heart disease Mother   . Anxiety disorder Mother   . Other Daughter        chromosome abnormalit  . Other Daughter        micropthalmia  . Cancer Maternal Grandmother   . Kidney failure Sister   . Colon cancer Neg Hx   . Esophageal cancer Neg Hx   . Rectal cancer Neg Hx   . Stomach cancer Neg Hx     Social History   Socioeconomic History  . Marital status: Widowed    Spouse name: Not on file  . Number of children: 2  . Years of education: 12+  . Highest education level: Not on file  Social Needs  . Financial resource strain: Not on file  . Food  insecurity - worry: Not on file  . Food insecurity - inability: Not on file  . Transportation needs -  medical: Not on file  . Transportation needs - non-medical: Not on file  Occupational History  . Occupation: Disabled  Tobacco Use  . Smoking status: Former Smoker    Packs/day: 0.25    Years: 10.00    Pack years: 2.50    Types: Cigarettes    Last attempt to quit: 06/29/2017    Years since quitting: 0.0  . Smokeless tobacco: Never Used  . Tobacco comment: reduce # of cigarettes  Substance and Sexual Activity  . Alcohol use: No    Alcohol/week: 0.0 oz    Comment: history of attending AA  . Drug use: No  . Sexual activity: No  Other Topics Concern  . 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  She quit secondary to her liver disease and has been sober for several years.  She denies the use of drugs - prescription or otherwise.     She does not drive.   She lives in [place].    She has an associates degree.    Many of her bills are paid automatically by the bank.  Her SSI checks deposit directly into her banck account.    Patient lives with a female friend in a house that she owns.    Her friend manages her medications for her using a pill box.       Updated 08/27/16    Review of Systems: ROS is O/W negative except as mentioned in HPI.  Physical Exam: Vital signs in last 24 hours: Temp:  [97.7 F (36.5 C)-98.6 F (37 C)] 97.9 F (36.6 C) (12/07 0741) Pulse Rate:  [92-110] 109 (12/07 0741) Resp:  [15-32] 15 (12/07 0741) BP: (96-151)/(68-110) 140/83 (12/07 0741) SpO2:  [89 %-100 %] 89 % (12/07 0957) FiO2 (%):  [40 %] 40 % (12/07 0114) Last BM Date: 07/29/17 General: Alert, Well-developed, well-nourished, in respiratory distress with significant WOB. Head:  Normocephalic and atraumatic. Eyes:  Sclera clear, no icterus.  Conjunctiva pink. Ears:  Normal auditory acuity. Mouth:  No deformity or lesions.   Lungs:  Breathing is labored with diminished BS on the right. Heart:  Tachy.  No  murmurs noted. Abdomen:  Soft, some distention from ascites fluid.  BS present.  Non-tender.  Scar noted on upper abdomen. Rectal:  Deferred  Msk:  Symmetrical without gross deformities. Pulses:  Normal pulses noted. Extremities:  Without clubbing or edema. Neurologic:  Alert and oriented x 4;  grossly normal neurologically. Skin:  Intact without significant lesions or rashes. Psych:  Alert and cooperative. Normal mood and affect.  Intake/Output from previous day: 12/06 0701 - 12/07 0700 In: 1890 [P.O.:1790; IV Piggyback:100] Out: 950 [Urine:950]  Lab Results: Recent Labs    07/28/17 1247 07/29/17 0351 07/30/17 0402  WBC 9.1 11.3* 17.0*  HGB 8.6* 8.7* 9.2*  HCT 25.9* 25.9* 27.6*  PLT 280 288 249   BMET Recent Labs    07/28/17 0850 07/29/17 0351 07/30/17 0402  NA 135 137 136  K 4.3 3.6 3.7  CL 105 109 108  CO2 18* 19* 17*  GLUCOSE 106* 157* 164*  BUN 28* 24* 21*  CREATININE 1.58* 1.50* 1.49*  CALCIUM 8.6* 8.3* 8.6*   LFT Recent Labs    07/30/17 0402  PROT 7.4  ALBUMIN 2.1*  AST 69*  ALT 38  ALKPHOS 113  BILITOT 1.8*   PT/INR Recent Labs    07/30/17 0912  LABPROT 22.0*  INR 1.94   Studies/Results: Dg Chest 2 View  Result Date: 07/28/2017 CLINICAL DATA:  Shortness of breath, cough. EXAM: CHEST  2 VIEW COMPARISON:  Radiograph July 26, 2017. FINDINGS: The heart size and mediastinal contours are within normal limits. No pneumothorax is noted. Left lung is clear. Moderate right pleural effusion is noted with probable underlying atelectasis or infiltrate. The visualized skeletal structures are unremarkable. IMPRESSION: Moderate right pleural effusion with underlying atelectasis or infiltrate. Electronically Signed   By: Marijo Conception, M.D.   On: 07/28/2017 16:41   Ct Chest W Contrast  Result Date: 07/28/2017 CLINICAL DATA:  Pleural effusion.  Chronic cough. EXAM: CT CHEST WITH CONTRAST TECHNIQUE: Multidetector CT imaging of the chest was performed during  intravenous contrast administration. CONTRAST:  33mL ISOVUE-300 IOPAMIDOL (ISOVUE-300) INJECTION 61% COMPARISON:  Chest radiograph 07/28/2017 FINDINGS: Cardiovascular: Normal heart size. No pericardial effusion. Heavy calcific atherosclerotic disease of the coronary arteries and the milder atherosclerotic disease of the thoracic aorta. The mediastinal structures are displaced to the left due to presence of large right pleural effusion. Mediastinum/Nodes: No enlarged mediastinal, hilar, or axillary lymph nodes. Thyroid gland, trachea, and esophagus demonstrate no significant findings. Lungs/Pleura: Large water density right pleural effusion. There is airspace consolidation versus atelectasis in the right upper lobe. Near complete collapse of the right middle lobe. Partial collapse of segmental branches of the right lower lobe. Mild emphysematous changes in the lung apices. Patchy airspace consolidation peripherally in the left upper lobe. Upper Abdomen: Cirrhotic appearance of the liver. Small amount of upper abdominal ascites. Cholelithiasis. Soft tissue edema of the abdominal wall. Musculoskeletal: No chest wall abnormality. No acute or significant osseous findings. IMPRESSION: Large water density right pleural effusion with atelectatic changes in the right upper lobe, near complete collapse of the right middle lobe and segmental collapse of the right lower lobe. Possible superimposed airspace consolidation in the right upper lobe. Mediastinal structures are displaced to the left due to the large right pleural effusion. Patchy peripheral airspace consolidation in the left upper lobe. Advanced for age marked calcific atherosclerotic disease of the coronary arteries. Aortic Atherosclerosis (ICD10-I70.0) and Emphysema (ICD10-J43.9). Cirrhotic liver with small amount of ascites. Cholelithiasis. Chest wall/abdominal wall edema. Electronically Signed   By: Fidela Salisbury M.D.   On: 07/28/2017 23:58   Ir  Paracentesis  Result Date: 07/28/2017 INDICATION: History of alcoholic cirrhosis with hepatic that the. Request is made for diagnostic and therapeutic paracentesis. EXAM: ULTRASOUND GUIDED DIAGNOSTIC AND THERAPEUTIC PARACENTESIS MEDICATIONS: 2% lidocaine COMPLICATIONS: None immediate. PROCEDURE: Emergency written consent was obtained from the IR as well as the patient's attending physician after a discussion of the risks, benefits and alternatives to treatment. A timeout was performed prior to the initiation of the procedure. Initial ultrasound scanning demonstrates a moderate amount of ascites within the right upper abdominal quadrant. The right upper abdomen was prepped and draped in the usual sterile fashion. 2% lidocaine was used for local anesthesia. Following this, a 19 gauge, 15-cm, Yueh catheter was introduced. An ultrasound image was saved for documentation purposes. The paracentesis was performed. The catheter was removed and a dressing was applied. The patient tolerated the procedure well without immediate post procedural complication. FINDINGS: A total of approximately 3.6 L of serous fluid was removed. Samples were sent to the laboratory as requested by the clinical team. IMPRESSION: Successful ultrasound-guided paracentesis yielding 3.6 liters of  peritoneal fluid. Read by: Saverio Danker, PA-C Electronically Signed   By: Sandi Mariscal M.D.   On: 07/28/2017 11:34   IMPRESSION: *56 year old female with Hep C cirrhosis (has been successfully treated) with history of ascites that has been managed with diuretics and HE on lactulose and xifaxan.  MELD earlier this year as 10 and is now 57.  Presenting with SOB and what appears to be a large hepatic hydrothorax.  Pulmonary is caring for the patient and has recommended again thoracentesis for now as they usually re-accumulate quickly.  She is s/p paracentesis on 12/5 with 3.6 Liters removed, negative for SBP.  PLAN: *Continue aggressive diuresis.  Will  increase spironolactone to 300 mg daily and continue with lasix 120 mg IV daily. *May need to reconsider thoracentesis for comfort and assistance with breathing as she is refusing Bipap. *Strict I's and O's to evaluate how well she is diuresing. *TIPS is not an option with her due to history of HE.  *Monitor renal function, electrolytes, LFT's, coags, etc.   Janett Billow D. Zehr  07/30/2017, 10:37 AM  Pager number [phone]   ________________________________________________________________________  Velora Heckler GI MD note:  I personally examined the patient, reviewed the data and agree with the assessment and plan described above.  Over the past 2-3 months she's had increasing issues with edema, fluid status.  Currently she has large right pleural effusion. She is tachypnic on high flow [place].  The floor team is assessing her now.  I reviewed pulm notes, I do not think she is a candidate for TIPS because of her history of encephalopathy.  I have reached out to [place] and am awaiting call back for their guidance (TIPS? Thoracentesis? Transfer?).  For now we have ordered for increase in her aldactone to 300mg  daily and will increase her lasix a bit more as well (to 80mg  IV BID).  Critical care is going to reassess her as well.   Owens Loffler, MD [facility] Pager [phone]

## 2017-07-30 NOTE — Progress Notes (Signed)
Patient in distress at this time, however, is refusing BiPAP even after multiple attempts to explain to her that it will help with her work of breathing. She also took neb off as soon as I placed it on her. RN aware.

## 2017-07-31 ENCOUNTER — Other Ambulatory Visit: Payer: Self-pay | Admitting: Internal Medicine

## 2017-07-31 ENCOUNTER — Inpatient Hospital Stay (HOSPITAL_COMMUNITY): Payer: Medicare Other

## 2017-07-31 ENCOUNTER — Other Ambulatory Visit: Payer: Self-pay | Admitting: Allergy and Immunology

## 2017-07-31 ENCOUNTER — Other Ambulatory Visit (HOSPITAL_COMMUNITY): Payer: Self-pay | Admitting: Psychiatry

## 2017-07-31 DIAGNOSIS — J948 Other specified pleural conditions: Secondary | ICD-10-CM

## 2017-07-31 LAB — COMPREHENSIVE METABOLIC PANEL
ALT: 37 U/L (ref 14–54)
ANION GAP: 10 (ref 5–15)
AST: 84 U/L — ABNORMAL HIGH (ref 15–41)
Albumin: 1.8 g/dL — ABNORMAL LOW (ref 3.5–5.0)
Alkaline Phosphatase: 90 U/L (ref 38–126)
BUN: 23 mg/dL — ABNORMAL HIGH (ref 6–20)
CALCIUM: 8.1 mg/dL — AB (ref 8.9–10.3)
CHLORIDE: 106 mmol/L (ref 101–111)
CO2: 19 mmol/L — ABNORMAL LOW (ref 22–32)
Creatinine, Ser: 1.52 mg/dL — ABNORMAL HIGH (ref 0.44–1.00)
GFR, EST AFRICAN AMERICAN: 43 mL/min — AB (ref >60)
GFR, EST NON AFRICAN AMERICAN: 37 mL/min — AB (ref >60)
Glucose, Bld: 78 mg/dL (ref 65–99)
Potassium: 3.5 mmol/L (ref 3.5–5.1)
Sodium: 135 mmol/L (ref 135–145)
Total Bilirubin: 2 mg/dL — ABNORMAL HIGH (ref 0.3–1.2)
Total Protein: 5.9 g/dL — ABNORMAL LOW (ref 6.5–8.1)

## 2017-07-31 LAB — PROCALCITONIN: PROCALCITONIN: 0.27 ng/mL

## 2017-07-31 MED ORDER — LACTULOSE 10 GM/15ML PO SOLN
20.0000 g | Freq: Two times a day (BID) | ORAL | Status: DC
Start: 2017-07-31 — End: 2017-08-03
  Administered 2017-07-31 – 2017-08-01 (×3): 20 g via ORAL
  Filled 2017-07-31 (×3): qty 30

## 2017-07-31 MED ORDER — IPRATROPIUM-ALBUTEROL 0.5-2.5 (3) MG/3ML IN SOLN
3.0000 mL | Freq: Three times a day (TID) | RESPIRATORY_TRACT | Status: DC
  Administered 2017-07-31 – 2017-08-01 (×5): 3 mL via RESPIRATORY_TRACT
  Filled 2017-07-31 (×4): qty 3

## 2017-07-31 MED ORDER — PHENOL 1.4 % MT LIQD
1.0000 | OROMUCOSAL | Status: DC | PRN
  Administered 2017-08-01: 1 via OROMUCOSAL
  Filled 2017-07-31: qty 177

## 2017-07-31 NOTE — Progress Notes (Signed)
1900: Handoff report received. Pt undergoing thoracentesis without incident. Endorses improvement in work of breathing. Respiratory rate in the 20s from the 30s. Pt is much less agitated compared to the previous shift, possibly r/t respiratory effort over time. Report given to sitter.  2200: Pt continues resting tonight with minimal agitation. Pt only agitated when O2 therapy is removed, and agitation resolves with O2 sats.  2300: Report given to new sitter.  0000: Pt continues resting comfortably and has periods of sleep. Tolerating high flow nasal cannula (HFNC) and maintaining O2 sats.  0300: While relieving the sitter for a break, the pt had increasing confusion, trying to get oob. Denies need for toileting. Wrists dressed in foam, bilateral wrist restraints and mitts replaced. Rapid response RN moved IV. Respiratory therapist assessed and BiPAP replaced, but pt with increasing agitation; high flow nasal cannula restarted shortly after.  0400: Pt compliant with restraints. Resting comfortably. Tolerating HFNC well. No UOP x 7 hours. Total UOP 262mL. Bladder scan = 274mL. MD (teaching service) aware. No new orders.  0600: Pt perseverating on getting oob. Denies need for toileting. Maintaining O2 sats on 10L HFNC, but WOB increased. Returned to 15L.  0700: Handoff report given to RN.

## 2017-07-31 NOTE — Progress Notes (Signed)
1900: Handoff report received from RN. Pt sitting at the bedside comfortably. Pleasantly confused.  2200: Pt up to bedside commode frequently after most recent lactulose dose.   2300: Pt with O2 sats holding in the high 80s after getting up to the bedside commode. WOB is the same as it has been all shift. Anxiety and LOC are also the same as they have been. Pt subjectively reports SOB. Pt placed on BiPAP @ 40% O2, but O2 sats only reach 92% (previous BiPAP trials have resulted in O2 sats of 100% but increased agitation). Pt is tolerating BiPAP much better tonight than previous trials. SWOT RN consulted and assessing pt. Rapid Response RN consulted and made aware of pt. Will come to assess. Respiratory Therapist consulted and came to assess pt, BiPAP settings. O2 sats improved on BiPAP @ 50% O2.  0000: Pt continues resting quietly. Sitter reports that pt has not slept.  0100: Pt placed on HFNC for bed change after bowel movement. O2 sats again dropped to the high 80s and remained until BiPAP resumed.  Pt's friend, Nevin Bloodgood, called for an update. She expressed concern over decision-making once palliative care becomes involved. Pt has an adult daughter, Nira Conn, who is estranged. Pt's father, Lanny Hurst, is alive, but not involved in her care. Pt also has a roommate, Sandrea Matte, who visits daily.  0200: IV access leaking. IV team consulted.   0300: Pt placed on HFNC for a break from the BiPAP mask. Maintaining O2 sats.  0500: BiPAP resumed after pt got up to bedside commode with increased WOB and worsening O2 sats.  0600: Pt with some increase in anxiety, but still tolerating the BiPAP much better than previous shifts.  0700: Handoff report given to RN.

## 2017-07-31 NOTE — Progress Notes (Signed)
PULMONARY / CRITICAL CARE MEDICINE   Name: Wendy STEINMILLER MRN: [id number] DOB: 1961/07/29 PCP Verner Mould, MD LOS 4 as of 07/31/2017     ADMISSION DATE:  07/26/2017  :   BRIEF Rt hepartic hydrothoraxi  EVENTs 07/26/2017 - admit 07/30/17 - rt thorachetnesis. 1.5L turbid fluid due to resp distress. Elevated pct/lactate and abx added. GI also recommends diuresis. Noted as confused   SUBJECTIVE/OVERNIGHT/INTERVAL HX 12/8 - fluid is transudate . No evidence of SBP - spontaneous bacterial pleuritis 9% polys for total 378 sccls. MELD score today 21 - 20% 3 month mortality, CHILD CLASS 07/31/2017 - C with 12 points wih life expectancy 1-3 years. Per GI given MELD 20 - [facility] advised against TIPS  VITAL SIGNS: BP 110/71 (BP Location: Right Arm)   Pulse (!) 101   Temp (!) 97.4 F (36.3 C) (Axillary)   Resp (!) 23   Ht 5\' 6"  (1.676 m)   Wt 99.1 kg (218 lb 7.6 oz)   SpO2 93%   BMI 35.26 kg/m   HEMODYNAMICS:    VENTILATOR SETTINGS:    INTAKE / OUTPUT: I/O last 3 completed shifts: In: 2120 [P.O.:1820; IV Piggyback:300] Out: 550 [Urine:550]     EXAM  General Appearance:    Looks chronically unwell. Sitting in chair  Head:    Normocephalic, without obvious abnormality, atraumatic  Eyes:    PERRL - yes, conjunctiva/corneas - mild jaundice +      Ears:    Normal external ear canals, both ears  Nose:   NG tube - No but has 8L [place] opn  Throat:  ETT TUBE - no , OG tube - no  Neck:   Supple,  No enlargement/tenderness/nodules     Lungs:     Clear to auscultation bilaterally,  Chest wall:    No deformity  Heart:    S1 and S2 normal, no murmur, CVP - no.  Pressors - no  Abdomen:     Soft, no masses, no organomegaly  Genitalia:    Not done  Rectal:   not done  Extremities:   Extremities- edema mild +     Skin:   Intact in exposed areas but has bruises     Neurologic:   Sedation - non -> RASS - +1 . Moves all 4s - yes, sitting ablt to say hi. CAM-ICU - +ve for  delirum . Orientation - partially oriented. No asterixis       LABS  PULMONARY Recent Labs  Lab 07/27/17 0101 07/28/17 1555 07/30/17 0409  PHART 7.422 7.442 7.449  PCO2ART 29.0* 27.0* 25.3*  PO2ART 87.0 78.0* 74.1*  HCO3 18.9* 18.4* 17.3*  TCO2 20* 19*  --   O2SAT 97.0 96.0 94.6    CBC Recent Labs  Lab 07/28/17 1247 07/29/17 0351 07/30/17 0402  HGB 8.6* 8.7* 9.2*  HCT 25.9* 25.9* 27.6*  WBC 9.1 11.3* 17.0*  PLT 280 288 249    COAGULATION Recent Labs  Lab 07/26/17 2042 07/30/17 0912  INR 1.69 1.94    CARDIAC   Recent Labs  Lab 07/27/17 0312  TROPONINI <0.03   No results for input(s): PROBNP in the last 168 hours.   CHEMISTRY Recent Labs  Lab 07/26/17 2042 07/28/17 0850 07/29/17 0351 07/30/17 0402 07/31/17 0318  NA 130* 135 137 136 135  K 4.5 4.3 3.6 3.7 3.5  CL 101 105 109 108 106  CO2 18* 18* 19* 17* 19*  GLUCOSE 141* 106* 157* 164* 78  BUN 31* 28*  24* 21* 23*  CREATININE 1.80* 1.58* 1.50* 1.49* 1.52*  CALCIUM 8.6* 8.6* 8.3* 8.6* 8.1*   Estimated Creatinine Clearance: 48.5 mL/min (A) (by C-G formula based on SCr of 1.52 mg/dL (H)).   LIVER Recent Labs  Lab 07/26/17 2042 07/28/17 0850 07/29/17 0351 07/30/17 0402 07/30/17 0912 07/31/17 0318  AST 67* 79* 70* 69*  --  84*  ALT 28 32 34 38  --  37  ALKPHOS 130* 107 109 113  --  90  BILITOT 1.8* 2.0* 1.6* 1.8*  --  2.0*  PROT 7.3 6.8 6.9 7.4  --  5.9*  ALBUMIN 2.1* 2.0* 1.9* 2.1*  --  1.8*  INR 1.69  --   --   --  1.94  --      INFECTIOUS Recent Labs  Lab 07/30/17 1529 07/30/17 1844 07/31/17 0318  LATICACIDVEN 2.8* 2.7*  --   PROCALCITON 0.21  --  0.27     ENDOCRINE CBG (last 3)  No results for input(s): GLUCAP in the last 72 hours.      IMAGING x48h  - image(s) personally visualized  -   highlighted in bold Dg Chest Port 1 View  Result Date: 07/30/2017 CLINICAL DATA:  Status post right thoracentesis today. EXAM: PORTABLE CHEST 1 VIEW COMPARISON:  Single-view  of the chest earlier today. FINDINGS: Right pleural effusion is decreased after thoracentesis. No pneumothorax. Lung volumes are lower than on the comparison examination. Airspace disease throughout the left chest persists. Increased opacity in the left chest is likely due to lower lung volumes. IMPRESSION: Negative for pneumothorax after thoracentesis. No change in airspace disease throughout the left chest. Electronically Signed   By: Inge Rise M.D.   On: 07/30/2017 19:41   Dg Chest Port 1 View  Result Date: 07/30/2017 CLINICAL DATA:  Hypoxemia. EXAM: PORTABLE CHEST 1 VIEW COMPARISON:  Radiographs and CT scan of July 28, 2017. FINDINGS: The heart size and mediastinal contours are within normal limits. Increased interstitial densities are noted throughout both lungs concerning for pulmonary edema. Large right pleural effusion is noted with probable associated atelectasis. No pneumothorax is noted. The visualized skeletal structures are unremarkable. IMPRESSION: Increased interstitial densities are noted throughout both lungs concerning for pulmonary edema. Large right pleural effusion is noted with associated atelectasis. Electronically Signed   By: Marijo Conception, M.D.   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  High CHILD PUGH > 10 precludes TIPS due to high mortality and worsening enceph risk Poor prognostic sign overall No evidence of SBP  Plan - agree no TIPS until Child score is < 10 and hepatic hydrothorax persists - Rx with lasix and aldactone (uptodate has max dose) - repeated thora for emergency relieft of dyspnea + eval for SBP(leuritis) - can be done by IR - no chest tube ever - o2 for comfort - DNR/DNI with full medical carewould be appropriate - agree with palliative consult  PCCM will sign off      FAMILY  - Updates: 07/31/2017 --> resident FPTS at [place] family meet or Palliative Care meeting due by:  DAy 7. Current LOS is LOS 4 days  CODE STATUS    Code Status Orders  (From admission, onward)        Start     Ordered   07/27/17 0159  Full code  Continuous     07/27/17 0158  Code Status History    Date Active Date Inactive Code Status Order ID Comments User Context   06/26/2017 20:52 06/29/2017 20:05 Full Code [id number]  Florencia Reasons, MD Inpatient   01/25/2014 02:46 01/26/2014 22:29 Full Code [id number]  Montez Morita, MD Inpatient   09/21/2013 17:50 09/22/2013 17:42 Full Code [id number]  Melina Schools, MD Inpatient        DISPO If DNR can go to med surg      Dr. Brand Males, M.D., [facility].C.P Pulmonary and Critical Care Medicine Staff Physician, [place] Director - Interstitial Lung Disease  Program  Pulmonary [place] [facility] [place], [place], [address]  Pager: [phone], If no answer or between  15:00h - 7:00h: call [phone] Telephone: [phone]

## 2017-07-31 NOTE — Assessment & Plan Note (Signed)
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

## 2017-07-31 NOTE — Progress Notes (Signed)
[facility] Progress Note  CC:  Hepatic hydrothorax  Subjective:  Feeling a little better, breathing a little easier since 1.5 L thoracentesis.  Still with some confusion per nursing staff.  Nurse reports 200 cc of urine this AM.  Objective:  Vital signs in last 24 hours: Temp:  [97.4 F (36.3 C)-98 F (36.7 C)] 98 F (36.7 C) (12/08 0800) Pulse Rate:  [83-112] 101 (12/08 0500) Resp:  [18-32] 23 (12/08 0500) BP: (104-132)/(71-117) 110/71 (12/08 0400) SpO2:  [87 %-100 %] 93 % (12/08 0500) Last BM Date: 07/30/17 General:  Alert, chronically ill-appearing, in NAD Heart:  Regular rate and rhythm; no murmurs Pulm:  Diminished breath sounds on the right.  Some crackles noted on the left anteriorly.  Still with some increased WOB but improved from yesterday. Abdomen:  Soft, distended with some ascites fluid.  BS present.  Non-tender. Extremities:  Without edema. Neurologic:  Alert and oriented x 4;  grossly normal neurologically.  Lab Results: Recent Labs    07/28/17 1247 07/29/17 0351 07/30/17 0402  WBC 9.1 11.3* 17.0*  HGB 8.6* 8.7* 9.2*  HCT 25.9* 25.9* 27.6*  PLT 280 288 249   BMET Recent Labs    07/29/17 0351 07/30/17 0402 07/31/17 0318  NA 137 136 135  K 3.6 3.7 3.5  CL 109 108 106  CO2 19* 17* 19*  GLUCOSE 157* 164* 78  BUN 24* 21* 23*  CREATININE 1.50* 1.49* 1.52*  CALCIUM 8.3* 8.6* 8.1*   LFT Recent Labs    07/31/17 0318  PROT 5.9*  ALBUMIN 1.8*  AST 84*  ALT 37  ALKPHOS 90  BILITOT 2.0*   PT/INR Recent Labs    07/30/17 0912  LABPROT 22.0*  INR 1.94   Dg Chest Port 1 View  Result Date: 07/30/2017 CLINICAL DATA:  Status post right thoracentesis today. EXAM: PORTABLE CHEST 1 VIEW COMPARISON:  Single-view of the chest earlier today. FINDINGS: Right pleural effusion is decreased after thoracentesis. No pneumothorax. Lung volumes are lower than on the comparison examination. Airspace disease throughout the left chest persists.  Increased opacity in the left chest is likely due to lower lung volumes. IMPRESSION: Negative for pneumothorax after thoracentesis. No change in airspace disease throughout the left chest. Electronically Signed   By: Inge Rise M.D.   On: 07/30/2017 19:41   Dg Chest Port 1 View  Result Date: 07/30/2017 CLINICAL DATA:  Hypoxemia. EXAM: PORTABLE CHEST 1 VIEW COMPARISON:  Radiographs and CT scan of July 28, 2017. FINDINGS: The heart size and mediastinal contours are within normal limits. Increased interstitial densities are noted throughout both lungs concerning for pulmonary edema. Large right pleural effusion is noted with probable associated atelectasis. No pneumothorax is noted. The visualized skeletal structures are unremarkable. IMPRESSION: Increased interstitial densities are noted throughout both lungs concerning for pulmonary edema. Large right pleural effusion is noted with associated atelectasis. Electronically Signed   By: Marijo Conception, M.D.   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  MELD earlier this year as 10 and is now 93.  Presenting with SOB and what appears to be a large hepatic hydrothorax.  Pulmonary is caring for the patient and initially recommended against thoracentesis as they usually re-accumulate quickly.  Due to declining respiratory status they reconsidered and performed thoracentesis with 1.5 Liters removed  on 12/7.  She is s/p paracentesis on 12/5 with 3.6 Liters removed, negative for SBP.  PLAN: *Continue aggressive diuresis with spironolactone to 300 mg daily and continue with lasix 160 mg IV daily. *Strict I's and O's to evaluate how well she is diuresing.  Recommend a foley to help with urine output monitoring. *TIPS is not an option with her due to history of HE and MELD 20.  *Monitor renal  function, electrolytes, LFT's, coags, etc. *Follow-up results of chest x-ray that has been ordered for today.    LOS: 4 days   Laban Emperor. Zehr  07/31/2017, 8:51 AM  Pager number [phone]  ________________________________________________________________________  Vel[facility] Heckler GI MD note:  I personally examined the patient, reviewed the data and agree with the assessment and plan described above.  Clearly breathing easier since thoracentesis, not normal however.  Need to continue aggressive diuresis (increased to lasix 80 bid and aldactone 300 daily yesterday).  A foley will be helpful to monitor her urine output. Will leave that to primary team to decide.  TIPS is not an option (verified with [facility]) due to her high MELD and encephalopathy.  If no real success after another 2-3 days of aggressive diuresis, will re-contact [facility] about ? Transfer for inpatient transplant evaluation at their facility. She has a history of non-compliance that may complicate things however.   Owens Loffler, MD [facility] Pager [phone]

## 2017-07-31 NOTE — Progress Notes (Signed)
Family Medicine Teaching Service Daily Progress Note Intern Pager: [phone]  Patient name: Wendy [name] Medical record number: [id number] Date of birth: 12/29/1960 Age: 56 y.o. Gender: female  Primary Care Provider: Verner Mould, MD Consultants: IR Code Status: Full  Pt Overview and Major Events to Date:  Wendy [name] is a 56 y.o. female presenting with AMS and an unwitnessed fall . 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 and past alcohol use, Esophageal varices, non-compliance with medications, anxiety, panic disorder, frequent falls, HTN, GERD, and Obesity  Assessment and Plan:  AMS/unwitnessed fall: AMS Improved. I have not personally seen her since admission but from that time she is much improved. Patient has history of cirrhosis and hepatic encephalopathy with ammonia level of 99 at admission. She has history of non-compliance with medication. Likely hepatic encephalopathy. -Home Lactulose 30g BID- restart at lower dose of 20mg  BID -neuro checks q4 hour  -Regular diet -IR perform paracentesis and removed 3.8 L of fluid which was not suggestive of SBP - Cont Unasyn; started (12/7-) for possible aspiration pneumonia as WBC increased to 17  R. Pleural effusion: CT chest with contrast obtained which showed large water density right pleural effusion with atelectatic changes in the right upper lobe, near complete collapse of the right middle lobe and segmental collapse of the right lower lobe. 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  She misses most of her doctor's appointments due to not being able to cope with anxiety.   -Continue home Lexapro 20 mg daily   -Restarted Atarax and holding Klonopin for now.  Patient reports Klonopin does not help her with her anxiety very much, however she takes it daily.   -Started patient on Zoloft 25 mg daily to help with future anxiety. -stopping Ativan  Cirrhosis: Secondary to Hepatitis C and prior alcohol use. EMR indicated complete Hep C treatment in 2016. MELD score 21 per GI -Rocephin (12/4-12/7), Unasyn (12/7-) -s/p paracentesis removing 3.8 L 12/05 -Continue spironolactone, rifaximin -GI consulted for possible TIPS; GI recommends she is not a candidate for starting   AKI: Improving. Patient has acute elevation of Scr at 1.80> 1.49. Baseline appears to be around 1.08 back in 06/2017.  - Continue to monitor - Cont IV Lasix 60mg  BID, Spironolactone 200mg  daily  HTN: BP since admission has been normotensive.  - Cont to monitor -Holding propranolol dose to 20 mg BID from 60 mg BID  Hypothyroid: Patient has history of hypothyroidism.  - Cont levothyroxine daily before breakfast - TSH normal  FEN/GI: Regular diet, protonix Prophylaxis: Lovenox  Disposition: Home when medically stable  Subjective:  Patient sitting up in chair and appears comfortable. She is in no distress and denies any SOB, chest pain, difficulty breathing. Her mental status is much improved and she knows who she is, where she is; I did not ask her the date.She states she feels much better today.  Objective: Temp:  [97.3 F (36.3 C)-98 F (36.7 C)] 97.3 F (36.3 C) (12/08 1141) Pulse Rate:  [83-112] 102 (12/08 1141) Resp:  [18-32] 25 (12/08 1141) BP: (103-132)/(58-117) 103/58 (12/08 1141) SpO2:  [87 %-100 %] 94 % (12/08 1141) Physical Exam: General: NAD, pleasant Cardiovascular: RRR, no murmurs, normal S1, S2 split, no LE edema  Respiratory: Comfortable work of breathing on 15L O2 .  L lung with no wheezing or crackles on exam and good air exchange. R lung with diminished breath sounds and air exchange. Abdomen:  Swollen obese abdomen, distended, nontender, soft with no fluid wave noted, positive BS in all 4 quadrants Extremities: Moves all 4 extremities Neuro: A&O to person, place, and time. Mild confusion, much improved   Laboratory: Recent Labs  Lab 07/28/17 1247 07/29/17 0351 07/30/17 0402  WBC 9.1 11.3* 17.0*  HGB 8.6* 8.7* 9.2*  HCT 25.9* 25.9* 27.6*  PLT 280 288 249   Recent Labs  Lab 07/29/17 0351 07/30/17 0402 07/31/17 0318  NA 137 136 135  K 3.6 3.7 3.5  CL 109 108 106  CO2 19* 17* 19*  BUN 24* 21* 23*  CREATININE 1.50* 1.49* 1.52*  CALCIUM 8.3* 8.6* 8.1*  PROT 6.9 7.4 5.9*  BILITOT 1.6* 1.8* 2.0*  ALKPHOS 109 113 90  ALT 34 38 37  AST 70* 69* 84*  GLUCOSE 157* 164* 78    Imaging/Diagnostic Tests: Ct Head Wo Contrast  Result Date: 07/27/2017 CLINICAL DATA:  Altered mental status.  Unwitnessed fall. EXAM: CT HEAD WITHOUT CONTRAST TECHNIQUE: Contiguous axial images were obtained from the base of the skull through the vertex without intravenous contrast. COMPARISON:  06/26/2017 FINDINGS: Brain: No evidence of acute infarction, hemorrhage, hydrocephalus, extra-axial collection or mass lesion/mass effect. Unchanged large area of encephalomalacia in the right frontal lobe, and left posterior parietal lobe. Mild brain parenchymal volume loss. Vascular: Mild calcific atherosclerotic disease at the skullbase. Skull: Stable left parietal septal craniotomy changes. Sinuses/Orbits: No acute finding. Other: None. IMPRESSION: Stable appearance of the brain with large area of encephalomalacia in the right frontal lobe and small area of encephalomalacia in the posterior left parietal lobe. No acute findings. Electronically Signed   By: Fidela Salisbury M.D.   On: 07/27/2017 00:17   US Abdomen Limited  Result Date: 07/27/2017 CLINICAL DATA:  Ascites. EXAM: LIMITED ABDOMEN ULTRASOUND FOR ASCITES TECHNIQUE: Limited ultrasound survey for ascites was performed in all four abdominal  quadrants. COMPARISON:  06/26/2017 FINDINGS: Limited ultrasound evaluation of all 4 quadrants of the abdomen demonstrates moderate to large amount of ascites with the biggest pocket measuring 8.5 cm in the right upper quadrant. IMPRESSION: Moderate-to-large amount of abdominal ascites. Electronically Signed   By: Fidela Salisbury M.D.   On: 07/27/2017 04:12   Dg Chest Portable 1 View  Result Date: 07/26/2017 CLINICAL DATA:  Unwitnessed fall. EXAM: PORTABLE CHEST 1 VIEW COMPARISON:  06/28/2017. FINDINGS: Low lung volumes with mild interstitial edema. Heart is top-normal in size. No aortic aneurysm. Small peripheral opacity at the right lung base may represent a focus of pleural thickening, loculated fluid or atelectasis. No acute osseous appearing abnormality. IMPRESSION: 1. Small peripheral opacity at the right lung base may represent focal pleural thickening, small fluid loculation or atelectasis. Mild interstitial edema with low lung volumes. Electronically Signed   By: Ashley Royalty M.D.   On: 07/26/2017 21:26    Nuala Alpha, DO 07/31/2017, 12:41 PM PGY-1, [place] Intern pager: [phone], text pages welcome

## 2017-08-01 ENCOUNTER — Inpatient Hospital Stay (HOSPITAL_COMMUNITY): Payer: Medicare Other

## 2017-08-01 DIAGNOSIS — R0602 Shortness of breath: Secondary | ICD-10-CM

## 2017-08-01 LAB — COOXEMETRY PANEL
Carboxyhemoglobin: 1.6 % — ABNORMAL HIGH (ref 0.5–1.5)
METHEMOGLOBIN: 0.8 % (ref 0.0–1.5)
O2 Saturation: 57.3 %
TOTAL HEMOGLOBIN: 8 g/dL — AB (ref 12.0–16.0)

## 2017-08-01 LAB — BASIC METABOLIC PANEL
ANION GAP: 14 (ref 5–15)
Anion gap: 10 (ref 5–15)
BUN: 26 mg/dL — ABNORMAL HIGH (ref 6–20)
BUN: 29 mg/dL — AB (ref 6–20)
CALCIUM: 8.5 mg/dL — AB (ref 8.9–10.3)
CHLORIDE: 106 mmol/L (ref 101–111)
CO2: 18 mmol/L — AB (ref 22–32)
CO2: 20 mmol/L — AB (ref 22–32)
CREATININE: 1.62 mg/dL — AB (ref 0.44–1.00)
Calcium: 8.3 mg/dL — ABNORMAL LOW (ref 8.9–10.3)
Chloride: 103 mmol/L (ref 101–111)
Creatinine, Ser: 1.6 mg/dL — ABNORMAL HIGH (ref 0.44–1.00)
GFR calc Af Amer: 40 mL/min — ABNORMAL LOW (ref >60)
GFR calc non Af Amer: 34 mL/min — ABNORMAL LOW (ref >60)
GFR, EST AFRICAN AMERICAN: 40 mL/min — AB (ref >60)
GFR, EST NON AFRICAN AMERICAN: 35 mL/min — AB (ref >60)
GLUCOSE: 103 mg/dL — AB (ref 65–99)
Glucose, Bld: 106 mg/dL — ABNORMAL HIGH (ref 65–99)
Potassium: 3.4 mmol/L — ABNORMAL LOW (ref 3.5–5.1)
Potassium: 3.5 mmol/L (ref 3.5–5.1)
Sodium: 135 mmol/L (ref 135–145)
Sodium: 136 mmol/L (ref 135–145)

## 2017-08-01 LAB — LACTIC ACID, PLASMA
LACTIC ACID, VENOUS: 2.7 mmol/L — AB (ref 0.5–1.9)
Lactic Acid, Venous: 2.2 mmol/L (ref 0.5–1.9)

## 2017-08-01 LAB — CBC
HCT: 24.9 % — ABNORMAL LOW (ref 36.0–46.0)
HEMOGLOBIN: 8.5 g/dL — AB (ref 12.0–15.0)
MCH: 37.4 pg — AB (ref 26.0–34.0)
MCHC: 34.1 g/dL (ref 30.0–36.0)
MCV: 109.7 fL — AB (ref 78.0–100.0)
Platelets: 174 10*3/uL (ref 150–400)
RBC: 2.27 MIL/uL — AB (ref 3.87–5.11)
RDW: 16.6 % — ABNORMAL HIGH (ref 11.5–15.5)
WBC: 18.3 10*3/uL — AB (ref 4.0–10.5)

## 2017-08-01 LAB — MAGNESIUM: Magnesium: 1.7 mg/dL (ref 1.7–2.4)

## 2017-08-01 LAB — BRAIN NATRIURETIC PEPTIDE: B NATRIURETIC PEPTIDE 5: 122.2 pg/mL — AB (ref 0.0–100.0)

## 2017-08-01 LAB — PROCALCITONIN: Procalcitonin: 0.28 ng/mL

## 2017-08-01 MED ORDER — FUROSEMIDE 10 MG/ML IJ SOLN
4.0000 mg/h | INTRAVENOUS | Status: DC
  Administered 2017-08-01: 4 mg/h via INTRAVENOUS
  Filled 2017-08-01: qty 25

## 2017-08-01 MED ORDER — ALBUMIN HUMAN 25 % IV SOLN
12.5000 g | Freq: Four times a day (QID) | INTRAVENOUS | Status: DC
  Administered 2017-08-01 – 2017-08-02 (×5): 12.5 g via INTRAVENOUS
  Filled 2017-08-01 (×6): qty 50

## 2017-08-01 MED ORDER — SODIUM CHLORIDE 0.9 % IV SOLN
0.4000 ug/kg/h | INTRAVENOUS | Status: DC
  Filled 2017-08-01: qty 2

## 2017-08-01 MED ORDER — FUROSEMIDE 10 MG/ML IJ SOLN
40.0000 mg | Freq: Once | INTRAMUSCULAR | Status: AC
  Administered 2017-08-01: 40 mg via INTRAVENOUS
  Filled 2017-08-01: qty 4

## 2017-08-01 MED ORDER — CHLORHEXIDINE GLUCONATE 0.12 % MT SOLN
15.0000 mL | Freq: Two times a day (BID) | OROMUCOSAL | Status: DC
  Administered 2017-08-01 – 2017-08-02 (×3): 15 mL via OROMUCOSAL
  Filled 2017-08-01: qty 15

## 2017-08-01 MED ORDER — HALOPERIDOL LACTATE 5 MG/ML IJ SOLN
0.5000 mg | Freq: Four times a day (QID) | INTRAMUSCULAR | Status: DC | PRN
  Administered 2017-08-01: 0.5 mg via INTRAVENOUS
  Filled 2017-08-01: qty 1

## 2017-08-01 MED ORDER — POTASSIUM CHLORIDE 10 MEQ/50ML IV SOLN
10.0000 meq | INTRAVENOUS | Status: AC
  Administered 2017-08-02 (×3): 10 meq via INTRAVENOUS
  Filled 2017-08-01 (×4): qty 50

## 2017-08-01 MED ORDER — DEXTROSE 5 % IV SOLN
100.0000 mg | Freq: Two times a day (BID) | INTRAVENOUS | Status: DC
  Administered 2017-08-01 – 2017-08-02 (×3): 100 mg via INTRAVENOUS
  Filled 2017-08-01 (×5): qty 100

## 2017-08-01 MED ORDER — HALOPERIDOL LACTATE 5 MG/ML IJ SOLN
INTRAMUSCULAR | Status: AC
  Filled 2017-08-01: qty 1

## 2017-08-01 MED ORDER — ORAL CARE MOUTH RINSE
15.0000 mL | Freq: Two times a day (BID) | OROMUCOSAL | Status: DC
  Administered 2017-08-01 – 2017-08-02 (×2): 15 mL via OROMUCOSAL

## 2017-08-01 MED ORDER — DEXMEDETOMIDINE HCL IN NACL 400 MCG/100ML IV SOLN
0.4000 ug/kg/h | INTRAVENOUS | Status: DC
  Administered 2017-08-01: 0.5 ug/kg/h via INTRAVENOUS
  Administered 2017-08-02 (×2): 0.4 ug/kg/h via INTRAVENOUS
  Filled 2017-08-01 (×2): qty 100

## 2017-08-01 MED ORDER — SODIUM CHLORIDE 0.9 % IV SOLN
250.0000 mL | INTRAVENOUS | Status: DC | PRN
Start: 2017-08-01 — End: 2017-08-03

## 2017-08-01 MED ORDER — FUROSEMIDE 10 MG/ML IJ SOLN
100.0000 mg | Freq: Two times a day (BID) | INTRAVENOUS | Status: DC
  Filled 2017-08-01: qty 10

## 2017-08-01 MED ORDER — PIPERACILLIN-TAZOBACTAM 3.375 G IVPB 30 MIN
3.3750 g | Freq: Four times a day (QID) | INTRAVENOUS | Status: DC
  Administered 2017-08-01: 3.375 g via INTRAVENOUS
  Filled 2017-08-01 (×3): qty 50

## 2017-08-01 MED ORDER — MAGNESIUM SULFATE 2 GM/50ML IV SOLN
2.0000 g | Freq: Once | INTRAVENOUS | Status: AC
  Administered 2017-08-02: 2 g via INTRAVENOUS
  Filled 2017-08-01: qty 50

## 2017-08-01 MED ORDER — FOLIC ACID 5 MG/ML IJ SOLN
1.0000 mg | Freq: Every day | INTRAMUSCULAR | Status: DC
  Administered 2017-08-02: 1 mg via INTRAVENOUS
  Filled 2017-08-01 (×2): qty 0.2

## 2017-08-01 MED ORDER — PIPERACILLIN-TAZOBACTAM 3.375 G IVPB
3.3750 g | Freq: Three times a day (TID) | INTRAVENOUS | Status: DC
  Administered 2017-08-02 (×3): 3.375 g via INTRAVENOUS
  Filled 2017-08-01 (×5): qty 50

## 2017-08-01 MED ORDER — POTASSIUM CHLORIDE 20 MEQ PO PACK
20.0000 meq | PACK | Freq: Two times a day (BID) | ORAL | Status: DC
  Filled 2017-08-01: qty 1

## 2017-08-01 MED ORDER — LEVOTHYROXINE SODIUM 100 MCG IV SOLR
100.0000 ug | Freq: Every day | INTRAVENOUS | Status: DC
  Administered 2017-08-02: 100 ug via INTRAVENOUS
  Filled 2017-08-01: qty 5

## 2017-08-01 MED ORDER — HALOPERIDOL LACTATE 5 MG/ML IJ SOLN
5.0000 mg | Freq: Once | INTRAMUSCULAR | Status: AC
  Administered 2017-08-01: 5 mg via INTRAVENOUS

## 2017-08-01 MED ORDER — NOREPINEPHRINE BITARTRATE 1 MG/ML IV SOLN
0.0000 ug/min | INTRAVENOUS | Status: DC
  Administered 2017-08-01: 5 ug/min via INTRAVENOUS
  Administered 2017-08-02: 11 ug/min via INTRAVENOUS
  Administered 2017-08-02: 16 ug/min via INTRAVENOUS
  Administered 2017-08-02 (×2): 15 ug/min via INTRAVENOUS
  Filled 2017-08-01 (×5): qty 4

## 2017-08-01 MED ORDER — IPRATROPIUM-ALBUTEROL 0.5-2.5 (3) MG/3ML IN SOLN
3.0000 mL | Freq: Four times a day (QID) | RESPIRATORY_TRACT | Status: DC
  Administered 2017-08-02 (×4): 3 mL via RESPIRATORY_TRACT
  Filled 2017-08-01 (×3): qty 3

## 2017-08-01 NOTE — Progress Notes (Signed)
PULMONARY / CRITICAL CARE MEDICINE   Name: Wendy [name] MRN: [id number] DOB: February 01, 1961    ADMISSION DATE:  07/26/2017 CONSULTATION DATE:  08/01/2017   CHIEF COMPLAINT: We are asked to see the patient today for progressive dyspnea, she was initially admitted for altered mental status  HISTORY OF PRESENT ILLNESS:   This is a 56 year old with a history of treated hepatitis C and a history of alcohol use from which she is allegedly abstained.  She was initially admitted with altered mental status and was started on therapy for hepatic encephalopathy.  She was also somewhat short of breath and a CT scan of the chest was obtained which showed patchy bilateral infiltrates and a large right pleural effusion.  When her dyspnea progressed the right effusion was tapped and it contained 378 white cells and had an LDH of only 46.  She has been given Lasix in an attempt to mobilize what appears to be drastic ascites but has not responded in fact her net balance for her hospitalization is almost 4 L positive.  Is very confused at the time of my examination, it is not clear whether she has any significant cough is not clear whether she has any chest pain.  Her last echocardiogram was in 2015 and showed an EF of 65-70% but it also showed heavy calcification of her coronary arteries.  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  PAST SURGICAL HISTORY: She  has a past surgical history that includes ORIF hip fracture (2010); Upper gastrointestinal endoscopy (08/05/2007); Splenectomy; Trudee Kuster hole for subdural hematoma (2004); Upper gastrointestinal endoscopy (10/14/2011); Colonoscopy (08/2012); Kyphoplasty (Bilateral, 09/21/2013); Esophagogastroduodenoscopy (egd) with propofol (N/A, 01/26/2014); Nasal sinus surgery; and IR Paracentesis (07/28/2017).  Allergies  Allergen Reactions  . Codeine Phosphate Itching  . Codeine Rash    No current facility-administered medications on file prior to encounter.    Current Outpatient Medications on File Prior to Encounter  Medication Sig  . acetaminophen (TYLENOL 8 HOUR) 650 MG CR tablet Take 1 tablet (650 mg total) by mouth every 8 (eight) hours as needed for pain.  . bisacodyl ([employer] GENTLE LAXATIVE) 5 MG EC tablet TAKE 1 TABLET (5 MG TOTAL) BY MOUTH DAILY. (Patient taking differently: Take 5 mg by mouth daily. )  . CHANTIX 0.5 MG tablet TAKE 1 TABLET BY MOUTH TWICE A DAY  . clonazePAM (KLONOPIN) 1 MG tablet Take 0.5-1 tablets (0.5-1 mg total) by mouth 2 (two) times daily as needed for anxiety.  . docusate sodium (COLACE) 100 MG capsule TAKE 1 CAPSULE BY MOUTH TWICE A DAY (Patient taking differently: TAKE 100mg  BY MOUTH TWICE A DAY)  . escitalopram (LEXAPRO) 20 MG tablet Take 0.5 tablets (10 mg total) daily by mouth. Stop the 10mg  tablets. Dose increased to 20mg  today  . furosemide (LASIX) 40 MG tablet Take 2 tablets (80 mg total) by mouth daily.  Marland Kitchen gabapentin (NEURONTIN) 300 MG capsule Take 1 capsule (300 mg total) by mouth  3 (three) times daily.  . hydrOXYzine (ATARAX/VISTARIL) 25 MG tablet TAKE 1 TO 2 TABLETS BY MOUTH TWICE A DAY AS NEEDED FOR ITCHING (Patient taking differently: TAKE 25mg  to 50mg  MOUTH TWICE A DAY  AS NEEDED FOR ITCHING)  . levothyroxine (SYNTHROID, LEVOTHROID) 200 MCG tablet Take 1 tablet (200 mcg total) by mouth daily before breakfast.  . loratadine (CLARITIN) 10 MG tablet TAKE 1 TABLET BY MOUTH EVERY DAY (Patient taking differently: TAKE 10mg  BY MOUTH EVERY DAY)  . montelukast (SINGULAIR) 10 MG tablet TAKE 1 TABLET (10 MG TOTAL) BY MOUTH DAILY.  Marland Kitchen omeprazole (PRILOSEC) 20 MG capsule TAKE 1 CAPSULE (20 MG TOTAL) BY MOUTH DAILY.  Marland Kitchen propranolol (INDERAL) 60 MG tablet TAKE 1 TABLET (60 MG TOTAL) BY MOUTH 2 (TWO) TIMES DAILY.  Marland Kitchen QUEtiapine (SEROQUEL) 25 MG tablet Take 1 tablet (25 mg total) by mouth at bedtime.  . ranitidine (ZANTAC) 300 MG tablet TAKE 1 TABLET (300 MG TOTAL) BY MOUTH AT BEDTIME.  . rifaximin (XIFAXAN) 550 MG TABS tablet Take 1 tablet (550 mg total) by mouth 2 (two) times daily.  Marland Kitchen spironolactone (ALDACTONE) 100 MG tablet TAKE 1.5 TABLETS (150 MG TOTAL) BY MOUTH DAILY.  . traMADol (ULTRAM) 50 MG tablet Take 1 tablet (50 mg total) by mouth 2 (two) times daily.  . traZODone (DESYREL) 50 MG tablet TAKE 0.5 TABLETS (25 MG TOTAL) BY MOUTH AT BEDTIME AS NEEDED FOR SLEEP.  Marland Kitchen cycloSPORINE (RESTASIS) 0.05 % ophthalmic emulsion Place 1 drop into both eyes 2 (two) times daily. (Patient not taking: Reported on 07/27/2017)  . fluticasone (FLONASE) 50 MCG/ACT nasal spray Place 2 sprays into both nostrils daily. (Patient not taking: Reported on 07/27/2017)  . lactulose (CHRONULAC) 10 GM/15ML solution Take 30 mLs (20 g total) 3 (three) times daily by mouth. Take 2 tbsp by mouth 2 times a day (Patient not taking: Reported on 07/27/2017)  . spironolactone (ALDACTONE) 100 MG tablet Take 2 tablets (200 mg total) by mouth daily. (Patient not taking: Reported on 07/27/2017)    FAMILY HISTORY:  Her indicated that her mother is deceased. She indicated that her father is alive. She indicated that her sister is deceased. She indicated that the status of her maternal grandmother is unknown. She indicated  that the status of her neg hx is unknown.   SOCIAL HISTORY: She  reports that she quit smoking about 4 weeks ago. Her smoking use included cigarettes. She has a 2.50 pack-year smoking history. she has never used smokeless tobacco. She reports that she does not drink alcohol or use drugs.  REVIEW OF SYSTEMS:   Not obtainable  SUBJECTIVE:  Not obtainable  VITAL SIGNS: BP 108/71 (BP Location: Left Leg)   Pulse (!) 112   Temp 98.1 F (36.7 C) (Oral)   Resp (!) 27   Ht 5\' 6"  (1.676 m)   Wt 224 lb 6.9 oz (101.8 kg)   SpO2 (!) 86%   BMI 36.22 kg/m   HEMODYNAMICS:    VENTILATOR SETTINGS: FiO2 (%):  [50 %-60 %] 60 %  INTAKE / OUTPUT: I/O last 3 completed shifts: In: 900 [IV [id number]:756] Out: 4332 [Urine:200; Other:1350]  PHYSICAL EXAMINATION: General: He is intermittently agitated and difficult to direct. Neuro: Pupils are equal at 4 mm and she is moving all fours.  She is not giving consistent responses to questions Cardiovascular: Irregular with a soft S4.  I do not appreciate a murmur Lungs: She is slightly tachypneic even on BiPAP.  There is symmetric air movement anteriorly,  no wheezes. Abdomen: The abdomen is somewhat distended without any overt fluid wave organomegaly masses tenderness guarding or rebound Skin: She has anasarca  with weeping from her skin.  LABS:  BMET Recent Labs  Lab 07/30/17 0402 07/31/17 0318 08/01/17 0514  NA 136 135 135  K 3.7 3.5 3.4*  CL 108 106 103  CO2 17* 19* 18*  BUN 21* 23* 26*  CREATININE 1.49* 1.52* 1.60*  GLUCOSE 164* 78 106*    Electrolytes Recent Labs  Lab 07/30/17 0402 07/31/17 0318 08/01/17 0514  CALCIUM 8.6* 8.1* 8.5*    CBC Recent Labs  Lab 07/28/17 1247 07/29/17 0351 07/30/17 0402  WBC 9.1 11.3* 17.0*  HGB 8.6* 8.7* 9.2*  HCT 25.9* 25.9* 27.6*  PLT 280 288 249    Coag's Recent Labs  Lab 07/26/17 2042 07/30/17 0912  INR 1.69 1.94    Sepsis Markers Recent Labs  Lab 07/30/17 1529  07/30/17 1844 07/31/17 0318 08/01/17 0514  LATICACIDVEN 2.8* 2.7*  --   --   PROCALCITON 0.21  --  0.27 0.28    ABG Recent Labs  Lab 07/27/17 0101 07/28/17 1555 07/30/17 0409  PHART 7.422 7.442 7.449  PCO2ART 29.0* 27.0* 25.3*  PO2ART 87.0 78.0* 74.1*    Liver Enzymes Recent Labs  Lab 07/29/17 0351 07/30/17 0402 07/31/17 0318  AST 70* 69* 84*  ALT 34 38 37  ALKPHOS 109 113 90  BILITOT 1.6* 1.8* 2.0*  ALBUMIN 1.9* 2.1* 1.8*    Cardiac Enzymes Recent Labs  Lab 07/27/17 0312  TROPONINI <0.03    Glucose Recent Labs  Lab 07/27/17 0450  GLUCAP 110*    Imaging No results found.   ANTIBIOTICS: Switched to Doxycycline and Zosyn 12/9  SIGNIFICANT EVENTS: Thoracentesis 12/7 378 WBC's but LDH only 46    DISCUSSION: This is a 56 year old with advanced liver disease secondary to hepatitis C which has been treated and a history of alcohol use.  She is currently abstinent.  She was admitted with altered mental status and has had difficulties with dyspnea throughout her hospital course.  A CT scan of the chest initially showed a large right pleural effusion and some patchy infiltrates.  We suggested diuresis for the large right pleural effusion but she has not diuresed through the course of her hospitalization and and is fact positive almost 4 L.  She was started on Unasyn empirically for her patchy bilateral infiltrates but her oxygenation is continued to decline and she has required BiPAP today.  ASSESSMENT / PLAN:  PULMONARY A: He has patchy bilateral infiltrates and no visit of cultures.  Going to continue to cover her for atypicals however I have switched from azithromycin to doxycycline to redundantly cover the infiltrates in the event that they represent aspiration.  I have also switched from Unasyn to Zosyn for broader bacterial coverage but very much doubt that this is the issue.  I continue to suspect that her right pleural effusion is essentially thoracic  ascites and I have placed her on a Lasix infusion with intermittent IV doses of albumin hoping to mobilize some fluid.  Should also be noted that her last echo was in 2015 and on a recent CT scan of the chest she has multiple coronary calcifications and is entirely conceivable that she has had an event with adverse effects on her LV function.  I have ordered a repeat echo to evaluate that possibility.    GASTROINTESTINAL A: She is not a candidate for a TIPS procedure for relief  of her ascites as she is already encephalopathic.  NEUROLOGIC A: He is significantly encephalopathic.  I have discontinued her Atarax and tramadol which may contribute to her encephalopathy.  Patients with hepatic encephalopathy they cannot clear there ammonia LSA have a normal serum potassium.  I have ordered a supplement and will have to follow her potassiums on a regular basis as she is also receiving Aldactone.   I have ordered Precedex to control her agitation.  Greater than 35 minutes has been spent in the care of this patient today   Lars Masson, MD Pulmonary and [place] Pager: [phone]  08/01/2017, 3:44 PM

## 2017-08-01 NOTE — Progress Notes (Signed)
Family Medicine Teaching Service Daily Progress Note Intern Pager: [phone]  Patient name: Wendy [name] Medical record number: [id number] Date of birth: [birth year] Age: 56 y.o. Gender: female  Primary Care Provider: Verner Mould, MD Consultants: IR Code Status: Full  Pt Overview and Major Events to Date:  Wendy [name] is a 56 year old female presenting with AMS with this fall.  PMH is significant for hepatic encephalopathy, cirrhosis secondary to hepatitis C with past alcohol use, esophageal varices, noncompliant with medications, anxiety, panic disorder, frequent falls, HTN, GERD, and obesity.  12/04: Admitted for hepatic encephalopathy. 12/07: Thoracentesis removed 1.5 L. 12/08: Palliative consulted.  Assessment and Plan:  AMS/unwitnessed fall: AMS improved.  Patient with history of cirrhosis and hepatic encephalopathy with ammonia level of 99 and admission.  GI not recommending TI PS at this time and has history of noncompliance with medication.  -Lactulose 20 mg twice daily -Neurochecks every 4h -Regular diet -S/P paracentesis which removed 3.8 L on 12/5 -Continue Unasyn- started (12/7-) for possible aspiration pneumonia  Right pleural effusion, s/p thoracentesis: S/P thoracentesis on 12 7 which removed 1.5 L of yellow turbid fluid. -Pulmonology has signed off after thoracentesis. -IV Lasix 80 mg twice daily, spironolactone 300 mg daily -Patient requiring nasal cannula  Anxiety/panic disorder:  Previous notes from clinic indicate patient has severe anxiety and Agouraphobia.  She misses most of her doctor's appointments due to this.   -Continue home Lexapro 20 mg daily  -Restarted Atarax and holding Klonopin for now.  Patient reports Klonopin does not help her with her anxiety very much, however she takes it daily.   -Started patient on Zoloft 25 mg daily to help with future anxiety. -stopping Ativan  Cirrhosis: Secondary to hepatitis C and prior alcohol  use.  Completed hep C treatment in 2016.  Meld score of 21. -Rocephin (12/4-12/7), Unasyn (12/7-) -s/p paracentesis removing 3.8 L 12/05 -Continue spironolactone, IV Lasix, rifaximin -GI is following-appreciate recommendations.She is not a candidate TI PS, recommending aggressive diuresis-patient likely to be transferred to [facility] for possible liver transplant  AKI:  Patient has an acute elevation of serum creatinine to 1.80>1.60.  Baseline appears to be around 1.0 back in 06/2017.  -Continue to monitor -Continue IV Lasix 80 mg twice daily, spironolactone 300 mg daily  HTN: BP since admission has been normotensive.  - Cont to monitor -Holding propranolol dose to 20 mg BID from 60 mg BID  Hypothyroidism: Chronic. Patient has history of hypothyroidism.  - Cont levothyroxine daily before breakfast - TSH normal  FEN/GI:  Regular diet, Protonix Prophylaxis: Lovenox  Disposition: Transfer to [facility] when bed available, per GI  Subjective:  Patient sitting up in chair watching the snow. She is mildly confused, but able to carry on a conversation. She says she is not hurting and feels much better after thoracentesis.   Objective: Temp:  [97.3 F (36.3 C)-98.5 F (36.9 C)] 97.8 F (36.6 C) (12/09 0400) Pulse Rate:  [100-115] 112 (12/09 0808) Resp:  [19-35] 27 (12/09 0808) BP: (103-124)/(54-97) 115/70 (12/09 0400) SpO2:  [90 %-100 %] 93 % (12/09 0808) FiO2 (%):  [50 %] 50 % (12/09 0600) Weight:  [224 lb 6.9 oz (101.8 kg)] 224 lb 6.9 oz (101.8 kg) (12/09 0442) Physical Exam: General: NAD, pleasant, sitting in chair on HFNC Cardiovascular: RRR, no m/r/g, no LE edema Respiratory: Mildly increased work of breathing on 15 L O2 [place].  Left lung with diffuse wheezing, no crackles on exam and good air exchange.  Right lung with diminished  breath sounds and air exchange. Gastrointestinal: Swollen obese abdomen, distended, nontender, soft with no fluid wave noted, positive BS in all 4  quadrants Derm: no rashes appreciated Extremities: Moves all 4 extremities Neuro: Alert and oriented to person, place, and time.  Mild confusion, much improved   Laboratory: Recent Labs  Lab 07/28/17 1247 07/29/17 0351 07/30/17 0402  WBC 9.1 11.3* 17.0*  HGB 8.6* 8.7* 9.2*  HCT 25.9* 25.9* 27.6*  PLT 280 288 249   Recent Labs  Lab 07/29/17 0351 07/30/17 0402 07/31/17 0318 08/01/17 0514  NA 137 136 135 135  K 3.6 3.7 3.5 3.4*  CL 109 108 106 103  CO2 19* 17* 19* 18*  BUN 24* 21* 23* 26*  CREATININE 1.50* 1.49* 1.52* 1.60*  CALCIUM 8.3* 8.6* 8.1* 8.5*  PROT 6.9 7.4 5.9*  --   BILITOT 1.6* 1.8* 2.0*  --   ALKPHOS 109 113 90  --   ALT 34 38 37  --   AST 70* 69* 84*  --   GLUCOSE 157* 164* 78 106*    Imaging/Diagnostic Tests: Ct Head Wo Contrast  Result Date: 07/27/2017 CLINICAL DATA:  Altered mental status.  Unwitnessed fall. EXAM: CT HEAD WITHOUT CONTRAST TECHNIQUE: Contiguous axial images were obtained from the base of the skull through the vertex without intravenous contrast. COMPARISON:  06/26/2017 FINDINGS: Brain: No evidence of acute infarction, hemorrhage, hydrocephalus, extra-axial collection or mass lesion/mass effect. Unchanged large area of encephalomalacia in the right frontal lobe, and left posterior parietal lobe. Mild brain parenchymal volume loss. Vascular: Mild calcific atherosclerotic disease at the skullbase. Skull: Stable left parietal septal craniotomy changes. Sinuses/Orbits: No acute finding. Other: None. IMPRESSION: Stable appearance of the brain with large area of encephalomalacia in the right frontal lobe and small area of encephalomalacia in the posterior left parietal lobe. No acute findings. Electronically Signed   By: Fidela Salisbury M.D.   On: 07/27/2017 00:17   US Abdomen Limited  Result Date: 07/27/2017 CLINICAL DATA:  Ascites. EXAM: LIMITED ABDOMEN ULTRASOUND FOR ASCITES TECHNIQUE: Limited ultrasound survey for ascites was performed in  all four abdominal quadrants. COMPARISON:  06/26/2017 FINDINGS: Limited ultrasound evaluation of all 4 quadrants of the abdomen demonstrates moderate to large amount of ascites with the biggest pocket measuring 8.5 cm in the right upper quadrant. IMPRESSION: Moderate-to-large amount of abdominal ascites. Electronically Signed   By: Fidela Salisbury M.D.   On: 07/27/2017 04:12   Dg Chest Portable 1 View  Result Date: 07/26/2017 CLINICAL DATA:  Unwitnessed fall. EXAM: PORTABLE CHEST 1 VIEW COMPARISON:  06/28/2017. FINDINGS: Low lung volumes with mild interstitial edema. Heart is top-normal in size. No aortic aneurysm. Small peripheral opacity at the right lung base may represent a focus of pleural thickening, loculated fluid or atelectasis. No acute osseous appearing abnormality. IMPRESSION: 1. Small peripheral opacity at the right lung base may represent focal pleural thickening, small fluid loculation or atelectasis. Mild interstitial edema with low lung volumes. Electronically Signed   By: Ashley Royalty M.D.   On: 07/26/2017 21:26    Vara Mairena, [place], DO 08/01/2017, 9:03 AM PGY-1, [place] Intern pager: [phone], text pages welcome

## 2017-08-01 NOTE — Progress Notes (Signed)
Wendy [name] Gastroenterology Progress Note    Since last GI note: Intermittently on BIPAP and HFNC  Currently on HFNC, sitting up in bed, tachypnic, satting 95%  She is confused.  Objective: Vital signs in last 24 hours: Temp:  [97.3 F (36.3 C)-98.5 F (36.9 C)] 97.8 F (36.6 C) (12/09 0400) [name] Rate:  [100-115] 112 (12/09 0808) Resp:  [19-35] 27 (12/09 0808) BP: (103-124)/(54-97) 115/70 (12/09 0400) SpO2:  [90 %-100 %] 93 % (12/09 0808) FiO2 (%):  [50 %] 50 % (12/09 0600) Weight:  [224 lb 6.9 oz (101.8 kg)] 224 lb 6.9 oz (101.8 kg) (12/09 0442) Last BM Date: 07/31/17 General: alert and oriented times 2 Heart: regular rate and rythm Abdomen: soft, non-tender, non-distended, normal bowel sounds   Lab Results: Recent Labs    07/30/17 0402  WBC 17.0*  HGB 9.2*  PLT 249  MCV 110.8*   Recent Labs    07/30/17 0402 07/31/17 0318 08/01/17 0514  NA 136 135 135  K 3.7 3.5 3.4*  CL 108 106 103  CO2 17* 19* 18*  GLUCOSE 164* 78 106*  BUN 21* 23* 26*  CREATININE 1.49* 1.52* 1.60*  CALCIUM 8.6* 8.1* 8.5*   Recent Labs    07/30/17 0402 07/31/17 0318  PROT 7.4 5.9*  ALBUMIN 2.1* 1.8*  AST 69* 84*  ALT 38 37  ALKPHOS 113 90  BILITOT 1.8* 2.0*   Recent Labs    07/30/17 0912  INR 1.94   Medications: Scheduled Meds: . enoxaparin (LOVENOX) injection  40 mg Subcutaneous Q24H  . folic acid  1 mg Oral Daily  . furosemide  80 mg Intravenous BID  . ipratropium-albuterol  3 mL Nebulization TID  . lactulose  20 g Oral BID  . levothyroxine  200 mcg Oral QAC breakfast  . mouth rinse  15 mL Mouth Rinse BID  . multivitamin with minerals  1 tablet Oral Daily  . rifaximin  550 mg Oral BID  . sertraline  25 mg Oral Daily  . spironolactone  300 mg Oral Daily  . thiamine  100 mg Oral Daily   Or  . thiamine  100 mg Intravenous Daily   Continuous Infusions: . ampicillin-sulbactam (UNASYN) IV Stopped (08/01/17 0240)  . azithromycin Stopped (07/31/17 2151)   PRN  Meds:.hydrOXYzine, lidocaine, phenol, prochlorperazine, traMADol, zolpidem    Assessment/Plan: 56 y.o. female with Hep C cirrhosis, decompensated; hepatic hydrothorax  Current MELD is 21.    She is critically ill because of the large hepatic hydrothorax.  TIPS is not an option given her high MELD and hepatic encephalopathy.  I cannot get a handle on her I/Os and today will order a foley to be placed. She is on lasix 80mg  IV BID and aldactone 300mg  daily.  Cr is currently 1.6.  It was up to 1.8 earlier this admission but her baseline is around 1.  I am going to increase her lasix to 100mg  IV BID this morning.  I spoke with Wendy [name] liver Wendy [name] on Friday (she has seen Wendy [name] earlier this year when hepatic hydrothorax was not an issue and her MELD was 10).  He advised against TIPS for the reasons outlined above; aggressive diuresis.   I spoke with Wendy [name] again this morning with update on her situation and he kindly agreed to accept her in hosp to hosp transfer for formal transplant evaluation.  I updated the Wendy [name].  It may be 1-2 days before a bed will open and we will follow  along until then.   Wendy Banister, MD  08/01/2017, 8:59 AM [facility] Pager [phone]

## 2017-08-01 NOTE — Progress Notes (Signed)
Patient's PIV has started to leak, bleed and is unable to flush without positioning catheter. PIV redressed and adjusted. PIV still not functioning properly. IV team consulted for PIV access. IV team to bedside and reporting no good sites for PIV d/t poor skin integrity. PICC requested by CCM Md. IV team reporting PICC will more than likely be done tomorrow. CCM Md notified and requested CVC once patient transfers to the ICU. 4N ICU charge RN notified of need for CVC once patient transfers.

## 2017-08-01 NOTE — Progress Notes (Signed)
Patient with increased confusion and agitation throughout the day. Patient refusing BiPAP and removing care equipment including BiPAP and bilateral wrist restraints. Patient tachypnic with increased WOB on BiPAP and tachypnic with increased WOB and desaturation to the mid to low 80's on HFNC at 15L. Marland Kitchen Reorientation and reinforcement of education has been unsuccessful. Patient continues to remove equipment. No safety sitter is available. Tele sitter at bedside has been unsuccessful. RN remaining at bedside to provide care and maintain patient safety. Attending MD notified. Order for 0.5mg  Haldol q6 hrs PRN received. Attending at bedside assessing patient. 0.5mg  Haldol given. RN remains at bedside and will continue to monitor.

## 2017-08-01 NOTE — Procedures (Signed)
Central Venous Catheter Insertion Procedure Note Wendy [name] [id number] May 23, 1961  Procedure: Insertion of Central Venous Catheter Indications: Assessment of intravascular volume, Drug and/or fluid administration and Frequent blood sampling  Procedure Details Consent: Risks of procedure as well as the alternatives and risks of each were explained to the (patient/caregiver).  Consent for procedure obtained. Time Out: Verified patient identification, verified procedure, site/side was marked, verified correct patient position, special equipment/implants available, medications/allergies/relevent history reviewed, required imaging and test results available.  Performed  Maximum sterile technique was used including antiseptics, cap, gloves, gown, hand hygiene, mask and sheet. Skin prep: Chlorhexidine; local anesthetic administered A antimicrobial bonded/coated triple lumen catheter was placed in the right internal jugular vein using the Seldinger technique.  Evaluation Blood flow good Complications: No apparent complications Patient did tolerate procedure well. Chest X-ray ordered to verify placement.  CXR: pending.  Hayden Pedro, AGACNP-BC [facility]  Pgr: [phone]  PCCM Pgr: [phone]

## 2017-08-02 ENCOUNTER — Inpatient Hospital Stay (HOSPITAL_COMMUNITY): Payer: Medicare Other

## 2017-08-02 ENCOUNTER — Inpatient Hospital Stay (HOSPITAL_COMMUNITY): Payer: Self-pay

## 2017-08-02 DIAGNOSIS — D72829 Elevated white blood cell count, unspecified: Secondary | ICD-10-CM

## 2017-08-02 DIAGNOSIS — D689 Coagulation defect, unspecified: Secondary | ICD-10-CM

## 2017-08-02 DIAGNOSIS — E876 Hypokalemia: Secondary | ICD-10-CM

## 2017-08-02 DIAGNOSIS — I340 Nonrheumatic mitral (valve) insufficiency: Secondary | ICD-10-CM

## 2017-08-02 DIAGNOSIS — D539 Nutritional anemia, unspecified: Secondary | ICD-10-CM

## 2017-08-02 DIAGNOSIS — I361 Nonrheumatic tricuspid (valve) insufficiency: Secondary | ICD-10-CM

## 2017-08-02 DIAGNOSIS — R188 Other ascites: Secondary | ICD-10-CM

## 2017-08-02 LAB — GLUCOSE, CAPILLARY
GLUCOSE-CAPILLARY: 166 mg/dL — AB (ref 65–99)
Glucose-Capillary: 173 mg/dL — ABNORMAL HIGH (ref 65–99)
Glucose-Capillary: 173 mg/dL — ABNORMAL HIGH (ref 65–99)

## 2017-08-02 LAB — POCT I-STAT 3, ART BLOOD GAS (G3+)
ACID-BASE DEFICIT: 6 mmol/L — AB (ref 0.0–2.0)
Bicarbonate: 19.6 mmol/L — ABNORMAL LOW (ref 20.0–28.0)
O2 SAT: 100 %
PH ART: 7.295 — AB (ref 7.350–7.450)
PO2 ART: 288 mmHg — AB (ref 83.0–108.0)
TCO2: 21 mmol/L — ABNORMAL LOW (ref 22–32)
pCO2 arterial: 40.3 mmHg (ref 32.0–48.0)

## 2017-08-02 LAB — RESPIRATORY PANEL BY PCR
ADENOVIRUS-RVPPCR: NOT DETECTED
Bordetella pertussis: NOT DETECTED
CHLAMYDOPHILA PNEUMONIAE-RVPPCR: NOT DETECTED
CORONAVIRUS HKU1-RVPPCR: NOT DETECTED
CORONAVIRUS NL63-RVPPCR: NOT DETECTED
Coronavirus 229E: NOT DETECTED
Coronavirus OC43: NOT DETECTED
INFLUENZA A-RVPPCR: NOT DETECTED
Influenza B: NOT DETECTED
MYCOPLASMA PNEUMONIAE-RVPPCR: NOT DETECTED
Metapneumovirus: NOT DETECTED
Parainfluenza Virus 1: NOT DETECTED
Parainfluenza Virus 2: NOT DETECTED
Parainfluenza Virus 3: NOT DETECTED
Parainfluenza Virus 4: NOT DETECTED
Respiratory Syncytial Virus: NOT DETECTED
Rhinovirus / Enterovirus: NOT DETECTED

## 2017-08-02 LAB — BRAIN NATRIURETIC PEPTIDE: B NATRIURETIC PEPTIDE 5: 175.4 pg/mL — AB (ref 0.0–100.0)

## 2017-08-02 LAB — COMPREHENSIVE METABOLIC PANEL
ALK PHOS: 89 U/L (ref 38–126)
ALT: 35 U/L (ref 14–54)
ANION GAP: 10 (ref 5–15)
AST: 78 U/L — ABNORMAL HIGH (ref 15–41)
Albumin: 2 g/dL — ABNORMAL LOW (ref 3.5–5.0)
BUN: 33 mg/dL — ABNORMAL HIGH (ref 6–20)
CALCIUM: 8.3 mg/dL — AB (ref 8.9–10.3)
CO2: 20 mmol/L — ABNORMAL LOW (ref 22–32)
CREATININE: 1.69 mg/dL — AB (ref 0.44–1.00)
Chloride: 104 mmol/L (ref 101–111)
GFR, EST AFRICAN AMERICAN: 38 mL/min — AB (ref >60)
GFR, EST NON AFRICAN AMERICAN: 33 mL/min — AB (ref >60)
Glucose, Bld: 187 mg/dL — ABNORMAL HIGH (ref 65–99)
Potassium: 3.4 mmol/L — ABNORMAL LOW (ref 3.5–5.1)
Sodium: 134 mmol/L — ABNORMAL LOW (ref 135–145)
Total Bilirubin: 1.9 mg/dL — ABNORMAL HIGH (ref 0.3–1.2)
Total Protein: 6.3 g/dL — ABNORMAL LOW (ref 6.5–8.1)

## 2017-08-02 LAB — ECHOCARDIOGRAM COMPLETE
Height: 66 in
WEIGHTICAEL: 3456.81 [oz_av]

## 2017-08-02 LAB — URINALYSIS, ROUTINE W REFLEX MICROSCOPIC
BILIRUBIN URINE: NEGATIVE
Glucose, UA: NEGATIVE mg/dL
KETONES UR: NEGATIVE mg/dL
Leukocytes, UA: NEGATIVE
Nitrite: NEGATIVE
PH: 5 (ref 5.0–8.0)
PROTEIN: NEGATIVE mg/dL
Specific Gravity, Urine: 1.023 (ref 1.005–1.030)

## 2017-08-02 LAB — CULTURE, BODY FLUID W GRAM STAIN -BOTTLE: Culture: NO GROWTH

## 2017-08-02 LAB — CBC WITH DIFFERENTIAL/PLATELET
Basophils Absolute: 0 10*3/uL (ref 0.0–0.1)
Basophils Relative: 0 %
EOS PCT: 0 %
Eosinophils Absolute: 0.1 10*3/uL (ref 0.0–0.7)
HCT: 22.5 % — ABNORMAL LOW (ref 36.0–46.0)
HEMOGLOBIN: 7.6 g/dL — AB (ref 12.0–15.0)
LYMPHS ABS: 1 10*3/uL (ref 0.7–4.0)
Lymphocytes Relative: 5 %
MCH: 37.1 pg — AB (ref 26.0–34.0)
MCHC: 33.8 g/dL (ref 30.0–36.0)
MCV: 109.8 fL — AB (ref 78.0–100.0)
MONOS PCT: 9 %
Monocytes Absolute: 1.5 10*3/uL — ABNORMAL HIGH (ref 0.1–1.0)
Neutro Abs: 15 10*3/uL — ABNORMAL HIGH (ref 1.7–7.7)
Neutrophils Relative %: 86 %
PLATELETS: 163 10*3/uL (ref 150–400)
RBC: 2.05 MIL/uL — AB (ref 3.87–5.11)
RDW: 16.6 % — ABNORMAL HIGH (ref 11.5–15.5)
WBC: 17.6 10*3/uL — AB (ref 4.0–10.5)

## 2017-08-02 LAB — CORTISOL: Cortisol, Plasma: 19.9 ug/dL

## 2017-08-02 LAB — PATHOLOGIST SMEAR REVIEW: Path Review: REACTIVE

## 2017-08-02 LAB — CULTURE, BODY FLUID-BOTTLE

## 2017-08-02 LAB — MAGNESIUM: MAGNESIUM: 2.3 mg/dL (ref 1.7–2.4)

## 2017-08-02 MED ORDER — ROCURONIUM BROMIDE 50 MG/5ML IV SOLN
20.0000 mg | Freq: Once | INTRAVENOUS | Status: AC
  Administered 2017-08-02: 20 mg via INTRAVENOUS

## 2017-08-02 MED ORDER — RIFAXIMIN 550 MG PO TABS: 550.0000 mg | ORAL_TABLET | Freq: Two times a day (BID) | ORAL | Status: AC

## 2017-08-02 MED ORDER — ORAL CARE MOUTH RINSE: 15.0000 mL | Freq: Two times a day (BID) | OROMUCOSAL | 0 refills | Status: AC

## 2017-08-02 MED ORDER — ORAL CARE MOUTH RINSE
15.0000 mL | OROMUCOSAL | Status: DC
  Administered 2017-08-02 (×5): 15 mL via OROMUCOSAL

## 2017-08-02 MED ORDER — LORAZEPAM 2 MG/ML IJ SOLN
2.0000 mg | Freq: Once | INTRAMUSCULAR | Status: AC
  Administered 2017-08-02: 2 mg via INTRAVENOUS

## 2017-08-02 MED ORDER — LEVOTHYROXINE SODIUM 100 MCG IV SOLR: 100.0000 ug | Freq: Every day | INTRAVENOUS | Status: AC

## 2017-08-02 MED ORDER — SODIUM CHLORIDE 0.9 % IV SOLN
2.0000 mg/h | INTRAVENOUS | Status: DC
  Administered 2017-08-02: 2 mg/h via INTRAVENOUS
  Administered 2017-08-02: 6 mg/h via INTRAVENOUS
  Filled 2017-08-02 (×3): qty 10

## 2017-08-02 MED ORDER — LACTULOSE 10 GM/15ML PO SOLN: 20.0000 g | Freq: Two times a day (BID) | ORAL | 0 refills | Status: AC

## 2017-08-02 MED ORDER — LORAZEPAM 2 MG/ML IJ SOLN
INTRAMUSCULAR | Status: AC
  Filled 2017-08-02: qty 1

## 2017-08-02 MED ORDER — ALBUMIN HUMAN 25 % IV SOLN: 12.5000 g | Freq: Four times a day (QID) | INTRAVENOUS | Status: AC

## 2017-08-02 MED ORDER — FOLIC ACID 5 MG/ML IJ SOLN: 1.0000 mg | Freq: Every day | INTRAMUSCULAR | Status: AC

## 2017-08-02 MED ORDER — LORAZEPAM 2 MG/ML IJ SOLN
1.0000 mg | Freq: Once | INTRAMUSCULAR | Status: AC
  Administered 2017-08-02: 1 mg via INTRAVENOUS

## 2017-08-02 MED ORDER — ETOMIDATE 2 MG/ML IV SOLN
20.0000 mg | Freq: Once | INTRAVENOUS | Status: AC
  Administered 2017-08-02: 20 mg via INTRAVENOUS

## 2017-08-02 MED ORDER — DOXYCYCLINE HYCLATE 100 MG IV SOLR: 100.0000 mg | Freq: Two times a day (BID) | INTRAVENOUS | Status: AC

## 2017-08-02 MED ORDER — SODIUM CHLORIDE 0.9 % IV SOLN: 2.0000 mg/h | INTRAVENOUS | Status: AC

## 2017-08-02 MED ORDER — FUROSEMIDE 10 MG/ML IJ SOLN: 4.0000 mg/h | INTRAVENOUS | Status: AC

## 2017-08-02 MED ORDER — ROCURONIUM BROMIDE 50 MG/5ML IV SOLN
60.0000 mg | Freq: Once | INTRAVENOUS | Status: AC
  Administered 2017-08-02: 60 mg via INTRAVENOUS

## 2017-08-02 MED ORDER — PIPERACILLIN-TAZOBACTAM 3.375 G IVPB: 3.3750 g | Freq: Three times a day (TID) | INTRAVENOUS | Status: AC

## 2017-08-02 MED ORDER — IPRATROPIUM-ALBUTEROL 0.5-2.5 (3) MG/3ML IN SOLN: 3.0000 mL | Freq: Four times a day (QID) | RESPIRATORY_TRACT | Status: AC

## 2017-08-02 MED ORDER — THIAMINE HCL 100 MG PO TABS: 100.0000 mg | ORAL_TABLET | Freq: Every day | ORAL | Status: AC

## 2017-08-02 MED ORDER — SODIUM CHLORIDE 0.9 % IV SOLN: 250.0000 mL | INTRAVENOUS | 0 refills | Status: AC | PRN

## 2017-08-02 MED ORDER — DEXMEDETOMIDINE HCL IN NACL 400 MCG/100ML IV SOLN: 0.4000 ug/kg/h | INTRAVENOUS | Status: DC

## 2017-08-02 MED ORDER — ENOXAPARIN SODIUM 40 MG/0.4ML ~~LOC~~ SOLN: 40.0000 mg | SUBCUTANEOUS | Status: AC

## 2017-08-02 MED ORDER — CHLORHEXIDINE GLUCONATE 0.12% ORAL RINSE (MEDLINE KIT)
15.0000 mL | Freq: Two times a day (BID) | OROMUCOSAL | Status: DC
  Administered 2017-08-02: 15 mL via OROMUCOSAL

## 2017-08-02 MED ORDER — SUCCINYLCHOLINE CHLORIDE 20 MG/ML IJ SOLN
60.0000 mg | Freq: Once | INTRAMUSCULAR | Status: AC
  Administered 2017-08-02: 60 mg via INTRAVENOUS

## 2017-08-02 MED ORDER — FUROSEMIDE 10 MG/ML IJ SOLN
INTRAMUSCULAR | Status: AC
  Filled 2017-08-02: qty 4

## 2017-08-02 MED ORDER — CHLORHEXIDINE GLUCONATE 0.12 % MT SOLN: 15.0000 mL | Freq: Two times a day (BID) | OROMUCOSAL | 0 refills | Status: AC

## 2017-08-02 MED ORDER — FUROSEMIDE 10 MG/ML IJ SOLN
40.0000 mg | Freq: Once | INTRAMUSCULAR | Status: AC
  Administered 2017-08-02: 40 mg via INTRAVENOUS

## 2017-08-02 MED ORDER — NOREPINEPHRINE BITARTRATE 1 MG/ML IV SOLN: 0.0000 ug/min | INTRAVENOUS | Status: AC

## 2017-08-02 NOTE — Progress Notes (Signed)
PULMONARY / CRITICAL CARE MEDICINE   Name: Wendy [name] MRN: [id number] DOB: 1961-05-22    ADMISSION DATE:  07/26/2017 CONSULTATION DATE:  08/01/2017   CHIEF COMPLAINT: We are asked to see the patient today for progressive dyspnea, she was initially admitted for altered mental status  HISTORY OF PRESENT ILLNESS:   This is a 56 year old with a history of treated hepatitis C and a history of alcohol use from which she has reportedly  abstained.  She was initially admitted with altered mental status and was started on therapy for hepatic encephalopathy.  She was also somewhat short of breath and a CT scan of the chest was obtained which showed patchy bilateral infiltrates and a large right pleural effusion.  When her dyspnea progressed the right effusion was tapped and it contained 378 white cells and had an LDH of only 46.  She has been given Lasix in an attempt to mobilize what appears to thoracic  ascites but has not responded in fact her net balance for her hospitalization is almost 5 L positive.   She did not respond to Lasix infusion overnight and in fact became hypotensive and required the initiation of a Levophed infusion.  Her oxygenation has deteriorated and she was placed on BiPAP and despite the BiPAP saturations were less than 90%.  She was incoherent on my arrival this morning and subsequently required intubation and mechanical ventilation.  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  PAST SURGICAL HISTORY: She  has a past surgical history that includes ORIF hip fracture (2010); Upper gastrointestinal endoscopy (08/05/2007); Splenectomy; Trudee Kuster hole for subdural hematoma (2004); Upper gastrointestinal endoscopy (10/14/2011); Colonoscopy (08/2012); Kyphoplasty (Bilateral, 09/21/2013); Esophagogastroduodenoscopy (egd) with propofol (N/A, 01/26/2014); Nasal sinus surgery; and IR Paracentesis (07/28/2017).  Allergies  Allergen Reactions  . Codeine Phosphate Itching  . Codeine Rash    No current facility-administered medications on file prior to encounter.    Current Outpatient Medications on File Prior to Encounter  Medication Sig  . acetaminophen (TYLENOL 8 HOUR) 650 MG CR tablet Take 1 tablet (650 mg total) by mouth every 8 (eight) hours as needed for pain.  . bisacodyl ([employer] GENTLE LAXATIVE) 5 MG EC tablet TAKE 1 TABLET (5 MG TOTAL) BY MOUTH DAILY. (Patient taking differently: Take 5 mg by mouth daily. )  . CHANTIX 0.5 MG tablet TAKE 1 TABLET BY MOUTH TWICE A DAY  . clonazePAM (KLONOPIN) 1 MG tablet Take 0.5-1 tablets (0.5-1 mg total) by mouth 2 (two) times daily as needed for anxiety.  . docusate sodium (COLACE) 100 MG capsule TAKE 1 CAPSULE BY MOUTH TWICE A DAY (Patient taking differently: TAKE 100mg  BY MOUTH TWICE A DAY)  . escitalopram (LEXAPRO) 20 MG tablet Take 0.5 tablets (10 mg total) daily by mouth. Stop the 10mg  tablets. Dose increased to 20mg  today  . furosemide (LASIX) 40 MG tablet Take 2 tablets (80 mg total) by mouth daily.  Marland Kitchen gabapentin (NEURONTIN) 300 MG capsule  Take 1 capsule (300 mg total) by mouth 3 (three) times daily.  . hydrOXYzine (ATARAX/VISTARIL) 25 MG tablet TAKE 1 TO 2 TABLETS BY MOUTH TWICE A  DAY AS NEEDED FOR ITCHING (Patient taking differently: TAKE 25mg  to 50mg  MOUTH TWICE A DAY AS NEEDED FOR ITCHING)  . levothyroxine (SYNTHROID, LEVOTHROID) 200 MCG tablet Take 1 tablet (200 mcg total) by mouth daily before breakfast.  . loratadine (CLARITIN) 10 MG tablet TAKE 1 TABLET BY MOUTH EVERY DAY (Patient taking differently: TAKE 10mg  BY MOUTH EVERY DAY)  . montelukast (SINGULAIR) 10 MG tablet TAKE 1 TABLET (10 MG TOTAL) BY MOUTH DAILY.  Marland Kitchen omeprazole (PRILOSEC) 20 MG capsule TAKE 1 CAPSULE (20 MG TOTAL) BY MOUTH DAILY.  Marland Kitchen propranolol (INDERAL) 60 MG tablet TAKE 1 TABLET (60 MG TOTAL) BY MOUTH 2 (TWO) TIMES DAILY.  Marland Kitchen QUEtiapine (SEROQUEL) 25 MG tablet Take 1 tablet (25 mg total) by mouth at bedtime.  . ranitidine (ZANTAC) 300 MG tablet TAKE 1 TABLET (300 MG TOTAL) BY MOUTH AT BEDTIME.  . rifaximin (XIFAXAN) 550 MG TABS tablet Take 1 tablet (550 mg total) by mouth 2 (two) times daily.  Marland Kitchen spironolactone (ALDACTONE) 100 MG tablet TAKE 1.5 TABLETS (150 MG TOTAL) BY MOUTH DAILY.  . traMADol (ULTRAM) 50 MG tablet Take 1 tablet (50 mg total) by mouth 2 (two) times daily.  . traZODone (DESYREL) 50 MG tablet TAKE 0.5 TABLETS (25 MG TOTAL) BY MOUTH AT BEDTIME AS NEEDED FOR SLEEP.  Marland Kitchen cycloSPORINE (RESTASIS) 0.05 % ophthalmic emulsion Place 1 drop into both eyes 2 (two) times daily. (Patient not taking: Reported on 07/27/2017)  . fluticasone (FLONASE) 50 MCG/ACT nasal spray Place 2 sprays into both nostrils daily. (Patient not taking: Reported on 07/27/2017)  . lactulose (CHRONULAC) 10 GM/15ML solution Take 30 mLs (20 g total) 3 (three) times daily by mouth. Take 2 tbsp by mouth 2 times a day (Patient not taking: Reported on 07/27/2017)  . spironolactone (ALDACTONE) 100 MG tablet Take 2 tablets (200 mg total) by mouth daily. (Patient not taking: Reported on 07/27/2017)    FAMILY HISTORY:  Her indicated that her mother is deceased. She indicated that her father is alive. She indicated that her sister is  deceased. She indicated that the status of her maternal grandmother is unknown. She indicated that the status of her neg hx is unknown.   SOCIAL HISTORY: She  reports that she quit smoking about 4 weeks ago. Her smoking use included cigarettes. She has a 2.50 pack-year smoking history. she has never used smokeless tobacco. She reports that she does not drink alcohol or use drugs.  REVIEW OF SYSTEMS:   Not obtainable  SUBJECTIVE:  Not obtainable  VITAL SIGNS: BP 106/81   Pulse (!) 102   Temp 97.6 F (36.4 C) (Axillary)   Resp 12   Ht 5\' 6"  (1.676 m)   Wt 216 lb 0.8 oz (98 kg)   SpO2 93%   BMI 34.87 kg/m   HEMODYNAMICS: CVP:  [13 mmHg-16 mmHg] 13 mmHg  VENTILATOR SETTINGS: Vent Mode: PRVC FiO2 (%):  [50 %-100 %] 100 % Set Rate:  [12 bmp] 12 bmp Vt Set:  [500 mL] 500 mL PEEP:  [12 cmH20] 12 cmH20 Plateau Pressure:  [23 cmH20] 23 cmH20  INTAKE / OUTPUT: I/O last 3 completed shifts: In: 1589.5 [I.V.:339.5; IV ENMMHWKGS:8110] Out: 370 [Urine:370]  PHYSICAL EXAMINATION: General: He is intermittently agitated and difficult to direct. Neuro: Pupils are equal at 4 mm and she is moving all fours.  She is not giving consistent responses to questions Cardiovascular: Irregular with a soft S4.  I do not appreciate a murmur Lungs: She is slightly tachypneic even on BiPAP.  There is symmetric air movement anteriorly, no wheezes. Abdomen: The abdomen is somewhat distended without any overt fluid wave organomegaly masses tenderness guarding or rebound Skin: She has anasarca  with weeping from her skin.  LABS:  BMET Recent Labs  Lab 08/01/17 0514 08/01/17 2203 08/02/17 0537  NA 135 136 134*  K 3.4* 3.5 3.4*  CL 103 106 104  CO2 18* 20* 20*  BUN 26* 29* 33*  CREATININE 1.60* 1.62* 1.69*  GLUCOSE 106* 103* 187*    Electrolytes Recent Labs  Lab 08/01/17 0514 08/01/17 2203 08/02/17 0537  CALCIUM 8.5* 8.3* 8.3*  MG  --  1.7 2.3    CBC Recent Labs  Lab  07/30/17 0402 08/01/17 1850 08/02/17 0537  WBC 17.0* 18.3* 17.6*  HGB 9.2* 8.5* 7.6*  HCT 27.6* 24.9* 22.5*  PLT 249 174 163    Coag's Recent Labs  Lab 07/26/17 2042 07/30/17 0912  INR 1.69 1.94    Sepsis Markers Recent Labs  Lab 07/30/17 1529 07/30/17 1844 07/31/17 0318 08/01/17 0514 08/01/17 1853 08/01/17 2203  LATICACIDVEN 2.8* 2.7*  --   --  2.7* 2.2*  PROCALCITON 0.21  --  0.27 0.28  --   --     ABG Recent Labs  Lab 07/28/17 1555 07/30/17 0409 08/02/17 1104  PHART 7.442 7.449 7.295*  PCO2ART 27.0* 25.3* 40.3  PO2ART 78.0* 74.1* 288.0*    Liver Enzymes Recent Labs  Lab 07/30/17 0402 07/31/17 0318 08/02/17 0537  AST 69* 84* 78*  ALT 38 37 35  ALKPHOS 113 90 89  BILITOT 1.8* 2.0* 1.9*  ALBUMIN 2.1* 1.8* 2.0*    Cardiac Enzymes Recent Labs  Lab 07/27/17 0312  TROPONINI <0.03    Glucose Recent Labs  Lab 07/27/17 0450  GLUCAP 110*    Imaging Dg Chest Port 1 View  Result Date: 08/02/2017 CLINICAL DATA:  Endotracheal tube placement. Hypoxia. History of hypertension, pleural effusion and ascites. EXAM: PORTABLE CHEST 1 VIEW COMPARISON:  Earlier the same date and 08/01/2017. FINDINGS: 0941 hour. Interval intubation. Tip of the endotracheal tube is in the midtrachea, 2.6 cm above the carina. Right IJ central venous catheter is unchanged at the level of the azygos vein. The heart size and mediastinal contours are stable. There are stable bilateral airspace opacities and a small right pleural effusion. No evidence of pneumothorax. IMPRESSION: Satisfactory position of the endotracheal tube. No significant change in bilateral airspace opacities. Electronically Signed   By: Richardean Sale M.D.   On: 08/02/2017 10:10   Dg Chest Port 1 View  Result Date: 08/02/2017 CLINICAL DATA:  Hypoxia EXAM: PORTABLE CHEST 1 VIEW COMPARISON:  August 01, 2017 FINDINGS: The heart size and mediastinal contours are stable. Right jugular central venous line is  identified distal tip in superior vena cava. Diffuse patchy consolidations identified throughout bilateral lungs worse compared prior exam. There is a moderate right pleural effusion unchanged. Minimal right pleural effusion is noted. The visualized skeletal structures are stable. IMPRESSION: Diffuse patchy consolidation throughout bilateral lungs, worse compared prior exam. Differential diagnosis includes ARDS, pulmonary edema, diffuse pneumonia/pneumonitis. Moderate right pleural effusion unchanged. Electronically Signed   By: Abelardo Diesel M.D.   On: 08/02/2017 07:44   Dg Chest Port 1 View  Result Date: 08/01/2017 CLINICAL DATA:  Central catheter placement EXAM: PORTABLE CHEST 1 VIEW COMPARISON:  July 31, 2017 FINDINGS: Central catheter tip is in the superior vena cava.  No pneumothorax. There is extensive new airspace consolidation in the right upper and right mid lung regions. There is a moderate pleural effusion on the right. There is underlying pulmonary edema throughout the left lung, similar to 1 day prior. Heart is normal in size and contour. No adenopathy. IMPRESSION: Central catheter tip in superior vena cava. Airspace consolidation concerning for pneumonia right upper and mid lung zones. Suspect underlying ARDS given what most likely represents underlying noncardiogenic edema. There is a pleural effusion on the right. Cardiac silhouette appears within normal limits. Electronically Signed   By: Lowella Grip III M.D.   On: 08/01/2017 20:28     ANTIBIOTICS: Switched to Doxycycline and Zosyn 12/9  SIGNIFICANT EVENTS: Thoracentesis 12/7 378 WBC's but LDH only 46    DISCUSSION: This is a 56 year old with advanced liver disease secondary to hepatitis C which has been treated and a history of alcohol use.  She is currently abstinent.  She was admitted with altered mental status and has had difficulties with dyspnea throughout her hospital course.  A CT scan of the chest initially showed  a large right pleural effusion and some patchy infiltrates..  Treatment with Unasyn for potential aspiration and diuresis for what appeared to be thoracic ascites did not improve her dyspnea.  Overnight she was on BiPAP and despite the BiPAP was relatively hypoxic this morning and required intubation and mechanical ventilation.  Chest x-ray is consistent with ARDS  ASSESSMENT / PLAN:  PULMONARY A: She has diffuse bilateral infiltrates and an echocardiogram that to my eye suggest hyperdynamic LV function.  This appears to be an ARDS picture without an overt provocation.  I have reviewed her recent cultures and we have isolated no pathogens.  A respiratory virus PCR panel has been ordered, if transfer is not accomplished in a timely manner today I will recap her abdomen looking for atypical SBP organisms including fungi.   GASTROINTESTINAL A: She is not a candidate for a TIPS procedure for relief of her ascites as she is already encephalopathic.  Her beta-blocker for portal vein hypertension is currently on hold due to hemodynamic instability  Hemodynamics: It would appear that she has a high cardiac output low SVR state typical of both advanced liver disease and sepsis.  Is broadly covered with antibiotics combination of meropenem and doxycycline.  Edging by straight leg raise she is adequate volume loaded we are supporting her vascular resistance with levophed.  Greater than 35 minutes has been spent in the care of this patient today not counting time spent performing procedures.   Lars Masson, MD Pulmonary and [place] Pager: [phone]  08/02/2017, 12:00 PM

## 2017-08-02 NOTE — Procedures (Signed)
Intubation Procedure Note LATTIE RIEGE [id number] 1960-12-17  Procedure: Intubation Indications: Airway protection and maintenance  Procedure Details Consent: Risks of procedure as well as the alternatives and risks of each were explained to the (patient/caregiver).  Consent for procedure obtained. Time Out: Verified patient identification, verified procedure, site/side was marked, verified correct patient position, special equipment/implants available, medications/allergies/relevent history reviewed, required imaging and test results available.  Performed  Maximum sterile technique was used including gloves and hand hygiene.  Miller and 3    Evaluation Hemodynamic Status: BP stable throughout; O2 sats: stable throughout Patient's Current Condition: stable Complications: No apparent complications Patient did tolerate procedure well. Chest X-ray ordered to verify placement.  CXR: pending.   Mcneil Sober 08/02/2017

## 2017-08-02 NOTE — Progress Notes (Signed)
Daily Rounding Note  08/02/2017, 8:01 AM  LOS: 6 days   SUBJECTIVE:  Progressive dyspnea.  Chief complaint: lost peripheral IV access so CCM placed central line placed last night.   Just now ETT placed and on vent, A-line placed  Readying pt for transport to [facility] ICU, hepatology.   Urine output yesterday: 370 ml plus 4 unmeasured episodes urination.    OBJECTIVE:         Vital signs in last 24 hours:    Temp:  [97.6 F (36.4 C)-98.1 F (36.7 C)] 97.6 F (36.4 C) (12/10 0801) Pulse Rate:  [86-115] 91 (12/10 0630) Resp:  [21-47] 31 (12/10 0630) BP: (73-118)/(38-89) 93/73 (12/10 0630) SpO2:  [82 %-97 %] 93 % (12/10 0630) FiO2 (%):  [50 %-60 %] 60 % (12/10 0400) Weight:  [98 kg (216 lb 0.8 oz)] 98 kg (216 lb 0.8 oz) (12/10 0443) Last BM Date: 08/01/17 Filed Weights   08/01/17 0442 08/01/17 2000 08/02/17 0443  Weight: 101.8 kg (224 lb 6.9 oz) 98 kg (216 lb 0.8 oz) 98 kg (216 lb 0.8 oz)   General: looks pale and ill.  Did not manually examine pt who has just been intubated and had line placed   Heart: tachy, regular at 116.   Chest: intubated.  Not auscultated Abdomen: obese, soft.   Neuro/Psych:  Sedated, on vent  Intake/Output from previous day: 12/09 0701 - 12/10 0700 In: 1239.5 [I.V.:339.5; IV Piggyback:900] Out: 370 [Urine:370]  Intake/Output this shift: No intake/output data recorded.  Lab Results: Recent Labs    08/01/17 1850 08/02/17 0537  WBC 18.3* 17.6*  HGB 8.5* 7.6*  HCT 24.9* 22.5*  PLT 174 163   BMET Recent Labs    08/01/17 0514 08/01/17 2203 08/02/17 0537  NA 135 136 134*  K 3.4* 3.5 3.4*  CL 103 106 104  CO2 18* 20* 20*  GLUCOSE 106* 103* 187*  BUN 26* 29* 33*  CREATININE 1.60* 1.62* 1.69*  CALCIUM 8.5* 8.3* 8.3*   LFT Recent Labs    07/31/17 0318 08/02/17 0537  PROT 5.9* 6.3*  ALBUMIN 1.8* 2.0*  AST 84* 78*  ALT 37 35  ALKPHOS 90 89  BILITOT 2.0* 1.9*   PT/INR Recent  Labs    07/30/17 0912  LABPROT 22.0*  INR 1.94   Hepatitis Panel No results for input(s): HEPBSAG, HCVAB, HEPAIGM, HEPBIGM in the last 72 hours.  Studies/Results: Dg Chest Port 1 View  Result Date: 08/02/2017 CLINICAL DATA:  Hypoxia EXAM: PORTABLE CHEST 1 VIEW COMPARISON:  August 01, 2017 FINDINGS: The heart size and mediastinal contours are stable. Right jugular central venous line is identified distal tip in superior vena cava. Diffuse patchy consolidations identified throughout bilateral lungs worse compared prior exam. There is a moderate right pleural effusion unchanged. Minimal right pleural effusion is noted. The visualized skeletal structures are stable. IMPRESSION: Diffuse patchy consolidation throughout bilateral lungs, worse compared prior exam. Differential diagnosis includes ARDS, pulmonary edema, diffuse pneumonia/pneumonitis. Moderate right pleural effusion unchanged. Electronically Signed   By: Abelardo Diesel M.D.   On: 08/02/2017 07:44   Dg Chest Port 1 View  Result Date: 08/01/2017 CLINICAL DATA:  Central catheter placement EXAM: PORTABLE CHEST 1 VIEW COMPARISON:  July 31, 2017 FINDINGS: Central catheter tip is in the superior vena cava.  No pneumothorax. There is extensive new airspace consolidation in the right upper and right mid lung regions. There is a moderate pleural effusion on the right. There  is underlying pulmonary edema throughout the left lung, similar to 1 day prior. Heart is normal in size and contour. No adenopathy. IMPRESSION: Central catheter tip in superior vena cava. Airspace consolidation concerning for pneumonia right upper and mid lung zones. Suspect underlying ARDS given what most likely represents underlying noncardiogenic edema. There is a pleural effusion on the right. Cardiac silhouette appears within normal limits. Electronically Signed   By: Lowella Grip III M.D.   On: 08/01/2017 20:28   Dg Chest Port 1 View  Result Date:  07/31/2017 CLINICAL DATA:  56 year old female with a right pleural effusion. EXAM: PORTABLE CHEST 1 VIEW COMPARISON:  07/30/2017 FINDINGS: Cardiomediastinal silhouette is obscured but grossly unchanged in size and morphology. There is a continued right-sided pleural effusion, similar in size from prior study. Diffuse left-sided airspace disease persists, also similar. No sizable left effusion. No pneumothorax. No acute osseous abnormalities. IMPRESSION: Grossly stable appearance of the chest demonstrating diffuse left-sided airspace disease and a right basilar pleural effusion. Electronically Signed   By: Kristopher Oppenheim M.D.   On: 07/31/2017 12:39    Scheduled Meds: . chlorhexidine  15 mL Mouth Rinse BID  . enoxaparin (LOVENOX) injection  40 mg Subcutaneous Q24H  . folic acid  1 mg Intravenous Daily  . ipratropium-albuterol  3 mL Nebulization Q6H  . lactulose  20 g Oral BID  . levothyroxine  100 mcg Intravenous Daily  . mouth rinse  15 mL Mouth Rinse q12n4p  . rifaximin  550 mg Oral BID  . sertraline  25 mg Oral Daily  . thiamine  100 mg Oral Daily   Or  . thiamine  100 mg Intravenous Daily   Continuous Infusions: . sodium chloride    . albumin human    . dexmedetomidine (PRECEDEX) IV infusion 0.4 mcg/kg/hr (08/02/17 0500)  . doxycycline (VIBRAMYCIN) IV Stopped (08/02/17 0544)  . furosemide (LASIX) infusion Stopped (08/01/17 2300)  . norepinephrine (LEVOPHED) Adult infusion 11 mcg/min (08/02/17 7341)  . piperacillin-tazobactam (ZOSYN)  IV 3.375 g (08/02/17 9379)   PRN Meds:.sodium chloride, haloperidol lactate, lidocaine, phenol   ASSESMENT:   *  Right  hepatic hydrothorax.  Progressive bil edema vs PNA vs ARDS.   High MELD, HE preclude TIPS (confirmed at d/w Dr Brigitte Pulse at [facility]).  On Lasix 100 mg IV BID and Aldactone 300 mg daily.   S/p ETT/Vent placement for transport to [facility].   *  Leukocytosis.  No fever.  IV doxycycline, Zosyn in place.    *  HE.  On Lactulose, Rifaximin.    *   CKD stage 3, slight bump up today.    *  Decompensated Cirrhosis.  Hep C (eradicated).  MELD 10 >> 21 from 10/2016 to present.    *  Ascites.  Moderate to large on 12/4 ultrasound.  Paracentesis 3.6 liters on 12/5.     *  Hypokalemia.    *  Macrocytic anemia.  B12, Folate, iron studies ok.  378 WBCs, 9% neutrophils.  No organisms on gram stain.  clx without growth at 2 days.    *  Hypokalemia.    *  Mild coagulopathy.    PLAN   *  Accepted for transfer to [facility], EMS to transport to ready bed.    *  Daily BMET but not inclined to lower diuretics yet.  Daily CBC,      Wendy [name]  08/02/2017, 8:01 AM Pager: [phone]

## 2017-08-02 NOTE — Progress Notes (Signed)
eLink Physician-Brief Progress Note Patient Name: Wendy [name] DOB: 12-22-1960 MRN: [id number]   Date of Service  08/02/2017  HPI/Events of Note  Patient intubated since this morning. No mechanical ventilation orders.   eICU Interventions  Will order: 1. Ventilator settings: 60%/PRVC 15/TV 500/P 12. Titrate FiO2 to keep sat >= 92%.     Intervention Category Major Interventions: Respiratory failure - evaluation and management  Sommer,Steven Eugene 08/02/2017, 8:39 PM

## 2017-08-02 NOTE — Progress Notes (Signed)
New bag of versed (unspiked) sent with [facility] for transport

## 2017-08-02 NOTE — Procedures (Signed)
Endotracheal intubation  After discussing the situation with Wendy [name]'s grandparents, the patient was intubated due to a failure of BiPAP to correct hypoxemia and to facilitate safe transport to [facility].  A timeout was performed and then the patient was preoxygenated with an Ambu bag.  She was sedated with 20 mg of etomidate followed by 60 mg of succinylcholine.  The cords were easily visualized with a 3 Miller blade and a 8 endotracheal tube passed to 22 cm at the teeth.  There was symmetric air movement and positive CO2.  A chest x-ray to confirm placement is pending

## 2017-08-02 NOTE — Progress Notes (Signed)
RESULTS ARE NOT CROSSING OVER INTO EPIC RESULTS VERBALLY GIVEN TO K. DEAL, RRT VBG RESULTS:  PH: 7.39 CO2: 33.6 O2: UNREADABLE HCO3: 20.3

## 2017-08-02 NOTE — Progress Notes (Signed)
Palliative Medicine RN Note: Consult order noted. Due to severe weather and resultant staffing issues, there may be a delay seeing this patient. If you require immediate recommendations, please call our office at [phone]  [place] G. Darly Fails, RN, BSN, CHPN 08/02/2017 8:31 AM  Office [phone] 

## 2017-08-02 NOTE — Procedures (Signed)
Arterial Line placement  Arterial line was placed in order to facilitate monitoring and transport of a hemodynamically unstable patient.  Indications had been discussed with family.  Timeout was performed.  The area over the left femoral vein was extensively cleansed with chlorhexidine, and the artery identified by palpation.  The artery was easily cannulated and a wire gently passed a 20-gauge A-line catheter was passed over the wire and sutured in place.  There was good tracing and flow.  There was no immediate complication.

## 2017-08-02 NOTE — Progress Notes (Signed)
  Echocardiogram 2D Echocardiogram has been performed.  Wendy [name] T Wendy [name] 08/02/2017, 11:52 AM

## 2017-08-02 NOTE — Discharge Summary (Signed)
Physician Discharge Summary  Patient ID: Wendy [name] MRN: [id number] DOB/AGE: 1961/05/31 56 y.o.  Admit date: 07/26/2017 Discharge date: 08/02/2017    Discharge Diagnoses:  Acute Encephalopathy  Mechanical Fall  Hepatitis C - eradicated, MELD 10 to 21 from 10/2016 to admit Decompensated Cirrhosis  ETOH Abuse Hx Esophageal Varices Ascites  Medical Non-Compliance Acute Hypoxic Respiratory Failure  Suspected Aspiration PNA with Diffuse Bilateral Infiltrates (suspect ARDS) Right Pleural Effusion / Hepatic Hydrothorax s/p Thoracentesis  Leukocytosis  CKD III  Hypokalemia  Coagulopathy  Macrocytic Anemia                                                                        DISCHARGE PLAN BY DIAGNOSIS     Acute Metabolic Encephalopathy  Mechanical Fall   Discharge Plan: Supportive care Fall precautions PT efforts when able    Hepatitis C - eradicated, MELD 10 to 21 from 10/2016 to admit Decompensated Cirrhosis  ETOH Abuse Hx Esophageal Varices Ascites  Medical Non-Compliance  Discharge Plan: Hold lasix, aldactone, propranolol due to hemodynamic instability  Appreciate GI input  [facility] for [facility] evaluation  May need repeat paracentesis for SBP eval  Acute Hypoxic Respiratory Failure  Suspected Aspiration PNA with Diffuse Bilateral Infiltrates (suspect ARDS) Right Pleural Effusion / Hepatic Hydrothorax s/p Thoracentesis  Leukocytosis   Discharge Plan: PRVC 8 cc/kg, rate 12 Wean PEEP / FiO2 for sats > 90%, current PEEP 12, FiO2 60% Follow pleural effusion  Intermittent CXR Continue antibiotics as below  Await RVP  CKD III  Hypokalemia   Discharge Plan: Trend BMP / urinary output Replace electrolytes as indicated Avoid nephrotoxic agents, ensure adequate renal perfusion  Coagulopathy  Macrocytic Anemia   Discharge Plan: Trend CBC, INR  Monitor for bleeding  SCD's for DVT prophylaxis                      DISCHARGE SUMMARY    56 y/o F with a history of treated hepatitis C, prior ETOH abuse (curently abstinent), esophageal varices, GERD, hiatal hernia, HTN, TBI, osteoporosis & anxiety with panic attacks who presented to [facility] on 07/26/17 from home with reports of an unwitnessed fall and altered mental status.    She had a recent admission to [facility] 11/3-11/6/18 for elevated ammonia levels and suspected pulmonary infection (left basilar infiltrate on CXR).    The patient was initially admitted for evaluation of altered mental status and started on therapy for hepatic encephalopathy.  GI was consulted.  She reported shortness of breath and a CT of the chest was obtained which showed bilateral patchy infiltrates and a large right pleural effusion.  As the course progressed, she developed worsening dyspnea and her pleural effusion was tapped which contained 378 WBC, LDH of 46 consistent with hepatic hydrothorax.  She was given lasix in an attempt to mobilize the effusion but unfortunately, she did not respond.  In fact, she remained in a positive balance.  Overnight 12/10, she developed worsening respiratory distress and hypotension.  She was transferred to the ICU and placed on BiPAP.  Despite BiPAP therapy, she worsened and required intubation.  CXR demonstrated diffuse bilateral infiltrates concerning for ARDS.  Given patients hepatic history, it  was felt she would be better served at a [employer] [facility] and she is followed at [facility] for potential transplant.  The patient was medically cleared for discharge to [facility] 12/10 when bed available.              SIGNIFICANT DIAGNOSTIC STUDIES CT Chest 12/5 >> large water density right pleural effusion with atelectatic changes in the RUL, near complete collapse of the RML and segmental collapse of the RLL.  Possible superimposed airspace consolidation in the right upper lobe. Mediastinal structures are displaced to the left due to the large right  pleural effusion.  Patchy peripheral airspace consolidation in the left upper lobe. Advanced for age marked calcific atherosclerotic disease of the coronary arteries.  Chest wall / abdominal wall edema.  ECHO 12/10 >> - Left ventricle: The cavity size was normal. There was moderate   focal basal hypertrophy of the septum. Systolic function was   vigorous. The estimated ejection fraction was in the range of 65%   to 70%. There was dynamic obstruction. There was dynamic   obstruction at rest, with a peak velocity of 85 cm/sec and a peak   gradient of 3 mm Hg. Wall motion was normal; there were no   regional wall motion abnormalities. Features are consistent with   a pseudonormal left ventricular filling pattern, with concomitant   abnormal relaxation and increased filling pressure (grade 2   diastolic dysfunction). Doppler parameters are consistent with   indeterminate ventricular filling pressure. - Aortic valve: Transvalvular velocity was within the normal range.   There was no stenosis. There was no regurgitation. - Mitral valve: Transvalvular velocity was within the normal range.   There was no evidence for stenosis. There was mild regurgitation. - Right ventricle: The cavity size was normal. Wall thickness was   normal. Systolic function was normal. - Tricuspid valve: There was mild regurgitation. - Pulmonary arteries: Systolic pressure was mildly increased. PA   peak pressure: 42 mm Hg (S). - Pericardium, extracardiac: A large, partially loculated   pericardial effusion was identified anterior to the heart.   Features were not consistent with tamponade physiology. Ascites   was noted.  EVENTS 12/03  Admit  12/05  Paracentesis with 3.6L fluid removed  12/07  Thora - appears to be transudate, WBC 378, Lymph 18, glucose 141, LD 46 (protein not done)  MICRO DATA  Peritoneal fluid 12/5 >>  R Pleural 12/7 >>  Sputum 12/10 >>  Respiratory Viral Panel 12/10 >>  Blood Cultures x2 12/10  >>   ANTIBIOTICS Unasyn 12/7 >> 12/9 Azithromycin 12/7 >> 12/9  Doxycycline 12/9 >> Zosyn 12/9 >>   CONSULTS GI - Dr. Owens Loffler   TUBES / LINES R IJ TLC 12/9 >>  ETT 12/10 >>    Discharge Exam: General: chronically ill appearing female on mechanical ventilation  Neuro: sedate on versed, pupils 55m sluggish  CV: s1s2 rrr, SR on monitor in 90's PULM: even/non-labored on vent, lungs bilaterally coarse GI: protuberant abdomen, fluid wave Extremities: warm/dry, 1+ generalized edema   Vitals:   08/02/17 0830 08/02/17 0845 08/02/17 0900 08/02/17 0915  BP: 92/63 (!) 87/43 (!) 84/45 106/81  Pulse: 90 92 96 (!) 102  Resp: (!) 29 (!) 31 (!) 21 12  Temp:      TempSrc:      SpO2: 92% 92% 94% 93%  Weight:      Height:         Discharge Labs  BMET Recent Labs  Lab 07/30/17 0402 07/31/17 0318 08/01/17 0514 08/01/17 2203 08/02/17 0537  NA 136 135 135 136 134*  K 3.7 3.5 3.4* 3.5 3.4*  CL 108 106 103 106 104  CO2 17* 19* 18* 20* 20*  GLUCOSE 164* 78 106* 103* 187*  BUN 21* 23* 26* 29* 33*  CREATININE 1.49* 1.52* 1.60* 1.62* 1.69*  CALCIUM 8.6* 8.1* 8.5* 8.3* 8.3*  MG  --   --   --  1.7 2.3    CBC Recent Labs  Lab 07/30/17 0402 08/01/17 1850 08/02/17 0537  HGB 9.2* 8.5* 7.6*  HCT 27.6* 24.9* 22.5*  WBC 17.0* 18.3* 17.6*  PLT 249 174 163    Anti-Coagulation Recent Labs  Lab 07/26/17 2042 07/30/17 0912  INR 1.69 1.94    Discharge Instructions    Discharge instructions   Complete by:  As directed    Continue all lines / tubes for transfer to [facility]      Allergies as of 08/02/2017      Reactions   Codeine Phosphate Itching   Codeine Rash      Medication List    STOP taking these medications   bisacodyl 5 MG EC tablet Commonly known as:  [employer] GENTLE LAXATIVE   CHANTIX 0.5 MG tablet Generic drug:  varenicline   clonazePAM 1 MG tablet Commonly known as:  KLONOPIN   fluticasone 50 MCG/ACT nasal spray Commonly known as:  FLONASE    furosemide 40 MG tablet Commonly known as:  LASIX   gabapentin 300 MG capsule Commonly known as:  NEURONTIN   hydrOXYzine 25 MG tablet Commonly known as:  ATARAX/VISTARIL   levothyroxine 200 MCG tablet Commonly known as:  SYNTHROID, LEVOTHROID Replaced by:  levothyroxine 100 MCG Solr injection   loratadine 10 MG tablet Commonly known as:  CLARITIN   montelukast 10 MG tablet Commonly known as:  SINGULAIR   omeprazole 20 MG capsule Commonly known as:  PRILOSEC   propranolol 60 MG tablet Commonly known as:  INDERAL   QUEtiapine 25 MG tablet Commonly known as:  SEROQUEL   ranitidine 300 MG tablet Commonly known as:  ZANTAC   spironolactone 100 MG tablet Commonly known as:  ALDACTONE   traMADol 50 MG tablet Commonly known as:  ULTRAM   traZODone 50 MG tablet Commonly known as:  DESYREL     TAKE these medications   acetaminophen 650 MG CR tablet Commonly known as:  TYLENOL 8 HOUR Take 1 tablet (650 mg total) by mouth every 8 (eight) hours as needed for pain.   albumin human 25 % bottle Inject 50 mLs (12.5 g total) into the vein every 6 (six) hours.   chlorhexidine 0.12 % solution Commonly known as:  PERIDEX 15 mLs by Mouth Rinse route 2 (two) times daily.   cycloSPORINE 0.05 % ophthalmic emulsion Commonly known as:  RESTASIS Place 1 drop into both eyes 2 (two) times daily.   docusate sodium 100 MG capsule Commonly known as:  COLACE TAKE 1 CAPSULE BY MOUTH TWICE A DAY What changed:    how much to take  how to take this  when to take this   doxycycline 100 mg in dextrose 5 % 250 mL Inject 100 mg into the vein every 12 (twelve) hours.   enoxaparin 40 MG/0.4ML injection Commonly known as:  LOVENOX Inject 0.4 mLs (40 mg total) into the skin daily.   escitalopram 20 MG tablet Commonly known as:  LEXAPRO Take 0.5 tablets (10 mg total) daily by mouth. Stop the  4m tablets. Dose increased to 232NVtoday   folic acid 5 MG/ML injection Inject 0.2 mLs  (1 mg total) into the vein daily. Start taking on:  08/03/2017   furosemide 250 mg in dextrose 5 % 225 mL Inject 4 mg/hr into the vein continuous.   ipratropium-albuterol 0.5-2.5 (3) MG/3ML Soln Commonly known as:  DUONEB Take 3 mLs by nebulization every 6 (six) hours.   lactulose 10 GM/15ML solution Commonly known as:  CHRONULAC Take 30 mLs (20 g total) by mouth 2 (two) times daily. What changed:    when to take this  additional instructions   levothyroxine 100 MCG Solr injection Commonly known as:  SYNTHROID, LEVOTHROID Inject 5 mLs (100 mcg total) into the vein daily. Start taking on:  08/03/2017 Replaces:  levothyroxine 200 MCG tablet   midazolam 50 mg in sodium chloride 0.9 % 40 mL Inject 2-10 mg/hr into the vein continuous.   mouth rinse Liqd solution 15 mLs by Mouth Rinse route 2 times daily at 12 noon and 4 pm.   norepinephrine 4 mg in dextrose 5 % 250 mL Inject 0-40 mcg/min into the vein continuous.   piperacillin-tazobactam 3.375 GM/50ML IVPB Commonly known as:  ZOSYN Inject 50 mLs (3.375 g total) into the vein every 8 (eight) hours.   rifaximin 550 MG Tabs tablet Commonly known as:  XIFAXAN Take 1 tablet (550 mg total) by mouth 2 (two) times daily.   sodium chloride 0.9 % infusion Inject 250 mLs into the vein as needed (if IV carrier fluid needed.).   thiamine 100 MG tablet Take 1 tablet (100 mg total) by mouth daily. Start taking on:  08/03/2017        [facility]   Discharged Condition: KMADDYN LIEURANCEhas met maximum benefit of inpatient care and is medically stable and cleared for discharge.  Patient is pending follow up as above.      Time spent on disposition:  35 Minutes.   Signed: BNoe Gens NP-C [facility] Pgr: [phone] Office: [phone]

## 2017-08-03 DIAGNOSIS — I10 Essential (primary) hypertension: Secondary | ICD-10-CM | POA: Diagnosis not present

## 2017-08-03 DIAGNOSIS — I4581 Long QT syndrome: Secondary | ICD-10-CM | POA: Diagnosis not present

## 2017-08-03 DIAGNOSIS — D696 Thrombocytopenia, unspecified: Secondary | ICD-10-CM | POA: Diagnosis not present

## 2017-08-03 DIAGNOSIS — J188 Other pneumonia, unspecified organism: Secondary | ICD-10-CM | POA: Diagnosis not present

## 2017-08-03 DIAGNOSIS — D649 Anemia, unspecified: Secondary | ICD-10-CM | POA: Diagnosis not present

## 2017-08-03 DIAGNOSIS — G9341 Metabolic encephalopathy: Secondary | ICD-10-CM | POA: Diagnosis not present

## 2017-08-03 DIAGNOSIS — Z8619 Personal history of other infectious and parasitic diseases: Secondary | ICD-10-CM | POA: Diagnosis not present

## 2017-08-03 DIAGNOSIS — K7201 Acute and subacute hepatic failure with coma: Secondary | ICD-10-CM | POA: Diagnosis not present

## 2017-08-03 DIAGNOSIS — D689 Coagulation defect, unspecified: Secondary | ICD-10-CM | POA: Diagnosis not present

## 2017-08-03 DIAGNOSIS — K7290 Hepatic failure, unspecified without coma: Secondary | ICD-10-CM | POA: Diagnosis not present

## 2017-08-03 DIAGNOSIS — R9431 Abnormal electrocardiogram [ECG] [EKG]: Secondary | ICD-10-CM | POA: Diagnosis not present

## 2017-08-03 DIAGNOSIS — E039 Hypothyroidism, unspecified: Secondary | ICD-10-CM | POA: Diagnosis not present

## 2017-08-03 DIAGNOSIS — D539 Nutritional anemia, unspecified: Secondary | ICD-10-CM | POA: Diagnosis not present

## 2017-08-03 DIAGNOSIS — I8500 Esophageal varices without bleeding: Secondary | ICD-10-CM | POA: Diagnosis not present

## 2017-08-03 DIAGNOSIS — Z7983 Long term (current) use of bisphosphonates: Secondary | ICD-10-CM | POA: Diagnosis not present

## 2017-08-03 DIAGNOSIS — G4733 Obstructive sleep apnea (adult) (pediatric): Secondary | ICD-10-CM | POA: Diagnosis not present

## 2017-08-03 DIAGNOSIS — J90 Pleural effusion, not elsewhere classified: Secondary | ICD-10-CM | POA: Diagnosis not present

## 2017-08-03 DIAGNOSIS — E872 Acidosis: Secondary | ICD-10-CM | POA: Diagnosis not present

## 2017-08-03 DIAGNOSIS — R140 Abdominal distension (gaseous): Secondary | ICD-10-CM | POA: Diagnosis not present

## 2017-08-03 DIAGNOSIS — J9601 Acute respiratory failure with hypoxia: Secondary | ICD-10-CM | POA: Diagnosis not present

## 2017-08-03 DIAGNOSIS — Z515 Encounter for palliative care: Secondary | ICD-10-CM | POA: Diagnosis not present

## 2017-08-03 DIAGNOSIS — R197 Diarrhea, unspecified: Secondary | ICD-10-CM | POA: Diagnosis not present

## 2017-08-03 DIAGNOSIS — I313 Pericardial effusion (noninflammatory): Secondary | ICD-10-CM | POA: Diagnosis not present

## 2017-08-03 DIAGNOSIS — K766 Portal hypertension: Secondary | ICD-10-CM | POA: Diagnosis not present

## 2017-08-03 DIAGNOSIS — E876 Hypokalemia: Secondary | ICD-10-CM | POA: Diagnosis not present

## 2017-08-03 DIAGNOSIS — J690 Pneumonitis due to inhalation of food and vomit: Secondary | ICD-10-CM | POA: Diagnosis not present

## 2017-08-03 DIAGNOSIS — N179 Acute kidney failure, unspecified: Secondary | ICD-10-CM | POA: Diagnosis not present

## 2017-08-03 DIAGNOSIS — I129 Hypertensive chronic kidney disease with stage 1 through stage 4 chronic kidney disease, or unspecified chronic kidney disease: Secondary | ICD-10-CM | POA: Diagnosis not present

## 2017-08-03 DIAGNOSIS — J948 Other specified pleural conditions: Secondary | ICD-10-CM | POA: Diagnosis not present

## 2017-08-03 DIAGNOSIS — R0902 Hypoxemia: Secondary | ICD-10-CM | POA: Diagnosis not present

## 2017-08-03 DIAGNOSIS — A419 Sepsis, unspecified organism: Secondary | ICD-10-CM | POA: Diagnosis not present

## 2017-08-03 DIAGNOSIS — I361 Nonrheumatic tricuspid (valve) insufficiency: Secondary | ICD-10-CM | POA: Diagnosis not present

## 2017-08-03 DIAGNOSIS — I4589 Other specified conduction disorders: Secondary | ICD-10-CM | POA: Diagnosis not present

## 2017-08-03 DIAGNOSIS — R6521 Severe sepsis with septic shock: Secondary | ICD-10-CM | POA: Diagnosis not present

## 2017-08-03 DIAGNOSIS — J811 Chronic pulmonary edema: Secondary | ICD-10-CM | POA: Diagnosis not present

## 2017-08-03 DIAGNOSIS — R918 Other nonspecific abnormal finding of lung field: Secondary | ICD-10-CM | POA: Diagnosis not present

## 2017-08-03 DIAGNOSIS — N183 Chronic kidney disease, stage 3 (moderate): Secondary | ICD-10-CM | POA: Diagnosis not present

## 2017-08-03 DIAGNOSIS — I959 Hypotension, unspecified: Secondary | ICD-10-CM | POA: Diagnosis not present

## 2017-08-03 DIAGNOSIS — M810 Age-related osteoporosis without current pathological fracture: Secondary | ICD-10-CM | POA: Diagnosis not present

## 2017-08-03 DIAGNOSIS — Z66 Do not resuscitate: Secondary | ICD-10-CM | POA: Diagnosis not present

## 2017-08-03 DIAGNOSIS — J918 Pleural effusion in other conditions classified elsewhere: Secondary | ICD-10-CM | POA: Diagnosis not present

## 2017-08-03 LAB — BODY FLUID CULTURE: Culture: NO GROWTH

## 2017-08-03 MED ORDER — PROPOFOL 100 MG/10ML IV EMUL
.00 | INTRAVENOUS | Status: DC
Start: ? — End: 2017-08-03

## 2017-08-03 MED ORDER — FENTANYL CITRATE 2500 MCG/50ML IV SOLN
50.00 | INTRAVENOUS | Status: DC
Start: ? — End: 2017-08-03

## 2017-08-03 MED ORDER — FAMOTIDINE 20 MG PO TABS
20.00 mg | ORAL_TABLET | ORAL | Status: DC
Start: 2017-08-03 — End: 2017-08-03

## 2017-08-03 MED ORDER — CEFEPIME HCL 2 GM/100ML IV SOLN
2.00 | INTRAVENOUS | Status: DC
Start: 2017-08-03 — End: 2017-08-03

## 2017-08-03 MED ORDER — GENERIC EXTERNAL MEDICATION
0.04 | Status: DC
Start: ? — End: 2017-08-03

## 2017-08-03 MED ORDER — LACTULOSE 20 G PO PACK
20.00 | PACK | ORAL | Status: DC
Start: 2017-08-03 — End: 2017-08-03

## 2017-08-03 MED ORDER — CHLORHEXIDINE GLUCONATE 0.12 % MT SOLN
10.00 | OROMUCOSAL | Status: DC
Start: 2017-08-03 — End: 2017-08-03

## 2017-08-03 MED ORDER — FENTANYL CITRATE (PF) 2500 MCG/50ML IJ SOLN
50.00 | INTRAMUSCULAR | Status: DC
Start: ? — End: 2017-08-03

## 2017-08-03 MED ORDER — FOLIC ACID 1 MG PO TABS
1.00 mg | ORAL_TABLET | ORAL | Status: DC
Start: 2017-08-03 — End: 2017-08-03

## 2017-08-03 MED ORDER — NOREPINEPHRINE BITARTRATE IV
0.00 | INTRAVENOUS | Status: DC
Start: ? — End: 2017-08-03

## 2017-08-03 MED ORDER — THIAMINE HCL 100 MG PO TABS
100.00 mg | ORAL_TABLET | ORAL | Status: DC
Start: 2017-08-03 — End: 2017-08-03

## 2017-08-03 MED ORDER — RIFAXIMIN 550 MG PO TABS
550.00 mg | ORAL_TABLET | ORAL | Status: DC
Start: 2017-08-03 — End: 2017-08-03

## 2017-08-03 MED ORDER — ORAL CARE MOUTH RINSE
15.0000 mL | Freq: Two times a day (BID) | OROMUCOSAL | 0 refills | Status: AC
Start: 2017-08-03 — End: 2017-08-08

## 2017-08-03 MED ORDER — GENERIC EXTERNAL MEDICATION
Status: DC
Start: ? — End: 2017-08-03

## 2017-08-03 MED ORDER — CHLORHEXIDINE GLUCONATE 0.12% ORAL RINSE (MEDLINE KIT): 15.0000 mL | Freq: Two times a day (BID) | OROMUCOSAL | 0 refills | Status: AC

## 2017-08-03 MED ORDER — LEVOTHYROXINE SODIUM 100 MCG PO TABS
200.00 | ORAL_TABLET | ORAL | Status: DC
Start: 2017-08-04 — End: 2017-08-03

## 2017-08-03 NOTE — Progress Notes (Signed)
Pt transferred to [facility] stretcher 

## 2017-08-03 NOTE — Progress Notes (Signed)
Pt family called in regards to being transferred

## 2017-08-06 ENCOUNTER — Inpatient Hospital Stay: Payer: Medicare Other | Admitting: Internal Medicine

## 2017-08-06 MED FILL — Fentanyl Citrate Preservative Free (PF) Inj 100 MCG/2ML: INTRAMUSCULAR | Qty: 2 | Status: AC

## 2017-08-07 LAB — CULTURE, BLOOD (ROUTINE X 2)
CULTURE: NO GROWTH
Culture: NO GROWTH
Special Requests: ADEQUATE

## 2017-08-24 DEATH — deceased

## 2017-08-30 NOTE — Telephone Encounter (Addendum)
Certified dismissal letter returned as undeliverable, unclaimed, return to sender after three attempts by [employer] on August 30, 2017. Letter placed in another envelope and resent as 1st class mail which does not require a signature. °daj °

## 2017-09-10 ENCOUNTER — Ambulatory Visit: Payer: Self-pay | Admitting: Internal Medicine

## 2017-09-27 ENCOUNTER — Ambulatory Visit: Payer: Self-pay | Admitting: Neurology

## 2019-05-13 IMAGING — DX DG CHEST 2V
2 series · 2 of 2 positions shown · non-contrast
Comparison: June 26, 2017

CLINICAL DATA: Pneumonia

EXAM:
CHEST  2 VIEW

[chest lat]
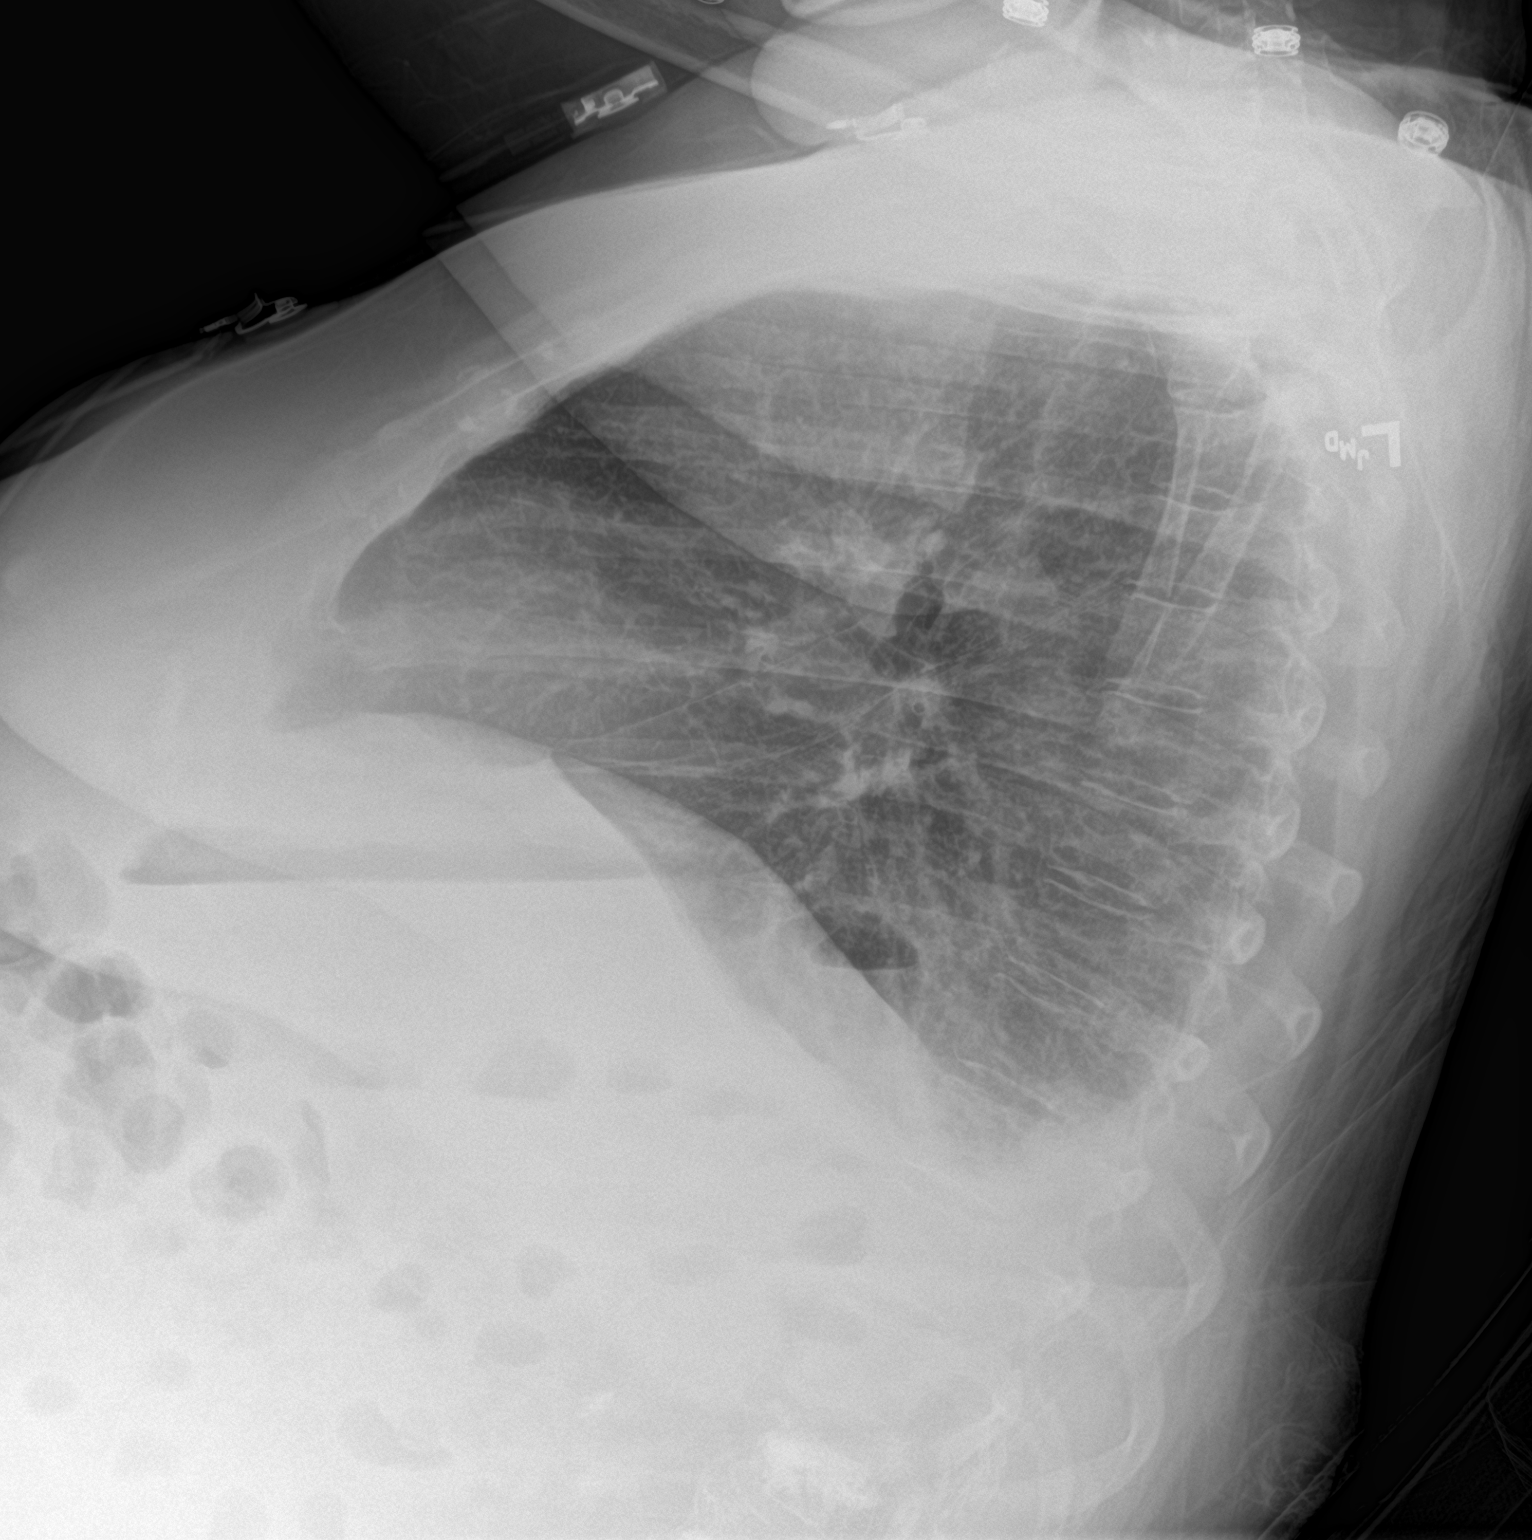

[chest ap]
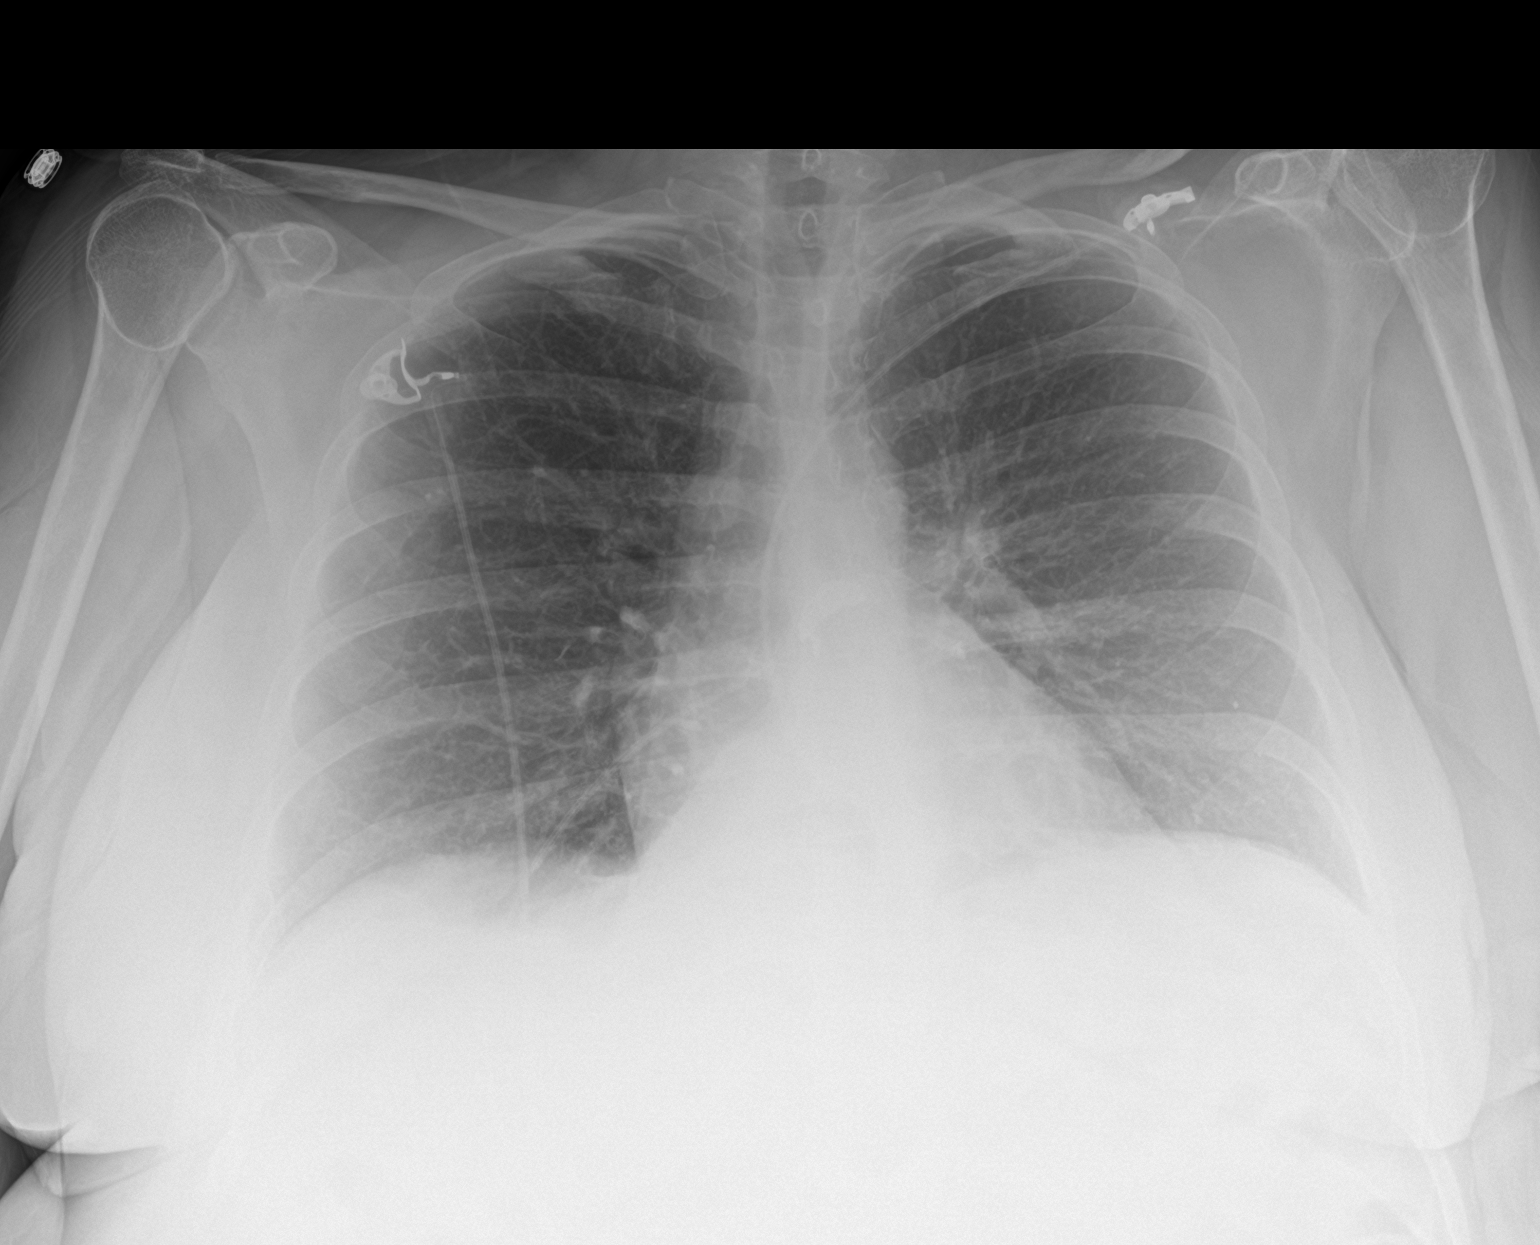

[2 of 2 positions shown; findings below may reference images not displayed]

FINDINGS: There is focal opacity in the posterior left base seen only on the
lateral view. Lungs elsewhere appear clear. Heart size and pulmonary
vascularity are normal. No adenopathy. There is a hiatal hernia. No
evident bone lesions. There is evidence for lumbar kyphoplasties.
IMPRESSION: Small area of infiltrate posterior left base seen only on the
lateral view. Lungs elsewhere clear.

Heart size within normal limits.  No adenopathy.

Hiatal hernia present.
